# Patient Record
Sex: Male | Born: 1950 | Race: White | Hispanic: No | Marital: Married | Smoking: Never smoker
Health system: Southern US, Community
[De-identification: ages and names within clinical notes are randomized; demographics above are authoritative.]

## PROBLEM LIST (undated history)

## (undated) DIAGNOSIS — E7800 Pure hypercholesterolemia, unspecified: Secondary | ICD-10-CM

## (undated) DIAGNOSIS — C801 Malignant (primary) neoplasm, unspecified: Secondary | ICD-10-CM

## (undated) DIAGNOSIS — I2510 Atherosclerotic heart disease of native coronary artery without angina pectoris: Secondary | ICD-10-CM

## (undated) DIAGNOSIS — G4730 Sleep apnea, unspecified: Secondary | ICD-10-CM

## (undated) DIAGNOSIS — I509 Heart failure, unspecified: Secondary | ICD-10-CM

## (undated) DIAGNOSIS — I639 Cerebral infarction, unspecified: Secondary | ICD-10-CM

## (undated) DIAGNOSIS — E119 Type 2 diabetes mellitus without complications: Secondary | ICD-10-CM

## (undated) DIAGNOSIS — I499 Cardiac arrhythmia, unspecified: Secondary | ICD-10-CM

## (undated) DIAGNOSIS — I10 Essential (primary) hypertension: Secondary | ICD-10-CM

## (undated) DIAGNOSIS — K219 Gastro-esophageal reflux disease without esophagitis: Secondary | ICD-10-CM

## (undated) HISTORY — PX: CHOLECYSTECTOMY: SHX55

## (undated) HISTORY — PX: RENAL CYST EXCISION: SUR506

## (undated) HISTORY — PX: KNEE ARTHROSCOPY: SUR90

## (undated) HISTORY — DX: Cardiac arrhythmia, unspecified: I49.9

## (undated) HISTORY — DX: Sleep apnea, unspecified: G47.30

## (undated) HISTORY — DX: Atherosclerotic heart disease of native coronary artery without angina pectoris: I25.10

---

## 1999-09-20 ENCOUNTER — Encounter: Payer: Self-pay | Admitting: *Deleted

## 1999-09-20 ENCOUNTER — Ambulatory Visit (HOSPITAL_COMMUNITY): Admission: RE | Admit: 1999-09-20 | Discharge: 1999-09-20 | Payer: Self-pay | Admitting: *Deleted

## 2004-03-31 ENCOUNTER — Ambulatory Visit: Payer: Self-pay | Admitting: Urology

## 2004-04-07 ENCOUNTER — Ambulatory Visit: Payer: Self-pay | Admitting: Urology

## 2004-09-05 ENCOUNTER — Ambulatory Visit: Payer: Self-pay | Admitting: Urology

## 2005-03-20 ENCOUNTER — Ambulatory Visit: Payer: Self-pay | Admitting: Urology

## 2005-12-18 ENCOUNTER — Ambulatory Visit: Payer: Self-pay | Admitting: Urology

## 2006-04-04 ENCOUNTER — Ambulatory Visit: Payer: Self-pay | Admitting: Urology

## 2006-10-17 ENCOUNTER — Ambulatory Visit: Payer: Self-pay | Admitting: Gastroenterology

## 2007-09-08 ENCOUNTER — Emergency Department: Payer: Self-pay | Admitting: Unknown Physician Specialty

## 2007-09-08 ENCOUNTER — Other Ambulatory Visit: Payer: Self-pay

## 2007-09-19 ENCOUNTER — Ambulatory Visit: Payer: Self-pay | Admitting: General Practice

## 2008-07-23 ENCOUNTER — Ambulatory Visit: Payer: Self-pay | Admitting: Urology

## 2009-06-22 ENCOUNTER — Ambulatory Visit: Payer: Self-pay | Admitting: Urology

## 2009-06-23 ENCOUNTER — Ambulatory Visit: Payer: Self-pay | Admitting: Urology

## 2010-01-05 ENCOUNTER — Ambulatory Visit: Payer: Self-pay | Admitting: Urology

## 2010-01-18 ENCOUNTER — Ambulatory Visit: Payer: Self-pay | Admitting: Urology

## 2010-01-19 LAB — PATHOLOGY REPORT

## 2010-10-27 ENCOUNTER — Observation Stay: Payer: Self-pay | Admitting: Specialist

## 2010-11-03 ENCOUNTER — Ambulatory Visit: Payer: Self-pay | Admitting: General Practice

## 2012-02-03 ENCOUNTER — Ambulatory Visit: Payer: Self-pay | Admitting: Medical

## 2012-02-28 ENCOUNTER — Ambulatory Visit: Payer: Self-pay | Admitting: General Practice

## 2012-06-29 ENCOUNTER — Emergency Department: Payer: Self-pay | Admitting: Emergency Medicine

## 2013-01-15 ENCOUNTER — Ambulatory Visit: Payer: Self-pay | Admitting: Physician Assistant

## 2013-01-15 LAB — CBC WITH DIFFERENTIAL/PLATELET
Basophil #: 0.1 10*3/uL (ref 0.0–0.1)
Basophil %: 0.9 %
Eosinophil #: 0.2 10*3/uL (ref 0.0–0.7)
Eosinophil %: 3.2 %
HCT: 45.4 % (ref 40.0–52.0)
HGB: 16 g/dL (ref 13.0–18.0)
Lymphocyte #: 1.8 10*3/uL (ref 1.0–3.6)
Lymphocyte %: 24.8 %
MCH: 31.5 pg (ref 26.0–34.0)
MCHC: 35.2 g/dL (ref 32.0–36.0)
MCV: 89 fL (ref 80–100)
Monocyte #: 0.6 x10 3/mm (ref 0.2–1.0)
Monocyte %: 8.2 %
Neutrophil #: 4.5 10*3/uL (ref 1.4–6.5)
Neutrophil %: 62.9 %
Platelet: 166 10*3/uL (ref 150–440)
RBC: 5.08 10*6/uL (ref 4.40–5.90)
RDW: 13.3 % (ref 11.5–14.5)
WBC: 7.1 10*3/uL (ref 3.8–10.6)

## 2013-01-15 LAB — URINALYSIS, COMPLETE
Bacteria: NEGATIVE
Bilirubin,UR: NEGATIVE
Glucose,UR: NEGATIVE mg/dL (ref 0–75)
Ketone: NEGATIVE
Nitrite: NEGATIVE
Ph: 6 (ref 4.5–8.0)
Protein: NEGATIVE
RBC,UR: NONE SEEN /HPF (ref 0–5)
Specific Gravity: 1.005 (ref 1.003–1.030)
Squamous Epithelial: NONE SEEN

## 2013-01-15 LAB — COMPREHENSIVE METABOLIC PANEL
Albumin: 4.3 g/dL (ref 3.4–5.0)
Alkaline Phosphatase: 71 U/L (ref 50–136)
Anion Gap: 11 (ref 7–16)
BUN: 17 mg/dL (ref 7–18)
Bilirubin,Total: 0.5 mg/dL (ref 0.2–1.0)
Calcium, Total: 9 mg/dL (ref 8.5–10.1)
Chloride: 105 mmol/L (ref 98–107)
Co2: 25 mmol/L (ref 21–32)
Creatinine: 1.01 mg/dL (ref 0.60–1.30)
EGFR (African American): >60
EGFR (Non-African Amer.): >60
Glucose: 97 mg/dL (ref 65–99)
Osmolality: 283 (ref 275–301)
Potassium: 3.8 mmol/L (ref 3.5–5.1)
SGOT(AST): 18 U/L (ref 15–37)
SGPT (ALT): 23 U/L (ref 12–78)
Sodium: 141 mmol/L (ref 136–145)
Total Protein: 7.6 g/dL (ref 6.4–8.2)

## 2013-01-17 LAB — URINE CULTURE

## 2013-10-20 ENCOUNTER — Inpatient Hospital Stay: Payer: Self-pay | Admitting: Surgery

## 2013-10-20 LAB — BASIC METABOLIC PANEL
Anion Gap: 7 (ref 7–16)
BUN: 17 mg/dL (ref 7–18)
Calcium, Total: 8.6 mg/dL (ref 8.5–10.1)
Chloride: 104 mmol/L (ref 98–107)
Co2: 27 mmol/L (ref 21–32)
Creatinine: 0.94 mg/dL (ref 0.60–1.30)
EGFR (African American): >60
EGFR (Non-African Amer.): >60
Glucose: 131 mg/dL — ABNORMAL HIGH (ref 65–99)
Osmolality: 279 (ref 275–301)
Potassium: 4.9 mmol/L (ref 3.5–5.1)
Sodium: 138 mmol/L (ref 136–145)

## 2013-10-20 LAB — CBC
HCT: 46.7 % (ref 40.0–52.0)
HGB: 16.4 g/dL (ref 13.0–18.0)
MCH: 32.2 pg (ref 26.0–34.0)
MCHC: 35 g/dL (ref 32.0–36.0)
MCV: 92 fL (ref 80–100)
Platelet: 186 10*3/uL (ref 150–440)
RBC: 5.08 10*6/uL (ref 4.40–5.90)
RDW: 13 % (ref 11.5–14.5)
WBC: 6.7 10*3/uL (ref 3.8–10.6)

## 2013-10-20 LAB — HEPATIC FUNCTION PANEL A (ARMC)
Albumin: 3.7 g/dL (ref 3.4–5.0)
Alkaline Phosphatase: 74 U/L
Bilirubin, Direct: 0.4 mg/dL — ABNORMAL HIGH (ref 0.00–0.20)
Bilirubin,Total: 2.8 mg/dL — ABNORMAL HIGH (ref 0.2–1.0)
SGOT(AST): 221 U/L — ABNORMAL HIGH (ref 15–37)
SGPT (ALT): 141 U/L — ABNORMAL HIGH (ref 12–78)
Total Protein: 7.5 g/dL (ref 6.4–8.2)

## 2013-10-20 LAB — TROPONIN I: Troponin-I: <0.02 ng/mL

## 2013-10-20 LAB — LIPASE, BLOOD: Lipase: 200 U/L (ref 73–393)

## 2013-10-21 LAB — CBC WITH DIFFERENTIAL/PLATELET
BASOS PCT: 0.8 %
Basophil #: 0 10*3/uL (ref 0.0–0.1)
EOS ABS: 0.2 10*3/uL (ref 0.0–0.7)
Eosinophil %: 3.8 %
HCT: 44.2 % (ref 40.0–52.0)
HGB: 15 g/dL (ref 13.0–18.0)
Lymphocyte #: 1 10*3/uL (ref 1.0–3.6)
Lymphocyte %: 19 %
MCH: 31.3 pg (ref 26.0–34.0)
MCHC: 33.9 g/dL (ref 32.0–36.0)
MCV: 92 fL (ref 80–100)
MONO ABS: 0.5 x10 3/mm (ref 0.2–1.0)
Monocyte %: 10.2 %
NEUTROS ABS: 3.4 10*3/uL (ref 1.4–6.5)
Neutrophil %: 66.2 %
PLATELETS: 168 10*3/uL (ref 150–440)
RBC: 4.79 10*6/uL (ref 4.40–5.90)
RDW: 12.9 % (ref 11.5–14.5)
WBC: 5.2 10*3/uL (ref 3.8–10.6)

## 2013-10-21 LAB — COMPREHENSIVE METABOLIC PANEL
ALBUMIN: 3.2 g/dL — AB (ref 3.4–5.0)
ANION GAP: 6 — AB (ref 7–16)
Alkaline Phosphatase: 114 U/L
BILIRUBIN TOTAL: 3.8 mg/dL — AB (ref 0.2–1.0)
BUN: 12 mg/dL (ref 7–18)
CREATININE: 1.1 mg/dL (ref 0.60–1.30)
Calcium, Total: 9.2 mg/dL (ref 8.5–10.1)
Chloride: 105 mmol/L (ref 98–107)
Co2: 30 mmol/L (ref 21–32)
EGFR (Non-African Amer.): >60
Glucose: 107 mg/dL — ABNORMAL HIGH (ref 65–99)
Osmolality: 281 (ref 275–301)
Potassium: 3.8 mmol/L (ref 3.5–5.1)
SGOT(AST): 116 U/L — ABNORMAL HIGH (ref 15–37)
SGPT (ALT): 150 U/L — ABNORMAL HIGH (ref 12–78)
SODIUM: 141 mmol/L (ref 136–145)
TOTAL PROTEIN: 6.7 g/dL (ref 6.4–8.2)

## 2013-10-22 LAB — COMPREHENSIVE METABOLIC PANEL
Albumin: 3.1 g/dL — ABNORMAL LOW (ref 3.4–5.0)
Alkaline Phosphatase: 103 U/L
Anion Gap: 9 (ref 7–16)
BUN: 13 mg/dL (ref 7–18)
Bilirubin,Total: 1.4 mg/dL — ABNORMAL HIGH (ref 0.2–1.0)
CHLORIDE: 104 mmol/L (ref 98–107)
CREATININE: 0.89 mg/dL (ref 0.60–1.30)
Calcium, Total: 9 mg/dL (ref 8.5–10.1)
Co2: 28 mmol/L (ref 21–32)
EGFR (African American): >60
EGFR (Non-African Amer.): >60
Glucose: 93 mg/dL (ref 65–99)
Osmolality: 281 (ref 275–301)
Potassium: 3.7 mmol/L (ref 3.5–5.1)
SGOT(AST): 52 U/L — ABNORMAL HIGH (ref 15–37)
SGPT (ALT): 96 U/L — ABNORMAL HIGH (ref 12–78)
Sodium: 141 mmol/L (ref 136–145)
Total Protein: 6.4 g/dL (ref 6.4–8.2)

## 2013-10-22 LAB — AMYLASE: Amylase: 62 U/L (ref 25–115)

## 2013-10-23 LAB — COMPREHENSIVE METABOLIC PANEL
ALBUMIN: 3 g/dL — AB (ref 3.4–5.0)
AST: 63 U/L — AB (ref 15–37)
Alkaline Phosphatase: 87 U/L
Anion Gap: 6 — ABNORMAL LOW (ref 7–16)
BILIRUBIN TOTAL: 0.8 mg/dL (ref 0.2–1.0)
BUN: 14 mg/dL (ref 7–18)
CO2: 30 mmol/L (ref 21–32)
Calcium, Total: 8.9 mg/dL (ref 8.5–10.1)
Chloride: 103 mmol/L (ref 98–107)
Creatinine: 0.95 mg/dL (ref 0.60–1.30)
EGFR (African American): >60
GLUCOSE: 127 mg/dL — AB (ref 65–99)
Osmolality: 280 (ref 275–301)
Potassium: 4 mmol/L (ref 3.5–5.1)
SGPT (ALT): 96 U/L — ABNORMAL HIGH (ref 12–78)
SODIUM: 139 mmol/L (ref 136–145)
TOTAL PROTEIN: 6.2 g/dL — AB (ref 6.4–8.2)

## 2013-10-23 LAB — CBC WITH DIFFERENTIAL/PLATELET
Basophil #: 0 10*3/uL (ref 0.0–0.1)
Basophil %: 0 %
EOS PCT: 0 %
Eosinophil #: 0 10*3/uL (ref 0.0–0.7)
HCT: 41.5 % (ref 40.0–52.0)
HGB: 14.4 g/dL (ref 13.0–18.0)
LYMPHS ABS: 0.8 10*3/uL — AB (ref 1.0–3.6)
Lymphocyte %: 8.6 %
MCH: 31.9 pg (ref 26.0–34.0)
MCHC: 34.6 g/dL (ref 32.0–36.0)
MCV: 92 fL (ref 80–100)
Monocyte #: 0.7 x10 3/mm (ref 0.2–1.0)
Monocyte %: 7.3 %
Neutrophil #: 7.9 10*3/uL — ABNORMAL HIGH (ref 1.4–6.5)
Neutrophil %: 84.1 %
PLATELETS: 174 10*3/uL (ref 150–440)
RBC: 4.5 10*6/uL (ref 4.40–5.90)
RDW: 13.1 % (ref 11.5–14.5)
WBC: 9.4 10*3/uL (ref 3.8–10.6)

## 2013-10-23 LAB — AMYLASE: Amylase: 40 U/L (ref 25–115)

## 2013-10-23 LAB — PATHOLOGY REPORT

## 2013-12-22 ENCOUNTER — Ambulatory Visit: Payer: Self-pay | Admitting: Oncology

## 2013-12-22 DIAGNOSIS — C801 Malignant (primary) neoplasm, unspecified: Secondary | ICD-10-CM | POA: Insufficient documentation

## 2013-12-22 DIAGNOSIS — N400 Enlarged prostate without lower urinary tract symptoms: Secondary | ICD-10-CM | POA: Insufficient documentation

## 2013-12-22 DIAGNOSIS — N281 Cyst of kidney, acquired: Secondary | ICD-10-CM | POA: Insufficient documentation

## 2013-12-22 DIAGNOSIS — D408 Neoplasm of uncertain behavior of other specified male genital organs: Secondary | ICD-10-CM | POA: Insufficient documentation

## 2013-12-22 DIAGNOSIS — N401 Enlarged prostate with lower urinary tract symptoms: Secondary | ICD-10-CM | POA: Insufficient documentation

## 2013-12-22 DIAGNOSIS — Z87442 Personal history of urinary calculi: Secondary | ICD-10-CM | POA: Insufficient documentation

## 2014-01-11 DIAGNOSIS — C609 Malignant neoplasm of penis, unspecified: Secondary | ICD-10-CM | POA: Insufficient documentation

## 2014-08-22 NOTE — Consult Note (Signed)
PATIENT NAME:  Jermaine Kramer, Jermaine Kramer MR#:  [id number] DATE OF BIRTH:  05-26-1950  DATE OF CONSULTATION:  10/21/2013  REFERRING PHYSICIAN:  Dr. Leanora Cover CONSULTING PHYSICIAN:  Lucilla Lame, MD / Andria Meuse, NP  PRIMARY CARE PHYSICIAN: Dr. Burt Ek   REASON FOR CONSULTATION: Suspected choledocholithiasis.   HISTORY OF PRESENT ILLNESS: Jermaine Kramer is a 64 year old Caucasian male who was in his usual state of health until he developed severe right upper quadrant pain, which radiated across his stomach, 2 days ago. This was not associated with nausea or vomiting, however, the pain was in his upper abdomen, radiated to his lower entire abdomen. Pain was 9 out of 10 on pain scale. He did have a large loose bowel movement and the pain seemed to go away after that. He had a CT scan of abdomen and pelvis with contrast yesterday which showed a common bile duct at 4 mm, large gallstones up to 3 cm in diameter, a 2 mm nonobstructing right kidney calculus,  large prostate, small hiatal hernia, low-grade stranding of the central mesentery and atherosclerosis. His liver function tests were normal in September 2014. His total bilirubin went from 2.8 yesterday to 3.2 today, alkaline phosphatase from 74 to 114, AST from 221 down to 116 and ALT from 141 up to 150. He had a normal CBC and negative troponin. He denies any fever, chills. His weight has been stable and his appetite has been fine up until this acute illness. He denies any clay-colored stools, jaundice, pruritus or rectal bleeding or melena.   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 He had a colonoscopy by Dr. Allen Norris where he had colitis at the ileocecal valve with normal biopsies and hyperplastic polyps in June of 2008 by Dr. Allen Norris. He has had a Port-A-Cath, which has since been removed.   MEDICATIONS PRIOR TO ADMISSION: Aspirin 325 mg daily,  hydrochlorothiazide/lisinopril 12.5/20 mg daily, Klor-Con M10 once daily, metformin 1 gram b.i.d., Onglyza 2.5 mg daily, pravastatin 20 mg at bedtime, Prilosec 20 mg daily.   ALLERGIES: No known drug allergies.   FAMILY HISTORY: There is no known family history of chronic GI problems or liver disease. No family history of colorectal carcinoma.   SOCIAL HISTORY: He is retired from [employer]. He has 2 daughters and 1 son all of whom are healthy. He is married. He denies any tobacco, alcohol or illicit drug use.   REVIEW OF SYSTEMS: See HPI, otherwise negative 12 point review of systems.  PHYSICAL EXAMINATION: VITAL SIGNS: Temperature 97.8, pulse 71, respirations 17, blood pressure 120/78, O2 sat 95% on room air.  GENERAL: He is a well-developed, well-nourished Caucasian male who is alert, oriented, pleasant, and cooperative, in no acute distress. His wife is at the bedside.  HEENT: Sclerae with mild icterus. Conjunctivae pale. Oropharynx pink and moist without any lesions.  NECK: Supple without mass or thyromegaly.  CHEST: Heart regular rate and rhythm. Normal S1, S2. without any murmurs, clicks, rubs, or gallops.  LUNGS: Clear to auscultation bilaterally.  ABDOMEN: Protuberant. Positive bowel sounds x4. No bruits auscultated. Abdomen is soft, nontender, nondistended, without palpable mass or hepatosplenomegaly. No rebound tenderness or guarding. Negative Murphy's sign.  EXTREMITIES: Without clubbing or edema bilaterally.  PSYCHIATRIC: Alert, cooperative, normal mood and affect.  NEUROLOGIC: Grossly intact.  MUSCULOSKELETAL: Good equal movement and strength bilaterally.  SKIN: Pale, warm and dry.  LABORATORY STUDIES: See HPI.  IMPRESSION: Jermaine Kramer is a very pleasant 64 year old Caucasian male admitted with biliary colic and found to have multiple large gallstones. His bilirubin has increased to 3.2 today as well as his alkaline phosphatase so there is concern  for choledocholithiasis. He may have intermittent obstruction from large cholelithiasis in his gallbladder. MRCP is pending.   PLAN: 1.  If MRCP is positive revealing dilation or stone, ERCP with Dr. Allen Norris in the near future. I have discussed risks and benefits of procedure which include but are not limited to bleeding, infection, perforation, drug reaction, and pancreatitis. He agrees with this plan and consent will be obtained if MRI is abnormal.  2.  Continue supportive measures.  3.  Eventual cholecystectomy per surgery.   Thank you for allowing [place] to participate in his care. ____________________________ Andria Meuse, NP klj:sb D: 10/21/2013 14:01:32 ET T: 10/21/2013 14:19:00 ET JOB#: [id number]  cc: Andria Meuse, NP, <Dictator> Nelda Severe. Burt Ek, [place] SIGNED 10/31/2013 16:47

## 2014-08-22 NOTE — Op Note (Signed)
PATIENT NAME:  Jermaine Kramer, Jermaine Kramer MR#:  [id number] DATE OF BIRTH:  1951/01/06  DATE OF PROCEDURE:  10/22/2013  PREOPERATIVE DIAGNOSIS: Symptomatic cholelithiasis (passed common bile duct stone).   POSTOPERATIVE DIAGNOSIS: Symptomatic cholelithiasis (passed common bile duct stone).   SURGEON: Consuela Mimes, M.D.   ANESTHESIA: General.   OPERATION PERFORMED: Laparoscopic cholecystectomy with intraoperative cholangiogram.   COMPLICATION: Veress needle puncture of small bowel mesentery.   PROCEDURE IN DETAIL: The patient was placed supine on the operating room table and prepped and draped in the usual sterile fashion. A 15 mmHg CO2 pneumoperitoneum was created with the Veress needle in the supraumbilical position, and this was replaced with a 5 mm trocar and a 30-degree angled laparoscope. Remaining trocars were placed under direct visualization. There was a small amount of blood under the umbilical trocar site in the omentum, and the omentum was pulled back and it was apparent that the Veress needle had initially penetrated the omentum and a full-thickness single layer of small bowel mesentery. The amount of blood was only about 1 or 2 mL, but there was a small amount of CO2 that had been insufflated into the mesentery and it came around one of the small intestinal loops just where the mesentery met the small intestine itself. There was no evidence of succus entericus, and I investigated this area by moving the loop back and forth and looking at the lower portion of small bowel mesenteric root, and the injury appeared to be minor in that there was no actual intestinal injury and no hematoma formation and the bleeding was minimal and not active. Therefore, the omentum was placed back over top of this area of small bowel mesentery and small bowel and the procedure was continued laparoscopically. There were multiple adhesions from the parietal surface of the liver and the diaphragm and anterior abdominal  wall from the patient's previous right kidney surgery, and these were taken down with the electrocautery so that the liver could be mobilized enough to mobilize the gallbladder for visualization. The fundus of the gallbladder was then retracted superiorly and ventrally, and the infundibulum of the gallbladder was dissected out from surrounding fatty tissue near the triangle of Calot, and a posterior cystic artery was doubly clipped and divided and the anterior cystic artery was then doubly clipped and divided and a single clip was placed on the gallbladder and cystic duct junction and a cystic ductotomy was performed through which a Reddick catheter was placed and an intraoperative cholangiogram was performed under fluoroscopy. Although there was some leakage of contrast through the cystic duct past the balloon, there was contrast seen in the intrahepatic ductal system, the common hepatic duct and the common bile duct all the way down to a normal sphincter of Oddi and little bit of wisps of contrast could be seen entering the duodenum. The entire biliary tree was normal with no evidence of filling defects and was also normal in caliber. Therefore, it was felt that the patient likely had passed a common bile duct stone the day prior to admission to the hospital which was 2 days ago. The cholangiogram catheter was removed, and the cystic duct stump was closed with 2 firings of the hemoclip device, and then the gallbladder was removed from the liver bed with the electrocautery. There was an additional posterior cystic artery that was very close to the liver bed, and this was controlled with 2 additional hemoclips and the gallbladder was then placed in an Endo Catch bag and  extracted from the abdomen via the epigastric midline port. This port site fascia required significant enlargement due to the size of the largest gallstone. The peritoneum was temporarily desufflated while I closed the linea alba in the epigastrium  with a running 0 PDS suture, and then the peritoneum was reinsufflated and reinspected and the right upper quadrant was irrigated with copious amounts of warm normal saline, all of which was suctioned completely clear. From the patient's previous right kidney surgery, he either had a true hernia or what was more likely a denervation and eventration of the abdominal wall in that area. All of the effluent was suctioned clear, and the area of dissection was reinspected and the clips on the cystic duct were secure, as were the ones on the various branches of the cystic artery. There was no evidence of bleeding, and, therefore, the peritoneum was desufflated and decannulated, and all 4 skin sites were closed with subcuticular 5-0 Monocryl and suture strips. The patient tolerated the procedure well and the complication was minimal and inconsequential and is mentioned in the heading of this operative report.    ____________________________ Consuela Mimes, MD wfm:gb D: 10/22/2013 12:37:16 ET T: 10/23/2013 00:20:01 ET JOB#: [id number]  cc: Consuela Mimes, MD, <Dictator> Consuela Mimes MD ELECTRONICALLY SIGNED 10/23/2013 7:38

## 2014-08-22 NOTE — H&P (Signed)
Subjective/Chief Complaint RUQ pain   History of Present Illness 24 hrs of ruq pain after fatty meal. first episode n/v,  no f/c pain now resolved no jaundice, had loose stools   Past History PSH port, rt kidney cyst drainage PMH AML, CVA ?, HTN DM   Past Medical Health Hypertension, Diabetes Mellitus, Cancer, Stroke   Past Med/Surgical Hx:  Sleep Apnea:   Kidney Cyst:   Kidney Stones:   Leukemia:   Diabetes Mellitus, Type II (NIDD):   GERD - Esophageal Reflux:   Hypertension:   Knee surgery:   CYST REMOVED FROM RIGHT KIDNEY:   ALLERGIES:  No Known Allergies:   Family and Social History:  Family History Non-Contributory   Social History negative tobacco, negative ETOH, retired [employer]   Review of Systems:  Fever/Chills No   Cough No   Abdominal Pain Yes   Diarrhea Yes   Constipation No   Nausea/Vomiting Yes   SOB/DOE No   Chest Pain No   Dysuria No   Tolerating Diet No  Nauseated   Medications/Allergies Reviewed Medications/Allergies reviewed   Physical Exam:  GEN no acute distress, obese   HEENT pink conjunctivae   NECK supple   RESP normal resp effort  clear BS  no use of accessory muscles   CARD regular rate   ABD positive tenderness  positive hernia  UH, reducible nontender; min RUQ tenderness   LYMPH negative neck   EXTR negative edema   SKIN normal to palpation   PSYCH alert, A+O to time, place, person, good insight   Lab Results: Hepatic:  21-Jun-15 23:25   Bilirubin, Total  2.8  Bilirubin, Direct  0.40  Alkaline Phosphatase 74 (45-117 NOTE: New Reference Range 03/21/13)  SGPT (ALT)  141  SGOT (AST)  221  Total Protein, Serum 7.5  Albumin, Serum 3.7  Routine Chem:  21-Jun-15 23:25   Result Comment AST/DIRECT BILIRUBIN - Slight hemolysis, interpret results with  - caution.  Result(s) reported on 20 Oct 2013 at 01:46AM.  Result Comment POTASSIUM - Slight hemolysis, interpret  results with  - caution.  Result(s) reported on 20 Oct 2013 at 12:08AM.  Lipase 200 (Result(s) reported on 20 Oct 2013 at 01:41AM.)  Glucose, Serum  131  BUN 17  Creatinine (comp) 0.94  Sodium, Serum 138  Potassium, Serum 4.9  Chloride, Serum 104  CO2, Serum 27  Calcium (Total), Serum 8.6  Anion Gap 7  Osmolality (calc) 279  eGFR (African American) >60  eGFR (Non-African American) >60 (eGFR values <58m/min/1.73 m2 may be an indication of chronic kidney disease (CKD). Calculated eGFR is useful in patients with stable renal function. The eGFR calculation will not be reliable in acutely ill patients when serum creatinine is changing rapidly. It is not useful in  patients on dialysis. The eGFR calculation may not be applicable to patients at the low and high extremes of body sizes, pregnant women, and vegetarians.)  Cardiac:  21-Jun-15 23:25   Troponin I < 0.02 (0.00-0.05 0.05 ng/mL or less: NEGATIVE  Repeat testing in 3-6 hrs  if clinically indicated. >0.05 ng/mL: POTENTIAL  MYOCARDIAL INJURY. Repeat  testing in 3-6 hrs if  clinically indicated. NOTE: An increase or decrease  of 30% or more on serial  testing suggests a  clinically important change)  Routine Hem:  21-Jun-15 23:25   WBC (CBC) 6.7  RBC (CBC) 5.08  Hemoglobin (CBC) 16.4  Hematocrit (CBC) 46.7  Platelet Count (CBC)  186 (Result(s) reported on 20 Oct 2013 at 12:00AM.)  MCV 92  MCH 32.2  MCHC 35.0  RDW 13.0   Radiology Results: [place]:    22-Jun-15 04:01, US Abdomen Limited Survey  US Abdomen Limited Survey  REASON FOR EXAM:    abd pain, transaminitis  COMMENTS:   Body Site: Gallbladder, Liver, Common Bile Duct    PROCEDURE: [place]  - US ABDOMEN LIMITED SURVEY  - Oct 20 2013  4:01AM     CLINICAL DATA:  Abdominal pain.  Transaminitis.    EXAM:  US ABDOMEN LIMITED - RIGHT UPPER QUADRANT    COMPARISON:  CT abdomen and pelvis 10/20/2013    FINDINGS:  Gallbladder:  Multiple stones in the gallbladder,  largest measuring 2.3 cm. No  gallbladder wall thickening. Murphy's sign is negative.    Common bile duct:    Diameter: 3.7 mm, normal    Liver:    Mild diffusely increased hepatic parenchymal echotexture suggesting  mild diffuse fatty infiltration. No focal liver lesions are  identified.     IMPRESSION:  Cholelithiasis without signs of cholecystitis.  Electronically Signed    By: Lucienne Capers M.D.    On: 10/20/2013 04:03         Verified By: Neale Burly, M.D.,  CT:    22-Jun-15 02:08, CT Abdomen and Pelvis With Contrast  CT Abdomen and Pelvis With Contrast  REASON FOR EXAM:    (1) crampy abd pain and diarrhea; (2) crampy abd pain   and diarrhea  COMMENTS:   May transport without cardiac monitor    PROCEDURE: CT  - CT ABDOMEN / PELVIS  W  - Oct 20 2013  2:08AM     CLINICAL DATA:  Epigastric pain.  Diarrhea.    EXAM:  CT ABDOMEN AND PELVIS WITH CONTRAST    TECHNIQUE:  Multidetector CT imaging of the abdomen and pelvis was performed  using the standard protocol following bolus administration of  intravenous contrast.  CONTRAST:  100 cc Isovue 300    COMPARISON:  10/19/2013    FINDINGS:  Subsegmental atelectasis noted in the posterior basal segments of  both lower lobes.    Small hiatal hernia. Larger gallstones measuring up to 3 cm in  diameter.    Screen and pancreas unremarkable. Fullness of the medial limb of the  right adrenal gland measuring up to 9 mm in thickness.    Bilateral upper pole renal cysts. 2 mm nonobstructive right kidney  lower pole calculus. No ureteral or bladder calculus.    Low grade edema in the central mesentery. Appendix normal. No  pathologic upper abdominal adenopathy is observed. No pathologic  pelvic adenopathy is observed. Aortoiliac atherosclerotic vascular  disease. Enlarged prostate gland, 5.5 by 4.9 cm.    No dilated bowel.  The colon is relatively empty.     IMPRESSION:  1. Large gallstones.  2. 2 mm  nonobstructive right kidney lower pole calculus.  3. Low grade stranding in the central mesentery, as referenced on  the prior CT scan from 2011, probably postinflammatory.  4. Atherosclerosis.  5. Enlarged prostate gland.  6. Small hiatal hernia.      Electronically Signed    By: Sherryl Barters M.D.    On: 10/20/2013 02:20         Verified By: Carron Curie, M.D.,    Assessment/Admission Diagnosis choledocholith with DM rec admit, IV abx, recheck labs lap chole with grams will discuss timing with Dr Leanora Cover   Electronic Signatures:  [name] (MD)  (Signed 22-Jun-15 06:19)  Authored: CHIEF COMPLAINT and HISTORY, PAST MEDICAL/SURGIAL HISTORY, ALLERGIES, FAMILY AND SOCIAL HISTORY, REVIEW OF SYSTEMS, PHYSICAL EXAM, LABS, Radiology, ASSESSMENT AND PLAN   Last Updated: 22-Jun-15 06:19 by [name] (MD)

## 2014-08-22 NOTE — Consult Note (Signed)
Brief Consult Note: Diagnosis: suspected choledocholithiasis.   Patient was seen by consultant.   Comments: Jermaine Kramer is a very pleasant 64 y/o caucasian male admitted with biliary colic found to have multiple large gallstones.  His bilirubin has increased to 3.2 today as well as his alkaline phosphatase so there is concern for choledocholithiasis.  He may have intermittent obstruction from large cholelithiasis in his gallbladder.  MRCP is pending.  Plan: 1) If MRCP positive for biliary dilation or stone, ERCP with Dr Allen Norris.  Discussed risks/benefits of procedure which include but are not limited to pancreatitis, bleeding, infection, perforation & drug reaction.  Patient agrees with this plan & consent will be obtained. 2) Continue supportive measures 3) eventual cholecystectomy per surgery Thanks for allowing [place] to participate in his care.  Please see full dictated note. #[id number].  Electronic Signatures: Andria Meuse (NP)  (Signed 23-Jun-15 14:01)  Authored: Brief Consult Note   Last Updated: 23-Jun-15 14:01 by Andria Meuse (NP)

## 2014-08-22 NOTE — Discharge Summary (Signed)
PATIENT NAME:  Jermaine Kramer, Jermaine Kramer MR#:  [id number] DATE OF BIRTH:  1951-04-02  DATE OF ADMISSION:  10/20/2013 DATE OF DISCHARGE:  10/23/2013  PRINCIPLE DIAGNOSIS: Symptomatic cholelithiasis (likely passed common bile duct stone).   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   HOSPITAL COURSE: The patient was admitted to the hospital and had a rising bilirubin and abnormal hepatic transaminases, but he had a gallbladder with multiple stones, the largest being 23 mm with no gallbladder wall thickening or pericholecystic fluid on ultrasound and a normal size common bile duct at 4 mm. He underwent M.R.C.P. which was essentially normal, and the following morning his bilirubin dropped from a high of 3.8 down to 1.4, and his SGOT and SGPT dropped as well and, therefore, he was taken to the Operating Room where he underwent laparoscopic cholecystectomy with intraoperative cholangiogram. The cholangiogram was essentially normal. The following day, he was afebrile with stable vital signs and had slept well. He was having minimal abdominal pain that was controlled with Norco. He was eating. His abdomen was protuberant and obese, but at baseline and was completely nontender and nondistended and, therefore, he was discharged home with a prescription for Norco and instructed to call for fever or increased abdominal pain or other gastrointestinal symptoms and, otherwise, to see me in the office in 1-2 weeks.    ____________________________ Consuela Mimes, MD wfm:ts D: 10/23/2013 07:35:24 ET T: 10/23/2013 13:24:54 ET JOB#: [id number]  cc: Consuela Mimes, MD, <Dictator> Consuela Mimes MD ELECTRONICALLY SIGNED 10/24/2013 9:37

## 2014-08-22 NOTE — H&P (Signed)
PATIENT NAME:  Jermaine Kramer, Jermaine Kramer MR#:  [id number] DATE OF BIRTH:  1950-07-01  DATE OF ADMISSION:  10/19/2013  CHIEF COMPLAINT: Right upper quadrant pain.  HISTORY OF PRESENT ILLNESS: This is a patient with approximately 18 hours of abdominal pain that started after lunch yesterday, He has never had an episode like this before. He had a fatty meal at lunch pain and that is when his pain started. His pain has completely resolved at this time, but a work-up showed elevated liver function tests suggestive of choledocholithiasis and he has been being admitted because of his elevated liver function tests and the possibility of cholangitis. He denies jaundice or acholic stools. He has had some loose diarrheal-like stools. Denies fevers or chills. Had some nausea.   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 The stroke if preceded that.   PAST SURGICAL HISTORY: Right kidney cyst drainage, port placement and removal. Denies any abdominal surgery otherwise.   FAMILY HISTORY: Noncontributory.   SOCIAL HISTORY: The patient is retired Loss adjuster, chartered. Does not smoke nor drink.   REVIEW OF SYSTEMS: A 10 system review is performed and negative with the exception of that mentioned in the HPI.   ALLERGIES: None.   MEDICATIONS: Multiple, see reconciliation.   PHYSICAL EXAMINATION:  GENERAL: Healthy, comfortable, obese male patient.  VITAL SIGNS: Temperature of 98.5, pulse 77, respirations 18, blood pressure 137/80, 96% room air sat. He had a pain scale of 8 and is now pain scale of 0.  HEENT: No scleral icterus.  NECK: No palpable neck nodes.  CHEST: Clear to auscultation.  CARDIAC: Regular rate and rhythm.  ABDOMEN: Protuberant, soft. There is a visible umbilical hernia, which is reducible, soft and nontender and non-erythematous. He is minimally tender in the right upper  quadrant with a negative Murphy's sign.  EXTREMITIES: Without edema.  NEUROLOGIC: Grossly intact.  INTEGUMENT: No jaundice. Scars are noted in the left subclavian and a right IJ site from prior port.   An ultrasound shows stones with no sign of acute cholecystitis. CT scan shows the same. White blood cell count is 6.7, hemoglobin and hematocrit is 16 and 47 and platelet count of 186. Liver function tests are elevated with a bilirubin of 2.8 and AST and ALT of 221 and 141 with a lipase of 200. Electrolytes are otherwise within normal limits.   ASSESSMENT AND PLAN: This is a patient with right upper quadrant pain, which has resolved, but he has diabetes and other medical problems with elevated liver function tests and gallstones. I recommended admission to the hospital because of his risk for cholangitis. I discussed with him the options of observation or being seen as an outpatient. He prefers to be admitted and have a cholecystectomy at this point. I discussed with him the timing of an operation may be based on rechecking his liver function tests and that I would leave the timing of this up to Dr. Leanora Cover, who would be performing the operation. Consent will be obtained by Dr. Leanora Cover. The rationale for this has been discussed. He and his family understood and agreed to proceed.   ____________________________ Jerrol Banana Burt Knack, MD rec:aw D: 10/20/2013 94:49:67 ET T: 10/20/2013 06:33:46 ET JOB#: [id number]  cc: Jerrol Banana. Burt Knack, MD, <Dictator> [name] MD ELECTRONICALLY SIGNED 10/27/2013 7:34

## 2017-04-28 ENCOUNTER — Encounter: Payer: Self-pay | Admitting: Gynecology

## 2017-04-28 ENCOUNTER — Ambulatory Visit
Admission: EM | Admit: 2017-04-28 | Discharge: 2017-04-28 | Disposition: A | Discharge status: Home / Self Care | Payer: Medicare Other | Attending: Family Medicine | Admitting: Family Medicine

## 2017-04-28 ENCOUNTER — Other Ambulatory Visit: Payer: Self-pay

## 2017-04-28 ENCOUNTER — Ambulatory Visit: Payer: Medicare Other

## 2017-04-28 DIAGNOSIS — W19XXXA Unspecified fall, initial encounter: Secondary | ICD-10-CM

## 2017-04-28 DIAGNOSIS — E119 Type 2 diabetes mellitus without complications: Secondary | ICD-10-CM | POA: Diagnosis not present

## 2017-04-28 DIAGNOSIS — W010XXA Fall on same level from slipping, tripping and stumbling without subsequent striking against object, initial encounter: Secondary | ICD-10-CM | POA: Insufficient documentation

## 2017-04-28 DIAGNOSIS — Z79899 Other long term (current) drug therapy: Secondary | ICD-10-CM | POA: Insufficient documentation

## 2017-04-28 DIAGNOSIS — E7800 Pure hypercholesterolemia, unspecified: Secondary | ICD-10-CM | POA: Diagnosis not present

## 2017-04-28 DIAGNOSIS — I10 Essential (primary) hypertension: Secondary | ICD-10-CM | POA: Insufficient documentation

## 2017-04-28 DIAGNOSIS — S40021A Contusion of right upper arm, initial encounter: Secondary | ICD-10-CM | POA: Diagnosis not present

## 2017-04-28 DIAGNOSIS — Z7984 Long term (current) use of oral hypoglycemic drugs: Secondary | ICD-10-CM | POA: Diagnosis not present

## 2017-04-28 DIAGNOSIS — M79621 Pain in right upper arm: Secondary | ICD-10-CM | POA: Diagnosis not present

## 2017-04-28 DIAGNOSIS — R0781 Pleurodynia: Secondary | ICD-10-CM | POA: Insufficient documentation

## 2017-04-28 DIAGNOSIS — S2241XA Multiple fractures of ribs, right side, initial encounter for closed fracture: Secondary | ICD-10-CM | POA: Insufficient documentation

## 2017-04-28 DIAGNOSIS — Z7982 Long term (current) use of aspirin: Secondary | ICD-10-CM | POA: Insufficient documentation

## 2017-04-28 HISTORY — DX: Essential (primary) hypertension: I10

## 2017-04-28 HISTORY — DX: Type 2 diabetes mellitus without complications: E11.9

## 2017-04-28 HISTORY — DX: Pure hypercholesterolemia, unspecified: E78.00

## 2017-04-28 MED ORDER — HYDROCODONE-ACETAMINOPHEN 5-325 MG PO TABS: ORAL_TABLET | ORAL | 0 refills | Status: DC

## 2017-04-28 NOTE — ED Triage Notes (Signed)
Patient c/o slipped at home in his bath tub and injury right side abdomen and right arm. Per patient now with right side rib pain.

## 2017-04-28 NOTE — ED Provider Notes (Signed)
[facility]    CSN: [id number] Arrival date & time: 04/28/17  0801     History   Chief Complaint Chief Complaint  Patient presents with  . Fall    HPI Jermaine Kramer is a 66 y.o. male.   66 yo male with a c/o right rib pain and right upper arm pain after slipping in bathtub at home and falling 2 days ago. Denies shortness of breath, fevers, chills, numbness.      Past Medical History:  Diagnosis Date  . Diabetes mellitus without complication ([place])   . Hypercholesteremia   . Hypertension     There are no active problems to display for this patient.   History reviewed. No pertinent surgical history.     Home Medications    Prior to Admission medications   Medication Sig Start Date End Date Taking? Authorizing Provider  aspirin EC 81 MG tablet Take 81 mg by mouth daily.   Yes [provider]  atorvastatin (LIPITOR) 40 MG tablet Take 40 mg by mouth daily.   Yes [provider]  glipiZIDE (GLUCOTROL) 5 MG tablet Take by mouth daily before breakfast.   Yes [provider]  lisinopril-hydrochlorothiazide (PRINZIDE,ZESTORETIC) 20-12.5 MG tablet Take by mouth daily.   Yes [provider]  metFORMIN (GLUCOPHAGE) 1000 MG tablet Take 1,000 mg by mouth 2 (two) times daily with a meal.   Yes [provider]  potassium chloride (K-DUR) 10 MEQ tablet Take 10 mEq by mouth daily.   Yes [provider]  HYDROcodone-acetaminophen (NORCO/VICODIN) 5-325 MG tablet 1-2 tabs po q 8 hours prn 04/28/17   Norval Gable, MD    Family History Family History  Problem Relation Age of Onset  . Parkinson's disease Mother   . Lupus Mother   . Heart disease Father     Social History Social History   Tobacco Use  . Smoking status: Never Smoker  . Smokeless tobacco: Never Used  Substance Use Topics  . Alcohol use: Yes  . Drug use: No     Allergies   Patient has no known allergies.   Review of Systems Review of  Systems   Physical Exam Triage Vital Signs ED Triage Vitals  Enc Vitals Group     BP 04/28/17 0824 125/79     Pulse Rate 04/28/17 0824 71     Resp 04/28/17 0824 16     Temp 04/28/17 0824 97.8 F (36.6 C)     Temp Source 04/28/17 0824 Oral     SpO2 04/28/17 0824 98 %     Weight 04/28/17 0826 165 lb (74.8 kg)     Height 04/28/17 0826 5\' 4"  (1.626 m)     Head Circumference --      Peak Flow --      Pain Score 04/28/17 0826 9     Pain Loc --      Pain Edu? --      Excl. in [place]? --    No data found.  Updated Vital Signs BP 125/79 (BP Location: Left Arm)   Pulse 71   Temp 97.8 F (36.6 C) (Oral)   Resp 16   Ht 5\' 4"  (1.626 m)   Wt 165 lb (74.8 kg)   SpO2 98%   BMI 28.32 kg/m   Visual Acuity Right Eye Distance:   Left Eye Distance:   Bilateral Distance:    Right Eye Near:   Left Eye Near:    Bilateral Near:  Physical Exam  Constitutional: He appears well-developed and well-nourished. No distress.  Cardiovascular: Normal rate, regular rhythm and normal heart sounds.  Pulmonary/Chest: Effort normal and breath sounds normal. No stridor. No respiratory distress. He has no wheezes. He has no rales. He exhibits tenderness (right mid ribs).  Musculoskeletal:       Right shoulder: He exhibits decreased range of motion and tenderness (over right upper arm (humerus)). He exhibits no swelling, no effusion, no crepitus, no deformity, no laceration and normal pulse.  Skin: He is not diaphoretic.  Nursing note and vitals reviewed.    UC Treatments / Results  Labs (all labs ordered are listed, but only abnormal results are displayed) Labs Reviewed - No data to display  EKG  EKG Interpretation None       Radiology Dg Ribs Unilateral W/chest Right  Result Date: 04/28/2017 CLINICAL DATA:  Status post fall with right rib pain. EXAM: RIGHT RIBS AND CHEST - 3+ VIEW COMPARISON:  October 27, 2010 FINDINGS: There is mild displaced fracture of the lateral right sixth and  seventh ribs. There is no evidence of pneumothorax or pleural effusion. Both lungs are clear. Heart size and mediastinal contours are within normal limits. IMPRESSION: Mild displaced fractures of the lateral right sixth and seventh ribs. Electronically Signed   By: Abelardo Diesel M.D.   On: 04/28/2017 09:39   Dg Humerus Right  Result Date: 04/28/2017 CLINICAL DATA:  Status post fall with right arm pain. EXAM: RIGHT HUMERUS - 2+ VIEW COMPARISON:  None. FINDINGS: There is no evidence of fracture or dislocation. Bone island is identified in the proximal right humerus. Soft tissues are unremarkable. IMPRESSION: No acute fracture or dislocation. Electronically Signed   By: Abelardo Diesel M.D.   On: 04/28/2017 09:36    Procedures Procedures (including critical care time)  Medications Ordered in UC Medications - No data to display   Initial Impression / Assessment and Plan / UC Course  I have reviewed the triage vital signs and the nursing notes.  Pertinent labs & imaging results that were available during my care of the patient were reviewed by me and considered in my medical decision making (see chart for details).       Final Clinical Impressions(s) / UC Diagnoses   Final diagnoses:  Fracture of two ribs, right, closed, initial encounter  Contusion of right upper arm, initial encounter    ED Discharge Orders        Ordered    HYDROcodone-acetaminophen (NORCO/VICODIN) 5-325 MG tablet     04/28/17 0947     1. x-ray results and diagnosis reviewed with patient 2. rx as per orders above; reviewed possible side effects, interactions, risks and benefits  3. Recommend supportive treatment with rest, ice, otc analgesics prn  4. Follow-up prn if symptoms worsen or don't improve   Controlled Substance Prescriptions  Controlled Substance Registry consulted? Not Applicable   Norval Gable, MD 04/28/17 [phone]

## 2017-05-14 DIAGNOSIS — E66811 Obesity, class 1: Secondary | ICD-10-CM | POA: Insufficient documentation

## 2017-05-14 DIAGNOSIS — E669 Obesity, unspecified: Secondary | ICD-10-CM | POA: Insufficient documentation

## 2017-05-15 ENCOUNTER — Other Ambulatory Visit: Payer: Self-pay | Admitting: Orthopedic Surgery

## 2017-05-15 DIAGNOSIS — M25311 Other instability, right shoulder: Secondary | ICD-10-CM

## 2017-05-15 DIAGNOSIS — S46001A Unspecified injury of muscle(s) and tendon(s) of the rotator cuff of right shoulder, initial encounter: Secondary | ICD-10-CM

## 2017-05-23 ENCOUNTER — Ambulatory Visit
Admission: RE | Admit: 2017-05-23 | Discharge: 2017-05-23 | Disposition: A | Discharge status: Home / Self Care | Payer: Medicare Other | Source: Ambulatory Visit | Attending: Orthopedic Surgery | Admitting: Orthopedic Surgery

## 2017-05-23 DIAGNOSIS — W19XXXA Unspecified fall, initial encounter: Secondary | ICD-10-CM | POA: Insufficient documentation

## 2017-05-23 DIAGNOSIS — M25411 Effusion, right shoulder: Secondary | ICD-10-CM | POA: Insufficient documentation

## 2017-05-23 DIAGNOSIS — M25311 Other instability, right shoulder: Secondary | ICD-10-CM | POA: Insufficient documentation

## 2017-05-23 DIAGNOSIS — S46001A Unspecified injury of muscle(s) and tendon(s) of the rotator cuff of right shoulder, initial encounter: Secondary | ICD-10-CM | POA: Diagnosis present

## 2017-05-23 DIAGNOSIS — S46011A Strain of muscle(s) and tendon(s) of the rotator cuff of right shoulder, initial encounter: Secondary | ICD-10-CM | POA: Diagnosis not present

## 2017-05-23 DIAGNOSIS — S46021A Laceration of muscle(s) and tendon(s) of the rotator cuff of right shoulder, initial encounter: Secondary | ICD-10-CM | POA: Insufficient documentation

## 2019-01-13 ENCOUNTER — Other Ambulatory Visit: Payer: Self-pay | Admitting: Gastroenterology

## 2019-01-13 DIAGNOSIS — R198 Other specified symptoms and signs involving the digestive system and abdomen: Secondary | ICD-10-CM

## 2019-01-16 ENCOUNTER — Ambulatory Visit
Admission: RE | Admit: 2019-01-16 | Discharge: 2019-01-16 | Disposition: A | Discharge status: Home / Self Care | Payer: Medicare Other | Source: Ambulatory Visit | Attending: Gastroenterology | Admitting: Gastroenterology

## 2019-01-16 ENCOUNTER — Other Ambulatory Visit: Payer: Self-pay

## 2019-01-16 DIAGNOSIS — R198 Other specified symptoms and signs involving the digestive system and abdomen: Secondary | ICD-10-CM | POA: Insufficient documentation

## 2019-04-10 ENCOUNTER — Other Ambulatory Visit: Payer: Self-pay

## 2019-04-10 ENCOUNTER — Other Ambulatory Visit
Admission: RE | Admit: 2019-04-10 | Discharge: 2019-04-10 | Disposition: A | Discharge status: Home / Self Care | Payer: Medicare Other | Source: Ambulatory Visit | Attending: Internal Medicine | Admitting: Internal Medicine

## 2019-04-10 DIAGNOSIS — Z20828 Contact with and (suspected) exposure to other viral communicable diseases: Secondary | ICD-10-CM | POA: Diagnosis not present

## 2019-04-10 DIAGNOSIS — Z01812 Encounter for preprocedural laboratory examination: Secondary | ICD-10-CM | POA: Insufficient documentation

## 2019-04-10 LAB — SARS CORONAVIRUS 2 (TAT 6-24 HRS): SARS Coronavirus 2: NEGATIVE

## 2019-04-11 ENCOUNTER — Encounter: Payer: Self-pay | Admitting: Internal Medicine

## 2019-04-14 ENCOUNTER — Encounter
Admission: RE | Disposition: A | Discharge status: Home / Self Care | Payer: Self-pay | Source: Home / Self Care | Attending: Internal Medicine

## 2019-04-14 ENCOUNTER — Ambulatory Visit: Payer: Medicare Other | Admitting: Anesthesiology

## 2019-04-14 ENCOUNTER — Ambulatory Visit
Admission: RE | Admit: 2019-04-14 | Discharge: 2019-04-14 | Disposition: A | Discharge status: Home / Self Care | Payer: Medicare Other | Attending: Internal Medicine | Admitting: Internal Medicine

## 2019-04-14 ENCOUNTER — Encounter: Payer: Self-pay | Admitting: Internal Medicine

## 2019-04-14 ENCOUNTER — Other Ambulatory Visit: Payer: Self-pay

## 2019-04-14 DIAGNOSIS — C9590 Leukemia, unspecified not having achieved remission: Secondary | ICD-10-CM | POA: Diagnosis not present

## 2019-04-14 DIAGNOSIS — Z1211 Encounter for screening for malignant neoplasm of colon: Secondary | ICD-10-CM | POA: Diagnosis present

## 2019-04-14 DIAGNOSIS — E119 Type 2 diabetes mellitus without complications: Secondary | ICD-10-CM | POA: Diagnosis not present

## 2019-04-14 DIAGNOSIS — K640 First degree hemorrhoids: Secondary | ICD-10-CM | POA: Diagnosis not present

## 2019-04-14 DIAGNOSIS — Z8673 Personal history of transient ischemic attack (TIA), and cerebral infarction without residual deficits: Secondary | ICD-10-CM | POA: Insufficient documentation

## 2019-04-14 DIAGNOSIS — E7800 Pure hypercholesterolemia, unspecified: Secondary | ICD-10-CM | POA: Diagnosis not present

## 2019-04-14 DIAGNOSIS — Z79899 Other long term (current) drug therapy: Secondary | ICD-10-CM | POA: Insufficient documentation

## 2019-04-14 DIAGNOSIS — Z7982 Long term (current) use of aspirin: Secondary | ICD-10-CM | POA: Insufficient documentation

## 2019-04-14 DIAGNOSIS — D125 Benign neoplasm of sigmoid colon: Secondary | ICD-10-CM | POA: Diagnosis not present

## 2019-04-14 DIAGNOSIS — I10 Essential (primary) hypertension: Secondary | ICD-10-CM | POA: Insufficient documentation

## 2019-04-14 DIAGNOSIS — Z7984 Long term (current) use of oral hypoglycemic drugs: Secondary | ICD-10-CM | POA: Insufficient documentation

## 2019-04-14 HISTORY — DX: Cerebral infarction, unspecified: I63.9

## 2019-04-14 HISTORY — DX: Gastro-esophageal reflux disease without esophagitis: K21.9

## 2019-04-14 HISTORY — PX: COLONOSCOPY WITH PROPOFOL: SHX5780

## 2019-04-14 HISTORY — DX: Malignant (primary) neoplasm, unspecified: C80.1

## 2019-04-14 LAB — GLUCOSE, CAPILLARY: Glucose-Capillary: 123 mg/dL — ABNORMAL HIGH (ref 70–99)

## 2019-04-14 SURGERY — COLONOSCOPY WITH PROPOFOL
Anesthesia: General

## 2019-04-14 MED ORDER — SODIUM CHLORIDE 0.9 % IV SOLN
INTRAVENOUS | Status: DC
  Administered 2019-04-14: 09:00:00 via INTRAVENOUS

## 2019-04-14 MED ORDER — PROPOFOL 10 MG/ML IV BOLUS
INTRAVENOUS | Status: DC | PRN
  Administered 2019-04-14: 40 mg via INTRAVENOUS

## 2019-04-14 MED ORDER — PROPOFOL 500 MG/50ML IV EMUL
INTRAVENOUS | Status: DC | PRN
  Administered 2019-04-14: 120 ug/kg/min via INTRAVENOUS

## 2019-04-14 MED ORDER — LIDOCAINE HCL (CARDIAC) PF 100 MG/5ML IV SOSY
PREFILLED_SYRINGE | INTRAVENOUS | Status: DC | PRN
  Administered 2019-04-14: 60 mg via INTRAVENOUS

## 2019-04-14 MED ORDER — PROPOFOL 500 MG/50ML IV EMUL
INTRAVENOUS | Status: AC
  Filled 2019-04-14: qty 50

## 2019-04-14 NOTE — Op Note (Signed)
[facility]astroenterology Patient Name: Jermaine Kramer Procedure Date: 04/14/2019 7:52 AM MRN: [id number] Account #: [id number] Date of Birth: [birth year] Admit Type: Outpatient Age: 68 Room: [facility] ENDO ROOM 1 Gender: Male Note Status: Finalized Procedure:             Colonoscopy Indications:           Screening for colorectal malignant neoplasm Providers:             Benay Pike. Alice Reichert MD, MD Referring MD:          Ricardo Jericho (Referring MD) Medicines:             Propofol per Anesthesia Complications:         No immediate complications. Procedure:             Pre-Anesthesia Assessment:                        - The risks and benefits of the procedure and the                         sedation options and risks were discussed with the                         patient. All questions were answered and informed                         consent was obtained.                        - Patient identification and proposed procedure were                         verified prior to the procedure by the nurse. The                         procedure was verified in the procedure room.                        - ASA Grade Assessment: III - A patient with severe                         systemic disease.                        - After reviewing the risks and benefits, the patient                         was deemed in satisfactory condition to undergo the                         procedure.                        After obtaining informed consent, the colonoscope was                         passed under direct vision. Throughout the procedure,                         the patient's blood pressure, pulse,  and oxygen                         saturations were monitored continuously. The                         Colonoscope was introduced through the anus and                         advanced to the the cecum, identified by appendiceal                         orifice and ileocecal valve.  The colonoscopy was                         performed without difficulty. The patient tolerated                         the procedure well. The quality of the bowel                         preparation was good. The ileocecal valve, appendiceal                         orifice, and rectum were photographed. Findings:      The perianal and digital rectal examinations were normal. Pertinent       negatives include normal sphincter tone and no palpable rectal lesions.      Two sessile polyps were found in the sigmoid colon. The polyps were 3 to       5 mm in size. These polyps were removed with a jumbo cold forceps.       Resection and retrieval were complete.      Non-bleeding internal hemorrhoids were found during retroflexion. The       hemorrhoids were Grade I (internal hemorrhoids that do not prolapse).      The exam was otherwise without abnormality. Impression:            - Two 3 to 5 mm polyps in the sigmoid colon, removed                         with a jumbo cold forceps. Resected and retrieved.                        - Non-bleeding internal hemorrhoids.                        - The examination was otherwise normal. Recommendation:        - Patient has a contact number available for                         emergencies. The signs and symptoms of potential                         delayed complications were discussed with the patient.                         Return to normal activities tomorrow. Written  discharge instructions were provided to the patient.                        - Resume previous diet.                        - Continue present medications.                        - Await pathology results.                        - Repeat colonoscopy is recommended for surveillance.                         The colonoscopy date will be determined after                         pathology results from today's exam become available                         for review.                         - Return to GI office PRN.                        - The findings and recommendations were discussed with                         the patient. Procedure Code(s):     --- Professional ---                        [phone], Colonoscopy, flexible; with biopsy, single or                         multiple Diagnosis Code(s):     --- Professional ---                        K64.0, First degree hemorrhoids                        K63.5, Polyp of colon                        Z12.11, Encounter for screening for malignant neoplasm                         of colon CPT copyright 2019 American [employer] Association. All rights reserved. The codes documented in this report are preliminary and upon coder review may  be revised to meet current compliance requirements. Efrain Sella MD, MD 04/14/2019 8:59:23 AM This report has been signed electronically. Number of Addenda: 0 Note Initiated On: 04/14/2019 7:52 AM Scope Withdrawal Time: 0 hours 11 minutes 8 seconds  Total Procedure Duration: 0 hours 15 minutes 54 seconds  Estimated Blood Loss:  Estimated blood loss: none.      [facility]

## 2019-04-14 NOTE — Anesthesia Postprocedure Evaluation (Signed)
Anesthesia Post Note  Patient: Jermaine Kramer  Procedure(s) Performed: COLONOSCOPY WITH PROPOFOL (N/A )  Patient location during evaluation: PACU Anesthesia Type: General Level of consciousness: awake and alert Pain management: pain level controlled Vital Signs Assessment: post-procedure vital signs reviewed and stable Respiratory status: spontaneous breathing, nonlabored ventilation and respiratory function stable Cardiovascular status: blood pressure returned to baseline and stable Postop Assessment: no apparent nausea or vomiting Anesthetic complications: no     Last Vitals:  Vitals:   04/14/19 0912 04/14/19 0920  BP: 105/69   Pulse: 67 65  Resp: 17 15  Temp:    SpO2: 97% 96%    Last Pain:  Vitals:   04/14/19 0902  TempSrc: Tympanic  PainSc:                  Durenda Hurt

## 2019-04-14 NOTE — Transfer of Care (Signed)
Immediate Anesthesia Transfer of Care Note  Patient: Jermaine Kramer  Procedure(s) Performed: COLONOSCOPY WITH PROPOFOL (N/A )  Patient Location: PACU  Anesthesia Type:General  Level of Consciousness: sedated  Airway & Oxygen Therapy: Patient Spontanous Breathing and Patient connected to nasal cannula oxygen  Post-op Assessment: Report given to RN and Post -op Vital signs reviewed and stable  Post vital signs: Reviewed and stable  Last Vitals:  Vitals Value Taken Time  BP 113/68 04/14/19 0902  Temp    Pulse 67 04/14/19 0903  Resp 16 04/14/19 0903  SpO2 99 % 04/14/19 0903  Vitals shown include unvalidated device data.  Last Pain:  Vitals:   04/14/19 0902  TempSrc: (P) Tympanic  PainSc:          Complications: No apparent anesthesia complications

## 2019-04-14 NOTE — Anesthesia Post-op Follow-up Note (Signed)
Anesthesia QCDR form completed.        

## 2019-04-14 NOTE — H&P (Signed)
Outpatient short stay form Pre-procedure 04/14/2019 8:34 AM Jermaine Kramer K. Alice Reichert, M.D.  Primary Physician: Ricardo Jericho, NP  Reason for visit: Colon cancer screening  History of present illness: Patient presents for colonoscopy for colon cancer screening. The patient denies complaints of abdominal pain, significant change in bowel habits, or rectal bleeding.     Current Facility-Administered Medications:  .  0.9 %  sodium chloride infusion, , Intravenous, Continuous, Dua Mehler, Benay Pike, MD  Medications Prior to Admission  Medication Sig Dispense Refill Last Dose  . aspirin EC 81 MG tablet Take 81 mg by mouth daily.   Past Week at Unknown time  . atorvastatin (LIPITOR) 40 MG tablet Take 40 mg by mouth daily.   04/13/2019 at Unknown time  . glipiZIDE (GLUCOTROL) 5 MG tablet Take by mouth daily before breakfast.   04/13/2019 at Unknown time  . lisinopril-hydrochlorothiazide (PRINZIDE,ZESTORETIC) 20-12.5 MG tablet Take by mouth daily.   04/13/2019 at Unknown time  . metFORMIN (GLUCOPHAGE) 1000 MG tablet Take 1,000 mg by mouth 2 (two) times daily with a meal.   04/13/2019 at Unknown time  . potassium chloride (K-DUR) 10 MEQ tablet Take 10 mEq by mouth daily.   04/13/2019 at Unknown time  . HYDROcodone-acetaminophen (NORCO/VICODIN) 5-325 MG tablet 1-2 tabs po q 8 hours prn (Patient not taking: Reported on 04/14/2019) 8 tablet 0 Not Taking at Unknown time     No Known Allergies   Past Medical History:  Diagnosis Date  . Cancer ([place])    leukemia  . Diabetes mellitus without complication ([place])   . GERD (gastroesophageal reflux disease)   . Hypercholesteremia   . Hypertension   . Stroke [facility])     Review of systems:  Otherwise negative.    Physical Exam  Gen: Alert, oriented. Appears stated age.  HEENT: [place]/AT. PERRLA. Lungs: CTA, no wheezes. CV: RR nl S1, S2. Abd: soft, benign, no masses. BS+ Ext: No edema. Pulses 2+    Planned procedures: Proceed with colonoscopy.  The patient understands the nature of the planned procedure, indications, risks, alternatives and potential complications including but not limited to bleeding, infection, perforation, damage to internal organs and possible oversedation/side effects from anesthesia. The patient agrees and gives consent to proceed.  Please refer to procedure notes for findings, recommendations and patient disposition/instructions.     Maricella Filyaw K. Alice Reichert, M.D. Gastroenterology 04/14/2019  8:34 AM

## 2019-04-14 NOTE — Interval H&P Note (Signed)
History and Physical Interval Note:  04/14/2019 8:35 AM  Jermaine Kramer  has presented today for surgery, with the diagnosis of CCA.  The various methods of treatment have been discussed with the patient and family. After consideration of risks, benefits and other options for treatment, the patient has consented to  Procedure(s): COLONOSCOPY WITH PROPOFOL (N/A) as a surgical intervention.  The patient's history has been reviewed, patient examined, no change in status, stable for surgery.  I have reviewed the patient's chart and labs.  Questions were answered to the patient's satisfaction.     [place], [place]

## 2019-04-14 NOTE — Anesthesia Preprocedure Evaluation (Addendum)
Anesthesia Evaluation  Patient identified by MRN, date of birth, ID band Patient awake    Reviewed: Allergy & Precautions, H&P , NPO status , Patient's Chart, lab work & pertinent test results  Airway Mallampati: III  TM Distance: >3 FB     Dental  (+) Poor Dentition, Missing   Pulmonary neg pulmonary ROS,           Cardiovascular hypertension,      Neuro/Psych CVA, No Residual Symptoms negative psych ROS   GI/Hepatic Neg liver ROS, GERD  Controlled,  Endo/Other  diabetes  Renal/GU negative Renal ROS  negative genitourinary   Musculoskeletal   Abdominal   Peds  Hematology negative hematology ROS (+)   Anesthesia Other Findings Past Medical History: No date: Cancer ([place])     Comment:  leukemia No date: Diabetes mellitus without complication ([facility]) No date: GERD (gastroesophageal reflux disease) No date: Hypercholesteremia No date: Hypertension No date: Stroke [facility])  Past Surgical History: No date: CHOLECYSTECTOMY No date: KNEE ARTHROSCOPY No date: RENAL CYST EXCISION     Reproductive/Obstetrics negative OB ROS                            Anesthesia Physical Anesthesia Plan  ASA: III  Anesthesia Plan: General   Post-op Pain Management:    Induction:   PONV Risk Score and Plan: Propofol infusion and TIVA  Airway Management Planned: Natural Airway and Nasal Cannula  Additional Equipment:   Intra-op Plan:   Post-operative Plan:   Informed Consent: I have reviewed the patients History and Physical, chart, labs and discussed the procedure including the risks, benefits and alternatives for the proposed anesthesia with the patient or authorized representative who has indicated his/her understanding and acceptance.     Dental Advisory Given  Plan Discussed with: Anesthesiologist  Anesthesia Plan Comments:         Anesthesia Quick Evaluation

## 2019-04-15 ENCOUNTER — Encounter: Payer: Self-pay | Admitting: *Deleted

## 2019-04-15 LAB — SURGICAL PATHOLOGY

## 2020-02-03 ENCOUNTER — Other Ambulatory Visit
Admission: RE | Admit: 2020-02-03 | Discharge: 2020-02-03 | Disposition: A | Discharge status: Home / Self Care | Payer: Medicare Other | Attending: Urology | Admitting: Urology

## 2020-02-03 ENCOUNTER — Ambulatory Visit (INDEPENDENT_AMBULATORY_CARE_PROVIDER_SITE_OTHER): Payer: Medicare Other | Admitting: Urology

## 2020-02-03 ENCOUNTER — Other Ambulatory Visit: Payer: Self-pay

## 2020-02-03 ENCOUNTER — Encounter: Payer: Self-pay | Admitting: Urology

## 2020-02-03 VITALS — BP 153/96 | HR 82 | Ht 68.0 in | Wt 189.0 lb

## 2020-02-03 DIAGNOSIS — R300 Dysuria: Secondary | ICD-10-CM | POA: Insufficient documentation

## 2020-02-03 DIAGNOSIS — N489 Disorder of penis, unspecified: Secondary | ICD-10-CM | POA: Diagnosis not present

## 2020-02-03 LAB — URINALYSIS, COMPLETE (UACMP) WITH MICROSCOPIC
Bilirubin Urine: NEGATIVE
Glucose, UA: 100 mg/dL — AB
Nitrite: NEGATIVE
Protein, ur: NEGATIVE mg/dL
Specific Gravity, Urine: 1.025 (ref 1.005–1.030)
pH: 5 (ref 5.0–8.0)

## 2020-02-03 NOTE — Progress Notes (Signed)
   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  He was diagnosed by Dr. Jacqlyn Larsen in 2015 with a 1.5 cm lesion on the glans penis, and ultimately underwent Mohs surgery with dermatology at [facility].  I do not see that he followed up with them regarding surveillance.  He reports that over the last 6 months he has had worsening urinary symptoms of split stream, urgency, frequency, and dysuria.  There are no prior urinalyses to review.  He denies any gross hematuria.   PMH: Past Medical History:  Diagnosis Date  . Cancer ([place])    leukemia  . Diabetes mellitus without complication ([place])   . GERD (gastroesophageal reflux disease)   . Hypercholesteremia   . Hypertension   . Stroke [facility])     Surgical History: Past Surgical History:  Procedure Laterality Date  . CHOLECYSTECTOMY    . COLONOSCOPY WITH PROPOFOL N/A 04/14/2019   Procedure: COLONOSCOPY WITH PROPOFOL;  Surgeon: [place], Benay Pike, MD;  Location: [facility] ENDOSCOPY;  Service: Gastroenterology;  Laterality: N/A;  . KNEE ARTHROSCOPY    . RENAL CYST EXCISION      Family History: Family History  Problem Relation Age of Onset  . Parkinson's disease Mother   . Lupus Mother   . Heart disease Father     Social History:  reports that he has never smoked. He has never used smokeless tobacco. He reports previous alcohol use. He reports that he does not use drugs.  Physical Exam: BP (!) 153/96   Pulse 82   Ht 5\' 8"  (1.727 m)   Wt 189 lb (85.7 kg)   BMI 28.74 kg/m    Constitutional:  Alert and oriented, No acute distress. Cardiovascular: No clubbing, cyanosis, or edema. Respiratory: Normal respiratory effort, no increased work of breathing. GI: Abdomen is soft, nontender, nondistended, no abdominal masses GU: Multiple verrucous lesions of the glans penis including involvement of the  urethral meatus worrisome for recurrence of penile cancer   Assessment & Plan:   I had a very frank conversation with the patient and his wife that his clinical exam is very concerning for recurrence of his squamous cell carcinoma of the penis that now involves the meatus which is causing his urinary symptoms.  We discussed the need for further evaluation including possible biopsy, and likely partial, and possible total penectomy, as well as consideration of further imaging with possible MRI to evaluate extent of his disease.  I also recommended a urinalysis today to rule out UTI.  Urinalysis and culture today, call with results [facility] for suspected recurrent penile cancer  Nickolas Madrid, MD 02/03/2020  [place] [address] [place], [place] [address] [phone]

## 2020-02-04 LAB — URINE CULTURE

## 2020-02-05 ENCOUNTER — Telehealth: Payer: Self-pay | Admitting: Family Medicine

## 2020-02-05 NOTE — Telephone Encounter (Signed)
Patient called and is wanting to know the result of the urine culture. Please advise

## 2020-02-05 NOTE — Telephone Encounter (Signed)
It just showed some mixed species, likely contaminant and not true UTI, would not recommend abx at this time  Nickolas Madrid, MD 02/05/2020

## 2020-02-05 NOTE — Telephone Encounter (Signed)
Patient notified and voiced understanding.

## 2020-04-09 DIAGNOSIS — R339 Retention of urine, unspecified: Secondary | ICD-10-CM | POA: Insufficient documentation

## 2020-10-27 ENCOUNTER — Ambulatory Visit
Admission: EM | Admit: 2020-10-27 | Discharge: 2020-10-27 | Disposition: A | Discharge status: Home / Self Care | Payer: Medicare Other | Attending: Family Medicine | Admitting: Family Medicine

## 2020-10-27 ENCOUNTER — Other Ambulatory Visit: Payer: Self-pay

## 2020-10-27 DIAGNOSIS — H1033 Unspecified acute conjunctivitis, bilateral: Secondary | ICD-10-CM

## 2020-10-27 DIAGNOSIS — H66002 Acute suppurative otitis media without spontaneous rupture of ear drum, left ear: Secondary | ICD-10-CM | POA: Diagnosis not present

## 2020-10-27 MED ORDER — POLYMYXIN B-TRIMETHOPRIM 10000-0.1 UNIT/ML-% OP SOLN: 1.0000 [drp] | Freq: Four times a day (QID) | OPHTHALMIC | 0 refills | Status: AC

## 2020-10-27 MED ORDER — AMOXICILLIN-POT CLAVULANATE 875-125 MG PO TABS: 1.0000 | ORAL_TABLET | Freq: Two times a day (BID) | ORAL | 0 refills | Status: DC

## 2020-10-27 NOTE — Discharge Instructions (Addendum)
Medications as prescribed. ° °Take care ° °Dr. Nakeesha Bowler  °

## 2020-10-27 NOTE — ED Triage Notes (Signed)
Pt c/o nasal congestion for several days. Pt also reports feeling ear pressure on the left and his eyes were matted this morning. Pt denies f/n/v/d or other symptoms.

## 2020-10-27 NOTE — ED Provider Notes (Signed)
[facility]    CSN: [id number] Arrival date & time: 10/27/20  0905      History   Chief Complaint Chief Complaint  Patient presents with   Nasal Congestion   Otalgia    l   HPI 70 year old male presents with the above complaints.  Patient reports that for the past few days he has had congestion.  He also reports left ear fullness and discomfort.  He has some difficulty hearing out of the left ear.  He states that his eyes were matted shut this morning.  He had discharge coming from both eyes.  He states that his grandchild has recently been sick.  No fever.  No relieving factors.  No other complaints or concerns at this time.  Past Medical History:  Diagnosis Date   Cancer ([place])    leukemia   Diabetes mellitus without complication ([place])    GERD (gastroesophageal reflux disease)    Hypercholesteremia    Hypertension    Stroke [facility])    Past Surgical History:  Procedure Laterality Date   CHOLECYSTECTOMY     COLONOSCOPY WITH PROPOFOL N/A 04/14/2019   Procedure: COLONOSCOPY WITH PROPOFOL;  Surgeon: Toledo, Benay Pike, MD;  Location: [facility] ENDOSCOPY;  Service: Gastroenterology;  Laterality: N/A;   KNEE ARTHROSCOPY     RENAL CYST EXCISION      Home Medications    Prior to Admission medications   Medication Sig Start Date End Date Taking? Authorizing Provider  amoxicillin-clavulanate (AUGMENTIN) 875-125 MG tablet Take 1 tablet by mouth 2 (two) times daily. 10/27/20  Yes Coral Spikes, DO  aspirin EC 81 MG tablet Take 81 mg by mouth daily.   Yes [provider]  atorvastatin (LIPITOR) 40 MG tablet Take 40 mg by mouth daily.   Yes [provider]  glipiZIDE (GLUCOTROL) 5 MG tablet Take by mouth daily before breakfast.   Yes [provider]  JARDIANCE 10 MG TABS tablet Take 10 mg by mouth daily. 10/06/20  Yes [provider]  lisinopril-hydrochlorothiazide (PRINZIDE,ZESTORETIC) 20-12.5 MG tablet Take by mouth daily.   Yes [provider]  metFORMIN (GLUCOPHAGE) 1000 MG tablet Take 1,000 mg by mouth 2 (two) times daily with a meal.   Yes [provider]  potassium chloride (K-DUR) 10 MEQ tablet Take 10 mEq by mouth daily.   Yes [provider]  tamsulosin (FLOMAX) 0.4 MG CAPS capsule Take 1 tablet by mouth daily. 04/09/20  Yes [provider]  trimethoprim-polymyxin b (POLYTRIM) ophthalmic solution Place 1 drop into both eyes every 6 (six) hours for 5 days. 10/27/20 11/01/20 Yes Coral Spikes, DO    Family History Family History  Problem Relation Age of Onset   Parkinson's disease Mother    Lupus Mother    Heart disease Father     Social History Social History   Tobacco Use   Smoking status: Never   Smokeless tobacco: Never  Vaping Use   Vaping Use: Never used  Substance Use Topics   Alcohol use: Not Currently   Drug use: No     Allergies   Patient has no known allergies.   Review of Systems Review of Systems Per HPI  Physical Exam Triage Vital Signs ED Triage Vitals  Enc Vitals Group     BP 10/27/20 0916 116/76     Pulse Rate 10/27/20 0916 71     Resp 10/27/20 0916 18     Temp 10/27/20 0916 97.9 F (36.6 C)  Temp Source 10/27/20 0916 Oral     SpO2 10/27/20 0916 98 %     Weight 10/27/20 0913 188 lb (85.3 kg)     Height 10/27/20 0913 5\' 7"  (1.702 m)     Head Circumference --      Peak Flow --      Pain Score 10/27/20 0913 0     Pain Loc --      Pain Edu? --      Excl. in [place]? --    Updated Vital Signs BP 116/76 (BP Location: Left Arm)   Pulse 71   Temp 97.9 F (36.6 C) (Oral)   Resp 18   Ht 5\' 7"  (1.702 m)   Wt 85.3 kg   SpO2 98%   BMI 29.44 kg/m   Visual Acuity Right Eye Distance:   Left Eye Distance:   Bilateral Distance:    Right Eye Near:   Left Eye Near:    Bilateral Near:     Physical Exam Vitals and nursing note reviewed.  Constitutional:      General: He is not in acute distress.    Appearance: Normal appearance. He is not  ill-appearing.  HENT:     Head: Normocephalic and atraumatic.     Right Ear: Tympanic membrane normal.     Ears:     Comments: Left TM with bulging, erythema, and purulent effusion. Eyes:     Comments: Bilateral eye discharge.   Cardiovascular:     Rate and Rhythm: Normal rate and regular rhythm.     Heart sounds: No murmur heard. Pulmonary:     Effort: Pulmonary effort is normal. No respiratory distress.  Neurological:     Mental Status: He is alert.  Psychiatric:        Mood and Affect: Mood normal.        Behavior: Behavior normal.    UC Treatments / Results  Labs (all labs ordered are listed, but only abnormal results are displayed) Labs Reviewed - No data to display  EKG   Radiology No results found.  Procedures Procedures (including critical care time)  Medications Ordered in UC Medications - No data to display  Initial Impression / Assessment and Plan / UC Course  I have reviewed the triage vital signs and the nursing notes.  Pertinent labs & imaging results that were available during my care of the patient were reviewed by me and considered in my medical decision making (see chart for details).    70 year old male presents with otitis media.  Patient also has findings consistent with bacterial conjunctivitis.  Treating with Polytrim and Augmentin.  Final Clinical Impressions(s) / UC Diagnoses   Final diagnoses:  Acute suppurative otitis media of left ear without spontaneous rupture of tympanic membrane, recurrence not specified  Acute bacterial conjunctivitis of both eyes     Discharge Instructions      Medications as prescribed.  Take care  Dr. Lacinda Axon    ED Prescriptions     Medication Sig Dispense Auth. Provider   amoxicillin-clavulanate (AUGMENTIN) 875-125 MG tablet Take 1 tablet by mouth 2 (two) times daily. 20 tablet Delene Morais G, DO   trimethoprim-polymyxin b (POLYTRIM) ophthalmic solution Place 1 drop into both eyes every 6 (six) hours  for 5 days. 10 mL Coral Spikes, DO      PDMP not reviewed this encounter.   Thersa [place], [place] 10/27/20 [phone]

## 2020-11-30 DIAGNOSIS — N453 Epididymo-orchitis: Secondary | ICD-10-CM | POA: Insufficient documentation

## 2020-12-10 DIAGNOSIS — I493 Ventricular premature depolarization: Secondary | ICD-10-CM | POA: Insufficient documentation

## 2020-12-10 DIAGNOSIS — R001 Bradycardia, unspecified: Secondary | ICD-10-CM | POA: Insufficient documentation

## 2020-12-10 DIAGNOSIS — I420 Dilated cardiomyopathy: Secondary | ICD-10-CM | POA: Diagnosis present

## 2021-02-16 HISTORY — PX: CORONARY ARTERY BYPASS GRAFT: SHX141

## 2021-02-18 DIAGNOSIS — D62 Acute posthemorrhagic anemia: Secondary | ICD-10-CM | POA: Insufficient documentation

## 2021-02-18 DIAGNOSIS — D696 Thrombocytopenia, unspecified: Secondary | ICD-10-CM | POA: Insufficient documentation

## 2021-02-18 DIAGNOSIS — G8918 Other acute postprocedural pain: Secondary | ICD-10-CM | POA: Insufficient documentation

## 2021-02-18 DIAGNOSIS — R4182 Altered mental status, unspecified: Secondary | ICD-10-CM | POA: Insufficient documentation

## 2021-02-18 DIAGNOSIS — Z789 Other specified health status: Secondary | ICD-10-CM | POA: Insufficient documentation

## 2021-02-18 DIAGNOSIS — Z79899 Other long term (current) drug therapy: Secondary | ICD-10-CM | POA: Insufficient documentation

## 2021-02-19 DIAGNOSIS — E8770 Fluid overload, unspecified: Secondary | ICD-10-CM | POA: Insufficient documentation

## 2021-02-27 DIAGNOSIS — K1121 Acute sialoadenitis: Secondary | ICD-10-CM | POA: Insufficient documentation

## 2021-04-21 ENCOUNTER — Inpatient Hospital Stay
Admission: EM | Admit: 2021-04-21 | Discharge: 2021-04-23 | DRG: 177 | Disposition: A | Discharge status: Home / Self Care | Payer: Medicare Other | Attending: Internal Medicine | Admitting: Internal Medicine

## 2021-04-21 ENCOUNTER — Other Ambulatory Visit: Payer: Self-pay

## 2021-04-21 ENCOUNTER — Emergency Department: Payer: Medicare Other

## 2021-04-21 ENCOUNTER — Inpatient Hospital Stay: Payer: Medicare Other

## 2021-04-21 DIAGNOSIS — E1169 Type 2 diabetes mellitus with other specified complication: Secondary | ICD-10-CM

## 2021-04-21 DIAGNOSIS — E118 Type 2 diabetes mellitus with unspecified complications: Secondary | ICD-10-CM

## 2021-04-21 DIAGNOSIS — J9601 Acute respiratory failure with hypoxia: Secondary | ICD-10-CM | POA: Diagnosis present

## 2021-04-21 DIAGNOSIS — I4510 Unspecified right bundle-branch block: Secondary | ICD-10-CM | POA: Diagnosis present

## 2021-04-21 DIAGNOSIS — I255 Ischemic cardiomyopathy: Secondary | ICD-10-CM | POA: Diagnosis present

## 2021-04-21 DIAGNOSIS — Z6831 Body mass index (BMI) 31.0-31.9, adult: Secondary | ICD-10-CM

## 2021-04-21 DIAGNOSIS — I420 Dilated cardiomyopathy: Secondary | ICD-10-CM | POA: Diagnosis present

## 2021-04-21 DIAGNOSIS — Z8673 Personal history of transient ischemic attack (TIA), and cerebral infarction without residual deficits: Secondary | ICD-10-CM

## 2021-04-21 DIAGNOSIS — E669 Obesity, unspecified: Secondary | ICD-10-CM | POA: Diagnosis present

## 2021-04-21 DIAGNOSIS — I10 Essential (primary) hypertension: Secondary | ICD-10-CM | POA: Diagnosis present

## 2021-04-21 DIAGNOSIS — Z79899 Other long term (current) drug therapy: Secondary | ICD-10-CM

## 2021-04-21 DIAGNOSIS — Z951 Presence of aortocoronary bypass graft: Secondary | ICD-10-CM | POA: Diagnosis not present

## 2021-04-21 DIAGNOSIS — I2510 Atherosclerotic heart disease of native coronary artery without angina pectoris: Secondary | ICD-10-CM | POA: Diagnosis present

## 2021-04-21 DIAGNOSIS — Z8249 Family history of ischemic heart disease and other diseases of the circulatory system: Secondary | ICD-10-CM

## 2021-04-21 DIAGNOSIS — Z7901 Long term (current) use of anticoagulants: Secondary | ICD-10-CM

## 2021-04-21 DIAGNOSIS — E1165 Type 2 diabetes mellitus with hyperglycemia: Secondary | ICD-10-CM | POA: Diagnosis present

## 2021-04-21 DIAGNOSIS — U071 COVID-19: Secondary | ICD-10-CM | POA: Diagnosis present

## 2021-04-21 DIAGNOSIS — R778 Other specified abnormalities of plasma proteins: Secondary | ICD-10-CM | POA: Diagnosis not present

## 2021-04-21 DIAGNOSIS — Z7982 Long term (current) use of aspirin: Secondary | ICD-10-CM | POA: Diagnosis not present

## 2021-04-21 DIAGNOSIS — K219 Gastro-esophageal reflux disease without esophagitis: Secondary | ICD-10-CM | POA: Diagnosis present

## 2021-04-21 DIAGNOSIS — Z8549 Personal history of malignant neoplasm of other male genital organs: Secondary | ICD-10-CM

## 2021-04-21 DIAGNOSIS — E7800 Pure hypercholesterolemia, unspecified: Secondary | ICD-10-CM | POA: Diagnosis present

## 2021-04-21 DIAGNOSIS — Z7984 Long term (current) use of oral hypoglycemic drugs: Secondary | ICD-10-CM | POA: Diagnosis not present

## 2021-04-21 DIAGNOSIS — Z856 Personal history of leukemia: Secondary | ICD-10-CM | POA: Diagnosis not present

## 2021-04-21 DIAGNOSIS — I480 Paroxysmal atrial fibrillation: Secondary | ICD-10-CM | POA: Diagnosis present

## 2021-04-21 DIAGNOSIS — R7989 Other specified abnormal findings of blood chemistry: Secondary | ICD-10-CM

## 2021-04-21 DIAGNOSIS — I9789 Other postprocedural complications and disorders of the circulatory system, not elsewhere classified: Secondary | ICD-10-CM | POA: Diagnosis present

## 2021-04-21 DIAGNOSIS — I4891 Unspecified atrial fibrillation: Secondary | ICD-10-CM | POA: Diagnosis present

## 2021-04-21 LAB — CBG MONITORING, ED
Glucose-Capillary: 162 mg/dL — ABNORMAL HIGH (ref 70–99)
Glucose-Capillary: 342 mg/dL — ABNORMAL HIGH (ref 70–99)
Glucose-Capillary: 245 mg/dL — ABNORMAL HIGH (ref 70–99)

## 2021-04-21 LAB — CBC WITH DIFFERENTIAL/PLATELET
Abs Immature Granulocytes: 0.01 10*3/uL (ref 0.00–0.07)
Basophils Absolute: 0 10*3/uL (ref 0.0–0.1)
Basophils Relative: 1 %
Eosinophils Absolute: 0.1 10*3/uL (ref 0.0–0.5)
Eosinophils Relative: 2 %
HCT: 39.5 % (ref 39.0–52.0)
Hemoglobin: 13 g/dL (ref 13.0–17.0)
Immature Granulocytes: 0 %
Lymphocytes Relative: 32 %
Lymphs Abs: 1.1 10*3/uL (ref 0.7–4.0)
MCH: 30.7 pg (ref 26.0–34.0)
MCHC: 32.9 g/dL (ref 30.0–36.0)
MCV: 93.4 fL (ref 80.0–100.0)
Monocytes Absolute: 0.6 10*3/uL (ref 0.1–1.0)
Monocytes Relative: 16 %
Neutro Abs: 1.7 10*3/uL (ref 1.7–7.7)
Neutrophils Relative %: 49 %
Platelets: 156 10*3/uL (ref 150–400)
RBC: 4.23 MIL/uL (ref 4.22–5.81)
RDW: 12.7 % (ref 11.5–15.5)
WBC: 3.4 10*3/uL — ABNORMAL LOW (ref 4.0–10.5)
nRBC: 0 % (ref 0.0–0.2)

## 2021-04-21 LAB — COMPREHENSIVE METABOLIC PANEL
ALT: 17 U/L (ref 0–44)
AST: 25 U/L (ref 15–41)
Albumin: 3.3 g/dL — ABNORMAL LOW (ref 3.5–5.0)
Alkaline Phosphatase: 63 U/L (ref 38–126)
Anion gap: 6 (ref 5–15)
BUN: 17 mg/dL (ref 8–23)
CO2: 22 mmol/L (ref 22–32)
Calcium: 8.3 mg/dL — ABNORMAL LOW (ref 8.9–10.3)
Chloride: 104 mmol/L (ref 98–111)
Creatinine, Ser: 1.01 mg/dL (ref 0.61–1.24)
GFR, Estimated: >60 mL/min (ref >60)
Glucose, Bld: 125 mg/dL — ABNORMAL HIGH (ref 70–99)
Potassium: 3.7 mmol/L (ref 3.5–5.1)
Sodium: 132 mmol/L — ABNORMAL LOW (ref 135–145)
Total Bilirubin: 1 mg/dL (ref 0.3–1.2)
Total Protein: 6.7 g/dL (ref 6.5–8.1)

## 2021-04-21 LAB — BLOOD GAS, VENOUS
Acid-base deficit: 0.6 mmol/L (ref 0.0–2.0)
Bicarbonate: 23.2 mmol/L (ref 20.0–28.0)
O2 Saturation: 97.4 %
Patient temperature: 37
pCO2, Ven: 35 mmHg — ABNORMAL LOW (ref 44.0–60.0)
pH, Ven: 7.43 (ref 7.250–7.430)
pO2, Ven: 93 mmHg — ABNORMAL HIGH (ref 32.0–45.0)

## 2021-04-21 LAB — C-REACTIVE PROTEIN: CRP: 6.1 mg/dL — ABNORMAL HIGH (ref <1.0)

## 2021-04-21 LAB — BRAIN NATRIURETIC PEPTIDE: B Natriuretic Peptide: 627.3 pg/mL — ABNORMAL HIGH (ref 0.0–100.0)

## 2021-04-21 LAB — APTT: aPTT: 38 seconds — ABNORMAL HIGH (ref 24–36)

## 2021-04-21 LAB — PROTIME-INR
INR: 1.1 (ref 0.8–1.2)
Prothrombin Time: 14.4 seconds (ref 11.4–15.2)

## 2021-04-21 LAB — LACTIC ACID, PLASMA
Lactic Acid, Venous: 1 mmol/L (ref 0.5–1.9)
Lactic Acid, Venous: 1 mmol/L (ref 0.5–1.9)

## 2021-04-21 LAB — MAGNESIUM: Magnesium: 1.4 mg/dL — ABNORMAL LOW (ref 1.7–2.4)

## 2021-04-21 LAB — TROPONIN I (HIGH SENSITIVITY)
Troponin I (High Sensitivity): 23 ng/L — ABNORMAL HIGH (ref <18)
Troponin I (High Sensitivity): 27 ng/L — ABNORMAL HIGH (ref <18)

## 2021-04-21 LAB — PROCALCITONIN: Procalcitonin: <0.1 ng/mL

## 2021-04-21 LAB — D-DIMER, QUANTITATIVE: D-Dimer, Quant: 0.75 ug/mL-FEU — ABNORMAL HIGH (ref 0.00–0.50)

## 2021-04-21 LAB — GLUCOSE, CAPILLARY: Glucose-Capillary: 175 mg/dL — ABNORMAL HIGH (ref 70–99)

## 2021-04-21 MED ORDER — SODIUM CHLORIDE 0.9 % IV SOLN
200.0000 mg | Freq: Once | INTRAVENOUS | Status: AC
  Administered 2021-04-21: 08:00:00 200 mg via INTRAVENOUS
  Filled 2021-04-21: qty 200

## 2021-04-21 MED ORDER — ONDANSETRON HCL 4 MG/2ML IJ SOLN: 4.0000 mg | Freq: Four times a day (QID) | INTRAMUSCULAR | Status: DC | PRN

## 2021-04-21 MED ORDER — AMIODARONE HCL 200 MG PO TABS
200.0000 mg | ORAL_TABLET | Freq: Every day | ORAL | Status: DC
  Administered 2021-04-21 – 2021-04-23 (×3): 200 mg via ORAL
  Filled 2021-04-21 (×3): qty 1

## 2021-04-21 MED ORDER — INSULIN ASPART 100 UNIT/ML IJ SOLN
0.0000 [IU] | Freq: Three times a day (TID) | INTRAMUSCULAR | Status: DC
  Administered 2021-04-21: 17:00:00 5 [IU] via SUBCUTANEOUS
  Administered 2021-04-21: 12:00:00 11 [IU] via SUBCUTANEOUS
  Administered 2021-04-22: 17:00:00 3 [IU] via SUBCUTANEOUS
  Administered 2021-04-22: 13:00:00 8 [IU] via SUBCUTANEOUS
  Administered 2021-04-23: 09:00:00 2 [IU] via SUBCUTANEOUS
  Administered 2021-04-23: 13:00:00 11 [IU] via SUBCUTANEOUS
  Filled 2021-04-21 (×6): qty 1

## 2021-04-21 MED ORDER — ONDANSETRON HCL 4 MG PO TABS: 4.0000 mg | ORAL_TABLET | Freq: Four times a day (QID) | ORAL | Status: DC | PRN

## 2021-04-21 MED ORDER — PANTOPRAZOLE SODIUM 40 MG PO TBEC
40.0000 mg | DELAYED_RELEASE_TABLET | Freq: Every day | ORAL | Status: DC
  Administered 2021-04-21 – 2021-04-23 (×3): 40 mg via ORAL
  Filled 2021-04-21 (×3): qty 1

## 2021-04-21 MED ORDER — AMLODIPINE BESYLATE 5 MG PO TABS
5.0000 mg | ORAL_TABLET | Freq: Every day | ORAL | Status: DC
Start: 2021-04-21 — End: 2021-04-21

## 2021-04-21 MED ORDER — ALBUTEROL SULFATE HFA 108 (90 BASE) MCG/ACT IN AERS
2.0000 | INHALATION_SPRAY | Freq: Four times a day (QID) | RESPIRATORY_TRACT | Status: DC | PRN
  Filled 2021-04-21: qty 6.7

## 2021-04-21 MED ORDER — ATORVASTATIN CALCIUM 20 MG PO TABS
80.0000 mg | ORAL_TABLET | Freq: Every day | ORAL | Status: DC
  Administered 2021-04-21 – 2021-04-23 (×3): 80 mg via ORAL
  Filled 2021-04-21 (×3): qty 4

## 2021-04-21 MED ORDER — GUAIFENESIN-DM 100-10 MG/5ML PO SYRP: 10.0000 mL | ORAL_SOLUTION | ORAL | Status: DC | PRN

## 2021-04-21 MED ORDER — ACETAMINOPHEN 325 MG PO TABS: 650.0000 mg | ORAL_TABLET | Freq: Four times a day (QID) | ORAL | Status: DC | PRN

## 2021-04-21 MED ORDER — FUROSEMIDE 20 MG PO TABS
20.0000 mg | ORAL_TABLET | Freq: Every day | ORAL | Status: DC
  Administered 2021-04-21 – 2021-04-23 (×3): 20 mg via ORAL
  Filled 2021-04-21 (×3): qty 1

## 2021-04-21 MED ORDER — POTASSIUM CHLORIDE CRYS ER 20 MEQ PO TBCR
40.0000 meq | EXTENDED_RELEASE_TABLET | Freq: Once | ORAL | Status: AC
  Administered 2021-04-21: 10:00:00 40 meq via ORAL
  Filled 2021-04-21: qty 2

## 2021-04-21 MED ORDER — METOPROLOL SUCCINATE ER 25 MG PO TB24
25.0000 mg | ORAL_TABLET | Freq: Every day | ORAL | Status: DC
  Administered 2021-04-21 – 2021-04-23 (×3): 25 mg via ORAL
  Filled 2021-04-21 (×3): qty 1

## 2021-04-21 MED ORDER — TAMSULOSIN HCL 0.4 MG PO CAPS
0.4000 mg | ORAL_CAPSULE | Freq: Every day | ORAL | Status: DC
  Administered 2021-04-21 – 2021-04-23 (×3): 0.4 mg via ORAL
  Filled 2021-04-21 (×3): qty 1

## 2021-04-21 MED ORDER — LISINOPRIL 5 MG PO TABS
2.5000 mg | ORAL_TABLET | Freq: Every day | ORAL | Status: DC
  Administered 2021-04-21 – 2021-04-23 (×3): 2.5 mg via ORAL
  Filled 2021-04-21 (×4): qty 1

## 2021-04-21 MED ORDER — ASPIRIN EC 81 MG PO TBEC
81.0000 mg | DELAYED_RELEASE_TABLET | Freq: Every day | ORAL | Status: DC
  Administered 2021-04-22 – 2021-04-23 (×2): 81 mg via ORAL
  Filled 2021-04-21 (×2): qty 1

## 2021-04-21 MED ORDER — ASCORBIC ACID 500 MG PO TABS
500.0000 mg | ORAL_TABLET | Freq: Every day | ORAL | Status: DC
  Administered 2021-04-21 – 2021-04-23 (×3): 500 mg via ORAL
  Filled 2021-04-21 (×3): qty 1

## 2021-04-21 MED ORDER — ZINC SULFATE 220 (50 ZN) MG PO CAPS
220.0000 mg | ORAL_CAPSULE | Freq: Every day | ORAL | Status: DC
  Administered 2021-04-21 – 2021-04-23 (×3): 220 mg via ORAL
  Filled 2021-04-21 (×3): qty 1

## 2021-04-21 MED ORDER — ASPIRIN 81 MG PO CHEW
324.0000 mg | CHEWABLE_TABLET | Freq: Once | ORAL | Status: AC
  Administered 2021-04-21: 08:00:00 324 mg via ORAL
  Filled 2021-04-21: qty 4

## 2021-04-21 MED ORDER — IPRATROPIUM-ALBUTEROL 0.5-2.5 (3) MG/3ML IN SOLN
3.0000 mL | Freq: Once | RESPIRATORY_TRACT | Status: AC
  Administered 2021-04-21: 06:00:00 3 mL via RESPIRATORY_TRACT
  Filled 2021-04-21: qty 3

## 2021-04-21 MED ORDER — MAGNESIUM SULFATE 2 GM/50ML IV SOLN
2.0000 g | Freq: Once | INTRAVENOUS | Status: AC
  Administered 2021-04-21: 10:00:00 2 g via INTRAVENOUS
  Filled 2021-04-21: qty 50

## 2021-04-21 MED ORDER — DEXAMETHASONE 4 MG PO TABS
6.0000 mg | ORAL_TABLET | ORAL | Status: DC
  Administered 2021-04-22 – 2021-04-23 (×2): 6 mg via ORAL
  Filled 2021-04-21 (×2): qty 2
  Filled 2021-04-21: qty 1

## 2021-04-21 MED ORDER — INSULIN DETEMIR 100 UNIT/ML ~~LOC~~ SOLN
0.1000 [IU]/kg | Freq: Two times a day (BID) | SUBCUTANEOUS | Status: DC
  Administered 2021-04-21 – 2021-04-23 (×4): 9 [IU] via SUBCUTANEOUS
  Filled 2021-04-21 (×6): qty 0.09

## 2021-04-21 MED ORDER — APIXABAN 5 MG PO TABS
5.0000 mg | ORAL_TABLET | Freq: Two times a day (BID) | ORAL | Status: DC
  Administered 2021-04-21 – 2021-04-23 (×5): 5 mg via ORAL
  Filled 2021-04-21 (×5): qty 1

## 2021-04-21 MED ORDER — DEXAMETHASONE SODIUM PHOSPHATE 10 MG/ML IJ SOLN
10.0000 mg | Freq: Once | INTRAMUSCULAR | Status: AC
  Administered 2021-04-21: 06:00:00 10 mg via INTRAVENOUS
  Filled 2021-04-21: qty 1

## 2021-04-21 MED ORDER — SODIUM CHLORIDE 0.9 % IV SOLN
100.0000 mg | Freq: Every day | INTRAVENOUS | Status: DC
  Administered 2021-04-22 – 2021-04-23 (×2): 100 mg via INTRAVENOUS
  Filled 2021-04-21 (×2): qty 100
  Filled 2021-04-21: qty 20

## 2021-04-21 NOTE — ED Notes (Signed)
BGL [id number]

## 2021-04-21 NOTE — ED Notes (Signed)
Pt alert watching tv.

## 2021-04-21 NOTE — ED Notes (Signed)
Pt given lunch tray at this time

## 2021-04-21 NOTE — Plan of Care (Signed)

## 2021-04-21 NOTE — ED Notes (Signed)
Pt and family member given snack and ice as requested. Pt ate about 75% of lunch. Pt denies any other needs. Stretcher locked low. Rail up. Call bell within reach.

## 2021-04-21 NOTE — ED Notes (Signed)
Resumed care from georgie rn.  Pt alert  talking on cell phone.  Ptt waiting for admission.  Pt denies chest pain or sob at this time.  Iv in place.

## 2021-04-21 NOTE — ED Notes (Signed)
This RN called lab and verified they would come draw D-dimer and C-reactive protein on this pt.

## 2021-04-21 NOTE — ED Triage Notes (Addendum)
Home - pt has wheezing going on for a few days- pt has afib- pt on room air 94%, RR 24-29, pt had open heart surgery in October, 144/100, temp 98.5 oral., pt tested positive for covid 2 days ago.

## 2021-04-21 NOTE — ED Notes (Signed)
IV's not pulling back well enough to collect adequate blood sample. This RN called lab at this time and confirmed they would come draw timed troponin level.

## 2021-04-21 NOTE — H&P (Signed)
History and Physical    Jermaine Kramer BWG:[id number] DOB: Apr 14, 1951 DOA: 04/21/2021  PCP: Sherre Scarlet, PA-C   Patient coming from: Home  I have personally briefly reviewed patient's old medical records in [place]  Chief Complaint: SOB/Wheezing  HPI: Jermaine Kramer is a 70 y.o. male with medical history significant for coronary artery disease status post CABG, history of postoperative A. fib on anticoagulation with Eliquis, dilated cardiomyopathy last known LVEF of 35 - 40%, diabetes mellitus, hypertension who presents to the emergency room for evaluation of worsening shortness of breath and wheezing. Patient's symptoms started on 12/19 with a cough productive of clear phlegm and congestion.  He also complains of myalgias as well as generalized weakness.  His initial positive COVID-19 PCR test was on 04/20/21 and he was started on Molnupiravir , he took 1 dose of the medication. Overnight he developed worsening shortness of breath associated with wheezing and so his wife called EMS based on recommendation from the COVID response team. He denies having any nausea, no vomiting, no diarrhea, no anorexia, no fever, no chills, no chest pain, no palpitations, no diaphoresis, no dizziness, no lightheadedness, no headache, no blurred vision no focal deficit. ABG 7.43/35/93/23.2/97.4 Sodium 132, potassium 3.7, chloride 104, bicarb 22, glucose 125, BUN 17, creatinine 1.01, calcium 8.3, magnesium 1.4, alk phos 63, albumin 3.3, AST 25, ALT 17, total bilirubin 6.7, BNP 627, troponin 23, lactic acid 1.0, white count 3.4, hemoglobin 13.0, hematocrit 39.5, MCV 93.4, RDW 12.7, platelet count 156, PT 14.4, INR 1.1 Urine analysis is stable Chest x-ray reviewed by me shows no evidence of active cardiopulmonary disease X-ray of the neck shows calcified carotid atherosclerosis and large chronic anterior cervical endplate osteophytes. Twelve-lead EKG shows sinus tachycardia with a right bundle branch  block   ED Course: Patient is a 70 year old male with a history of coronary artery disease status post CABG who presents to the ER via EMS for evaluation of worsening shortness of breath associated with dizziness.  Patient tested positive for COVID-19 virus on 04/20/21 but his symptoms started on 04/18/21. Patient took 1 dose of the oral antiviral Molnupiravir 1 day prior to his admission. He was ambulated in the ER and had room air pulse oximetry of 87% and so was placed on 2 L of oxygen. He received a dose of Decadron 10 mg IV and was started on remdesivir. He will be admitted to the hospital for further evaluation.  Review of Systems: As per HPI otherwise all other systems reviewed and negative.    Past Medical History:  Diagnosis Date   Cancer ([place])    leukemia   Diabetes mellitus without complication ([place])    GERD (gastroesophageal reflux disease)    Hypercholesteremia    Hypertension    Stroke [facility])     Past Surgical History:  Procedure Laterality Date   CHOLECYSTECTOMY     COLONOSCOPY WITH PROPOFOL N/A 04/14/2019   Procedure: COLONOSCOPY WITH PROPOFOL;  Surgeon: Toledo, Benay Pike, MD;  Location: [facility] ENDOSCOPY;  Service: Gastroenterology;  Laterality: N/A;   KNEE ARTHROSCOPY     RENAL CYST EXCISION       reports that he has never smoked. He has never used smokeless tobacco. He reports that he does not currently use alcohol. He reports that he does not use drugs.  No Known Allergies  Family History  Problem Relation Age of Onset   Parkinson's disease Mother    Lupus Mother    Heart disease Father  Prior to Admission medications   Medication Sig Start Date End Date Taking? Authorizing Provider  furosemide (LASIX) 20 MG tablet Take 1 tablet by mouth daily. 01/17/21 07/30/21 Yes [provider]  metoprolol succinate (TOPROL-XL) 25 MG 24 hr tablet Take 1 tablet by mouth daily. 12/10/20 12/10/21 Yes [provider]  amiodarone (PACERONE) 200 MG  tablet Take 200 mg by mouth daily. 03/06/21   [provider]  amLODipine (NORVASC) 5 MG tablet Take 5 mg by mouth daily. 03/01/21   [provider]  amoxicillin-clavulanate (AUGMENTIN) 875-125 MG tablet Take 1 tablet by mouth 2 (two) times daily. 10/27/20   Coral Spikes, DO  aspirin EC 81 MG tablet Take 81 mg by mouth daily.    [provider]  atorvastatin (LIPITOR) 40 MG tablet Take 40 mg by mouth daily.    [provider]  atorvastatin (LIPITOR) 80 MG tablet Take 80 mg by mouth daily. 02/10/21   [provider]  ELIQUIS 5 MG TABS tablet Take 1 tablet by mouth in the morning and at bedtime. 04/02/21   [provider]  glipiZIDE (GLUCOTROL) 5 MG tablet Take by mouth daily before breakfast.    [provider]  JARDIANCE 10 MG TABS tablet Take 10 mg by mouth daily. 10/06/20   [provider]  lisinopril (ZESTRIL) 2.5 MG tablet Take 2.5 mg by mouth daily. 03/06/21   [provider]  lisinopril-hydrochlorothiazide (PRINZIDE,ZESTORETIC) 20-12.5 MG tablet Take by mouth daily.    [provider]  metFORMIN (GLUCOPHAGE) 1000 MG tablet Take 1,000 mg by mouth 2 (two) times daily with a meal.    [provider]  olmesartan (BENICAR) 40 MG tablet Take 40 mg by mouth daily. 12/31/20   [provider]  omeprazole (PRILOSEC) 40 MG capsule Take 40 mg by mouth daily. 03/11/21   [provider]  OZEMPIC, 0.25 OR 0.5 MG/DOSE, 2 MG/1.5ML SOPN Inject into the skin. 03/06/21   [provider]  potassium chloride (K-DUR) 10 MEQ tablet Take 10 mEq by mouth daily.    [provider]  spironolactone (ALDACTONE) 25 MG tablet Take 12.5 mg by mouth daily. 02/10/21   [provider]  tamsulosin (FLOMAX) 0.4 MG CAPS capsule Take 1 tablet by mouth daily. 04/09/20   [provider]    Physical Exam: Vitals:   04/21/21 0625 04/21/21 0630 04/21/21 0700 04/21/21 0712  BP:  122/66  123/73   Pulse: 92 87  97  Resp: 20 20    Temp:      TempSrc:      SpO2: 98% 99%  100%  Weight:      Height:         Vitals:   04/21/21 0625 04/21/21 0630 04/21/21 0700 04/21/21 0712  BP:  122/66 123/73   Pulse: 92 87  97  Resp: 20 20    Temp:      TempSrc:      SpO2: 98% 99%  100%  Weight:      Height:          Constitutional: Alert and oriented x 3 . Not in any apparent distress.  LifeVest in place HEENT:      Head: Normocephalic and atraumatic.         Eyes: PERLA, EOMI, Conjunctivae are normal. Sclera is non-icteric.       Mouth/Throat: Mucous membranes are moist.       Neck: Supple with no signs of meningismus. Cardiovascular: Regular rate and rhythm.  No murmurs, gallops, or rubs. 2+ symmetrical distal pulses are present . No JVD. No LE edema Respiratory: Respiratory effort normal .  Bilateral air entry in both lung fields.  Scattered wheezes  Gastrointestinal: Soft, non tender, and non distended with positive bowel sounds.  Genitourinary: No CVA tenderness. Musculoskeletal: Nontender with normal range of motion in all extremities. No cyanosis, or erythema of extremities. Neurologic:  Face is symmetric. Moving all extremities. No gross focal neurologic deficits . Skin: Skin is warm, dry.  No rash or ulcers Psychiatric: Mood and affect are normal    Labs on Admission: I have personally reviewed following labs and imaging studies  CBC: Recent Labs  Lab 04/21/21 0528  WBC 3.4*  NEUTROABS 1.7  HGB 13.0  HCT 39.5  MCV 93.4  PLT 825   Basic Metabolic Panel: Recent Labs  Lab 04/21/21 0529  NA 132*  K 3.7  CL 104  CO2 22  GLUCOSE 125*  BUN 17  CREATININE 1.01  CALCIUM 8.3*  MG 1.4*   GFR: Estimated Creatinine Clearance: 67.4 mL/min (by C-G formula based on SCr of 1.01 mg/dL). Liver Function Tests: Recent Labs  Lab 04/21/21 0529  AST 25  ALT 17  ALKPHOS 63  BILITOT 1.0  PROT 6.7  ALBUMIN 3.3*   No results for input(s): LIPASE, AMYLASE in  the last 168 hours. No results for input(s): AMMONIA in the last 168 hours. Coagulation Profile: Recent Labs  Lab 04/21/21 0543  INR 1.1   Cardiac Enzymes: No results for input(s): CKTOTAL, CKMB, CKMBINDEX, TROPONINI in the last 168 hours. BNP (last 3 results) No results for input(s): PROBNP in the last 8760 hours. HbA1C: No results for input(s): HGBA1C in the last 72 hours. CBG: No results for input(s): GLUCAP in the last 168 hours. Lipid Profile: No results for input(s): CHOL, HDL, LDLCALC, TRIG, CHOLHDL, LDLDIRECT in the last 72 hours. Thyroid Function Tests: No results for input(s): TSH, T4TOTAL, FREET4, T3FREE, THYROIDAB in the last 72 hours. Anemia Panel: No results for input(s): VITAMINB12, FOLATE, FERRITIN, TIBC, IRON, RETICCTPCT in the last 72 hours. Urine analysis:    Component Value Date/Time   COLORURINE YELLOW 02/03/2020 1010   APPEARANCEUR CLEAR 02/03/2020 1010   APPEARANCEUR CLEAR 01/15/2013 1659   LABSPEC 1.025 02/03/2020 1010   LABSPEC 1.005 01/15/2013 1659   PHURINE 5.0 02/03/2020 1010   GLUCOSEU 100 (A) 02/03/2020 1010   GLUCOSEU NEGATIVE 01/15/2013 1659   HGBUR TRACE (A) 02/03/2020 1010   BILIRUBINUR NEGATIVE 02/03/2020 1010   BILIRUBINUR NEGATIVE 01/15/2013 1659   KETONESUR TRACE (A) 02/03/2020 1010   PROTEINUR NEGATIVE 02/03/2020 1010   NITRITE NEGATIVE 02/03/2020 1010   LEUKOCYTESUR TRACE (A) 02/03/2020 1010   LEUKOCYTESUR 1+ 01/15/2013 1659    Radiological Exams on Admission: DG Neck Soft Tissue  Result Date: 04/21/2021 CLINICAL DATA:  70 year old male with wheezing. Shortness of breath. Heart surgery in October. EXAM: NECK SOFT TISSUES - 1+ VIEW COMPARISON:  CT cervical spine 10/27/2010. FINDINGS: Large chronic anterior endplate osteophyte at K5-L9. Prominent chronic C2-C3 endplate osteophytosis also. Normal prevertebral soft tissue contour. Pharyngeal and epiglottis contours within normal limits. Bilateral cervical carotid calcified  atherosclerosis. Visualized tracheal air column is within normal limits. No acute osseous abnormality identified. Negative visible upper chest. IMPRESSION: Negative; calcified carotid atherosclerosis and large chronic anterior cervical endplate osteophytes. Electronically Signed   By: Genevie Ann M.D.   On: 04/21/2021 06:00   DG Chest 2 View  Result Date: 04/21/2021 CLINICAL DATA:  70 year old male with wheezing.  Shortness of breath. Heart surgery in October. EXAM: CHEST - 2 VIEW COMPARISON:  04/28/2017. FINDINGS: PA and lateral views of the chest. Interval CABG. Mediastinal contours remain within normal limits. Stable lung volumes. No pneumothorax, pulmonary edema, pleural effusion or confluent pulmonary opacity. Visualized tracheal air column is within normal limits. No acute osseous abnormality identified. Stable cholecystectomy clips. Paucity of bowel gas in the upper abdomen. IMPRESSION: No acute cardiopulmonary abnormality. Electronically Signed   By: Genevie Ann M.D.   On: 04/21/2021 05:58     Assessment/Plan Principal Problem:   COVID-19 Active Problems:   Diabetes mellitus without complication (H[facility])   GERD (gastroesophageal reflux disease)   Hypertension   CAD (coronary artery disease)   AF (paroxysmal atrial fibrillation) [facility])     Patient is a 70 year old male admitted to the hospital for COVID-19 viral infection.    COVID-19 viral infection Patient is vaccinated and received 1 COVID-19 booster COVID-19 PCR test was positive on 04/20/21 but symptoms started on 04/18/21 Continue remdesivir per protocol Continue Decadron 6 mg daily As needed bronchodilator therapy Supportive care with antitussives and vitamins    Coronary artery disease status post CABG/ischemic cardiomyopathy Last known LVEF 35 to 40%. Patient has a LifeVest as primary prophylaxis for sudden cardiac death Continue aspirin, lisinopril, metoprolol and atorvastatin   Paroxysmal A. Fib Continue amiodarone and  metoprolol for rate control Continue Eliquis 5 mg twice daily as primary prophylaxis for an acute stroke    Diabetes mellitus Glycemic control with Levemir and sliding scale insulin    Hypokalemia/hypomagnesemia Secondary to diuretic therapy Supplement potassium and magnesium     DVT prophylaxis: Apixaban Code Status: full code  Family Communication: Greater than 50% of time was spent discussing patient's condition and plan of care with him and his wife at the bedside.  All questions and concerns have been addressed.  He lists his wife as his healthcare power of attorney.  CODE STATUS was discussed and he is a full code Disposition Plan: Back to previous home environment Consults called: none  Status:At the time of admission, it appears that the appropriate admission status for this patient is inpatient. This is judged to be reasonable and necessary to provide the required intensity of service to ensure the patient's safety given the presenting symptoms, physical exam findings, and initial radiographic and laboratory data in the context of their comorbid conditions. Patient requires inpatient status due to high intensity of service, high risk for further deterioration and high frequency of surveillance required.     Collier Bullock MD [facility]     04/21/2021, 8:47 AM

## 2021-04-21 NOTE — ED Notes (Signed)
This RN called lab again and confirmed they would come and get D-dimer and C-reactive protein on this pt. They stated they would get someone to come draw labs as soon as they could. Lines do not pull back well enough for collection from IV's.

## 2021-04-21 NOTE — ED Notes (Signed)
Fsbs 245

## 2021-04-21 NOTE — ED Provider Notes (Signed)
[facility] Emergency Department Provider Note  ____________________________________________   Event Date/Time   First MD Initiated Contact with Patient 04/21/21 [id number]     (approximate)  I have reviewed the triage vital signs and the nursing notes.   HISTORY  Chief Complaint Shortness of Breath   HPI Jermaine Kramer is a 70 y.o. male with a past medical history significant for coronary artery disease (s/p recent CABG with a LIMA to LAD, SVG to ramus, and SVG to PDA on 02/16/21 with Dr. Rosalita Chessman), post-operative atrial fibrillation (currently rhythm controlled with Amiodarone and anticoagulated with Eliquis), a dilated cardiomyopathy (LVEF 30% per cardiac MRI in 01/27/21), history of frequent PVCs, type 2 diabetes, history of a TIA, history of penile cancer, and hypertension who presents for assessment of 3 to 4 days of worsening cough congestion and wheezing.  Patient states this chest burning [place] is coughing as well.  He denies any fevers, earache, headache, vomiting, diarrhea, burning with urination, [place] pain, rash or extremity pain.  No recent falls or injuries.  He has been compliant with all his medications.  He notes he was diagnosed with COVID yesterday and took 1 dose of Paxlovid yesterday.          Past Medical History:  Diagnosis Date   Cancer ([place])    leukemia   Diabetes mellitus without complication ([place])    GERD (gastroesophageal reflux disease)    Hypercholesteremia    Hypertension    Stroke ([place])     There are no problems to display for this patient.   Past Surgical History:  Procedure Laterality Date   CHOLECYSTECTOMY     COLONOSCOPY WITH PROPOFOL N/A 04/14/2019   Procedure: COLONOSCOPY WITH PROPOFOL;  Surgeon: Toledo, Benay Pike, MD;  Location: [facility] ENDOSCOPY;  Service: Gastroenterology;  Laterality: N/A;   KNEE ARTHROSCOPY     RENAL CYST EXCISION      Prior to Admission medications   Medication Sig Start Date End Date  Taking? Authorizing Provider  amoxicillin-clavulanate (AUGMENTIN) 875-125 MG tablet Take 1 tablet by mouth 2 (two) times daily. 10/27/20   Coral Spikes, DO  aspirin EC 81 MG tablet Take 81 mg by mouth daily.    [provider]  atorvastatin (LIPITOR) 40 MG tablet Take 40 mg by mouth daily.    [provider]  glipiZIDE (GLUCOTROL) 5 MG tablet Take by mouth daily before breakfast.    [provider]  JARDIANCE 10 MG TABS tablet Take 10 mg by mouth daily. 10/06/20   [provider]  lisinopril-hydrochlorothiazide (PRINZIDE,ZESTORETIC) 20-12.5 MG tablet Take by mouth daily.    [provider]  metFORMIN (GLUCOPHAGE) 1000 MG tablet Take 1,000 mg by mouth 2 (two) times daily with a meal.    [provider]  potassium chloride (K-DUR) 10 MEQ tablet Take 10 mEq by mouth daily.    [provider]  tamsulosin (FLOMAX) 0.4 MG CAPS capsule Take 1 tablet by mouth daily. 04/09/20   [provider]    Allergies Patient has no known allergies.  Family History  Problem Relation Age of Onset   Parkinson's disease Mother    Lupus Mother    Heart disease Father     Social History Social History   Tobacco Use   Smoking status: Never   Smokeless tobacco: Never  Vaping Use   Vaping Use: Never used  Substance Use Topics   Alcohol use: Not Currently   Drug use: No    Review  of Systems  Review of Systems  Constitutional:  Positive for malaise/fatigue. Negative for chills and fever.  HENT:  Positive for congestion. Negative for sore throat.   Eyes:  Negative for pain.  Respiratory:  Positive for cough, shortness of breath and wheezing. Negative for stridor.   Cardiovascular:  Positive for chest pain (w/ coughing).  Gastrointestinal:  Negative for vomiting.  Genitourinary:  Negative for dysuria.  Musculoskeletal:  Negative for myalgias.  Skin:  Negative for rash.  Neurological:  Negative for seizures, loss of consciousness and  headaches.  Psychiatric/Behavioral:  Negative for suicidal ideas.   All other systems reviewed and are negative.    ____________________________________________   PHYSICAL EXAM:  VITAL SIGNS: ED Triage Vitals  Enc Vitals Group     BP      Pulse      Resp      Temp      Temp src      SpO2      Weight      Height      Head Circumference      Peak Flow      Pain Score      Pain Loc      Pain Edu?      Excl. in [place]?    Vitals:   04/21/21 0700 04/21/21 0712  BP: 123/73   Pulse:  97  Resp:    Temp:    SpO2:  100%   Physical Exam Vitals and nursing note reviewed.  Constitutional:      General: He is in acute distress.     Appearance: He is well-developed. He is ill-appearing.  HENT:     Head: Normocephalic and atraumatic.  Eyes:     Conjunctiva/sclera: Conjunctivae normal.  Cardiovascular:     Rate and Rhythm: Normal rate and regular rhythm.     Heart sounds: No murmur heard. Pulmonary:     Effort: Tachypnea, accessory muscle usage and respiratory distress present.     Breath sounds: Stridor present. Decreased breath sounds and wheezing present.  Abdominal:     Palpations: Abdomen is soft.     Tenderness: There is no abdominal tenderness.  Musculoskeletal:        General: No swelling.     Cervical back: Neck supple.  Skin:    General: Skin is warm and dry.     Capillary Refill: Capillary refill takes less than 2 seconds.  Neurological:     Mental Status: He is alert.  Psychiatric:        Mood and Affect: Mood normal.     ____________________________________________   LABS (all labs ordered are listed, but only abnormal results are displayed)  Labs Reviewed  CBC WITH DIFFERENTIAL/PLATELET - Abnormal; Notable for the following components:      Result Value   WBC 3.4 (*)    All other components within normal limits  BLOOD GAS, VENOUS - Abnormal; Notable for the following components:   pCO2, Ven 35 (*)    pO2, Ven 93.0 (*)    All other components  within normal limits  BRAIN NATRIURETIC PEPTIDE - Abnormal; Notable for the following components:   B Natriuretic Peptide 627.3 (*)    All other components within normal limits  APTT - Abnormal; Notable for the following components:   aPTT 38 (*)    All other components within normal limits  TROPONIN I (HIGH SENSITIVITY) - Abnormal; Notable for the following components:   Troponin I (High Sensitivity) 23 (*)  All other components within normal limits  CULTURE, BLOOD (ROUTINE X 2)  CULTURE, BLOOD (ROUTINE X 2)  LACTIC ACID, PLASMA  LACTIC ACID, PLASMA  PROTIME-INR  COMPREHENSIVE METABOLIC PANEL  MAGNESIUM  PROCALCITONIN  TROPONIN I (HIGH SENSITIVITY)   ____________________________________________  EKG  ECG shows sinus tachycardia with a ventricular rate of 103, right bundle branch block, QTc interval of 521 and some nonspecific ST changes and inferior lateral leads without other clear evidence of acute ischemia or significant arrhythmia. ____________________________________________  RADIOLOGY  ED MD interpretation:    Lateral neck x-ray shows pharyngeal and epiglottis contours within normal limits.  There is evidence of bilateral carotid calcified aortic atherosclerosis.  Chest x-ray shows no focal consolidation, effusion, edema, pneumothorax or other acute thoracic process  Official radiology report(s): DG Neck Soft Tissue  Result Date: 04/21/2021 CLINICAL DATA:  70 year old male with wheezing. Shortness of breath. Heart surgery in October. EXAM: NECK SOFT TISSUES - 1+ VIEW COMPARISON:  CT cervical spine 10/27/2010. FINDINGS: Large chronic anterior endplate osteophyte at P5-K9. Prominent chronic C2-C3 endplate osteophytosis also. Normal prevertebral soft tissue contour. Pharyngeal and epiglottis contours within normal limits. Bilateral cervical carotid calcified atherosclerosis. Visualized tracheal air column is within normal limits. No acute osseous abnormality identified.  Negative visible upper chest. IMPRESSION: Negative; calcified carotid atherosclerosis and large chronic anterior cervical endplate osteophytes. Electronically Signed   By: Genevie Ann M.D.   On: 04/21/2021 06:00   DG Chest 2 View  Result Date: 04/21/2021 CLINICAL DATA:  70 year old male with wheezing. Shortness of breath. Heart surgery in October. EXAM: CHEST - 2 VIEW COMPARISON:  04/28/2017. FINDINGS: PA and lateral views of the chest. Interval CABG. Mediastinal contours remain within normal limits. Stable lung volumes. No pneumothorax, pulmonary edema, pleural effusion or confluent pulmonary opacity. Visualized tracheal air column is within normal limits. No acute osseous abnormality identified. Stable cholecystectomy clips. Paucity of bowel gas in the upper abdomen. IMPRESSION: No acute cardiopulmonary abnormality. Electronically Signed   By: Genevie Ann M.D.   On: 04/21/2021 05:58    ____________________________________________   PROCEDURES  Procedure(s) performed (including Critical Care):  .Critical Care Performed by: Lucrezia Starch, MD Authorized by: Lucrezia Starch, MD   Critical care provider statement:    Critical care time (minutes):  30   Critical care was necessary to treat or prevent imminent or life-threatening deterioration of the following conditions:  Respiratory failure   Critical care was time spent personally by me on the following activities:  Development of treatment plan with patient or surrogate, discussions with consultants, evaluation of patient's response to treatment, examination of patient, ordering and review of laboratory studies, ordering and review of radiographic studies, ordering and performing treatments and interventions, pulse oximetry, re-evaluation of patient's condition and review of old charts   ____________________________________________   INITIAL IMPRESSION / [place] / ED COURSE      Patient presents with above-stated history exam  for assessment of worsening cough shortness of breath and wheezing over the last couple days having been diagnosed with COVID yesterday.  On arrival patient is tachycardic and tachypneic with some apparent stridorous breath sounds transmitted from upper airway into the lower lungs.  There is no significant wheezing on auscultation of the lower lungs.  Abdomen distended but otherwise soft throughout.  Patient is awake and alert.  Primary differential considerations include acute respiratory distress secondary to acute COVID-pneumonia and/or bronchitis, CHF, PE, ACS, arrhythmia, anemia, [place] derangements possibly edema of upper airway given stridor.  Patient  denies any fevers, sore throat or neck pain to suggest epiglottitis at this time although this certainly possible.  ECG shows sinus tachycardia with a ventricular rate of 103, right bundle branch block, QTc interval of 521 and some nonspecific ST changes and inferior lateral leads without other clear evidence of acute ischemia or significant arrhythmia.  Initial troponin is 23 which I suspect represents some demand ischemia in the setting of an acute COVID infection although we will plan to trend this and give ASA.  VBG shows no evidence of hypercarbic respiratory failure with a pH of 7.43 and a PCO2 of 35.  BNP is slightly elevated at 672 although overall patient does not appear grossly volume overloaded at this time.  Lactic acid 1.  CBC shows WBC count of 3.4 without evidence of acute anemia and normal platelets.  Lateral neck x-ray shows pharyngeal and epiglottis contours within normal limits.  There is evidence of bilateral carotid calcified aortic atherosclerosis.  Chest x-ray shows no focal consolidation, effusion, edema, pneumothorax or other acute thoracic process  Following Decadron and DuoNeb treatment patient states he is feeling much better and is stridor and wheezing seem to have improved.  Attempted trial of ambulation although on room  air patient's SPO2 decreased to 87%.  This improved back to 95% on 2 L nasal cannula.  I will admit to medicine service for further evaluation management of acute hypoxic respiratory failure secondary to COVID.  Low suspicion for PE at this time as patient states he is compliant with his Eliquis.      ____________________________________________   FINAL CLINICAL IMPRESSION(S) / ED DIAGNOSES  Final diagnoses:  COVID  Acute respiratory failure with hypoxia (HCC)  Troponin I above reference range    Medications  aspirin chewable tablet 324 mg (has no administration in time range)  dexamethasone (DECADRON) injection 10 mg (10 mg Intravenous Given 04/21/21 0602)  ipratropium-albuterol (DUONEB) 0.5-2.5 (3) MG/3ML nebulizer solution 3 mL (3 mLs Nebulization Given 04/21/21 5859)     ED Discharge Orders     None        Note:  This document was prepared using Dragon voice recognition software and may include unintentional dictation errors.    Lucrezia Starch, MD 04/21/21 [phone]

## 2021-04-21 NOTE — Consult Note (Signed)
Remdesivir - Pharmacy Brief Note   O:  ALT: pending CXR: No acute cardiopulmonary abnormality. SpO2: 100% on 2LNC   A/P:  Remdesivir 200 mg IVPB once followed by 100 mg IVPB daily x 4 days.   Pearla Dubonnet, PharmD Clinical Pharmacist 04/21/2021 7:29 AM

## 2021-04-22 ENCOUNTER — Encounter: Payer: Self-pay | Admitting: Internal Medicine

## 2021-04-22 DIAGNOSIS — I480 Paroxysmal atrial fibrillation: Secondary | ICD-10-CM

## 2021-04-22 LAB — COMPREHENSIVE METABOLIC PANEL
ALT: 17 U/L (ref 0–44)
AST: 21 U/L (ref 15–41)
Albumin: 3.2 g/dL — ABNORMAL LOW (ref 3.5–5.0)
Alkaline Phosphatase: 56 U/L (ref 38–126)
Anion gap: 5 (ref 5–15)
BUN: 21 mg/dL (ref 8–23)
CO2: 26 mmol/L (ref 22–32)
Calcium: 8.7 mg/dL — ABNORMAL LOW (ref 8.9–10.3)
Chloride: 106 mmol/L (ref 98–111)
Creatinine, Ser: 0.97 mg/dL (ref 0.61–1.24)
GFR, Estimated: >60 mL/min (ref >60)
Glucose, Bld: 134 mg/dL — ABNORMAL HIGH (ref 70–99)
Potassium: 4 mmol/L (ref 3.5–5.1)
Sodium: 137 mmol/L (ref 135–145)
Total Bilirubin: 0.6 mg/dL (ref 0.3–1.2)
Total Protein: 6.8 g/dL (ref 6.5–8.1)

## 2021-04-22 LAB — GLUCOSE, CAPILLARY
Glucose-Capillary: 114 mg/dL — ABNORMAL HIGH (ref 70–99)
Glucose-Capillary: 261 mg/dL — ABNORMAL HIGH (ref 70–99)
Glucose-Capillary: 276 mg/dL — ABNORMAL HIGH (ref 70–99)
Glucose-Capillary: 191 mg/dL — ABNORMAL HIGH (ref 70–99)
Glucose-Capillary: 249 mg/dL — ABNORMAL HIGH (ref 70–99)

## 2021-04-22 NOTE — Progress Notes (Signed)
Progress Note    Jermaine Kramer  KVQ:[id number] DOB: 12-Mar-1951  DOA: 04/21/2021 PCP: Sherre Scarlet, PA-C      Brief Narrative:    Medical records reviewed and are as summarized below:  Jermaine Kramer is a 70 y.o. male with medical history significant for CAD s/p CABG, postoperative atrial fibrillation on Eliquis, ischemic cardiomyopathy with EF of 25-35 % and has a LifeVest in place, diabetes mellitus, hypertension.  He presented to the hospital because of worsening shortness of breath and wheezing.  He had a productive cough, chest congestion, myalgia and generalized weakness ED on 04/18/2021.  He tested positive for COVID-19 infection on 04/20/2021 and he was started on Molnupiravir.,  His condition could worsen so he presented to the emergency room for further management.  He was admitted to the hospital for COVID-19 infection and acute hypoxemic respiratory failure.  Oxygen saturation was 88% on room air.  He was treated with IV dexamethasone and IV remdesivir.    Assessment/Plan:   Principal Problem:   COVID-19 Active Problems:   Diabetes mellitus without complication (HCC)   GERD (gastroesophageal reflux disease)   Hypertension   CAD (coronary artery disease)   AF (paroxysmal atrial fibrillation) (HCC)    Body mass index is 31.55 kg/m.  (Obesity)  COVID-19 infection: Continue IV remdesivir and IV dexamethasone.  Acute hypoxemic respiratory failure: He is on 2 L/min oxygen via nasal cannula.  Taper off oxygen as able.  CAD s/p CABG in October 2022, ischemic cardiomyopathy: Estimated EF 25 to 35% on TEE in October 2022.  Continue aspirin and Lipitor.  He is wearing a LifeVest.  Paroxysmal atrial fibrillation: Continue metoprolol, amiodarone and Eliquis  Insulin-dependent diabetes mellitus with hyperglycemia: Continue Levemir and use NovoLog as needed for hyperglycemia.    Diet Order             Diet heart healthy/carb modified Room service  appropriate? Yes; Fluid consistency: Thin  Diet effective now                      Consultants: None  Procedures: None    Medications:    amiodarone  200 mg Oral Daily   apixaban  5 mg Oral BID   vitamin C  500 mg Oral Daily   aspirin EC  81 mg Oral Daily   atorvastatin  80 mg Oral Daily   dexamethasone  6 mg Oral Q24H   furosemide  20 mg Oral Daily   insulin aspart  0-15 Units Subcutaneous TID WC   insulin detemir  0.1 Units/kg Subcutaneous BID   lisinopril  2.5 mg Oral Daily   metoprolol succinate  25 mg Oral Daily   pantoprazole  40 mg Oral Daily   tamsulosin  0.4 mg Oral Daily   zinc sulfate  220 mg Oral Daily   Continuous Infusions:  remdesivir 100 mg in NS 100 mL 100 mg (04/22/21 0954)     Anti-infectives (From admission, onward)    Start     Dose/Rate Route Frequency Ordered Stop   04/22/21 1000  remdesivir 100 mg in sodium chloride 0.9 % 100 mL IVPB       See Hyperspace for full Linked Orders Report.   100 mg 200 mL/hr over 30 Minutes Intravenous Daily 04/21/21 0728 04/26/21 0959   04/21/21 0830  remdesivir 200 mg in sodium chloride 0.9% 250 mL IVPB       See Hyperspace for full Linked Orders Report.  200 mg 580 mL/hr over 30 Minutes Intravenous Once 04/21/21 1610 04/21/21 0838              Family Communication/Anticipated D/C date and plan/Code Status   DVT prophylaxis:  apixaban (ELIQUIS) tablet 5 mg     Code Status: Full Code  Family Communication: None Disposition Plan: Possible discharge to home in 1 to 2 days   Status is: Inpatient  Remains inpatient appropriate because: On IV steroids, IV remdesivir           Subjective:   C/o cough productive of clear sputum.  Breathing is a little better today.  Objective:    Vitals:   04/21/21 2249 04/21/21 2257 04/22/21 0638 04/22/21 0747  BP: 119/85  113/65 117/73  Pulse: 71  65 67  Resp: 18  18 16   Temp: 97.7 F (36.5 C)  97.7 F (36.5 C) 98.3 F (36.8 C)   TempSrc: Oral  Oral Oral  SpO2: 98%  99% 99%  Weight:  86 kg    Height:  5\' 5"  (1.651 m)     No data found.   Intake/Output Summary (Last 24 hours) at 04/22/2021 1140 Last data filed at 04/22/2021 0750 Gross per 24 hour  Intake --  Output 600 ml  Net -600 ml   Filed Weights   04/21/21 0527 04/21/21 2257  Weight: 88 kg 86 kg    Exam:  GEN: NAD SKIN: Warm and dry.  He is wearing a LifeVest EYES: EOMI ENT: MMM CV: RRR PULM: CTA B ABD: soft, ND, NT, +BS CNS: AAO x 3, non focal EXT: No edema or tenderness        Data Reviewed:   I have personally reviewed following labs and imaging studies:  Labs: Labs show the following:   Basic Metabolic Panel: Recent Labs  Lab 04/21/21 0529 04/22/21 0614  NA 132* 137  K 3.7 4.0  CL 104 106  CO2 22 26  GLUCOSE 125* 134*  BUN 17 21  CREATININE 1.01 0.97  CALCIUM 8.3* 8.7*  MG 1.4*  --    GFR Estimated Creatinine Clearance: 69.4 mL/min (by C-G formula based on SCr of 0.97 mg/dL). Liver Function Tests: Recent Labs  Lab 04/21/21 0529 04/22/21 0614  AST 25 21  ALT 17 17  ALKPHOS 63 56  BILITOT 1.0 0.6  PROT 6.7 6.8  ALBUMIN 3.3* 3.2*   No results for input(s): LIPASE, AMYLASE in the last 168 hours. No results for input(s): AMMONIA in the last 168 hours. Coagulation profile Recent Labs  Lab 04/21/21 0543  INR 1.1    CBC: Recent Labs  Lab 04/21/21 0528  WBC 3.4*  NEUTROABS 1.7  HGB 13.0  HCT 39.5  MCV 93.4  PLT 156   Cardiac Enzymes: No results for input(s): CKTOTAL, CKMB, CKMBINDEX, TROPONINI in the last 168 hours. BNP (last 3 results) No results for input(s): PROBNP in the last 8760 hours. CBG: Recent Labs  Lab 04/21/21 0955 04/21/21 1204 04/21/21 1650 04/21/21 2256 04/22/21 0748  GLUCAP 162* 342* 245* 175* 114*   D-Dimer: Recent Labs    04/21/21 1105  DDIMER 0.75*   Hgb A1c: No results for input(s): HGBA1C in the last 72 hours. Lipid Profile: No results for input(s): CHOL,  HDL, LDLCALC, TRIG, CHOLHDL, LDLDIRECT in the last 72 hours. Thyroid function studies: No results for input(s): TSH, T4TOTAL, T3FREE, THYROIDAB in the last 72 hours.  Invalid input(s): FREET3 Anemia work up: No results for input(s): VITAMINB12, FOLATE, FERRITIN, TIBC, IRON,  RETICCTPCT in the last 72 hours. Sepsis Labs: Recent Labs  Lab 04/21/21 0528 04/21/21 0529 04/21/21 0543 04/21/21 0623  PROCALCITON  --  <0.10  --   --   WBC 3.4*  --   --   --   LATICACIDVEN  --   --  1.0 1.0    Microbiology Recent Results (from the past 240 hour(s))  Blood culture (routine x 2)     Status: None (Preliminary result)   Collection Time: 04/21/21  6:22 AM   Specimen: Bronchial Wash; Blood  Result Value Ref Range Status   Specimen Description BRONCHIAL WASHINGS  Final   Special Requests   Final    BOTTLES DRAWN AEROBIC AND ANAEROBIC Blood Culture adequate volume   Culture   Final    NO GROWTH < 24 HOURS Performed at [facility], [address] [place], [place] [address]    Report Status PENDING  Incomplete  Blood culture (routine x 2)     Status: None (Preliminary result)   Collection Time: 04/21/21  6:23 AM   Specimen: BLOOD LEFT HAND  Result Value Ref Range Status   Specimen Description BLOOD LEFT HAND  Final   Special Requests   Final    BOTTLES DRAWN AEROBIC AND ANAEROBIC Blood Culture adequate volume   Culture   Final    NO GROWTH < 24 HOURS Performed at [facility], [address] [place], [place] [address]    Report Status PENDING  Incomplete    Procedures and diagnostic studies:  DG Neck Soft Tissue  Result Date: 04/21/2021 CLINICAL DATA:  70 year old male with wheezing. Shortness of breath. Heart surgery in October. EXAM: NECK SOFT TISSUES - 1+ VIEW COMPARISON:  CT cervical spine 10/27/2010. FINDINGS: Large chronic anterior endplate osteophyte at Z5-G3. Prominent chronic C2-C3 endplate osteophytosis also. Normal prevertebral soft tissue contour.  Pharyngeal and epiglottis contours within normal limits. Bilateral cervical carotid calcified atherosclerosis. Visualized tracheal air column is within normal limits. No acute osseous abnormality identified. Negative visible upper chest. IMPRESSION: Negative; calcified carotid atherosclerosis and large chronic anterior cervical endplate osteophytes. Electronically Signed   By: Genevie Ann M.D.   On: 04/21/2021 06:00   DG Chest 2 View  Result Date: 04/21/2021 CLINICAL DATA:  70 year old male with wheezing. Shortness of breath. Heart surgery in October. EXAM: CHEST - 2 VIEW COMPARISON:  04/28/2017. FINDINGS: PA and lateral views of the chest. Interval CABG. Mediastinal contours remain within normal limits. Stable lung volumes. No pneumothorax, pulmonary edema, pleural effusion or confluent pulmonary opacity. Visualized tracheal air column is within normal limits. No acute osseous abnormality identified. Stable cholecystectomy clips. Paucity of bowel gas in the upper abdomen. IMPRESSION: No acute cardiopulmonary abnormality. Electronically Signed   By: Genevie Ann M.D.   On: 04/21/2021 05:58   CT CHEST WO CONTRAST  Result Date: 04/21/2021 CLINICAL DATA:  Pneumonia, complication suspected. Patient has been wheezing for a few days. Open heart surgery in October. Patient tested COVID positive 2 days ago. EXAM: CT CHEST WITHOUT CONTRAST TECHNIQUE: Multidetector CT imaging of the chest was performed following the standard protocol without IV contrast. COMPARISON:  None. FINDINGS: Cardiovascular: The heart is mildly enlarged. Evidence of recent cardiac surgery. No significant pericardial effusion. Mediastinum/Nodes: No enlarged mediastinal or axillary lymph nodes. Thyroid gland, trachea, and esophagus demonstrate no significant findings. Lungs/Pleura: No evidence of pneumonia. Subsegmental atelectasis along the right major fissure. Bibasilar atelectasis. Small left pleural effusion. No evidence of pulmonary edema. Upper  Abdomen: Status post cholecystectomy.  Calcification about the right adrenal. Mild pancreatic atrophy. No acute upper abdominal process. Musculoskeletal: Sternotomy wires are intact. Diffuse idiopathic skeletal hyperostosis. IMPRESSION: 1.  Cardiomegaly with postsurgical changes. 2. Bibasilar atelectasis without evidence of pneumonia or pulmonary edema. 3.  Small left pleural effusion. Electronically Signed   By: Keane Police D.O.   On: 04/21/2021 10:08               LOS: 1 day   Stephan Nelis  [facility]   Pager on [email or web address]. If 7PM-7AM, please contact night-coverage at [email or web address]     04/22/2021, 11:40 AM

## 2021-04-22 NOTE — Progress Notes (Signed)
Inpatient Diabetes Program Recommendations  AACE/ADA: New Consensus Statement on Inpatient Glycemic Control (2015)  Target Ranges:  Prepandial:   less than 140 mg/dL      Peak postprandial:   less than 180 mg/dL (1-2 hours)      Critically ill patients:  140 - 180 mg/dL   Lab Results  Component Value Date   GLUCAP 114 (H) 04/22/2021    Review of Glycemic Control  Latest Reference Range & Units 04/21/21 09:55 04/21/21 12:04 04/21/21 16:50 04/21/21 22:56 04/22/21 07:48  Glucose-Capillary 70 - 99 mg/dL 162 (H) 342 (H) 245 (H) 175 (H) 114 (H)   Diabetes history: DM 2 Outpatient Diabetes medications: Metformin 1000 mg bid, (Jardiance, Ozempic, and glipizide in the past) Current orders for Inpatient glycemic control:  Levemir 9 units bid Novolog 0-15 units tid   Decadron 6 mg Daily  Inpatient Diabetes Program Recommendations:    Glucose trends increase after steroid dose and meals -  consider adding Novolog 3 units tid meal coverage if eating >50% of meals  Thanks,  Tama Headings RN, MSN, BC-ADM Inpatient Diabetes Coordinator Team Pager [phone] (8a-5p)

## 2021-04-23 DIAGNOSIS — R778 Other specified abnormalities of plasma proteins: Secondary | ICD-10-CM

## 2021-04-23 DIAGNOSIS — J9601 Acute respiratory failure with hypoxia: Secondary | ICD-10-CM

## 2021-04-23 LAB — COMPREHENSIVE METABOLIC PANEL
ALT: 18 U/L (ref 0–44)
AST: 21 U/L (ref 15–41)
Albumin: 3.1 g/dL — ABNORMAL LOW (ref 3.5–5.0)
Alkaline Phosphatase: 55 U/L (ref 38–126)
Anion gap: 6 (ref 5–15)
BUN: 25 mg/dL — ABNORMAL HIGH (ref 8–23)
CO2: 26 mmol/L (ref 22–32)
Calcium: 8.6 mg/dL — ABNORMAL LOW (ref 8.9–10.3)
Chloride: 105 mmol/L (ref 98–111)
Creatinine, Ser: 1.01 mg/dL (ref 0.61–1.24)
GFR, Estimated: >60 mL/min (ref >60)
Glucose, Bld: 157 mg/dL — ABNORMAL HIGH (ref 70–99)
Potassium: 3.7 mmol/L (ref 3.5–5.1)
Sodium: 137 mmol/L (ref 135–145)
Total Bilirubin: 0.5 mg/dL (ref 0.3–1.2)
Total Protein: 6.5 g/dL (ref 6.5–8.1)

## 2021-04-23 LAB — GLUCOSE, CAPILLARY
Glucose-Capillary: 143 mg/dL — ABNORMAL HIGH (ref 70–99)
Glucose-Capillary: 331 mg/dL — ABNORMAL HIGH (ref 70–99)

## 2021-04-23 MED ORDER — GUAIFENESIN-DM 100-10 MG/5ML PO SYRP: 10.0000 mL | ORAL_SOLUTION | ORAL | 0 refills | Status: DC | PRN

## 2021-04-23 MED ORDER — ASCORBIC ACID 500 MG PO TABS: 500.0000 mg | ORAL_TABLET | Freq: Every day | ORAL | 0 refills | Status: DC

## 2021-04-23 MED ORDER — ZINC SULFATE 220 (50 ZN) MG PO CAPS: 220.0000 mg | ORAL_CAPSULE | Freq: Every day | ORAL | 0 refills | Status: DC

## 2021-04-23 NOTE — TOC Transition Note (Signed)
Transition of Care [facility]) - CM/SW Discharge Note   Patient Details  Name: Jermaine Kramer MRN: [id number] Date of Birth: Oct 06, 1950  Transition of Care [facility]) CM/SW Contact:  Jermaine Price, RN Phone Number: 04/23/2021, 10:56 AM   Clinical Narrative:  Admit 12/22 with Covid dx.  Patient has discharge orders today to Home/Self Care. Currently on Room Air at 95% saturation value at 1022 assessment documentation by Unit RN. No DME oxygen discharge orders. Jermaine Davies RN CM    Final next level of care: Home/Self Care Barriers to Discharge: No Barriers Identified   Patient Goals and CMS Choice        Discharge Placement                       Discharge Plan and Services                DME Arranged: N/A DME Agency: NA         HH Agency: NA        Social Determinants of Health (SDOH) Interventions     Readmission Risk Interventions No flowsheet data found.

## 2021-04-23 NOTE — Progress Notes (Signed)
AVS reviewed with patient and spouse at bedside.  All questions answered.  AVS given to spouse.

## 2021-04-24 NOTE — Discharge Summary (Signed)
Physician Discharge Summary   Patient: Jermaine Kramer MRN: [id number] DOB: @DOB   Admit date:     04/21/2021  Discharge date: 04/23/2021  Discharge Physician: Max Sane   PCP: Sherre Scarlet, PA-C   Recommendations at discharge: 1. follow-up with outpatient providers as requested  Discharge Diagnoses Principal Problem:   COVID Active Problems:   Diabetes mellitus without complication (HCC)   GERD (gastroesophageal reflux disease)   Hypertension   CAD (coronary artery disease)   AF (paroxysmal atrial fibrillation) (HCC)   Acute respiratory failure with hypoxia ([place])   Troponin I above reference range  Hospital Course    70 y.o. male with medical history significant for CAD s/p CABG, postoperative atrial fibrillation on Eliquis, ischemic cardiomyopathy with EF of 25-35 % and has a LifeVest in place, diabetes mellitus, hypertension.  He presented to the hospital because of worsening shortness of breath and wheezing.  He had a productive cough, chest congestion, myalgia and generalized weakness ED on 04/18/2021.  He tested positive for COVID-19 infection on 04/20/2021 and he was started on Molnupiravir.,  His condition could worsen so he presented to the emergency room for further management. He was admitted to the hospital for COVID-19 infection and acute hypoxemic respiratory failure.  Oxygen saturation was 88% on room air.  He was treated with IV dexamethasone and IV remdesivir.  COVID-19 infection: Treated with IV remdesivir and IV dexamethasone with good response.  He is on room air now and feeling much better.  He prefers to go home which is reasonable.  He is agreeable with discharge plans.  Acute hypoxemic respiratory failure: Required 2 L oxygen on admission and is weaned off to room air.   CAD s/p CABG in October 2022, ischemic cardiomyopathy: Estimated EF 25 to 35% on TEE in October 2022.  Continue aspirin and Lipitor.  He is wearing a LifeVest.  Paroxysmal atrial  fibrillation: Continue metoprolol, amiodarone and Eliquis  Insulin-dependent diabetes mellitus with hyperglycemia: Continue home regimen Pain control - [place] Controlled Substance Reporting System database was reviewed. and patient was instructed, not to drive, operate heavy machinery, perform activities at heights, swimming or participation in water activities or provide baby-sitting services while on Pain, Sleep and Anxiety Medications; until their outpatient Physician has advised to do so again. Also recommended to not to take more than prescribed Pain, Sleep and Anxiety Medications.    Disposition: Home Diet recommendation: Carb modified diet  DISCHARGE MEDICATION: Allergies as of 04/23/2021   No Known Allergies      Medication List     STOP taking these medications    amoxicillin-clavulanate 875-125 MG tablet Commonly known as: AUGMENTIN   glipiZIDE 5 MG tablet Commonly known as: GLUCOTROL   Jardiance 10 MG Tabs tablet Generic drug: empagliflozin   lisinopril-hydrochlorothiazide 20-12.5 MG tablet Commonly known as: ZESTORETIC   olmesartan 40 MG tablet Commonly known as: BENICAR   Ozempic (0.25 or 0.5 MG/DOSE) 2 MG/1.5ML Sopn Generic drug: Semaglutide(0.25 or 0.5MG /DOS)   potassium chloride 10 MEQ tablet Commonly known as: KLOR-CON   spironolactone 25 MG tablet Commonly known as: ALDACTONE       TAKE these medications    amiodarone 200 MG tablet Commonly known as: PACERONE Take 200 mg by mouth daily.   amLODipine 5 MG tablet Commonly known as: NORVASC Take 5 mg by mouth daily.   ascorbic acid 500 MG tablet Commonly known as: VITAMIN C Take 1 tablet (500 mg total) by mouth daily.   aspirin EC 81 MG  tablet Take 81 mg by mouth daily.   atorvastatin 80 MG tablet Commonly known as: LIPITOR Take 80 mg by mouth daily. What changed: Another medication with the same name was removed. Continue taking this medication, and follow the directions you  see here.   Eliquis 5 MG Tabs tablet Generic drug: apixaban Take 1 tablet by mouth in the morning and at bedtime.   furosemide 20 MG tablet Commonly known as: LASIX Take 1 tablet by mouth daily.   guaiFENesin-dextromethorphan 100-10 MG/5ML syrup Commonly known as: ROBITUSSIN DM Take 10 mLs by mouth every 4 (four) hours as needed for cough.   lisinopril 2.5 MG tablet Commonly known as: ZESTRIL Take 2.5 mg by mouth daily.   metFORMIN 1000 MG tablet Commonly known as: GLUCOPHAGE Take 1,000 mg by mouth 2 (two) times daily with a meal.   metoprolol succinate 25 MG 24 hr tablet Commonly known as: TOPROL-XL Take 1 tablet by mouth daily.   omeprazole 40 MG capsule Commonly known as: PRILOSEC Take 40 mg by mouth daily.   tamsulosin 0.4 MG Caps capsule Commonly known as: FLOMAX Take 1 tablet by mouth daily.   zinc sulfate 220 (50 Zn) MG capsule Take 1 capsule (220 mg total) by mouth daily.        Follow-up Information     Sena Hitch Claiborne Billings, PA-C. Schedule an appointment as soon as possible for a visit in 3 day(s).   Specialty: Physician Assistant Why: [facility] Discharge F/UP Patient to make own follow up appt office closed at this time Contact information: [place] [address] [phone]                 Discharge Exam: [place] Weights   04/21/21 0527 04/21/21 2257  Weight: 88 kg 86 kg    GEN: NAD SKIN: Warm and dry.  He is wearing a LifeVest EYES: EOMI ENT: MMM CV: RRR PULM: CTA B ABD: soft, ND, NT, +BS CNS: AAO x 3, non focal EXT: No edema or tenderness  Condition at discharge: good  The results of significant diagnostics from this hospitalization (including imaging, microbiology, ancillary and laboratory) are listed below for reference.   Imaging Studies: DG Neck Soft Tissue  Result Date: 04/21/2021 CLINICAL DATA:  70 year old male with wheezing. Shortness of breath. Heart surgery in October. EXAM: NECK SOFT TISSUES -  1+ VIEW COMPARISON:  CT cervical spine 10/27/2010. FINDINGS: Large chronic anterior endplate osteophyte at Q4-O9. Prominent chronic C2-C3 endplate osteophytosis also. Normal prevertebral soft tissue contour. Pharyngeal and epiglottis contours within normal limits. Bilateral cervical carotid calcified atherosclerosis. Visualized tracheal air column is within normal limits. No acute osseous abnormality identified. Negative visible upper chest. IMPRESSION: Negative; calcified carotid atherosclerosis and large chronic anterior cervical endplate osteophytes. Electronically Signed   By: Genevie Ann M.D.   On: 04/21/2021 06:00   DG Chest 2 View  Result Date: 04/21/2021 CLINICAL DATA:  70 year old male with wheezing. Shortness of breath. Heart surgery in October. EXAM: CHEST - 2 VIEW COMPARISON:  04/28/2017. FINDINGS: PA and lateral views of the chest. Interval CABG. Mediastinal contours remain within normal limits. Stable lung volumes. No pneumothorax, pulmonary edema, pleural effusion or confluent pulmonary opacity. Visualized tracheal air column is within normal limits. No acute osseous abnormality identified. Stable cholecystectomy clips. Paucity of bowel gas in the upper abdomen. IMPRESSION: No acute cardiopulmonary abnormality. Electronically Signed   By: Genevie Ann M.D.   On: 04/21/2021 05:58   CT CHEST WO CONTRAST  Result Date:  04/21/2021 CLINICAL DATA:  Pneumonia, complication suspected. Patient has been wheezing for a few days. Open heart surgery in October. Patient tested COVID positive 2 days ago. EXAM: CT CHEST WITHOUT CONTRAST TECHNIQUE: Multidetector CT imaging of the chest was performed following the standard protocol without IV contrast. COMPARISON:  None. FINDINGS: Cardiovascular: The heart is mildly enlarged. Evidence of recent cardiac surgery. No significant pericardial effusion. Mediastinum/Nodes: No enlarged mediastinal or axillary lymph nodes. Thyroid gland, trachea, and esophagus demonstrate no  significant findings. Lungs/Pleura: No evidence of pneumonia. Subsegmental atelectasis along the right major fissure. Bibasilar atelectasis. Small left pleural effusion. No evidence of pulmonary edema. Upper Abdomen: Status post cholecystectomy. Calcification about the right adrenal. Mild pancreatic atrophy. No acute upper abdominal process. Musculoskeletal: Sternotomy wires are intact. Diffuse idiopathic skeletal hyperostosis. IMPRESSION: 1.  Cardiomegaly with postsurgical changes. 2. Bibasilar atelectasis without evidence of pneumonia or pulmonary edema. 3.  Small left pleural effusion. Electronically Signed   By: Keane Police D.O.   On: 04/21/2021 10:08    Microbiology: Results for orders placed or performed during the hospital encounter of 04/21/21  Blood culture (routine x 2)     Status: None (Preliminary result)   Collection Time: 04/21/21  6:22 AM   Specimen: Bronchial Wash; Blood  Result Value Ref Range Status   Specimen Description BRONCHIAL WASHINGS  Final   Special Requests   Final    BOTTLES DRAWN AEROBIC AND ANAEROBIC Blood Culture adequate volume   Culture   Final    NO GROWTH 3 DAYS Performed at [facility], [address] [place], [place] [address]    Report Status PENDING  Incomplete  Blood culture (routine x 2)     Status: None (Preliminary result)   Collection Time: 04/21/21  6:23 AM   Specimen: BLOOD LEFT HAND  Result Value Ref Range Status   Specimen Description BLOOD LEFT HAND  Final   Special Requests   Final    BOTTLES DRAWN AEROBIC AND ANAEROBIC Blood Culture adequate volume   Culture   Final    NO GROWTH 3 DAYS Performed at [facility], [address] [place], [place] [address]    Report Status PENDING  Incomplete    Labs: CBC: Recent Labs  Lab 04/21/21 0528  WBC 3.4*  NEUTROABS 1.7  HGB 13.0  HCT 39.5  MCV 93.4  PLT 035   Basic Metabolic Panel: Recent Labs  Lab 04/21/21 0529 04/22/21 0614 04/23/21 0622  NA 132* 137 137   K 3.7 4.0 3.7  CL 104 106 105  CO2 22 26 26   GLUCOSE 125* 134* 157*  BUN 17 21 25*  CREATININE 1.01 0.97 1.01  CALCIUM 8.3* 8.7* 8.6*  MG 1.4*  --   --    Liver Function Tests: Recent Labs  Lab 04/21/21 0529 04/22/21 0614 04/23/21 0622  AST 25 21 21   ALT 17 17 18   ALKPHOS 63 56 55  BILITOT 1.0 0.6 0.5  PROT 6.7 6.8 6.5  ALBUMIN 3.3* 3.2* 3.1*   CBG: Recent Labs  Lab 04/22/21 1233 04/22/21 1633 04/22/21 2125 04/23/21 0819 04/23/21 1254  GLUCAP 276* 191* 249* 143* 331*    Discharge time spent: greater than 30 minutes.  Signed:  Max Sane MD.  [facility] 04/24/2021

## 2021-04-26 LAB — CULTURE, BLOOD (ROUTINE X 2)
Culture: NO GROWTH
Culture: NO GROWTH
Special Requests: ADEQUATE
Special Requests: ADEQUATE

## 2021-05-04 DIAGNOSIS — R339 Retention of urine, unspecified: Secondary | ICD-10-CM | POA: Insufficient documentation

## 2021-05-14 ENCOUNTER — Other Ambulatory Visit: Payer: Self-pay

## 2021-05-14 ENCOUNTER — Emergency Department: Payer: Medicare Other

## 2021-05-14 ENCOUNTER — Encounter: Payer: Self-pay | Admitting: Intensive Care

## 2021-05-14 ENCOUNTER — Inpatient Hospital Stay
Admission: EM | Admit: 2021-05-14 | Discharge: 2021-05-18 | DRG: 291 | Disposition: A | Discharge status: Home Health | Payer: Medicare Other | Attending: Internal Medicine | Admitting: Internal Medicine

## 2021-05-14 DIAGNOSIS — I420 Dilated cardiomyopathy: Secondary | ICD-10-CM | POA: Diagnosis present

## 2021-05-14 DIAGNOSIS — I4891 Unspecified atrial fibrillation: Secondary | ICD-10-CM | POA: Diagnosis not present

## 2021-05-14 DIAGNOSIS — E785 Hyperlipidemia, unspecified: Secondary | ICD-10-CM

## 2021-05-14 DIAGNOSIS — U071 COVID-19: Secondary | ICD-10-CM | POA: Diagnosis present

## 2021-05-14 DIAGNOSIS — Z8249 Family history of ischemic heart disease and other diseases of the circulatory system: Secondary | ICD-10-CM | POA: Diagnosis not present

## 2021-05-14 DIAGNOSIS — I2510 Atherosclerotic heart disease of native coronary artery without angina pectoris: Secondary | ICD-10-CM | POA: Diagnosis present

## 2021-05-14 DIAGNOSIS — I110 Hypertensive heart disease with heart failure: Secondary | ICD-10-CM | POA: Diagnosis present

## 2021-05-14 DIAGNOSIS — Z7901 Long term (current) use of anticoagulants: Secondary | ICD-10-CM

## 2021-05-14 DIAGNOSIS — I480 Paroxysmal atrial fibrillation: Secondary | ICD-10-CM | POA: Diagnosis present

## 2021-05-14 DIAGNOSIS — Z7984 Long term (current) use of oral hypoglycemic drugs: Secondary | ICD-10-CM

## 2021-05-14 DIAGNOSIS — Z8616 Personal history of COVID-19: Secondary | ICD-10-CM

## 2021-05-14 DIAGNOSIS — I10 Essential (primary) hypertension: Secondary | ICD-10-CM | POA: Diagnosis not present

## 2021-05-14 DIAGNOSIS — J9601 Acute respiratory failure with hypoxia: Secondary | ICD-10-CM | POA: Diagnosis present

## 2021-05-14 DIAGNOSIS — I509 Heart failure, unspecified: Secondary | ICD-10-CM

## 2021-05-14 DIAGNOSIS — E119 Type 2 diabetes mellitus without complications: Secondary | ICD-10-CM | POA: Diagnosis present

## 2021-05-14 DIAGNOSIS — N400 Enlarged prostate without lower urinary tract symptoms: Secondary | ICD-10-CM | POA: Diagnosis present

## 2021-05-14 DIAGNOSIS — N179 Acute kidney failure, unspecified: Secondary | ICD-10-CM | POA: Diagnosis present

## 2021-05-14 DIAGNOSIS — I255 Ischemic cardiomyopathy: Secondary | ICD-10-CM | POA: Diagnosis present

## 2021-05-14 DIAGNOSIS — Z79899 Other long term (current) drug therapy: Secondary | ICD-10-CM

## 2021-05-14 DIAGNOSIS — I493 Ventricular premature depolarization: Secondary | ICD-10-CM | POA: Diagnosis not present

## 2021-05-14 DIAGNOSIS — K219 Gastro-esophageal reflux disease without esophagitis: Secondary | ICD-10-CM | POA: Diagnosis present

## 2021-05-14 DIAGNOSIS — R0602 Shortness of breath: Secondary | ICD-10-CM | POA: Diagnosis present

## 2021-05-14 DIAGNOSIS — Z8673 Personal history of transient ischemic attack (TIA), and cerebral infarction without residual deficits: Secondary | ICD-10-CM

## 2021-05-14 DIAGNOSIS — Z951 Presence of aortocoronary bypass graft: Secondary | ICD-10-CM

## 2021-05-14 DIAGNOSIS — I4510 Unspecified right bundle-branch block: Secondary | ICD-10-CM | POA: Diagnosis not present

## 2021-05-14 DIAGNOSIS — Z7982 Long term (current) use of aspirin: Secondary | ICD-10-CM

## 2021-05-14 DIAGNOSIS — I5021 Acute systolic (congestive) heart failure: Secondary | ICD-10-CM | POA: Diagnosis not present

## 2021-05-14 DIAGNOSIS — I5023 Acute on chronic systolic (congestive) heart failure: Secondary | ICD-10-CM | POA: Diagnosis present

## 2021-05-14 DIAGNOSIS — Z8549 Personal history of malignant neoplasm of other male genital organs: Secondary | ICD-10-CM

## 2021-05-14 DIAGNOSIS — E7800 Pure hypercholesterolemia, unspecified: Secondary | ICD-10-CM | POA: Diagnosis present

## 2021-05-14 DIAGNOSIS — Z856 Personal history of leukemia: Secondary | ICD-10-CM

## 2021-05-14 DIAGNOSIS — I5022 Chronic systolic (congestive) heart failure: Secondary | ICD-10-CM | POA: Diagnosis not present

## 2021-05-14 HISTORY — DX: Heart failure, unspecified: I50.9

## 2021-05-14 LAB — CBC
HCT: 39.9 % (ref 39.0–52.0)
Hemoglobin: 13.1 g/dL (ref 13.0–17.0)
MCH: 30.3 pg (ref 26.0–34.0)
MCHC: 32.8 g/dL (ref 30.0–36.0)
MCV: 92.4 fL (ref 80.0–100.0)
Platelets: 171 10*3/uL (ref 150–400)
RBC: 4.32 MIL/uL (ref 4.22–5.81)
RDW: 12.9 % (ref 11.5–15.5)
WBC: 6 10*3/uL (ref 4.0–10.5)
nRBC: 0 % (ref 0.0–0.2)

## 2021-05-14 LAB — BASIC METABOLIC PANEL
Anion gap: 9 (ref 5–15)
BUN: 14 mg/dL (ref 8–23)
CO2: 23 mmol/L (ref 22–32)
Calcium: 8.5 mg/dL — ABNORMAL LOW (ref 8.9–10.3)
Chloride: 104 mmol/L (ref 98–111)
Creatinine, Ser: 0.93 mg/dL (ref 0.61–1.24)
GFR, Estimated: >60 mL/min (ref >60)
Glucose, Bld: 185 mg/dL — ABNORMAL HIGH (ref 70–99)
Potassium: 4.1 mmol/L (ref 3.5–5.1)
Sodium: 136 mmol/L (ref 135–145)

## 2021-05-14 LAB — BRAIN NATRIURETIC PEPTIDE: B Natriuretic Peptide: 1172.8 pg/mL — ABNORMAL HIGH (ref 0.0–100.0)

## 2021-05-14 LAB — TROPONIN I (HIGH SENSITIVITY)
Troponin I (High Sensitivity): 28 ng/L — ABNORMAL HIGH (ref <18)
Troponin I (High Sensitivity): 30 ng/L — ABNORMAL HIGH (ref <18)

## 2021-05-14 LAB — PROTIME-INR
INR: 1.1 (ref 0.8–1.2)
Prothrombin Time: 14.3 seconds (ref 11.4–15.2)

## 2021-05-14 MED ORDER — TRAZODONE HCL 50 MG PO TABS
25.0000 mg | ORAL_TABLET | Freq: Every evening | ORAL | Status: DC | PRN
  Administered 2021-05-17 (×2): 25 mg via ORAL
  Filled 2021-05-14 (×2): qty 1

## 2021-05-14 MED ORDER — ASCORBIC ACID 500 MG PO TABS
500.0000 mg | ORAL_TABLET | Freq: Every day | ORAL | Status: DC
  Administered 2021-05-15 – 2021-05-18 (×4): 500 mg via ORAL
  Filled 2021-05-14 (×4): qty 1

## 2021-05-14 MED ORDER — ONDANSETRON HCL 4 MG/2ML IJ SOLN: 4.0000 mg | Freq: Four times a day (QID) | INTRAMUSCULAR | Status: DC | PRN

## 2021-05-14 MED ORDER — ONDANSETRON HCL 4 MG PO TABS: 4.0000 mg | ORAL_TABLET | Freq: Four times a day (QID) | ORAL | Status: DC | PRN

## 2021-05-14 MED ORDER — ASPIRIN EC 81 MG PO TBEC
81.0000 mg | DELAYED_RELEASE_TABLET | Freq: Every day | ORAL | Status: DC
  Administered 2021-05-15 – 2021-05-18 (×4): 81 mg via ORAL
  Filled 2021-05-14 (×4): qty 1

## 2021-05-14 MED ORDER — APIXABAN 5 MG PO TABS
5.0000 mg | ORAL_TABLET | Freq: Two times a day (BID) | ORAL | Status: DC
  Administered 2021-05-15 – 2021-05-18 (×8): 5 mg via ORAL
  Filled 2021-05-14 (×8): qty 1

## 2021-05-14 MED ORDER — TAMSULOSIN HCL 0.4 MG PO CAPS
0.4000 mg | ORAL_CAPSULE | Freq: Every day | ORAL | Status: DC
  Administered 2021-05-15 – 2021-05-18 (×4): 0.4 mg via ORAL
  Filled 2021-05-14 (×4): qty 1

## 2021-05-14 MED ORDER — FUROSEMIDE 10 MG/ML IJ SOLN
60.0000 mg | Freq: Two times a day (BID) | INTRAMUSCULAR | Status: DC
  Administered 2021-05-15 – 2021-05-16 (×3): 60 mg via INTRAVENOUS
  Filled 2021-05-14 (×3): qty 8

## 2021-05-14 MED ORDER — METFORMIN HCL 500 MG PO TABS
1000.0000 mg | ORAL_TABLET | Freq: Two times a day (BID) | ORAL | Status: DC
  Administered 2021-05-15 – 2021-05-16 (×4): 1000 mg via ORAL
  Filled 2021-05-14 (×4): qty 2

## 2021-05-14 MED ORDER — ATORVASTATIN CALCIUM 80 MG PO TABS
80.0000 mg | ORAL_TABLET | Freq: Every day | ORAL | Status: DC
  Administered 2021-05-15 – 2021-05-18 (×4): 80 mg via ORAL
  Filled 2021-05-14: qty 1
  Filled 2021-05-14 (×2): qty 4
  Filled 2021-05-14: qty 1

## 2021-05-14 MED ORDER — AMIODARONE HCL 200 MG PO TABS
200.0000 mg | ORAL_TABLET | Freq: Every day | ORAL | Status: DC
  Administered 2021-05-15 – 2021-05-18 (×4): 200 mg via ORAL
  Filled 2021-05-14 (×4): qty 1

## 2021-05-14 MED ORDER — MAGNESIUM HYDROXIDE 400 MG/5ML PO SUSP: 30.0000 mL | Freq: Every day | ORAL | Status: DC | PRN

## 2021-05-14 MED ORDER — METOPROLOL SUCCINATE ER 25 MG PO TB24
25.0000 mg | ORAL_TABLET | Freq: Every day | ORAL | Status: DC
  Administered 2021-05-15 – 2021-05-16 (×2): 25 mg via ORAL
  Filled 2021-05-14 (×2): qty 1

## 2021-05-14 MED ORDER — AMLODIPINE BESYLATE 5 MG PO TABS
5.0000 mg | ORAL_TABLET | Freq: Every day | ORAL | Status: DC
  Administered 2021-05-15 – 2021-05-18 (×3): 5 mg via ORAL
  Filled 2021-05-14 (×4): qty 1

## 2021-05-14 MED ORDER — ACETAMINOPHEN 325 MG PO TABS: 650.0000 mg | ORAL_TABLET | Freq: Four times a day (QID) | ORAL | Status: DC | PRN

## 2021-05-14 MED ORDER — PANTOPRAZOLE SODIUM 40 MG PO TBEC
40.0000 mg | DELAYED_RELEASE_TABLET | Freq: Every day | ORAL | Status: DC
  Administered 2021-05-15 – 2021-05-18 (×4): 40 mg via ORAL
  Filled 2021-05-14 (×4): qty 1

## 2021-05-14 MED ORDER — LISINOPRIL 5 MG PO TABS
2.5000 mg | ORAL_TABLET | Freq: Every day | ORAL | Status: DC
  Administered 2021-05-15 – 2021-05-16 (×2): 2.5 mg via ORAL
  Filled 2021-05-14 (×2): qty 1

## 2021-05-14 MED ORDER — GUAIFENESIN-DM 100-10 MG/5ML PO SYRP: 10.0000 mL | ORAL_SOLUTION | ORAL | Status: DC | PRN

## 2021-05-14 MED ORDER — ACETAMINOPHEN 650 MG RE SUPP: 650.0000 mg | Freq: Four times a day (QID) | RECTAL | Status: DC | PRN

## 2021-05-14 MED ORDER — FUROSEMIDE 10 MG/ML IJ SOLN
60.0000 mg | Freq: Once | INTRAMUSCULAR | Status: AC
Start: 2021-05-14 — End: 2021-05-14
  Administered 2021-05-14: 60 mg via INTRAVENOUS
  Filled 2021-05-14: qty 8

## 2021-05-14 MED ORDER — ZINC SULFATE 220 (50 ZN) MG PO CAPS
220.0000 mg | ORAL_CAPSULE | Freq: Every day | ORAL | Status: DC
  Administered 2021-05-15 – 2021-05-18 (×4): 220 mg via ORAL
  Filled 2021-05-14 (×4): qty 1

## 2021-05-14 NOTE — ED Provider Notes (Signed)
[facility] Provider Note    Event Date/Time   First MD Initiated Contact with Patient 05/14/21 2052     (approximate)   History   Shortness of Breath   HPI  Jermaine Kramer is a 71 y.o. male who presents to the ED for evaluation of Shortness of Breath   History of CAD s/p CABG and associated reduced ejection fraction and CHF.  CABG was done in November and he was admitted last month due to COVID-19 and associated hypoxia.  I reviewed the DC summary from 12/24.  Patient presents to the ED, accompanied by his wife, for evaluation of increasing shortness of breath and orthopnea.  Wife reports ongoing wheezing, improved with EMS DuoNebs and this is helped but he still seems short of breath and swollen.  Physical Exam   Triage Vital Signs: ED Triage Vitals  Enc Vitals Group     BP 05/14/21 1700 125/78     Pulse Rate 05/14/21 1700 87     Resp 05/14/21 1700 (!) 22     Temp 05/14/21 1700 99.3 F (37.4 C)     Temp Source 05/14/21 1700 Oral     SpO2 05/14/21 1659 90 %     Weight 05/14/21 1701 192 lb (87.1 kg)     Height 05/14/21 1701 5\' 5"  (1.651 m)     Head Circumference --      Peak Flow --      Pain Score 05/14/21 1700 0     Pain Loc --      Pain Edu? --      Excl. in [place]? --     Most recent vital signs: Vitals:   05/14/21 1927 05/14/21 2100  BP: 131/85 (!) 114/104  Pulse: 89 87  Resp: 14 17  Temp: 98.6 F (37 C)   SpO2: 90% 94%    General: Awake, no distress.  Sitting upright in bed, pleasant and conversational in full sentences. CV:  Good peripheral perfusion.  Resp:  Normal effort.  Faint bibasilar crackles are present.  No wheezing. Abd:  No distention.  MSK:  No deformity noted.  Trace pitting edema to bilateral lower extremities. Neuro:  No focal deficits appreciated. Other:     ED Results / Procedures / Treatments   Labs (all labs ordered are listed, but only abnormal results are displayed) Labs Reviewed  BASIC METABOLIC  PANEL - Abnormal; Notable for the following components:      Result Value   Glucose, Bld 185 (*)    Calcium 8.5 (*)    All other components within normal limits  BRAIN NATRIURETIC PEPTIDE - Abnormal; Notable for the following components:   B Natriuretic Peptide 1,172.8 (*)    All other components within normal limits  TROPONIN I (HIGH SENSITIVITY) - Abnormal; Notable for the following components:   Troponin I (High Sensitivity) 28 (*)    All other components within normal limits  TROPONIN I (HIGH SENSITIVITY) - Abnormal; Notable for the following components:   Troponin I (High Sensitivity) 30 (*)    All other components within normal limits  RESP PANEL BY RT-PCR (FLU A&B, COVID) ARPGX2  CBC  PROTIME-INR  BASIC METABOLIC PANEL  CBC    EKG  Sinus rhythm, rate of 94 bpm.  Normal axis.  Right bundle.  No STEMI.  RADIOLOGY CXR reviewed by me without evidence of acute cardiopulmonary pathology.  Official radiology report(s): DG Chest 2 View  Result Date: 05/14/2021 CLINICAL DATA:  sob EXAM:  CHEST - 2 VIEW.  Patient is rotated on frontal view. COMPARISON:  Chest x-ray 04/21/2021, CT chest 04/21/2021 FINDINGS: Likely defibrillator vest overlying patient. The heart and mediastinal contours are grossly unchanged given patient rotation. Coronary bypass surgical changes noted overlying the mediastinum. Poorly visualized right heart border on frontal view likely due to patient rotation with no definite right middle lobe collapse on lateral view. No focal consolidation. Chronic coarsened markings with no overt pulmonary edema. No pleural effusion. No pneumothorax. No acute osseous abnormality. IMPRESSION: No active cardiopulmonary disease. Electronically Signed   By: Iven Finn M.D.   On: 05/14/2021 18:08    PROCEDURES and INTERVENTIONS:  .1-3 Lead EKG Interpretation Performed by: Vladimir Crofts, MD Authorized by: Vladimir Crofts, MD     Interpretation: normal     ECG rate:  90   ECG rate  assessment: normal     Rhythm: sinus rhythm     Ectopy: none     Conduction: normal    Medications  aspirin EC tablet 81 mg (has no administration in time range)  amiodarone (PACERONE) tablet 200 mg (has no administration in time range)  amLODipine (NORVASC) tablet 5 mg (has no administration in time range)  atorvastatin (LIPITOR) tablet 80 mg (has no administration in time range)  lisinopril (ZESTRIL) tablet 2.5 mg (has no administration in time range)  metoprolol succinate (TOPROL-XL) 24 hr tablet 25 mg (has no administration in time range)  metFORMIN (GLUCOPHAGE) tablet 1,000 mg (has no administration in time range)  pantoprazole (PROTONIX) EC tablet 40 mg (has no administration in time range)  tamsulosin (FLOMAX) capsule 0.4 mg (has no administration in time range)  apixaban (ELIQUIS) tablet 5 mg (5 mg Oral Given 05/15/21 0002)  ascorbic acid (VITAMIN C) tablet 500 mg (has no administration in time range)  zinc sulfate capsule 220 mg (has no administration in time range)  guaiFENesin-dextromethorphan (ROBITUSSIN DM) 100-10 MG/5ML syrup 10 mL (has no administration in time range)  furosemide (LASIX) injection 60 mg (has no administration in time range)  acetaminophen (TYLENOL) tablet 650 mg (has no administration in time range)    Or  acetaminophen (TYLENOL) suppository 650 mg (has no administration in time range)  traZODone (DESYREL) tablet 25 mg (has no administration in time range)  magnesium hydroxide (MILK OF MAGNESIA) suspension 30 mL (has no administration in time range)  ondansetron (ZOFRAN) tablet 4 mg (has no administration in time range)    Or  ondansetron (ZOFRAN) injection 4 mg (has no administration in time range)  furosemide (LASIX) injection 60 mg (60 mg Intravenous Given 05/14/21 2215)     IMPRESSION / MDM / [place] / ED COURSE  I reviewed the triage vital signs and the nursing notes.  Pleasant 71 year old male presents to the ED with acute on chronic  CHF requiring medical observation admission.  He has sats as low as about 90%, improved with 1 L nasal cannula, monitor vitals on room air.  He looks volume overloaded on examination with bibasilar crackles and trace pitting edema.  No distress or indications for BiPAP.  Blood work with flat troponins that are marginally elevated and EKG without STEMI.  His BNP is quite elevated and I suspect CHF causing his dyspnea.  Blood work significant Normal CBC and unremarkable BMP.  No infiltrates on chest x-ray.  Considering his poor ejection fraction, overall risk profile I offered medical observation admission to the patient and he agrees to stay.  I consulted with medicine who agrees to admit  FINAL CLINICAL IMPRESSION(S) / ED DIAGNOSES   Final diagnoses:  Acute on chronic systolic congestive heart failure (HCC)  SOB (shortness of breath)     Rx / DC Orders   ED Discharge Orders     None        Note:  This document was prepared using Dragon voice recognition software and may include unintentional dictation errors.   Vladimir Crofts, MD 05/15/21 [phone]

## 2021-05-14 NOTE — ED Triage Notes (Addendum)
Arrived by EMS from home for sob. SOB started yesterday and has worsened. EMS reported wheezing and rhonchi. EMS administered 125mg  solumedrol and 1 duoneb and wheezing subsided. RA sats in triage 90%. Patient currently wearing lifevest.

## 2021-05-14 NOTE — ED Triage Notes (Signed)
First Nurse Note:  Arrives from home via [facility] c/o SOB.  RA sats 91%.  Wheezing and rhonchi auscultated.  1 duo neb given.  Wheezing subsided, RA 93-95%.  Currently on 3l/ [place].  125 mg Solumedrol given.  135/87 18 - R 34 CO2 93 P NSR  CABG in November, [facility] In December

## 2021-05-14 NOTE — H&P (Signed)
[place]   PATIENT NAME: Jermaine Kramer    MR#:  [id number]  DATE OF BIRTH:  1950-10-30  DATE OF ADMISSION:  05/14/2021  PRIMARY CARE PHYSICIAN: Sherre Scarlet, PA-C   Patient is coming from: Home   REQUESTING/REFERRING PHYSICIAN: Vladimir Crofts, MD  CHIEF COMPLAINT:   Chief Complaint  Patient presents with   Shortness of Breath    HISTORY OF PRESENT ILLNESS:  Jermaine Kramer is a 71 y.o. Caucasian male with medical history significant for systolic CHF with dilated cardiomyopathy with EF of 35 to 40%, having LifeVest, coronary artery disease status post CABG, type  2diabetes mellitus, GERD, hypertension, COVID-19, dyslipidemia and CVA, who presented to the ER with acute onset of worsening dyspnea since yesterday as well as dry cough and wheezing.  He admitted to dyspnea on exertion and mild lower extremity edema.  He denied any significant paroxysmal nocturnal dyspnea.  No fever or chills.  No nausea or vomiting or abdominal pain.  No dysuria, oliguria or hematuria or flank pain.   ED Course: Upon presentation to the emergency room, pulse oximetry was 90% on 1 L of O2 by nasal cannula and later 94% with otherwise normal vital signs.  BMP showed a blood glucose of 185.  BNP was 1172.8 high-sensitivity troponin I  was 28 and later 30.  CBC was within normal. EKG as reviewed by me : EKG showed normal sinus rhythm with a rate of 94 with frequent PVCs and right bundle branch block with T wave inversion anteroseptally Imaging: 2 view chest x-ray showed no acute cardiopulmonary disease.  The patient was given 60 mg of IV Lasix.  He will be admitted to a cardiac telemetry bed for further evaluation and management. PAST MEDICAL HISTORY:   Past Medical History:  Diagnosis Date   Cancer ([place])    leukemia   CHF (congestive heart failure) (HCC)    Diabetes mellitus without complication (HCC)    GERD (gastroesophageal reflux disease)    Hypercholesteremia    Hypertension    Stroke  [name])   -Coronary artery disease status post CABG - Paroxysmal atrial fibrillation on Eliquis - Dilated cardiomyopathy. -COVID-19 PAST SURGICAL HISTORY:   Past Surgical History:  Procedure Laterality Date   CHOLECYSTECTOMY     COLONOSCOPY WITH PROPOFOL N/A 04/14/2019   Procedure: COLONOSCOPY WITH PROPOFOL;  Surgeon: Toledo, Benay Pike, MD;  Location: [facility] ENDOSCOPY;  Service: Gastroenterology;  Laterality: N/A;   KNEE ARTHROSCOPY     RENAL CYST EXCISION    -CABG  SOCIAL HISTORY:   Social History   Tobacco Use   Smoking status: Never   Smokeless tobacco: Never  Substance Use Topics   Alcohol use: Not Currently    FAMILY HISTORY:   Family History  Problem Relation Age of Onset   Parkinson's disease Mother    Lupus Mother    Heart disease Father     DRUG ALLERGIES:  No Known Allergies  REVIEW OF SYSTEMS:   ROS As per history of present illness. All pertinent systems were reviewed above. Constitutional, HEENT, cardiovascular, respiratory, GI, GU, musculoskeletal, neuro, psychiatric, endocrine, integumentary and hematologic systems were reviewed and are otherwise negative/unremarkable except for positive findings mentioned above in the HPI.   MEDICATIONS AT HOME:   Prior to Admission medications   Medication Sig Start Date End Date Taking? Authorizing Provider  amiodarone (PACERONE) 200 MG tablet Take 200 mg by mouth daily. 03/06/21   [provider]  amLODipine (NORVASC) 5 MG  tablet Take 5 mg by mouth daily. 03/01/21   [provider]  ascorbic acid (VITAMIN C) 500 MG tablet Take 1 tablet (500 mg total) by mouth daily. 04/24/21 05/24/21  Max Sane, MD  aspirin EC 81 MG tablet Take 81 mg by mouth daily.    [provider]  atorvastatin (LIPITOR) 80 MG tablet Take 80 mg by mouth daily. 02/10/21   [provider]  ELIQUIS 5 MG TABS tablet Take 1 tablet by mouth in the morning and at bedtime. 04/02/21   [provider]   furosemide (LASIX) 20 MG tablet Take 1 tablet by mouth daily. 01/17/21 07/30/21  [provider]  guaiFENesin-dextromethorphan (ROBITUSSIN DM) 100-10 MG/5ML syrup Take 10 mLs by mouth every 4 (four) hours as needed for cough. 04/23/21   Max Sane, MD  lisinopril (ZESTRIL) 2.5 MG tablet Take 2.5 mg by mouth daily. 03/06/21   [provider]  metFORMIN (GLUCOPHAGE) 1000 MG tablet Take 1,000 mg by mouth 2 (two) times daily with a meal.    [provider]  metoprolol succinate (TOPROL-XL) 25 MG 24 hr tablet Take 1 tablet by mouth daily. 12/10/20 12/10/21  [provider]  omeprazole (PRILOSEC) 40 MG capsule Take 40 mg by mouth daily. 03/11/21   [provider]  tamsulosin (FLOMAX) 0.4 MG CAPS capsule Take 1 tablet by mouth daily. 04/09/20   [provider]  zinc sulfate 220 (50 Zn) MG capsule Take 1 capsule (220 mg total) by mouth daily. 04/24/21 05/24/21  Max Sane, MD      VITAL SIGNS:  Blood pressure (!) 114/104, pulse 87, temperature 98.6 F (37 C), temperature source Oral, resp. rate 17, height 5\' 5"  (1.651 m), weight 87.1 kg, SpO2 94 %.  PHYSICAL EXAMINATION:  Physical Exam  GENERAL:  71 y.o.-year-old Caucasian male patient lying in the bed with no acute distress.  EYES: Pupils equal, round, reactive to light and accommodation. No scleral icterus. Extraocular muscles intact.  HEENT: Head atraumatic, normocephalic. Oropharynx and nasopharynx clear.  NECK:  Supple, no jugular venous distention. No thyroid enlargement, no tenderness.  LUNGS: Diminished bibasilar breath sounds with bibasal rales. No use of accessory muscles of respiration.  CARDIOVASCULAR: Regular rate and rhythm, S1, S2 normal. No murmurs, rubs, or gallops.  ABDOMEN: Soft, nondistended, nontender. Bowel sounds present. No organomegaly or mass.  EXTREMITIES: Trace bilateral lower extremity pitting edema with no, cyanosis, or clubbing.  NEUROLOGIC: Cranial nerves II through  XII are intact. Muscle strength 5/5 in all extremities. Sensation intact. Gait not checked.  PSYCHIATRIC: The patient is alert and oriented x 3.  Normal affect and good eye contact. SKIN: No obvious rash, lesion, or ulcer.   LABORATORY PANEL:   CBC Recent Labs  Lab 05/14/21 1705  WBC 6.0  HGB 13.1  HCT 39.9  PLT 171   ------------------------------------------------------------------------------------------------------------------  Chemistries  Recent Labs  Lab 05/14/21 1705  NA 136  K 4.1  CL 104  CO2 23  GLUCOSE 185*  BUN 14  CREATININE 0.93  CALCIUM 8.5*   ------------------------------------------------------------------------------------------------------------------  Cardiac Enzymes No results for input(s): TROPONINI in the last 168 hours. ------------------------------------------------------------------------------------------------------------------  RADIOLOGY:  DG Chest 2 View  Result Date: 05/14/2021 CLINICAL DATA:  sob EXAM: CHEST - 2 VIEW.  Patient is rotated on frontal view. COMPARISON:  Chest x-ray 04/21/2021, CT chest 04/21/2021 FINDINGS: Likely defibrillator vest overlying patient. The heart and mediastinal contours are grossly unchanged given patient rotation. Coronary bypass surgical changes noted overlying the mediastinum. Poorly visualized right  heart border on frontal view likely due to patient rotation with no definite right middle lobe collapse on lateral view. No focal consolidation. Chronic coarsened markings with no overt pulmonary edema. No pleural effusion. No pneumothorax. No acute osseous abnormality. IMPRESSION: No active cardiopulmonary disease. Electronically Signed   By: Iven Finn M.D.   On: 05/14/2021 18:08      IMPRESSION AND PLAN:  Principal Problem:   Acute CHF (congestive heart failure) ([place])  1.  Acute on chronic systolic CHF with ischemic cardiomyopathy and history of coronary artery disease status post CABG.  The patient  was initially hypoxic and is currently on room air. - The patient will be admitted to a cardiac telemetry bed. - He will be diuresed with IV Lasix. - We will follow serial troponins. - 2D echo and cardiology consult will be obtained. - I notified Dr. Corky Sox about the patient. - The patient has a LifeVest. - We will continue lisinopril and Toprol-XL. - O2 protocol will be followed.  2.  Essential hypertension. - We will continue amlodipine, Toprol-XL and lisinopril.  3.  Paroxysmal atrial fibrillation. - We will continue amiodarone, Eliquis and Toprol-XL.  4.  Dyslipidemia. - We will continue statin therapy.  5.  BPH. - We will continue Flomax.  6.  GERD. - PPI therapy will be resumed.  7.  Coronary artery disease status post CABG. - We will continue aspirin, statin therapy, lisinopril and Toprol-XL.  8.  Type 2 diabetes mellitus. - The patient will be placed on supplement coverage with NovoLog and will continue his basal coverage.  DVT prophylaxis: Eliquis.   Code Status: full code. Family Communication:  The plan of care was discussed in details with the patient (and family). I answered all questions. The patient agreed to proceed with the above mentioned plan. Further management will depend upon hospital course. Disposition Plan: Back to previous home environment Consults called: Cardiology. All the records are reviewed and case discussed with ED provider.  Status is: Inpatient   At the time of the admission, it appears that the appropriate admission status for this patient is inpatient.  This is judged to be reasonable and necessary in order to provide the required intensity of service to ensure the patient's safety given the presenting symptoms, physical exam findings and initial radiographic and laboratory data in the context of comorbid conditions.  The patient requires inpatient status due to high intensity of service, high risk of further deterioration and high  frequency of surveillance required.  I certify that at the time of admission, it is my clinical judgment that the patient will require inpatient hospital care extending more than 2 midnights.                            Dispo: The patient is from: Home              Anticipated d/c is to: Home              Patient currently is not medically stable to d/c.              Difficult to place patient: No   Christel Mormon M.D on 05/14/2021 at 10:33 PM  [facility]   From 7 PM-7 AM, contact night-coverage [email or web address]  CC: Primary care physician; Sherre Scarlet, PA-C

## 2021-05-15 DIAGNOSIS — I5021 Acute systolic (congestive) heart failure: Secondary | ICD-10-CM

## 2021-05-15 LAB — RESP PANEL BY RT-PCR (FLU A&B, COVID) ARPGX2
Influenza A by PCR: NEGATIVE
Influenza B by PCR: NEGATIVE
SARS Coronavirus 2 by RT PCR: POSITIVE — AB

## 2021-05-15 LAB — CBC
HCT: 40.4 % (ref 39.0–52.0)
Hemoglobin: 13.3 g/dL (ref 13.0–17.0)
MCH: 29.5 pg (ref 26.0–34.0)
MCHC: 32.9 g/dL (ref 30.0–36.0)
MCV: 89.6 fL (ref 80.0–100.0)
Platelets: 185 10*3/uL (ref 150–400)
RBC: 4.51 MIL/uL (ref 4.22–5.81)
RDW: 12.9 % (ref 11.5–15.5)
WBC: 4.9 10*3/uL (ref 4.0–10.5)
nRBC: 0 % (ref 0.0–0.2)

## 2021-05-15 LAB — BASIC METABOLIC PANEL
Anion gap: 11 (ref 5–15)
BUN: 22 mg/dL (ref 8–23)
CO2: 23 mmol/L (ref 22–32)
Calcium: 8.6 mg/dL — ABNORMAL LOW (ref 8.9–10.3)
Chloride: 101 mmol/L (ref 98–111)
Creatinine, Ser: 1.03 mg/dL (ref 0.61–1.24)
GFR, Estimated: >60 mL/min (ref >60)
Glucose, Bld: 302 mg/dL — ABNORMAL HIGH (ref 70–99)
Potassium: 4.2 mmol/L (ref 3.5–5.1)
Sodium: 135 mmol/L (ref 135–145)

## 2021-05-15 NOTE — ED Notes (Signed)
Pt placed on 2L nasal canula at this time. Pt oxygen saturations were 88% while asleep.

## 2021-05-15 NOTE — Progress Notes (Signed)
PROGRESS NOTE    Jermaine Kramer  VZD:[id number] DOB: 28-Jun-1950 DOA: 05/14/2021 PCP: Sherre Scarlet, PA-C    Brief Narrative:  71 y.o. Caucasian male with medical history significant for systolic CHF with dilated cardiomyopathy with EF of 35 to 40%, having LifeVest, coronary artery disease status post CABG, type  2diabetes mellitus, GERD, hypertension, COVID-19, dyslipidemia and CVA, who presented to the ER with acute onset of worsening dyspnea since yesterday as well as dry cough and wheezing.  He admitted to dyspnea on exertion and mild lower extremity edema.  He denied any significant paroxysmal nocturnal dyspnea.  No fever or chills.  No nausea or vomiting or abdominal pain.  No dysuria, oliguria or hematuria or flank pain.   Assessment & Plan:   Principal Problem:   Acute CHF (congestive heart failure) (HCC)  Acute on chronic systolic congestive heart failure Ischemic cardiomyopathy, EF 35 to 40% status post LifeVest Coronary artery disease status post CABG in October 2022 Acute hypoxic respiratory failure, now resolved Patient had a prolonged [facility] late last year Three-vessel CABG in October Presents for worsening dyspnea, elevated BNP Signs and symptoms consistent with acute decompensated heart failure Plan: IV Lasix 60 every 12 Strict ins and outs Daily weights Target net -1-1.5 L daily Daily aspirin Patient not on another antiplatelet agent GDMT with lisinopril and Toprol-XL Eliquis per home medication reconciliation Patient with LifeVest Telemetry monitoring  Paroxysmal atrial fibrillation Rate controlled Continue amiodarone and Toprol-XL Eliquis for anticoagulation Telemetry monitoring  Essential hypertension PTA amlodipine, metoprolol, lisinopril  Hyperlipidemia PTA statin  BPH Flomax  GERD PPI  Type 2 diabetes mellitus Basal bolus regimen Carb modified diet    DVT prophylaxis: Eliquis Code Status: Full Family  Communication: Wife at bedside 1/15 Disposition Plan: Status is: Inpatient  Remains inpatient appropriate because: Acute decompensated systolic heart failure in setting of coronary artery disease       Level of care: Telemetry Cardiac  Consultants:  Cardiology  Procedures:  None  Antimicrobials: None   Subjective: Patient seen and examined.  Reports improvement in respiratory status since admission.  No pain complaints  Objective: Vitals:   05/15/21 0730 05/15/21 0917 05/15/21 1100 05/15/21 1300  BP: 128/73 128/79 126/88 106/66  Pulse: (!) 118 86 85 93  Resp: (!) 22 17 18 18   Temp:      TempSrc:      SpO2: 95% 98% 96% 93%  Weight:      Height:        Intake/Output Summary (Last 24 hours) at 05/15/2021 1438 Last data filed at 05/15/2021 1200 Gross per 24 hour  Intake --  Output 3350 ml  Net -3350 ml   Filed Weights   05/14/21 1701  Weight: 87.1 kg    Examination:  General exam: No acute distress Respiratory system: Bibasilar crackles.  Normal work of breathing.  Room air Cardiovascular system: S1-S2, regular rate, irregular rhythm, no murmurs, 1+ pitting edema Gastrointestinal system: Soft, NT/ND, normal bowel sounds Central nervous system: Alert and oriented. No focal neurological deficits. Extremities: Symmetric 5 x 5 power. Skin: No rashes, lesions or ulcers Psychiatry: Judgement and insight appear normal. Mood & affect appropriate.     Data Reviewed: I have personally reviewed following labs and imaging studies  CBC: Recent Labs  Lab 05/14/21 1705 05/15/21 0638  WBC 6.0 4.9  HGB 13.1 13.3  HCT 39.9 40.4  MCV 92.4 89.6  PLT 171 295   Basic Metabolic Panel: Recent Labs  Lab 05/14/21 1705  05/15/21 0638  NA 136 135  K 4.1 4.2  CL 104 101  CO2 23 23  GLUCOSE 185* 302*  BUN 14 22  CREATININE 0.93 1.03  CALCIUM 8.5* 8.6*   GFR: Estimated Creatinine Clearance: 65.7 mL/min (by C-G formula based on SCr of 1.03 mg/dL). Liver Function  Tests: No results for input(s): AST, ALT, ALKPHOS, BILITOT, PROT, ALBUMIN in the last 168 hours. No results for input(s): LIPASE, AMYLASE in the last 168 hours. No results for input(s): AMMONIA in the last 168 hours. Coagulation Profile: Recent Labs  Lab 05/14/21 1705  INR 1.1   Cardiac Enzymes: No results for input(s): CKTOTAL, CKMB, CKMBINDEX, TROPONINI in the last 168 hours. BNP (last 3 results) No results for input(s): PROBNP in the last 8760 hours. HbA1C: No results for input(s): HGBA1C in the last 72 hours. CBG: No results for input(s): GLUCAP in the last 168 hours. Lipid Profile: No results for input(s): CHOL, HDL, LDLCALC, TRIG, CHOLHDL, LDLDIRECT in the last 72 hours. Thyroid Function Tests: No results for input(s): TSH, T4TOTAL, FREET4, T3FREE, THYROIDAB in the last 72 hours. Anemia Panel: No results for input(s): VITAMINB12, FOLATE, FERRITIN, TIBC, IRON, RETICCTPCT in the last 72 hours. Sepsis Labs: No results for input(s): PROCALCITON, LATICACIDVEN in the last 168 hours.  Recent Results (from the past 240 hour(s))  Resp Panel by RT-PCR (Flu A&B, Covid) Nasopharyngeal Swab     Status: Abnormal   Collection Time: 05/15/21 12:10 AM   Specimen: Nasopharyngeal Swab; Nasopharyngeal(NP) swabs in vial transport medium  Result Value Ref Range Status   SARS Coronavirus 2 by RT PCR POSITIVE (A) NEGATIVE Final    Comment: (NOTE) SARS-CoV-2 target nucleic acids are DETECTED.  The SARS-CoV-2 RNA is generally detectable in upper respiratory specimens during the acute phase of infection. Positive results are indicative of the presence of the identified virus, but do not rule out bacterial infection or co-infection with other pathogens not detected by the test. Clinical correlation with patient history and other diagnostic information is necessary to determine patient infection status. The expected result is Negative.  Fact Sheet for  Patients: EntrepreneurPulse.com.au  Fact Sheet for Healthcare Providers: IncredibleEmployment.be  This test is not yet approved or cleared by the [place] FDA and  has been authorized for detection and/or diagnosis of SARS-CoV-2 by FDA under an Emergency Use Authorization (EUA).  This EUA will remain in effect (meaning this test can be used) for the duration of  the COVID-19 declaration under Section 564(b)(1) of the A ct, 21 U.S.C. section 360bbb-3(b)(1), unless the authorization is terminated or revoked sooner.     Influenza A by PCR NEGATIVE NEGATIVE Final   Influenza B by PCR NEGATIVE NEGATIVE Final    Comment: (NOTE) The Xpert Xpress SARS-CoV-2/FLU/RSV plus assay is intended as an aid in the diagnosis of influenza from Nasopharyngeal swab specimens and should not be used as a sole basis for treatment. Nasal washings and aspirates are unacceptable for Xpert Xpress SARS-CoV-2/FLU/RSV testing.  Fact Sheet for Patients: EntrepreneurPulse.com.au  Fact Sheet for Healthcare Providers: IncredibleEmployment.be  This test is not yet approved or cleared by the [place] FDA and has been authorized for detection and/or diagnosis of SARS-CoV-2 by FDA under an Emergency Use Authorization (EUA). This EUA will remain in effect (meaning this test can be used) for the duration of the COVID-19 declaration under Section 564(b)(1) of the Act, 21 U.S.C. section 360bbb-3(b)(1), unless the authorization is terminated or revoked.  Performed at [facility], [address] [place], [place] [address]          Radiology Studies: DG Chest 2 View  Result Date: 05/14/2021 CLINICAL DATA:  sob EXAM: CHEST - 2 VIEW.  Patient is rotated on frontal view. COMPARISON:  Chest x-ray 04/21/2021, CT chest 04/21/2021 FINDINGS: Likely defibrillator vest overlying patient. The heart and mediastinal contours are  grossly unchanged given patient rotation. Coronary bypass surgical changes noted overlying the mediastinum. Poorly visualized right heart border on frontal view likely due to patient rotation with no definite right middle lobe collapse on lateral view. No focal consolidation. Chronic coarsened markings with no overt pulmonary edema. No pleural effusion. No pneumothorax. No acute osseous abnormality. IMPRESSION: No active cardiopulmonary disease. Electronically Signed   By: Iven Finn M.D.   On: 05/14/2021 18:08        Scheduled Meds:  amiodarone  200 mg Oral Daily   amLODipine  5 mg Oral Daily   apixaban  5 mg Oral BID   ascorbic acid  500 mg Oral Daily   aspirin EC  81 mg Oral Daily   atorvastatin  80 mg Oral Daily   furosemide  60 mg Intravenous Q12H   lisinopril  2.5 mg Oral Daily   metFORMIN  1,000 mg Oral BID WC   metoprolol succinate  25 mg Oral Daily   pantoprazole  40 mg Oral Daily   tamsulosin  0.4 mg Oral Daily   zinc sulfate  220 mg Oral Daily   Continuous Infusions:   LOS: 1 day    Time spent: 50 minutes    Sidney Ace, MD [facility]   If 7PM-7AM, please contact night-coverage  05/15/2021, 2:38 PM

## 2021-05-15 NOTE — Consult Note (Signed)
[facility]  CARDIOLOGY CONSULT NOTE  Patient ID: Jermaine Kramer MRN: [id number] DOB/AGE: Feb 26, 1951 71 y.o.  Admit date: 05/14/2021 Referring Physician Ralene Muskrat Primary Physician [name], [name], PA-C Primary Cardiologist [name] Reason for Consultation AoCHF  HPI:  Jermaine Kramer is a 71 yo M with h/o coronary artery disease (s/p recent CABG with a LIMA to LAD, SVG to ramus, and SVG to PDA on 02/16/21 with Dr. Rosalita Chessman), post-operative atrial fibrillation (currently rhythm controlled with Amiodarone and anticoagulated with Eliquis), a dilated cardiomyopathy (LVEF 30% per cardiac MRI in 01/27/21), history of frequent PVCs, type 2 diabetes, history of a TIA, history of penile cancer, and hypertension who is admitted with shortness of breath and cough, thought to be related to CHF; persistently COVID positive. .  Cardiology is consulted for assistance in management of heart failure.   He was admitted to [facility] in October following abnormal cath and underwent CABG x 3 on 02/16/21. 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 He was seen by Dr Tobe Sos on 04/01/21 and was doing ok, but was having some issues of LE edema requiring lasix. He remained on amiodarone and Eliquis.   He was admitted to [facility] on 12/22-12/24 with COVID. He states that he has been struggling with fluid accumulation since surgery, but points to his left leg where they took his veins for CABG. Since his discharge his breathing has gotten progressively worse. He has been having such severe coughing fits which has been very painful. No orthopnea, syncope, presyncope.   Review of systems complete and found to be negative unless listed above     Past Medical History:  Diagnosis Date   Cancer ([place])    leukemia   CHF (congestive heart failure) (HCC)    Diabetes mellitus without complication (HCC)     GERD (gastroesophageal reflux disease)    Hypercholesteremia    Hypertension    Stroke [facility])     Past Surgical History:  Procedure Laterality Date   CHOLECYSTECTOMY     COLONOSCOPY WITH PROPOFOL N/A 04/14/2019   Procedure: COLONOSCOPY WITH PROPOFOL;  Surgeon: Toledo, Benay Pike, MD;  Location: [facility] ENDOSCOPY;  Service: Gastroenterology;  Laterality: N/A;   KNEE ARTHROSCOPY     RENAL CYST EXCISION      (Not in a hospital admission)  Social History   Socioeconomic History   Marital status: Married    Spouse name: Not on file   Number of children: Not on file   Years of education: Not on file   Highest education level: Not on file  Occupational History   Not on file  Tobacco Use   Smoking status: Never   Smokeless tobacco: Never  Vaping Use   Vaping Use: Never used  Substance and Sexual Activity   Alcohol use: Not Currently   Drug use: No   Sexual activity: Not on file  Other Topics Concern   Not on file  Social History Narrative   Not on file   Social Determinants of Health   Financial Resource Strain: Not on file  Food Insecurity: Not on file  Transportation Needs: Not on file  Physical Activity: Not on file  Stress: Not on file  Social Connections: Not on file  Intimate Partner Violence: Not on file    Family History  Problem Relation Age of Onset   Parkinson's disease Mother    Lupus Mother  Heart disease Father       Review of systems complete and found to be negative unless listed above      PHYSICAL EXAM  General: Well developed, well nourished, in no acute distress HEENT:  Normocephalic and atramatic Neck:  No JVD at 90 degrees.  Lungs: Clear bilaterally to auscultation and percussion. Heart: HRRR . Normal S1 and S2 without gallops or murmurs. LifeVest in place Abdomen: Bowel sounds are positive, abdomen soft and non-tender  Msk:  Back normal, normal gait. Normal strength and tone for age. Extremities: No clubbing, cyanosis or edema.    Neuro: Alert and oriented X 3. Psych:  Good affect, responds appropriately  Labs:   Lab Results  Component Value Date   WBC 4.9 05/15/2021   HGB 13.3 05/15/2021   HCT 40.4 05/15/2021   MCV 89.6 05/15/2021   PLT 185 05/15/2021    Recent Labs  Lab 05/15/21 0638  NA 135  K 4.2  CL 101  CO2 23  BUN 22  CREATININE 1.03  CALCIUM 8.6*  GLUCOSE 302*   Lab Results  Component Value Date   TROPONINI < 0.02 10/19/2013   No results found for: CHOL No results found for: HDL No results found for: LDLCALC No results found for: TRIG No results found for: CHOLHDL No results found for: LDLDIRECT    Radiology: DG Neck Soft Tissue  Result Date: 04/21/2021 CLINICAL DATA:  71 year old male with wheezing. Shortness of breath. Heart surgery in October. EXAM: NECK SOFT TISSUES - 1+ VIEW COMPARISON:  CT cervical spine 10/27/2010. FINDINGS: Large chronic anterior endplate osteophyte at Y0-V3. Prominent chronic C2-C3 endplate osteophytosis also. Normal prevertebral soft tissue contour. Pharyngeal and epiglottis contours within normal limits. Bilateral cervical carotid calcified atherosclerosis. Visualized tracheal air column is within normal limits. No acute osseous abnormality identified. Negative visible upper chest. IMPRESSION: Negative; calcified carotid atherosclerosis and large chronic anterior cervical endplate osteophytes. Electronically Signed   By: Genevie Ann M.D.   On: 04/21/2021 06:00   DG Chest 2 View  Result Date: 05/14/2021 CLINICAL DATA:  sob EXAM: CHEST - 2 VIEW.  Patient is rotated on frontal view. COMPARISON:  Chest x-ray 04/21/2021, CT chest 04/21/2021 FINDINGS: Likely defibrillator vest overlying patient. The heart and mediastinal contours are grossly unchanged given patient rotation. Coronary bypass surgical changes noted overlying the mediastinum. Poorly visualized right heart border on frontal view likely due to patient rotation with no definite right middle lobe collapse on  lateral view. No focal consolidation. Chronic coarsened markings with no overt pulmonary edema. No pleural effusion. No pneumothorax. No acute osseous abnormality. IMPRESSION: No active cardiopulmonary disease. Electronically Signed   By: Iven Finn M.D.   On: 05/14/2021 18:08   DG Chest 2 View  Result Date: 04/21/2021 CLINICAL DATA:  71 year old male with wheezing. Shortness of breath. Heart surgery in October. EXAM: CHEST - 2 VIEW COMPARISON:  04/28/2017. FINDINGS: PA and lateral views of the chest. Interval CABG. Mediastinal contours remain within normal limits. Stable lung volumes. No pneumothorax, pulmonary edema, pleural effusion or confluent pulmonary opacity. Visualized tracheal air column is within normal limits. No acute osseous abnormality identified. Stable cholecystectomy clips. Paucity of bowel gas in the upper abdomen. IMPRESSION: No acute cardiopulmonary abnormality. Electronically Signed   By: Genevie Ann M.D.   On: 04/21/2021 05:58   CT CHEST WO CONTRAST  Result Date: 04/21/2021 CLINICAL DATA:  Pneumonia, complication suspected. Patient has been wheezing for a few days. Open heart surgery in October. Patient tested COVID  positive 2 days ago. EXAM: CT CHEST WITHOUT CONTRAST TECHNIQUE: Multidetector CT imaging of the chest was performed following the standard protocol without IV contrast. COMPARISON:  None. FINDINGS: Cardiovascular: The heart is mildly enlarged. Evidence of recent cardiac surgery. No significant pericardial effusion. Mediastinum/Nodes: No enlarged mediastinal or axillary lymph nodes. Thyroid gland, trachea, and esophagus demonstrate no significant findings. Lungs/Pleura: No evidence of pneumonia. Subsegmental atelectasis along the right major fissure. Bibasilar atelectasis. Small left pleural effusion. No evidence of pulmonary edema. Upper Abdomen: Status post cholecystectomy. Calcification about the right adrenal. Mild pancreatic atrophy. No acute upper abdominal process.  Musculoskeletal: Sternotomy wires are intact. Diffuse idiopathic skeletal hyperostosis. IMPRESSION: 1.  Cardiomegaly with postsurgical changes. 2. Bibasilar atelectasis without evidence of pneumonia or pulmonary edema. 3.  Small left pleural effusion. Electronically Signed   By: Keane Police D.O.   On: 04/21/2021 10:08    EKG: Sinus tachycardia. RBBB. PVCs.   ASSESSMENT AND PLAN:  Webster Kramer is a 71 yo M with h/o coronary artery disease (s/p recent CABG with a LIMA to LAD, SVG to ramus, and SVG to PDA on 02/16/21 with Dr. Rosalita Chessman), post-operative atrial fibrillation (currently rhythm controlled with Amiodarone and anticoagulated with Eliquis), a dilated cardiomyopathy (LVEF 30% per cardiac MRI in 01/27/21), history of frequent PVCs, type 2 diabetes, history of a TIA, history of penile cancer, and hypertension who is admitted with SOB and cough. Discovered to be COVID positive (initially positive 12/22) and volume overloaded.   # Acute hypoxic respiratory failure Has COVID on admission, and appears volume overloaded. - Management of COVID per primary team - Lasix 60 mg IV BID  # Acute on Chronic HFrEF (35%) BNP 1,172 (prior 627). Renal function is currently at baseline.  Appears volume overloaded, improving with diuresis. LifeVest on for low EF and PVCs.  - Home meds- lisinopril 2.5 mg, metoprolol XL 25 mg - Continue lasix 60 mg BID - Repeat echocardiogram  # CAD s/p CABG x 3 Completed on 02/16/21, LIMA to LAD, SVG to PDA, SVG to ramus.  - Continue aspirin 81 mg - Continue atorvastatin 80 mg daily - Continue metoprolol XL 25 mg  # Atrial fibrillation - Continue eliquis 5 mg BID - Continue amiodarone 200 mg daily  Signed: Andrez Grime MD 05/15/2021, 5:04 PM

## 2021-05-15 NOTE — ED Notes (Signed)
Pt placed in hospital bed with no issue. Male purewick placed on pt. No further needs expressed by pt.

## 2021-05-16 ENCOUNTER — Inpatient Hospital Stay
Admit: 2021-05-16 | Discharge: 2021-05-16 | Disposition: A | Discharge status: Home / Self Care | Payer: Medicare Other | Attending: Family Medicine | Admitting: Family Medicine

## 2021-05-16 LAB — ECHOCARDIOGRAM COMPLETE
AR max vel: 0.96 cm2
AV Area VTI: 1.05 cm2
AV Area mean vel: 0.93 cm2
AV Mean grad: 3.3 mmHg
AV Peak grad: 6 mmHg
Ao pk vel: 1.22 m/s
Area-P 1/2: 5.16 cm2
Height: 65 in
S' Lateral: 4.7 cm
Single Plane A4C EF: 28.2 %
Weight: 3072 oz

## 2021-05-16 LAB — CBC WITH DIFFERENTIAL/PLATELET
Abs Immature Granulocytes: 0.02 10*3/uL (ref 0.00–0.07)
Basophils Absolute: 0 10*3/uL (ref 0.0–0.1)
Basophils Relative: 0 %
Eosinophils Absolute: 0.1 10*3/uL (ref 0.0–0.5)
Eosinophils Relative: 1 %
HCT: 45.2 % (ref 39.0–52.0)
Hemoglobin: 15 g/dL (ref 13.0–17.0)
Immature Granulocytes: 0 %
Lymphocytes Relative: 21 %
Lymphs Abs: 2 10*3/uL (ref 0.7–4.0)
MCH: 30.1 pg (ref 26.0–34.0)
MCHC: 33.2 g/dL (ref 30.0–36.0)
MCV: 90.6 fL (ref 80.0–100.0)
Monocytes Absolute: 0.8 10*3/uL (ref 0.1–1.0)
Monocytes Relative: 9 %
Neutro Abs: 6.5 10*3/uL (ref 1.7–7.7)
Neutrophils Relative %: 69 %
Platelets: 269 10*3/uL (ref 150–400)
RBC: 4.99 MIL/uL (ref 4.22–5.81)
RDW: 13.1 % (ref 11.5–15.5)
WBC: 9.4 10*3/uL (ref 4.0–10.5)
nRBC: 0 % (ref 0.0–0.2)

## 2021-05-16 LAB — BASIC METABOLIC PANEL
Anion gap: 10 (ref 5–15)
BUN: 30 mg/dL — ABNORMAL HIGH (ref 8–23)
CO2: 27 mmol/L (ref 22–32)
Calcium: 8.5 mg/dL — ABNORMAL LOW (ref 8.9–10.3)
Chloride: 102 mmol/L (ref 98–111)
Creatinine, Ser: 1.1 mg/dL (ref 0.61–1.24)
GFR, Estimated: >60 mL/min (ref >60)
Glucose, Bld: 183 mg/dL — ABNORMAL HIGH (ref 70–99)
Potassium: 3.7 mmol/L (ref 3.5–5.1)
Sodium: 139 mmol/L (ref 135–145)

## 2021-05-16 LAB — HEMOGLOBIN A1C
Hgb A1c MFr Bld: 7.8 % — ABNORMAL HIGH (ref 4.8–5.6)
Mean Plasma Glucose: 177.16 mg/dL

## 2021-05-16 LAB — GLUCOSE, CAPILLARY: Glucose-Capillary: 264 mg/dL — ABNORMAL HIGH (ref 70–99)

## 2021-05-16 LAB — MAGNESIUM: Magnesium: 1.8 mg/dL (ref 1.7–2.4)

## 2021-05-16 MED ORDER — INSULIN ASPART 100 UNIT/ML IJ SOLN
0.0000 [IU] | Freq: Three times a day (TID) | INTRAMUSCULAR | Status: DC
  Administered 2021-05-17: 8 [IU] via SUBCUTANEOUS
  Administered 2021-05-17: 3 [IU] via SUBCUTANEOUS
  Administered 2021-05-18: 5 [IU] via SUBCUTANEOUS
  Filled 2021-05-16 (×2): qty 1

## 2021-05-16 MED ORDER — INSULIN ASPART 100 UNIT/ML IJ SOLN
0.0000 [IU] | Freq: Every day | INTRAMUSCULAR | Status: DC
  Administered 2021-05-16: 3 [IU] via SUBCUTANEOUS
  Administered 2021-05-17: 2 [IU] via SUBCUTANEOUS
  Filled 2021-05-16 (×2): qty 1

## 2021-05-16 MED ORDER — FUROSEMIDE 10 MG/ML IJ SOLN
40.0000 mg | Freq: Two times a day (BID) | INTRAMUSCULAR | Status: DC
  Administered 2021-05-16: 40 mg via INTRAVENOUS
  Filled 2021-05-16: qty 4

## 2021-05-16 MED ORDER — METOPROLOL SUCCINATE ER 25 MG PO TB24
37.5000 mg | ORAL_TABLET | Freq: Every day | ORAL | Status: DC
  Administered 2021-05-17 – 2021-05-18 (×2): 37.5 mg via ORAL
  Filled 2021-05-16 (×2): qty 2

## 2021-05-16 MED ORDER — FUROSEMIDE 10 MG/ML IJ SOLN: 60.0000 mg | Freq: Two times a day (BID) | INTRAMUSCULAR | Status: DC

## 2021-05-16 NOTE — Progress Notes (Signed)
*  PRELIMINARY RESULTS* Echocardiogram 2D Echocardiogram has been performed.  Jermaine Kramer 05/16/2021, 7:57 AM

## 2021-05-16 NOTE — Progress Notes (Signed)
PROGRESS NOTE    DONTRELLE Kramer  FTD:[id number] DOB: [birth year] DOA: 05/14/2021 PCP: Sherre Scarlet, PA-C    Brief Narrative:  71 y.o. Caucasian male with medical history significant for systolic CHF with dilated cardiomyopathy with EF of 35 to 40%, having LifeVest, coronary artery disease status post CABG, type  2diabetes mellitus, GERD, hypertension, COVID-19, dyslipidemia and CVA, who presented to the ER with acute onset of worsening dyspnea since yesterday as well as dry cough and wheezing.  He admitted to dyspnea on exertion and mild lower extremity edema.  He denied any significant paroxysmal nocturnal dyspnea.  No fever or chills.  No nausea or vomiting or abdominal pain.  No dysuria, oliguria or hematuria or flank pain.   Assessment & Plan:   Principal Problem:   Acute CHF (congestive heart failure) (HCC)  Acute on chronic systolic congestive heart failure Ischemic cardiomyopathy, EF 35 to 40% status post LifeVest Coronary artery disease status post CABG in October 2022 Acute hypoxic respiratory failure, now resolved Patient had a prolonged hospitalization at [facility] late last year Three-vessel CABG in October Presents for worsening dyspnea, elevated BNP Signs and symptoms consistent with acute decompensated heart failure Repeat TTE with severely reduced EF, less than 20% with grade 1 diastolic dysfunction Net -5/2 L since admission Plan: IV Lasix 40 mg every 12 hours Target net -1-1.5 L daily Strict ins and outs Daily weights Daily aspirin Patient not on another antiplatelet agent GDMT with lisinopril and Toprol-XL Eliquis per home medication reconciliation Continue LifeVest Telemetry monitoring  Paroxysmal atrial fibrillation Rate controlled Continue amiodarone and Toprol-XL Eliquis for anticoagulation Continue cardiac monitoring  Essential hypertension PTA amlodipine, metoprolol, lisinopril  Hyperlipidemia PTA statin  BPH Flomax  GERD PPI  Type 2  diabetes mellitus Basal bolus regimen Carb modified diet    DVT prophylaxis: Eliquis Code Status: Full Family Communication: Wife at bedside 1/15, via phone 1/16 Disposition Plan: Status is: Inpatient  Remains inpatient appropriate because: Acute decompensated systolic heart failure in setting of coronary artery disease.  Worsening ejection fraction.  Cardiology following.  Patient seen and examined.  Reports improved respiratory status since admission.  No pain complaints       Level of care: Telemetry Cardiac  Consultants:  Cardiology  Procedures:  None  Antimicrobials: None   Subjective: Patient seen and examined.  Reports improvement in respiratory status since admission.  No pain complaints  Objective: Vitals:   05/16/21 1100 05/16/21 1130 05/16/21 1200 05/16/21 1230  BP: 106/70 103/61 104/65 121/70  Pulse: 89 81 79 79  Resp:  17  18  Temp:      TempSrc:      SpO2: 94% 95% 98% 95%  Weight:      Height:        Intake/Output Summary (Last 24 hours) at 05/16/2021 1424 Last data filed at 05/16/2021 1100 Gross per 24 hour  Intake --  Output 1825 ml  Net -1825 ml   Filed Weights   05/14/21 1701  Weight: 87.1 kg    Examination:  General exam: No acute distress Respiratory system: Bibasilar crackles.  Normal work of breathing.  Room air Cardiovascular system: S1-S2, regular rate, irregular rhythm, no murmurs, trace pitting edema bilateral Gastrointestinal system: Soft, NT/ND, normal bowel sounds Central nervous system: Alert and oriented. No focal neurological deficits. Extremities: Symmetric 5 x 5 power. Skin: No rashes, lesions or ulcers Psychiatry: Judgement and insight appear normal. Mood & affect appropriate.     Data Reviewed: I have personally reviewed  following labs and imaging studies  CBC: Recent Labs  Lab 05/14/21 1705 05/15/21 0638 05/16/21 0835  WBC 6.0 4.9 9.4  NEUTROABS  --   --  6.5  HGB 13.1 13.3 15.0  HCT 39.9 40.4 45.2   MCV 92.4 89.6 90.6  PLT 171 185 829   Basic Metabolic Panel: Recent Labs  Lab 05/14/21 1705 05/15/21 0638 05/16/21 0835  NA 136 135 139  K 4.1 4.2 3.7  CL 104 101 102  CO2 23 23 27   GLUCOSE 185* 302* 183*  BUN 14 22 30*  CREATININE 0.93 1.03 1.10  CALCIUM 8.5* 8.6* 8.5*  MG  --   --  1.8   GFR: Estimated Creatinine Clearance: 61.6 mL/min (by C-G formula based on SCr of 1.1 mg/dL). Liver Function Tests: No results for input(s): AST, ALT, ALKPHOS, BILITOT, PROT, ALBUMIN in the last 168 hours. No results for input(s): LIPASE, AMYLASE in the last 168 hours. No results for input(s): AMMONIA in the last 168 hours. Coagulation Profile: Recent Labs  Lab 05/14/21 1705  INR 1.1   Cardiac Enzymes: No results for input(s): CKTOTAL, CKMB, CKMBINDEX, TROPONINI in the last 168 hours. BNP (last 3 results) No results for input(s): PROBNP in the last 8760 hours. HbA1C: No results for input(s): HGBA1C in the last 72 hours. CBG: No results for input(s): GLUCAP in the last 168 hours. Lipid Profile: No results for input(s): CHOL, HDL, LDLCALC, TRIG, CHOLHDL, LDLDIRECT in the last 72 hours. Thyroid Function Tests: No results for input(s): TSH, T4TOTAL, FREET4, T3FREE, THYROIDAB in the last 72 hours. Anemia Panel: No results for input(s): VITAMINB12, FOLATE, FERRITIN, TIBC, IRON, RETICCTPCT in the last 72 hours. Sepsis Labs: No results for input(s): PROCALCITON, LATICACIDVEN in the last 168 hours.  Recent Results (from the past 240 hour(s))  Resp Panel by RT-PCR (Flu A&B, Covid) Nasopharyngeal Swab     Status: Abnormal   Collection Time: 05/15/21 12:10 AM   Specimen: Nasopharyngeal Swab; Nasopharyngeal(NP) swabs in vial transport medium  Result Value Ref Range Status   SARS Coronavirus 2 by RT PCR POSITIVE (A) NEGATIVE Final    Comment: (NOTE) SARS-CoV-2 target nucleic acids are DETECTED.  The SARS-CoV-2 RNA is generally detectable in upper respiratory specimens during the acute  phase of infection. Positive results are indicative of the presence of the identified virus, but do not rule out bacterial infection or co-infection with other pathogens not detected by the test. Clinical correlation with patient history and other diagnostic information is necessary to determine patient infection status. The expected result is Negative.  Fact Sheet for Patients: EntrepreneurPulse.com.au  Fact Sheet for Healthcare Providers: IncredibleEmployment.be  This test is not yet approved or cleared by the [place] FDA and  has been authorized for detection and/or diagnosis of SARS-CoV-2 by FDA under an Emergency Use Authorization (EUA).  This EUA will remain in effect (meaning this test can be used) for the duration of  the COVID-19 declaration under Section 564(b)(1) of the A ct, 21 U.S.C. section 360bbb-3(b)(1), unless the authorization is terminated or revoked sooner.     Influenza A by PCR NEGATIVE NEGATIVE Final   Influenza B by PCR NEGATIVE NEGATIVE Final    Comment: (NOTE) The Xpert Xpress SARS-CoV-2/FLU/RSV plus assay is intended as an aid in the diagnosis of influenza from Nasopharyngeal swab specimens and should not be used as a sole basis for treatment. Nasal washings and aspirates are unacceptable for Xpert Xpress SARS-CoV-2/FLU/RSV testing.  Fact Sheet for Patients: EntrepreneurPulse.com.au  Fact Sheet  for Healthcare Providers: IncredibleEmployment.be  This test is not yet approved or cleared by the [place] and has been authorized for detection and/or diagnosis of SARS-CoV-2 by FDA under an Emergency Use Authorization (EUA). This EUA will remain in effect (meaning this test can be used) for the duration of the COVID-19 declaration under Section 564(b)(1) of the Act, 21 U.S.C. section 360bbb-3(b)(1), unless the authorization is terminated or revoked.  Performed at  [facility], [address] [place], [place] [address]          Radiology Studies: DG Chest 2 View  Result Date: 05/14/2021 CLINICAL DATA:  sob EXAM: CHEST - 2 VIEW.  Patient is rotated on frontal view. COMPARISON:  Chest x-ray 04/21/2021, CT chest 04/21/2021 FINDINGS: Likely defibrillator vest overlying patient. The heart and mediastinal contours are grossly unchanged given patient rotation. Coronary bypass surgical changes noted overlying the mediastinum. Poorly visualized right heart border on frontal view likely due to patient rotation with no definite right middle lobe collapse on lateral view. No focal consolidation. Chronic coarsened markings with no overt pulmonary edema. No pleural effusion. No pneumothorax. No acute osseous abnormality. IMPRESSION: No active cardiopulmonary disease. Electronically Signed   By: Iven Finn M.D.   On: 05/14/2021 18:08   ECHOCARDIOGRAM COMPLETE  Result Date: 05/16/2021    ECHOCARDIOGRAM REPORT   Patient Name:   Jermaine Kramer Date of Exam: 05/16/2021 Medical Rec #:  [id number]     Height:       65.0 in Accession #:    [id number]    Weight:       192.0 lb Date of Birth:  15-Nov-1950      BSA:          1.944 m Patient Age:    60 years      BP:           110/86 mmHg Patient Gender: M             HR:           70 bpm. Exam Location:  [facility] Procedure: 2D Echo, Cardiac Doppler and Color Doppler Indications:     CHF-acute systolic Q67.61  History:         Patient has no prior history of Echocardiogram examinations.                  CHF; Risk Factors:Diabetes and Hypertension.  Sonographer:     Sherrie Sport Referring Phys:  [id number] JAN A [place] Diagnosing Phys: Serafina Royals MD  Sonographer Comments: Suboptimal apical window. IMPRESSIONS  1. Left ventricular ejection fraction, by estimation, is <20%. The left ventricle has severely decreased function. The left ventricle demonstrates global hypokinesis. The left ventricular internal cavity size was moderately  to severely dilated. Left ventricular diastolic parameters are consistent with Grade I diastolic dysfunction (impaired relaxation).  2. Right ventricular systolic function is normal. The right ventricular size is normal.  3. Left atrial size was mild to moderately dilated.  4. The mitral valve is normal in structure. Mild to moderate mitral valve regurgitation.  5. The aortic valve is normal in structure. Aortic valve regurgitation is trivial. FINDINGS  Left Ventricle: Left ventricular ejection fraction, by estimation, is <20%. The left ventricle has severely decreased function. The left ventricle demonstrates global hypokinesis. The left ventricular internal cavity size was moderately to severely dilated. There is borderline left ventricular hypertrophy. Left ventricular diastolic parameters are consistent with Grade I diastolic dysfunction (impaired relaxation). Right Ventricle: The right  ventricular size is normal. No increase in right ventricular wall thickness. Right ventricular systolic function is normal. Left Atrium: Left atrial size was mild to moderately dilated. Right Atrium: Right atrial size was normal in size. Pericardium: There is no evidence of pericardial effusion. Mitral Valve: The mitral valve is normal in structure. Mild to moderate mitral valve regurgitation. Tricuspid Valve: The tricuspid valve is normal in structure. Tricuspid valve regurgitation is mild. Aortic Valve: The aortic valve is normal in structure. Aortic valve regurgitation is trivial. Aortic valve mean gradient measures 3.3 mmHg. Aortic valve peak gradient measures 6.0 mmHg. Aortic valve area, by VTI measures 1.05 cm. Pulmonic Valve: The pulmonic valve was normal in structure. Pulmonic valve regurgitation is not visualized. Aorta: The aortic root and ascending aorta are structurally normal, with no evidence of dilitation. IAS/Shunts: No atrial level shunt detected by color flow Doppler.  LEFT VENTRICLE PLAX 2D LVIDd:         5.60  cm      Diastology LVIDs:         4.70 cm      LV e' medial:    3.15 cm/s LV PW:         1.20 cm      LV E/e' medial:  26.1 LV IVS:        0.90 cm      LV e' lateral:   7.40 cm/s LVOT diam:     2.10 cm      LV E/e' lateral: 11.1 LV SV:         23 LV SV Index:   12 LVOT Area:     3.46 cm  LV Volumes (MOD) LV vol d, MOD A4C: 177.0 ml LV vol s, MOD A4C: 127.0 ml LV SV MOD A4C:     177.0 ml RIGHT VENTRICLE RV S prime:     8.81 cm/s TAPSE (M-mode): 1.5 cm LEFT ATRIUM           Index LA diam:      4.90 cm 2.52 cm/m LA Vol (A4C): 79.9 ml 41.10 ml/m  AORTIC VALVE                    PULMONIC VALVE AV Area (Vmax):    0.96 cm     PV Vmax:        0.61 m/s AV Area (Vmean):   0.93 cm     PV Vmean:       39.500 cm/s AV Area (VTI):     1.05 cm     PV VTI:         0.104 m AV Vmax:           122.00 cm/s  PV Peak grad:   1.5 mmHg AV Vmean:          87.233 cm/s  PV Mean grad:   1.0 mmHg AV VTI:            0.220 m      RVOT Peak grad: 3 mmHg AV Peak Grad:      6.0 mmHg AV Mean Grad:      3.3 mmHg LVOT Vmax:         33.90 cm/s LVOT Vmean:        23.300 cm/s LVOT VTI:          0.067 m LVOT/AV VTI ratio: 0.30  AORTA Ao Root diam: 3.20 cm MITRAL VALVE  TRICUSPID VALVE MV Area (PHT): 5.16 cm    TR Peak grad:   15.5 mmHg MV Decel Time: 147 msec    TR Vmax:        197.00 cm/s MV E velocity: 82.30 cm/s MV A velocity: 50.10 cm/s  SHUNTS MV E/A ratio:  1.64        Systemic VTI:  0.07 m                            Systemic Diam: 2.10 cm                            Pulmonic VTI:  0.133 m Serafina Royals MD Electronically signed by Serafina Royals MD Signature Date/Time: 05/16/2021/12:20:15 PM    Final         Scheduled Meds:  amiodarone  200 mg Oral Daily   amLODipine  5 mg Oral Daily   apixaban  5 mg Oral BID   ascorbic acid  500 mg Oral Daily   aspirin EC  81 mg Oral Daily   atorvastatin  80 mg Oral Daily   furosemide  60 mg Intravenous Q12H   lisinopril  2.5 mg Oral Daily   metFORMIN  1,000 mg Oral BID WC    metoprolol succinate  25 mg Oral Daily   pantoprazole  40 mg Oral Daily   tamsulosin  0.4 mg Oral Daily   zinc sulfate  220 mg Oral Daily   Continuous Infusions:   LOS: 2 days    Time spent: 35 minutes    Sidney Ace, MD [facility]   If 7PM-7AM, please contact night-coverage  05/16/2021, 2:24 PM

## 2021-05-16 NOTE — Progress Notes (Signed)
[facility] Cardiology  CARDIOLOGY CONSULT NOTE  Patient ID: Jermaine Kramer MRN: [id number] DOB/AGE: [birth year] 71 y.o.  Admit date: 05/14/2021 Referring Physician Ralene Muskrat Primary Physician [name], [name], PA-C Primary Cardiologist [name] Reason for Consultation AoCHF  HPI:  Jermaine Kramer is a 71 yo M with h/o coronary artery disease (s/p recent CABG with a LIMA to LAD, SVG to ramus, and SVG to PDA on 02/16/21 with Dr. Rosalita Chessman), post-operative atrial fibrillation (currently rhythm controlled with Amiodarone and anticoagulated with Eliquis), a dilated cardiomyopathy (LVEF 30% per cardiac MRI in 01/27/21), history of frequent PVCs, type 2 diabetes, history of a TIA, history of penile cancer, and hypertension who is admitted with shortness of breath and cough, thought to be related to CHF; persistently COVID positive. Cardiology is consulted for assistance in management of heart failure.   Interval History: -great UOP overnight with IV lasix 40mg  -echo today revealed LVEF <20% with global hypokinesis. LV internal cavity with moderate to severe dilation.  -denies chest pain, feels like his breathing is much better and LE swelling improving   Review of systems complete and found to be negative unless listed above   Past Medical History:  Diagnosis Date   Cancer ([place])    leukemia   CHF (congestive heart failure) (HCC)    Diabetes mellitus without complication ([place])    GERD (gastroesophageal reflux disease)    Hypercholesteremia    Hypertension    Stroke [facility])     Past Surgical History:  Procedure Laterality Date   CHOLECYSTECTOMY     COLONOSCOPY WITH PROPOFOL N/A 04/14/2019   Procedure: COLONOSCOPY WITH PROPOFOL;  Surgeon: Toledo, Benay Pike, MD;  Location: [facility] ENDOSCOPY;  Service: Gastroenterology;  Laterality: N/A;   KNEE ARTHROSCOPY     RENAL CYST EXCISION      (Not in a hospital admission)  Social History   Socioeconomic History   Marital status: Married     Spouse name: Not on file   Number of children: Not on file   Years of education: Not on file   Highest education level: Not on file  Occupational History   Not on file  Tobacco Use   Smoking status: Never   Smokeless tobacco: Never  Vaping Use   Vaping Use: Never used  Substance and Sexual Activity   Alcohol use: Not Currently   Drug use: No   Sexual activity: Not on file  Other Topics Concern   Not on file  Social History Narrative   Not on file   Social Determinants of Health   Financial Resource Strain: Not on file  Food Insecurity: Not on file  Transportation Needs: Not on file  Physical Activity: Not on file  Stress: Not on file  Social Connections: Not on file  Intimate Partner Violence: Not on file    Family History  Problem Relation Age of Onset   Parkinson's disease Mother    Lupus Mother    Heart disease Father       Review of systems complete and found to be negative unless listed above      PHYSICAL EXAM  General: Pleasant elderly caucasian male. Well developed, well nourished, in no acute distress.  Examined sitting upright without supplemental O2 on, wife at bedside HEENT:  Normocephalic and atramatic Neck:  No JVD at 90 degrees.  Lungs: Clear bilaterally to auscultation and percussion. Heart: HRRR . Normal S1 and S2 without gallops or murmurs. LifeVest in place Abdomen: Nondistended appearing Msk: Normal strength and tone  for age. Extremities: No clubbing, cyanosis or edema.   Neuro: Alert and oriented X 3. Psych:  Good affect, responds appropriately  Labs:   Lab Results  Component Value Date   WBC 9.4 05/16/2021   HGB 15.0 05/16/2021   HCT 45.2 05/16/2021   MCV 90.6 05/16/2021   PLT 269 05/16/2021    Recent Labs  Lab 05/16/21 0835  NA 139  K 3.7  CL 102  CO2 27  BUN 30*  CREATININE 1.10  CALCIUM 8.5*  GLUCOSE 183*    Lab Results  Component Value Date   TROPONINI < 0.02 10/19/2013    No results found for: CHOL No  results found for: HDL No results found for: LDLCALC No results found for: TRIG No results found for: CHOLHDL No results found for: LDLDIRECT    Radiology: DG Neck Soft Tissue  Result Date: 04/21/2021 CLINICAL DATA:  71 year old male with wheezing. Shortness of breath. Heart surgery in October. EXAM: NECK SOFT TISSUES - 1+ VIEW COMPARISON:  CT cervical spine 10/27/2010. FINDINGS: Large chronic anterior endplate osteophyte at T0-Z6. Prominent chronic C2-C3 endplate osteophytosis also. Normal prevertebral soft tissue contour. Pharyngeal and epiglottis contours within normal limits. Bilateral cervical carotid calcified atherosclerosis. Visualized tracheal air column is within normal limits. No acute osseous abnormality identified. Negative visible upper chest. IMPRESSION: Negative; calcified carotid atherosclerosis and large chronic anterior cervical endplate osteophytes. Electronically Signed   By: Genevie Ann M.D.   On: 04/21/2021 06:00   DG Chest 2 View  Result Date: 05/14/2021 CLINICAL DATA:  sob EXAM: CHEST - 2 VIEW.  Patient is rotated on frontal view. COMPARISON:  Chest x-ray 04/21/2021, CT chest 04/21/2021 FINDINGS: Likely defibrillator vest overlying patient. The heart and mediastinal contours are grossly unchanged given patient rotation. Coronary bypass surgical changes noted overlying the mediastinum. Poorly visualized right heart border on frontal view likely due to patient rotation with no definite right middle lobe collapse on lateral view. No focal consolidation. Chronic coarsened markings with no overt pulmonary edema. No pleural effusion. No pneumothorax. No acute osseous abnormality. IMPRESSION: No active cardiopulmonary disease. Electronically Signed   By: Iven Finn M.D.   On: 05/14/2021 18:08   DG Chest 2 View  Result Date: 04/21/2021 CLINICAL DATA:  71 year old male with wheezing. Shortness of breath. Heart surgery in October. EXAM: CHEST - 2 VIEW COMPARISON:  04/28/2017.  FINDINGS: PA and lateral views of the chest. Interval CABG. Mediastinal contours remain within normal limits. Stable lung volumes. No pneumothorax, pulmonary edema, pleural effusion or confluent pulmonary opacity. Visualized tracheal air column is within normal limits. No acute osseous abnormality identified. Stable cholecystectomy clips. Paucity of bowel gas in the upper abdomen. IMPRESSION: No acute cardiopulmonary abnormality. Electronically Signed   By: Genevie Ann M.D.   On: 04/21/2021 05:58   CT CHEST WO CONTRAST  Result Date: 04/21/2021 CLINICAL DATA:  Pneumonia, complication suspected. Patient has been wheezing for a few days. Open heart surgery in October. Patient tested COVID positive 2 days ago. EXAM: CT CHEST WITHOUT CONTRAST TECHNIQUE: Multidetector CT imaging of the chest was performed following the standard protocol without IV contrast. COMPARISON:  None. FINDINGS: Cardiovascular: The heart is mildly enlarged. Evidence of recent cardiac surgery. No significant pericardial effusion. Mediastinum/Nodes: No enlarged mediastinal or axillary lymph nodes. Thyroid gland, trachea, and esophagus demonstrate no significant findings. Lungs/Pleura: No evidence of pneumonia. Subsegmental atelectasis along the right major fissure. Bibasilar atelectasis. Small left pleural effusion. No evidence of pulmonary edema. Upper Abdomen: Status post cholecystectomy. Calcification  about the right adrenal. Mild pancreatic atrophy. No acute upper abdominal process. Musculoskeletal: Sternotomy wires are intact. Diffuse idiopathic skeletal hyperostosis. IMPRESSION: 1.  Cardiomegaly with postsurgical changes. 2. Bibasilar atelectasis without evidence of pneumonia or pulmonary edema. 3.  Small left pleural effusion. Electronically Signed   By: Keane Police D.O.   On: 04/21/2021 10:08    EKG: Sinus tachycardia. RBBB. PVCs.   ASSESSMENT AND PLAN:  Jermaine Kramer is a 71 yo M with h/o coronary artery disease (s/p recent CABG with a  LIMA to LAD, SVG to ramus, and SVG to PDA on 02/16/21 with Dr. Rosalita Chessman), post-operative atrial fibrillation (currently rhythm controlled with Amiodarone and anticoagulated with Eliquis), a dilated cardiomyopathy (LVEF 30% per cardiac MRI in 01/27/21), history of frequent PVCs, type 2 diabetes, history of a TIA, history of penile cancer, and hypertension who is admitted with SOB and cough. Discovered to be COVID positive (initially positive 12/22) and volume overloaded.   # Acute hypoxic respiratory failure Has COVID and was volume overload on admission.  - Management of COVID per primary team - Lasix 60 mg IV BID  # Acute on Chronic HFrEF (LVEF <20% 05/16/21, previously 35%) BNP 1,172 (prior 627). Renal function is currently at baseline.  Appeared volume overloaded on admission, clinically improving with diuresis.  Continue LifeVest for low EF and PVCs until meeting with EP for secondary prevention. -The patient previously had an appointment with EP Dr. Nancy Fetter prior to admission, but his complicated prior hospital course did not allow him to complete that visit. The patient and his wife have no preference on electrophysiologist to meet with, preferably one that has availability the soonest.  I will work on this referral. -Continue Home meds- lisinopril 2.5 mg, metoprolol XL 25 mg - Continue lasix 60 mg BID today, output 2.5L overnight.  We will reassess further diuresis tomorrow. -Echocardiogram with LVEF less than 20, results as above  # CAD s/p CABG x 3 Completed on 02/16/21, LIMA to LAD, SVG to PDA, SVG to ramus.  - remains with LifeVest. Please continue and will reassess once we get repeat echo result - Continue aspirin 81 mg - Continue atorvastatin 80 mg daily - Continue metoprolol XL 25 mg  # Atrial fibrillation - Continue eliquis 5 mg BID - Continue amiodarone 200 mg daily  This patient's case was discussed with Dr. Serafina Royals and he is in agreement with the plan above.  Signed: Tristan Schroeder, PA-C 05/16/2021, 11:47 AM

## 2021-05-17 LAB — GLUCOSE, CAPILLARY
Glucose-Capillary: 192 mg/dL — ABNORMAL HIGH (ref 70–99)
Glucose-Capillary: 300 mg/dL — ABNORMAL HIGH (ref 70–99)
Glucose-Capillary: 327 mg/dL — ABNORMAL HIGH (ref 70–99)
Glucose-Capillary: 118 mg/dL — ABNORMAL HIGH (ref 70–99)
Glucose-Capillary: 254 mg/dL — ABNORMAL HIGH (ref 70–99)

## 2021-05-17 LAB — BASIC METABOLIC PANEL
Anion gap: 9 (ref 5–15)
BUN: 30 mg/dL — ABNORMAL HIGH (ref 8–23)
CO2: 27 mmol/L (ref 22–32)
Calcium: 8.6 mg/dL — ABNORMAL LOW (ref 8.9–10.3)
Chloride: 98 mmol/L (ref 98–111)
Creatinine, Ser: 1.32 mg/dL — ABNORMAL HIGH (ref 0.61–1.24)
GFR, Estimated: 57 mL/min — ABNORMAL LOW (ref >60)
Glucose, Bld: 319 mg/dL — ABNORMAL HIGH (ref 70–99)
Potassium: 3.9 mmol/L (ref 3.5–5.1)
Sodium: 134 mmol/L — ABNORMAL LOW (ref 135–145)

## 2021-05-17 LAB — MAGNESIUM: Magnesium: 1.8 mg/dL (ref 1.7–2.4)

## 2021-05-17 MED ORDER — MAGNESIUM SULFATE 2 GM/50ML IV SOLN
2.0000 g | Freq: Once | INTRAVENOUS | Status: AC
  Administered 2021-05-17: 2 g via INTRAVENOUS
  Filled 2021-05-17: qty 50

## 2021-05-17 MED ORDER — POTASSIUM CHLORIDE CRYS ER 20 MEQ PO TBCR
40.0000 meq | EXTENDED_RELEASE_TABLET | Freq: Once | ORAL | Status: AC
  Administered 2021-05-17: 40 meq via ORAL
  Filled 2021-05-17: qty 2

## 2021-05-17 MED ORDER — FUROSEMIDE 10 MG/ML IJ SOLN: 40.0000 mg | Freq: Two times a day (BID) | INTRAMUSCULAR | Status: DC

## 2021-05-17 MED ORDER — FUROSEMIDE 40 MG PO TABS
40.0000 mg | ORAL_TABLET | Freq: Every day | ORAL | Status: DC
  Administered 2021-05-18: 40 mg via ORAL
  Filled 2021-05-17: qty 1

## 2021-05-17 MED ORDER — FUROSEMIDE 10 MG/ML IJ SOLN
40.0000 mg | Freq: Two times a day (BID) | INTRAMUSCULAR | Status: DC
  Administered 2021-05-17: 40 mg via INTRAVENOUS
  Filled 2021-05-17: qty 4

## 2021-05-17 NOTE — Progress Notes (Addendum)
[facility] Cardiology  CARDIOLOGY CONSULT NOTE  Patient ID: Jermaine Kramer MRN: [id number] DOB/AGE: 73-Apr-1952 73 y.o.  Admit date: 05/14/2021 Referring Physician Ralene Muskrat Primary Physician [name], [name], PA-C Primary Cardiologist [name] Reason for Consultation AoCHF  HPI:  Jermaine Kramer is a 71 yo M with h/o coronary artery disease (s/p recent CABG with a LIMA to LAD, SVG to ramus, and SVG to PDA on 02/16/21 with Dr. Rosalita Chessman), post-operative atrial fibrillation (currently rhythm controlled with Amiodarone and anticoagulated with Eliquis), a dilated cardiomyopathy (LVEF 30% per cardiac MRI in 01/27/21), history of frequent PVCs, type 2 diabetes, history of a TIA, history of penile cancer, and hypertension who is admitted with shortness of breath and cough, thought to be related to CHF; persistently COVID positive. Cardiology is consulted for assistance in management of heart failure.   Interval History: - 1.9L recorded output overnight with with IV lasix 40mg  -increased metoprolol today slightly to help with pt's PVCs, although not given morning meds d/t soft BP (systolic 867).  -denies chest pain, denies worsening SOB, palpitations.    Review of systems complete and found to be negative unless listed above   Past Medical History:  Diagnosis Date   Cancer ([place])    leukemia   CHF (congestive heart failure) (HCC)    Diabetes mellitus without complication (HCC)    GERD (gastroesophageal reflux disease)    Hypercholesteremia    Hypertension    Stroke [facility])     Past Surgical History:  Procedure Laterality Date   CHOLECYSTECTOMY     COLONOSCOPY WITH PROPOFOL N/A 04/14/2019   Procedure: COLONOSCOPY WITH PROPOFOL;  Surgeon: Toledo, Benay Pike, MD;  Location: [facility] ENDOSCOPY;  Service: Gastroenterology;  Laterality: N/A;   KNEE ARTHROSCOPY     RENAL CYST EXCISION      Medications Prior to Admission  Medication Sig Dispense Refill Last Dose   amiodarone (PACERONE) 200 MG  tablet Take 200 mg by mouth daily.   05/14/2021 at Unknown   apixaban (ELIQUIS) 5 MG TABS tablet Take 5 mg by mouth 2 (two) times daily.   05/14/2021 at 2000   ascorbic acid (VITAMIN C) 500 MG tablet Take 1 tablet (500 mg total) by mouth daily. 30 tablet 0    aspirin EC 81 MG tablet Take 81 mg by mouth daily.      atorvastatin (LIPITOR) 80 MG tablet Take 80 mg by mouth daily.   05/14/2021 at Unknown   furosemide (LASIX) 20 MG tablet Take 20 mg by mouth daily.   05/14/2021 at 0800   guaiFENesin-dextromethorphan (ROBITUSSIN DM) 100-10 MG/5ML syrup Take 10 mLs by mouth every 4 (four) hours as needed for cough. 118 mL 0 Unknown at PRN   lisinopril (ZESTRIL) 2.5 MG tablet Take 2.5 mg by mouth daily.   05/14/2021 at Unknown   metFORMIN (GLUCOPHAGE) 1000 MG tablet Take 1,000 mg by mouth 2 (two) times daily with a meal.   05/14/2021 at 1700   metoprolol succinate (TOPROL-XL) 25 MG 24 hr tablet Take 25 mg by mouth daily.   05/14/2021 at Unknown   omeprazole (PRILOSEC) 40 MG capsule Take 40 mg by mouth daily.   05/14/2021 at 0700   tamsulosin (FLOMAX) 0.4 MG CAPS capsule Take 0.4 mg by mouth daily.   05/14/2021 at Unknown   zinc sulfate 220 (50 Zn) MG capsule Take 1 capsule (220 mg total) by mouth daily. 30 capsule 0     Social History   Socioeconomic History   Marital status: Married  Spouse name: Not on file   Number of children: Not on file   Years of education: Not on file   Highest education level: Not on file  Occupational History   Not on file  Tobacco Use   Smoking status: Never   Smokeless tobacco: Never  Vaping Use   Vaping Use: Never used  Substance and Sexual Activity   Alcohol use: Not Currently   Drug use: No   Sexual activity: Not on file  Other Topics Concern   Not on file  Social History Narrative   Not on file   Social Determinants of Health   Financial Resource Strain: Not on file  Food Insecurity: Not on file  Transportation Needs: Not on file  Physical Activity: Not on  file  Stress: Not on file  Social Connections: Not on file  Intimate Partner Violence: Not on file    Family History  Problem Relation Age of Onset   Parkinson's disease Mother    Lupus Mother    Heart disease Father       Review of systems complete and found to be negative unless listed above      PHYSICAL EXAM  General: Pleasant elderly caucasian male. Well developed, well nourished, in no acute distress.  Examined sitting upright, wife at bedside HEENT:  Normocephalic and atramatic Neck:  No JVD at 90 degrees.  Lungs: Clear bilaterally to auscultation. Heart: HRRR . Normal S1 and S2 without gallops or murmurs. LifeVest in place Abdomen: Nondistended appearing Msk: Normal strength and tone for age. Extremities: No clubbing, cyanosis or edema.   Neuro: Alert and oriented X 3. Psych:  Good affect, responds appropriately  Labs:   Lab Results  Component Value Date   WBC 9.4 05/16/2021   HGB 15.0 05/16/2021   HCT 45.2 05/16/2021   MCV 90.6 05/16/2021   PLT 269 05/16/2021    Recent Labs  Lab 05/17/21 0900  NA 134*  K 3.9  CL 98  CO2 27  BUN 30*  CREATININE 1.32*  CALCIUM 8.6*  GLUCOSE 319*    Lab Results  Component Value Date   TROPONINI < 0.02 10/19/2013    No results found for: CHOL No results found for: HDL No results found for: LDLCALC No results found for: TRIG No results found for: CHOLHDL No results found for: LDLDIRECT    Radiology: DG Neck Soft Tissue  Result Date: 04/21/2021 CLINICAL DATA:  71 year old male with wheezing. Shortness of breath. Heart surgery in October. EXAM: NECK SOFT TISSUES - 1+ VIEW COMPARISON:  CT cervical spine 10/27/2010. FINDINGS: Large chronic anterior endplate osteophyte at Z3-A0. Prominent chronic C2-C3 endplate osteophytosis also. Normal prevertebral soft tissue contour. Pharyngeal and epiglottis contours within normal limits. Bilateral cervical carotid calcified atherosclerosis. Visualized tracheal air column is  within normal limits. No acute osseous abnormality identified. Negative visible upper chest. IMPRESSION: Negative; calcified carotid atherosclerosis and large chronic anterior cervical endplate osteophytes. Electronically Signed   By: Genevie Ann M.D.   On: 04/21/2021 06:00   DG Chest 2 View  Result Date: 05/14/2021 CLINICAL DATA:  sob EXAM: CHEST - 2 VIEW.  Patient is rotated on frontal view. COMPARISON:  Chest x-ray 04/21/2021, CT chest 04/21/2021 FINDINGS: Likely defibrillator vest overlying patient. The heart and mediastinal contours are grossly unchanged given patient rotation. Coronary bypass surgical changes noted overlying the mediastinum. Poorly visualized right heart border on frontal view likely due to patient rotation with no definite right middle lobe collapse on lateral view. No focal  consolidation. Chronic coarsened markings with no overt pulmonary edema. No pleural effusion. No pneumothorax. No acute osseous abnormality. IMPRESSION: No active cardiopulmonary disease. Electronically Signed   By: Iven Finn M.D.   On: 05/14/2021 18:08   DG Chest 2 View  Result Date: 04/21/2021 CLINICAL DATA:  71 year old male with wheezing. Shortness of breath. Heart surgery in October. EXAM: CHEST - 2 VIEW COMPARISON:  04/28/2017. FINDINGS: PA and lateral views of the chest. Interval CABG. Mediastinal contours remain within normal limits. Stable lung volumes. No pneumothorax, pulmonary edema, pleural effusion or confluent pulmonary opacity. Visualized tracheal air column is within normal limits. No acute osseous abnormality identified. Stable cholecystectomy clips. Paucity of bowel gas in the upper abdomen. IMPRESSION: No acute cardiopulmonary abnormality. Electronically Signed   By: Genevie Ann M.D.   On: 04/21/2021 05:58   CT CHEST WO CONTRAST  Result Date: 04/21/2021 CLINICAL DATA:  Pneumonia, complication suspected. Patient has been wheezing for a few days. Open heart surgery in October. Patient tested  COVID positive 2 days ago. EXAM: CT CHEST WITHOUT CONTRAST TECHNIQUE: Multidetector CT imaging of the chest was performed following the standard protocol without IV contrast. COMPARISON:  None. FINDINGS: Cardiovascular: The heart is mildly enlarged. Evidence of recent cardiac surgery. No significant pericardial effusion. Mediastinum/Nodes: No enlarged mediastinal or axillary lymph nodes. Thyroid gland, trachea, and esophagus demonstrate no significant findings. Lungs/Pleura: No evidence of pneumonia. Subsegmental atelectasis along the right major fissure. Bibasilar atelectasis. Small left pleural effusion. No evidence of pulmonary edema. Upper Abdomen: Status post cholecystectomy. Calcification about the right adrenal. Mild pancreatic atrophy. No acute upper abdominal process. Musculoskeletal: Sternotomy wires are intact. Diffuse idiopathic skeletal hyperostosis. IMPRESSION: 1.  Cardiomegaly with postsurgical changes. 2. Bibasilar atelectasis without evidence of pneumonia or pulmonary edema. 3.  Small left pleural effusion. Electronically Signed   By: Keane Police D.O.   On: 04/21/2021 10:08   ECHOCARDIOGRAM COMPLETE  Result Date: 05/16/2021    ECHOCARDIOGRAM REPORT   Patient Name:   BEAR OSTEN Date of Exam: 05/16/2021 Medical Rec #:  [id number]     Height:       65.0 in Accession #:    [id number]    Weight:       192.0 lb Date of Birth:  April 09, 1951      BSA:          1.944 m Patient Age:    6 years      BP:           110/86 mmHg Patient Gender: M             HR:           70 bpm. Exam Location:  [facility] Procedure: 2D Echo, Cardiac Doppler and Color Doppler Indications:     CHF-acute systolic M19.62  History:         Patient has no prior history of Echocardiogram examinations.                  CHF; Risk Factors:Diabetes and Hypertension.  Sonographer:     Sherrie Sport Referring Phys:  [id number] JAN A [place] Diagnosing Phys: Serafina Royals MD  Sonographer Comments: Suboptimal apical window. IMPRESSIONS  1. Left  ventricular ejection fraction, by estimation, is <20%. The left ventricle has severely decreased function. The left ventricle demonstrates global hypokinesis. The left ventricular internal cavity size was moderately to severely dilated. Left ventricular diastolic parameters are consistent with Grade I diastolic dysfunction (impaired relaxation).  2. Right ventricular systolic function  is normal. The right ventricular size is normal.  3. Left atrial size was mild to moderately dilated.  4. The mitral valve is normal in structure. Mild to moderate mitral valve regurgitation.  5. The aortic valve is normal in structure. Aortic valve regurgitation is trivial. FINDINGS  Left Ventricle: Left ventricular ejection fraction, by estimation, is <20%. The left ventricle has severely decreased function. The left ventricle demonstrates global hypokinesis. The left ventricular internal cavity size was moderately to severely dilated. There is borderline left ventricular hypertrophy. Left ventricular diastolic parameters are consistent with Grade I diastolic dysfunction (impaired relaxation). Right Ventricle: The right ventricular size is normal. No increase in right ventricular wall thickness. Right ventricular systolic function is normal. Left Atrium: Left atrial size was mild to moderately dilated. Right Atrium: Right atrial size was normal in size. Pericardium: There is no evidence of pericardial effusion. Mitral Valve: The mitral valve is normal in structure. Mild to moderate mitral valve regurgitation. Tricuspid Valve: The tricuspid valve is normal in structure. Tricuspid valve regurgitation is mild. Aortic Valve: The aortic valve is normal in structure. Aortic valve regurgitation is trivial. Aortic valve mean gradient measures 3.3 mmHg. Aortic valve peak gradient measures 6.0 mmHg. Aortic valve area, by VTI measures 1.05 cm. Pulmonic Valve: The pulmonic valve was normal in structure. Pulmonic valve regurgitation is not  visualized. Aorta: The aortic root and ascending aorta are structurally normal, with no evidence of dilitation. IAS/Shunts: No atrial level shunt detected by color flow Doppler.  LEFT VENTRICLE PLAX 2D LVIDd:         5.60 cm      Diastology LVIDs:         4.70 cm      LV e' medial:    3.15 cm/s LV PW:         1.20 cm      LV E/e' medial:  26.1 LV IVS:        0.90 cm      LV e' lateral:   7.40 cm/s LVOT diam:     2.10 cm      LV E/e' lateral: 11.1 LV SV:         23 LV SV Index:   12 LVOT Area:     3.46 cm  LV Volumes (MOD) LV vol d, MOD A4C: 177.0 ml LV vol s, MOD A4C: 127.0 ml LV SV MOD A4C:     177.0 ml RIGHT VENTRICLE RV S prime:     8.81 cm/s TAPSE (M-mode): 1.5 cm LEFT ATRIUM           Index LA diam:      4.90 cm 2.52 cm/m LA Vol (A4C): 79.9 ml 41.10 ml/m  AORTIC VALVE                    PULMONIC VALVE AV Area (Vmax):    0.96 cm     PV Vmax:        0.61 m/s AV Area (Vmean):   0.93 cm     PV Vmean:       39.500 cm/s AV Area (VTI):     1.05 cm     PV VTI:         0.104 m AV Vmax:           122.00 cm/s  PV Peak grad:   1.5 mmHg AV Vmean:          87.233 cm/s  PV Mean grad:   1.0 mmHg AV  VTI:            0.220 m      RVOT Peak grad: 3 mmHg AV Peak Grad:      6.0 mmHg AV Mean Grad:      3.3 mmHg LVOT Vmax:         33.90 cm/s LVOT Vmean:        23.300 cm/s LVOT VTI:          0.067 m LVOT/AV VTI ratio: 0.30  AORTA Ao Root diam: 3.20 cm MITRAL VALVE               TRICUSPID VALVE MV Area (PHT): 5.16 cm    TR Peak grad:   15.5 mmHg MV Decel Time: 147 msec    TR Vmax:        197.00 cm/s MV E velocity: 82.30 cm/s MV A velocity: 50.10 cm/s  SHUNTS MV E/A ratio:  1.64        Systemic VTI:  0.07 m                            Systemic Diam: 2.10 cm                            Pulmonic VTI:  0.133 m Serafina Royals MD Electronically signed by Serafina Royals MD Signature Date/Time: 05/16/2021/12:20:15 PM    Final     EKG: Sinus tachycardia. RBBB. PVCs.   ASSESSMENT AND PLAN:  Windell Musson is a 71 yo M with h/o coronary  artery disease (s/p recent CABG with a LIMA to LAD, SVG to ramus, and SVG to PDA on 02/16/21 with Dr. Rosalita Chessman), post-operative atrial fibrillation (currently rhythm controlled with Amiodarone and anticoagulated with Eliquis), a dilated cardiomyopathy (LVEF 30% per cardiac MRI in 01/27/21), history of frequent PVCs, type 2 diabetes, history of a TIA, history of penile cancer, and hypertension who is admitted with SOB and cough. Discovered to be COVID positive (initially positive 12/22) and volume overloaded.   # Acute hypoxic respiratory failure Has COVID and was volume overload on admission.  - Management of COVID per primary team - Lasix 60 mg IV BID  # Acute on Chronic HFrEF (LVEF <20% 05/16/21, previously 35%) BNP 1,172 (prior 627). Renal function is currently at baseline.  Appeared volume overloaded on admission, clinically improving with diuresis.  Continue LifeVest for low EF and PVCs until meeting with EP for secondary prevention. -discussed pt's case with his permission with Dr. Quentin Ore of [facility] EP who plans to see patient in the hospital tomorrow.  -Continue Home meds- lisinopril 2.5 mg, metoprolol XL 25 mg - Continue lasix 40 mg BID today and transition to PO dosing tomorrow.  -Echocardiogram with LVEF less than 20, results as above -the patient will need outpatient cardiology follow up 1-2 weeks after discharge, based on patient preference (either with established providers at [facility] or with Dr. Corky Sox).  # CAD s/p CABG x 3 Completed on 02/16/21, LIMA to LAD, SVG to PDA, SVG to ramus.  - remains with LifeVest that he should continue wearing until seen by electrophysiologist.  - Continue aspirin 81 mg - Continue atorvastatin 80 mg daily - Continue metoprolol XL 25 mg  # Atrial fibrillation - Continue eliquis 5 mg BID - Continue amiodarone 200 mg daily  This patient's case was discussed with Dr. Serafina Royals and he is in agreement with the plan above.  Signed: Tristan Schroeder,  PA-C 05/17/2021, 10:52 AM

## 2021-05-17 NOTE — Care Management Important Message (Signed)
Important Message  Patient Details  Name: Jermaine Kramer MRN: [id number] Date of Birth: 15-Aug-1950   Medicare Important Message Given:  Yes     Dannette Barbara 05/17/2021, 11:35 AM

## 2021-05-17 NOTE — Progress Notes (Signed)
PROGRESS NOTE    Jermaine Kramer  SNK:[id number] DOB: [birth year] DOA: 05/14/2021 PCP: Sherre Scarlet, PA-C    Brief Narrative:  71 y.o. Caucasian male with medical history significant for systolic CHF with dilated cardiomyopathy with EF of 35 to 40%, having LifeVest, coronary artery disease status post CABG, type  2diabetes mellitus, GERD, hypertension, COVID-19, dyslipidemia and CVA, who presented to the ER with acute onset of worsening dyspnea since yesterday as well as dry cough and wheezing.  He admitted to dyspnea on exertion and mild lower extremity edema.  He denied any significant paroxysmal nocturnal dyspnea.  No fever or chills.  No nausea or vomiting or abdominal pain.  No dysuria, oliguria or hematuria or flank pain.  Repeat echocardiogram with markedly reduced ejection fraction, LVEF less than 20%.  Previously 35 to 40%.  Also grade 1 diastolic dysfunction.   Assessment & Plan:   Principal Problem:   Acute CHF (congestive heart failure) (HCC)  Acute on chronic systolic congestive heart failure Ischemic cardiomyopathy, EF 35 to 40% status post LifeVest Coronary artery disease status post CABG in October 2022 Acute hypoxic respiratory failure, now resolved Patient had a prolonged hospitalization at [facility] late last year Three-vessel CABG in October Presents for worsening dyspnea, elevated BNP Signs and symptoms consistent with acute decompensated heart failure Repeat TTE with severely reduced EF, less than 20% with grade 1 diastolic dysfunction Net -7 L since admission Plan: IV Lasix 40 mg every 12 hours Target net -1-1.5 L daily Strict ins and outs Daily weights Daily aspirin Patient not on another antiplatelet agent GDMT with lisinopril and Toprol-XL Eliquis per home medication reconciliation Continue LifeVest Telemetry monitoring Cardiology, [place] [facility] is following.  Possible transition to p.o. diuretics on 1/18  Paroxysmal atrial fibrillation Rate  controlled Continue amiodarone and Toprol-XL Eliquis for anticoagulation Continue cardiac monitoring  Essential hypertension PTA amlodipine, metoprolol Lisinopril currently on hold  Hyperlipidemia PTA statin  BPH Flomax  GERD PPI  Type 2 diabetes mellitus Basal bolus regimen Carb modified diet  AKI Likely in the setting of diuresis Furosemide dose decreased to 40 IV twice daily Lisinopril held Recheck kidney function in a.m.   DVT prophylaxis: Eliquis Code Status: Full Family Communication: Wife at bedside 1/15, via phone 1/16, at bedside 1/17 Disposition Plan: Status is: Inpatient  Remains inpatient appropriate because: Acute decompensated systolic heart failure in setting of coronary artery disease.  Markedly reduced ejection fraction.  Cardiology following.       Level of care: Telemetry Cardiac  Consultants:  Cardiology  Procedures:  None  Antimicrobials: None   Subjective: Patient seen and examined.  Feels well overall.  Continues to diurese well.  Wife at bedside.  Objective: Vitals:   05/17/21 0400 05/17/21 0720 05/17/21 1109 05/17/21 1150  BP: 118/85 105/84  120/82  Pulse: 83 84  83  Resp: 18 18  18   Temp: (!) 97.4 F (36.3 C) 98.1 F (36.7 C)  98.1 F (36.7 C)  TempSrc: Oral     SpO2: 97% 95%  97%  Weight:   83.4 kg   Height:        Intake/Output Summary (Last 24 hours) at 05/17/2021 1426 Last data filed at 05/17/2021 1424 Gross per 24 hour  Intake 240 ml  Output 2100 ml  Net -1860 ml   Filed Weights   05/14/21 1701 05/17/21 1109  Weight: 87.1 kg 83.4 kg    Examination:  General exam: No acute distress Respiratory system: Bibasilar crackles.  Normal work of  breathing.  Room air Cardiovascular system: S1-S2, regular rate, irregular rhythm, no murmurs, 1+ pitting edema bilateral Gastrointestinal system: Soft, NT/ND, normal bowel sounds Central nervous system: Alert and oriented. No focal neurological  deficits. Extremities: Symmetric 5 x 5 power. Skin: No rashes, lesions or ulcers Psychiatry: Judgement and insight appear normal. Mood & affect appropriate.     Data Reviewed: I have personally reviewed following labs and imaging studies  CBC: Recent Labs  Lab 05/14/21 1705 05/15/21 0638 05/16/21 0835  WBC 6.0 4.9 9.4  NEUTROABS  --   --  6.5  HGB 13.1 13.3 15.0  HCT 39.9 40.4 45.2  MCV 92.4 89.6 90.6  PLT 171 185 791   Basic Metabolic Panel: Recent Labs  Lab 05/14/21 1705 05/15/21 0638 05/16/21 0835 05/17/21 0900  NA 136 135 139 134*  K 4.1 4.2 3.7 3.9  CL 104 101 102 98  CO2 23 23 27 27   GLUCOSE 185* 302* 183* 319*  BUN 14 22 30* 30*  CREATININE 0.93 1.03 1.10 1.32*  CALCIUM 8.5* 8.6* 8.5* 8.6*  MG  --   --  1.8 1.8   GFR: Estimated Creatinine Clearance: 50.3 mL/min (A) (by C-G formula based on SCr of 1.32 mg/dL (H)). Liver Function Tests: No results for input(s): AST, ALT, ALKPHOS, BILITOT, PROT, ALBUMIN in the last 168 hours. No results for input(s): LIPASE, AMYLASE in the last 168 hours. No results for input(s): AMMONIA in the last 168 hours. Coagulation Profile: Recent Labs  Lab 05/14/21 1705  INR 1.1   Cardiac Enzymes: No results for input(s): CKTOTAL, CKMB, CKMBINDEX, TROPONINI in the last 168 hours. BNP (last 3 results) No results for input(s): PROBNP in the last 8760 hours. HbA1C: Recent Labs    05/16/21 0754  HGBA1C 7.8*   CBG: Recent Labs  Lab 05/16/21 2053 05/17/21 0720 05/17/21 1147 05/17/21 1148  GLUCAP 264* 192* 327* 300*   Lipid Profile: No results for input(s): CHOL, HDL, LDLCALC, TRIG, CHOLHDL, LDLDIRECT in the last 72 hours. Thyroid Function Tests: No results for input(s): TSH, T4TOTAL, FREET4, T3FREE, THYROIDAB in the last 72 hours. Anemia Panel: No results for input(s): VITAMINB12, FOLATE, FERRITIN, TIBC, IRON, RETICCTPCT in the last 72 hours. Sepsis Labs: No results for input(s): PROCALCITON, LATICACIDVEN in the last  168 hours.  Recent Results (from the past 240 hour(s))  Resp Panel by RT-PCR (Flu A&B, Covid) Nasopharyngeal Swab     Status: Abnormal   Collection Time: 05/15/21 12:10 AM   Specimen: Nasopharyngeal Swab; Nasopharyngeal(NP) swabs in vial transport medium  Result Value Ref Range Status   SARS Coronavirus 2 by RT PCR POSITIVE (A) NEGATIVE Final    Comment: (NOTE) SARS-CoV-2 target nucleic acids are DETECTED.  The SARS-CoV-2 RNA is generally detectable in upper respiratory specimens during the acute phase of infection. Positive results are indicative of the presence of the identified virus, but do not rule out bacterial infection or co-infection with other pathogens not detected by the test. Clinical correlation with patient history and other diagnostic information is necessary to determine patient infection status. The expected result is Negative.  Fact Sheet for Patients: EntrepreneurPulse.com.au  Fact Sheet for Healthcare Providers: IncredibleEmployment.be  This test is not yet approved or cleared by the [place] FDA and  has been authorized for detection and/or diagnosis of SARS-CoV-2 by FDA under an Emergency Use Authorization (EUA).  This EUA will remain in effect (meaning this test can be used) for the duration of  the COVID-19 declaration under Section 564(b)(1) of  the A ct, 21 U.S.C. section 360bbb-3(b)(1), unless the authorization is terminated or revoked sooner.     Influenza A by PCR NEGATIVE NEGATIVE Final   Influenza B by PCR NEGATIVE NEGATIVE Final    Comment: (NOTE) The Xpert Xpress SARS-CoV-2/FLU/RSV plus assay is intended as an aid in the diagnosis of influenza from Nasopharyngeal swab specimens and should not be used as a sole basis for treatment. Nasal washings and aspirates are unacceptable for Xpert Xpress SARS-CoV-2/FLU/RSV testing.  Fact Sheet for Patients: EntrepreneurPulse.com.au  Fact Sheet  for Healthcare Providers: IncredibleEmployment.be  This test is not yet approved or cleared by the [place] FDA and has been authorized for detection and/or diagnosis of SARS-CoV-2 by FDA under an Emergency Use Authorization (EUA). This EUA will remain in effect (meaning this test can be used) for the duration of the COVID-19 declaration under Section 564(b)(1) of the Act, 21 U.S.C. section 360bbb-3(b)(1), unless the authorization is terminated or revoked.  Performed at [facility], [address], [place], [place] [address]          Radiology Studies: ECHOCARDIOGRAM COMPLETE  Result Date: 05/16/2021    ECHOCARDIOGRAM REPORT   Patient Name:   TIN ENGRAM Date of Exam: 05/16/2021 Medical Rec #:  [id number]     Height:       65.0 in Accession #:    [id number]    Weight:       192.0 lb Date of Birth:  Jun 09, 1950      BSA:          1.944 m Patient Age:    71 years      BP:           110/86 mmHg Patient Gender: M             HR:           70 bpm. Exam Location:  [facility] Procedure: 2D Echo, Cardiac Doppler and Color Doppler Indications:     CHF-acute systolic U23.53  History:         Patient has no prior history of Echocardiogram examinations.                  CHF; Risk Factors:Diabetes and Hypertension.  Sonographer:     Sherrie Sport Referring Phys:  [id number] JAN A [place] Diagnosing Phys: Serafina Royals MD  Sonographer Comments: Suboptimal apical window. IMPRESSIONS  1. Left ventricular ejection fraction, by estimation, is <20%. The left ventricle has severely decreased function. The left ventricle demonstrates global hypokinesis. The left ventricular internal cavity size was moderately to severely dilated. Left ventricular diastolic parameters are consistent with Grade I diastolic dysfunction (impaired relaxation).  2. Right ventricular systolic function is normal. The right ventricular size is normal.  3. Left atrial size was mild to moderately dilated.  4. The mitral  valve is normal in structure. Mild to moderate mitral valve regurgitation.  5. The aortic valve is normal in structure. Aortic valve regurgitation is trivial. FINDINGS  Left Ventricle: Left ventricular ejection fraction, by estimation, is <20%. The left ventricle has severely decreased function. The left ventricle demonstrates global hypokinesis. The left ventricular internal cavity size was moderately to severely dilated. There is borderline left ventricular hypertrophy. Left ventricular diastolic parameters are consistent with Grade I diastolic dysfunction (impaired relaxation). Right Ventricle: The right ventricular size is normal. No increase in right ventricular wall thickness. Right ventricular systolic function is normal. Left Atrium: Left atrial size was mild to moderately dilated. Right Atrium: Right  atrial size was normal in size. Pericardium: There is no evidence of pericardial effusion. Mitral Valve: The mitral valve is normal in structure. Mild to moderate mitral valve regurgitation. Tricuspid Valve: The tricuspid valve is normal in structure. Tricuspid valve regurgitation is mild. Aortic Valve: The aortic valve is normal in structure. Aortic valve regurgitation is trivial. Aortic valve mean gradient measures 3.3 mmHg. Aortic valve peak gradient measures 6.0 mmHg. Aortic valve area, by VTI measures 1.05 cm. Pulmonic Valve: The pulmonic valve was normal in structure. Pulmonic valve regurgitation is not visualized. Aorta: The aortic root and ascending aorta are structurally normal, with no evidence of dilitation. IAS/Shunts: No atrial level shunt detected by color flow Doppler.  LEFT VENTRICLE PLAX 2D LVIDd:         5.60 cm      Diastology LVIDs:         4.70 cm      LV e' medial:    3.15 cm/s LV PW:         1.20 cm      LV E/e' medial:  26.1 LV IVS:        0.90 cm      LV e' lateral:   7.40 cm/s LVOT diam:     2.10 cm      LV E/e' lateral: 11.1 LV SV:         23 LV SV Index:   12 LVOT Area:     3.46 cm   LV Volumes (MOD) LV vol d, MOD A4C: 177.0 ml LV vol s, MOD A4C: 127.0 ml LV SV MOD A4C:     177.0 ml RIGHT VENTRICLE RV S prime:     8.81 cm/s TAPSE (M-mode): 1.5 cm LEFT ATRIUM           Index LA diam:      4.90 cm 2.52 cm/m LA Vol (A4C): 79.9 ml 41.10 ml/m  AORTIC VALVE                    PULMONIC VALVE AV Area (Vmax):    0.96 cm     PV Vmax:        0.61 m/s AV Area (Vmean):   0.93 cm     PV Vmean:       39.500 cm/s AV Area (VTI):     1.05 cm     PV VTI:         0.104 m AV Vmax:           122.00 cm/s  PV Peak grad:   1.5 mmHg AV Vmean:          87.233 cm/s  PV Mean grad:   1.0 mmHg AV VTI:            0.220 m      RVOT Peak grad: 3 mmHg AV Peak Grad:      6.0 mmHg AV Mean Grad:      3.3 mmHg LVOT Vmax:         33.90 cm/s LVOT Vmean:        23.300 cm/s LVOT VTI:          0.067 m LVOT/AV VTI ratio: 0.30  AORTA Ao Root diam: 3.20 cm MITRAL VALVE               TRICUSPID VALVE MV Area (PHT): 5.16 cm    TR Peak grad:   15.5 mmHg MV Decel Time: 147 msec    TR Vmax:  197.00 cm/s MV E velocity: 82.30 cm/s MV A velocity: 50.10 cm/s  SHUNTS MV E/A ratio:  1.64        Systemic VTI:  0.07 m                            Systemic Diam: 2.10 cm                            Pulmonic VTI:  0.133 m Serafina Royals MD Electronically signed by Serafina Royals MD Signature Date/Time: 05/16/2021/12:20:15 PM    Final         Scheduled Meds:  amiodarone  200 mg Oral Daily   amLODipine  5 mg Oral Daily   apixaban  5 mg Oral BID   ascorbic acid  500 mg Oral Daily   aspirin EC  81 mg Oral Daily   atorvastatin  80 mg Oral Daily   furosemide  40 mg Intravenous BID   [START ON 05/18/2021] furosemide  40 mg Oral Daily   insulin aspart  0-15 Units Subcutaneous TID WC   insulin aspart  0-5 Units Subcutaneous QHS   metoprolol succinate  37.5 mg Oral Daily   pantoprazole  40 mg Oral Daily   tamsulosin  0.4 mg Oral Daily   zinc sulfate  220 mg Oral Daily   Continuous Infusions:   LOS: 3 days    Time spent: 35  minutes    Sidney Ace, MD [facility]   If 7PM-7AM, please contact night-coverage  05/17/2021, 2:26 PM

## 2021-05-17 NOTE — Consult Note (Signed)
° °  Heart Failure Nurse Navigator Note  HFrEF less than 20%.  Grade 1 diastolic dysfunction.  Normal right ventricular systolic function.  Mild to moderate left atrial enlargement.  Mild to moderate mitral regurgitation.  He presented to the emergency room with complaints of worsening shortness of breath, dry cough and wheezing.  Along with lower extremity edema.  Comorbidities:  Coronary artery disease status post CABG  Diabetes GERD Hypertension Dyslipidemia CVA Penile cancer Paroxysmal atrial fibrillation  He is wearing the LifeVest.  Medications:  Amiodarone 200 mg daily Amlodipine 5 mg daily Apixaban 5 mg 2 times a day Aspirin 81 mg daily Atorvastatin 80 mg daily Furosemide 40 mg IV 2 times a day Metoprolol succinate 37.5 mg daily Tamsulosin 0.4 mg daily   Labs:  Sodium 134, potassium 3.9, chloride 98, CO2 27, BUN 30, creatinine 1.32, magnesium 1.8.  Weight is 83.4 kg Blood pressure 120/82 Intake not documented Output 1975 mL  Initial meeting with patient and his wife Butch Penny.  They state that they have heard the term congestive heart failure before and states that it means his heart is not working as it should.  He is currently wearing a [place] prior to today that they had not received any formal heart failure education.  Went over the importance of 2000 mg sodium restriction, discussed foods to avoid.  Also discussed fluid restriction of more than no more than 64 ounces in a days time.  Stressed the importance of daily weight and recording and reporting a 2 to 3 pound weight gain overnight or 5 pounds within the week.  Also to report changes in symptoms as changes in shortness of breath, PND, orthopnea, swelling, lack of appetite etc.  Also made aware of the outpatient heart failure clinic [facility] at [facility].  He is given an appointment for January 25 at 8:30 in the morning.  He has a 0% no-show ratio.  Wife states that her husband is a picky eater,  offered to give her the low-sodium cookbook which she gladly would like.  We will continue to follow along.  He was given the living with heart failure teaching booklet, zone magnet, low-sodium information.  Pricilla Riffle RN [facility]FN

## 2021-05-18 ENCOUNTER — Encounter: Payer: Self-pay | Admitting: Family Medicine

## 2021-05-18 DIAGNOSIS — I4891 Unspecified atrial fibrillation: Secondary | ICD-10-CM

## 2021-05-18 DIAGNOSIS — I5022 Chronic systolic (congestive) heart failure: Secondary | ICD-10-CM

## 2021-05-18 DIAGNOSIS — I493 Ventricular premature depolarization: Secondary | ICD-10-CM

## 2021-05-18 DIAGNOSIS — I255 Ischemic cardiomyopathy: Secondary | ICD-10-CM

## 2021-05-18 DIAGNOSIS — I4510 Unspecified right bundle-branch block: Secondary | ICD-10-CM

## 2021-05-18 LAB — BASIC METABOLIC PANEL
Anion gap: 8 (ref 5–15)
BUN: 21 mg/dL (ref 8–23)
CO2: 28 mmol/L (ref 22–32)
Calcium: 8.7 mg/dL — ABNORMAL LOW (ref 8.9–10.3)
Chloride: 99 mmol/L (ref 98–111)
Creatinine, Ser: 1.01 mg/dL (ref 0.61–1.24)
GFR, Estimated: >60 mL/min (ref >60)
Glucose, Bld: 187 mg/dL — ABNORMAL HIGH (ref 70–99)
Potassium: 3.7 mmol/L (ref 3.5–5.1)
Sodium: 135 mmol/L (ref 135–145)

## 2021-05-18 LAB — GLUCOSE, CAPILLARY: Glucose-Capillary: 206 mg/dL — ABNORMAL HIGH (ref 70–99)

## 2021-05-18 LAB — MAGNESIUM: Magnesium: 2.2 mg/dL (ref 1.7–2.4)

## 2021-05-18 MED ORDER — METOPROLOL SUCCINATE ER 25 MG PO TB24: 37.5000 mg | ORAL_TABLET | Freq: Every day | ORAL | 0 refills | Status: DC

## 2021-05-18 MED ORDER — FUROSEMIDE 40 MG PO TABS: 40.0000 mg | ORAL_TABLET | Freq: Every day | ORAL | 0 refills | Status: DC

## 2021-05-18 MED ORDER — LISINOPRIL 5 MG PO TABS: 2.5000 mg | ORAL_TABLET | Freq: Every day | ORAL | Status: DC

## 2021-05-18 NOTE — Discharge Summary (Signed)
Physician Discharge Summary  Jermaine Kramer LPF:[id number] DOB: 24-May-1950 DOA: 05/14/2021  PCP: Sherre Scarlet, PA-C  Admit date: 05/14/2021 Discharge date: 05/18/2021  Discharge disposition: Home   Recommendations for Outpatient Follow-Up:   Follow-up with cardiologist in 1 week  Discharge Diagnosis:   Principal Problem:   Acute CHF (congestive heart failure) ([place])    Discharge Condition: Stable.  Diet recommendation:  Diet Order             Diet - low sodium heart healthy           Diet Carb Modified           Diet heart healthy/carb modified Room service appropriate? Yes; Fluid consistency: Thin  Diet effective now                     Code Status: Full Code     Hospital Course:   [name] is a 71 year old male with medical history significant for chronic systolic CHF (dilated cardiomyopathy) with EF of 35 to 40% with a LifeVest in place, CAD s/p CABG, type II DM, hypertension, GERD, dyslipidemia, stroke, history of COVID-19 infection.  He presented to the hospital because of lower extremity edema, increasing shortness of breath, cough and wheezing.  He was admitted to the hospital for acute on chronic systolic CHF complicated by acute hypoxemic respiratory failure.  He was treated with IV Lasix.  Repeat 2D echo showed worsening EF down to less than 20% and grade 1 diastolic dysfunction.  He was evaluated by the cardiologist and electrophysiologist.  Outpatient follow-up for dual-chamber single coil ICD discussion was recommended.  His condition has improved.  He was able to ambulate in the hallways on room air without any symptoms or oxygen desaturation.  He is deemed stable for discharge to home today.  Discharge plan was discussed with the patient and his wife, who was on speaker phone.   Medical Consultants:   Cardiologist, electrophysiologist   Discharge Exam:    Vitals:   05/17/21 2001 05/17/21 2302 05/18/21 0400 05/18/21 0810  BP: (!)  105/59 107/71 108/83 122/80  Pulse: 79 74    Resp: 20 20 18    Temp: (!) 97.5 F (36.4 C) 97.9 F (36.6 C) 98.1 F (36.7 C) 98.1 F (36.7 C)  TempSrc: Oral Oral Oral Oral  SpO2: 95% 97% 95% 95%  Weight:    82.7 kg  Height:         GEN: NAD SKIN: Warm and dry EYES: No pallor or icterus ENT: MMM CV: RRR, life vest in place PULM: CTA B ABD: soft, ND, NT, +BS CNS: AAO x 3, non focal EXT: No edema or tenderness   The results of significant diagnostics from this hospitalization (including imaging, microbiology, ancillary and laboratory) are listed below for reference.     Procedures and Diagnostic Studies:   ECHOCARDIOGRAM COMPLETE  Result Date: 05/16/2021    ECHOCARDIOGRAM REPORT   Patient Name:   Jermaine Kramer Date of Exam: 05/16/2021 Medical Rec #:  [id number]     Height:       65.0 in Accession #:    [id number]    Weight:       192.0 lb Date of Birth:  Aug 05, 1950      BSA:          1.944 m Patient Age:    66 years      BP:           110/86  mmHg Patient Gender: M             HR:           70 bpm. Exam Location:  [facility] Procedure: 2D Echo, Cardiac Doppler and Color Doppler Indications:     CHF-acute systolic K27.06  History:         Patient has no prior history of Echocardiogram examinations.                  CHF; Risk Factors:Diabetes and Hypertension.  Sonographer:     Sherrie Sport Referring Phys:  [id number] JAN A [place] Diagnosing Phys: Serafina Royals MD  Sonographer Comments: Suboptimal apical window. IMPRESSIONS  1. Left ventricular ejection fraction, by estimation, is <20%. The left ventricle has severely decreased function. The left ventricle demonstrates global hypokinesis. The left ventricular internal cavity size was moderately to severely dilated. Left ventricular diastolic parameters are consistent with Grade I diastolic dysfunction (impaired relaxation).  2. Right ventricular systolic function is normal. The right ventricular size is normal.  3. Left atrial size was mild to moderately  dilated.  4. The mitral valve is normal in structure. Mild to moderate mitral valve regurgitation.  5. The aortic valve is normal in structure. Aortic valve regurgitation is trivial. FINDINGS  Left Ventricle: Left ventricular ejection fraction, by estimation, is <20%. The left ventricle has severely decreased function. The left ventricle demonstrates global hypokinesis. The left ventricular internal cavity size was moderately to severely dilated. There is borderline left ventricular hypertrophy. Left ventricular diastolic parameters are consistent with Grade I diastolic dysfunction (impaired relaxation). Right Ventricle: The right ventricular size is normal. No increase in right ventricular wall thickness. Right ventricular systolic function is normal. Left Atrium: Left atrial size was mild to moderately dilated. Right Atrium: Right atrial size was normal in size. Pericardium: There is no evidence of pericardial effusion. Mitral Valve: The mitral valve is normal in structure. Mild to moderate mitral valve regurgitation. Tricuspid Valve: The tricuspid valve is normal in structure. Tricuspid valve regurgitation is mild. Aortic Valve: The aortic valve is normal in structure. Aortic valve regurgitation is trivial. Aortic valve mean gradient measures 3.3 mmHg. Aortic valve peak gradient measures 6.0 mmHg. Aortic valve area, by VTI measures 1.05 cm. Pulmonic Valve: The pulmonic valve was normal in structure. Pulmonic valve regurgitation is not visualized. Aorta: The aortic root and ascending aorta are structurally normal, with no evidence of dilitation. IAS/Shunts: No atrial level shunt detected by color flow Doppler.  LEFT VENTRICLE PLAX 2D LVIDd:         5.60 cm      Diastology LVIDs:         4.70 cm      LV e' medial:    3.15 cm/s LV PW:         1.20 cm      LV E/e' medial:  26.1 LV IVS:        0.90 cm      LV e' lateral:   7.40 cm/s LVOT diam:     2.10 cm      LV E/e' lateral: 11.1 LV SV:         23 LV SV Index:   12  LVOT Area:     3.46 cm  LV Volumes (MOD) LV vol d, MOD A4C: 177.0 ml LV vol s, MOD A4C: 127.0 ml LV SV MOD A4C:     177.0 ml RIGHT VENTRICLE RV S prime:     8.81 cm/s TAPSE (M-mode):  1.5 cm LEFT ATRIUM           Index LA diam:      4.90 cm 2.52 cm/m LA Vol (A4C): 79.9 ml 41.10 ml/m  AORTIC VALVE                    PULMONIC VALVE AV Area (Vmax):    0.96 cm     PV Vmax:        0.61 m/s AV Area (Vmean):   0.93 cm     PV Vmean:       39.500 cm/s AV Area (VTI):     1.05 cm     PV VTI:         0.104 m AV Vmax:           122.00 cm/s  PV Peak grad:   1.5 mmHg AV Vmean:          87.233 cm/s  PV Mean grad:   1.0 mmHg AV VTI:            0.220 m      RVOT Peak grad: 3 mmHg AV Peak Grad:      6.0 mmHg AV Mean Grad:      3.3 mmHg LVOT Vmax:         33.90 cm/s LVOT Vmean:        23.300 cm/s LVOT VTI:          0.067 m LVOT/AV VTI ratio: 0.30  AORTA Ao Root diam: 3.20 cm MITRAL VALVE               TRICUSPID VALVE MV Area (PHT): 5.16 cm    TR Peak grad:   15.5 mmHg MV Decel Time: 147 msec    TR Vmax:        197.00 cm/s MV E velocity: 82.30 cm/s MV A velocity: 50.10 cm/s  SHUNTS MV E/A ratio:  1.64        Systemic VTI:  0.07 m                            Systemic Diam: 2.10 cm                            Pulmonic VTI:  0.133 m Serafina Royals MD Electronically signed by Serafina Royals MD Signature Date/Time: 05/16/2021/12:20:15 PM    Final      Labs:   Basic Metabolic Panel: Recent Labs  Lab 05/14/21 1705 05/15/21 0638 05/16/21 0835 05/17/21 0900 05/18/21 0626  NA 136 135 139 134* 135  K 4.1 4.2 3.7 3.9 3.7  CL 104 101 102 98 99  CO2 23 23 27 27 28   GLUCOSE 185* 302* 183* 319* 187*  BUN 14 22 30* 30* 21  CREATININE 0.93 1.03 1.10 1.32* 1.01  CALCIUM 8.5* 8.6* 8.5* 8.6* 8.7*  MG  --   --  1.8 1.8 2.2   GFR Estimated Creatinine Clearance: 65.5 mL/min (by C-G formula based on SCr of 1.01 mg/dL). Liver Function Tests: No results for input(s): AST, ALT, ALKPHOS, BILITOT, PROT, ALBUMIN in the last 168  hours. No results for input(s): LIPASE, AMYLASE in the last 168 hours. No results for input(s): AMMONIA in the last 168 hours. Coagulation profile Recent Labs  Lab 05/14/21 1705  INR 1.1    CBC: Recent Labs  Lab 05/14/21 1705 05/15/21 0638 05/16/21 0835  WBC 6.0 4.9  9.4  NEUTROABS  --   --  6.5  HGB 13.1 13.3 15.0  HCT 39.9 40.4 45.2  MCV 92.4 89.6 90.6  PLT 171 185 269   Cardiac Enzymes: No results for input(s): CKTOTAL, CKMB, CKMBINDEX, TROPONINI in the last 168 hours. BNP: Invalid input(s): POCBNP CBG: Recent Labs  Lab 05/17/21 1147 05/17/21 1148 05/17/21 1655 05/17/21 2114 05/18/21 0817  GLUCAP 327* 300* 118* 254* 206*   D-Dimer No results for input(s): DDIMER in the last 72 hours. Hgb A1c Recent Labs    05/16/21 0754  HGBA1C 7.8*   Lipid Profile No results for input(s): CHOL, HDL, LDLCALC, TRIG, CHOLHDL, LDLDIRECT in the last 72 hours. Thyroid function studies No results for input(s): TSH, T4TOTAL, T3FREE, THYROIDAB in the last 72 hours.  Invalid input(s): FREET3 Anemia work up No results for input(s): VITAMINB12, FOLATE, FERRITIN, TIBC, IRON, RETICCTPCT in the last 72 hours. Microbiology Recent Results (from the past 240 hour(s))  Resp Panel by RT-PCR (Flu A&B, Covid) Nasopharyngeal Swab     Status: Abnormal   Collection Time: 05/15/21 12:10 AM   Specimen: Nasopharyngeal Swab; Nasopharyngeal(NP) swabs in vial transport medium  Result Value Ref Range Status   SARS Coronavirus 2 by RT PCR POSITIVE (A) NEGATIVE Final    Comment: (NOTE) SARS-CoV-2 target nucleic acids are DETECTED.  The SARS-CoV-2 RNA is generally detectable in upper respiratory specimens during the acute phase of infection. Positive results are indicative of the presence of the identified virus, but do not rule out bacterial infection or co-infection with other pathogens not detected by the test. Clinical correlation with patient history and other diagnostic information is  necessary to determine patient infection status. The expected result is Negative.  Fact Sheet for Patients: EntrepreneurPulse.com.au  Fact Sheet for Healthcare Providers: IncredibleEmployment.be  This test is not yet approved or cleared by the [place] FDA and  has been authorized for detection and/or diagnosis of SARS-CoV-2 by FDA under an Emergency Use Authorization (EUA).  This EUA will remain in effect (meaning this test can be used) for the duration of  the COVID-19 declaration under Section 564(b)(1) of the A ct, 21 U.S.C. section 360bbb-3(b)(1), unless the authorization is terminated or revoked sooner.     Influenza A by PCR NEGATIVE NEGATIVE Final   Influenza B by PCR NEGATIVE NEGATIVE Final    Comment: (NOTE) The Xpert Xpress SARS-CoV-2/FLU/RSV plus assay is intended as an aid in the diagnosis of influenza from Nasopharyngeal swab specimens and should not be used as a sole basis for treatment. Nasal washings and aspirates are unacceptable for Xpert Xpress SARS-CoV-2/FLU/RSV testing.  Fact Sheet for Patients: EntrepreneurPulse.com.au  Fact Sheet for Healthcare Providers: IncredibleEmployment.be  This test is not yet approved or cleared by the [place] FDA and has been authorized for detection and/or diagnosis of SARS-CoV-2 by FDA under an Emergency Use Authorization (EUA). This EUA will remain in effect (meaning this test can be used) for the duration of the COVID-19 declaration under Section 564(b)(1) of the Act, 21 U.S.C. section 360bbb-3(b)(1), unless the authorization is terminated or revoked.  Performed at [facility], [address] [place], [place] [address]      Discharge Instructions:   Discharge Instructions     AMB referral to CHF clinic   Complete by: As directed    Diet - low sodium heart healthy   Complete by: As directed    Diet Carb Modified    Complete by: As directed    Increase activity slowly  Complete by: As directed       Allergies as of 05/18/2021   No Known Allergies      Medication List     STOP taking these medications    ascorbic acid 500 MG tablet Commonly known as: VITAMIN C   guaiFENesin-dextromethorphan 100-10 MG/5ML syrup Commonly known as: ROBITUSSIN DM   zinc sulfate 220 (50 Zn) MG capsule       TAKE these medications    amiodarone 200 MG tablet Commonly known as: PACERONE Take 200 mg by mouth daily.   apixaban 5 MG Tabs tablet Commonly known as: ELIQUIS Take 5 mg by mouth 2 (two) times daily.   aspirin EC 81 MG tablet Take 81 mg by mouth daily.   atorvastatin 80 MG tablet Commonly known as: LIPITOR Take 80 mg by mouth daily.   furosemide 40 MG tablet Commonly known as: LASIX Take 1 tablet (40 mg total) by mouth daily. What changed:  medication strength how much to take   lisinopril 2.5 MG tablet Commonly known as: ZESTRIL Take 2.5 mg by mouth daily.   metFORMIN 1000 MG tablet Commonly known as: GLUCOPHAGE Take 1,000 mg by mouth 2 (two) times daily with a meal.   metoprolol succinate 25 MG 24 hr tablet Commonly known as: TOPROL-XL Take 1.5 tablets (37.5 mg total) by mouth daily. What changed: how much to take   omeprazole 40 MG capsule Commonly known as: PRILOSEC Take 40 mg by mouth daily.   tamsulosin 0.4 MG Caps capsule Commonly known as: FLOMAX Take 0.4 mg by mouth daily.        Follow-up Information     Andrez Grime, MD. Go in 1 week(s).   Specialty: Cardiology Why: 1-2 weeks after discharge Contact information: [place] [address] [phone]         Vickie Epley, MD. Go in 1 week(s).   Specialties: Cardiology, Radiology Why: 1-2 weeks after discharge Contact information: [place] [address] [phone]                   If you experience worsening of your  admission symptoms, develop shortness of breath, life threatening emergency, suicidal or homicidal thoughts you must seek medical attention immediately by calling 911 or calling your MD immediately  if symptoms less severe.   You must read complete instructions/literature along with all the possible adverse reactions/side effects for all the medicines you take and that have been prescribed to you. Take any new medicines after you have completely understood and accept all the possible adverse reactions/side effects.    Please note   You were cared for by a hospitalist during your hospital stay. If you have any questions about your discharge medications or the care you received while you were in the hospital after you are discharged, you can call the unit and asked to speak with the hospitalist on call if the hospitalist that took care of you is not available. Once you are discharged, your primary care physician will handle any further medical issues. Please note that NO REFILLS for any discharge medications will be authorized once you are discharged, as it is imperative that you return to your primary care physician (or establish a relationship with a primary care physician if you do not have one) for your aftercare needs so that they can reassess your need for medications and monitor your lab values.       Time coordinating discharge:  33 minutes  Signed:  Iyannah Blake  [facility] 05/18/2021, 9:53 AM   Pager on [email or web address]. If 7PM-7AM, please contact night-coverage at [email or web address]

## 2021-05-18 NOTE — Progress Notes (Signed)
[facility]  CARDIOLOGY CONSULT NOTE  Patient ID: Jermaine Kramer MRN: [id number] DOB/AGE: 73-Nov-1952 73 y.o.  Admit date: 05/14/2021 Referring Physician Ralene Muskrat Primary Physician [name], [name], PA-C Primary Cardiologist [name] Reason for Consultation AoCHF  HPI:  Jermaine Kramer is a 71 yo M with h/o coronary artery disease (s/p recent CABG with a LIMA to LAD, SVG to ramus, and SVG to PDA on 02/16/21 with Dr. Rosalita Chessman), post-operative atrial fibrillation (currently rhythm controlled with Amiodarone and anticoagulated with Eliquis), a dilated cardiomyopathy (LVEF 30% per cardiac MRI in 01/27/21), history of frequent PVCs, type 2 diabetes, history of a TIA, history of penile cancer, and hypertension who is admitted with shortness of breath and cough, thought to be related to CHF; persistently COVID positive. Cardiology is consulted for assistance in management of heart failure.   Interval History: -states he is feeling well, denies chest pain, SOB, palpitations -slight bump in Cr yesterday, held afternoon dose of IV lasix and renal function is back to baseline today and appears euvolemic -still on O2 by [place] and has not been ambulating much   Review of systems complete and found to be negative unless listed above   Past Medical History:  Diagnosis Date   Cancer ([place])    leukemia   CHF (congestive heart failure) (HCC)    Diabetes mellitus without complication ([place])    GERD (gastroesophageal reflux disease)    Hypercholesteremia    Hypertension    Stroke [facility])     Past Surgical History:  Procedure Laterality Date   CHOLECYSTECTOMY     COLONOSCOPY WITH PROPOFOL N/A 04/14/2019   Procedure: COLONOSCOPY WITH PROPOFOL;  Surgeon: Tol[place], Benay Pike, MD;  Location: [facility] ENDOSCOPY;  Service: Gastroenterology;  Laterality: N/A;   KNEE ARTHROSCOPY     RENAL CYST EXCISION      Medications Prior to Admission  Medication Sig Dispense Refill Last Dose   amiodarone (PACERONE)  200 MG tablet Take 200 mg by mouth daily.   05/14/2021 at Unknown   apixaban (ELIQUIS) 5 MG TABS tablet Take 5 mg by mouth 2 (two) times daily.   05/14/2021 at 2000   ascorbic acid (VITAMIN C) 500 MG tablet Take 1 tablet (500 mg total) by mouth daily. 30 tablet 0    aspirin EC 81 MG tablet Take 81 mg by mouth daily.      atorvastatin (LIPITOR) 80 MG tablet Take 80 mg by mouth daily.   05/14/2021 at Unknown   furosemide (LASIX) 20 MG tablet Take 20 mg by mouth daily.   05/14/2021 at 0800   guaiFENesin-dextromethorphan (ROBITUSSIN DM) 100-10 MG/5ML syrup Take 10 mLs by mouth every 4 (four) hours as needed for cough. 118 mL 0 Unknown at PRN   lisinopril (ZESTRIL) 2.5 MG tablet Take 2.5 mg by mouth daily.   05/14/2021 at Unknown   metFORMIN (GLUCOPHAGE) 1000 MG tablet Take 1,000 mg by mouth 2 (two) times daily with a meal.   05/14/2021 at 1700   metoprolol succinate (TOPROL-XL) 25 MG 24 hr tablet Take 25 mg by mouth daily.   05/14/2021 at Unknown   omeprazole (PRILOSEC) 40 MG capsule Take 40 mg by mouth daily.   05/14/2021 at 0700   tamsulosin (FLOMAX) 0.4 MG CAPS capsule Take 0.4 mg by mouth daily.   05/14/2021 at Unknown   zinc sulfate 220 (50 Zn) MG capsule Take 1 capsule (220 mg total) by mouth daily. 30 capsule 0     Social History   Socioeconomic History  Marital status: Married    Spouse name: Not on file   Number of children: Not on file   Years of education: Not on file   Highest education level: Not on file  Occupational History   Not on file  Tobacco Use   Smoking status: Never   Smokeless tobacco: Never  Vaping Use   Vaping Use: Never used  Substance and Sexual Activity   Alcohol use: Not Currently   Drug use: No   Sexual activity: Not on file  Other Topics Concern   Not on file  Social History Narrative   Not on file   Social Determinants of Health   Financial Resource Strain: Not on file  Food Insecurity: Not on file  Transportation Needs: Not on file  Physical Activity:  Not on file  Stress: Not on file  Social Connections: Not on file  Intimate Partner Violence: Not on file    Family History  Problem Relation Age of Onset   Parkinson's disease Mother    Lupus Mother    Heart disease Father       Review of systems complete and found to be negative unless listed above      PHYSICAL EXAM  General: Pleasant elderly caucasian male. Well developed, well nourished, in no acute distress.  Examined sitting upright in recliner with wife and son at bedside HEENT:  Normocephalic and atramatic Neck:  No JVD at 90 degrees.  Lungs: normal work of breathing on o2 by [place]. Clear bilaterally to auscultation. Heart: regular rate with PVCs. Normal S1 and S2 without gallops or murmurs. LifeVest in place Abdomen: Nondistended appearing Msk: Normal strength and tone for age. Extremities: No clubbing, cyanosis or edema.   Neuro: Alert and oriented X 3. Psych:  Good affect, responds appropriately  Labs:   Lab Results  Component Value Date   WBC 9.4 05/16/2021   HGB 15.0 05/16/2021   HCT 45.2 05/16/2021   MCV 90.6 05/16/2021   PLT 269 05/16/2021    Recent Labs  Lab 05/18/21 0626  NA 135  K 3.7  CL 99  CO2 28  BUN 21  CREATININE 1.01  CALCIUM 8.7*  GLUCOSE 187*    Lab Results  Component Value Date   TROPONINI < 0.02 10/19/2013    No results found for: CHOL No results found for: HDL No results found for: LDLCALC No results found for: TRIG No results found for: CHOLHDL No results found for: LDLDIRECT    Radiology: DG Neck Soft Tissue  Result Date: 04/21/2021 CLINICAL DATA:  71 year old male with wheezing. Shortness of breath. Heart surgery in October. EXAM: NECK SOFT TISSUES - 1+ VIEW COMPARISON:  CT cervical spine 10/27/2010. FINDINGS: Large chronic anterior endplate osteophyte at V7-Q4. Prominent chronic C2-C3 endplate osteophytosis also. Normal prevertebral soft tissue contour. Pharyngeal and epiglottis contours within normal limits.  Bilateral cervical carotid calcified atherosclerosis. Visualized tracheal air column is within normal limits. No acute osseous abnormality identified. Negative visible upper chest. IMPRESSION: Negative; calcified carotid atherosclerosis and large chronic anterior cervical endplate osteophytes. Electronically Signed   By: Genevie Ann M.D.   On: 04/21/2021 06:00   DG Chest 2 View  Result Date: 05/14/2021 CLINICAL DATA:  sob EXAM: CHEST - 2 VIEW.  Patient is rotated on frontal view. COMPARISON:  Chest x-ray 04/21/2021, CT chest 04/21/2021 FINDINGS: Likely defibrillator vest overlying patient. The heart and mediastinal contours are grossly unchanged given patient rotation. Coronary bypass surgical changes noted overlying the mediastinum. Poorly visualized right heart  border on frontal view likely due to patient rotation with no definite right middle lobe collapse on lateral view. No focal consolidation. Chronic coarsened markings with no overt pulmonary edema. No pleural effusion. No pneumothorax. No acute osseous abnormality. IMPRESSION: No active cardiopulmonary disease. Electronically Signed   By: Iven Finn M.D.   On: 05/14/2021 18:08   DG Chest 2 View  Result Date: 04/21/2021 CLINICAL DATA:  71 year old male with wheezing. Shortness of breath. Heart surgery in October. EXAM: CHEST - 2 VIEW COMPARISON:  04/28/2017. FINDINGS: PA and lateral views of the chest. Interval CABG. Mediastinal contours remain within normal limits. Stable lung volumes. No pneumothorax, pulmonary edema, pleural effusion or confluent pulmonary opacity. Visualized tracheal air column is within normal limits. No acute osseous abnormality identified. Stable cholecystectomy clips. Paucity of bowel gas in the upper abdomen. IMPRESSION: No acute cardiopulmonary abnormality. Electronically Signed   By: Genevie Ann M.D.   On: 04/21/2021 05:58   CT CHEST WO CONTRAST  Result Date: 04/21/2021 CLINICAL DATA:  Pneumonia, complication suspected.  Patient has been wheezing for a few days. Open heart surgery in October. Patient tested COVID positive 2 days ago. EXAM: CT CHEST WITHOUT CONTRAST TECHNIQUE: Multidetector CT imaging of the chest was performed following the standard protocol without IV contrast. COMPARISON:  None. FINDINGS: Cardiovascular: The heart is mildly enlarged. Evidence of recent cardiac surgery. No significant pericardial effusion. Mediastinum/Nodes: No enlarged mediastinal or axillary lymph nodes. Thyroid gland, trachea, and esophagus demonstrate no significant findings. Lungs/Pleura: No evidence of pneumonia. Subsegmental atelectasis along the right major fissure. Bibasilar atelectasis. Small left pleural effusion. No evidence of pulmonary edema. Upper Abdomen: Status post cholecystectomy. Calcification about the right adrenal. Mild pancreatic atrophy. No acute upper abdominal process. Musculoskeletal: Sternotomy wires are intact. Diffuse idiopathic skeletal hyperostosis. IMPRESSION: 1.  Cardiomegaly with postsurgical changes. 2. Bibasilar atelectasis without evidence of pneumonia or pulmonary edema. 3.  Small left pleural effusion. Electronically Signed   By: Keane Police D.O.   On: 04/21/2021 10:08   ECHOCARDIOGRAM COMPLETE  Result Date: 05/16/2021    ECHOCARDIOGRAM REPORT   Patient Name:   Jermaine Kramer Date of Exam: 05/16/2021 Medical Rec #:  [id number]     Height:       65.0 in Accession #:    [id number]    Weight:       192.0 lb Date of Birth:  Jan 18, 1951      BSA:          1.944 m Patient Age:    11 years      BP:           110/86 mmHg Patient Gender: M             HR:           70 bpm. Exam Location:  [facility] Procedure: 2D Echo, Cardiac Doppler and Color Doppler Indications:     CHF-acute systolic H84.69  History:         Patient has no prior history of Echocardiogram examinations.                  CHF; Risk Factors:Diabetes and Hypertension.  Sonographer:     Sherrie Sport Referring Phys:  [id number] JAN A [place] Diagnosing Phys: Serafina Royals MD  Sonographer Comments: Suboptimal apical window. IMPRESSIONS  1. Left ventricular ejection fraction, by estimation, is <20%. The left ventricle has severely decreased function. The left ventricle demonstrates global hypokinesis. The left ventricular internal cavity size was moderately to  severely dilated. Left ventricular diastolic parameters are consistent with Grade I diastolic dysfunction (impaired relaxation).  2. Right ventricular systolic function is normal. The right ventricular size is normal.  3. Left atrial size was mild to moderately dilated.  4. The mitral valve is normal in structure. Mild to moderate mitral valve regurgitation.  5. The aortic valve is normal in structure. Aortic valve regurgitation is trivial. FINDINGS  Left Ventricle: Left ventricular ejection fraction, by estimation, is <20%. The left ventricle has severely decreased function. The left ventricle demonstrates global hypokinesis. The left ventricular internal cavity size was moderately to severely dilated. There is borderline left ventricular hypertrophy. Left ventricular diastolic parameters are consistent with Grade I diastolic dysfunction (impaired relaxation). Right Ventricle: The right ventricular size is normal. No increase in right ventricular wall thickness. Right ventricular systolic function is normal. Left Atrium: Left atrial size was mild to moderately dilated. Right Atrium: Right atrial size was normal in size. Pericardium: There is no evidence of pericardial effusion. Mitral Valve: The mitral valve is normal in structure. Mild to moderate mitral valve regurgitation. Tricuspid Valve: The tricuspid valve is normal in structure. Tricuspid valve regurgitation is mild. Aortic Valve: The aortic valve is normal in structure. Aortic valve regurgitation is trivial. Aortic valve mean gradient measures 3.3 mmHg. Aortic valve peak gradient measures 6.0 mmHg. Aortic valve area, by VTI measures 1.05 cm. Pulmonic Valve: The  pulmonic valve was normal in structure. Pulmonic valve regurgitation is not visualized. Aorta: The aortic root and ascending aorta are structurally normal, with no evidence of dilitation. IAS/Shunts: No atrial level shunt detected by color flow Doppler.  LEFT VENTRICLE PLAX 2D LVIDd:         5.60 cm      Diastology LVIDs:         4.70 cm      LV e' medial:    3.15 cm/s LV PW:         1.20 cm      LV E/e' medial:  26.1 LV IVS:        0.90 cm      LV e' lateral:   7.40 cm/s LVOT diam:     2.10 cm      LV E/e' lateral: 11.1 LV SV:         23 LV SV Index:   12 LVOT Area:     3.46 cm  LV Volumes (MOD) LV vol d, MOD A4C: 177.0 ml LV vol s, MOD A4C: 127.0 ml LV SV MOD A4C:     177.0 ml RIGHT VENTRICLE RV S prime:     8.81 cm/s TAPSE (M-mode): 1.5 cm LEFT ATRIUM           Index LA diam:      4.90 cm 2.52 cm/m LA Vol (A4C): 79.9 ml 41.10 ml/m  AORTIC VALVE                    PULMONIC VALVE AV Area (Vmax):    0.96 cm     PV Vmax:        0.61 m/s AV Area (Vmean):   0.93 cm     PV Vmean:       39.500 cm/s AV Area (VTI):     1.05 cm     PV VTI:         0.104 m AV Vmax:           122.00 cm/s  PV Peak grad:   1.5 mmHg AV  Vmean:          87.233 cm/s  PV Mean grad:   1.0 mmHg AV VTI:            0.220 m      RVOT Peak grad: 3 mmHg AV Peak Grad:      6.0 mmHg AV Mean Grad:      3.3 mmHg LVOT Vmax:         33.90 cm/s LVOT Vmean:        23.300 cm/s LVOT VTI:          0.067 m LVOT/AV VTI ratio: 0.30  AORTA Ao Root diam: 3.20 cm MITRAL VALVE               TRICUSPID VALVE MV Area (PHT): 5.16 cm    TR Peak grad:   15.5 mmHg MV Decel Time: 147 msec    TR Vmax:        197.00 cm/s MV E velocity: 82.30 cm/s MV A velocity: 50.10 cm/s  SHUNTS MV E/A ratio:  1.64        Systemic VTI:  0.07 m                            Systemic Diam: 2.10 cm                            Pulmonic VTI:  0.133 m Serafina Royals MD Electronically signed by Serafina Royals MD Signature Date/Time: 05/16/2021/12:20:15 PM    Final     EKG: Sinus tachycardia. RBBB.  PVCs.   ASSESSMENT AND PLAN:  Jermaine Kramer is a 71 yo M with h/o coronary artery disease (s/p recent CABG with a LIMA to LAD, SVG to ramus, and SVG to PDA on 02/16/21 with Dr. Rosalita Chessman), post-operative atrial fibrillation (currently rhythm controlled with Amiodarone and anticoagulated with Eliquis), a dilated cardiomyopathy (LVEF 30% per cardiac MRI in 01/27/21), history of frequent PVCs, type 2 diabetes, history of a TIA, history of penile cancer, and hypertension who is admitted with SOB and cough. Discovered to be COVID positive (initially positive 12/22) and volume overloaded.   # Acute hypoxic respiratory failure Has COVID and was volume overload on admission. Clinically improving.  - Management of COVID per primary team  # Acute on Chronic HFrEF (LVEF <20% 05/16/21, previously 35%) BNP 1,172 (prior 627). Renal function is currently at baseline.  Appeared volume overloaded on admission, clinically improved with diuresis.   -Continue LifeVest at discharge for secondary prevention. -appreciate Dr. Mardene Speak recommendations. Will keep pt on amiodarone and metoprolol as below.  -Continue Home meds- lisinopril 2.5 mg, metoprolol XL 25 mg -s/p lasix 40mg  IV BID, held afternoon dose yesterday d/t bump in Cr. Now renal function is back to baseline.  -He is safe for discharge from a cardiac standpoint today, provided he is ambulating without difficulty. He will need outpatient cardiology and electrophysiology follow up 1-2 weeks after discharge, based on patient preference with Dr. Corky Sox and with Dr. Quentin Ore.  # CAD s/p CABG x 3 Completed on 02/16/21, LIMA to LAD, SVG to PDA, SVG to ramus.  - remains with LifeVest that he should continue wearing until seen by electrophysiologist.  - Continue aspirin 81 mg - Continue atorvastatin 80 mg daily - increased metoprolol XL to 37.5mg  (was on 25mg  at home) based on pt's frequent PVCs and ventricular trigeminy. Dose increase limited by pt's blood pressure (low  315-945O systolic)   # Atrial fibrillation - Continue eliquis 5 mg BID - Continue amiodarone 200 mg daily  This patient's case was discussed with Dr. Serafina Royals and he is in agreement with the plan above.  Signed: Tristan Schroeder, PA-C 05/18/2021, 8:39 AM

## 2021-05-18 NOTE — TOC Transition Note (Signed)
Transition of Care [facility]) - CM/SW Discharge Note   Patient Details  Name: Jermaine Kramer MRN: [id number] Date of Birth: 1951-04-12  [facility] CM/SW Contact:  Alberteen Sam, LCSW Phone Number: 05/18/2021, 9:55 AM   Clinical Narrative:     Patient to discharge home today, is active with [facility] home health for PT and RN. Malachy Mood with [facility] has been informed of discharge today and PT and RN home health orders are in.   No further discharge needs identified at this time.    Final next level of care: [place] Barriers to Discharge: No Barriers Identified   Patient Goals and CMS Choice Patient states their goals for this hospitalization and ongoing recovery are:: to go home CMS Medicare.gov Compare Post Acute Care list provided to:: Patient Choice offered to / list presented to : Patient  Discharge Placement                    Patient and family notified of of transfer: 05/18/21  Discharge Plan and Services                          HH Arranged: PT, OT [facility] Agency: [place] Date [place]: 05/18/21 Time [place]: [phone] Representative spoke with at [place]: [place] ([place]) Interventions     Readmission Risk Interventions No flowsheet data found.

## 2021-05-18 NOTE — Consult Note (Signed)
Electrophysiology consultation:   Patient ID: Jermaine Kramer MRN: [id number]; DOB: [birth year]  Admit date: 05/14/2021 Date of Consult: 05/18/2021  PCP:  Sherre Scarlet, PA-C   [facility] Providers Cardiologist:  None    Patient Profile:   Jermaine Kramer is a 71 y.o. male with a hx of chronic systolic heart failure secondary to ischemic cardiomyopathy, significant coronary artery disease post two-vessel bypass surgery in October 1517 with a complicated postoperative course, heparin-induced thrombocytopenia, postoperative atrial fibrillation, penile cancer, hypertension, history of COVID-19 infection in December 2022 and TIA who is being seen 05/18/2021 for the evaluation of ischemic cardiomyopathy at the request of Jacquiline Doe, PA-C and Dr. Nehemiah Massed.Marland Kitchen  History of Present Illness:   Jermaine Kramer presented to [facility] on May 14, 2021 with shortness of breath, cough, lower extremity edema and paroxysmal nocturnal dyspnea.  When he presented to the emergency department he was found to be significantly volume overloaded.  His clinical status is improved with IV diuresis.  Echocardiogram during this hospitalization shows a persistently reduced left ventricular function.  I have reviewed his [facility] records.  He has seen Dr. Tobe Sos in the past with [facility] cardiology.  He was discharged with a LifeVest after his bypass surgery because of a low ejection fraction and frequent PVCs.  He is maintained on amiodarone 200 mg by mouth once daily and Eliquis twice daily for history of atrial fibrillation.  From chart review looks like he was also referred to Dr. Nancy Fetter [facility] electrophysiology) for frequent PVCs.  Apparently PVC ablation was discussed in the past but this was deferred because of his ischemic disease.  Today he is with his wife in the hospital room.  He tells me he is feeling better since being admitted.       Past Medical History:  Diagnosis Date   Cancer ([place])    leukemia   CHF  (congestive heart failure) (HCC)    Diabetes mellitus without complication (HCC)    GERD (gastroesophageal reflux disease)    Hypercholesteremia    Hypertension    Stroke [facility])     Past Surgical History:  Procedure Laterality Date   CHOLECYSTECTOMY     COLONOSCOPY WITH PROPOFOL N/A 04/14/2019   Procedure: COLONOSCOPY WITH PROPOFOL;  Surgeon: Toledo, Benay Pike, MD;  Location: [facility] ENDOSCOPY;  Service: Gastroenterology;  Laterality: N/A;   KNEE ARTHROSCOPY     RENAL CYST EXCISION         Inpatient Medications: Scheduled Meds:  amiodarone  200 mg Oral Daily   amLODipine  5 mg Oral Daily   apixaban  5 mg Oral BID   ascorbic acid  500 mg Oral Daily   aspirin EC  81 mg Oral Daily   atorvastatin  80 mg Oral Daily   furosemide  40 mg Oral Daily   insulin aspart  0-15 Units Subcutaneous TID WC   insulin aspart  0-5 Units Subcutaneous QHS   metoprolol succinate  37.5 mg Oral Daily   pantoprazole  40 mg Oral Daily   tamsulosin  0.4 mg Oral Daily   zinc sulfate  220 mg Oral Daily   Continuous Infusions:  PRN Meds: acetaminophen **OR** acetaminophen, guaiFENesin-dextromethorphan, magnesium hydroxide, ondansetron **OR** ondansetron (ZOFRAN) IV, traZODone  Allergies:   No Known Allergies  Social History:   Social History   Socioeconomic History   Marital status: Married    Spouse name: Not on file   Number of children: Not on file   Years of education: Not on  file   Highest education level: Not on file  Occupational History   Not on file  Tobacco Use   Smoking status: Never   Smokeless tobacco: Never  Vaping Use   Vaping Use: Never used  Substance and Sexual Activity   Alcohol use: Not Currently   Drug use: No   Sexual activity: Not on file  Other Topics Concern   Not on file  Social History Narrative   Not on file   Social Determinants of Health   Financial Resource Strain: Not on file  Food Insecurity: Not on file  Transportation Needs: Not on file  Physical  Activity: Not on file  Stress: Not on file  Social Connections: Not on file  Intimate Partner Violence: Not on file    Family History:    Family History  Problem Relation Age of Onset   Parkinson's disease Mother    Lupus Mother    Heart disease Father      ROS:  Please see the history of present illness.   All other ROS reviewed and negative.     Physical Exam/Data:   Vitals:   05/17/21 1656 05/17/21 2001 05/17/21 2302 05/18/21 0400  BP: 110/68 (!) 105/59 107/71 108/83  Pulse: (!) 101 79 74   Resp: 19 20 20 18   Temp: 98 F (36.7 C) (!) 97.5 F (36.4 C) 97.9 F (36.6 C) 98.1 F (36.7 C)  TempSrc: Oral Oral Oral Oral  SpO2: 96% 95% 97% 95%  Weight:      Height:        Intake/Output Summary (Last 24 hours) at 05/18/2021 0756 Last data filed at 05/18/2021 0400 Gross per 24 hour  Intake 290 ml  Output 3050 ml  Net -2760 ml   Last 3 Weights 05/17/2021 05/14/2021 04/21/2021  Weight (lbs) 183 lb 13.8 oz 192 lb 189 lb 9.5 oz  Weight (kg) 83.4 kg 87.091 kg 86 kg     Body mass index is 30.6 kg/m.  General:  Well nourished, well developed, in no acute distress in bed at 30 degrees HEENT: normal Neck: no JVD Vascular: No carotid bruits; Distal pulses 2+ bilaterally Cardiac:  normal S1, S2; RRR; no murmur  Lungs:  clear to auscultation bilaterally, no wheezing, rhonchi or rales  Abd: soft, nontender, no hepatomegaly  Ext: no edema Musculoskeletal:  No deformities, BUE and BLE strength normal and equal Skin: warm and dry  Neuro:  CNs 2-12 intact, no focal abnormalities noted Psych:  Normal affect   EKG:  The EKG was personally reviewed and demonstrates:   Sinus rhythm.  Right bundle branch block that is old.    Telemetry:  Telemetry was personally reviewed and demonstrates: Sinus rhythm with PVCs.  Relevant CV Studies:  May 16, 2021 echo personally reviewed Left ventricular function around 20 to 25%.  Thinned and echo bright myocardium in the septum and  parts of the inferior wall.  Laboratory Data:  High Sensitivity Troponin:   Recent Labs  Lab 04/21/21 0528 04/21/21 0629 05/14/21 1705 05/14/21 1940  TROPONINIHS 23* 27* 28* 30*     Chemistry Recent Labs  Lab 05/15/21 0638 05/16/21 0835 05/17/21 0900  NA 135 139 134*  K 4.2 3.7 3.9  CL 101 102 98  CO2 23 27 27   GLUCOSE 302* 183* 319*  BUN 22 30* 30*  CREATININE 1.03 1.10 1.32*  CALCIUM 8.6* 8.5* 8.6*  MG  --  1.8 1.8  GFRNONAA >60 >60 57*  ANIONGAP 11 10 9  No results for input(s): PROT, ALBUMIN, AST, ALT, ALKPHOS, BILITOT in the last 168 hours. Lipids No results for input(s): CHOL, TRIG, HDL, LABVLDL, LDLCALC, CHOLHDL in the last 168 hours.  Hematology Recent Labs  Lab 05/14/21 1705 05/15/21 0638 05/16/21 0835  WBC 6.0 4.9 9.4  RBC 4.32 4.51 4.99  HGB 13.1 13.3 15.0  HCT 39.9 40.4 45.2  MCV 92.4 89.6 90.6  MCH 30.3 29.5 30.1  MCHC 32.8 32.9 33.2  RDW 12.9 12.9 13.1  PLT 171 185 269   Thyroid No results for input(s): TSH, FREET4 in the last 168 hours.  BNP Recent Labs  Lab 05/14/21 1705  BNP 1,172.8*    DDimer No results for input(s): DDIMER in the last 168 hours.   Radiology/Studies:  DG Chest 2 View  Result Date: 05/14/2021 CLINICAL DATA:  sob EXAM: CHEST - 2 VIEW.  Patient is rotated on frontal view. COMPARISON:  Chest x-ray 04/21/2021, CT chest 04/21/2021 FINDINGS: Likely defibrillator vest overlying patient. The heart and mediastinal contours are grossly unchanged given patient rotation. Coronary bypass surgical changes noted overlying the mediastinum. Poorly visualized right heart border on frontal view likely due to patient rotation with no definite right middle lobe collapse on lateral view. No focal consolidation. Chronic coarsened markings with no overt pulmonary edema. No pleural effusion. No pneumothorax. No acute osseous abnormality. IMPRESSION: No active cardiopulmonary disease. Electronically Signed   By: Iven Finn M.D.   On:  05/14/2021 18:08   ECHOCARDIOGRAM COMPLETE  Result Date: 05/16/2021    ECHOCARDIOGRAM REPORT   Patient Name:   MACKLEN WILHOITE Date of Exam: 05/16/2021 Medical Rec #:  [id number]     Height:       65.0 in Accession #:    [id number]    Weight:       192.0 lb Date of Birth:  Sep 10, 1950      BSA:          1.944 m Patient Age:    32 years      BP:           110/86 mmHg Patient Gender: M             HR:           70 bpm. Exam Location:  [facility] Procedure: 2D Echo, Cardiac Doppler and Color Doppler Indications:     CHF-acute systolic J00.93  History:         Patient has no prior history of Echocardiogram examinations.                  CHF; Risk Factors:Diabetes and Hypertension.  Sonographer:     Sherrie Sport Referring Phys:  [id number] JAN A [place] Diagnosing Phys: Serafina Royals MD  Sonographer Comments: Suboptimal apical window. IMPRESSIONS  1. Left ventricular ejection fraction, by estimation, is <20%. The left ventricle has severely decreased function. The left ventricle demonstrates global hypokinesis. The left ventricular internal cavity size was moderately to severely dilated. Left ventricular diastolic parameters are consistent with Grade I diastolic dysfunction (impaired relaxation).  2. Right ventricular systolic function is normal. The right ventricular size is normal.  3. Left atrial size was mild to moderately dilated.  4. The mitral valve is normal in structure. Mild to moderate mitral valve regurgitation.  5. The aortic valve is normal in structure. Aortic valve regurgitation is trivial. FINDINGS  Left Ventricle: Left ventricular ejection fraction, by estimation, is <20%. The left ventricle has severely decreased function. The left ventricle demonstrates global hypokinesis.  The left ventricular internal cavity size was moderately to severely dilated. There is borderline left ventricular hypertrophy. Left ventricular diastolic parameters are consistent with Grade I diastolic dysfunction (impaired relaxation). Right  Ventricle: The right ventricular size is normal. No increase in right ventricular wall thickness. Right ventricular systolic function is normal. Left Atrium: Left atrial size was mild to moderately dilated. Right Atrium: Right atrial size was normal in size. Pericardium: There is no evidence of pericardial effusion. Mitral Valve: The mitral valve is normal in structure. Mild to moderate mitral valve regurgitation. Tricuspid Valve: The tricuspid valve is normal in structure. Tricuspid valve regurgitation is mild. Aortic Valve: The aortic valve is normal in structure. Aortic valve regurgitation is trivial. Aortic valve mean gradient measures 3.3 mmHg. Aortic valve peak gradient measures 6.0 mmHg. Aortic valve area, by VTI measures 1.05 cm. Pulmonic Valve: The pulmonic valve was normal in structure. Pulmonic valve regurgitation is not visualized. Aorta: The aortic root and ascending aorta are structurally normal, with no evidence of dilitation. IAS/Shunts: No atrial level shunt detected by color flow Doppler.  LEFT VENTRICLE PLAX 2D LVIDd:         5.60 cm      Diastology LVIDs:         4.70 cm      LV e' medial:    3.15 cm/s LV PW:         1.20 cm      LV E/e' medial:  26.1 LV IVS:        0.90 cm      LV e' lateral:   7.40 cm/s LVOT diam:     2.10 cm      LV E/e' lateral: 11.1 LV SV:         23 LV SV Index:   12 LVOT Area:     3.46 cm  LV Volumes (MOD) LV vol d, MOD A4C: 177.0 ml LV vol s, MOD A4C: 127.0 ml LV SV MOD A4C:     177.0 ml RIGHT VENTRICLE RV S prime:     8.81 cm/s TAPSE (M-mode): 1.5 cm LEFT ATRIUM           Index LA diam:      4.90 cm 2.52 cm/m LA Vol (A4C): 79.9 ml 41.10 ml/m  AORTIC VALVE                    PULMONIC VALVE AV Area (Vmax):    0.96 cm     PV Vmax:        0.61 m/s AV Area (Vmean):   0.93 cm     PV Vmean:       39.500 cm/s AV Area (VTI):     1.05 cm     PV VTI:         0.104 m AV Vmax:           122.00 cm/s  PV Peak grad:   1.5 mmHg AV Vmean:          87.233 cm/s  PV Mean grad:   1.0  mmHg AV VTI:            0.220 m      RVOT Peak grad: 3 mmHg AV Peak Grad:      6.0 mmHg AV Mean Grad:      3.3 mmHg LVOT Vmax:         33.90 cm/s LVOT Vmean:        23.300 cm/s LVOT VTI:  0.067 m LVOT/AV VTI ratio: 0.30  AORTA Ao Root diam: 3.20 cm MITRAL VALVE               TRICUSPID VALVE MV Area (PHT): 5.16 cm    TR Peak grad:   15.5 mmHg MV Decel Time: 147 msec    TR Vmax:        197.00 cm/s MV E velocity: 82.30 cm/s MV A velocity: 50.10 cm/s  SHUNTS MV E/A ratio:  1.64        Systemic VTI:  0.07 m                            Systemic Diam: 2.10 cm                            Pulmonic VTI:  0.133 m Serafina Royals MD Electronically signed by Serafina Royals MD Signature Date/Time: 05/16/2021/12:20:15 PM    Final      Assessment and Plan:   Jermaine Kramer is a 71 year old man who I am seeing today for an evaluation of his ischemic cardiomyopathy and risk of sudden cardiac death.  I am also weighing in about his frequent PVCs for which she is also seen cardiologist at [facility] and here with [facility] clinic.  He has a complicated past medical history including ischemic disease post bypass surgery with a complicated postoperative course.  Relevant medical history also includes history of heparin-induced thrombocytopenia.  Chronic systolic heart failure secondary to ischemic cardiomyopathy Revascularization in October 2022.  Echo this admission shows persistently reduced left ventricular function.  Spent a great deal of time during my visit with the patient his wife today discussing his risk of sudden cardiac death given his history of ischemic cardiomyopathy.  His echo shows evidence of scar within the left ventricle and an ejection fraction of 20 to 25%.  I think he is a candidate for defibrillator therapy given his persistently reduced left ventricular function.  He has NYHA class III symptoms.  He does have a right bundle branch block on EKG that is old with a QRS duration of 146 ms.  His echo does  not show significant dyssynchrony.  I would plan on a dual-chamber single coil ICD.  Given his current hospitalization with decompensated heart failure, I do not think this hospitalization is the appropriate time to implant this.  I would plan on seeing him back in my clinic in about 2 weeks.  If he is doing well and stable at that visit, would plan to implant the defibrillator at the next available slot.  We could implant here at [facility] regional on Wednesday morning.  Would plan on a Medtr[employer] device. PVCs Frequent PVCs.  These are also noted on his post bypass progress reports at [facility].  I do not think an invasive approach to PVC management is the best strategy at this point.  I would favor continued medical therapy with amiodarone and up titration of his beta-blocker as his hemodynamics allow.  History of heparin-induced thrombocytopenia further complicates ablation consideration. Atrial fibrillation Continue amiodarone and Eliquis.  I discussed the possibility of receiving care here at [facility] versus getting his care at [facility].  I think both would be reasonable options.  The patient his wife expressed an interest to continue receiving care here at [facility].  I will have my clinic schedule appointment in 2 to 3 weeks here in [place].  We  will put a tentative ICD implant 1 to 2 weeks after that appointment here in [place].    For questions or updates, please contact [place] Please consult [email or web address] for contact info under    Signed, Vickie Epley, MD  05/18/2021 7:56 AM

## 2021-05-23 ENCOUNTER — Telehealth: Payer: Self-pay

## 2021-05-23 NOTE — Telephone Encounter (Signed)
Post hospital discharge phone call   Called and spoke with both the patient and the wife.  He states that he had been bothered by nausea over the weekend but is feeling much better this morning.  He states he is to be followed by home health but they have not started seeing him yet.  He has not missed any of his medications, did not have any questions and his wife is the one that is setting up his medications.  They state that he has not had to take an extra water pill since discharge.  Trying to follow the low-sodium diet and fluid restriction as well as they can.  His discharge weight was 184 pounds and today he weighed 181.1.  Denied experiencing any weight gain, increased shortness of breath or swelling or being unable to lie flat.  This appointment was changed to February 2 at 9 AM when he was discharged from the hospital both his cardiologist and the heart failure appointment were made within the same week.  Had no further questions.  Pricilla Riffle RN CHFN

## 2021-05-25 ENCOUNTER — Ambulatory Visit: Payer: Medicare Other | Admitting: Family

## 2021-05-27 ENCOUNTER — Other Ambulatory Visit
Admission: RE | Admit: 2021-05-27 | Discharge: 2021-05-27 | Disposition: A | Discharge status: Home / Self Care | Payer: Medicare Other | Source: Ambulatory Visit | Attending: Cardiology | Admitting: Cardiology

## 2021-05-27 DIAGNOSIS — I420 Dilated cardiomyopathy: Secondary | ICD-10-CM | POA: Diagnosis present

## 2021-05-27 DIAGNOSIS — I25118 Atherosclerotic heart disease of native coronary artery with other forms of angina pectoris: Secondary | ICD-10-CM | POA: Insufficient documentation

## 2021-05-27 LAB — BRAIN NATRIURETIC PEPTIDE: B Natriuretic Peptide: 512 pg/mL — ABNORMAL HIGH (ref 0.0–100.0)

## 2021-06-01 NOTE — Progress Notes (Signed)
Patient ID: Jermaine Kramer, male    DOB: 05-Jan-1951, 71 y.o.   MRN: [id number]  HPI  Jermaine Kramer is a 71 y/o male with a history of leukemia, DM, CAD (CABG 02/16/21), AF, sleep apnea, hyperlipidemia, HTN, stroke, GERD & chronic heart failure.   Echo report from 05/16/21 reviewed and showed and EF of <20% along with mild/moderate LAE and mild/moderate Jermaine.   Admitted 05/14/21 due to lower extremity edema, increasing shortness of breath, cough and wheezing. Initially given IV lasix with transition to oral diuretics. Cardiology consult obtained. Discharged after 4 days.   He presents today for his initial visit with a chief complaint of minimal shortness of breath upon moderate exertion. He describes this as chronic in nature having been present for several months. He has associated pedal edema along with this. He denies any difficulty sleeping, dizziness, abdominal distention, palpitations, chest pain, cough, fatigue or weight gain.   Continues to wear lifevest without any shocking. Has EP appointment to discuss AICD.   Currently holding spironolactone RX most likely due to BP. Has had furosemide and metoprolol decreased due to BP.   Past Medical History:  Diagnosis Date   Arrhythmia    atrial fibrillation   Cancer [facility])    leukemia   CHF (congestive heart failure) (HCC)    Coronary artery disease    Diabetes mellitus without complication (HCC)    GERD (gastroesophageal reflux disease)    Hypercholesteremia    Hypertension    Sleep apnea    Stroke [facility])    Past Surgical History:  Procedure Laterality Date   CHOLECYSTECTOMY     COLONOSCOPY WITH PROPOFOL N/A 04/14/2019   Procedure: COLONOSCOPY WITH PROPOFOL;  Surgeon: Toledo, Benay Pike, MD;  Location: [facility] ENDOSCOPY;  Service: Gastroenterology;  Laterality: N/A;   CORONARY ARTERY BYPASS GRAFT  02/16/2021   KNEE ARTHROSCOPY     RENAL CYST EXCISION     Family History  Problem Relation Age of Onset   Parkinson's disease Mother    Lupus  Mother    Heart disease Father    Social History   Tobacco Use   Smoking status: Never   Smokeless tobacco: Never  Substance Use Topics   Alcohol use: Not Currently   Allergies  Allergen Reactions   Doxycycline Anaphylaxis    Patient does not recall having this reaction.     Empagliflozin Itching    Groin infection Caused a groin infection.     Heparin Other (See Comments)   Prior to Admission medications   Medication Sig Start Date End Date Taking? Authorizing Provider  amiodarone (PACERONE) 200 MG tablet Take 200 mg by mouth daily. 03/06/21  Yes [provider]  apixaban (ELIQUIS) 5 MG TABS tablet Take 5 mg by mouth 2 (two) times daily.   Yes [provider]  aspirin EC 81 MG tablet Take 81 mg by mouth daily.   Yes [provider]  atorvastatin (LIPITOR) 80 MG tablet Take 80 mg by mouth daily. 02/10/21  Yes [provider]  furosemide (LASIX) 40 MG tablet Take 1 tablet (40 mg total) by mouth daily. Patient taking differently: Take 20 mg by mouth daily. 05/18/21  Yes Jennye Boroughs, MD  metFORMIN (GLUCOPHAGE) 1000 MG tablet Take 1,000 mg by mouth 2 (two) times daily with a meal.   Yes [provider]  metoprolol succinate (TOPROL-XL) 25 MG 24 hr tablet Take 1.5 tablets (37.5 mg total) by mouth daily. Patient taking differently: Take 25 mg by mouth  daily. 05/18/21  Yes Jennye Boroughs, MD  omeprazole (PRILOSEC) 40 MG capsule Take 40 mg by mouth daily. 03/11/21  Yes [provider]  tamsulosin (FLOMAX) 0.4 MG CAPS capsule Take 0.8 mg by mouth daily.   Yes [provider]  lisinopril (ZESTRIL) 2.5 MG tablet Take 1 tablet (2.5 mg total) by mouth daily. 06/02/21   Alisa Graff, FNP   Review of Systems  Constitutional:  Negative for appetite change and fatigue.  HENT:  Negative for congestion, postnasal drip and sore throat.   Eyes: Negative.   Respiratory:  Positive for shortness of breath. Negative for cough.    Cardiovascular:  Positive for leg swelling. Negative for chest pain and palpitations.  Gastrointestinal:  Negative for abdominal distention and abdominal pain.  Endocrine: Negative.   Genitourinary: Negative.   Musculoskeletal:  Negative for back pain and neck pain.  Skin: Negative.   Allergic/Immunologic: Negative.   Neurological:  Negative for dizziness and light-headedness.  Hematological:  Negative for adenopathy. Does not bruise/bleed easily.  Psychiatric/Behavioral:  Negative for dysphoric mood and sleep disturbance. The patient is not nervous/anxious.    . Vitals:   06/02/21 0842  BP: (!) 129/59  Pulse: 77  Resp: 18  SpO2: 98%  Weight: 186 lb (84.4 kg)  Height: 5\' 4"  (1.626 m)   Wt Readings from Last 3 Encounters:  06/02/21 186 lb (84.4 kg)  05/18/21 182 lb 6.4 oz (82.7 kg)  04/21/21 189 lb 9.5 oz (86 kg)   Lab Results  Component Value Date   CREATININE 1.01 05/18/2021   CREATININE 1.32 (H) 05/17/2021   CREATININE 1.10 05/16/2021    Physical Exam Vitals and nursing note reviewed. Exam conducted with a chaperone present (wife).  Constitutional:      Appearance: Normal appearance.  HENT:     Head: Normocephalic and atraumatic.  Cardiovascular:     Rate and Rhythm: Normal rate and regular rhythm.  Pulmonary:     Effort: Pulmonary effort is normal. No respiratory distress.     Breath sounds: No wheezing or rales.  Abdominal:     General: There is no distension.     Palpations: Abdomen is soft.     Tenderness: There is no abdominal tenderness.  Musculoskeletal:     Cervical back: Normal range of motion and neck supple.     Right lower leg: Edema (trace pitting) present.     Left lower leg: No edema.  Skin:    General: Skin is warm and dry.  Neurological:     General: No focal deficit present.     Mental Status: He is alert and oriented to person, place, and time.  Psychiatric:        Mood and Affect: Mood normal.        Behavior: Behavior normal.         Thought Content: Thought content normal.    Assessment & Plan:  1: Chronic heart failure with reduced ejection fraction- - NYHA class II - euvolemic today - weighing daily and home weight chart reviewed; reminded to call for an overnight weight gain of > 2 pounds or a weekly weight gain of >5 pounds - not adding salt and has been ready food labels for sodium content; understands to keep daily sodium intake to 2000mg  / day - on GDMT of metoprolol and lisinopril - allergic to empagliflozin (groin infection) - BP has been low so spironolactone is currently on hold; consider transition from lisinopril to entresto if BP becomes more  consistent - currently wearing lifevest; sees EP Quentin Ore) 06/15/21 to discuss AICD - BNP 512.0 - PharmD reconciled medications with the patient  2: HTN- - BP looks good (129/59); BP at cardiology visit last week was 92/48 - saw PCP [name]) 05/31/21 - BMP 05/27/21 reviewed and showed sodium 140, potassium 3.7, creatinine 1.5 & GFR 46  3: DM- - A1c 05/16/21 was 7.8% - currently on metformin  4: Atrial fibrillation- - saw cardiology Corky Sox) 05/27/21 - currently on amiodarone and apixaban  5: CAD- - CABG with a LIMA to LAD, SVG to ramus, and SVG to PDA on 02/16/21   Medication bottles reviewed.   Return in 6 weeks, sooner if needed.

## 2021-06-02 ENCOUNTER — Encounter: Payer: Self-pay | Admitting: Family

## 2021-06-02 ENCOUNTER — Ambulatory Visit: Payer: Medicare Other | Attending: Family | Admitting: Family

## 2021-06-02 ENCOUNTER — Other Ambulatory Visit: Payer: Self-pay

## 2021-06-02 VITALS — BP 129/59 | HR 77 | Resp 18 | Ht 64.0 in | Wt 186.0 lb

## 2021-06-02 DIAGNOSIS — R600 Localized edema: Secondary | ICD-10-CM | POA: Diagnosis not present

## 2021-06-02 DIAGNOSIS — K219 Gastro-esophageal reflux disease without esophagitis: Secondary | ICD-10-CM | POA: Insufficient documentation

## 2021-06-02 DIAGNOSIS — Z951 Presence of aortocoronary bypass graft: Secondary | ICD-10-CM | POA: Insufficient documentation

## 2021-06-02 DIAGNOSIS — I4891 Unspecified atrial fibrillation: Secondary | ICD-10-CM | POA: Insufficient documentation

## 2021-06-02 DIAGNOSIS — I2510 Atherosclerotic heart disease of native coronary artery without angina pectoris: Secondary | ICD-10-CM | POA: Diagnosis not present

## 2021-06-02 DIAGNOSIS — Z856 Personal history of leukemia: Secondary | ICD-10-CM | POA: Insufficient documentation

## 2021-06-02 DIAGNOSIS — Z888 Allergy status to other drugs, medicaments and biological substances status: Secondary | ICD-10-CM | POA: Diagnosis not present

## 2021-06-02 DIAGNOSIS — I110 Hypertensive heart disease with heart failure: Secondary | ICD-10-CM | POA: Diagnosis present

## 2021-06-02 DIAGNOSIS — G4730 Sleep apnea, unspecified: Secondary | ICD-10-CM | POA: Insufficient documentation

## 2021-06-02 DIAGNOSIS — I480 Paroxysmal atrial fibrillation: Secondary | ICD-10-CM | POA: Diagnosis not present

## 2021-06-02 DIAGNOSIS — E119 Type 2 diabetes mellitus without complications: Secondary | ICD-10-CM | POA: Diagnosis not present

## 2021-06-02 DIAGNOSIS — I5022 Chronic systolic (congestive) heart failure: Secondary | ICD-10-CM | POA: Insufficient documentation

## 2021-06-02 DIAGNOSIS — Z8673 Personal history of transient ischemic attack (TIA), and cerebral infarction without residual deficits: Secondary | ICD-10-CM | POA: Diagnosis not present

## 2021-06-02 DIAGNOSIS — E785 Hyperlipidemia, unspecified: Secondary | ICD-10-CM | POA: Insufficient documentation

## 2021-06-02 DIAGNOSIS — Z7901 Long term (current) use of anticoagulants: Secondary | ICD-10-CM | POA: Diagnosis not present

## 2021-06-02 DIAGNOSIS — I10 Essential (primary) hypertension: Secondary | ICD-10-CM

## 2021-06-02 DIAGNOSIS — Z7984 Long term (current) use of oral hypoglycemic drugs: Secondary | ICD-10-CM | POA: Insufficient documentation

## 2021-06-02 MED ORDER — FUROSEMIDE 40 MG PO TABS: 40.0000 mg | ORAL_TABLET | Freq: Once | ORAL | Status: DC

## 2021-06-02 MED ORDER — LISINOPRIL 2.5 MG PO TABS: 2.5000 mg | ORAL_TABLET | Freq: Every day | ORAL | 5 refills | Status: DC

## 2021-06-02 NOTE — Patient Instructions (Signed)
Continue weighing daily and call for an overnight weight gain of 3 pounds or more or a weekly weight gain of more than 5 pounds.  ° °The Heart Failure Clinic will be moving around the corner to [id number] mid-February. Our phone number will remain the same ° °

## 2021-06-02 NOTE — Progress Notes (Signed)
Jermaine Kramer - PHARMACIST COUNSELING NOTE  Guideline-Directed Medical Therapy/Evidence Based Medicine  ACE/ARB/ARNI: Lisinopril 2.5 mg daily Beta Blocker: Metoprolol succinate 25 mg daily decreased from 37.5 mg due to low HR (35-55) Aldosterone Antagonist:  None, pt has a prescription for it but cardiology told patient not to take it.  Diuretic: Furosemide 20 mg daily decreased from 40 mg daily.  SGLT2i:  none pt has swollen testicle when taking it in the past.   Adherence Assessment  Do you ever forget to take your medication? [] Yes [x] No  Do you ever skip doses due to side effects? [] Yes [x] No  Do you have trouble affording your medicines? [] Yes [x] No  Are you ever unable to pick up your medication due to transportation difficulties? [] Yes [x] No  Do you ever stop taking your medications because you don't believe they are helping? [] Yes [x] No  Do you check your weight daily? [x] Yes [] No   Adherence strategy: Medications are managed and assigned by wife   Barriers to obtaining medications: none stated by the patient.   Vital signs: HR 77, BP 129/59, weight (pounds) 186 (pt weighted himself at home) ECHO: Date 05/2021, EF 20,   BMP Latest Ref Rng & Units 05/18/2021 05/17/2021 05/16/2021  Glucose 70 - 99 mg/dL 187(H) 319(H) 183(H)  BUN 8 - 23 mg/dL 21 30(H) 30(H)  Creatinine 0.61 - 1.24 mg/dL 1.01 1.32(H) 1.10  Sodium 135 - 145 mmol/L 135 134(L) 139  Potassium 3.5 - 5.1 mmol/L 3.7 3.9 3.7  Chloride 98 - 111 mmol/L 99 98 102  CO2 22 - 32 mmol/L 28 27 27   Calcium 8.9 - 10.3 mg/dL 8.7(L) 8.6(L) 8.5(L)    Past Medical History:  Diagnosis Date   Arrhythmia    atrial fibrillation   Cancer (HCC)    leukemia   CHF (congestive heart failure) (HCC)    Coronary artery disease    Diabetes mellitus without complication (HCC)    GERD (gastroesophageal reflux disease)    Hypercholesteremia    Hypertension    Sleep apnea    Stroke [facility])      ASSESSMENT 71 year old male who presents to the HF clinic for a new patient appointment. Overall, patient is doing well and has no issues obtaining or taking medications. Pt logs blood pressures and weights regularly. Medications were adjusted based on HR and kidney function. Scr was back to baseline on 05/18/2021. Pt needs refill on lisinopril.   PLAN HF/HTN:  Continue current medications listed above. Potentially transition on entresto once blood pressure tolerates. At next visit and new labs are drawn, patient may benefit from starting spironolactone.   Afib:  Continue amiodarone, metoprolol, and apixaban.  Monitor K+ and Mg.   DM:  Continue metformin. Recommended patient to be open to retry GLP-1. Explain the GI side effects are normal with GLP-1s and the titration of the medication should be slow. GLP-1 have good CV protective properties that may be benefital since pt cannot tolerate SGLT2.     Time spent: 20 minutes  Jermaine Kramer, Pharm.D. Clinical Pharmacist 06/02/2021 9:32 AM    Current Outpatient Medications:    amiodarone (PACERONE) 200 MG tablet, Take 200 mg by mouth daily., Disp: , Rfl:    apixaban (ELIQUIS) 5 MG TABS tablet, Take 5 mg by mouth 2 (two) times daily., Disp: , Rfl:    aspirin EC 81 MG tablet, Take 81 mg by mouth daily., Disp: , Rfl:    atorvastatin (LIPITOR) 80 MG  tablet, Take 80 mg by mouth daily., Disp: , Rfl:    furosemide (LASIX) 40 MG tablet, Take 1 tablet (40 mg total) by mouth daily. (Patient taking differently: Take 20 mg by mouth daily.), Disp: 30 tablet, Rfl: 0   lisinopril (ZESTRIL) 2.5 MG tablet, Take 2.5 mg by mouth daily., Disp: , Rfl:    metFORMIN (GLUCOPHAGE) 1000 MG tablet, Take 1,000 mg by mouth 2 (two) times daily with a meal., Disp: , Rfl:    metoprolol succinate (TOPROL-XL) 25 MG 24 hr tablet, Take 1.5 tablets (37.5 mg total) by mouth daily. (Patient taking differently: Take 25 mg by mouth daily.), Disp: 60 tablet, Rfl: 0    omeprazole (PRILOSEC) 40 MG capsule, Take 40 mg by mouth daily., Disp: , Rfl:    tamsulosin (FLOMAX) 0.4 MG CAPS capsule, Take 0.8 mg by mouth daily., Disp: , Rfl:     MEDICATION ADHERENCES TIPS AND STRATEGIES Taking medication as prescribed improves patient outcomes in heart failure (reduces hospitalizations, improves symptoms, increases survival) Side effects of medications can be managed by decreasing doses, switching agents, stopping drugs, or adding additional therapy. Please let someone in the [facility] know if you have having bothersome side effects so we can modify your regimen. Do not alter your medication regimen without talking to [place].  Medication reminders can help patients remember to take drugs on time. If you are missing or forgetting doses you can try linking behaviors, using pill boxes, or an electronic reminder like an alarm on your phone or an app. Some people can also get automated phone calls as medication reminders.

## 2021-06-15 ENCOUNTER — Other Ambulatory Visit: Payer: Self-pay

## 2021-06-15 ENCOUNTER — Encounter: Payer: Self-pay | Admitting: Cardiology

## 2021-06-15 ENCOUNTER — Ambulatory Visit (INDEPENDENT_AMBULATORY_CARE_PROVIDER_SITE_OTHER): Payer: Medicare Other | Admitting: Cardiology

## 2021-06-15 ENCOUNTER — Encounter: Payer: Self-pay | Admitting: *Deleted

## 2021-06-15 ENCOUNTER — Other Ambulatory Visit
Admission: RE | Admit: 2021-06-15 | Discharge: 2021-06-15 | Disposition: A | Discharge status: Home / Self Care | Payer: Medicare Other | Attending: Cardiology | Admitting: Cardiology

## 2021-06-15 VITALS — BP 104/74 | HR 77 | Ht 64.0 in | Wt 197.0 lb

## 2021-06-15 DIAGNOSIS — I5022 Chronic systolic (congestive) heart failure: Secondary | ICD-10-CM

## 2021-06-15 DIAGNOSIS — Z01818 Encounter for other preprocedural examination: Secondary | ICD-10-CM

## 2021-06-15 DIAGNOSIS — I10 Essential (primary) hypertension: Secondary | ICD-10-CM

## 2021-06-15 DIAGNOSIS — I480 Paroxysmal atrial fibrillation: Secondary | ICD-10-CM | POA: Diagnosis not present

## 2021-06-15 LAB — BASIC METABOLIC PANEL
Anion gap: 8 (ref 5–15)
BUN: 21 mg/dL (ref 8–23)
CO2: 23 mmol/L (ref 22–32)
Calcium: 8.5 mg/dL — ABNORMAL LOW (ref 8.9–10.3)
Chloride: 107 mmol/L (ref 98–111)
Creatinine, Ser: 1.17 mg/dL (ref 0.61–1.24)
GFR, Estimated: >60 mL/min (ref >60)
Glucose, Bld: 154 mg/dL — ABNORMAL HIGH (ref 70–99)
Potassium: 3.9 mmol/L (ref 3.5–5.1)
Sodium: 138 mmol/L (ref 135–145)

## 2021-06-15 LAB — CBC WITH DIFFERENTIAL/PLATELET
Abs Immature Granulocytes: 0.01 10*3/uL (ref 0.00–0.07)
Basophils Absolute: 0 10*3/uL (ref 0.0–0.1)
Basophils Relative: 1 %
Eosinophils Absolute: 0.2 10*3/uL (ref 0.0–0.5)
Eosinophils Relative: 3 %
HCT: 41.5 % (ref 39.0–52.0)
Hemoglobin: 13.7 g/dL (ref 13.0–17.0)
Immature Granulocytes: 0 %
Lymphocytes Relative: 31 %
Lymphs Abs: 1.7 10*3/uL (ref 0.7–4.0)
MCH: 29.3 pg (ref 26.0–34.0)
MCHC: 33 g/dL (ref 30.0–36.0)
MCV: 88.9 fL (ref 80.0–100.0)
Monocytes Absolute: 0.4 10*3/uL (ref 0.1–1.0)
Monocytes Relative: 7 %
Neutro Abs: 3.2 10*3/uL (ref 1.7–7.7)
Neutrophils Relative %: 58 %
Platelets: 203 10*3/uL (ref 150–400)
RBC: 4.67 MIL/uL (ref 4.22–5.81)
RDW: 13.2 % (ref 11.5–15.5)
WBC: 5.5 10*3/uL (ref 4.0–10.5)
nRBC: 0 % (ref 0.0–0.2)

## 2021-06-15 NOTE — Progress Notes (Signed)
Electrophysiology Office Follow up Visit Note:    Date:  06/15/2021   ID:  Jermaine Kramer, DOB [birth year], MRN [id number]  PCP:  Sherre Scarlet, PA-C  [facility] Cardiologist:  None  [facility] Electrophysiologist:  Vickie Epley, MD    Interval History:    Jermaine Kramer is a 71 y.o. male who presents for a follow up visit. They were last seen May 18, 2021 when he was hospitalized at [facility].  When he presented to the hospital initially on January 14 he was significantly volume overloaded with decompensated heart failure.  He has a history of coronary artery disease.  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  He is on amiodarone 200 mg by mouth once daily and Eliquis twice daily for history of atrial fibrillation.  I evaluated the patient at [facility] given the patient's desire to have his ICD implanted in [place].  Given the patient was admitted with decompensated heart failure I advised waiting several weeks before implanting a ICD to allow him to become more euvolemic.  Since discharge the patient is feeling much better.  His weight has been stable.  His wife brings in a log of weights from home and they are all within 1 to 2 pounds of each other.  He is doing well with his medications.  No syncope or presyncope.     Past Medical History:  Diagnosis Date   Arrhythmia    atrial fibrillation   Cancer [facility])    leukemia   CHF (congestive heart failure) (HCC)    Coronary artery disease    Diabetes mellitus without complication (HCC)    GERD (gastroesophageal reflux disease)    Hypercholesteremia    Hypertension    Sleep apnea    Stroke [facility])     Past Surgical History:  Procedure Laterality Date   CHOLECYSTECTOMY     COLONOSCOPY WITH PROPOFOL N/A 04/14/2019   Procedure: COLONOSCOPY WITH PROPOFOL;  Surgeon: Toledo, Benay Pike, MD;  Location: [facility] ENDOSCOPY;  Service: Gastroenterology;   Laterality: N/A;   CORONARY ARTERY BYPASS GRAFT  02/16/2021   KNEE ARTHROSCOPY     RENAL CYST EXCISION      Current Medications: Current Meds  Medication Sig   amiodarone (PACERONE) 200 MG tablet Take 200 mg by mouth daily.   amLODipine (NORVASC) 5 MG tablet Take 5 mg by mouth daily.   apixaban (ELIQUIS) 5 MG TABS tablet Take 5 mg by mouth 2 (two) times daily.   aspirin EC 81 MG tablet Take 81 mg by mouth daily.   atorvastatin (LIPITOR) 80 MG tablet Take 80 mg by mouth daily.   furosemide (LASIX) 40 MG tablet Take 1 tablet (40 mg total) by mouth daily. (Patient taking differently: Take 20 mg by mouth daily.)   lisinopril (ZESTRIL) 2.5 MG tablet Take 1 tablet (2.5 mg total) by mouth daily.   metFORMIN (GLUCOPHAGE) 1000 MG tablet Take 1,000 mg by mouth 2 (two) times daily with a meal.   metoprolol succinate (TOPROL-XL) 25 MG 24 hr tablet Take 1.5 tablets (37.5 mg total) by mouth daily. (Patient taking differently: Take 25 mg by mouth daily.)   omeprazole (PRILOSEC) 40 MG capsule Take 40 mg by mouth daily.   tamsulosin (FLOMAX) 0.4 MG CAPS capsule Take 0.8 mg by mouth daily.     Allergies:   Doxycycline, Empagliflozin, and Heparin   Social History   Socioeconomic History   Marital status:  Married    Spouse name: Not on file   Number of children: Not on file   Years of education: Not on file   Highest education level: Not on file  Occupational History   Not on file  Tobacco Use   Smoking status: Never   Smokeless tobacco: Never  Vaping Use   Vaping Use: Never used  Substance and Sexual Activity   Alcohol use: Not Currently   Drug use: No   Sexual activity: Not on file  Other Topics Concern   Not on file  Social History Narrative   Not on file   Social Determinants of Health   Financial Resource Strain: Not on file  Food Insecurity: Not on file  Transportation Needs: Not on file  Physical Activity: Not on file  Stress: Not on file  Social Connections: Not on file      Family History: The patient's family history includes Heart disease in his father; Lupus in his mother; Parkinson's disease in his mother.  ROS:   Please see the history of present illness.    All other systems reviewed and are negative.  EKGs/Labs/Other Studies Reviewed:    The following studies were reviewed today:  Prior hospital records  EKG:  The ekg ordered today demonstrates sinus rhythm.  Right bundle branch block.  Left axis deviation.  Recent Labs: 04/23/2021: ALT 18 05/18/2021: Magnesium 2.2 05/27/2021: B Natriuretic Peptide 512.0 06/15/2021: BUN 21; Creatinine, Ser 1.17; Hemoglobin 13.7; Platelets 203; Potassium 3.9; Sodium 138  Recent Lipid Panel No results found for: CHOL, TRIG, HDL, CHOLHDL, VLDL, LDLCALC, LDLDIRECT  Physical Exam:    VS:  BP 104/74 (BP Location: Left Arm, Patient Position: Sitting, Cuff Size: Normal)    Pulse 77    Ht 5\' 4"  (1.626 m)    Wt 197 lb (89.4 kg)    SpO2 96%    BMI 33.81 kg/m     Wt Readings from Last 3 Encounters:  06/15/21 197 lb (89.4 kg)  06/02/21 186 lb (84.4 kg)  05/18/21 182 lb 6.4 oz (82.7 kg)     GEN:  Well nourished, well developed in no acute distress.  Obese HEENT: Normal NECK: No JVD; No carotid bruits LYMPHATICS: No lymphadenopathy CARDIAC: RRR, no murmurs, rubs, gallops RESPIRATORY:  Clear to auscultation without rales, wheezing or rhonchi  ABDOMEN: Soft, non-tender, non-distended MUSCULOSKELETAL:  No edema; No deformity  SKIN: Warm and dry NEUROLOGIC:  Alert and oriented x 3 PSYCHIATRIC:  Normal affect        ASSESSMENT:    1. Chronic systolic heart failure ([place])   2. Pre-op evaluation   3. AF (paroxysmal atrial fibrillation) ([place])   4. Primary hypertension    PLAN:    In order of problems listed above:  #Chronic systolic heart failure NYHA class II-III.  Warm and dry on exam.  He appears much better than when I saw him in the hospital.  His weights have been stable at home.  I have reinforced  the need to monitor p.o. fluid intake while on diuretic therapy.  I reinforced monitoring his weights daily.  He should continue on his Toprol, lisinopril, Lasix, amiodarone.  I do think he is a candidate now for ICD therapy.  He has no pacing indication.  He is a right bundle branch block that is about 150 ms.  I will plan for dual-chamber single coil ICD by [employer].  I discussed the procedure in detail during today's visit including the risks and recovery and he  wishes to proceed.  I will hold his Eliquis for 3 days prior to the implant.  He will likely restart it 5 days after the implant.  The patient has an ischemic CM (EF <20%), NYHA Class III CHF, and CAD.  He is referred by Dr Nehemiah Massed for risk stratification of sudden death and consideration of ICD implantation.  At this time, he meets criteria for ICD implantation for primary prevention of sudden death.  I have had a thorough discussion with the patient reviewing options.  The patient and their family (if available) have had opportunities to ask questions and have them answered. The patient and I have decided together through a shared decision making process to proceed with ICD implant at this time.    Risks, benefits, alternatives to ICD implantation were discussed in detail with the patient today. The patient understands that the risks include but are not limited to bleeding, infection, pneumothorax, perforation, tamponade, vascular damage, renal failure, MI, stroke, death, inappropriate shocks, and lead dislodgement and wishes to proceed.  We will therefore schedule device implantation at the next available time.  #Atrial fibrillation In sinus rhythm today.  Plan to hold anticoagulation for 3 days prior to implant restart 5 days after implant.  #Hypertension Controlled.  Borderline low blood pressures limit initiation of Entresto.  This was discussed with the patient and his family during today's visit.  Total time spent with patient  today 45 minutes. This includes reviewing records, evaluating the patient and coordinating care.   Medication Adjustments/Labs and Tests Ordered: Current medicines are reviewed at length with the patient today.  Concerns regarding medicines are outlined above.  Orders Placed This Encounter  Procedures   CBC w/Diff   Basic Metabolic Panel (BMET)   EKG 12-Lead   No orders of the defined types were placed in this encounter.    Signed, Lars Mage, MD, [facility], [facility] 06/15/2021 2:51 PM    Electrophysiology [facility]

## 2021-06-15 NOTE — H&P (View-Only) (Signed)
Electrophysiology Office Follow up Visit Note:    Date:  06/15/2021   ID:  Jermaine Kramer, DOB 1950/08/14, MRN [id number]  PCP:  Sherre Scarlet, PA-C  [facility] Cardiologist:  None  [facility] Electrophysiologist:  Vickie Epley, MD    Interval History:    Jermaine Kramer is a 71 y.o. male who presents for a follow up visit. They were last seen May 18, 2021 when he was hospitalized at [facility].  When he presented to the hospital initially on January 14 he was significantly volume overloaded with decompensated heart failure.  He has a history of coronary artery disease.  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  He is on amiodarone 200 mg by mouth once daily and Eliquis twice daily for history of atrial fibrillation.  I evaluated the patient at [facility] given the patient's desire to have his ICD implanted in [place].  Given the patient was admitted with decompensated heart failure I advised waiting several weeks before implanting a ICD to allow him to become more euvolemic.  Since discharge the patient is feeling much better.  His weight has been stable.  His wife brings in a log of weights from home and they are all within 1 to 2 pounds of each other.  He is doing well with his medications.  No syncope or presyncope.     Past Medical History:  Diagnosis Date   Arrhythmia    atrial fibrillation   Cancer [facility])    leukemia   CHF (congestive heart failure) (HCC)    Coronary artery disease    Diabetes mellitus without complication (HCC)    GERD (gastroesophageal reflux disease)    Hypercholesteremia    Hypertension    Sleep apnea    Stroke [facility])     Past Surgical History:  Procedure Laterality Date   CHOLECYSTECTOMY     COLONOSCOPY WITH PROPOFOL N/A 04/14/2019   Procedure: COLONOSCOPY WITH PROPOFOL;  Surgeon: Toledo, Benay Pike, MD;  Location: [facility] ENDOSCOPY;  Service: Gastroenterology;   Laterality: N/A;   CORONARY ARTERY BYPASS GRAFT  02/16/2021   KNEE ARTHROSCOPY     RENAL CYST EXCISION      Current Medications: Current Meds  Medication Sig   amiodarone (PACERONE) 200 MG tablet Take 200 mg by mouth daily.   amLODipine (NORVASC) 5 MG tablet Take 5 mg by mouth daily.   apixaban (ELIQUIS) 5 MG TABS tablet Take 5 mg by mouth 2 (two) times daily.   aspirin EC 81 MG tablet Take 81 mg by mouth daily.   atorvastatin (LIPITOR) 80 MG tablet Take 80 mg by mouth daily.   furosemide (LASIX) 40 MG tablet Take 1 tablet (40 mg total) by mouth daily. (Patient taking differently: Take 20 mg by mouth daily.)   lisinopril (ZESTRIL) 2.5 MG tablet Take 1 tablet (2.5 mg total) by mouth daily.   metFORMIN (GLUCOPHAGE) 1000 MG tablet Take 1,000 mg by mouth 2 (two) times daily with a meal.   metoprolol succinate (TOPROL-XL) 25 MG 24 hr tablet Take 1.5 tablets (37.5 mg total) by mouth daily. (Patient taking differently: Take 25 mg by mouth daily.)   omeprazole (PRILOSEC) 40 MG capsule Take 40 mg by mouth daily.   tamsulosin (FLOMAX) 0.4 MG CAPS capsule Take 0.8 mg by mouth daily.     Allergies:   Doxycycline, Empagliflozin, and Heparin   Social History   Socioeconomic History   Marital status:  Married    Spouse name: Not on file   Number of children: Not on file   Years of education: Not on file   Highest education level: Not on file  Occupational History   Not on file  Tobacco Use   Smoking status: Never   Smokeless tobacco: Never  Vaping Use   Vaping Use: Never used  Substance and Sexual Activity   Alcohol use: Not Currently   Drug use: No   Sexual activity: Not on file  Other Topics Concern   Not on file  Social History Narrative   Not on file   Social Determinants of Health   Financial Resource Strain: Not on file  Food Insecurity: Not on file  Transportation Needs: Not on file  Physical Activity: Not on file  Stress: Not on file  Social Connections: Not on file      Family History: The patient's family history includes Heart disease in his father; Lupus in his mother; Parkinson's disease in his mother.  ROS:   Please see the history of present illness.    All other systems reviewed and are negative.  EKGs/Labs/Other Studies Reviewed:    The following studies were reviewed today:  Prior hospital records  EKG:  The ekg ordered today demonstrates sinus rhythm.  Right bundle branch block.  Left axis deviation.  Recent Labs: 04/23/2021: ALT 18 05/18/2021: Magnesium 2.2 05/27/2021: B Natriuretic Peptide 512.0 06/15/2021: BUN 21; Creatinine, Ser 1.17; Hemoglobin 13.7; Platelets 203; Potassium 3.9; Sodium 138  Recent Lipid Panel No results found for: CHOL, TRIG, HDL, CHOLHDL, VLDL, LDLCALC, LDLDIRECT  Physical Exam:    VS:  BP 104/74 (BP Location: Left Arm, Patient Position: Sitting, Cuff Size: Normal)    Pulse 77    Ht 5\' 4"  (1.626 m)    Wt 197 lb (89.4 kg)    SpO2 96%    BMI 33.81 kg/m     Wt Readings from Last 3 Encounters:  06/15/21 197 lb (89.4 kg)  06/02/21 186 lb (84.4 kg)  05/18/21 182 lb 6.4 oz (82.7 kg)     GEN:  Well nourished, well developed in no acute distress.  Obese HEENT: Normal NECK: No JVD; No carotid bruits LYMPHATICS: No lymphadenopathy CARDIAC: RRR, no murmurs, rubs, gallops RESPIRATORY:  Clear to auscultation without rales, wheezing or rhonchi  ABDOMEN: Soft, non-tender, non-distended MUSCULOSKELETAL:  No edema; No deformity  SKIN: Warm and dry NEUROLOGIC:  Alert and oriented x 3 PSYCHIATRIC:  Normal affect        ASSESSMENT:    1. Chronic systolic heart failure ([place])   2. Pre-op evaluation   3. AF (paroxysmal atrial fibrillation) ([place])   4. Primary hypertension    PLAN:    In order of problems listed above:  #Chronic systolic heart failure NYHA class II-III.  Warm and dry on exam.  He appears much better than when I saw him in the hospital.  His weights have been stable at home.  I have reinforced  the need to monitor p.o. fluid intake while on diuretic therapy.  I reinforced monitoring his weights daily.  He should continue on his Toprol, lisinopril, Lasix, amiodarone.  I do think he is a candidate now for ICD therapy.  He has no pacing indication.  He is a right bundle branch block that is about 150 ms.  I will plan for dual-chamber single coil ICD by [employer].  I discussed the procedure in detail during today's visit including the risks and recovery and he  wishes to proceed.  I will hold his Eliquis for 3 days prior to the implant.  He will likely restart it 5 days after the implant.  The patient has an ischemic CM (EF <20%), NYHA Class III CHF, and CAD.  He is referred by Dr Nehemiah Massed for risk stratification of sudden death and consideration of ICD implantation.  At this time, he meets criteria for ICD implantation for primary prevention of sudden death.  I have had a thorough discussion with the patient reviewing options.  The patient and their family (if available) have had opportunities to ask questions and have them answered. The patient and I have decided together through a shared decision making process to proceed with ICD implant at this time.    Risks, benefits, alternatives to ICD implantation were discussed in detail with the patient today. The patient understands that the risks include but are not limited to bleeding, infection, pneumothorax, perforation, tamponade, vascular damage, renal failure, MI, stroke, death, inappropriate shocks, and lead dislodgement and wishes to proceed.  We will therefore schedule device implantation at the next available time.  #Atrial fibrillation In sinus rhythm today.  Plan to hold anticoagulation for 3 days prior to implant restart 5 days after implant.  #Hypertension Controlled.  Borderline low blood pressures limit initiation of Entresto.  This was discussed with the patient and his family during today's visit.  Total time spent with patient  today 45 minutes. This includes reviewing records, evaluating the patient and coordinating care.   Medication Adjustments/Labs and Tests Ordered: Current medicines are reviewed at length with the patient today.  Concerns regarding medicines are outlined above.  Orders Placed This Encounter  Procedures   CBC w/Diff   Basic Metabolic Panel (BMET)   EKG 12-Lead   No orders of the defined types were placed in this encounter.    Signed, Lars Mage, MD, [facility], [facility] 06/15/2021 2:51 PM    Electrophysiology [facility]

## 2021-06-15 NOTE — Patient Instructions (Addendum)
Medications: Your physician recommends that you continue on your current medications as directed. Please refer to the Current Medication list given to you today. *If you need a refill on your cardiac medications before your next appointment, please call your pharmacy*  Lab Work: CBC, BMP  If you have labs (blood work) drawn today and your tests are completely normal, you will receive your results only by: [place] (if you have MyChart) OR A paper copy in the mail If you have any lab test that is abnormal or we need to change your treatment, we will call you to review the results.  Testing/Procedures: Your physician has recommended that you have a defibrillator inserted. An implantable cardioverter defibrillator (ICD) is a small device that is placed in your chest or, in rare cases, your abdomen. This device uses electrical pulses or shocks to help control life-threatening, irregular heartbeats that could lead the heart to suddenly stop beating (sudden cardiac arrest). Leads are attached to the ICD that goes into your heart. This is done in the hospital and usually requires an overnight stay. Please see the instruction sheet given to you today for more information.   Follow-Up: At [facility], you and your health needs are our priority.  As part of our continuing mission to provide you with exceptional heart care, we have created designated Provider Care Teams.  These Care Teams include your primary Cardiologist (physician) and Advanced Practice Providers (APPs -  Physician Assistants and Nurse Practitioners) who all work together to provide you with the care you need, when you need it.  Your physician wants you to follow-up in: (procedure Feb 22)  You will follow up with the [place] clinic 10-14 days after your procedure - You will follow up with Dr. Quentin Ore 91 days after your procedure.  We recommend signing up for the patient portal called "MyChart".  Sign up information is  provided on this After Visit Summary.  MyChart is used to connect with patients for Virtual Visits (Telemedicine).  Patients are able to view lab/test results, encounter notes, upcoming appointments, etc.  Non-urgent messages can be sent to your provider as well.   To learn more about what you can do with MyChart, go to [email or web address].    Any Other Special Instructions Will Be Listed Below (If Applicable).   Cardioverter Defibrillator Implantation An implantable cardioverter defibrillator (ICD) is a device that identifies and corrects abnormal heart rhythms. Cardioverter defibrillator implantation is a surgery to place an ICD under the skin in the chest or abdomen. An ICD has a battery, a small computer (pulse generator), and wires (leads) that go into the heart. The ICD detects and corrects two types of dangerous irregular heart rhythms (arrhythmias): A rapid heart rhythm in the lower chambers of the heart (ventricles). This is called ventricular tachycardia. The ventricles contracting in an uncoordinated way. This is called ventricular fibrillation. There are different types of ICDs, and the electrical signals from the ICD can be programmed differently based on the condition being treated. The electrical signals from the ICD can be low-energy pulses, high-energy shocks, or a combination of the two. The low-energy pulses are generally used to restore the heartbeat to normal when it is either too slow (bradycardia) or too fast. These pulses are painless. The high-energy shocks are used to treat abnormal rhythms such as ventricular tachycardia or ventricular fibrillation. This shock may feel like a strong jolt in the chest. Your health care provider may recommend an ICD if you have:  Had a ventricular arrhythmia in the past. A damaged heart because of a disease or heart condition. A weakened heart muscle from a heart attack or cardiac arrest. A congenital heart defect. Long QT syndrome, which  is a disorder of the heart's electrical system. Brugada syndrome, which is a condition that causes a disruption of the heart's normal rhythm. Tell a health care provider about: Any allergies you have. All medicines you are taking, including vitamins, herbs, eye drops, creams, and over-the-counter medicines. Any problems you or family members have had with anesthetic medicines. Any blood disorders you have. Any surgeries you have had. Any medical conditions you have. Whether you are pregnant or may be pregnant. What are the risks? Generally, this is a safe procedure. However, problems may occur, including: Infection. Bleeding. Allergic reactions to medicines used during the procedure. Blood clots. Swelling or bruising. Damage to nearby structures or organs, such as nerves, lungs, blood vessels, or the heart where the ICD leads or pulse generator is implanted. What happens before the procedure? Staying hydrated Follow instructions from your health care provider about hydration, which may include: Up to 2 hours before the procedure - you may continue to drink clear liquids, such as water, clear fruit juice, black coffee, and plain tea.  Eating and drinking restrictions Follow instructions from your health care provider about eating and drinking, which may include: 8 hours before the procedure - stop eating heavy meals or foods, such as meat, fried foods, or fatty foods. 6 hours before the procedure - stop eating light meals or foods, such as toast or cereal. 6 hours before the procedure - stop drinking milk or drinks that contain milk. 2 hours before the procedure - stop drinking clear liquids. Medicines Ask your health care provider about: Changing or stopping your regular medicines. This is especially important if you are taking diabetes medicines or blood thinners. Taking medicines such as aspirin and ibuprofen. These medicines can thin your blood. Do not take these medicines unless  your health care provider tells you to take them. Taking over-the-counter medicines, vitamins, herbs, and supplements. Tests You may have an exam or testing. These may include: Blood tests. A test to check the electrical signals in your heart (electrocardiogram, ECG). Imaging tests, such as a chest X-ray. Echocardiogram. This is an ultrasound of your heart to evaluate your heart structures and function. An event monitor or Holter monitor to wear at home. General instructions Do not use any products that contain nicotine or tobacco for at least 4 weeks before the procedure. These products include cigarettes, chewing tobacco, and vaping devices, such as e-cigarettes. If you need help quitting, ask your health care provider. Ask your health care provider: How your procedure site will be marked. What steps will be taken to help prevent infection. These may include: Removing hair at the surgery site. Washing skin with a germ-killing soap. Taking antibiotic medicine. You may be asked to shower with a germ-killing soap. Plan to have a responsible adult take you home from the hospital or clinic. What happens during the procedure?  Small monitors will be put on your body. They will be used to check your heart rate, blood pressure, and oxygen level. A pair of sticky pads (defibrillator pads) may be placed on your back and chest. These pads are able to pace your heart as needed during the procedure. An IV will be inserted into one of your veins. You will be given one or more of the following: A medicine to  help you relax (sedative). A medicine to numb the area (local anesthetic). A medicine to make you fall asleep(general anesthetic). A small incision will be made to create a deep pocket under the skin of your chest or abdomen. Leads will be guided through a blood vessel into your heart and attached to your heart muscles. Depending on the ICD, the leads may go into one ventricle, or they may go  into both ventricles and into an upper chamber of the heart. An X-ray machine (fluoroscope) will be used to help guide the leads. The other end of the leads will be attached to the pulse generator. The pulse generator will be placed into the pocket under the skin. The ICD will be tested, and your health care provider will program the ICD for the condition being treated. The incision will be closed with stitches (sutures), skin glue, adhesive strips, or staples. A bandage (dressing) will be placed over the incision. The procedure may vary among health care providers and hospitals. What happens after the procedure? Your blood pressure, heart rate, breathing rate, and blood oxygen level will be monitored until you leave the hospital or clinic. Your health care provider will also monitor your ICD to make sure it is working properly. A chest X-ray will be taken to check that the ICD is in the right place. Do not raise the arm on the side of your procedure higher than your shoulder for as long as told by your health care provider. This is usually at least 6 weeks. You may be given an identification card explaining that you have an ICD. You will be given a remote home monitoring device to use with your ICD to allow your device to communicate with your clinic. Summary An implantable cardioverter defibrillator (ICD) is a device that identifies and corrects abnormal heart rhythms. Cardioverter defibrillator implantation is a surgery to place an ICD under the skin in the chest or abdomen. An ICD consists of a battery, a small computer (pulse generator), and wires (leads) that go into the heart. During the procedure, the ICD will be tested, and your health care provider will program the ICD for the condition being treated. After the procedure, a chest X-ray will be taken to check that the ICD is in the right place. This information is not intended to replace advice given to you by your health care provider.  Make sure you discuss any questions you have with your health care provider. Document Revised: 10/15/2019 Document Reviewed: 10/15/2019 Elsevier Patient Education  [place].

## 2021-06-22 ENCOUNTER — Encounter
Admission: RE | Disposition: A | Discharge status: Home / Self Care | Payer: Self-pay | Source: Home / Self Care | Attending: Cardiology

## 2021-06-22 ENCOUNTER — Other Ambulatory Visit: Payer: Self-pay

## 2021-06-22 ENCOUNTER — Encounter: Payer: Self-pay | Admitting: Cardiology

## 2021-06-22 ENCOUNTER — Ambulatory Visit: Payer: Medicare Other

## 2021-06-22 ENCOUNTER — Ambulatory Visit
Admission: RE | Admit: 2021-06-22 | Discharge: 2021-06-22 | Disposition: A | Discharge status: Home / Self Care | Payer: Medicare Other | Attending: Cardiology | Admitting: Cardiology

## 2021-06-22 DIAGNOSIS — E119 Type 2 diabetes mellitus without complications: Secondary | ICD-10-CM | POA: Insufficient documentation

## 2021-06-22 DIAGNOSIS — I5022 Chronic systolic (congestive) heart failure: Secondary | ICD-10-CM | POA: Diagnosis not present

## 2021-06-22 DIAGNOSIS — I480 Paroxysmal atrial fibrillation: Secondary | ICD-10-CM | POA: Diagnosis not present

## 2021-06-22 DIAGNOSIS — I255 Ischemic cardiomyopathy: Secondary | ICD-10-CM | POA: Diagnosis present

## 2021-06-22 DIAGNOSIS — Z79899 Other long term (current) drug therapy: Secondary | ICD-10-CM | POA: Diagnosis not present

## 2021-06-22 DIAGNOSIS — Z7984 Long term (current) use of oral hypoglycemic drugs: Secondary | ICD-10-CM | POA: Diagnosis not present

## 2021-06-22 DIAGNOSIS — Z95810 Presence of automatic (implantable) cardiac defibrillator: Secondary | ICD-10-CM

## 2021-06-22 DIAGNOSIS — Z8673 Personal history of transient ischemic attack (TIA), and cerebral infarction without residual deficits: Secondary | ICD-10-CM | POA: Insufficient documentation

## 2021-06-22 DIAGNOSIS — E785 Hyperlipidemia, unspecified: Secondary | ICD-10-CM

## 2021-06-22 DIAGNOSIS — E1169 Type 2 diabetes mellitus with other specified complication: Secondary | ICD-10-CM | POA: Diagnosis present

## 2021-06-22 DIAGNOSIS — G4733 Obstructive sleep apnea (adult) (pediatric): Secondary | ICD-10-CM | POA: Diagnosis not present

## 2021-06-22 DIAGNOSIS — Z7901 Long term (current) use of anticoagulants: Secondary | ICD-10-CM | POA: Insufficient documentation

## 2021-06-22 DIAGNOSIS — E118 Type 2 diabetes mellitus with unspecified complications: Secondary | ICD-10-CM | POA: Diagnosis present

## 2021-06-22 DIAGNOSIS — I2510 Atherosclerotic heart disease of native coronary artery without angina pectoris: Secondary | ICD-10-CM | POA: Diagnosis not present

## 2021-06-22 DIAGNOSIS — I110 Hypertensive heart disease with heart failure: Secondary | ICD-10-CM | POA: Insufficient documentation

## 2021-06-22 DIAGNOSIS — I429 Cardiomyopathy, unspecified: Secondary | ICD-10-CM

## 2021-06-22 DIAGNOSIS — Z951 Presence of aortocoronary bypass graft: Secondary | ICD-10-CM | POA: Diagnosis not present

## 2021-06-22 DIAGNOSIS — I420 Dilated cardiomyopathy: Secondary | ICD-10-CM | POA: Diagnosis present

## 2021-06-22 DIAGNOSIS — I10 Essential (primary) hypertension: Secondary | ICD-10-CM | POA: Diagnosis present

## 2021-06-22 DIAGNOSIS — I4891 Unspecified atrial fibrillation: Secondary | ICD-10-CM

## 2021-06-22 DIAGNOSIS — I9789 Other postprocedural complications and disorders of the circulatory system, not elsewhere classified: Secondary | ICD-10-CM

## 2021-06-22 DIAGNOSIS — I5020 Unspecified systolic (congestive) heart failure: Secondary | ICD-10-CM | POA: Diagnosis present

## 2021-06-22 HISTORY — PX: ICD IMPLANT: EP1208

## 2021-06-22 LAB — GLUCOSE, CAPILLARY: Glucose-Capillary: 123 mg/dL — ABNORMAL HIGH (ref 70–99)

## 2021-06-22 SURGERY — ICD IMPLANT
Anesthesia: Moderate Sedation

## 2021-06-22 MED ORDER — ACETAMINOPHEN 325 MG PO TABS: 325.0000 mg | ORAL_TABLET | ORAL | Status: DC | PRN

## 2021-06-22 MED ORDER — FENTANYL CITRATE (PF) 100 MCG/2ML IJ SOLN
INTRAMUSCULAR | Status: AC
  Filled 2021-06-22: qty 2

## 2021-06-22 MED ORDER — LIDOCAINE HCL 1 % IJ SOLN
INTRAMUSCULAR | Status: AC
  Filled 2021-06-22: qty 20

## 2021-06-22 MED ORDER — CEFAZOLIN SODIUM-DEXTROSE 2-4 GM/100ML-% IV SOLN
INTRAVENOUS | Status: AC
  Filled 2021-06-22: qty 100

## 2021-06-22 MED ORDER — POVIDONE-IODINE 10 % EX SWAB
2.0000 | Freq: Once | CUTANEOUS | Status: DC
Start: 2021-06-22 — End: 2021-06-22

## 2021-06-22 MED ORDER — MIDAZOLAM HCL 2 MG/2ML IJ SOLN
INTRAMUSCULAR | Status: AC
  Filled 2021-06-22: qty 2

## 2021-06-22 MED ORDER — ONDANSETRON HCL 4 MG/2ML IJ SOLN: 4.0000 mg | Freq: Four times a day (QID) | INTRAMUSCULAR | Status: DC | PRN

## 2021-06-22 MED ORDER — FENTANYL CITRATE (PF) 100 MCG/2ML IJ SOLN
INTRAMUSCULAR | Status: DC | PRN
  Administered 2021-06-22 (×2): 25 ug via INTRAVENOUS

## 2021-06-22 MED ORDER — CEFAZOLIN SODIUM-DEXTROSE 2-4 GM/100ML-% IV SOLN
2.0000 g | INTRAVENOUS | Status: AC
  Administered 2021-06-22: 2 g via INTRAVENOUS

## 2021-06-22 MED ORDER — CHLORHEXIDINE GLUCONATE 4 % EX LIQD: 4.0000 "application " | Freq: Once | CUTANEOUS | Status: DC

## 2021-06-22 MED ORDER — SODIUM CHLORIDE 0.9 % IV SOLN: INTRAVENOUS | Status: DC

## 2021-06-22 MED ORDER — SODIUM CHLORIDE 0.9 % IV SOLN
80.0000 mg | INTRAVENOUS | Status: DC
  Filled 2021-06-22: qty 2

## 2021-06-22 MED ORDER — MIDAZOLAM HCL 2 MG/2ML IJ SOLN
INTRAMUSCULAR | Status: DC | PRN
  Administered 2021-06-22 (×2): 1 mg via INTRAVENOUS

## 2021-06-22 MED ORDER — CHLORHEXIDINE GLUCONATE CLOTH 2 % EX PADS
6.0000 | MEDICATED_PAD | Freq: Every day | CUTANEOUS | Status: DC
  Administered 2021-06-22: 6 via TOPICAL

## 2021-06-22 MED ORDER — LIDOCAINE HCL (PF) 1 % IJ SOLN
INTRAMUSCULAR | Status: DC | PRN
  Administered 2021-06-22: 40 mL

## 2021-06-22 MED ORDER — APIXABAN 5 MG PO TABS: 5.0000 mg | ORAL_TABLET | Freq: Two times a day (BID) | ORAL | Status: DC

## 2021-06-22 SURGICAL SUPPLY — 23 items
CABLE SURG 12 DISP A/V CHANNEL (MISCELLANEOUS) ×1 IMPLANT
DEVICE DSSCT PLSMBLD 3.0S LGHT (MISCELLANEOUS) IMPLANT
ELECT REM PT RETURN 9FT ADLT (ELECTROSURGICAL) ×2
ELECTRODE REM PT RTRN 9FT ADLT (ELECTROSURGICAL) IMPLANT
ICD EVERA DR XT MRI DDMB1D4 (ICD Generator) ×1 IMPLANT
INTRO PACEMAKR LEAD 9FR 13CM (INTRODUCER) ×2
INTRO PACEMKR SHEATH II 7FR (MISCELLANEOUS) ×2
INTRODUCER PACEMKR LD 9FR 13CM (INTRODUCER) IMPLANT
INTRODUCER PACEMKR SHTH II 7FR (MISCELLANEOUS) IMPLANT
LEAD CAPSURE NOVUS 45CM (Lead) ×1 IMPLANT
LEAD SPRINT QUAT SEC 6935M-55 (Lead) ×1 IMPLANT
PAD ELECT DEFIB RADIOL ZOLL (MISCELLANEOUS) ×1 IMPLANT
PLASMABLADE 3.0S W/LIGHT (MISCELLANEOUS) ×2
POUCH AIGIS-R ANTIBACT ICD (Mesh General) ×2 IMPLANT
POUCH AIGIS-R ANTIBACT ICD LRG (Mesh General) IMPLANT
SLING ARM IMMOBILIZER XL (CAST SUPPLIES) ×1 IMPLANT
SUT ETHIBOND CT1 BRD #0 30IN (SUTURE) ×1 IMPLANT
SUT PROLENE 0 CT 1 30 (SUTURE) ×1 IMPLANT
SUT VIC AB 2-0 CT1 27 (SUTURE) ×2
SUT VIC AB 2-0 CT1 TAPERPNT 27 (SUTURE) IMPLANT
SUT VIC AB 3-0 CT1 27 (SUTURE) ×2
SUT VIC AB 3-0 CT1 TAPERPNT 27 (SUTURE) IMPLANT
TRAY PACEMAKER INSERTION (PACKS) ×3 IMPLANT

## 2021-06-22 NOTE — Discharge Summary (Signed)
Discharge Summary    Patient ID: Jermaine Kramer  MRN: [id number], DOB/AGE: 05-Jan-1951 71 y.o.  Admit Date: 06/22/2021 Discharge Date: 06/22/2021  Primary Care Provider: Sherre Scarlet, PA-C Primary Cardiologist: Dr. Corky Sox, MD Primary Electrophysiologist: Dr. Quentin Ore, MD  Discharge Diagnoses    Principal Problem:   HFrEF (heart failure with reduced ejection fraction) [facility]) Active Problems:   Type 2 diabetes mellitus with complication, without long-term current use of insulin (HCC)   Hypertension   CAD (coronary artery disease)   Postoperative atrial fibrillation (HCC)   Ischemic cardiomyopathy   S/P CABG x 3   Hyperlipidemia LDL goal <70   Allergies Allergies  Allergen Reactions   Doxycycline Anaphylaxis    Patient does not recall having this reaction.     Empagliflozin Itching    Groin infection Caused a groin infection.     Heparin Other (See Comments)    Unknown      History of Present Illness     71 year old male with history of CAD status post three-vessel CABG on 02/16/2021 with LIMA to LAD, SVG to ramus, and SVG to PDA, postoperative A-fib on amiodarone and apixaban, HFrEF secondary to ICM, frequent PVCs, DM2, TIA, AML, Covid in 03/2021 requiring hospital admission, penile cancer, HTN, OSA not on CPAP, and GERD who presented to [facility] on 06/22/2021 for planned ICD implantation.   He established care with [place] in 11/2020 following an admission for pneumonia with frequent PVCs noted.  Subsequent echo in 11/2020 demonstrated an EF of 20%, diffuse hypokinesis, normal RV systolic function and ventricular cavity size, and no significant valvular abnormalities.  Frequent PVCs were noted throughout the exam.  Subsequent outpatient cardiac monitoring demonstrated an average rate of 81 bpm with frequent PVCs totaling 33,466 and 24 hours including a 4 beat run of NSVT.  Cardiac MRI on 01/27/2021 demonstrated an LVEF of 30%.  LHC on 02/10/2021 demonstrated two-vessel  CAD including 100% mid RCA stenosis, 70% mid LAD stenosis, and focal 80% distal LAD stenosis along with 70% stenosis in D1.  LVEDP was elevated at 20 mmHg.  He was subsequently admitted to [facility] from 10/17 through 03/06/2021 and underwent three-vessel CABG by Dr. Junius Argyle on 02/16/2021.  Postoperative course was notable for AMS, hypoxia, HIT, parotitis with dysphagia, and postoperative A-fib requiring amiodarone and apixaban.  Postoperative TEE demonstrated an LVEF of 35 to 40%.  He was discharged wearing a LifeVest.   He was admitted in 03/2021 with acute respiratory failure with hypoxia in the setting of Covid infection.  He was readmitted from 1/14 through 05/18/2021 with a CHF exacerbation.  He had symptomatic improvement with IV diuresis.  Echo during that admission demonstrated an LVEF of less than 20%.  During that admission, he was evaluated by EP for evaluation of ICD.  It was recommended the patient be optimized from a CHF perspective and follow-up with EP thereafter.  With regards to his frequent PVCs, medical therapy was recommended over an invasive approach.  For general cardiology, he has since established with Dr. Corky Sox, with [facility].  He was evaluated by Dr. Quentin Ore in our office on 06/15/2021 with NYHA class II-III symptoms; with recommendation to proceed with a dual chamber ICD for primary prevention.   Hospital Course     Consultants: None  He presented to [facility] on 06/22/2021 and underwent successful implantation of Medtronic DDD (model Evera MRI XT DR, serial [id number]) for primary prevention.  Postprocedure course was uncomplicated including stable postoperative chest x-ray.  Post device implantation instructions were discussed in detail.  He has been evaluated by Dr. Quentin Ore and felt to be safe for discharge.  He will follow-up with EP as directed and with his primary cardiologist at [facility] as previously instructed.     Multivessel CAD status post  CABG without angina:  -No symptoms suggestive of angina or decompensation   -PTA ASA, atorvastatin, metoprolol succinate, and lisinopril  -Aggressive risk factor modification and secondary prevention  -Follow-up with primary cardiologist   HFrEF secondary to ICM s/p Medtronic DDD ICD:  -Continue PTA lisinopril and Toprol-XL  -Encourage escalation and optimization of GDMT as vitals and labs allow and follow-up with primary cardiology team  -Relative hypotension precludes escalation of GDMT at this time -Status post ICD 06/22/2021 -Compensated  -Post device implantation instructions  -Follow-up with primary cardiologist and EP as directed  -CHF education   Postoperative A-fib:  -Maintaining sinus rhythm  -CHADS2VASc 7 (CHF, HTN, age x1, DM, TIA x 2, vascular disease)  -Eliquis to be resumed 06/27/2021 -PTA metoprolol succinate, and amiodarone  -Follow-up with primary cardiologist as directed  Frequent PVCs:  -Occasional isolated PVCs noted on tele, asymptomatic   -PTA amiodarone Toprol succinate  -Potassium 3.9, magnesium 2.2  -Follow up with primary cardiologist   HTN:  -Blood pressure stable  -Continue medical therapy as outlined above  -Follow up with PCP  HLD:  -LDL 57 in 08/2020  -PTA atorvastatin 80 mg  -Follow-up with primary cardiologist   DM2:  -A1c 7.8  -PTA metformin  -Follow up with PCP    The patient has been seen by Dr. Quentin Ore and felt to be stable for discharge today. All follow up appointments have been made. Discharge medications are listed below.  No new prescriptions were added.  _____________  Discharge Vitals Blood pressure 118/81, pulse 74, temperature 98 F (36.7 C), temperature source Oral, resp. rate 15, height 5\' 6"  (1.676 m), weight 84.8 kg, SpO2 94 %.  Filed Weights   06/22/21 0654  Weight: 84.8 kg   Physical Exam   GEN: No acute distress.   Neck: No JVD. Cardiac: RRR, no murmurs, rubs, or gallops. Clean, dry, intact dressing in place  without surrounding erythema or STS.  Respiratory: Clear to auscultation bilaterally.  GI: Soft, nontender, non-distended.   MS: No edema; No deformity. Left arm in sling.  Neuro:  Alert and oriented x 3; Nonfocal.  Psych: Normal affect.  Labs & Radiologic Studies    CBC No results for input(s): WBC, NEUTROABS, HGB, HCT, MCV, PLT in the last 72 hours. Basic Metabolic Panel No results for input(s): NA, K, CL, CO2, GLUCOSE, BUN, CREATININE, CALCIUM, MG, PHOS in the last 72 hours. Liver Function Tests No results for input(s): AST, ALT, ALKPHOS, BILITOT, PROT, ALBUMIN in the last 72 hours. No results for input(s): LIPASE, AMYLASE in the last 72 hours. Cardiac Enzymes No results for input(s): CKTOTAL, CKMB, CKMBINDEX, TROPONINI in the last 72 hours. BNP Invalid input(s): POCBNP D-Dimer No results for input(s): DDIMER in the last 72 hours. Hemoglobin A1C No results for input(s): HGBA1C in the last 72 hours. Fasting Lipid Panel No results for input(s): CHOL, HDL, LDLCALC, TRIG, CHOLHDL, LDLDIRECT in the last 72 hours. Thyroid Function Tests No results for input(s): TSH, T4TOTAL, T3FREE, THYROIDAB in the last 72 hours.  Invalid input(s): FREET3 _____________  DG Chest 2 View  Result Date: 06/22/2021 CLINICAL DATA:  Patient with history of defibrillator. EXAM: CHEST - 2 VIEW COMPARISON:  Chest radiograph 05/14/2021.  FINDINGS: Stable cardiomegaly. Multi lead AICD device overlies the left hemithorax. Minimal basilar atelectasis. No large area pulmonary consolidation. No pleural effusion or pneumothorax. Thoracic spine degenerative changes. IMPRESSION: No acute cardiopulmonary process. Electronically Signed   By: Lovey Newcomer M.D.   On: June 23, 2021 13:12   EP PPM/ICD IMPLANT  Result Date: June 23, 2021  CONCLUSIONS:  1. Ischemic cardiomyopathy with chronic New York Heart Association class III heart failure.  2. Successful ICD implantation with a Medtronic DDD ICD implanted for prevention of  sudden death.  3.  No early apparent complications.  4. Hold apixaban for 5 days (Restart 06/27/2021)    Diagnostic Studies/Procedures   ICD implant 2021/06/23: CONCLUSIONS:   1. Ischemic cardiomyopathy with chronic New York Heart Association class III heart failure.   2. Successful ICD implantation with a Medtronic DDD ICD implanted for prevention of sudden death.   3.  No early apparent complications.   4. Hold apixaban for 5 days (Restart 06/27/2021) _____________  Disposition   Pt is being discharged home today in good condition.  Follow-up Plans & Appointments     Follow-up Information     [facility] Follow up on 07/06/2021.   Why: Wound check Appointment time 8:40 AM Contact information: [place] [address] [phone]        Vickie Epley, MD Follow up on 09/21/2021.   Specialties: Cardiology, Radiology Why: Appointment time 9:20 AM Contact information: [address] [place] [address] [phone]                Discharge Instructions     Diet - low sodium heart healthy   Complete by: As directed    Increase activity slowly   Complete by: As directed        Discharge Medications   Allergies as of 2021-06-23       Reactions   Doxycycline Anaphylaxis   Patient does not recall having this reaction.    Empagliflozin Itching   Groin infection Caused a groin infection.    Heparin Other (See Comments)   Unknown        Medication List     TAKE these medications    amiodarone 200 MG tablet Commonly known as: PACERONE Take 200 mg by mouth daily.   apixaban 5 MG Tabs tablet Commonly known as: ELIQUIS Take 1 tablet (5 mg total) by mouth 2 (two) times daily. DO NOT RESTART UNTIL 06/27/2021. What changed: additional instructions   aspirin EC 81 MG tablet Take 81 mg by mouth daily.   atorvastatin 80 MG tablet Commonly known as:  LIPITOR Take 80 mg by mouth daily.   furosemide 40 MG tablet Commonly known as: LASIX Take 1 tablet (40 mg total) by mouth daily. What changed: how much to take   lisinopril 2.5 MG tablet Commonly known as: ZESTRIL Take 1 tablet (2.5 mg total) by mouth daily.   metFORMIN 1000 MG tablet Commonly known as: GLUCOPHAGE Take 1,000 mg by mouth 2 (two) times daily with a meal.   metoprolol succinate 25 MG 24 hr tablet Commonly known as: TOPROL-XL Take 1.5 tablets (37.5 mg total) by mouth daily. What changed: how much to take   omeprazole 40 MG capsule Commonly known as: PRILOSEC Take 40 mg by mouth daily.   tamsulosin 0.4 MG Caps capsule Commonly known as: FLOMAX Take 0.8 mg by mouth daily.         Aspirin prescribed at discharge?  Yes High Intensity Statin Prescribed? (Lipitor 40-80mg  or Crestor 20-40mg ): Yes Beta Blocker Prescribed? Yes For EF <40%, was ACEI/ARB Prescribed? Yes ADP Receptor Inhibitor Prescribed? (i.e. Plavix etc.-Includes Medically Managed Patients): No: not indicated  For EF <40%, Aldosterone Inhibitor Prescribed? No: relative hypotension, defer to primary cardiology group Was EF assessed during THIS hospitalization? No: not indicated Was Cardiac Rehab II ordered? (Included Medically managed Patients): No: not indicated   Outstanding Labs/Studies   None.  Duration of Discharge Encounter   Greater than 30 minutes including physician time.  Signed, Rise Mu, PA-C [facility] Pager: [phone] 06/22/2021, 1:40 PM

## 2021-06-22 NOTE — Discharge Instructions (Addendum)
After Your ICD (Implantable Cardiac Defibrillator)   You have dual chamber Medtronic ICD  ACTIVITY Do not lift your arm above shoulder height for 1 week after your procedure. After 7 days, you may progress as below.  You should remove your sling 24 hours after your procedure, unless otherwise instructed by your provider.     Wednesday June 29, 2021  Thursday June 30, 2021 Friday July 01, 2021 Saturday July 02, 2021   Do not lift, push, pull, or carry anything over 10 pounds with the affected arm until 6 weeks (Wednesday August 03, 2021 ) after your procedure.   You may drive AFTER your wound check, unless you have been told otherwise by your provider.   Ask your healthcare provider when you can go back to work   INCISION/Dressing If you are on a blood thinner such as Coumadin, Xarelto, Eliquis, Plavix, or Pradaxa please confirm with your provider when this should be resumed. Resume Eliquis on 5 days after your procedure (06/27/2021).  If large square, outer bandage is left in place, this can be removed after 24 hours from your procedure. Do not remove steri-strips or glue as below.   Monitor your defibrillator site for redness, swelling, and drainage. Call the device clinic at [phone] if you experience these symptoms or fever/chills.  If your incision is sealed with Steri-strips or staples, you may shower 10 days after your procedure or when told by your provider. Do not remove the steri-strips or let the shower hit directly on your site. You may wash around your site with soap and water.    If you were discharged in a sling, please do not wear this during the day more than 48 hours after your surgery unless otherwise instructed. This may increase the risk of stiffness and soreness in your shoulder.   Avoid lotions, ointments, or perfumes over your incision until it is well-healed.  You may use a hot tub or a pool AFTER your wound check appointment if the incision is  completely closed.  Your ICD is designed to protect you from life threatening heart rhythms. Because of this, you may receive a shock.   1 shock with no symptoms:  Call the office during business hours. 1 shock with symptoms (chest pain, chest pressure, dizziness, lightheadedness, shortness of breath, overall feeling unwell):  Call 911. If you experience 2 or more shocks in 24 hours:  Call 911. If you receive a shock, you should not drive for 6 months per the [place] DMV IF you receive appropriate therapy from your ICD.   ICD Alerts:  Some alerts are vibratory and others beep. These are NOT emergencies. Please call our office to let us know. If this occurs at night or on weekends, it can wait until the next business day. Send a remote transmission.  If your device is capable of reading fluid status (for heart failure), you will be offered monthly monitoring to review this with you.   DEVICE MANAGEMENT Remote monitoring is used to monitor your ICD from home. This monitoring is scheduled every 91 days by our office. It allows [place] to keep an eye on the functioning of your device to ensure it is working properly. You will routinely see your Electrophysiologist annually (more often if necessary).   You should receive your ID card for your new device in 4-8 weeks. Keep this card with you at all times once received. Consider wearing a medical alert bracelet or necklace.  Your ICD  may be MRI compatible. This will be discussed at your next office visit/wound check.  You should avoid contact with strong electric or magnetic fields.   Do not use amateur (ham) radio equipment or electric (arc) welding torches. MP3 player headphones with magnets should not be used. Some devices are safe to use if held at least 12 inches (30 cm) from your defibrillator. These include power tools, lawn mowers, and speakers. If you are unsure if something is safe to use, ask your health care provider.  When using your cell phone,  hold it to the ear that is on the opposite side from the defibrillator. Do not leave your cell phone in a pocket over the defibrillator.  You may safely use electric blankets, heating pads, computers, and microwave ovens.  Call the office right away if: You have chest pain. You feel more than one shock. You feel more short of breath than you have felt before. You feel more light-headed than you have felt before. Your incision starts to open up.  This information is not intended to replace advice given to you by your health care provider. Make sure you discuss any questions you have with your health care provider.

## 2021-06-22 NOTE — Interval H&P Note (Signed)
History and Physical Interval Note:  06/22/2021 7:02 AM  Jermaine Kramer  has presented today for surgery, with the diagnosis of ICD Implant   Medtronic   Cardiomyopoathy.  The various methods of treatment have been discussed with the patient and family. After consideration of risks, benefits and other options for treatment, the patient has consented to  Procedure(s): ICD IMPLANT (N/A) as a surgical intervention.  The patient's history has been reviewed, patient examined, no change in status, stable for surgery.  I have reviewed the patient's chart and labs.  Questions were answered to the patient's satisfaction.     Cordae Mccarey T Tobey Lippard

## 2021-06-28 ENCOUNTER — Telehealth: Payer: Self-pay

## 2021-06-28 NOTE — Progress Notes (Signed)
? Patient ID: Jermaine Kramer, male    DOB: 09/03/1950, 71 y.o.   MRN: [id number] ? ?HPI ? ?Jermaine Kramer is a 71 y/o male with a history of leukemia, DM, CAD (CABG 02/16/21), AF, sleep apnea, hyperlipidemia, HTN, stroke, GERD & chronic heart failure.  ? ?Echo report from 05/16/21 reviewed and showed and EF of <20% along with mild/moderate LAE and mild/moderate Jermaine.  ? ?AICD implanted 06/22/21. Admitted 05/14/21 due to lower extremity edema, increasing shortness of breath, cough and wheezing. Initially given IV lasix with transition to oral diuretics. Cardiology consult obtained. Discharged after 4 days.  ? ?He presents today for a follow-up visit with a chief complaint of moderate fatigue with minimal exertion. He describes this as chronic in nature. He has associated chest tightness (over AICD insertion site), shortness of breath and fluctuating weight. He denies any difficulty sleeping, dizziness, abdominal distention, palpitations, pedal edema, chest pain or cough.  ? ?He called 2 days ago and reports increased weight, swelling and SOB. Lasix was doubled for 2 days and his symptoms improved.  ? ?Waiting for PT to release him so that he can begin cardiac rehab which he thinks will help greatly with his energy level.  ? ?Home BP readings before he takes his medications are running 150's/80's-90's. Unsure of accuracy of BP machine.   ? ?Past Medical History:  ?Diagnosis Date  ? Arrhythmia   ? atrial fibrillation  ? CHF (congestive heart failure) ([place])   ? Coronary artery disease   ? Diabetes mellitus without complication ([place])   ? GERD (gastroesophageal reflux disease)   ? Hypercholesteremia   ? Hypertension   ? Sleep apnea   ? Stroke [facility])   ? ?Past Surgical History:  ?Procedure Laterality Date  ? CHOLECYSTECTOMY    ? COLONOSCOPY WITH PROPOFOL N/A 04/14/2019  ? Procedure: COLONOSCOPY WITH PROPOFOL;  Surgeon: Jermaine Kramer, Jermaine Pike, Jermaine Kramer;  Location: [facility] ENDOSCOPY;  Service: Gastroenterology;  Laterality: N/A;  ? CORONARY ARTERY  BYPASS GRAFT  02/16/2021  ? ICD IMPLANT N/A 06/22/2021  ? Procedure: ICD IMPLANT;  Surgeon: Jermaine Epley, Jermaine Kramer;  Location: [facility];  Service: Cardiovascular;  Laterality: N/A;  ? KNEE ARTHROSCOPY    ? RENAL CYST EXCISION    ? ?Family History  ?Problem Relation Age of Onset  ? Parkinson's disease Mother   ? Lupus Mother   ? Heart disease Father   ? ?Social History  ? ?Tobacco Use  ? Smoking status: Never  ? Smokeless tobacco: Never  ?Substance Use Topics  ? Alcohol use: Not Currently  ? ?Allergies  ?Allergen Reactions  ? Doxycycline Anaphylaxis  ?  Patient does not recall having this reaction.  ?  ? Empagliflozin Itching  ?  Groin infection ?Caused a groin infection.  ?  ? Heparin Other (See Comments)  ?  Unknown ?  ? ?Prior to Admission medications   ?Medication Sig Start Date End Date Taking? Authorizing Provider  ?amiodarone (PACERONE) 200 MG tablet Take 200 mg by mouth daily. 03/06/21  Yes Provider, Historical, Jermaine Kramer  ?apixaban (ELIQUIS) 5 MG TABS tablet Take 1 tablet (5 mg total) by mouth 2 (two) times daily. DO NOT RESTART UNTIL 06/27/2021. 06/22/21  Yes Jermaine Kramer, Jermaine Haber, Jermaine Kramer  ?aspirin EC 81 MG tablet Take 81 mg by mouth daily.   Yes Provider, Historical, Jermaine Kramer  ?atorvastatin (LIPITOR) 80 MG tablet Take 80 mg by mouth daily. 02/10/21  Yes Provider, Historical, Jermaine Kramer  ?furosemide (LASIX) 40 MG tablet Take 1 tablet (40  mg total) by mouth daily. ?Patient taking differently: Take 20 mg by mouth daily. 05/18/21  Yes Jermaine Boroughs, Jermaine Kramer  ?lisinopril (ZESTRIL) 2.5 MG tablet Take 1 tablet (2.5 mg total) by mouth daily. 06/02/21  Yes Jermaine Graff, Jermaine Kramer  ?metFORMIN (GLUCOPHAGE) 1000 MG tablet Take 1,000 mg by mouth 2 (two) times daily with a meal.   Yes Provider, Historical, Jermaine Kramer  ?metoprolol succinate (TOPROL-XL) 25 MG 24 hr tablet Take 1.5 tablets (37.5 mg total) by mouth daily. ?Patient taking differently: Take 25 mg by mouth daily. 05/18/21  Yes Jermaine Boroughs, Jermaine Kramer  ?omeprazole (PRILOSEC) 40 MG capsule Take 40 mg by mouth  daily. 03/11/21  Yes Provider, Historical, Jermaine Kramer  ?tamsulosin (FLOMAX) 0.4 MG CAPS capsule Take 0.8 mg by mouth daily.   Yes Provider, Historical, Jermaine Kramer  ? ? ?Review of Systems  ?Constitutional:  Positive for fatigue (easily). Negative for appetite change.  ?HENT:  Negative for congestion, postnasal drip and sore throat.   ?Eyes: Negative.   ?Respiratory:  Positive for chest tightness and shortness of breath. Negative for cough.   ?Cardiovascular:  Negative for chest pain, palpitations and leg swelling.  ?Gastrointestinal:  Negative for abdominal distention and abdominal pain.  ?Endocrine: Negative.   ?Genitourinary: Negative.   ?Musculoskeletal:  Negative for back pain and neck pain.  ?Skin: Negative.   ?Allergic/Immunologic: Negative.   ?Neurological:  Negative for dizziness and light-headedness.  ?Hematological:  Negative for adenopathy. Does not bruise/bleed easily.  ?Psychiatric/Behavioral:  Negative for dysphoric mood and sleep disturbance. The patient is not nervous/anxious.   ? ?Vitals:  ? 06/30/21 0819  ?BP: 102/64  ?Pulse: 99  ?Resp: 18  ?SpO2: 96%  ?Weight: 188 lb 5 oz (85.4 kg)  ?Height: 5\' 6"  (1.676 m)  ? ?Wt Readings from Last 3 Encounters:  ?06/30/21 188 lb 5 oz (85.4 kg)  ?06/22/21 187 lb (84.8 kg)  ?06/15/21 197 lb (89.4 kg)  ? ?Lab Results  ?Component Value Date  ? CREATININE 1.17 06/15/2021  ? CREATININE 1.01 05/18/2021  ? CREATININE 1.32 (H) 05/17/2021  ? ?Physical Exam ?Vitals and nursing note reviewed. Exam conducted with a chaperone present (wife).  ?Constitutional:   ?   Appearance: Normal appearance.  ?HENT:  ?   Head: Normocephalic and atraumatic.  ?Cardiovascular:  ?   Rate and Rhythm: Normal rate and regular rhythm.  ?Pulmonary:  ?   Effort: Pulmonary effort is normal. No respiratory distress.  ?   Breath sounds: No wheezing or rales.  ?Abdominal:  ?   General: There is no distension.  ?   Palpations: Abdomen is soft.  ?   Tenderness: There is no abdominal tenderness.  ?Musculoskeletal:  ?    Cervical back: Normal range of motion and neck supple.  ?   Right lower leg: Edema (trace pitting) present.  ?   Left lower leg: No edema.  ?Skin: ?   General: Skin is warm and dry.  ?Neurological:  ?   General: No focal deficit present.  ?   Mental Status: He is alert and oriented to person, place, and time.  ?Psychiatric:     ?   Mood and Affect: Mood normal.     ?   Behavior: Behavior normal.     ?   Thought Content: Thought content normal.  ? ? ?Assessment & Plan: ? ?1: Chronic heart failure with reduced ejection fraction- ?- NYHA class III ?- euvolemic today ?- weighing daily and home weight chart reviewed; reminded to call for an overnight  weight gain of > 2 pounds or a weekly weight gain of >5 pounds ?- weight up 2 pounds from last visit here 1 month ago ?- will check BMP today due to taking extra diuretic recently ?- not adding salt and has been ready food labels for sodium content; understands to keep daily sodium intake to 2000mg  / day ?- on GDMT of metoprolol and lisinopril ?- allergic to empagliflozin (groin infection) ?- BP has been low so spironolactone is currently on hold; consider transition from lisinopril to entresto if BP becomes more consistent ?- saw EP Quentin Ore) 06/15/21  ?- had AICD implanted 06/22/21 ?- BNP 512.0 ?- PharmD reconciled medications with the patient ? ?2: HTN- ?- BP looks good although on the low side (102/64) ?- will decrease metoprolol to 12.5mg  daily; they will ask [facility] nurse to check their BP monitor with hers  ?- advised to some days check BP before taking medications and other days, take it ~ 1 hour after taking meds ?- saw PCP [name]) 06/17/21 ?- BMP 06/15/21 reviewed and showed sodium 138, potassium 3.9, creatinine 1.17 & GFR >60 ? ?3: DM- ?- A1c 05/16/21 was 7.8% ?- currently on metformin ? ?4: Atrial fibrillation- ?- saw cardiology Corky Sox) 05/27/21 ?- currently on amiodarone and apixaban ? ?5: CAD- ?- CABG with a LIMA to LAD, SVG to ramus, and SVG to PDA on  02/16/21 ? ? ?Medication list reviewed.  ? ?Return in 1 month, sooner if needed.  ? ? ?

## 2021-06-28 NOTE — Telephone Encounter (Signed)
Patient's wife called to report pt weight gain with symptoms. She states patient weighed 188.6 yesterday morning and this morning he weighed 191.1 lbs. She reports that he has started having some swelling in his feet and increased shortness of breath with activity.  Patient advised to double his dose of lasix today and tomorrow per Darylene Price, FNP. and made an appt to see him in clinic on Thursday morning at 8:30. Patient's wife verbalized understanding to double lasix today and tomorrow.  Georg Ruddle, RN

## 2021-06-30 ENCOUNTER — Other Ambulatory Visit: Payer: Self-pay

## 2021-06-30 ENCOUNTER — Ambulatory Visit (HOSPITAL_BASED_OUTPATIENT_CLINIC_OR_DEPARTMENT_OTHER): Payer: Medicare Other | Admitting: Family

## 2021-06-30 ENCOUNTER — Encounter: Payer: Self-pay | Admitting: Pharmacist

## 2021-06-30 ENCOUNTER — Other Ambulatory Visit
Admission: RE | Admit: 2021-06-30 | Discharge: 2021-06-30 | Disposition: A | Discharge status: Home / Self Care | Payer: Medicare Other | Source: Ambulatory Visit | Attending: Family | Admitting: Family

## 2021-06-30 ENCOUNTER — Encounter: Payer: Self-pay | Admitting: Family

## 2021-06-30 VITALS — BP 102/64 | HR 99 | Resp 18 | Ht 66.0 in | Wt 188.3 lb

## 2021-06-30 DIAGNOSIS — E119 Type 2 diabetes mellitus without complications: Secondary | ICD-10-CM | POA: Insufficient documentation

## 2021-06-30 DIAGNOSIS — I110 Hypertensive heart disease with heart failure: Secondary | ICD-10-CM | POA: Insufficient documentation

## 2021-06-30 DIAGNOSIS — Z951 Presence of aortocoronary bypass graft: Secondary | ICD-10-CM | POA: Insufficient documentation

## 2021-06-30 DIAGNOSIS — I2510 Atherosclerotic heart disease of native coronary artery without angina pectoris: Secondary | ICD-10-CM | POA: Insufficient documentation

## 2021-06-30 DIAGNOSIS — Z856 Personal history of leukemia: Secondary | ICD-10-CM | POA: Insufficient documentation

## 2021-06-30 DIAGNOSIS — E785 Hyperlipidemia, unspecified: Secondary | ICD-10-CM | POA: Insufficient documentation

## 2021-06-30 DIAGNOSIS — Z7901 Long term (current) use of anticoagulants: Secondary | ICD-10-CM | POA: Insufficient documentation

## 2021-06-30 DIAGNOSIS — I480 Paroxysmal atrial fibrillation: Secondary | ICD-10-CM

## 2021-06-30 DIAGNOSIS — Z79899 Other long term (current) drug therapy: Secondary | ICD-10-CM | POA: Insufficient documentation

## 2021-06-30 DIAGNOSIS — G4730 Sleep apnea, unspecified: Secondary | ICD-10-CM | POA: Insufficient documentation

## 2021-06-30 DIAGNOSIS — Z95810 Presence of automatic (implantable) cardiac defibrillator: Secondary | ICD-10-CM | POA: Insufficient documentation

## 2021-06-30 DIAGNOSIS — K219 Gastro-esophageal reflux disease without esophagitis: Secondary | ICD-10-CM | POA: Insufficient documentation

## 2021-06-30 DIAGNOSIS — I5022 Chronic systolic (congestive) heart failure: Secondary | ICD-10-CM | POA: Insufficient documentation

## 2021-06-30 DIAGNOSIS — Z8673 Personal history of transient ischemic attack (TIA), and cerebral infarction without residual deficits: Secondary | ICD-10-CM | POA: Insufficient documentation

## 2021-06-30 DIAGNOSIS — I10 Essential (primary) hypertension: Secondary | ICD-10-CM | POA: Diagnosis not present

## 2021-06-30 DIAGNOSIS — I4891 Unspecified atrial fibrillation: Secondary | ICD-10-CM | POA: Insufficient documentation

## 2021-06-30 LAB — BASIC METABOLIC PANEL
Anion gap: 13 (ref 5–15)
BUN: 19 mg/dL (ref 8–23)
CO2: 23 mmol/L (ref 22–32)
Calcium: 8.7 mg/dL — ABNORMAL LOW (ref 8.9–10.3)
Chloride: 103 mmol/L (ref 98–111)
Creatinine, Ser: 1.19 mg/dL (ref 0.61–1.24)
GFR, Estimated: >60 mL/min (ref >60)
Glucose, Bld: 162 mg/dL — ABNORMAL HIGH (ref 70–99)
Potassium: 3.8 mmol/L (ref 3.5–5.1)
Sodium: 139 mmol/L (ref 135–145)

## 2021-06-30 NOTE — Progress Notes (Addendum)
[place] COUNSELING NOTE ? ?Guideline-Directed Medical Therapy/Evidence Based Medicine ? ?ACE/ARB/ARNI: Lisinopril 2.5 mg daily ?Beta Blocker: Metoprolol succinate 25 mg daily  ?Aldosterone Antagonist:  spironolactone held d/t hypotension ?Diuretic: Furosemide 20 mg daily decreased from 40 mg daily.  ?SGLT2i:  none - patient developed groin infection while on Jardiance ? ?Adherence Assessment ? ?Do you ever forget to take your medication? [] Yes ?[x] No  ?Do you ever skip doses due to side effects? [] Yes ?[x] No  ?Do you have trouble affording your medicines? [] Yes ?[x] No  ?Are you ever unable to pick up your medication due to transportation difficulties? [] Yes ?[x] No  ?Do you ever stop taking your medications because you don't believe they are helping? [] Yes ?[x] No  ?Do you check your weight daily? [x] Yes ?[] No  ? ?Adherence strategy: Medications are managed and assigned by wife  ? ?Barriers to obtaining medications: none stated by the patient.  ? ?Vital signs: HR 99, BP 102/64, weight (pounds) 188 (pt weighted himself at home) ?ECHO: Date 05/2021, EF < 20; ICD placed by Dr Quentin Ore ? ?BMP Latest Ref Rng & Units 06/15/2021 05/18/2021 05/17/2021  ?Glucose 70 - 99 mg/dL 154(H) 187(H) 319(H)  ?BUN 8 - 23 mg/dL 21 21 30(H)  ?Creatinine 0.61 - 1.24 mg/dL 1.17 1.01 1.32(H)  ?Sodium 135 - 145 mmol/L 138 135 134(L)  ?Potassium 3.5 - 5.1 mmol/L 3.9 3.7 3.9  ?Chloride 98 - 111 mmol/L 107 99 98  ?CO2 22 - 32 mmol/L 23 28 27   ?Calcium 8.9 - 10.3 mg/dL 8.5(L) 8.7(L) 8.6(L)  ? ? ?Past Medical History:  ?Diagnosis Date  ? Arrhythmia   ? atrial fibrillation  ? CHF (congestive heart failure) ([place])   ? Coronary artery disease   ? Diabetes mellitus without complication ([place])   ? GERD (gastroesophageal reflux disease)   ? Hypercholesteremia   ? Hypertension   ? Sleep apnea   ? Stroke [facility])   ? ? ?ASSESSMENT ?71 year old male who presents to the HF clinic for follow up appointment.  Overall, patient is doing well and has no issues obtaining or taking medications. Pt logs blood pressures and weights regularly. Noted BP at home is significantly higher (~40 mmHg), than clinic readying. PMH includes HTN, DM, ACSVD s/p CABG x 3, HFrEF. ? ?Last hospital admission was 05/14/2021 for HF exacerbation. Noted EF < 20% on admission. ICD implant done by Dr Quentin Ore on 06/22/2021. ? ?Patient reported increased SOB, and weight gain 2 days ago.  He was instructed to double his furosemide for 2 days. Presents today for further assessment and reports improvement in breathing and 4bl weigh drop. His blood pressure remains low and a limiting factor for ARNI or aldosterone antagonist. ? ? ?PLAN ? ?Patient will have [facility] nurse check his home BP machine for accuracy ?Recommend decreasing metoprolol to 12.5mg  daily ?Fluid status improved but to euvolemic yet. Will benefit for additional 1-2 day of furosemide 40mg . ?Continue all other medication as previously prescribed ? ? ?Time spent: 20 minutes ? ?Nico Rogness Rodriguez-Guzman PharmD, BCPS ?06/30/2021 9:21 AM ? ? ?Current Outpatient Medications:  ?  amiodarone (PACERONE) 200 MG tablet, Take 200 mg by mouth daily., Disp: , Rfl:  ?  apixaban (ELIQUIS) 5 MG TABS tablet, Take 1 tablet (5 mg total) by mouth 2 (two) times daily. DO NOT RESTART UNTIL 06/27/2021., Disp: 60 tablet, Rfl:  ?  aspirin EC 81 MG tablet, Take 81 mg by mouth daily., Disp: , Rfl:  ?  atorvastatin (  LIPITOR) 80 MG tablet, Take 80 mg by mouth daily., Disp: , Rfl:  ?  furosemide (LASIX) 40 MG tablet, Take 1 tablet (40 mg total) by mouth daily. (Patient taking differently: Take 20 mg by mouth daily.), Disp: 30 tablet, Rfl: 0 ?  lisinopril (ZESTRIL) 2.5 MG tablet, Take 1 tablet (2.5 mg total) by mouth daily., Disp: 30 tablet, Rfl: 5 ?  metFORMIN (GLUCOPHAGE) 1000 MG tablet, Take 1,000 mg by mouth 2 (two) times daily with a meal., Disp: , Rfl:  ?  metoprolol succinate (TOPROL-XL) 25 MG 24 hr tablet, Take 1.5 tablets  (37.5 mg total) by mouth daily. (Patient taking differently: Take 12.5 mg by mouth daily.), Disp: 60 tablet, Rfl: 0 ?  omeprazole (PRILOSEC) 40 MG capsule, Take 40 mg by mouth daily., Disp: , Rfl:  ?  tamsulosin (FLOMAX) 0.4 MG CAPS capsule, Take 0.8 mg by mouth daily., Disp: , Rfl:  ? ? ? ?MEDICATION ADHERENCES TIPS AND STRATEGIES ?Taking medication as prescribed improves patient outcomes in heart failure (reduces hospitalizations, improves symptoms, increases survival) ?Side effects of medications can be managed by decreasing doses, switching agents, stopping drugs, or adding additional therapy. Please let someone in the [facility] know if you have having bothersome side effects so we can modify your regimen. Do not alter your medication regimen without talking to [place].  ?Medication reminders can help patients remember to take drugs on time. If you are missing or forgetting doses you can try linking behaviors, using pill boxes, or an electronic reminder like an alarm on your phone or an app. Some people can also get automated phone calls as medication reminders.   ?

## 2021-06-30 NOTE — Patient Instructions (Addendum)
Continue weighing daily and call for an overnight weight gain of 3 pounds or more or a weekly weight gain of more than 5 pounds. ? ? ? ?Decrease your metoprolol to 1/2 tablet daily.  ? ? ?Check your blood pressure sometimes before taking your medications and sometimes about an hour after taking your medications.  ?

## 2021-07-06 ENCOUNTER — Other Ambulatory Visit: Payer: Self-pay

## 2021-07-06 ENCOUNTER — Ambulatory Visit (INDEPENDENT_AMBULATORY_CARE_PROVIDER_SITE_OTHER): Payer: Medicare Other

## 2021-07-06 DIAGNOSIS — I255 Ischemic cardiomyopathy: Secondary | ICD-10-CM

## 2021-07-06 LAB — CUP PACEART INCLINIC DEVICE CHECK
Battery Remaining Longevity: 124 mo
Battery Voltage: 3.04 V
Brady Statistic AP VP Percent: 0.02 %
Brady Statistic AP VS Percent: 6.2 %
Brady Statistic AS VP Percent: 0.04 %
Brady Statistic AS VS Percent: 93.75 %
Brady Statistic RA Percent Paced: 6.13 %
Brady Statistic RV Percent Paced: 0.06 %
Date Time Interrogation Session: 20230308150218
HighPow Impedance: 51 Ohm
Implantable Lead Implant Date: 20230222
Implantable Lead Implant Date: 20230222
Implantable Lead Location: 753859
Implantable Lead Location: 753860
Implantable Lead Model: 5076
Implantable Pulse Generator Implant Date: 20230222
Lead Channel Impedance Value: 418 Ohm
Lead Channel Impedance Value: 456 Ohm
Lead Channel Impedance Value: 475 Ohm
Lead Channel Pacing Threshold Amplitude: 0.5 V
Lead Channel Pacing Threshold Amplitude: 1 V
Lead Channel Pacing Threshold Pulse Width: 0.4 ms
Lead Channel Pacing Threshold Pulse Width: 0.4 ms
Lead Channel Sensing Intrinsic Amplitude: 1.75 mV
Lead Channel Sensing Intrinsic Amplitude: 10 mV
Lead Channel Setting Pacing Amplitude: 3.5 V
Lead Channel Setting Pacing Amplitude: 3.5 V
Lead Channel Setting Pacing Pulse Width: 0.4 ms
Lead Channel Setting Sensing Sensitivity: 0.3 mV

## 2021-07-06 NOTE — Progress Notes (Signed)
Wound check appointment s/p ICD implant 06/22/21. Steri-strips removed. Wound without redness or edema. Incision edges approximated, wound well healed. Normal device function. Thresholds, sensing, and impedances consistent with implant measurements. Device programmed at 3.5V for extra safety margin until 3 month visit. Histogram distribution appropriate for patient and level of activity. No mode switches or ventricular arrhythmias noted. Patient educated about wound care, arm mobility, lifting restrictions, shock plan. Patient enrolled in remote monitoring with next transmission scheduled 09/21/21. 91 day post implant follow up with Dr. Quentin Ore 09/21/21 ?

## 2021-07-06 NOTE — Patient Instructions (Signed)
? ?  After Your ICD ?(Implantable Cardiac Defibrillator) ? ? ? ?Monitor your defibrillator site for redness, swelling, and drainage. Call the device clinic at [phone] if you experience these symptoms or fever/chills. ? ?Your incision was closed with Steri-strips or staples:  You may shower and wash your incision with soap and water. Avoid lotions, ointments, or perfumes over your incision until it is well-healed. ? ?You may use a hot tub or a pool after your wound check appointment if the incision is completely closed. ? ?Do not lift, push or pull greater than 10 pounds with the affected arm until 6 weeks after your procedure. There are no other restrictions in arm movement after your wound check appointment. April 6 ? ?Your ICD is MRI compatible. ? ?Your ICD is designed to protect you from life threatening heart rhythms. Because of this, you may receive a shock.  ? ?1 shock with no symptoms:  Call the office during business hours. ?1 shock with symptoms (chest pain, chest pressure, dizziness, lightheadedness, shortness of breath, overall feeling unwell):  Call 911. ?If you experience 2 or more shocks in 24 hours:  Call 911. ?If you receive a shock, you should not drive.  ?[place] DMV - no driving for 6 months if you receive appropriate therapy from your ICD.  ? ?ICD Alerts:  Some alerts are vibratory and others beep. These are NOT emergencies. Please call our office to let us know. If this occurs at night or on weekends, it can wait until the next business day. Send a remote transmission. ? ?If your device is capable of reading fluid status (for heart failure), you will be offered monthly monitoring to review this with you.  ? ?Remote monitoring is used to monitor your ICD from home. This monitoring is scheduled every 91 days by our office. It allows [place] to keep an eye on the functioning of your device to ensure it is working properly. You will routinely see your Electrophysiologist annually (more often if  necessary).  ?

## 2021-07-11 ENCOUNTER — Other Ambulatory Visit: Payer: Self-pay

## 2021-07-11 ENCOUNTER — Encounter: Payer: Medicare Other | Attending: Internal Medicine

## 2021-07-11 DIAGNOSIS — Z951 Presence of aortocoronary bypass graft: Secondary | ICD-10-CM | POA: Insufficient documentation

## 2021-07-11 NOTE — Progress Notes (Signed)
Virtual Visit completed. Patient informed on EP and RD appointment and 6 Minute walk test. Patient also informed of patient health questionnaires on My Chart. Patient Verbalizes understanding. Visit diagnosis can be found in [facility] 02/14/2021. ?

## 2021-07-19 ENCOUNTER — Other Ambulatory Visit: Payer: Self-pay

## 2021-07-19 VITALS — Ht 68.0 in | Wt 191.3 lb

## 2021-07-19 DIAGNOSIS — Z951 Presence of aortocoronary bypass graft: Secondary | ICD-10-CM

## 2021-07-19 NOTE — Progress Notes (Signed)
Cardiac Individual Treatment Plan ? ?Patient Details  ?Name: Jermaine Kramer ?MRN: [id number] ?Date of Birth: 04/20/1951 ?Referring Provider:   ?Flowsheet Row Cardiac Rehab from 07/19/2021 in [facility] Cardiac and Pulmonary Rehab  ?Referring Provider Ernesta Amble MD  ? ?  ? ? ?Initial Encounter Date:  ?Flowsheet Row Cardiac Rehab from 07/19/2021 in [facility] Cardiac and Pulmonary Rehab  ?Date 07/19/21  ? ?  ? ? ?Visit Diagnosis: S/P CABG x 3 ? ?Patient's Home Medications on Admission: ? ?Current Outpatient Medications:  ?  amiodarone (PACERONE) 200 MG tablet, Take 200 mg by mouth daily., Disp: , Rfl:  ?  apixaban (ELIQUIS) 5 MG TABS tablet, Take 1 tablet (5 mg total) by mouth 2 (two) times daily. DO NOT RESTART UNTIL 06/27/2021., Disp: 60 tablet, Rfl:  ?  aspirin EC 81 MG tablet, Take 81 mg by mouth daily., Disp: , Rfl:  ?  atorvastatin (LIPITOR) 40 MG tablet, Take 40 mg by mouth daily. (Patient not taking: Reported on 07/11/2021), Disp: , Rfl:  ?  atorvastatin (LIPITOR) 80 MG tablet, Take 80 mg by mouth daily., Disp: , Rfl:  ?  furosemide (LASIX) 40 MG tablet, Take 1 tablet (40 mg total) by mouth daily. (Patient taking differently: Take 20 mg by mouth daily.), Disp: 30 tablet, Rfl: 0 ?  glucose blood (PRECISION QID TEST) test strip, Use 3 (three) times daily Use as instructed. One touch ultra, Disp: , Rfl:  ?  lisinopril (ZESTRIL) 2.5 MG tablet, Take 1 tablet (2.5 mg total) by mouth daily., Disp: 30 tablet, Rfl: 5 ?  metFORMIN (GLUCOPHAGE) 1000 MG tablet, Take 1,000 mg by mouth 2 (two) times daily with a meal., Disp: , Rfl:  ?  metoprolol succinate (TOPROL-XL) 25 MG 24 hr tablet, Take 1.5 tablets (37.5 mg total) by mouth daily. (Patient taking differently: Take 12.5 mg by mouth daily.), Disp: 60 tablet, Rfl: 0 ?  omeprazole (PRILOSEC) 40 MG capsule, Take 40 mg by mouth daily., Disp: , Rfl:  ?  tamsulosin (FLOMAX) 0.4 MG CAPS capsule, Take 0.8 mg by mouth daily., Disp: , Rfl:  ? ?Past Medical History: ?Past Medical History:   ?Diagnosis Date  ? Arrhythmia   ? atrial fibrillation  ? CHF (congestive heart failure) ([place])   ? Coronary artery disease   ? Diabetes mellitus without complication ([place])   ? GERD (gastroesophageal reflux disease)   ? Hypercholesteremia   ? Hypertension   ? Sleep apnea   ? Stroke [facility])   ? ? ?Tobacco Use: ?Social History  ? ?Tobacco Use  ?Smoking Status Never  ?Smokeless Tobacco Never  ? ? ?Labs: ?Recent Review Flowsheet Data   ? ? Labs for ITP Cardiac and Pulmonary Rehab Latest Ref Rng & Units 04/21/2021 05/16/2021  ? Hemoglobin A1c 4.8 - 5.6 % - 7.8(H)  ? HCO3 20.0 - 28.0 mmol/L 23.2 -  ? ACIDBASEDEF 0.0 - 2.0 mmol/L 0.6 -  ? O2SAT % 97.4 -  ? ?  ? ? ? ?Exercise Target Goals: ?Exercise Program Goal: ?Individual exercise prescription set using results from initial 6 min walk test and THRR while considering  patient?s activity barriers and safety.  ? ?Exercise Prescription Goal: ?Initial exercise prescription builds to 30-45 minutes a day of aerobic activity, 2-3 days per week.  Home exercise guidelines will be given to patient during program as part of exercise prescription that the participant will acknowledge. ? ? ?Education: Aerobic Exercise: ?- Group verbal and visual presentation on the components of exercise prescription. Introduces F.I.T.T principle  from ACSM for exercise prescriptions.  Reviews F.I.T.T. principles of aerobic exercise including progression. Written material given at graduation. ?Flowsheet Row Cardiac Rehab from 07/19/2021 in [facility] Cardiac and Pulmonary Rehab  ?Education need identified 07/19/21  ? ?  ? ? ?Education: Resistance Exercise: ?- Group verbal and visual presentation on the components of exercise prescription. Introduces F.I.T.T principle from ACSM for exercise prescriptions  Reviews F.I.T.T. principles of resistance exercise including progression. Written material given at graduation. ? ?  ?Education: Exercise & Equipment Safety: ?- Individual verbal instruction and demonstration of  equipment use and safety with use of the equipment. ?Flowsheet Row Cardiac Rehab from 07/11/2021 in [facility] Cardiac and Pulmonary Rehab  ?Date 07/11/21  ?Educator [facility]  ?Instruction Review Code 1- Verbalizes Understanding  ? ?  ? ? ?Education: Exercise Physiology & General Exercise Guidelines: ?- Group verbal and written instruction with models to review the exercise physiology of the cardiovascular system and associated critical values. Provides general exercise guidelines with specific guidelines to those with heart or lung disease.  ?Flowsheet Row Cardiac Rehab from 07/19/2021 in [facility] Cardiac and Pulmonary Rehab  ?Education need identified 07/19/21  ? ?  ? ? ?Education: Flexibility, Balance, Mind/Body Relaxation: ?- Group verbal and visual presentation with interactive activity on the components of exercise prescription. Introduces F.I.T.T principle from ACSM for exercise prescriptions. Reviews F.I.T.T. principles of flexibility and balance exercise training including progression. Also discusses the mind body connection.  Reviews various relaxation techniques to help reduce and manage stress (i.e. Deep breathing, progressive muscle relaxation, and visualization). Balance handout provided to take home. Written material given at graduation. ? ? ?Activity Barriers & Risk Stratification: ? Activity Barriers & Cardiac Risk Stratification - 07/19/21 1442   ? ?  ? Activity Barriers & Cardiac Risk Stratification  ? Activity Barriers Decreased Ventricular Function;Other (comment);Deconditioning   ? Comments Per patient, weight restrictions until 4/6- recent ICD placement   ? Cardiac Risk Stratification High   ? ?  ?  ? ?  ? ? ?6 Minute Walk: ? 6 Minute Walk   ? ? [place] Name 07/19/21 1458  ?  ?  ?  ? 6 Minute Walk  ? Phase Initial    ? Distance 1225 feet    ? Walk Time 6 minutes    ? # of Rest Breaks 0    ? MPH 2.32    ? METS 2.76    ? RPE 11    ? Perceived Dyspnea  1    ? VO2 Peak 9.66    ? Symptoms Yes (comment)    ? Comments  SOB, legs fatigued    ? Resting HR 75 bpm    ? Resting BP 104/64    ? Resting Oxygen Saturation  97 %    ? Exercise Oxygen Saturation  during 6 min walk 95 %    ? Max Ex. HR 111 bpm    ? Max Ex. BP 140/68    ? 2 Minute Post BP 108/68    ? ?  ?  ? ?  ? ? ?Oxygen Initial Assessment: ? ? ?Oxygen Re-Evaluation: ? ? ?Oxygen Discharge (Final Oxygen Re-Evaluation): ? ? ?Initial Exercise Prescription: ? Initial Exercise Prescription - 07/19/21 1500   ? ?  ? Date of Initial Exercise RX and Referring Provider  ? Date 07/19/21   ? Referring Provider Ernesta Amble MD   ?  ? Oxygen  ? Maintain Oxygen Saturation 88% or higher   ?  ? Treadmill  ?  MPH 2.1   ? Grade 0.5   ? Minutes 15   ? METs 2.75   ?  ? Recumbant Bike  ? Level 2   ? RPM 60   ? Watts 20   ? Minutes 15   ? METs 2.7   ?  ? REL-XR  ? Level 1   ? Speed 50   ? Minutes 15   ? METs 2.7   ?  ? Prescription Details  ? Frequency (times per week) 2   ? Duration Progress to 30 minutes of continuous aerobic without signs/symptoms of physical distress   ?  ? Intensity  ? THRR 40-80% of Max Heartrate 104-133   ? Ratings of Perceived Exertion 11-13   ? Perceived Dyspnea 0-4   ?  ? Progression  ? Progression Continue to progress workloads to maintain intensity without signs/symptoms of physical distress.   ?  ? Resistance Training  ? Training Prescription Yes   ? Weight 3 lb   ROM until 4//6: Recent ICD placement  ? Reps 10-15   ? ?  ?  ? ?  ? ? ?Perform Capillary Blood Glucose checks as needed. ? ?Exercise Prescription Changes: ? ? Exercise Prescription Changes   ? ? [place] Name 07/19/21 1500  ?  ?  ?  ?  ?  ? Response to Exercise  ? Blood Pressure (Admit) 104/64      ? Blood Pressure (Exercise) 140/68      ? Blood Pressure (Exit) 108/68      ? Heart Rate (Admit) 75 bpm      ? Heart Rate (Exercise) 111 bpm      ? Heart Rate (Exit) 79 bpm      ? Oxygen Saturation (Admit) 97 %      ? Oxygen Saturation (Exercise) 95 %      ? Rating of Perceived Exertion (Exercise) 11      ? Perceived  Dyspnea (Exercise) 1      ? Symptoms SOB, legs fatigued      ? Comments walk test results      ? ?  ?  ? ?  ? ? ?Exercise Comments: ? ? ?Exercise Goals and Review: ? ? Exercise Goals   ? ? [place] Name 07/19/21 1530

## 2021-07-19 NOTE — Patient Instructions (Signed)
Patient Instructions ? ?Patient Details  ?Name: Jermaine Kramer ?MRN: [id number] ?Date of Birth: 07-01-1950 ?Referring Provider:  Leonia Reeves,* ? ?Below are your personal goals for exercise, nutrition, and risk factors. Our goal is to help you stay on track towards obtaining and maintaining these goals. We will be discussing your progress on these goals with you throughout the program. ? ?Initial Exercise Prescription: ? Initial Exercise Prescription - 07/19/21 1500   ? ?  ? Date of Initial Exercise RX and Referring Provider  ? Date 07/19/21   ? Referring Provider Ernesta Amble MD   ?  ? Oxygen  ? Maintain Oxygen Saturation 88% or higher   ?  ? Treadmill  ? MPH 2.1   ? Grade 0.5   ? Minutes 15   ? METs 2.75   ?  ? Recumbant Bike  ? Level 2   ? RPM 60   ? Watts 20   ? Minutes 15   ? METs 2.7   ?  ? REL-XR  ? Level 1   ? Speed 50   ? Minutes 15   ? METs 2.7   ?  ? Prescription Details  ? Frequency (times per week) 2   ? Duration Progress to 30 minutes of continuous aerobic without signs/symptoms of physical distress   ?  ? Intensity  ? THRR 40-80% of Max Heartrate 104-133   ? Ratings of Perceived Exertion 11-13   ? Perceived Dyspnea 0-4   ?  ? Progression  ? Progression Continue to progress workloads to maintain intensity without signs/symptoms of physical distress.   ?  ? Resistance Training  ? Training Prescription Yes   ? Weight 3 lb   ROM until 4//6: Recent ICD placement  ? Reps 10-15   ? ?  ?  ? ?  ? ? ?Exercise Goals: ?Frequency: Be able to perform aerobic exercise two to three times per week in program working toward 2-5 days per week of home exercise. ? ?Intensity: Work with a perceived exertion of 11 (fairly light) - 15 (hard) while following your exercise prescription.  We will make changes to your prescription with you as you progress through the program. ?  ?Duration: Be able to do 30 to 45 minutes of continuous aerobic exercise in addition to a 5 minute warm-up and a 5 minute cool-down routine. ?   ?Nutrition Goals: ?Your personal nutrition goals will be established when you do your nutrition analysis with the dietician. ? ?The following are general nutrition guidelines to follow: ?Cholesterol < '200mg'$ /day ?Sodium < '1500mg'$ /day ?Fiber: Men over 50 yrs - 30 grams per day ? ?Personal Goals: ? Personal Goals and Risk Factors at Admission - 07/19/21 1531   ? ?  ? Core Components/Risk Factors/Patient Goals on Admission  ?  Weight Management Yes;Weight Loss   ? Intervention Weight Management: Develop a combined nutrition and exercise program designed to reach desired caloric intake, while maintaining appropriate intake of nutrient and fiber, sodium and fats, and appropriate energy expenditure required for the weight goal.;Weight Management: Provide education and appropriate resources to help participant work on and attain dietary goals.;Weight Management/Obesity: Establish reasonable short term and long term weight goals.   ? Admit Weight 191 lb (86.6 kg)   ? Goal Weight: Short Term 187 lb (84.8 kg)   ? Goal Weight: Long Term 180 lb (81.6 kg)   ? Expected Outcomes Short Term: Continue to assess and modify interventions until short term weight is achieved;Long Term: Adherence  to nutrition and physical activity/exercise program aimed toward attainment of established weight goal;Weight Loss: Understanding of general recommendations for a balanced deficit meal plan, which promotes 1-2 lb weight loss per week and includes a negative energy balance of [phone] kcal/d;Understanding recommendations for meals to include 15-35% energy as protein, 25-35% energy from fat, 35-60% energy from carbohydrates, less than '200mg'$  of dietary cholesterol, 20-35 gm of total fiber daily;Understanding of distribution of calorie intake throughout the day with the consumption of 4-5 meals/snacks   ? Diabetes Yes   ? Intervention Provide education about signs/symptoms and action to take for hypo/hyperglycemia.;Provide education about proper  nutrition, including hydration, and aerobic/resistive exercise prescription along with prescribed medications to achieve blood glucose in normal ranges: Fasting glucose 65-99 mg/dL   ? Expected Outcomes Short Term: Participant verbalizes understanding of the signs/symptoms and immediate care of hyper/hypoglycemia, proper foot care and importance of medication, aerobic/resistive exercise and nutrition plan for blood glucose control.;Long Term: Attainment of HbA1C < 7%.   ? Heart Failure Yes   ? Intervention Provide a combined exercise and nutrition program that is supplemented with education, support and counseling about heart failure. Directed toward relieving symptoms such as shortness of breath, decreased exercise tolerance, and extremity edema.   ? Expected Outcomes Improve functional capacity of life;Short term: Attendance in program 2-3 days a week with increased exercise capacity. Reported lower sodium intake. Reported increased fruit and vegetable intake. Reports medication compliance.;Short term: Daily weights obtained and reported for increase. Utilizing diuretic protocols set by physician.;Long term: Adoption of self-care skills and reduction of barriers for early signs and symptoms recognition and intervention leading to self-care maintenance.   ? Hypertension Yes   ? Intervention Provide education on lifestyle modifcations including regular physical activity/exercise, weight management, moderate sodium restriction and increased consumption of fresh fruit, vegetables, and low fat dairy, alcohol moderation, and smoking cessation.;Monitor prescription use compliance.   ? Expected Outcomes Short Term: Continued assessment and intervention until BP is < 140/46m HG in hypertensive participants. < 130/873mHG in hypertensive participants with diabetes, heart failure or chronic kidney disease.;Long Term: Maintenance of blood pressure at goal levels.   ? Lipids Yes   ? Intervention Provide education and support  for participant on nutrition & aerobic/resistive exercise along with prescribed medications to achieve LDL '70mg'$ , HDL >'40mg'$ .   ? Expected Outcomes Short Term: Participant states understanding of desired cholesterol values and is compliant with medications prescribed. Participant is following exercise prescription and nutrition guidelines.;Long Term: Cholesterol controlled with medications as prescribed, with individualized exercise RX and with personalized nutrition plan. Value goals: LDL < '70mg'$ , HDL > 40 mg.   ? ?  ?  ? ?  ? ? ?Tobacco Use Initial Evaluation: ?Social History  ? ?Tobacco Use  ?Smoking Status Never  ?Smokeless Tobacco Never  ? ? ?Exercise Goals and Review: ? Exercise Goals   ? ? [place] 07/19/21 1530  ?  ?  ?  ?  ?  ? Exercise Goals  ? Increase Physical Activity Yes      ? Intervention Provide advice, education, support and counseling about physical activity/exercise needs.;Develop an individualized exercise prescription for aerobic and resistive training based on initial evaluation findings, risk stratification, comorbidities and participant's personal goals.      ? Expected Outcomes Short Term: Attend rehab on a regular basis to increase amount of physical activity.;Long Term: Add in home exercise to make exercise part of routine and to increase amount of physical activity.;Long Term: Exercising regularly at  least 3-5 days a week.      ? Increase Strength and Stamina Yes      ? Intervention Provide advice, education, support and counseling about physical activity/exercise needs.;Develop an individualized exercise prescription for aerobic and resistive training based on initial evaluation findings, risk stratification, comorbidities and participant's personal goals.      ? Expected Outcomes Short Term: Perform resistance training exercises routinely during rehab and add in resistance training at home;Long Term: Improve cardiorespiratory fitness, muscular endurance and strength as measured by  increased METs and functional capacity (6MWT);Short Term: Increase workloads from initial exercise prescription for resistance, speed, and METs.      ? Able to understand and use rate of perceived exertion (RPE)

## 2021-07-21 ENCOUNTER — Emergency Department: Payer: Medicare Other

## 2021-07-21 ENCOUNTER — Encounter: Payer: Self-pay | Admitting: Emergency Medicine

## 2021-07-21 ENCOUNTER — Other Ambulatory Visit: Payer: Self-pay

## 2021-07-21 ENCOUNTER — Emergency Department
Admission: EM | Admit: 2021-07-21 | Discharge: 2021-07-21 | Disposition: A | Discharge status: Home / Self Care | Payer: Medicare Other | Source: Home / Self Care | Attending: Emergency Medicine | Admitting: Emergency Medicine

## 2021-07-21 DIAGNOSIS — E119 Type 2 diabetes mellitus without complications: Secondary | ICD-10-CM | POA: Insufficient documentation

## 2021-07-21 DIAGNOSIS — R109 Unspecified abdominal pain: Secondary | ICD-10-CM | POA: Diagnosis not present

## 2021-07-21 DIAGNOSIS — S39011A Strain of muscle, fascia and tendon of abdomen, initial encounter: Secondary | ICD-10-CM | POA: Insufficient documentation

## 2021-07-21 DIAGNOSIS — X58XXXA Exposure to other specified factors, initial encounter: Secondary | ICD-10-CM | POA: Insufficient documentation

## 2021-07-21 DIAGNOSIS — I509 Heart failure, unspecified: Secondary | ICD-10-CM | POA: Insufficient documentation

## 2021-07-21 DIAGNOSIS — T148XXA Other injury of unspecified body region, initial encounter: Secondary | ICD-10-CM

## 2021-07-21 LAB — URINALYSIS, ROUTINE W REFLEX MICROSCOPIC
Bacteria, UA: NONE SEEN
Bilirubin Urine: NEGATIVE
Glucose, UA: NEGATIVE mg/dL
Ketones, ur: NEGATIVE mg/dL
Leukocytes,Ua: NEGATIVE
Nitrite: NEGATIVE
Protein, ur: NEGATIVE mg/dL
Specific Gravity, Urine: 1.008 (ref 1.005–1.030)
pH: 5 (ref 5.0–8.0)

## 2021-07-21 LAB — CBC
HCT: 39.8 % (ref 39.0–52.0)
Hemoglobin: 13 g/dL (ref 13.0–17.0)
MCH: 29.4 pg (ref 26.0–34.0)
MCHC: 32.7 g/dL (ref 30.0–36.0)
MCV: 90 fL (ref 80.0–100.0)
Platelets: 164 10*3/uL (ref 150–400)
RBC: 4.42 MIL/uL (ref 4.22–5.81)
RDW: 14.3 % (ref 11.5–15.5)
WBC: 5.6 10*3/uL (ref 4.0–10.5)
nRBC: 0 % (ref 0.0–0.2)

## 2021-07-21 LAB — BASIC METABOLIC PANEL
Anion gap: 10 (ref 5–15)
BUN: 23 mg/dL (ref 8–23)
CO2: 26 mmol/L (ref 22–32)
Calcium: 8.4 mg/dL — ABNORMAL LOW (ref 8.9–10.3)
Chloride: 102 mmol/L (ref 98–111)
Creatinine, Ser: 1.28 mg/dL — ABNORMAL HIGH (ref 0.61–1.24)
GFR, Estimated: 59 mL/min — ABNORMAL LOW (ref >60)
Glucose, Bld: 166 mg/dL — ABNORMAL HIGH (ref 70–99)
Potassium: 3.9 mmol/L (ref 3.5–5.1)
Sodium: 138 mmol/L (ref 135–145)

## 2021-07-21 MED ORDER — MORPHINE SULFATE (PF) 4 MG/ML IV SOLN
4.0000 mg | Freq: Once | INTRAVENOUS | Status: AC
  Administered 2021-07-21: 4 mg via INTRAVENOUS
  Filled 2021-07-21: qty 1

## 2021-07-21 MED ORDER — OXYCODONE-ACETAMINOPHEN 5-325 MG PO TABS: 1.0000 | ORAL_TABLET | ORAL | 0 refills | Status: DC | PRN

## 2021-07-21 MED ORDER — ONDANSETRON HCL 4 MG/2ML IJ SOLN
4.0000 mg | Freq: Once | INTRAMUSCULAR | Status: AC
  Administered 2021-07-21: 4 mg via INTRAVENOUS
  Filled 2021-07-21: qty 2

## 2021-07-21 NOTE — ED Triage Notes (Signed)
Pt comes into the ED via POV c/o left flank pain that started this morning.,  significant history of kidney stones.  Pt denies any urinary symptoms or nausea.  PT in NAD at this time with even and unlabored respirations.  ?

## 2021-07-21 NOTE — ED Provider Notes (Signed)
? ?[facility] ?Provider Note ? ? ? Event Date/Time  ? First MD Initiated Contact with Patient 07/21/21 1742   ?  (approximate) ? ? ?History  ? ?Flank Pain ? ? ?HPI ? ?ATSUSHI YOM is a 71 y.o. male with history of diabetes, kidney stones, CHF, recent defibrillator placement this week presents emergency department with left-sided flank pain.  Patient states he is unsure if it is typical of his kidney stones as he has not had one in years.  No fever or chills.  No vomiting diarrhea. ? ?  ? ? ?Physical Exam  ? ?Triage Vital Signs: ?ED Triage Vitals  ?Enc Vitals Group  ?   BP 07/21/21 1658 104/69  ?   Pulse Rate 07/21/21 1658 83  ?   Resp 07/21/21 1658 17  ?   Temp 07/21/21 1658 98.6 ?F (37 ?C)  ?   Temp Source 07/21/21 1658 Oral  ?   SpO2 07/21/21 1658 92 %  ?   Weight 07/21/21 1659 191 lb 5.8 oz (86.8 kg)  ?   Height 07/21/21 1659 '5\' 8"'$  (1.727 m)  ?   Head Circumference --   ?   Peak Flow --   ?   Pain Score 07/21/21 1659 6  ?   Pain Loc --   ?   Pain Edu? --   ?   Excl. in [place]? --   ? ? ?Most recent vital signs: ?Vitals:  ? 07/21/21 1847 07/21/21 1848  ?BP: 116/74   ?Pulse: 83 83  ?Resp: 16   ?Temp:    ?SpO2: 97% 97%  ? ? ? ?General: Awake, no distress.   ?CV:  Good peripheral perfusion. regular rate and  rhythm ?Resp:  Normal effort. Lungs CTA ?Abd:  No distention.  Mildly tender left lower quadrant, no CVA tenderness ?Other:    ? ? ?ED Results / Procedures / Treatments  ? ?Labs ?(all labs ordered are listed, but only abnormal results are displayed) ?Labs Reviewed  ?URINALYSIS, ROUTINE W REFLEX MICROSCOPIC - Abnormal; Notable for the following components:  ?    Result Value  ? Color, Urine STRAW (*)   ? APPearance CLEAR (*)   ? Hgb urine dipstick SMALL (*)   ? All other components within normal limits  ?BASIC METABOLIC PANEL - Abnormal; Notable for the following components:  ? Glucose, Bld 166 (*)   ? Creatinine, Ser 1.28 (*)   ? Calcium 8.4 (*)   ? GFR, Estimated 59 (*)   ? All other  components within normal limits  ?CBC  ? ? ? ?EKG ? ? ? ? ?RADIOLOGY ?CT renal stone ? ? ? ?PROCEDURES: ? ? ?Procedures ? ? ?MEDICATIONS ORDERED IN ED: ?Medications  ?ondansetron (ZOFRAN) injection 4 mg (4 mg Intravenous Given 07/21/21 1844)  ?morphine (PF) 4 MG/ML injection 4 mg (4 mg Intravenous Given 07/21/21 1844)  ? ? ? ?IMPRESSION / MDM / ASSESSMENT AND PLAN / ED COURSE  ?I reviewed the triage vital signs and the nursing notes. ?             ?               ? ?Differential diagnosis includes, but is not limited to, kidney stone, diverticulitis, muscle strain ? ?The patient's labs are reassuring, CBC is normal, urinalysis is normal, basic metabolic panel does have a increase in his creatinine of 1.28 and glucose elevated at 166, patient's GFR is decreased at 59 ? ?Due to  the history of kidney stones combined with flank pain will order CT renal stone study. ? ?CT renal stone study was independently reviewed by me.  Awaiting radiology read. ?Radiology has read the CT as positive for a kidney stone that is in the left kidney, nonobstructing, no other abdominal abnormality. ? ?I did explain these findings to the patient and his wife.  Feel he may have more of a muscle strain due to the location of pain being closer to the hip and the lower back.  Patient be given pain medication at discharge.  He did have pain relief with the morphine he was given here in the ED.  Did explain to him that he will need to take a stool softener if he ends up taking several doses of the pain medication as it can cause constipation.  Patient is in agreement treatment plan.  He appears to be very stable.  Even though he does have a history of heart disease and diabetes.  I do not feel this is a complication due to the flank pain.  He was discharged in stable condition. ? ? ?  ? ? ?FINAL CLINICAL IMPRESSION(S) / ED DIAGNOSES  ? ?Final diagnoses:  ?Acute left flank pain  ?Muscle strain  ? ? ? ?Rx / DC Orders  ? ?ED Discharge Orders   ? ?       Ordered  ?  oxyCODONE-acetaminophen (PERCOCET) 5-325 MG tablet  Every 4 hours PRN       ? 07/21/21 1902  ? ?  ?  ? ?  ? ? ? ?Note:  This document was prepared using Dragon voice recognition software and may include unintentional dictation errors. ? ?  ?Versie Starks, PA-C ?07/21/21 1905 ? ?  ?Blake Divine, MD ?07/23/21 1108 ? ?

## 2021-07-21 NOTE — Discharge Instructions (Signed)
Follow-up with your regular doctor if not improving in 2 to 3 days.  Return emergency department worsening.  Take pain medication as prescribed.  If you end up taking a lot of the pain medication you may want to take a stool softener as narcotics do cause constipation. ?

## 2021-07-24 ENCOUNTER — Encounter: Payer: Self-pay | Admitting: Emergency Medicine

## 2021-07-24 ENCOUNTER — Inpatient Hospital Stay
Admission: EM | Admit: 2021-07-24 | Discharge: 2021-07-27 | DRG: 392 | Disposition: A | Discharge status: Home / Self Care | Payer: Medicare Other | Attending: Internal Medicine | Admitting: Internal Medicine

## 2021-07-24 ENCOUNTER — Other Ambulatory Visit: Payer: Self-pay

## 2021-07-24 ENCOUNTER — Emergency Department: Payer: Medicare Other

## 2021-07-24 DIAGNOSIS — I482 Chronic atrial fibrillation, unspecified: Secondary | ICD-10-CM | POA: Diagnosis present

## 2021-07-24 DIAGNOSIS — I110 Hypertensive heart disease with heart failure: Secondary | ICD-10-CM | POA: Diagnosis present

## 2021-07-24 DIAGNOSIS — E7800 Pure hypercholesterolemia, unspecified: Secondary | ICD-10-CM | POA: Diagnosis present

## 2021-07-24 DIAGNOSIS — I639 Cerebral infarction, unspecified: Secondary | ICD-10-CM | POA: Diagnosis present

## 2021-07-24 DIAGNOSIS — N200 Calculus of kidney: Secondary | ICD-10-CM

## 2021-07-24 DIAGNOSIS — Z7982 Long term (current) use of aspirin: Secondary | ICD-10-CM

## 2021-07-24 DIAGNOSIS — D696 Thrombocytopenia, unspecified: Secondary | ICD-10-CM | POA: Diagnosis present

## 2021-07-24 DIAGNOSIS — Z79899 Other long term (current) drug therapy: Secondary | ICD-10-CM | POA: Diagnosis not present

## 2021-07-24 DIAGNOSIS — Z8673 Personal history of transient ischemic attack (TIA), and cerebral infarction without residual deficits: Secondary | ICD-10-CM

## 2021-07-24 DIAGNOSIS — Z951 Presence of aortocoronary bypass graft: Secondary | ICD-10-CM | POA: Diagnosis not present

## 2021-07-24 DIAGNOSIS — R109 Unspecified abdominal pain: Principal | ICD-10-CM | POA: Diagnosis present

## 2021-07-24 DIAGNOSIS — Z7984 Long term (current) use of oral hypoglycemic drugs: Secondary | ICD-10-CM

## 2021-07-24 DIAGNOSIS — E785 Hyperlipidemia, unspecified: Secondary | ICD-10-CM | POA: Diagnosis not present

## 2021-07-24 DIAGNOSIS — I10 Essential (primary) hypertension: Secondary | ICD-10-CM | POA: Diagnosis present

## 2021-07-24 DIAGNOSIS — K219 Gastro-esophageal reflux disease without esophagitis: Secondary | ICD-10-CM | POA: Diagnosis present

## 2021-07-24 DIAGNOSIS — I420 Dilated cardiomyopathy: Secondary | ICD-10-CM | POA: Diagnosis present

## 2021-07-24 DIAGNOSIS — Z8249 Family history of ischemic heart disease and other diseases of the circulatory system: Secondary | ICD-10-CM

## 2021-07-24 DIAGNOSIS — I5042 Chronic combined systolic (congestive) and diastolic (congestive) heart failure: Secondary | ICD-10-CM | POA: Diagnosis present

## 2021-07-24 DIAGNOSIS — I2510 Atherosclerotic heart disease of native coronary artery without angina pectoris: Secondary | ICD-10-CM | POA: Diagnosis present

## 2021-07-24 DIAGNOSIS — Z95810 Presence of automatic (implantable) cardiac defibrillator: Secondary | ICD-10-CM

## 2021-07-24 DIAGNOSIS — Z7901 Long term (current) use of anticoagulants: Secondary | ICD-10-CM

## 2021-07-24 DIAGNOSIS — E1165 Type 2 diabetes mellitus with hyperglycemia: Secondary | ICD-10-CM | POA: Diagnosis present

## 2021-07-24 DIAGNOSIS — Z87892 Personal history of anaphylaxis: Secondary | ICD-10-CM | POA: Diagnosis not present

## 2021-07-24 DIAGNOSIS — K654 Sclerosing mesenteritis: Secondary | ICD-10-CM | POA: Diagnosis present

## 2021-07-24 DIAGNOSIS — Z881 Allergy status to other antibiotic agents status: Secondary | ICD-10-CM | POA: Diagnosis not present

## 2021-07-24 DIAGNOSIS — N400 Enlarged prostate without lower urinary tract symptoms: Secondary | ICD-10-CM | POA: Diagnosis present

## 2021-07-24 DIAGNOSIS — E118 Type 2 diabetes mellitus with unspecified complications: Secondary | ICD-10-CM | POA: Diagnosis not present

## 2021-07-24 DIAGNOSIS — R10A2 Flank pain, left side: Secondary | ICD-10-CM | POA: Diagnosis present

## 2021-07-24 DIAGNOSIS — E1169 Type 2 diabetes mellitus with other specified complication: Secondary | ICD-10-CM | POA: Diagnosis present

## 2021-07-24 DIAGNOSIS — K5900 Constipation, unspecified: Secondary | ICD-10-CM | POA: Diagnosis not present

## 2021-07-24 LAB — URINALYSIS, ROUTINE W REFLEX MICROSCOPIC
Bacteria, UA: NONE SEEN
Bilirubin Urine: NEGATIVE
Glucose, UA: 50 mg/dL — AB
Ketones, ur: NEGATIVE mg/dL
Nitrite: NEGATIVE
Protein, ur: NEGATIVE mg/dL
Specific Gravity, Urine: 1.018 (ref 1.005–1.030)
pH: 5 (ref 5.0–8.0)

## 2021-07-24 LAB — BASIC METABOLIC PANEL
Anion gap: 12 (ref 5–15)
BUN: 21 mg/dL (ref 8–23)
CO2: 23 mmol/L (ref 22–32)
Calcium: 8.6 mg/dL — ABNORMAL LOW (ref 8.9–10.3)
Chloride: 102 mmol/L (ref 98–111)
Creatinine, Ser: 1.23 mg/dL (ref 0.61–1.24)
GFR, Estimated: >60 mL/min (ref >60)
Glucose, Bld: 168 mg/dL — ABNORMAL HIGH (ref 70–99)
Potassium: 4.2 mmol/L (ref 3.5–5.1)
Sodium: 137 mmol/L (ref 135–145)

## 2021-07-24 LAB — CBC
HCT: 41.7 % (ref 39.0–52.0)
Hemoglobin: 13.5 g/dL (ref 13.0–17.0)
MCH: 29.2 pg (ref 26.0–34.0)
MCHC: 32.4 g/dL (ref 30.0–36.0)
MCV: 90.3 fL (ref 80.0–100.0)
Platelets: 154 10*3/uL (ref 150–400)
RBC: 4.62 MIL/uL (ref 4.22–5.81)
RDW: 14.4 % (ref 11.5–15.5)
WBC: 5.9 10*3/uL (ref 4.0–10.5)
nRBC: 0 % (ref 0.0–0.2)

## 2021-07-24 LAB — HEPATIC FUNCTION PANEL
ALT: 17 U/L (ref 0–44)
AST: 25 U/L (ref 15–41)
Albumin: 4.1 g/dL (ref 3.5–5.0)
Alkaline Phosphatase: 63 U/L (ref 38–126)
Bilirubin, Direct: 0.2 mg/dL (ref 0.0–0.2)
Indirect Bilirubin: 0.9 mg/dL (ref 0.3–0.9)
Total Bilirubin: 1.1 mg/dL (ref 0.3–1.2)
Total Protein: 7.2 g/dL (ref 6.5–8.1)

## 2021-07-24 LAB — GLUCOSE, CAPILLARY
Glucose-Capillary: 104 mg/dL — ABNORMAL HIGH (ref 70–99)
Glucose-Capillary: 113 mg/dL — ABNORMAL HIGH (ref 70–99)

## 2021-07-24 LAB — PROTIME-INR
INR: 1.3 — ABNORMAL HIGH (ref 0.8–1.2)
Prothrombin Time: 16.6 seconds — ABNORMAL HIGH (ref 11.4–15.2)

## 2021-07-24 LAB — BRAIN NATRIURETIC PEPTIDE: B Natriuretic Peptide: 716.4 pg/mL — ABNORMAL HIGH (ref 0.0–100.0)

## 2021-07-24 LAB — LIPASE, BLOOD: Lipase: 30 U/L (ref 11–51)

## 2021-07-24 MED ORDER — INSULIN ASPART 100 UNIT/ML IJ SOLN: 0.0000 [IU] | Freq: Every day | INTRAMUSCULAR | Status: DC

## 2021-07-24 MED ORDER — METOPROLOL SUCCINATE ER 25 MG PO TB24
12.5000 mg | ORAL_TABLET | Freq: Every day | ORAL | Status: DC
  Administered 2021-07-24 – 2021-07-26 (×3): 12.5 mg via ORAL
  Filled 2021-07-24 (×3): qty 1

## 2021-07-24 MED ORDER — FUROSEMIDE 20 MG PO TABS
20.0000 mg | ORAL_TABLET | Freq: Every day | ORAL | Status: DC
  Administered 2021-07-25 – 2021-07-27 (×3): 20 mg via ORAL
  Filled 2021-07-24 (×3): qty 1

## 2021-07-24 MED ORDER — PANTOPRAZOLE SODIUM 40 MG PO TBEC
40.0000 mg | DELAYED_RELEASE_TABLET | Freq: Every day | ORAL | Status: DC
Start: 2021-07-25 — End: 2021-07-27
  Administered 2021-07-25 – 2021-07-27 (×3): 40 mg via ORAL
  Filled 2021-07-24 (×3): qty 1

## 2021-07-24 MED ORDER — HYDRALAZINE HCL 20 MG/ML IJ SOLN: 5.0000 mg | INTRAMUSCULAR | Status: DC | PRN

## 2021-07-24 MED ORDER — TAMSULOSIN HCL 0.4 MG PO CAPS
0.8000 mg | ORAL_CAPSULE | Freq: Every day | ORAL | Status: DC
  Administered 2021-07-25 – 2021-07-27 (×3): 0.8 mg via ORAL
  Filled 2021-07-24 (×3): qty 2

## 2021-07-24 MED ORDER — ONDANSETRON HCL 4 MG/2ML IJ SOLN
4.0000 mg | Freq: Once | INTRAMUSCULAR | Status: AC
  Administered 2021-07-24: 4 mg via INTRAVENOUS
  Filled 2021-07-24: qty 2

## 2021-07-24 MED ORDER — APIXABAN 5 MG PO TABS
5.0000 mg | ORAL_TABLET | Freq: Two times a day (BID) | ORAL | Status: DC
  Administered 2021-07-24 – 2021-07-27 (×6): 5 mg via ORAL
  Filled 2021-07-24 (×6): qty 1

## 2021-07-24 MED ORDER — ACETAMINOPHEN 325 MG PO TABS: 650.0000 mg | ORAL_TABLET | Freq: Four times a day (QID) | ORAL | Status: DC | PRN

## 2021-07-24 MED ORDER — AMIODARONE HCL 200 MG PO TABS
200.0000 mg | ORAL_TABLET | Freq: Every day | ORAL | Status: DC
  Administered 2021-07-25 – 2021-07-27 (×3): 200 mg via ORAL
  Filled 2021-07-24 (×3): qty 1

## 2021-07-24 MED ORDER — INSULIN ASPART 100 UNIT/ML IJ SOLN
0.0000 [IU] | Freq: Three times a day (TID) | INTRAMUSCULAR | Status: DC
  Administered 2021-07-25: 2 [IU] via SUBCUTANEOUS
  Administered 2021-07-25 – 2021-07-26 (×2): 3 [IU] via SUBCUTANEOUS
  Administered 2021-07-26 – 2021-07-27 (×4): 1 [IU] via SUBCUTANEOUS
  Filled 2021-07-24 (×7): qty 1

## 2021-07-24 MED ORDER — MORPHINE SULFATE (PF) 4 MG/ML IV SOLN
4.0000 mg | Freq: Once | INTRAVENOUS | Status: AC
  Administered 2021-07-24: 4 mg via INTRAVENOUS
  Filled 2021-07-24: qty 1

## 2021-07-24 MED ORDER — LISINOPRIL 2.5 MG PO TABS
2.5000 mg | ORAL_TABLET | Freq: Every day | ORAL | Status: DC
  Filled 2021-07-24 (×3): qty 1

## 2021-07-24 MED ORDER — MORPHINE SULFATE (PF) 2 MG/ML IV SOLN
1.0000 mg | INTRAVENOUS | Status: DC | PRN
  Administered 2021-07-24 – 2021-07-26 (×12): 1 mg via INTRAVENOUS
  Filled 2021-07-24 (×12): qty 1

## 2021-07-24 MED ORDER — ATORVASTATIN CALCIUM 20 MG PO TABS
80.0000 mg | ORAL_TABLET | Freq: Every day | ORAL | Status: DC
  Administered 2021-07-25 – 2021-07-27 (×3): 80 mg via ORAL
  Filled 2021-07-24 (×3): qty 4

## 2021-07-24 MED ORDER — ASPIRIN EC 81 MG PO TBEC
81.0000 mg | DELAYED_RELEASE_TABLET | Freq: Every day | ORAL | Status: DC
  Administered 2021-07-24 – 2021-07-27 (×4): 81 mg via ORAL
  Filled 2021-07-24 (×4): qty 1

## 2021-07-24 MED ORDER — IOHEXOL 350 MG/ML SOLN
100.0000 mL | Freq: Once | INTRAVENOUS | Status: AC | PRN
  Administered 2021-07-24: 100 mL via INTRAVENOUS
  Filled 2021-07-24: qty 100

## 2021-07-24 MED ORDER — HYDROMORPHONE HCL 1 MG/ML IJ SOLN
0.5000 mg | Freq: Once | INTRAMUSCULAR | Status: AC
  Administered 2021-07-24: 0.5 mg via INTRAVENOUS
  Filled 2021-07-24: qty 1

## 2021-07-24 MED ORDER — ALBUTEROL SULFATE (2.5 MG/3ML) 0.083% IN NEBU
2.5000 mg | INHALATION_SOLUTION | RESPIRATORY_TRACT | Status: DC | PRN
Start: 2021-07-24 — End: 2021-07-27

## 2021-07-24 MED ORDER — OXYCODONE-ACETAMINOPHEN 5-325 MG PO TABS
1.0000 | ORAL_TABLET | ORAL | Status: DC | PRN
  Administered 2021-07-24 – 2021-07-26 (×5): 1 via ORAL
  Filled 2021-07-24 (×5): qty 1

## 2021-07-24 MED ORDER — ONDANSETRON HCL 4 MG/2ML IJ SOLN
4.0000 mg | Freq: Three times a day (TID) | INTRAMUSCULAR | Status: DC | PRN
Start: 2021-07-24 — End: 2021-07-27

## 2021-07-24 NOTE — ED Provider Notes (Signed)
? ?[facility] ?Provider Note ? ?Patient Contact: 11:31 AM (approximate) ? ? ?History  ? ?Flank Pain ? ? ?HPI ? ?Jermaine Kramer is a 71 y.o. male who presents the emergency department complaining of sharp back/flank pain.  Patient was seen in this department 3 days ago, and flank pain and was determined that patient had a 3 mm stone in the left kidney.  This had not started it back the ureter at that time.  Patient was prescribed pain medication, discharged.  Patient states that he is having sudden worsening pain in his back.  It is worsened with movement but he describes it as 8 out of 10 in a position of comfort and 10 out of 10 with any movement or standing.  Patient recently had a CABG with a pacemaker defibrillator placed.  He has been undergoing cardiac rehab, and of note the day prior to the onset of his symptoms he walked for the first time for any distance.  Patient was walking for 6 minutes which according to the wife has been the most activity in the last 4 to 5 months.  Patient denies any radicular symptoms down either extremity.  No loss of bowel or bladder function, no saddle anesthesia, no paresthesias.  Patient denies any changes in urination to include polyuria, hematuria, decreased urination.  Patient denies any dysuria.  He denies any constipation, diarrhea. ?  ? ? ?Physical Exam  ? ?Triage Vital Signs: ?ED Triage Vitals  ?Enc Vitals Group  ?   BP 07/24/21 1036 122/75  ?   Pulse Rate 07/24/21 1036 79  ?   Resp 07/24/21 1036 (!) 21  ?   Temp 07/24/21 1036 98.4 ?F (36.9 ?C)  ?   Temp Source 07/24/21 1036 Oral  ?   SpO2 07/24/21 1036 98 %  ?   Weight 07/24/21 1025 189 lb 9.5 oz (86 kg)  ?   Height 07/24/21 1025 '5\' 8"'$  (1.727 m)  ?   Head Circumference --   ?   Peak Flow --   ?   Pain Score 07/24/21 1025 10  ?   Pain Loc --   ?   Pain Edu? --   ?   Excl. in [place]? --   ? ? ?Most recent vital signs: ?Vitals:  ? 07/24/21 1315 07/24/21 1338  ?BP: 128/87 123/72  ?Pulse: 87 88  ?Resp:  20   ?Temp:    ?SpO2: 92% 94%  ? ? ? ?General: Alert and in no acute distress.   ?Cardiovascular:  Good peripheral perfusion ?Respiratory: Normal respiratory effort without tachypnea or retractions. Lungs CTAB. Good air entry to the bases with no decreased or absent breath sounds. ?Gastrointestinal: Bowel sounds ?4 quadrants. Soft and nontender to palpation. No guarding or rigidity. No palpable masses. No distention.  Tender to left-sided flank palpation as well as left-sided CVA tenderness. ?Musculoskeletal: Full range of motion to all extremities.  No visible signs of trauma to the lumbar spine.  Palpation midline reveals no tenderness.  Palpation along the left paraspinal muscle extending into the left flank and left lateral abdominal wall is present.  No tenderness over bilateral hips.  Dorsalis pedis pulses sensation intact and equal bilateral lower extremities. ?Neurologic:  No gross focal neurologic deficits are appreciated.  ?Skin:   No rash noted ?Other: ? ? ?ED Results / Procedures / Treatments  ? ?Labs ?(all labs ordered are listed, but only abnormal results are displayed) ?Labs Reviewed  ?URINALYSIS, ROUTINE W REFLEX  MICROSCOPIC - Abnormal; Notable for the following components:  ?    Result Value  ? Color, Urine YELLOW (*)   ? APPearance CLEAR (*)   ? Glucose, UA 50 (*)   ? Hgb urine dipstick SMALL (*)   ? Leukocytes,Ua SMALL (*)   ? All other components within normal limits  ?BASIC METABOLIC PANEL - Abnormal; Notable for the following components:  ? Glucose, Bld 168 (*)   ? Calcium 8.6 (*)   ? All other components within normal limits  ?URINE CULTURE  ?CBC  ?HEPATIC FUNCTION PANEL  ?LIPASE, BLOOD  ? ? ? ?EKG ? ? ? ? ?RADIOLOGY ? ? ?CT L-SPINE NO CHARGE ? ?Result Date: 07/24/2021 ?CLINICAL DATA:  Left flank pain. EXAM: CT LUMBAR SPINE WITHOUT CONTRAST TECHNIQUE: Multidetector CT imaging of the lumbar spine was performed without intravenous contrast administration. Multiplanar CT image reconstructions were  also generated. RADIATION DOSE REDUCTION: This exam was performed according to the departmental dose-optimization program which includes automated exposure control, adjustment of the mA and/or kV according to patient size and/or use of iterative reconstruction technique. COMPARISON:  CT abdomen pelvis dated 07/21/2021. FINDINGS: Segmentation: 5 lumbar type vertebrae. Alignment: Normal. Vertebrae: No acute fracture or focal pathologic process. Paraspinal and other soft tissues: Atherosclerotic disease of the abdominal aorta is noted. Disc levels: Mild-to-moderate multilevel degenerative disc and joint disease is most significant at L5-S1. IMPRESSION: Mild-to-moderate multilevel degenerative disc and joint disease. Electronically Signed   By: Zerita Boers M.D.   On: 07/24/2021 14:24  ? ?CT Angio Chest/Abd/Pel for Dissection W and/or Wo Contrast ? ?Result Date: 07/24/2021 ?CLINICAL DATA:  Chest pain or back pain, concern for aortic dissection. EXAM: CT ANGIOGRAPHY CHEST, ABDOMEN AND PELVIS TECHNIQUE: Non-contrast CT of the chest was initially obtained. Multidetector CT imaging through the chest, abdomen and pelvis was performed using the standard protocol during bolus administration of intravenous contrast. Multiplanar reconstructed images and MIPs were obtained and reviewed to evaluate the vascular anatomy. RADIATION DOSE REDUCTION: This exam was performed according to the departmental dose-optimization program which includes automated exposure control, adjustment of the mA and/or kV according to patient size and/or use of iterative reconstruction technique. CONTRAST:  130m OMNIPAQUE IOHEXOL 350 MG/ML SOLN COMPARISON:  CT abdomen pelvis dated 07/21/2021, chest radiograph dated 06/22/2021. FINDINGS: CTA CHEST FINDINGS Cardiovascular: Preferential opacification of the thoracic aorta. No evidence of thoracic aortic intramural hematoma, aneurysm, or dissection. Vascular calcifications are seen in the coronary arteries.  The heart is mildly enlarged. A left subclavian approach cardiac device is redemonstrated. No pericardial effusion. Mediastinum/Nodes: No enlarged mediastinal, hilar, or axillary lymph nodes. Thyroid gland, trachea, and esophagus demonstrate no significant findings. Lungs/Pleura: Small bilateral pleural effusions with associated atelectasis. Mild atelectasis of both lower lobes. No pneumothorax. Musculoskeletal: Degenerative changes are seen in the spine. Median sternotomy wires are noted. Review of the MIP images confirms the above findings. CTA ABDOMEN AND PELVIS FINDINGS VASCULAR Aorta: Atherosclerosis without aneurysm, dissection, vasculitis or significant stenosis. Celiac: Atherosclerosis at its origin without evidence of aneurysm, dissection, vasculitis or significant stenosis. SMA: Patent without evidence of aneurysm, dissection, vasculitis or significant stenosis. Renals: Mild atherosclerotic disease of the right renal artery. Both renal arteries are patent without evidence of aneurysm, dissection, vasculitis, fibromuscular dysplasia or significant stenosis. IMA: Patent without evidence of aneurysm, dissection, vasculitis or significant stenosis. Inflow: Atherosclerosis without evidence of aneurysm, dissection, vasculitis or significant stenosis. Veins: No obvious venous abnormality within the limitations of this arterial phase study. Review of the MIP images confirms  the above findings. NON-VASCULAR Hepatobiliary: No focal liver abnormality is seen. Status post cholecystectomy. No biliary dilatation. Pancreas: Unremarkable. No pancreatic ductal dilatation or surrounding inflammatory changes. Spleen: Normal in size without focal abnormality. Adrenals/Urinary Tract: Adrenal glands are unremarkable. Bilateral renal cysts measure up to 4.2 cm on the right and 2.4 cm on the left. A 2 mm nonobstructing renal calculus is seen on the left. No focal lesions on either side. No renal calculi on the right. Bladder is  unremarkable. Stomach/Bowel: There is a small hiatal hernia. Appendix appears normal. No evidence of bowel wall thickening, distention, or inflammatory changes. Lymphatic: No enlarged lymph nodes in the ab

## 2021-07-24 NOTE — H&P (Signed)
?History and Physical  ? ? Jermaine Kramer NLG:[id number] DOB: 06/23/1950 DOA: 07/24/2021 ? ?Referring MD/NP/PA:  ? ?PCP: Sherre Scarlet, PA-C  ? ?Patient coming from:  The patient is coming from home.  At baseline, pt is independent for most of ADL.       ? ?Chief Complaint: left flank pain ? ?HPI: Jermaine Kramer is a 71 y.o. male with medical history significant of hypertension, hyperlipidemia, diabetes mellitus, stroke, GERD, OSA, CAD, CABG, ICD placement, CHF with EF <20%, atrial fibrillation on Eliquis, who presents with left flank pain. ? ?Patient states that he has left flank pain for more than 4 days.  Patient was seen in ED on 3/23, and found to have 3 mm of the left nonobstructive kidney stone.  Patient was discharged home.  He states that he has worsening left flank pain today, which is constant, sharp, 8 out of 10 in severity, nonradiating.  Patient denies nausea, vomiting, diarrhea.  No fever or chills.  Patient has mild shortness of breath which is chronic issue, no significant change.  No cough, chest pain.  Patient has increased urinary frequency which he had attributed to Lasix use.  No dysuria or burning on urination. ? ?CT-renal stone image on 07/21/21: ?1. Nonobstructing 3 mm left renal calculus. No obstructive uropathy within either kidney. ?2. Otherwise no acute intra-abdominal or intrapelvic process. ?3. Bilateral renal cortical atrophy with simple appearing upper pole bilateral renal cysts. ?4. Small hiatal hernia. ?5.  Aortic Atherosclerosis (ICD10-I70.0). ? ?Data Reviewed and ED Course: pt was found to have WBC 5.9, lipase 30, urinalysis (clear appearance, small amount of leukocyte, negative bacteria, WBC 11-50), GFR > 60, temperature normal, blood pressure 123/72, heart rate 88, RR 21, oxygen saturation 98% on room air.  CT of lumbar spine is negative for acute injury, but showed degenerative disc disease.  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  Patient is admitted to [place] bed as inpatient.  Dr. Vicente Males of GI is consulted. ? ?CTA: ?1. No evidence of acute aortic syndromes. ?2. Small bilateral pleural effusions with associated atelectasis. ?3. Findings suggestive of sclerosing mesenteritis.\ ?4. Adrenals/Urinary Tract: Adrenal glands are unremarkable. Bilateral ?renal cysts measure up to 4.2 cm on the right and 2.4 cm on the ?left. A 2 mm nonobstructing renal calculus is seen on the left. No ?focal lesions on either side. No renal calculi on the right. Bladder ?is unremarkable. ? ?EKG: I have personally reviewed.  Sinus rhythm, QTc 524, PVC, bifascicular block ? ? ?Review of Systems:  ? ?General: no fevers, chills, no body weight gain, has poor appetite, has fatigue ?HEENT: no blurry vision, hearing changes or sore throat ?Respiratory: has dyspnea, no coughing, wheezing ?CV: no chest pain, no palpitations ?GI: no nausea, vomiting, abdominal pain, diarrhea, constipation ?GU: no dysuria, burning on urination, increased urinary frequency, hematuria  ?Ext: has trace leg edema ?Neuro: no unilateral weakness, numbness, or tingling, no vision change or hearing loss ?Skin: no rash, no skin tear. ?MSK: has left flank pain ?Heme: No easy bruising.  ?Travel history: No recent long distant travel. ? ? ?Allergy:  ?Allergies  ?Allergen Reactions  ? Doxycycline Anaphylaxis  ?  Patient does not recall having this reaction.  ?  ? Heparin Other (See Comments)  ?  heparin-induced thrombocytopenia ?  ? Empagliflozin Itching  ?  Groin infection ?Caused a groin infection.  ?  ? ? ?Past Medical History:  ?Diagnosis Date  ? Arrhythmia   ?  atrial fibrillation  ? CHF (congestive heart failure) ([place])   ? Coronary artery disease   ? Diabetes mellitus without complication ([place])   ? GERD (gastroesophageal reflux disease)   ? Hypercholesteremia   ? Hypertension   ? Sleep apnea   ? Stroke [facility])   ? ? ?Past Surgical History:  ?Procedure Laterality Date  ? CHOLECYSTECTOMY    ?  COLONOSCOPY WITH PROPOFOL N/A 04/14/2019  ? Procedure: COLONOSCOPY WITH PROPOFOL;  Surgeon: Toledo, Benay Pike, MD;  Location: [facility] ENDOSCOPY;  Service: Gastroenterology;  Laterality: N/A;  ? CORONARY ARTERY BYPASS GRAFT  02/16/2021  ? ICD IMPLANT N/A 06/22/2021  ? Procedure: ICD IMPLANT;  Surgeon: Vickie Epley, MD;  Location: [facility];  Service: Cardiovascular;  Laterality: N/A;  ? KNEE ARTHROSCOPY    ? RENAL CYST EXCISION    ? ? ?Social History:  reports that he has never smoked. He has never used smokeless tobacco. He reports that he does not currently use alcohol. He reports that he does not use drugs. ? ?Family History:  ?Family History  ?Problem Relation Age of Onset  ? Parkinson's disease Mother   ? Lupus Mother   ? Heart disease Father   ?  ? ?Prior to Admission medications   ?Medication Sig Start Date End Date Taking? Authorizing Provider  ?amiodarone (PACERONE) 200 MG tablet Take 200 mg by mouth daily. 03/06/21  Yes [provider]  ?apixaban (ELIQUIS) 5 MG TABS tablet Take 1 tablet (5 mg total) by mouth 2 (two) times daily. DO NOT RESTART UNTIL 06/27/2021. 06/22/21  Yes Dunn, Areta Haber, PA-C  ?aspirin EC 81 MG tablet Take 81 mg by mouth daily.   Yes [provider]  ?furosemide (LASIX) 40 MG tablet Take 1 tablet (40 mg total) by mouth daily. ?Patient taking differently: Take 20 mg by mouth daily. 05/18/21  Yes Jennye Boroughs, MD  ?lisinopril (ZESTRIL) 2.5 MG tablet Take 1 tablet (2.5 mg total) by mouth daily. 06/02/21  Yes Alisa Graff, FNP  ?metFORMIN (GLUCOPHAGE) 1000 MG tablet Take 1,000 mg by mouth 2 (two) times daily with a meal.   Yes [provider]  ?metoprolol succinate (TOPROL-XL) 25 MG 24 hr tablet Take 1.5 tablets (37.5 mg total) by mouth daily. ?Patient taking differently: Take 12.5 mg by mouth daily. 05/18/21  Yes Jennye Boroughs, MD  ?omeprazole (PRILOSEC) 40 MG capsule Take 40 mg by mouth daily. 03/11/21  Yes [provider]   ?oxyCODONE-acetaminophen (PERCOCET) 5-325 MG tablet Take 1 tablet by mouth every 4 (four) hours as needed for severe pain. 07/21/21 07/21/22 Yes Fisher, Linden Dolin, PA-C  ?tamsulosin (FLOMAX) 0.4 MG CAPS capsule Take 0.8 mg by mouth daily.   Yes [provider]  ?atorvastatin (LIPITOR) 40 MG tablet Take 40 mg by mouth daily. ?Patient not taking: Reported on 07/11/2021 07/11/21   [provider]  ?atorvastatin (LIPITOR) 80 MG tablet Take 80 mg by mouth daily. 02/10/21   [provider]  ?glucose blood (PRECISION QID TEST) test strip Use 3 (three) times daily Use as instructed. One touch ultra 07/05/21 07/05/22  [provider]  ? ? ?Physical Exam: ?Vitals:  ? 07/24/21 1315 07/24/21 1338 07/24/21 1500 07/24/21 1530  ?BP: 128/87 123/72 120/78 131/85  ?Pulse: 87 88 80 87  ?Resp:  20  16  ?Temp:      ?TempSrc:      ?SpO2: 92% 94% 95% 95%  ?Weight:      ?Height:      ? ?  General: Not in acute distress ?HEENT: ?      Eyes: PERRL, EOMI, no scleral icterus. ?      ENT: No discharge from the ears and nose, no pharynx injection, no tonsillar enlargement.  ?      Neck: No JVD, no bruit, no mass felt. ?Heme: No neck lymph node enlargement. ?Cardiac: S1/S2, RRR, No murmurs, No gallops or rubs. ?Respiratory: No rales, wheezing, rhonchi or rubs. ?GI: Soft, nondistended, nontender, no rebound pain, no organomegaly, BS present. ?GU: No hematuria ?Ext: has trace leg edema bilaterally. 1+DP/PT pulse bilaterally. ?Musculoskeletal: has left flank tenderness ?Skin: No rashes.  ?Neuro: Alert, oriented X3, cranial nerves II-XII grossly intact, moves all extremities normally. ?Psych: Patient is not psychotic, no suicidal or hemocidal ideation. ? ?Labs on Admission: I have personally reviewed following labs and imaging studies ? ?CBC: ?Recent Labs  ?Lab 07/21/21 ?1701 07/24/21 ?1037  ?WBC 5.6 5.9  ?HGB 13.0 13.5  ?HCT 39.8 41.7  ?MCV 90.0 90.3  ?PLT 164 154  ? ?Basic Metabolic Panel: ?Recent Labs  ?Lab 07/21/21 ?1701  07/24/21 ?1037  ?NA 138 137  ?K 3.9 4.2  ?CL 102 102  ?CO2 26 23  ?GLUCOSE 166* 168*  ?BUN 23 21  ?CREATININE 1.28* 1.23  ?CALCIUM 8.4* 8.6*  ? ?GFR: ?Estimated Creatinine Clearance: 57.9 mL/min (by C-G formula based on SCr

## 2021-07-24 NOTE — ED Provider Notes (Signed)
Skin----------------------------------------- ?1:32 PM on 07/24/2021 ?----------------------------------------- ? ?Blood pressure 122/75, pulse 79, temperature 98.4 ?F (36.9 ?C), temperature source Oral, resp. rate (!) 21, height '5\' 8"'$  (1.727 m), weight 86 kg, SpO2 98 %. ? ?Assuming care from [place].  In short, Jermaine Kramer is a 71 y.o. male with a chief complaint of Flank Pain ?Marland Kitchen  Refer to the original H&P for additional details. ? ?The current plan of care is to follow-up labs and imaging for left flank pain. ? 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  On reassessment, patient continues to have significant pain in his left lower quadrant as well as his left flank and we will give IV Dilaudid for additional pain control.  Case discussed with hospitalist for admission, who will consult GI for assistance. ? ?  ?Blake Divine, MD ?07/24/21 1551 ? ?

## 2021-07-24 NOTE — ED Notes (Signed)
Pt requesting something more for pain. Secure chat sent to PA regarding same.  ?

## 2021-07-24 NOTE — ED Triage Notes (Addendum)
Pt reports was here on the 9th for left flank pain, had a CT scan done and was told he had a kidney stone. Pt reports pain never went away but got suddenly worse today. Pt reports feels nauseated as well. Pt reports pain meds given are not helping. ? ?Pt also with hx of bypass surgery in October and defib/pacemaker placed 4 weeks ago.  ?

## 2021-07-25 DIAGNOSIS — K654 Sclerosing mesenteritis: Secondary | ICD-10-CM

## 2021-07-25 DIAGNOSIS — I482 Chronic atrial fibrillation, unspecified: Secondary | ICD-10-CM | POA: Diagnosis not present

## 2021-07-25 DIAGNOSIS — R109 Unspecified abdominal pain: Secondary | ICD-10-CM | POA: Diagnosis not present

## 2021-07-25 DIAGNOSIS — N200 Calculus of kidney: Secondary | ICD-10-CM | POA: Diagnosis not present

## 2021-07-25 LAB — BASIC METABOLIC PANEL
Anion gap: 10 (ref 5–15)
BUN: 22 mg/dL (ref 8–23)
CO2: 26 mmol/L (ref 22–32)
Calcium: 8.5 mg/dL — ABNORMAL LOW (ref 8.9–10.3)
Chloride: 100 mmol/L (ref 98–111)
Creatinine, Ser: 1.3 mg/dL — ABNORMAL HIGH (ref 0.61–1.24)
GFR, Estimated: 58 mL/min — ABNORMAL LOW (ref >60)
Glucose, Bld: 139 mg/dL — ABNORMAL HIGH (ref 70–99)
Potassium: 4 mmol/L (ref 3.5–5.1)
Sodium: 136 mmol/L (ref 135–145)

## 2021-07-25 LAB — GLUCOSE, CAPILLARY
Glucose-Capillary: 104 mg/dL — ABNORMAL HIGH (ref 70–99)
Glucose-Capillary: 196 mg/dL — ABNORMAL HIGH (ref 70–99)
Glucose-Capillary: 230 mg/dL — ABNORMAL HIGH (ref 70–99)
Glucose-Capillary: 181 mg/dL — ABNORMAL HIGH (ref 70–99)

## 2021-07-25 LAB — URINE CULTURE: Culture: <10000 — AB

## 2021-07-25 MED ORDER — DOCUSATE SODIUM 100 MG PO CAPS
200.0000 mg | ORAL_CAPSULE | Freq: Two times a day (BID) | ORAL | Status: DC
  Administered 2021-07-25 – 2021-07-27 (×5): 200 mg via ORAL
  Filled 2021-07-25 (×5): qty 2

## 2021-07-25 MED ORDER — POLYETHYLENE GLYCOL 3350 17 G PO PACK
17.0000 g | PACK | Freq: Every day | ORAL | Status: DC
  Administered 2021-07-25 – 2021-07-27 (×3): 17 g via ORAL
  Filled 2021-07-25 (×3): qty 1

## 2021-07-25 NOTE — Progress Notes (Signed)
?PROGRESS NOTE ? ? ? Jermaine Kramer  XBL:[id number] DOB: 1951-01-17 DOA: 07/24/2021 ?PCP: Sherre Scarlet, PA-C  ? ?Assessment & Plan: ?  ?Principal Problem: ?  Left flank pain ?Active Problems: ?  Type 2 diabetes mellitus with complication, without long-term current use of insulin ([place]) ?  Hypertension ?  CAD (coronary artery disease) ?  Hyperlipidemia LDL goal <70 ?  Stroke [facility]) ?  Chronic combined systolic and diastolic congestive heart failure ([place]) ?  Atrial fibrillation, chronic ([place]) ?  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  Kidney stone on left side ? ? ?Left flank pain: CTA showed possible sclerosing mesenteritis& has small left kidney stone which may have contributed partially. Morphine, percocet prn for pain. GI consulted & general surg consulted. See GI's note for more info ? ?Nephrolithiasis: 3 mm nonobstructive stone.  Continue on flomax. Urine cx is pending  ?  ?DM2: poorly controlled, HbA1c 7.8. Continue on SSI w/ accuchecks  ?  ?HTN: continue on metoprolol, lisinopril, lasix. IV hydralazine  ? ?Hx ofCAD: s/p of CABG. Continue on aspirin, statin  ? ?HLD: continue on statin  ?  ?Hx of CVA: continue on aspirin, statin, eliquis  ?  ?Chronic combined CHF: compensated. Continue on lasix. Monitor I/Os  ?  ?Chronic a. fib: continue on amiodarone, metoprolol, eliquis  ? ? ?DVT prophylaxis: eliquis ?Code Status: full  ?Family Communication: discussed pt's care w/ pt's family at bedside and answered their questions  ?Disposition Plan: likely d/c back home  ? ?Level of care: Telemetry Medical ? ?Status is: Inpatient ?Remains inpatient appropriate because: GI & general surg consulted ? ? ? ?Consultants:  ?GI ?General surg  ? ?Procedures:  ? ?Antimicrobials:  ? ? ?Subjective: ?Pt c/o left flank pain  ? ?Objective: ?Vitals:  ? 07/24/21 1632 07/24/21 1941 07/25/21 0440 07/25/21 0746  ?BP: 124/79 117/74 105/73 100/62  ?Pulse: 81 85 75 73  ?Resp: '18  16 16  '$ ?Temp: (!) 97.5 ?F (36.4 ?C) 98.2 ?F (36.8 ?C) (!) 97.4 ?F  (36.3 ?C) 97.9 ?F (36.6 ?C)  ?TempSrc: Oral Oral Oral Oral  ?SpO2: 96% 95% 92% 91%  ?Weight:      ?Height:      ? ? ?Intake/Output Summary (Last 24 hours) at 07/25/2021 0749 ?Last data filed at 07/25/2021 0500 ?Gross per 24 hour  ?Intake 120 ml  ?Output 400 ml  ?Net -280 ml  ? ?Filed Weights  ? 07/24/21 1025  ?Weight: 86 kg  ? ? ?Examination: ? ?General exam: Appears calm and comfortable  ?Respiratory system: Clear to auscultation. Respiratory effort normal. ?Cardiovascular system: S1 & S2 +. No rubs, gallops or clicks.  ?Gastrointestinal system: Abdomen is nondistended, soft and nontender. Hypoactive bowel sounds heard. ?Central nervous system: Alert and oriented. Moves all extremities ?Psychiatry: Judgement and insight appear normal. Mood & affect appropriate.  ? ? ? ?Data Reviewed: I have personally reviewed following labs and imaging studies ? ?CBC: ?Recent Labs  ?Lab 07/21/21 ?1701 07/24/21 ?1037  ?WBC 5.6 5.9  ?HGB 13.0 13.5  ?HCT 39.8 41.7  ?MCV 90.0 90.3  ?PLT 164 154  ? ?Basic Metabolic Panel: ?Recent Labs  ?Lab 07/21/21 ?1701 07/24/21 ?1037  ?NA 138 137  ?K 3.9 4.2  ?CL 102 102  ?CO2 26 23  ?GLUCOSE 166* 168*  ?BUN 23 21  ?CREATININE 1.28* 1.23  ?CALCIUM 8.4* 8.6*  ? ?GFR: ?Estimated Creatinine Clearance: 57.9 mL/min (by C-G formula based on SCr of 1.23 mg/dL). ?Liver Function Tests: ?Recent Labs  ?Lab 07/24/21 ?1037  ?  AST 25  ?ALT 17  ?ALKPHOS 63  ?BILITOT 1.1  ?PROT 7.2  ?ALBUMIN 4.1  ? ?Recent Labs  ?Lab 07/24/21 ?1037  ?LIPASE 30  ? ?No results for input(s): AMMONIA in the last 168 hours. ?Coagulation Profile: ?Recent Labs  ?Lab 07/24/21 ?1650  ?INR 1.3*  ? ?Cardiac Enzymes: ?No results for input(s): CKTOTAL, CKMB, CKMBINDEX, TROPONINI in the last 168 hours. ?BNP (last 3 results) ?No results for input(s): PROBNP in the last 8760 hours. ?HbA1C: ?No results for input(s): HGBA1C in the last 72 hours. ?CBG: ?Recent Labs  ?Lab 07/24/21 ?1650 07/24/21 ?2115  ?GLUCAP 104* 113*  ? ?Lipid Profile: ?No results for  input(s): CHOL, HDL, LDLCALC, TRIG, CHOLHDL, LDLDIRECT in the last 72 hours. ?Thyroid Function Tests: ?No results for input(s): TSH, T4TOTAL, FREET4, T3FREE, THYROIDAB in the last 72 hours. ?Anemia Panel: ?No results for input(s): VITAMINB12, FOLATE, FERRITIN, TIBC, IRON, RETICCTPCT in the last 72 hours. ?Sepsis Labs: ?No results for input(s): PROCALCITON, LATICACIDVEN in the last 168 hours. ? ?No results found for this or any previous visit (from the past 240 hour(s)).  ? ? ? ? ? ?Radiology Studies: ?CT L-SPINE NO CHARGE ? ?Result Date: 07/24/2021 ?CLINICAL DATA:  Left flank pain. EXAM: CT LUMBAR SPINE WITHOUT CONTRAST TECHNIQUE: Multidetector CT imaging of the lumbar spine was performed without intravenous contrast administration. Multiplanar CT image reconstructions were also generated. RADIATION DOSE REDUCTION: This exam was performed according to the departmental dose-optimization program which includes automated exposure control, adjustment of the mA and/or kV according to patient size and/or use of iterative reconstruction technique. COMPARISON:  CT abdomen pelvis dated 07/21/2021. FINDINGS: Segmentation: 5 lumbar type vertebrae. Alignment: Normal. Vertebrae: No acute fracture or focal pathologic process. Paraspinal and other soft tissues: Atherosclerotic disease of the abdominal aorta is noted. Disc levels: Mild-to-moderate multilevel degenerative disc and joint disease is most significant at L5-S1. IMPRESSION: Mild-to-moderate multilevel degenerative disc and joint disease. Electronically Signed   By: Zerita Boers M.D.   On: 07/24/2021 14:24  ? ?CT Angio Chest/Abd/Pel for Dissection W and/or Wo Contrast ? ?Result Date: 07/24/2021 ?CLINICAL DATA:  Chest pain or back pain, concern for aortic dissection. EXAM: CT ANGIOGRAPHY CHEST, ABDOMEN AND PELVIS TECHNIQUE: Non-contrast CT of the chest was initially obtained. Multidetector CT imaging through the chest, abdomen and pelvis was performed using the standard  protocol during bolus administration of intravenous contrast. Multiplanar reconstructed images and MIPs were obtained and reviewed to evaluate the vascular anatomy. RADIATION DOSE REDUCTION: This exam was performed according to the departmental dose-optimization program which includes automated exposure control, adjustment of the mA and/or kV according to patient size and/or use of iterative reconstruction technique. CONTRAST:  110m OMNIPAQUE IOHEXOL 350 MG/ML SOLN COMPARISON:  CT abdomen pelvis dated 07/21/2021, chest radiograph dated 06/22/2021. FINDINGS: CTA CHEST FINDINGS Cardiovascular: Preferential opacification of the thoracic aorta. No evidence of thoracic aortic intramural hematoma, aneurysm, or dissection. Vascular calcifications are seen in the coronary arteries. The heart is mildly enlarged. A left subclavian approach cardiac device is redemonstrated. No pericardial effusion. Mediastinum/Nodes: No enlarged mediastinal, hilar, or axillary lymph nodes. Thyroid gland, trachea, and esophagus demonstrate no significant findings. Lungs/Pleura: Small bilateral pleural effusions with associated atelectasis. Mild atelectasis of both lower lobes. No pneumothorax. Musculoskeletal: Degenerative changes are seen in the spine. Median sternotomy wires are noted. Review of the MIP images confirms the above findings. CTA ABDOMEN AND PELVIS FINDINGS VASCULAR Aorta: Atherosclerosis without aneurysm, dissection, vasculitis or significant stenosis. Celiac: Atherosclerosis at its origin without evidence of aneurysm,  dissection, vasculitis or significant stenosis. SMA: Patent without evidence of aneurysm, dissection, vasculitis or significant stenosis. Renals: Mild atherosclerotic disease of the right renal artery. Both renal arteries are patent without evidence of aneurysm, dissection, vasculitis, fibromuscular dysplasia or significant stenosis. IMA: Patent without evidence of aneurysm, dissection, vasculitis or significant  stenosis. Inflow: Atherosclerosis without evidence of aneurysm, dissection, vasculitis or significant stenosis. Veins: No obvious venous abnormality within the limitations of this arterial phase study. Revi

## 2021-07-25 NOTE — Consult Note (Signed)
?  ?Jermaine Kramer , MD ?[address] [place], [place], [place], [address] ?[address] [place], [place], [place], [address] ?Phone: [phone]  ?Fax: [phone] ? Consultation ? ?Referring Provider:    Dr Blaine Hamper  ?Primary Care Physician:  Sherre Scarlet, PA-C ?Primary Gastroenterologist:  Dr. Alice Reichert         ?Reason for Consultation:     Abnormal ct findings  ? ?Date of Admission:  07/24/2021 ?Date of Consultation:  07/25/2021 ?       ? HPI:   ?Jermaine Kramer is a 71 y.o. male Is a patient who is usually followed by Dr. Alice Reichert at [facility].  Last seen at their office in 01/19/2019.  And underwent a colonoscopy in December 2020 that showed 2 sessile polyps in the sigmoid colon that were resected polyps were tubular adenomas. ? ? ?The patient presented to the hospital with left flank pain yesterday.  Past medical history includes GERD OSA, CAD, CABG, ICD, CHF, atrial fibrillation on Eliquis. ? ?He says that he has not previously had any abdominal pain but this started a few days back , worse when he lays on the left side, also noted a achange in bowel habits over last few weeks where he feels more constipated and doesn't go as often, complains of abdominal distension. No weight loss or night sweats or any other symptoms.  ? ? ?In the emergency room he underwent a CT angiogram chest abdomen pelvis for dissectionThat showed no evidence of acute aortic syndromes small bilateral pleural effusions with associated atelectasis features of slightly increased attenuation of the mesenteric fat with mass effect on the surrounding loops of bowel with stool.  Tissue nodules within the mesenteric fat but may represent sclerosing mesenteritis. ? ? ?He underwent a CT scan renal stone protocol on 07/21/2021 that showed small hiatal hernia and a 3 mm left renal calculus that was nonobstructive. ? ?01/16/2019 ultrasound abdomen showedAbdominal fat-containing hernia. ? ?On admission hemoglobin 13.5 g, LFTs  normal, lipase normal, BNP elevated at 716, INR 1.3. ? ? ?Past Medical History:  ?Diagnosis Date  ? Arrhythmia   ? atrial fibrillation  ? CHF (congestive heart failure) ([place])   ? Coronary artery disease   ? Diabetes mellitus without complication ([place])   ? GERD (gastroesophageal reflux disease)   ? Hypercholesteremia   ? Hypertension   ? Sleep apnea   ? Stroke [name])   ? ? ?Past Surgical History:  ?Procedure Laterality Date  ? CHOLECYSTECTOMY    ? COLONOSCOPY WITH PROPOFOL N/A 04/14/2019  ? Procedure: COLONOSCOPY WITH PROPOFOL;  Surgeon: Toledo, Benay Pike, MD;  Location: [facility] ENDOSCOPY;  Service: Gastroenterology;  Laterality: N/A;  ? CORONARY ARTERY BYPASS GRAFT  02/16/2021  ? ICD IMPLANT N/A 06/22/2021  ? Procedure: ICD IMPLANT;  Surgeon: Vickie Epley, MD;  Location: [facility];  Service: Cardiovascular;  Laterality: N/A;  ? KNEE ARTHROSCOPY    ? RENAL CYST EXCISION    ? ? ?Prior to Admission medications   ?Medication Sig Start Date End Date Taking? Authorizing Provider  ?amiodarone (PACERONE) 200 MG tablet Take 200 mg by mouth daily. 03/06/21  Yes [provider]  ?apixaban (ELIQUIS) 5 MG TABS tablet Take 1 tablet (5 mg total) by mouth 2 (two) times daily. DO NOT RESTART UNTIL 06/27/2021. 06/22/21  Yes Dunn, Areta Haber, PA-C  ?aspirin EC 81 MG tablet Take 81 mg by mouth daily.   Yes [provider]  ?atorvastatin (LIPITOR) 80 MG tablet  Take 80 mg by mouth daily. 02/10/21  Yes [provider]  ?furosemide (LASIX) 40 MG tablet Take 1 tablet (40 mg total) by mouth daily. ?Patient taking differently: Take 20 mg by mouth daily. 05/18/21  Yes Jennye Boroughs, MD  ?lisinopril (ZESTRIL) 2.5 MG tablet Take 1 tablet (2.5 mg total) by mouth daily. 06/02/21  Yes Alisa Graff, FNP  ?metFORMIN (GLUCOPHAGE) 1000 MG tablet Take 1,000 mg by mouth 2 (two) times daily with a meal.   Yes [provider]  ?metoprolol succinate (TOPROL-XL) 25 MG 24 hr tablet Take 1.5 tablets (37.5 mg total) by  mouth daily. ?Patient taking differently: Take 12.5 mg by mouth daily. 05/18/21  Yes Jennye Boroughs, MD  ?omeprazole (PRILOSEC) 40 MG capsule Take 40 mg by mouth daily. 03/11/21  Yes [provider]  ?oxyCODONE-acetaminophen (PERCOCET) 5-325 MG tablet Take 1 tablet by mouth every 4 (four) hours as needed for severe pain. 07/21/21 07/21/22 Yes Fisher, Linden Dolin, PA-C  ?tamsulosin (FLOMAX) 0.4 MG CAPS capsule Take 0.8 mg by mouth daily.   Yes [provider]  ?atorvastatin (LIPITOR) 40 MG tablet Take 40 mg by mouth daily. ?Patient not taking: Reported on 07/11/2021 07/11/21   [provider]  ?glucose blood (PRECISION QID TEST) test strip Use 3 (three) times daily Use as instructed. One touch ultra 07/05/21 07/05/22  [provider]  ? ? ?Family History  ?Problem Relation Age of Onset  ? Parkinson's disease Mother   ? Lupus Mother   ? Heart disease Father   ?  ? ?Social History  ? ?Tobacco Use  ? Smoking status: Never  ? Smokeless tobacco: Never  ?Vaping Use  ? Vaping Use: Never used  ?Substance Use Topics  ? Alcohol use: Not Currently  ? Drug use: No  ? ? ?Allergies as of 07/24/2021 - Review Complete 07/24/2021  ?Allergen Reaction Noted  ? Doxycycline Anaphylaxis 12/08/2020  ? Heparin Other (See Comments) 05/27/2021  ? Empagliflozin Itching 02/07/2021  ? ? ?Review of Systems:    ?All systems reviewed and negative except where noted in HPI. ? ? Physical Exam:  ?Vital signs in last 24 hours: ?Temp:  [97.4 ?F (36.3 ?C)-98.4 ?F (36.9 ?C)] 97.9 ?F (36.6 ?C) (03/27 0746) ?Pulse Rate:  [73-88] 73 (03/27 0746) ?Resp:  [16-21] 16 (03/27 0746) ?BP: (100-131)/(62-87) 100/62 (03/27 0746) ?SpO2:  [91 %-98 %] 91 % (03/27 0746) ?Weight:  [86 kg] 86 kg (03/26 1025) ?Last BM Date : 07/23/21 ?General:   Pleasant, cooperative in NAD ?Head:  Normocephalic and atraumatic. ?Eyes:   No icterus.   Conjunctiva pink. PERRLA. ?Ears:  Normal auditory acuity. ?Neck:  Supple; no masses or thyroidomegaly ?Lungs:  Respirations even and unlabored. Lungs clear to auscultation bilaterally.   No wheezes, crackles, or rhonchi.  ?Heart:  Regular rate and rhythm;  Without murmur, clicks, rubs or gallops ?Abdomen:  Soft, generalized distension , nontender. Normal bowel sounds. No appreciable masses or hepatomegaly.  No rebound or guarding.  ?Neurologic:  Alert and oriented x3;  grossly normal neurologically. ?Psych:  Alert and cooperative. Normal affect. ?Tenderness over left lower ribs in posterior axilllary line  ?LAB RESULTS: ?Recent Labs  ?  07/24/21 ?1037  ?WBC 5.9  ?HGB 13.5  ?HCT 41.7  ?PLT 154  ? ?BMET ?Recent Labs  ?  07/24/21 ?1037 07/25/21 ?0901  ?NA 137 136  ?K 4.2 4.0  ?CL 102 100  ?CO2 23 26  ?GLUCOSE 168* 139*  ?BUN 21 22  ?CREATININE 1.23 1.30*  ?  CALCIUM 8.6* 8.5*  ? ?LFT ?Recent Labs  ?  07/24/21 ?1037  ?PROT 7.2  ?ALBUMIN 4.1  ?AST 25  ?ALT 17  ?ALKPHOS 63  ?BILITOT 1.1  ?BILIDIR 0.2  ?IBILI 0.9  ? ?PT/INR ?Recent Labs  ?  07/24/21 ?1650  ?LABPROT 16.6*  ?INR 1.3*  ? ? ?STUDIES: ?CT L-SPINE NO CHARGE ? ?Result Date: 07/24/2021 ?CLINICAL DATA:  Left flank pain. EXAM: CT LUMBAR SPINE WITHOUT CONTRAST TECHNIQUE: Multidetector CT imaging of the lumbar spine was performed without intravenous contrast administration. Multiplanar CT image reconstructions were also generated. RADIATION DOSE REDUCTION: This exam was performed according to the departmental dose-optimization program which includes automated exposure control, adjustment of the mA and/or kV according to patient size and/or use of iterative reconstruction technique. COMPARISON:  CT abdomen pelvis dated 07/21/2021. FINDINGS: Segmentation: 5 lumbar type vertebrae. Alignment: Normal. Vertebrae: No acute fracture or focal pathologic process. Paraspinal and other soft tissues: Atherosclerotic disease of the abdominal aorta is noted. Disc levels: Mild-to-moderate multilevel degenerative disc and joint disease is most significant at L5-S1. IMPRESSION: Mild-to-moderate  multilevel degenerative disc and joint disease. Electronically Signed   By: Zerita Boers M.D.   On: 07/24/2021 14:24  ? ?CT Angio Chest/Abd/Pel for Dissection W and/or Wo Contrast ? ?Result Date: 07/24/2021 ?CLINICAL

## 2021-07-25 NOTE — Progress Notes (Signed)
Due to patient low BP DR Jimmye Norman said to hold am lisinopril and lasix and check BP later today and give lasix if BP has improved ?

## 2021-07-25 NOTE — Progress Notes (Signed)
Order received from Dr Williams to discontinue telemetry 

## 2021-07-26 ENCOUNTER — Inpatient Hospital Stay: Payer: Medicare Other

## 2021-07-26 ENCOUNTER — Ambulatory Visit: Payer: Medicare Other | Admitting: Family

## 2021-07-26 DIAGNOSIS — I482 Chronic atrial fibrillation, unspecified: Secondary | ICD-10-CM | POA: Diagnosis not present

## 2021-07-26 DIAGNOSIS — N200 Calculus of kidney: Secondary | ICD-10-CM | POA: Diagnosis not present

## 2021-07-26 DIAGNOSIS — R109 Unspecified abdominal pain: Secondary | ICD-10-CM | POA: Diagnosis not present

## 2021-07-26 DIAGNOSIS — K5900 Constipation, unspecified: Secondary | ICD-10-CM | POA: Diagnosis not present

## 2021-07-26 LAB — BASIC METABOLIC PANEL
Anion gap: 8 (ref 5–15)
BUN: 19 mg/dL (ref 8–23)
CO2: 27 mmol/L (ref 22–32)
Calcium: 8.4 mg/dL — ABNORMAL LOW (ref 8.9–10.3)
Chloride: 101 mmol/L (ref 98–111)
Creatinine, Ser: 1.15 mg/dL (ref 0.61–1.24)
GFR, Estimated: >60 mL/min (ref >60)
Glucose, Bld: 136 mg/dL — ABNORMAL HIGH (ref 70–99)
Potassium: 4 mmol/L (ref 3.5–5.1)
Sodium: 136 mmol/L (ref 135–145)

## 2021-07-26 LAB — CBC
HCT: 39 % (ref 39.0–52.0)
Hemoglobin: 12.7 g/dL — ABNORMAL LOW (ref 13.0–17.0)
MCH: 29.3 pg (ref 26.0–34.0)
MCHC: 32.6 g/dL (ref 30.0–36.0)
MCV: 89.9 fL (ref 80.0–100.0)
Platelets: 135 10*3/uL — ABNORMAL LOW (ref 150–400)
RBC: 4.34 MIL/uL (ref 4.22–5.81)
RDW: 14.6 % (ref 11.5–15.5)
WBC: 5.2 10*3/uL (ref 4.0–10.5)
nRBC: 0 % (ref 0.0–0.2)

## 2021-07-26 LAB — GLUCOSE, CAPILLARY
Glucose-Capillary: 127 mg/dL — ABNORMAL HIGH (ref 70–99)
Glucose-Capillary: 249 mg/dL — ABNORMAL HIGH (ref 70–99)
Glucose-Capillary: 149 mg/dL — ABNORMAL HIGH (ref 70–99)
Glucose-Capillary: 161 mg/dL — ABNORMAL HIGH (ref 70–99)

## 2021-07-26 MED ORDER — OXYCODONE HCL 5 MG PO TABS
10.0000 mg | ORAL_TABLET | ORAL | Status: DC | PRN
  Administered 2021-07-26 – 2021-07-27 (×3): 10 mg via ORAL
  Filled 2021-07-26 (×3): qty 2

## 2021-07-26 MED ORDER — BISACODYL 5 MG PO TBEC
10.0000 mg | DELAYED_RELEASE_TABLET | Freq: Every day | ORAL | Status: DC
  Administered 2021-07-26 – 2021-07-27 (×2): 10 mg via ORAL
  Filled 2021-07-26 (×2): qty 2

## 2021-07-26 MED ORDER — FLEET ENEMA 7-19 GM/118ML RE ENEM
1.0000 | ENEMA | Freq: Once | RECTAL | Status: AC
  Administered 2021-07-26: 1 via RECTAL

## 2021-07-26 MED ORDER — SODIUM CHLORIDE 0.9 % IV SOLN: INTRAVENOUS | Status: DC

## 2021-07-26 NOTE — Progress Notes (Addendum)
Mobility Specialist - Progress Note ? ? 07/26/21 1200  ?Mobility  ?Activity Ambulated with assistance in hallway  ?Level of Assistance Standby assist, set-up cues, supervision of patient - no hands on  ?Assistive Device Front wheel walker  ?Distance Ambulated (ft) 200 ft  ?Activity Response Tolerated well  ?$Mobility charge 1 Mobility  ? ? ?During mobility: 104 HR, 81-83% SpO2 ?Post-mobility: 99 HR, 93% SpO2 ? ? ?Pt lying in bed upon arrival, utilizing RA. Pt reports 6/10 L flank pain that does not increase with ambulation. Pt slightly impulsive as he is very independent-driven. SOB with exertion, O2 desat to low 80s with activity. Pt reports being a "mouth-breather" and finding it difficult to maintain PLB technique. Per spouse, pt was not using O2 PTA. 1 short rest break taken. Pt returned EOB with lunch tray set up; spouse at bedside. RN notified. ? ? ?Kathee Delton ?Mobility Specialist ?07/26/21, 12:16 PM ? ?

## 2021-07-26 NOTE — Progress Notes (Signed)
? ?Jermaine Kramer , MD ?[address], [place], [place], [place], [address] ?[address] [place], [place], [place], [address] ?Phone: [phone]  ?Fax: [phone] ? ? 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 ?Subjective: ?Not yet had a bowel movement otherwise feels well  ? ? ?Objective: ?Vital signs in last 24 hours: ?Vitals:  ? 07/25/21 1538 07/25/21 2021 07/26/21 0508 07/26/21 0734  ?BP: 102/63 118/70 119/73 103/71  ?Pulse: 78 81 75 76  ?Resp: '20 18 20 14  '$ ?Temp: 99.3 ?F (37.4 ?C) 98.6 ?F (37 ?C) 98.2 ?F (36.8 ?C) 97.8 ?F (36.6 ?C)  ?TempSrc: Oral Oral Oral Oral  ?SpO2: 94% 93% 91% 95%  ?Weight:      ?Height:      ? ?Weight change:  ? ?Intake/Output Summary (Last 24 hours) at 07/26/2021 0925 ?Last data filed at 07/25/2021 1658 ?Gross per 24 hour  ?Intake 240 ml  ?Output 400 ml  ?Net -160 ml  ? ? ? ?Exam: ? ?Abdomen: soft, nontender, normal bowel sounds ? ? ?Lab Results: ?'@LABTEST2'$ @ ?Micro Results: ?Recent Results (from the past 240 hour(s))  ?Urine Culture     Status: Abnormal  ? Collection Time: 07/24/21 10:41 AM  ? Specimen: Urine, Clean Catch  ?Result Value Ref Range Status  ? Specimen Description   Final  ?  URINE, CLEAN CATCH ?Performed at [facility], [address], [place], [place] [address] ?  ? Special Requests   Final  ?  NONE ?Performed at [facility], [address], [place], [place] [address] ?  ? Culture (A)  Final  ?  <10,000 COLONIES/mL INSIGNIFICANT GROWTH ?Performed at [facility], [place] [address] [place], [place] [address] ?  ? Report Status 07/25/2021 FINAL  Final  ? ?Studies/Results: ?CT L-SPINE NO CHARGE ? ?Result Date: 07/24/2021 ?CLINICAL DATA:  Left flank pain. EXAM: CT LUMBAR SPINE WITHOUT CONTRAST TECHNIQUE: Multidetector CT imaging of the lumbar spine was performed without intravenous contrast administration. Multiplanar CT image reconstructions were also generated. RADIATION DOSE REDUCTION: This exam was  performed according to the departmental dose-optimization program which includes automated exposure control, adjustment of the mA and/or kV according to patient size and/or use of iterative reconstruction technique. COMPARISON:  CT abdomen pelvis dated 07/21/2021. FINDINGS: Segmentation: 5 lumbar type vertebrae. Alignment: Normal. Vertebrae: No acute fracture or focal pathologic process. Paraspinal and other soft tissues: Atherosclerotic disease of the abdominal aorta is noted. Disc levels: Mild-to-moderate multilevel degenerative disc and joint disease is most significant at L5-S1. IMPRESSION: Mild-to-moderate multilevel degenerative disc and joint disease. Electronically Signed   By: Zerita Boers M.D.   On: 07/24/2021 14:24  ? ?CT Angio Chest/Abd/Pel for Dissection W and/or Wo Contrast ? ?Addendum Date: 07/25/2021   ?ADDENDUM REPORT: 07/25/2021 16:11 ADDENDUM: In the FINDINGS section, the "Other" subsection should read as follows: There is slightly increased attenuation of the mesenteric fat with mass effect on the surrounding loops of bowel, small soft tissue nodules within the mesenteric fat, and a hyperattenuating surrounding pseudo capsule. These findings are nonspecific and similar to exams dating back to 06/23/2009, but may represent sclerosing mesenteritis. A small fat containing paraumbilical hernia is noted. No significant free intraperitoneal fluid. The IMPRESSION section should read as follows: 1. No evidence of acute aortic syndromes. 2. Small bilateral pleural effusions with associated atelectasis. 3. Changes in the mesenteric fat are nonspecific but suggestive of sclerosing mesenteritis and may be an incidental finding. Aortic Atherosclerosis (  ICD10-I70.0). Electronically Signed   By: Zerita Boers M.D.   On: 07/25/2021 16:11  ? ?Result Date: 07/25/2021 ?CLINICAL DATA:  Chest pain or back pain, concern for aortic dissection. EXAM: CT ANGIOGRAPHY CHEST, ABDOMEN AND PELVIS TECHNIQUE: Non-contrast CT of  the chest was initially obtained. Multidetector CT imaging through the chest, abdomen and pelvis was performed using the standard protocol during bolus administration of intravenous contrast. Multiplanar reconstructed images and MIPs were obtained and reviewed to evaluate the vascular anatomy. RADIATION DOSE REDUCTION: This exam was performed according to the departmental dose-optimization program which includes automated exposure control, adjustment of the mA and/or kV according to patient size and/or use of iterative reconstruction technique. CONTRAST:  115m OMNIPAQUE IOHEXOL 350 MG/ML SOLN COMPARISON:  CT abdomen pelvis dated 07/21/2021, chest radiograph dated 06/22/2021. FINDINGS: CTA CHEST FINDINGS Cardiovascular: Preferential opacification of the thoracic aorta. No evidence of thoracic aortic intramural hematoma, aneurysm, or dissection. Vascular calcifications are seen in the coronary arteries. The heart is mildly enlarged. A left subclavian approach cardiac device is redemonstrated. No pericardial effusion. Mediastinum/Nodes: No enlarged mediastinal, hilar, or axillary lymph nodes. Thyroid gland, trachea, and esophagus demonstrate no significant findings. Lungs/Pleura: Small bilateral pleural effusions with associated atelectasis. Mild atelectasis of both lower lobes. No pneumothorax. Musculoskeletal: Degenerative changes are seen in the spine. Median sternotomy wires are noted. Review of the MIP images confirms the above findings. CTA ABDOMEN AND PELVIS FINDINGS VASCULAR Aorta: Atherosclerosis without aneurysm, dissection, vasculitis or significant stenosis. Celiac: Atherosclerosis at its origin without evidence of aneurysm, dissection, vasculitis or significant stenosis. SMA: Patent without evidence of aneurysm, dissection, vasculitis or significant stenosis. Renals: Mild atherosclerotic disease of the right renal artery. Both renal arteries are patent without evidence of aneurysm, dissection, vasculitis,  fibromuscular dysplasia or significant stenosis. IMA: Patent without evidence of aneurysm, dissection, vasculitis or significant stenosis. Inflow: Atherosclerosis without evidence of aneurysm, dissection, vasculitis or significant stenosis. Veins: No obvious venous abnormality within the limitations of this arterial phase study. Review of the MIP images confirms the above findings. NON-VASCULAR Hepatobiliary: No focal liver abnormality is seen. Status post cholecystectomy. No biliary dilatation. Pancreas: Unremarkable. No pancreatic ductal dilatation or surrounding inflammatory changes. Spleen: Normal in size without focal abnormality. Adrenals/Urinary Tract: Adrenal glands are unremarkable. Bilateral renal cysts measure up to 4.2 cm on the right and 2.4 cm on the left. A 2 mm nonobstructing renal calculus is seen on the left. No focal lesions on either side. No renal calculi on the right. Bladder is unremarkable. Stomach/Bowel: There is a small hiatal hernia. Appendix appears normal. No evidence of bowel wall thickening, distention, or inflammatory changes. Lymphatic: No enlarged lymph nodes in the abdomen or pelvis. Reproductive: The prostate is enlarged, measuring up to 5.4 cm in transverse dimension. Other: There is slightly increased attenuation of the mesenteric fat with mass effect on the surrounding loops of bowel, small soft tissue nodules within the mesenteric fat, and a hyperattenuating surrounding pseudo capsule. These findings are nonspecific but may represent sclerosing mesenteritis. A small fat containing paraumbilical hernia is noted. No significant free intraperitoneal fluid. Musculoskeletal: Degenerative changes are seen in the spine. Review of the MIP images confirms the above findings. IMPRESSION: 1. No evidence of acute aortic syndromes. 2. Small bilateral pleural effusions with associated atelectasis. 3. Findings suggestive of sclerosing mesenteritis. Aortic Atherosclerosis (ICD10-I70.0).  Electronically Signed: By: TZerita BoersM.D. On: 07/24/2021 14:26   ?Medications: I have reviewed the patient's current medications. ?Scheduled Meds: ? amiodarone  200 mg Oral Daily  ?  apixaban  5 mg Oral BID

## 2021-07-26 NOTE — Progress Notes (Signed)
?PROGRESS NOTE ? ? ?HPI was taken from Dr. Blaine Hamper: ?Jermaine Kramer is a 71 y.o. male with medical history significant of hypertension, hyperlipidemia, diabetes mellitus, stroke, GERD, OSA, CAD, CABG, ICD placement, CHF with EF <20%, atrial fibrillation on Eliquis, who presents with left flank pain. ?  ?Patient states that he has left flank pain for more than 4 days.  Patient was seen in ED on 3/23, and found to have 3 mm of the left nonobstructive kidney stone.  Patient was discharged home.  He states that he has worsening left flank pain today, which is constant, sharp, 8 out of 10 in severity, nonradiating.  Patient denies nausea, vomiting, diarrhea.  No fever or chills.  Patient has mild shortness of breath which is chronic issue, no significant change.  No cough, chest pain.  Patient has increased urinary frequency which he had attributed to Lasix use.  No dysuria or burning on urination. ?  ?CT-renal stone image on 07/21/21: ?1. Nonobstructing 3 mm left renal calculus. No obstructive uropathy within either kidney. ?2. Otherwise no acute intra-abdominal or intrapelvic process. ?3. Bilateral renal cortical atrophy with simple appearing upper pole bilateral renal cysts. ?4. Small hiatal hernia. ?5.  Aortic Atherosclerosis (ICD10-I70.0). ?  ?Data Reviewed and ED Course: pt was found to have WBC 5.9, lipase 30, urinalysis (clear appearance, small amount of leukocyte, negative bacteria, WBC 11-50), GFR > 60, temperature normal, blood pressure 123/72, heart rate 88, RR 21, oxygen saturation 98% on room air.  CT of lumbar spine is negative for acute injury, but showed degenerative disc disease.  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  Patient is admitted to [facility] bed as inpatient.  Dr. Vicente Males of GI is consulted. ?  ?CTA: ?1. No evidence of acute aortic syndromes. ?2. Small bilateral pleural effusions with associated atelectasis. ?3. Findings suggestive of sclerosing  mesenteritis.\ ?4. Adrenals/Urinary Tract: Adrenal glands are unremarkable. Bilateral ?renal cysts measure up to 4.2 cm on the right and 2.4 cm on the ?left. A 2 mm nonobstructing renal calculus is seen on the left. No ?focal lesions on either side. No renal calculi on the right. Bladder ?is unremarkable. ?  ? ? DOMINIC MAHANEY  GHW:[id number] DOB: [birth year] DOA: 07/24/2021 ?PCP: Sherre Scarlet, PA-C  ? ?Assessment & Plan: ?  ?Principal Problem: ?  Left flank pain ?Active Problems: ?  Type 2 diabetes mellitus with complication, without long-term current use of insulin ([name]) ?  Hypertension ?  CAD (coronary artery disease) ?  Hyperlipidemia LDL goal <70 ?  Stroke [facility]) ?  Chronic combined systolic and diastolic congestive heart failure ([name]) ?  Atrial fibrillation, chronic ([name]) ?  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  Kidney stone on left side ? ? ?Left flank pain: CT showed possible sclerosing mesenteritis& has small left kidney stone which may have contributed partially. Morphine, percocet prn for pain. 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 Windell Moment after he discussed w/ radiologist. (This CT finding was discussed w/ Dr. Windell Moment but he did not need to see the pt.) Pt will f/u outpatient w/ GI, Dr. Vicente Males. Pt verbalized his understanding  ? ?Nephrolithiasis: 3 mm nonobstructive stone. W/ significant left flank pain. Continue on flomax. Morphine, oxycodone prn for pain. Urine cx shows insignificant growth. Urology consulted (Dr. Bernardo Heater) ? ?Thrombocytopenia: etiology unclear. Will continue to monitor ?  ?DM2: HbA1c 7.8, poorly controlled. Continue on SSI w/ accuchecks  ?  ?  HTN: continue on lisinopril, metoprolol, lasix. IV hydralazine  ? ?Hx of CAD: s/p CABG. Continue on statin, aspirin  ? ?HLD: continue on statin  ?  ?Hx of CVA: continue on aspirin, statin, eliquis  ?  ?Chronic combined CHF: compensated. Continue on lasix. Monitor I/Os  ?   ?Chronic a. fib: continue on amiodarone, metoprolol, eliquis  ? ? ?DVT prophylaxis: eliquis ?Code Status: full  ?Family Communication: discussed pt's care w/ pt's family at bedside and answered their questions  ?Disposition Plan: likely d/c back home  ? ?Level of care: Med-Surg ? ?Status is: Inpatient ?Remains inpatient appropriate because: urology consulted for significant left flank pain possibly from kidney stone  ? ? ? ?Consultants:  ?GI ?Urology  ? ?Procedures:  ? ?Antimicrobials:  ? ? ?Subjective: ?Pt c/o significant left flank pain  ? ?Objective: ?Vitals:  ? 07/25/21 1538 07/25/21 2021 07/26/21 4132 07/26/21 0734  ?BP: 102/63 118/70 119/73 103/71  ?Pulse: 78 81 75 76  ?Resp: '20 18 20 14  '$ ?Temp: 99.3 ?F (37.4 ?C) 98.6 ?F (37 ?C) 98.2 ?F (36.8 ?C) 97.8 ?F (36.6 ?C)  ?TempSrc: Oral Oral Oral Oral  ?SpO2: 94% 93% 91% 95%  ?Weight:      ?Height:      ? ? ?Intake/Output Summary (Last 24 hours) at 07/26/2021 1312 ?Last data filed at 07/26/2021 1050 ?Gross per 24 hour  ?Intake 240 ml  ?Output 1100 ml  ?Net -860 ml  ? ?Filed Weights  ? 07/24/21 1025  ?Weight: 86 kg  ? ? ?Examination: ? ?General exam: appears calm but uncomfortable  ?Respiratory system: clear breath sounds b/l ?Cardiovascular system:S1/S2+. No rubs or gallops  ?Gastrointestinal system: Abd is soft, NT, obese & hypoactive bowel sounds  ?Central nervous system: Alert and oriented. Moves all extremities  ?Psychiatry: Judgement and insight appears normal. Flat mood and affect  ? ? ? ?Data Reviewed: I have personally reviewed following labs and imaging studies ? ?CBC: ?Recent Labs  ?Lab 07/21/21 ?1701 07/24/21 ?1037 07/26/21 ?0533  ?WBC 5.6 5.9 5.2  ?HGB 13.0 13.5 12.7*  ?HCT 39.8 41.7 39.0  ?MCV 90.0 90.3 89.9  ?PLT 164 154 135*  ? ?Basic Metabolic Panel: ?Recent Labs  ?Lab 07/21/21 ?1701 07/24/21 ?1037 07/25/21 ?0901 07/26/21 ?0533  ?NA 138 137 136 136  ?K 3.9 4.2 4.0 4.0  ?CL 102 102 100 101  ?CO2 '26 23 26 27  '$ ?GLUCOSE 166* 168* 139* 136*  ?BUN '23 21 22  19  '$ ?CREATININE 1.28* 1.23 1.30* 1.15  ?CALCIUM 8.4* 8.6* 8.5* 8.4*  ? ?GFR: ?Estimated Creatinine Clearance: 61.9 mL/min (by C-G formula based on SCr of 1.15 mg/dL). ?Liver Function Tests: ?Recent Labs  ?Lab 07/24/21 ?1037  ?AST 25  ?ALT 17  ?ALKPHOS 63  ?BILITOT 1.1  ?PROT 7.2  ?ALBUMIN 4.1  ? ?Recent Labs  ?Lab 07/24/21 ?1037  ?LIPASE 30  ? ?No results for input(s): AMMONIA in the last 168 hours. ?Coagulation Profile: ?Recent Labs  ?Lab 07/24/21 ?1650  ?INR 1.3*  ? ?Cardiac Enzymes: ?No results for input(s): CKTOTAL, CKMB, CKMBINDEX, TROPONINI in the last 168 hours. ?BNP (last 3 results) ?No results for input(s): PROBNP in the last 8760 hours. ?HbA1C: ?No results for input(s): HGBA1C in the last 72 hours. ?CBG: ?Recent Labs  ?Lab 07/25/21 ?1213 07/25/21 ?1704 07/25/21 ?2021 07/26/21 ?4401 07/26/21 ?1221  ?GLUCAP 196* 230* 181* 127* 249*  ? ?Lipid Profile: ?No results for input(s): CHOL, HDL, LDLCALC, TRIG, CHOLHDL, LDLDIRECT in the last 72 hours. ?Thyroid Function Tests: ?No results  for input(s): TSH, T4TOTAL, FREET4, T3FREE, THYROIDAB in the last 72 hours. ?Anemia Panel: ?No results for input(s): VITAMINB12, FOLATE, FERRITIN, TIBC, IRON, RETICCTPCT in the last 72 hours. ?Sepsis Labs: ?No results for input(s): PROCALCITON, LATICACIDVEN in the last 168 hours. ? ?Recent Results (from the past 240 hour(s))  ?Urine Culture     Status: Abnormal  ? Collection Time: 07/24/21 10:41 AM  ? Specimen: Urine, Clean Catch  ?Result Value Ref Range Status  ? Specimen Description   Final  ?  URINE, CLEAN CATCH ?Performed at [facility], [address], [place], [place] [address] ?  ? Special Requests   Final  ?  NONE ?Performed at [facility], [address], [place], [place] [address] ?  ? Culture (A)  Final  ?  <10,000 COLONIES/mL INSIGNIFICANT GROWTH ?Performed at [facility], [place] [address], [place], [place] [address] ?  ? Report Status 07/25/2021 FINAL  Final  ?  ? ? ? ? ? ?Radiology Studies: ?CT  L-SPINE NO CHARGE ? ?Result Date: 07/24/2021 ?CLINICAL DATA:  Left flank pain. EXAM: CT LUMBAR SPINE WITHOUT CONTRAST TECHNIQUE: Multidetector CT imaging of the lumbar spine was performed without intravenous con

## 2021-07-26 NOTE — Consult Note (Signed)
? ?Urology Consult ? ?Requesting physician: Eppie Gibson, MD ? ?Reason for consultation:?  Left flank pain from 3 mm kidney stone ? ? ?History of Present Illness: Jermaine Kramer is a 71 y.o. male who initially presented to [facility] ED 07/21/2021 with complaints of left flank and left lower quadrant abdominal pain.  CT showed a nonobstructing left renal calculus however his pain was felt to be more musculoskeletal in etiology. ? ?He returned to the ED 07/24/2021 with persistent pain.  CTA was performed which was felt to show findings suggestive of sclerosing mesenteritis. ? ?He was admitted for pain control and further evaluation. ? ?Prior history of stone disease though his wife states his pain has not been characteristic of typical stone pain ? ?Patient states his pain is just above his left iliac crest anteriorly and is worse with standing.  No nausea, vomiting, fever or chills.  Denies gross hematuria.  Urinalysis showed mild pyuria and no microhematuria.  Urine culture grew mixed flora.  History of BPH on tamsulosin ? ? ?Past Medical History:  ?Diagnosis Date  ? Arrhythmia   ? atrial fibrillation  ? CHF (congestive heart failure) ([place])   ? Coronary artery disease   ? Diabetes mellitus without complication ([place])   ? GERD (gastroesophageal reflux disease)   ? Hypercholesteremia   ? Hypertension   ? Sleep apnea   ? Stroke [facility])   ? ? ?Past Surgical History:  ?Procedure Laterality Date  ? CHOLECYSTECTOMY    ? COLONOSCOPY WITH PROPOFOL N/A 04/14/2019  ? Procedure: COLONOSCOPY WITH PROPOFOL;  Surgeon: Toledo, Benay Pike, MD;  Location: [facility] ENDOSCOPY;  Service: Gastroenterology;  Laterality: N/A;  ? CORONARY ARTERY BYPASS GRAFT  02/16/2021  ? ICD IMPLANT N/A 06/22/2021  ? Procedure: ICD IMPLANT;  Surgeon: Vickie Epley, MD;  Location: [place] CV LAB;  Service: Cardiovascular;  Laterality: N/A;  ? KNEE ARTHROSCOPY    ? RENAL CYST EXCISION    ? ? ?Home Medications:  ?Current Meds  ?Medication Sig  ? amiodarone  (PACERONE) 200 MG tablet Take 200 mg by mouth daily.  ? apixaban (ELIQUIS) 5 MG TABS tablet Take 1 tablet (5 mg total) by mouth 2 (two) times daily. DO NOT RESTART UNTIL 06/27/2021.  ? aspirin EC 81 MG tablet Take 81 mg by mouth daily.  ? atorvastatin (LIPITOR) 80 MG tablet Take 80 mg by mouth daily.  ? furosemide (LASIX) 40 MG tablet Take 1 tablet (40 mg total) by mouth daily. (Patient taking differently: Take 20 mg by mouth daily.)  ? lisinopril (ZESTRIL) 2.5 MG tablet Take 1 tablet (2.5 mg total) by mouth daily.  ? metFORMIN (GLUCOPHAGE) 1000 MG tablet Take 1,000 mg by mouth 2 (two) times daily with a meal.  ? metoprolol succinate (TOPROL-XL) 25 MG 24 hr tablet Take 1.5 tablets (37.5 mg total) by mouth daily. (Patient taking differently: Take 12.5 mg by mouth daily.)  ? omeprazole (PRILOSEC) 40 MG capsule Take 40 mg by mouth daily.  ? oxyCODONE-acetaminophen (PERCOCET) 5-325 MG tablet Take 1 tablet by mouth every 4 (four) hours as needed for severe pain.  ? tamsulosin (FLOMAX) 0.4 MG CAPS capsule Take 0.8 mg by mouth daily.  ? ? ?Allergies:  ?Allergies  ?Allergen Reactions  ? Doxycycline Anaphylaxis  ?  Patient does not recall having this reaction.  ?  ? Heparin Other (See Comments)  ?  heparin-induced thrombocytopenia ?  ? Empagliflozin Itching  ?  Groin infection ?Caused a groin infection.  ?  ? ? ?  Family History  ?Problem Relation Age of Onset  ? Parkinson's disease Mother   ? Lupus Mother   ? Heart disease Father   ? ? ?Social History:  reports that he has never smoked. He has never used smokeless tobacco. He reports that he does not currently use alcohol. He reports that he does not use drugs. ? ?ROS: ?Noncontributory septa as per the HPI ? ?Physical Exam:  ?Vital signs in last 24 hours: ?Temp:  [97.7 ?F (36.5 ?C)-98.6 ?F (37 ?C)] 97.7 ?F (36.5 ?C) (03/28 1551) ?Pulse Rate:  [75-87] 87 (03/28 1551) ?Resp:  [14-20] 16 (03/28 1551) ?BP: (103-127)/(70-80) 127/80 (03/28 1551) ?SpO2:  [91 %-95 %] 95 % (03/28  1551) ?Constitutional:  Alert and oriented, No acute distress ?HEENT: [place] AT, moist mucus membranes.  Trachea midline, no masses ?GI: Abdomen is soft, nontender, nondistended, no abdominal masses ?GU: No CVA tenderness ?Psychiatric: Normal mood and affect ? ? ?Laboratory Data:  ?Recent Labs  ?  07/24/21 ?1037 07/26/21 ?0533  ?WBC 5.9 5.2  ?HGB 13.5 12.7*  ?HCT 41.7 39.0  ? ?Recent Labs  ?  07/24/21 ?1037 07/25/21 ?0901 07/26/21 ?0533  ?NA 137 136 136  ?K 4.2 4.0 4.0  ?CL 102 100 101  ?CO2 '23 26 27  '$ ?GLUCOSE 168* 139* 136*  ?BUN '21 22 19  '$ ?CREATININE 1.23 1.30* 1.15  ?CALCIUM 8.6* 8.5* 8.4*  ? ?Recent Labs  ?  07/24/21 ?1650  ?INR 1.3*  ? ?No results for input(s): LABURIN in the last 72 hours. ?Results for orders placed or performed during the hospital encounter of 07/24/21  ?Urine Culture     Status: Abnormal  ? Collection Time: 07/24/21 10:41 AM  ? Specimen: Urine, Clean Catch  ?Result Value Ref Range Status  ? Specimen Description   Final  ?  URINE, CLEAN CATCH ?Performed at [facility], [address] [place], [place] [address] ?  ? Special Requests   Final  ?  NONE ?Performed at [facility], [address], [place], [place] [address] ?  ? Culture (A)  Final  ?  <10,000 COLONIES/mL INSIGNIFICANT GROWTH ?Performed at [facility], [place] [address] [place] [address], [place], [place] [address] ?  ? Report Status 07/25/2021 FINAL  Final  ? ? ? ?Radiologic Imaging: ?CT images were personally reviewed and interpreted.  He has a nonobstructing left renal calculus which may be intraparenchymal.  No hydronephrosis, ureteral dilation or ureteral calculi ? ?Impression/Recommendation:  ? ?1.  Left nephrolithiasis ?Small, nonobstructing left renal calculus which would not be a cause of his moderate-severe pain.  There are no findings to suggest a ureteral calculus ?No treatment none of his left renal calculus is needed at this time and would recommend a 1 year follow-up with [facility] ? ? ? ?07/26/2021, 5:08 PM  ?John Giovanni,  MD ? ? ? ?

## 2021-07-27 ENCOUNTER — Inpatient Hospital Stay: Payer: Medicare Other

## 2021-07-27 DIAGNOSIS — I10 Essential (primary) hypertension: Secondary | ICD-10-CM | POA: Diagnosis not present

## 2021-07-27 DIAGNOSIS — R109 Unspecified abdominal pain: Secondary | ICD-10-CM | POA: Diagnosis not present

## 2021-07-27 DIAGNOSIS — E118 Type 2 diabetes mellitus with unspecified complications: Secondary | ICD-10-CM | POA: Diagnosis not present

## 2021-07-27 LAB — CBC
HCT: 35.9 % — ABNORMAL LOW (ref 39.0–52.0)
Hemoglobin: 12 g/dL — ABNORMAL LOW (ref 13.0–17.0)
MCH: 29.7 pg (ref 26.0–34.0)
MCHC: 33.4 g/dL (ref 30.0–36.0)
MCV: 88.9 fL (ref 80.0–100.0)
Platelets: 138 10*3/uL — ABNORMAL LOW (ref 150–400)
RBC: 4.04 MIL/uL — ABNORMAL LOW (ref 4.22–5.81)
RDW: 14.6 % (ref 11.5–15.5)
WBC: 5 10*3/uL (ref 4.0–10.5)
nRBC: 0 % (ref 0.0–0.2)

## 2021-07-27 LAB — BASIC METABOLIC PANEL
Anion gap: 6 (ref 5–15)
BUN: 16 mg/dL (ref 8–23)
CO2: 28 mmol/L (ref 22–32)
Calcium: 8.3 mg/dL — ABNORMAL LOW (ref 8.9–10.3)
Chloride: 103 mmol/L (ref 98–111)
Creatinine, Ser: 1.23 mg/dL (ref 0.61–1.24)
GFR, Estimated: >60 mL/min (ref >60)
Glucose, Bld: 138 mg/dL — ABNORMAL HIGH (ref 70–99)
Potassium: 3.7 mmol/L (ref 3.5–5.1)
Sodium: 137 mmol/L (ref 135–145)

## 2021-07-27 LAB — GLUCOSE, CAPILLARY
Glucose-Capillary: 140 mg/dL — ABNORMAL HIGH (ref 70–99)
Glucose-Capillary: 134 mg/dL — ABNORMAL HIGH (ref 70–99)

## 2021-07-27 MED ORDER — OXYCODONE HCL 10 MG PO TABS: 10.0000 mg | ORAL_TABLET | Freq: Four times a day (QID) | ORAL | 0 refills | Status: AC | PRN

## 2021-07-27 NOTE — Plan of Care (Signed)
?  Problem: Education: ?Goal: Knowledge of General Education information will improve ?Description: Including pain rating scale, medication(s)/side effects and non-pharmacologic comfort measures ?07/27/2021 1237 by Evelena Peat, RN ?Outcome: Completed/Met ?07/27/2021 1039 by Evelena Peat, RN ?Outcome: Progressing ?  ?Problem: Health Behavior/Discharge Planning: ?Goal: Ability to manage health-related needs will improve ?07/27/2021 1237 by Evelena Peat, RN ?Outcome: Completed/Met ?07/27/2021 1039 by Evelena Peat, RN ?Outcome: Progressing ?  ?Problem: Clinical Measurements: ?Goal: Ability to maintain clinical measurements within normal limits will improve ?07/27/2021 1237 by Evelena Peat, RN ?Outcome: Completed/Met ?07/27/2021 1039 by Evelena Peat, RN ?Outcome: Progressing ?Goal: Will remain free from infection ?07/27/2021 1237 by Evelena Peat, RN ?Outcome: Completed/Met ?07/27/2021 1039 by Evelena Peat, RN ?Outcome: Progressing ?Goal: Diagnostic test results will improve ?07/27/2021 1237 by Evelena Peat, RN ?Outcome: Completed/Met ?07/27/2021 1039 by Evelena Peat, RN ?Outcome: Progressing ?Goal: Respiratory complications will improve ?07/27/2021 1237 by Evelena Peat, RN ?Outcome: Completed/Met ?07/27/2021 1039 by Evelena Peat, RN ?Outcome: Progressing ?Goal: Cardiovascular complication will be avoided ?07/27/2021 1237 by Evelena Peat, RN ?Outcome: Completed/Met ?07/27/2021 1039 by Evelena Peat, RN ?Outcome: Progressing ?  ?Problem: Activity: ?Goal: Risk for activity intolerance will decrease ?07/27/2021 1237 by Evelena Peat, RN ?Outcome: Completed/Met ?07/27/2021 1039 by Evelena Peat, RN ?Outcome: Progressing ?  ?Problem: Nutrition: ?Goal: Adequate nutrition will be maintained ?07/27/2021 1237 by Evelena Peat, RN ?Outcome: Completed/Met ?07/27/2021 1039 by Evelena Peat, RN ?Outcome: Progressing ?  ?Problem: Coping: ?Goal: Level  of anxiety will decrease ?07/27/2021 1237 by Evelena Peat, RN ?Outcome: Completed/Met ?07/27/2021 1039 by Evelena Peat, RN ?Outcome: Progressing ?  ?Problem: Elimination: ?Goal: Will not experience complications related to bowel motility ?07/27/2021 1237 by Evelena Peat, RN ?Outcome: Completed/Met ?07/27/2021 1039 by Evelena Peat, RN ?Outcome: Progressing ?Goal: Will not experience complications related to urinary retention ?07/27/2021 1237 by Evelena Peat, RN ?Outcome: Completed/Met ?07/27/2021 1039 by Evelena Peat, RN ?Outcome: Progressing ?  ?Problem: Pain Managment: ?Goal: General experience of comfort will improve ?07/27/2021 1237 by Evelena Peat, RN ?Outcome: Completed/Met ?07/27/2021 1039 by Evelena Peat, RN ?Outcome: Progressing ?  ?Problem: Safety: ?Goal: Ability to remain free from injury will improve ?07/27/2021 1237 by Evelena Peat, RN ?Outcome: Completed/Met ?07/27/2021 1039 by Evelena Peat, RN ?Outcome: Progressing ?  ?Problem: Skin Integrity: ?Goal: Risk for impaired skin integrity will decrease ?07/27/2021 1237 by Evelena Peat, RN ?Outcome: Completed/Met ?07/27/2021 1039 by Evelena Peat, RN ?Outcome: Progressing ?  ?

## 2021-07-27 NOTE — TOC Transition Note (Signed)
Transition of Care (TOC) - CM/SW Discharge Note ? ? ?Patient Details  ?Name: Jermaine Kramer ?MRN: [id number] ?Date of Birth: 1950-05-31 ? ?Transition of Care (TOC) CM/SW Contact:  ?Candie Chroman, LCSW ?Phone Number: ?07/27/2021, 12:32 PM ? ? ?Clinical Narrative:  Patient has orders to discharge home today. Readmission prevention screen complete. CSW met with patient. Wife at bedside. CSW introduced role and explained that discharge planning would be discussed. PCP is Mychal [name], PA-C at [facility] in [place]. Wife drives him to appointments. [facility]acy [facility] [place]. No issues obtaining medications. Patient was recently discharged from [facility] and started cardiac rehab last Tuesday. No DME use at home. No further concerns. Wife will take him home today. CSW signing off.  ? ?Final next level of care: Home/Self Care ?Barriers to Discharge: No Barriers Identified ? ? ?Patient Goals and CMS Choice ?  ?  ?  ? ?Discharge Placement ?  ?           ?  ?Patient to be transferred to facility by: Wife ?Name of family member notified: Jermaine Kramer ?Patient and family notified of of transfer: 07/27/21 ? ?Discharge Plan and Services ?  ?  ?           ?  ?  ?  ?  ?  ?  ?  ?  ?  ?  ? ?Social Determinants of Health (SDOH) Interventions ?  ? ? ?Readmission Risk Interventions ? ?  07/27/2021  ? 12:31 PM  ?Readmission Risk Prevention Plan  ?Transportation Screening Complete  ?PCP or Specialist Appt within 3-5 Days Complete  ?Social Work Consult for [place] Planning/Counseling Complete  ?Palliative Care Screening Not Applicable  ?Medication Review Press photographer) Complete  ? ? ? ? ? ?

## 2021-07-27 NOTE — Care Management Important Message (Signed)
Important Message ? ?Patient Details  ?Name: Jermaine Kramer ?MRN: [id number] ?Date of Birth: 1950/09/26 ? ? ?Medicare Important Message Given:  Yes ? ? ? ? ?Dannette Barbara ?07/27/2021, 11:19 AM ?

## 2021-07-27 NOTE — Discharge Summary (Signed)
Physician Discharge Summary  ?Jermaine Kramer:[id number] DOB: 12/18/1950 DOA: 07/24/2021 ? ?PCP: Sherre Scarlet, PA-C ? ?Admit date: 07/24/2021 ?Discharge date: 07/27/2021 ? ?Admitted From: home  ?Disposition:  home  ? ?Recommendations for Outpatient Follow-up:  ?Follow up with PCP in 1 week  ?F/u w/ GI, Dr. Vicente Males, in 1-2 weeks ? ? ?Home Health:  ?Equipment/Devices: ? ?Discharge Condition: stable  ?CODE STATUS: full  ?Diet recommendation: Heart Healthy / Carb Modified  ? ?Brief/Interim Summary: ?HPI was taken from Dr. Blaine Hamper: ?Jermaine Kramer is a 71 y.o. male with medical history significant of hypertension, hyperlipidemia, diabetes mellitus, stroke, GERD, OSA, CAD, CABG, ICD placement, CHF with EF <20%, atrial fibrillation on Eliquis, who presents with left flank pain. ?  ?Patient states that he has left flank pain for more than 4 days.  Patient was seen in ED on 3/23, and found to have 3 mm of the left nonobstructive kidney stone.  Patient was discharged home.  He states that he has worsening left flank pain today, which is constant, sharp, 8 out of 10 in severity, nonradiating.  Patient denies nausea, vomiting, diarrhea.  No fever or chills.  Patient has mild shortness of breath which is chronic issue, no significant change.  No cough, chest pain.  Patient has increased urinary frequency which he had attributed to Lasix use.  No dysuria or burning on urination. ?  ?CT-renal stone image on 07/21/21: ?1. Nonobstructing 3 mm left renal calculus. No obstructive uropathy within either kidney. ?2. Otherwise no acute intra-abdominal or intrapelvic process. ?3. Bilateral renal cortical atrophy with simple appearing upper pole bilateral renal cysts. ?4. Small hiatal hernia. ?5.  Aortic Atherosclerosis (ICD10-I70.0). ?  ?Data Reviewed and ED Course: pt was found to have WBC 5.9, lipase 30, urinalysis (clear appearance, small amount of leukocyte, negative bacteria, WBC 11-50), GFR > 60, temperature normal, blood pressure  123/72, heart rate 88, RR 21, oxygen saturation 98% on room air.  CT of lumbar spine is negative for acute injury, but showed degenerative disc disease.  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  Patient is admitted to [facility] bed as inpatient.  Dr. Vicente Males of GI is consulted. ?  ?CTA: ?1. No evidence of acute aortic syndromes. ?2. Small bilateral pleural effusions with associated atelectasis. ?3. Findings suggestive of sclerosing mesenteritis.\ ?4. Adrenals/Urinary Tract: Adrenal glands are unremarkable. Bilateral ?renal cysts measure up to 4.2 cm on the right and 2.4 cm on the ?left. A 2 mm nonobstructing renal calculus is seen on the left. No ?focal lesions on either side. No renal calculi on the right. Bladder ?is unremarkable. ? ?Hospital course as per Dr. Jimmye Norman 3/27-3/29/23: Pt presented w/ left flank pain of unknown etiology. Initially pt was thought to be secondary to sclerosing mesenteritis as noted on CT scan but this has been present and stable at least since 2011 as per gen surg, Dr. Windell Moment after he discussed w/ radiologist. Of note, pt was also found to have 57m nonobstructive stone for which urology said nothing inpatient to do about this stone currently. The left flank pain was likely MSK in etiology. Pt was d/c home w/ a couple of days of narcotics. For information, please see previous progress/consult notes.  ? ?Discharge Diagnoses:  ?Principal Problem: ?  Left flank pain ?Active Problems: ?  Type 2 diabetes mellitus with complication, without long-term current use of insulin ([place] ?  Hypertension ?  CAD (coronary artery disease) ?  Hyperlipidemia LDL goal <  80 ?  Stroke [facility]) ?  Chronic combined systolic and diastolic congestive heart failure ([place]) ?  Atrial fibrillation, chronic ([place]) ?  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  Kidney stone on left side ? ? ?Left flank pain: CT showed possible sclerosing mesenteritis& has small left kidney stone  which may have contributed partially. Morphine, percocet prn for pain. 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 Windell Moment after he discussed w/ radiologist. (This CT finding was discussed w/ Dr. Windell Moment but he did not need to see the pt.) Pt will f/u outpatient w/ GI, Dr. Vicente Males. Pt verbalized his understanding  ? ?Nephrolithiasis: 3 mm nonobstructive stone. W/ significant left flank pain. Continue on flomax. Morphine, oxycodone prn for pain. Urine cx shows insignificant growth. Nothing to do from the urologic standpoint inpatient  ? ?Thrombocytopenia: etiology unclear. Will continue to monitor ?  ?DM2: HbA1c 7.8, poorly controlled. Continue on SSI w/ accuchecks  ?  ?HTN: continue on lisinopril, metoprolol, lasix. IV hydralazine  ? ?Hx of CAD: s/p CABG. Continue on statin, aspirin  ? ?HLD: continue on statin  ?  ?Hx of CVA: continue on aspirin, statin, eliquis  ?  ?Chronic combined CHF: compensated. Continue on lasix. Monitor I/Os  ?  ?Chronic a. fib: continue on amiodarone, metoprolol, eliquis  ? ?Discharge Instructions ? ?Discharge Instructions   ? ? Diet - low sodium heart healthy   Complete by: As directed ?  ? Diet Carb Modified   Complete by: As directed ?  ? Discharge instructions   Complete by: As directed ?  ? F/u w/ PCP in 1 week. F/u w/ GI, Dr. Vicente Males, in 1-2 weeks  ? Increase activity slowly   Complete by: As directed ?  ? ?  ? ?Allergies as of 07/27/2021   ? ?   Reactions  ? Doxycycline Anaphylaxis  ? Patient does not recall having this reaction.   ? Heparin Other (See Comments)  ? heparin-induced thrombocytopenia  ? Empagliflozin Itching  ? Groin infection ?Caused a groin infection.   ? ?  ? ?  ?Medication List  ?  ? ?STOP taking these medications   ? ?oxyCODONE-acetaminophen 5-325 MG tablet ?Commonly known as: Percocet ?  ? ?  ? ?TAKE these medications   ? ?amiodarone 200 MG tablet ?Commonly known as: PACERONE ?Take 200 mg by mouth  daily. ?  ?apixaban 5 MG Tabs tablet ?Commonly known as: ELIQUIS ?Take 1 tablet (5 mg total) by mouth 2 (two) times daily. DO NOT RESTART UNTIL 06/27/2021. ?  ?aspirin EC 81 MG tablet ?Take 81 mg by mouth daily. ?  ?atorvastatin 80 MG tablet ?Commonly known as: LIPITOR ?Take 80 mg by mouth daily. ?  ?furosemide 40 MG tablet ?Commonly known as: LASIX ?Take 1 tablet (40 mg total) by mouth daily. ?What changed: how much to take ?  ?lisinopril 2.5 MG tablet ?Commonly known as: ZESTRIL ?Take 1 tablet (2.5 mg total) by mouth daily. ?  ?metFORMIN 1000 MG tablet ?Commonly known as: GLUCOPHAGE ?Take 1,000 mg by mouth 2 (two) times daily with a meal. ?  ?metoprolol succinate 25 MG 24 hr tablet ?Commonly known as: TOPROL-XL ?Take 1.5 tablets (37.5 mg total) by mouth daily. ?What changed: how much to take ?  ?omeprazole 40 MG capsule ?Commonly known as: PRILOSEC ?Take 40 mg by mouth daily. ?  ?Oxycodone HCl 10 MG Tabs ?Take 1 tablet (10 mg total) by mouth every 6 (six) hours as needed for  up to 3 days for moderate pain or severe pain. ?  ?Precision QID Test test strip ?Generic drug: glucose blood ?Use 3 (three) times daily Use as instructed. One touch ultra ?  ?tamsulosin 0.4 MG Caps capsule ?Commonly known as: FLOMAX ?Take 0.8 mg by mouth daily. ?  ? ?  ? ? ?Allergies  ?Allergen Reactions  ? Doxycycline Anaphylaxis  ?  Patient does not recall having this reaction.  ?  ? Heparin Other (See Comments)  ?  heparin-induced thrombocytopenia ?  ? Empagliflozin Itching  ?  Groin infection ?Caused a groin infection.  ?  ? ? ?Consultations: ?Urology: Dr. Bernardo Heater ?GI: Dr. Vicente Males  ? ? ?Procedures/Studies: ?CT L-SPINE NO CHARGE ? ?Result Date: 07/24/2021 ?CLINICAL DATA:  Left flank pain. EXAM: CT LUMBAR SPINE WITHOUT CONTRAST TECHNIQUE: Multidetector CT imaging of the lumbar spine was performed without intravenous contrast administration. Multiplanar CT image reconstructions were also generated. RADIATION DOSE REDUCTION: This exam was  performed according to the departmental dose-optimization program which includes automated exposure control, adjustment of the mA and/or kV according to patient size and/or use of iterative reconstruction technique. COMPA

## 2021-07-27 NOTE — Plan of Care (Signed)

## 2021-08-01 ENCOUNTER — Encounter: Payer: Medicare Other | Attending: Internal Medicine | Admitting: *Deleted

## 2021-08-01 DIAGNOSIS — I4891 Unspecified atrial fibrillation: Secondary | ICD-10-CM | POA: Insufficient documentation

## 2021-08-01 DIAGNOSIS — Z8673 Personal history of transient ischemic attack (TIA), and cerebral infarction without residual deficits: Secondary | ICD-10-CM | POA: Insufficient documentation

## 2021-08-01 DIAGNOSIS — Z951 Presence of aortocoronary bypass graft: Secondary | ICD-10-CM | POA: Insufficient documentation

## 2021-08-01 DIAGNOSIS — Z7901 Long term (current) use of anticoagulants: Secondary | ICD-10-CM | POA: Diagnosis not present

## 2021-08-01 DIAGNOSIS — Z8549 Personal history of malignant neoplasm of other male genital organs: Secondary | ICD-10-CM | POA: Diagnosis not present

## 2021-08-01 DIAGNOSIS — I493 Ventricular premature depolarization: Secondary | ICD-10-CM | POA: Insufficient documentation

## 2021-08-01 DIAGNOSIS — Z7984 Long term (current) use of oral hypoglycemic drugs: Secondary | ICD-10-CM | POA: Insufficient documentation

## 2021-08-01 DIAGNOSIS — I10 Essential (primary) hypertension: Secondary | ICD-10-CM | POA: Diagnosis not present

## 2021-08-01 DIAGNOSIS — I2510 Atherosclerotic heart disease of native coronary artery without angina pectoris: Secondary | ICD-10-CM | POA: Insufficient documentation

## 2021-08-01 DIAGNOSIS — Z7985 Long-term (current) use of injectable non-insulin antidiabetic drugs: Secondary | ICD-10-CM | POA: Diagnosis not present

## 2021-08-01 DIAGNOSIS — Z79899 Other long term (current) drug therapy: Secondary | ICD-10-CM | POA: Insufficient documentation

## 2021-08-01 DIAGNOSIS — I420 Dilated cardiomyopathy: Secondary | ICD-10-CM | POA: Insufficient documentation

## 2021-08-01 DIAGNOSIS — E119 Type 2 diabetes mellitus without complications: Secondary | ICD-10-CM | POA: Diagnosis not present

## 2021-08-01 LAB — GLUCOSE, CAPILLARY
Glucose-Capillary: 218 mg/dL — ABNORMAL HIGH (ref 70–99)
Glucose-Capillary: 177 mg/dL — ABNORMAL HIGH (ref 70–99)

## 2021-08-01 NOTE — Progress Notes (Signed)
Daily Session Note ? ?Patient Details  ?Name: Jermaine Kramer ?MRN: [id number] ?Date of Birth: 01-19-1951 ?Referring Provider:   ?Flowsheet Row Cardiac Rehab from 07/19/2021 in [facility] Cardiac [facility]  ?Referring Provider Ernesta Amble MD  ? ?  ? ? ?Encounter Date: 08/01/2021 ? ?Check In: ? Session Check In - 08/01/21 1045   ? ?  ? Check-In  ? Supervising physician immediately available to respond to emergencies See telemetry face sheet for immediately available ER MD   ? Location [name]   ? Staff Present Renita Papa, RN Moises Blood, BS, ACSM CEP, Exercise Physiologist;Amanda Oletta Darter, BA, ACSM CEP, Exercise Physiologist;Kelly Rosalia Hammers, MPA, RN   ? Virtual Visit No   ? Medication changes reported     No   ? Fall or balance concerns reported    No   ? Warm-up and Cool-down Performed on first and last piece of equipment   ? Resistance Training Performed Yes   ? VAD Patient? No   ? PAD/SET Patient? No   ?  ? Pain Assessment  ? Currently in Pain? No/denies   ? ?  ?  ? ?  ? ? ? ? ? ?Social History  ? ?Tobacco Use  ?Smoking Status Never  ?Smokeless Tobacco Never  ? ? ?Goals Met:  ?Independence with exercise equipment ?Exercise tolerated well ?No report of concerns or symptoms today ?Strength training completed today ? ?Goals Unmet:  ?Not Applicable ? ?Comments: First full day of exercise!  Patient was oriented to gym and equipment including functions, settings, policies, and procedures.  Patient's individual exercise prescription and treatment plan were reviewed.  All starting workloads were established based on the results of the 6 minute walk test done at initial orientation visit.  The plan for exercise progression was also introduced and progression will be customized based on patient's performance and goals. ? ? ? ?Dr. Emily Filbert is Medical Director for [place].  ?Dr. Ottie Glazier is Medical Director for [facility] Pulmonary Rehabilitation. ?

## 2021-08-03 NOTE — Progress Notes (Signed)
? Patient ID: Jermaine Kramer, male    DOB: Oct 10, 1950, 71 y.o.   MRN: [id number] ? ?HPI ? ?Jermaine Kramer is a 71 y/o male with a history of leukemia, DM, CAD (CABG 02/16/21), AF, sleep apnea, hyperlipidemia, HTN, stroke, GERD & chronic heart failure.  ? ?Echo report from 05/16/21 reviewed and showed and EF of <20% along with mild/moderate LAE and mild/moderate Jermaine.  ? ?Admitted 07/24/21 due to  worsening left flank pain today, which is constant, sharp, 8 out of 10 in severity, nonradiating. CT of lumbar spine is negative for acute injury, but showed degenerative disc disease.  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 Urology and GI consults obtained. Discharged after 3 days. Was in the ED 07/21/21 due to left sided flank pain. CT as positive for a kidney stone that is in the left kidney, nonobstructing. Jermaine Kramer was released with pain medication. AICD implanted 06/22/21. Admitted 05/14/21 due to lower extremity edema, increasing shortness of breath, cough and wheezing. Initially given IV lasix with transition to oral diuretics. Cardiology consult obtained. Discharged after 4 days.  ? ?Jermaine Kramer presents today for a follow-up visit with a chief complaint of moderate fatigue with minimal exertion. Jermaine Kramer describes this as chronic in nature although feels like it's slowly improving. Jermaine Kramer has associated shortness of breath along with this. Jermaine Kramer denies any difficulty sleeping, dizziness, abdominal distention, palpitations, pedal edema, chest pain, cough or weight gain.  ? ?Checking his BP, glucose and weight daily and brought logs in for review. Started cardiac rehab earlier this week and will be going twice a week. No longer having any abdominal pain.  ? ?No shocking from his AICD. ? ?Past Medical History:  ?Diagnosis Date  ? Arrhythmia   ? atrial fibrillation  ? CHF (congestive heart failure) ([place])   ? Coronary artery disease   ? Diabetes mellitus without complication ([place])   ? GERD (gastroesophageal  reflux disease)   ? Hypercholesteremia   ? Hypertension   ? Sleep apnea   ? Stroke [facility])   ? ?Past Surgical History:  ?Procedure Laterality Date  ? CHOLECYSTECTOMY    ? COLONOSCOPY WITH PROPOFOL N/A 04/14/2019  ? Procedure: COLONOSCOPY WITH PROPOFOL;  Surgeon: Toledo, Benay Pike, MD;  Location: [facility] ENDOSCOPY;  Service: Gastroenterology;  Laterality: N/A;  ? CORONARY ARTERY BYPASS GRAFT  02/16/2021  ? ICD IMPLANT N/A 06/22/2021  ? Procedure: ICD IMPLANT;  Surgeon: Vickie Epley, MD;  Location: [place] CV LAB;  Service: Cardiovascular;  Laterality: N/A;  ? KNEE ARTHROSCOPY    ? RENAL CYST EXCISION    ? ?Family History  ?Problem Relation Age of Onset  ? Parkinson's disease Mother   ? Lupus Mother   ? Heart disease Father   ? ?Social History  ? ?Tobacco Use  ? Smoking status: Never  ? Smokeless tobacco: Never  ?Substance Use Topics  ? Alcohol use: Not Currently  ? ?Allergies  ?Allergen Reactions  ? Doxycycline Anaphylaxis  ?  Patient does not recall having this reaction.  ?  ? Heparin Other (See Comments)  ?  heparin-induced thrombocytopenia ?  ? Empagliflozin Itching  ?  Groin infection ?Caused a groin infection.  ?  ? ?Prior to Admission medications   ?Medication Sig Start Date End Date Taking? Authorizing Provider  ?amiodarone (PACERONE) 200 MG tablet Take 200 mg by mouth daily. 03/06/21  Yes [provider]  ?apixaban (ELIQUIS) 5 MG TABS tablet Take 1 tablet (5 mg total) by mouth  2 (two) times daily. DO NOT RESTART UNTIL 06/27/2021. 06/22/21  Yes Dunn, Areta Haber, PA-C  ?aspirin EC 81 MG tablet Take 81 mg by mouth daily.   Yes [provider]  ?atorvastatin (LIPITOR) 80 MG tablet Take 80 mg by mouth daily. 02/10/21  Yes [provider]  ?furosemide (LASIX) 40 MG tablet Take 1 tablet (40 mg total) by mouth daily. ?Patient taking differently: Take 20 mg by mouth daily. 05/18/21  Yes Jennye Boroughs, MD  ?glucose blood (PRECISION QID TEST) test strip Use 3 (three) times daily Use as  instructed. One touch ultra 07/05/21 07/05/22 Yes [provider]  ?lisinopril (ZESTRIL) 2.5 MG tablet Take 1 tablet (2.5 mg total) by mouth daily. 06/02/21  Yes Alisa Graff, FNP  ?metFORMIN (GLUCOPHAGE) 1000 MG tablet Take 1,000 mg by mouth 2 (two) times daily with a meal.   Yes [provider]  ?metoprolol succinate (TOPROL-XL) 25 MG 24 hr tablet Take 12.5 mg by mouth daily.   Yes [provider]  ?omeprazole (PRILOSEC) 40 MG capsule Take 40 mg by mouth daily. 03/11/21  Yes [provider]  ?tamsulosin (FLOMAX) 0.4 MG CAPS capsule Take 0.8 mg by mouth daily.   Yes [provider]  ? ?Review of Systems  ?Constitutional:  Positive for fatigue (easily). Negative for appetite change.  ?HENT:  Negative for congestion, postnasal drip and sore throat.   ?Eyes: Negative.   ?Respiratory:  Positive for shortness of breath. Negative for cough and chest tightness.   ?Cardiovascular:  Negative for chest pain, palpitations and leg swelling.  ?Gastrointestinal:  Negative for abdominal distention and abdominal pain.  ?Endocrine: Negative.   ?Genitourinary: Negative.   ?Musculoskeletal:  Negative for back pain and neck pain.  ?Skin: Negative.   ?Allergic/Immunologic: Negative.   ?Neurological:  Negative for dizziness and light-headedness.  ?Hematological:  Negative for adenopathy. Does not bruise/bleed easily.  ?Psychiatric/Behavioral:  Negative for dysphoric mood and sleep disturbance. The patient is not nervous/anxious.   ? ?Vitals:  ? 08/04/21 0812  ?BP: 119/78  ?Pulse: 79  ?Resp: 20  ?SpO2: 98%  ?Weight: 192 lb 6 oz (87.3 kg)  ?Height: '5\' 8"'$  (1.727 m)  ? ?Wt Readings from Last 3 Encounters:  ?08/04/21 192 lb 6 oz (87.3 kg)  ?07/27/21 200 lb 9.9 oz (91 kg)  ?07/21/21 191 lb 5.8 oz (86.8 kg)  ? ?Lab Results  ?Component Value Date  ? CREATININE 1.23 07/27/2021  ? CREATININE 1.15 07/26/2021  ? CREATININE 1.30 (H) 07/25/2021  ? ?Physical Exam ?Vitals and nursing note reviewed. Exam  conducted with a chaperone present (wife).  ?Constitutional:   ?   Appearance: Normal appearance.  ?HENT:  ?   Head: Normocephalic and atraumatic.  ?Cardiovascular:  ?   Rate and Rhythm: Normal rate and regular rhythm.  ?Pulmonary:  ?   Effort: Pulmonary effort is normal. No respiratory distress.  ?   Breath sounds: No wheezing or rales.  ?Abdominal:  ?   General: There is no distension.  ?   Palpations: Abdomen is soft.  ?   Tenderness: There is no abdominal tenderness.  ?Musculoskeletal:  ?   Cervical back: Normal range of motion and neck supple.  ?   Right lower leg: No edema.  ?   Left lower leg: No edema.  ?Skin: ?   General: Skin is warm and dry.  ?Neurological:  ?   General: No focal deficit present.  ?   Mental Status: Jermaine Kramer is alert and oriented to  person, place, and time.  ?Psychiatric:     ?   Mood and Affect: Mood normal.     ?   Behavior: Behavior normal.     ?   Thought Content: Thought content normal.  ? ? ?Assessment & Plan: ? ?1: Chronic heart failure with reduced ejection fraction- ?- NYHA class III ?- euvolemic today ?- weighing daily and home weight chart reviewed & weight ranges from 137-140 pounds; reminded to call for an overnight weight gain of > 2 pounds or a weekly weight gain of >5 pounds ?- weight up 4 pounds from last visit here 1 month ago ?- not adding "much" salt and has been reading food labels for sodium content; understands to keep daily sodium intake to '2000mg'$  / day ?- on GDMT of metoprolol and lisinopril ?- allergic to empagliflozin (groin infection) ?- saw EP Quentin Ore) 06/15/21  ?- had AICD implanted 06/22/21; no shocks have been delivered ?- BNP 07/24/21 was 716.4 ? ?2: HTN- ?- BP looks good (119/78); has been low in the past ?- saw PCP [name]) 08/03/21 ?- BMP 07/27/21 reviewed and showed sodium 137, potassium 3.7, creatinine 1.23 & GFR >60 ? ?3: DM- ?- A1c 05/16/21 was 7.8% ?- glucose at home ranging from 130-140's ? ?4: Atrial fibrillation- ?- saw cardiology Corky Sox) 07/08/21 ?-  currently on amiodarone and apixaban ? ?5: CAD- ?- CABG with a LIMA to LAD, SVG to ramus, and SVG to PDA on 02/16/21 ?- participating in cardiac rehab ? ? ?Medication bottles reviewed.  ? ? ?Due to HF stability, will not m

## 2021-08-04 ENCOUNTER — Encounter: Payer: Self-pay | Admitting: Family

## 2021-08-04 ENCOUNTER — Ambulatory Visit: Payer: Medicare Other | Attending: Family | Admitting: Family

## 2021-08-04 VITALS — BP 119/78 | HR 79 | Resp 20 | Ht 68.0 in | Wt 192.4 lb

## 2021-08-04 DIAGNOSIS — I5022 Chronic systolic (congestive) heart failure: Secondary | ICD-10-CM | POA: Insufficient documentation

## 2021-08-04 DIAGNOSIS — Z79899 Other long term (current) drug therapy: Secondary | ICD-10-CM | POA: Insufficient documentation

## 2021-08-04 DIAGNOSIS — G4730 Sleep apnea, unspecified: Secondary | ICD-10-CM | POA: Insufficient documentation

## 2021-08-04 DIAGNOSIS — Z951 Presence of aortocoronary bypass graft: Secondary | ICD-10-CM | POA: Insufficient documentation

## 2021-08-04 DIAGNOSIS — Z8673 Personal history of transient ischemic attack (TIA), and cerebral infarction without residual deficits: Secondary | ICD-10-CM | POA: Diagnosis not present

## 2021-08-04 DIAGNOSIS — E119 Type 2 diabetes mellitus without complications: Secondary | ICD-10-CM | POA: Diagnosis not present

## 2021-08-04 DIAGNOSIS — I480 Paroxysmal atrial fibrillation: Secondary | ICD-10-CM

## 2021-08-04 DIAGNOSIS — I110 Hypertensive heart disease with heart failure: Secondary | ICD-10-CM | POA: Diagnosis present

## 2021-08-04 DIAGNOSIS — I255 Ischemic cardiomyopathy: Secondary | ICD-10-CM | POA: Diagnosis not present

## 2021-08-04 DIAGNOSIS — K219 Gastro-esophageal reflux disease without esophagitis: Secondary | ICD-10-CM | POA: Insufficient documentation

## 2021-08-04 DIAGNOSIS — I4891 Unspecified atrial fibrillation: Secondary | ICD-10-CM | POA: Insufficient documentation

## 2021-08-04 DIAGNOSIS — Z7901 Long term (current) use of anticoagulants: Secondary | ICD-10-CM | POA: Diagnosis not present

## 2021-08-04 DIAGNOSIS — Z856 Personal history of leukemia: Secondary | ICD-10-CM | POA: Insufficient documentation

## 2021-08-04 DIAGNOSIS — I2510 Atherosclerotic heart disease of native coronary artery without angina pectoris: Secondary | ICD-10-CM | POA: Diagnosis not present

## 2021-08-04 DIAGNOSIS — E785 Hyperlipidemia, unspecified: Secondary | ICD-10-CM | POA: Insufficient documentation

## 2021-08-04 DIAGNOSIS — Z95810 Presence of automatic (implantable) cardiac defibrillator: Secondary | ICD-10-CM | POA: Diagnosis not present

## 2021-08-04 DIAGNOSIS — I10 Essential (primary) hypertension: Secondary | ICD-10-CM

## 2021-08-04 NOTE — Patient Instructions (Addendum)
Continue weighing daily and call for an overnight weight gain of 3 pounds or more or a weekly weight gain of more than 5 pounds. ? ? ?If you have voicemail, please make sure your mailbox is cleaned out so that we may leave a message and please make sure to listen to any voicemails.  ? ? ?Call us in the future if you need us for anything ?

## 2021-08-08 ENCOUNTER — Encounter: Payer: Medicare Other | Admitting: *Deleted

## 2021-08-08 DIAGNOSIS — Z951 Presence of aortocoronary bypass graft: Secondary | ICD-10-CM

## 2021-08-08 LAB — GLUCOSE, CAPILLARY
Glucose-Capillary: 160 mg/dL — ABNORMAL HIGH (ref 70–99)
Glucose-Capillary: 161 mg/dL — ABNORMAL HIGH (ref 70–99)

## 2021-08-08 NOTE — Progress Notes (Signed)
Daily Session Note ? ?Patient Details  ?Name: Jermaine Kramer ?MRN: [id number] ?Date of Birth: 1951/04/21 ?Referring Provider:   ?Flowsheet Row Cardiac Rehab from 07/19/2021 in [facility] Cardiac and Pulmonary Rehab  ?Referring Provider Ernesta Amble MD  ? ?  ? ? ?Encounter Date: 08/08/2021 ? ?Check In: ? Session Check In - 08/08/21 1037   ? ?  ? Check-In  ? Supervising physician immediately available to respond to emergencies See telemetry face sheet for immediately available ER MD   ? Location [name]   ? Staff Present Renita Papa, RN Moises Blood, BS, ACSM CEP, Exercise Physiologist;Laureen Owens Shark, BS, RRT, CPFT   ? Virtual Visit No   ? Medication changes reported     No   ? Fall or balance concerns reported    No   ? Warm-up and Cool-down Performed on first and last piece of equipment   ? Resistance Training Performed Yes   ? VAD Patient? No   ? PAD/SET Patient? No   ?  ? Pain Assessment  ? Currently in Pain? No/denies   ? ?  ?  ? ?  ? ? ? ? ? ?Social History  ? ?Tobacco Use  ?Smoking Status Never  ?Smokeless Tobacco Never  ? ? ?Goals Met:  ?Independence with exercise equipment ?Exercise tolerated well ?No report of concerns or symptoms today ?Strength training completed today ? ?Goals Unmet:  ?Not Applicable ? ?Comments: Pt able to follow exercise prescription today without complaint.  Will continue to monitor for progression. ? ? ? ?Dr. Emily Filbert is Medical Director for [place].  ?Dr. Ottie Glazier is Medical Director for [facility] Pulmonary Rehabilitation. ?

## 2021-08-09 ENCOUNTER — Ambulatory Visit (INDEPENDENT_AMBULATORY_CARE_PROVIDER_SITE_OTHER): Payer: Medicare Other | Admitting: Gastroenterology

## 2021-08-09 ENCOUNTER — Other Ambulatory Visit: Payer: Self-pay

## 2021-08-09 ENCOUNTER — Encounter: Payer: Self-pay | Admitting: Gastroenterology

## 2021-08-09 VITALS — BP 100/66 | Temp 98.6°F | Ht 68.0 in | Wt 193.6 lb

## 2021-08-09 DIAGNOSIS — K654 Sclerosing mesenteritis: Secondary | ICD-10-CM

## 2021-08-09 DIAGNOSIS — I255 Ischemic cardiomyopathy: Secondary | ICD-10-CM

## 2021-08-09 DIAGNOSIS — R9389 Abnormal findings on diagnostic imaging of other specified body structures: Secondary | ICD-10-CM

## 2021-08-09 NOTE — Patient Instructions (Addendum)
Begin High Fiber Diet ?Repeat CAT Scan in 6 months  ?Follow up with Dr. Vicente Males in 7 months. ?

## 2021-08-09 NOTE — Progress Notes (Signed)
?  ?Jermaine Bellows MD, MRCP(U.K) ?[address]  ?[address]  ?[place], [place] [address]  ?Main: [phone]  ?Fax: [phone] ? ? ?Primary Care Physician: Sherre Scarlet, PA-C ? ?Primary Gastroenterologist:  Dr. Jonathon Kramer  ? ?Chief Complaint  ?Patient presents with  ? Hospitalization Follow-up  ? ? ?HPI: Jermaine Kramer is a 71 y.o. male was seen by myself on 07/25/2021 when he presented to the hospital with left flank pain.  He had a history of colon polyps during his colonoscopy in 2020 that were tubular adenomas.  He was on Eliquis for atrial fibrillation in addition to which she suffers from CHF, CABG, CAD, OSA and has an ICD in place.  The abdominal pain that brought him to the hospital began a few days prior to his initial visit he had been feeling constipated and did not go as often and has abdominal distention.  CT scan of the abdomen showed there was increased attenuation of mesenteric fat with mass effect on the surrounding loops of bowel with stool a few nodules within the mesenteric fat planes are present resulting with enteritis.  Hemoglobin 13.5 g LFTs normal lipase normal and BNP is elevated at 716 INR is 1.3.  The patient was discussed with Dr. Peyton Najjar and surgery who reviewed imaging with radiology and said that the changes were seen back in 2015 and has not changed.  There are chronic findings and hence would not require any immediate evaluation. ? ? ?We treated him empirically for constipation.  Since discharge has been doing well.  Having regular bowel movements with the MiraLAX.  No abdominal pains.  No other GI complaints. ? ?Current Outpatient Medications  ?Medication Sig Dispense Refill  ? amiodarone (PACERONE) 200 MG tablet Take 200 mg by mouth daily.    ? apixaban (ELIQUIS) 5 MG TABS tablet Take 1 tablet (5 mg total) by mouth 2 (two) times daily. DO NOT RESTART UNTIL 06/27/2021. 60 tablet   ? aspirin EC 81 MG tablet Take 81 mg by mouth daily.    ? atorvastatin (LIPITOR) 80 MG tablet  Take 80 mg by mouth daily.    ? furosemide (LASIX) 40 MG tablet Take 1 tablet (40 mg total) by mouth daily. (Patient taking differently: Take 20 mg by mouth daily.) 30 tablet 0  ? glucose blood (PRECISION QID TEST) test strip Use 3 (three) times daily Use as instructed. One touch ultra    ? lisinopril (ZESTRIL) 2.5 MG tablet Take 1 tablet (2.5 mg total) by mouth daily. 30 tablet 5  ? metFORMIN (GLUCOPHAGE) 1000 MG tablet Take 1,000 mg by mouth 2 (two) times daily with a meal.    ? metoprolol succinate (TOPROL-XL) 25 MG 24 hr tablet Take 12.5 mg by mouth daily.    ? omeprazole (PRILOSEC) 40 MG capsule Take 40 mg by mouth daily.    ? spironolactone (ALDACTONE) 25 MG tablet Take 12.5 mg by mouth daily.    ? tamsulosin (FLOMAX) 0.4 MG CAPS capsule Take 0.8 mg by mouth daily.    ? ?No current facility-administered medications for this visit.  ? ? ?Allergies as of 08/09/2021 - Review Complete 08/09/2021  ?Allergen Reaction Noted  ? Doxycycline Anaphylaxis 12/08/2020  ? Heparin Other (See Comments) 05/27/2021  ? Empagliflozin Itching 02/07/2021  ? ? ?ROS: ? ?General: Negative for anorexia, weight loss, fever, chills, fatigue, weakness. ?ENT: Negative for hoarseness, difficulty swallowing , nasal congestion. ?CV: Negative for chest pain, angina, palpitations, dyspnea on exertion, peripheral edema.  ?Respiratory:  Negative for dyspnea at rest, dyspnea on exertion, cough, sputum, wheezing.  ?GI: See history of present illness. ?GU:  Negative for dysuria, hematuria, urinary incontinence, urinary frequency, nocturnal urination.  ?Endo: Negative for unusual weight change.  ?  ?Physical Examination: ? ? BP 100/66   Temp 98.6 ?F (37 ?C) (Oral)   Ht '5\' 8"'$  (1.727 m)   Wt 193 lb 9.6 oz (87.8 kg)   BMI 29.44 kg/m?  ? ?General: Well-nourished, well-developed in no acute distress.  ?Eyes: No icterus. Conjunctivae pink. ?Mouth: Oropharyngeal mucosa moist and pink , no lesions erythema or exudate. ?Neuro: Alert and oriented x 3.   Grossly intact. ?Skin: Warm and dry, no jaundice.   ?Psych: Alert and cooperative, normal mood and affect. ? ? ?Imaging Studies: ?CT L-SPINE NO CHARGE ? ?Result Date: 07/24/2021 ?CLINICAL DATA:  Left flank pain. EXAM: CT LUMBAR SPINE WITHOUT CONTRAST TECHNIQUE: Multidetector CT imaging of the lumbar spine was performed without intravenous contrast administration. Multiplanar CT image reconstructions were also generated. RADIATION DOSE REDUCTION: This exam was performed according to the departmental dose-optimization program which includes automated exposure control, adjustment of the mA and/or kV according to patient size and/or use of iterative reconstruction technique. COMPARISON:  CT abdomen pelvis dated 07/21/2021. FINDINGS: Segmentation: 5 lumbar type vertebrae. Alignment: Normal. Vertebrae: No acute fracture or focal pathologic process. Paraspinal and other soft tissues: Atherosclerotic disease of the abdominal aorta is noted. Disc levels: Mild-to-moderate multilevel degenerative disc and joint disease is most significant at L5-S1. IMPRESSION: Mild-to-moderate multilevel degenerative disc and joint disease. Electronically Signed   By: Zerita Boers M.D.   On: 07/24/2021 14:24  ? ?DG Abd Portable 1V ? ?Result Date: 07/27/2021 ?CLINICAL DATA:  Left flank pain EXAM: PORTABLE ABDOMEN - 1 VIEW COMPARISON:  Chest x-ray dated July 26, 2021 FINDINGS: Nondilated air-filled loops of colon are seen. No gas-filled dilated loops of small bowel. Visualized lung bases demonstrate bibasilar atelectasis. No acute osseous abnormality. IMPRESSION: Nonobstructive bowel gas pattern. No gas-filled dilated loops of small bowel are seen on today's exam. Electronically Signed   By: Yetta Glassman M.D.   On: 07/27/2021 11:28  ? ?DG Abd Portable 1V ? ?Result Date: 07/26/2021 ?CLINICAL DATA:  Left flank pain EXAM: PORTABLE ABDOMEN - 1 VIEW COMPARISON:  10/19/2013 FINDINGS: Surgical clips are seen in the right upper quadrant and  epigastrium. Tip of pacemaker/defibrillator lead is seen in the right ventricle. There is mild dilation of small-bowel loops. Stomach is not distended. Gas and stool are present in colon. Small amount of stool is seen in the proximal colon. There is no fecal impaction in the rectosigmoid. Kidneys are partly obscured by bowel contents limiting evaluation for small renal stones. Degenerative changes are noted in the lumbar spine and both hips. IMPRESSION: There is mild dilation of small-bowel loops filled with gas suggesting ileus. Other findings as described in the body of the report. Electronically Signed   By: Elmer Picker M.D.   On: 07/26/2021 18:37  ? ?CT Renal Stone Study ? ?Result Date: 07/21/2021 ?CLINICAL DATA:  Left flank pain since this morning, history of nephrolithiasis EXAM: CT ABDOMEN AND PELVIS WITHOUT CONTRAST TECHNIQUE: Multidetector CT imaging of the abdomen and pelvis was performed following the standard protocol without IV contrast. RADIATION DOSE REDUCTION: This exam was performed according to the departmental dose-optimization program which includes automated exposure control, adjustment of the mA and/or kV according to patient size and/or use of iterative reconstruction technique. COMPARISON:  10/20/2013 FINDINGS: Lower chest: No acute pleural  or parenchymal lung disease. Mild cardiomegaly with dual lead pacer identified. Hepatobiliary: Cholecystectomy. Unremarkable unenhanced appearance of the liver. Pancreas: Unremarkable unenhanced appearance. Spleen: Unremarkable unenhanced appearance. Adrenals/Urinary Tract: Bilateral renal cortical atrophy again noted. There is a 3 mm nonobstructing calculus upper pole left kidney. No evidence of obstructive uropathy within either kidney. Simple appearing cortical cysts are noted within the upper poles of the kidneys bilaterally. The adrenals and bladder are stable. Stomach/Bowel: No bowel obstruction or ileus. Normal appendix right lower quadrant.  No bowel wall thickening or inflammatory change. Small hiatal hernia. Vascular/Lymphatic: Aortic atherosclerosis. No enlarged abdominal or pelvic lymph nodes. Reproductive: Enlarged prostate measuring 6.0 x 5.8 cm. Oth

## 2021-08-12 ENCOUNTER — Other Ambulatory Visit: Payer: Self-pay

## 2021-08-12 DIAGNOSIS — Z01812 Encounter for preprocedural laboratory examination: Secondary | ICD-10-CM

## 2021-08-12 NOTE — Progress Notes (Signed)
Pre procedure lab order entered for CT Scan. ? ?Thanks, ?Sharyn Lull, [place] ?

## 2021-08-15 ENCOUNTER — Encounter: Payer: Medicare Other | Admitting: *Deleted

## 2021-08-15 DIAGNOSIS — Z951 Presence of aortocoronary bypass graft: Secondary | ICD-10-CM

## 2021-08-15 NOTE — Progress Notes (Signed)
Daily Session Note ? ?Patient Details  ?Name: Jermaine Kramer ?MRN: [id number] ?Date of Birth: [birth year] ?Referring Provider:   ?Flowsheet Row Cardiac Rehab from 07/19/2021 in [facility] Cardiac and Pulmonary Rehab  ?Referring Provider Ernesta Amble MD  ? ?  ? ? ?Encounter Date: 08/15/2021 ? ?Check In: ? Session Check In - 08/15/21 1014   ? ?  ? Check-In  ? Supervising physician immediately available to respond to emergencies See telemetry face sheet for immediately available ER MD   ? Location [name]   ? Staff Present Renita Papa, RN Moises Blood, BS, ACSM CEP, Exercise Physiologist;Amanda Oletta Darter, [place], ACSM CEP, Exercise Physiologist   ? Virtual Visit No   ? Medication changes reported     No   ? Fall or balance concerns reported    No   ? Warm-up and Cool-down Performed on first and last piece of equipment   ? Resistance Training Performed Yes   ? VAD Patient? No   ? PAD/SET Patient? No   ?  ? Pain Assessment  ? Currently in Pain? No/denies   ? ?  ?  ? ?  ? ? ? ? ? ?Social History  ? ?Tobacco Use  ?Smoking Status Never  ?Smokeless Tobacco Never  ? ? ?Goals Met:  ?Independence with exercise equipment ?Exercise tolerated well ?Personal goals reviewed ?No report of concerns or symptoms today ?Strength training completed today ? ?Goals Unmet:  ?Not Applicable ? ?Comments: Pt able to follow exercise prescription today without complaint.  Will continue to monitor for progression. ? ? ? ?Dr. Emily Filbert is Medical Director for [place].  ?Dr. Ottie Glazier is Medical Director for [facility] Pulmonary Rehabilitation. ?

## 2021-08-16 ENCOUNTER — Other Ambulatory Visit: Payer: Self-pay | Admitting: Family

## 2021-08-16 ENCOUNTER — Telehealth: Payer: Self-pay | Admitting: Family

## 2021-08-16 ENCOUNTER — Ambulatory Visit
Admission: RE | Admit: 2021-08-16 | Discharge: 2021-08-16 | Disposition: A | Discharge status: Home / Self Care | Payer: Medicare Other | Source: Ambulatory Visit | Attending: Family | Admitting: Family

## 2021-08-16 DIAGNOSIS — I5023 Acute on chronic systolic (congestive) heart failure: Secondary | ICD-10-CM

## 2021-08-16 LAB — BASIC METABOLIC PANEL
Anion gap: 11 (ref 5–15)
BUN: 20 mg/dL (ref 8–23)
CO2: 21 mmol/L — ABNORMAL LOW (ref 22–32)
Calcium: 8.6 mg/dL — ABNORMAL LOW (ref 8.9–10.3)
Chloride: 105 mmol/L (ref 98–111)
Creatinine, Ser: 1.34 mg/dL — ABNORMAL HIGH (ref 0.61–1.24)
GFR, Estimated: 56 mL/min — ABNORMAL LOW (ref >60)
Glucose, Bld: 113 mg/dL — ABNORMAL HIGH (ref 70–99)
Potassium: 4 mmol/L (ref 3.5–5.1)
Sodium: 137 mmol/L (ref 135–145)

## 2021-08-16 LAB — BRAIN NATRIURETIC PEPTIDE: B Natriuretic Peptide: 1068.8 pg/mL — ABNORMAL HIGH (ref 0.0–100.0)

## 2021-08-16 MED ORDER — POTASSIUM CHLORIDE CRYS ER 20 MEQ PO TBCR
EXTENDED_RELEASE_TABLET | ORAL | Status: AC
  Filled 2021-08-16: qty 2

## 2021-08-16 MED ORDER — FUROSEMIDE 10 MG/ML IJ SOLN: 80.0000 mg | Freq: Once | INTRAMUSCULAR | Status: AC

## 2021-08-16 MED ORDER — FUROSEMIDE 10 MG/ML IJ SOLN
INTRAMUSCULAR | Status: AC
  Administered 2021-08-16: 80 mg via INTRAVENOUS
  Filled 2021-08-16: qty 8

## 2021-08-16 MED ORDER — POTASSIUM CHLORIDE CRYS ER 20 MEQ PO TBCR
40.0000 meq | EXTENDED_RELEASE_TABLET | Freq: Once | ORAL | Status: AC
  Administered 2021-08-16: 40 meq via ORAL

## 2021-08-16 NOTE — Telephone Encounter (Signed)
Patient's wife called and said patient is quite SOB today. Pulse ox is 93% but no weight gain or edema noted. She did give him an extra furosemide this morning per cardiology Corky Sox) recommendation. She was concerned though because she says that this is how he starts and then ends up in the hospital.  ? ?Discussed IV lasix and she would like for him to receive this. Orders placed for '80mg'$  IV lasix/ 16mq PO potassium for today. Will get BMP/BNP drawn today as well.  ? ?Explained that either I or Dr. OCorky Soxshould see patient this week or next to see how he is doing. Patient's wife said she would call Dr. OCorky Soxand if he couldn't get him in, she would call [place] to get him on our schedule.  ?

## 2021-08-17 ENCOUNTER — Encounter: Payer: Self-pay | Admitting: *Deleted

## 2021-08-17 DIAGNOSIS — Z951 Presence of aortocoronary bypass graft: Secondary | ICD-10-CM

## 2021-08-17 NOTE — Progress Notes (Signed)
Cardiac Individual Treatment Plan ? ?Patient Details  ?Name: Jermaine Kramer ?MRN: [id number] ?Date of Birth: 1950-06-21 ?Referring Provider:   ?Flowsheet Row Cardiac Rehab from 07/19/2021 in [facility] Cardiac and Pulmonary Rehab  ?Referring Provider Ernesta Amble MD  ? ?  ? ? ?Initial Encounter Date:  ?Flowsheet Row Cardiac Rehab from 07/19/2021 in [facility] Cardiac and Pulmonary Rehab  ?Date 07/19/21  ? ?  ? ? ?Visit Diagnosis: S/P CABG x 3 ? ?Patient's Home Medications on Admission: ? ?Current Outpatient Medications:  ?  amiodarone (PACERONE) 200 MG tablet, Take 200 mg by mouth daily., Disp: , Rfl:  ?  apixaban (ELIQUIS) 5 MG TABS tablet, Take 1 tablet (5 mg total) by mouth 2 (two) times daily. DO NOT RESTART UNTIL 06/27/2021., Disp: 60 tablet, Rfl:  ?  aspirin EC 81 MG tablet, Take 81 mg by mouth daily., Disp: , Rfl:  ?  atorvastatin (LIPITOR) 80 MG tablet, Take 80 mg by mouth daily., Disp: , Rfl:  ?  furosemide (LASIX) 40 MG tablet, Take 1 tablet (40 mg total) by mouth daily. (Patient taking differently: Take 20 mg by mouth daily.), Disp: 30 tablet, Rfl: 0 ?  glucose blood (PRECISION QID TEST) test strip, Use 3 (three) times daily Use as instructed. One touch ultra, Disp: , Rfl:  ?  lisinopril (ZESTRIL) 2.5 MG tablet, Take 1 tablet (2.5 mg total) by mouth daily., Disp: 30 tablet, Rfl: 5 ?  metFORMIN (GLUCOPHAGE) 1000 MG tablet, Take 1,000 mg by mouth 2 (two) times daily with a meal., Disp: , Rfl:  ?  metoprolol succinate (TOPROL-XL) 25 MG 24 hr tablet, Take 12.5 mg by mouth daily., Disp: , Rfl:  ?  omeprazole (PRILOSEC) 40 MG capsule, Take 40 mg by mouth daily., Disp: , Rfl:  ?  spironolactone (ALDACTONE) 25 MG tablet, Take 12.5 mg by mouth daily. (Patient not taking: Reported on 08/16/2021), Disp: , Rfl:  ?  tamsulosin (FLOMAX) 0.4 MG CAPS capsule, Take 0.8 mg by mouth daily., Disp: , Rfl:  ? ?Past Medical History: ?Past Medical History:  ?Diagnosis Date  ? Arrhythmia   ? atrial fibrillation  ? CHF (congestive heart failure)  ([place])   ? Coronary artery disease   ? Diabetes mellitus without complication ([place])   ? GERD (gastroesophageal reflux disease)   ? Hypercholesteremia   ? Hypertension   ? Sleep apnea   ? Stroke [facility])   ? ? ?Tobacco Use: ?Social History  ? ?Tobacco Use  ?Smoking Status Never  ?Smokeless Tobacco Never  ? ? ?Labs: ?Review Flowsheet   ? ?  ?  Latest Ref Rng & Units 04/21/2021 05/16/2021  ?Labs for [facility] Cardiac and Pulmonary Rehab  ?Hemoglobin A1c 4.8 - 5.6 %  7.8    ?Bicarbonate 20.0 - 28.0 mmol/L 23.2     ?Acid-base deficit 0.0 - 2.0 mmol/L 0.6     ?O2 Saturation % 97.4     ?  ? ? Multiple values from one day are sorted in reverse-chronological order  ?  ?  ? ? ? ?Exercise Target Goals: ?Exercise Program Goal: ?Individual exercise prescription set using results from initial 6 min walk test and THRR while considering  patient?s activity barriers and safety.  ? ?Exercise Prescription Goal: ?Initial exercise prescription builds to 30-45 minutes a day of aerobic activity, 2-3 days per week.  Home exercise guidelines will be given to patient during program as part of exercise prescription that the participant will acknowledge. ? ? ?Education: Aerobic Exercise: ?- Group verbal and  visual presentation on the components of exercise prescription. Introduces F.I.T.T principle from ACSM for exercise prescriptions.  Reviews F.I.T.T. principles of aerobic exercise including progression. Written material given at graduation. ?Flowsheet Row Cardiac Rehab from 07/19/2021 in [facility] Cardiac and Pulmonary Rehab  ?Education need identified 07/19/21  ? ?  ? ? ?Education: Resistance Exercise: ?- Group verbal and visual presentation on the components of exercise prescription. Introduces F.I.T.T principle from ACSM for exercise prescriptions  Reviews F.I.T.T. principles of resistance exercise including progression. Written material given at graduation. ? ?  ?Education: Exercise & Equipment Safety: ?- Individual verbal instruction and demonstration  of equipment use and safety with use of the equipment. ?Flowsheet Row Cardiac Rehab from 07/11/2021 in [facility] Cardiac and Pulmonary Rehab  ?Date 07/11/21  ?Educator [facility]  ?Instruction Review Code 1- Verbalizes Understanding  ? ?  ? ? ?Education: Exercise Physiology & General Exercise Guidelines: ?- Group verbal and written instruction with models to review the exercise physiology of the cardiovascular system and associated critical values. Provides general exercise guidelines with specific guidelines to those with heart or lung disease.  ?Flowsheet Row Cardiac Rehab from 07/19/2021 in [facility] Cardiac and Pulmonary Rehab  ?Education need identified 07/19/21  ? ?  ? ? ?Education: Flexibility, Balance, Mind/Body Relaxation: ?- Group verbal and visual presentation with interactive activity on the components of exercise prescription. Introduces F.I.T.T principle from ACSM for exercise prescriptions. Reviews F.I.T.T. principles of flexibility and balance exercise training including progression. Also discusses the mind body connection.  Reviews various relaxation techniques to help reduce and manage stress (i.e. Deep breathing, progressive muscle relaxation, and visualization). Balance handout provided to take home. Written material given at graduation. ? ? ?Activity Barriers & Risk Stratification: ? Activity Barriers & Cardiac Risk Stratification - 07/19/21 1442   ? ?  ? Activity Barriers & Cardiac Risk Stratification  ? Activity Barriers Decreased Ventricular Function;Other (comment);Deconditioning   ? Comments Per patient, weight restrictions until 4/6- recent ICD placement   ? Cardiac Risk Stratification High   ? ?  ?  ? ?  ? ? ?6 Minute Walk: ? 6 Minute Walk   ? ? [place] Name 07/19/21 1458  ?  ?  ?  ? 6 Minute Walk  ? Phase Initial    ? Distance 1225 feet    ? Walk Time 6 minutes    ? # of Rest Breaks 0    ? MPH 2.32    ? METS 2.76    ? RPE 11    ? Perceived Dyspnea  1    ? VO2 Peak 9.66    ? Symptoms Yes (comment)    ? Comments  SOB, legs fatigued    ? Resting HR 75 bpm    ? Resting BP 104/64    ? Resting Oxygen Saturation  97 %    ? Exercise Oxygen Saturation  during 6 min walk 95 %    ? Max Ex. HR 111 bpm    ? Max Ex. BP 140/68    ? 2 Minute Post BP 108/68    ? ?  ?  ? ?  ? ? ?Oxygen Initial Assessment: ? ? ?Oxygen Re-Evaluation: ? ? ?Oxygen Discharge (Final Oxygen Re-Evaluation): ? ? ?Initial Exercise Prescription: ? Initial Exercise Prescription - 07/19/21 1500   ? ?  ? Date of Initial Exercise RX and Referring Provider  ? Date 07/19/21   ? Referring Provider Ernesta Amble MD   ?  ? Oxygen  ? Maintain Oxygen Saturation  88% or higher   ?  ? Treadmill  ? MPH 2.1   ? Grade 0.5   ? Minutes 15   ? METs 2.75   ?  ? Recumbant Bike  ? Level 2   ? RPM 60   ? Watts 20   ? Minutes 15   ? METs 2.7   ?  ? REL-XR  ? Level 1   ? Speed 50   ? Minutes 15   ? METs 2.7   ?  ? Prescription Details  ? Frequency (times per week) 2   ? Duration Progress to 30 minutes of continuous aerobic without signs/symptoms of physical distress   ?  ? Intensity  ? THRR 40-80% of Max Heartrate 104-133   ? Ratings of Perceived Exertion 11-13   ? Perceived Dyspnea 0-4   ?  ? Progression  ? Progression Continue to progress workloads to maintain intensity without signs/symptoms of physical distress.   ?  ? Resistance Training  ? Training Prescription Yes   ? Weight 3 lb   ROM until 4//6: Recent ICD placement  ? Reps 10-15   ? ?  ?  ? ?  ? ? ?Perform Capillary Blood Glucose checks as needed. ? ?Exercise Prescription Changes: ? ? Exercise Prescription Changes   ? ? [place] Name 07/19/21 1500 08/09/21 1600  ?  ?  ?  ?  ? Response to Exercise  ? Blood Pressure (Admit) 104/64 118/60     ? Blood Pressure (Exercise) 140/68 150/72     ? Blood Pressure (Exit) 108/68 102/54     ? Heart Rate (Admit) 75 bpm 93 bpm     ? Heart Rate (Exercise) 111 bpm 118 bpm     ? Heart Rate (Exit) 79 bpm 101 bpm     ? Oxygen Saturation (Admit) 97 % --     ? Oxygen Saturation (Exercise) 95 % --     ? Rating  of Perceived Exertion (Exercise) 11 15     ? Perceived Dyspnea (Exercise) 1 --     ? Symptoms SOB, legs fatigued none     ? Comments walk test results 2nd full day of exercise     ? Duration -- Progress

## 2021-08-17 NOTE — Progress Notes (Signed)
Daily Session Note ? ?Patient Details  ?Name: Jermaine Kramer ?MRN: [id number] ?Date of Birth: 1950-08-05 ?Referring Provider:   ?Flowsheet Row Cardiac Rehab from 07/19/2021 in [facility] Cardiac and Pulmonary Rehab  ?Referring Provider Ernesta Amble MD  ? ?  ? ? ?Encounter Date: 08/17/2021 ? ?Check In: ? Session Check In - 08/17/21 0955   ? ?  ? Check-In  ? Supervising physician immediately available to respond to emergencies See telemetry face sheet for immediately available ER MD   ? Location [name]   ? Staff Present Birdie Sons, MPA, RN;Joseph Gresham, RCP,RRT,BSRT;Amanda Sommer, BA, ACSM CEP, Exercise Physiologist   ? Virtual Visit No   ? Medication changes reported     No   ? Fall or balance concerns reported    No   ? Warm-up and Cool-down Performed on first and last piece of equipment   ? Resistance Training Performed Yes   ? VAD Patient? No   ? PAD/SET Patient? No   ?  ? Pain Assessment  ? Currently in Pain? No/denies   ? ?  ?  ? ?  ? ? ? ? ? ?Social History  ? ?Tobacco Use  ?Smoking Status Never  ?Smokeless Tobacco Never  ? ? ?Goals Met:  ?Independence with exercise equipment ?Exercise tolerated well ?No report of concerns or symptoms today ?Strength training completed today ? ?Goals Unmet:  ?Not Applicable ? ?Comments: Pt able to follow exercise prescription today without complaint.  Will continue to monitor for progression. ? ? ? ?Dr. Emily Filbert is Medical Director for [place].  ?Dr. Ottie Glazier is Medical Director for [facility] Pulmonary Rehabilitation. ?

## 2021-08-22 ENCOUNTER — Encounter: Payer: Medicare Other | Admitting: *Deleted

## 2021-08-22 DIAGNOSIS — Z951 Presence of aortocoronary bypass graft: Secondary | ICD-10-CM

## 2021-08-22 NOTE — Progress Notes (Signed)
Daily Session Note ? ?Patient Details  ?Name: Jermaine Kramer ?MRN: [id number] ?Date of Birth: January 21, 1951 ?Referring Provider:   ?Flowsheet Row Cardiac Rehab from 07/19/2021 in [facility] Cardiac and Pulmonary Rehab  ?Referring Provider Ernesta Amble MD  ? ?  ? ? ?Encounter Date: 08/22/2021 ? ?Check In: ? Session Check In - 08/22/21 1021   ? ?  ? Check-In  ? Supervising physician immediately available to respond to emergencies See telemetry face sheet for immediately available ER MD   ? Location [name]   ? Staff Present Renita Papa, RN Moises Blood, BS, ACSM CEP, Exercise Physiologist;Kara Eliezer Bottom, MS, ASCM CEP, Exercise Physiologist   ? Virtual Visit No   ? Medication changes reported     No   ? Fall or balance concerns reported    No   ? Warm-up and Cool-down Performed on first and last piece of equipment   ? Resistance Training Performed Yes   ? VAD Patient? No   ? PAD/SET Patient? No   ?  ? Pain Assessment  ? Currently in Pain? No/denies   ? ?  ?  ? ?  ? ? ? ? ? ?Social History  ? ?Tobacco Use  ?Smoking Status Never  ?Smokeless Tobacco Never  ? ? ?Goals Met:  ?Independence with exercise equipment ?Exercise tolerated well ?No report of concerns or symptoms today ?Strength training completed today ? ?Goals Unmet:  ?Not Applicable ? ?Comments: Pt able to follow exercise prescription today without complaint.  Will continue to monitor for progression. ? ? ? ?Dr. Emily Filbert is Medical Director for [place].  ?Dr. Ottie Glazier is Medical Director for [facility] Pulmonary Rehabilitation. ?

## 2021-08-24 DIAGNOSIS — Z951 Presence of aortocoronary bypass graft: Secondary | ICD-10-CM | POA: Diagnosis not present

## 2021-08-24 NOTE — Progress Notes (Signed)
Daily Session Note ? ?Patient Details  ?Name: Jermaine Kramer ?MRN: [id number] ?Date of Birth: [birth year] ?Referring Provider:   ?Flowsheet Row Cardiac Rehab from 07/19/2021 in [facility] Cardiac and Pulmonary Rehab  ?Referring Provider Ernesta Amble MD  ? ?  ? ? ?Encounter Date: 08/24/2021 ? ?Check In: ? Session Check In - 08/24/21 0953   ? ?  ? Check-In  ? Supervising physician immediately available to respond to emergencies See telemetry face sheet for immediately available ER MD   ? Location [name]   ? Staff Present Birdie Sons, MPA, RN;Melissa Bentley, RDN, Tawanna Solo, MS, ASCM CEP, Exercise Physiologist;Joseph [place], [place]   ? Virtual Visit No   ? Medication changes reported     No   ? Fall or balance concerns reported    No   ? Warm-up and Cool-down Performed on first and last piece of equipment   ? Resistance Training Performed Yes   ? VAD Patient? No   ? PAD/SET Patient? No   ?  ? Pain Assessment  ? Currently in Pain? No/denies   ? ?  ?  ? ?  ? ? ? ? ? ?Social History  ? ?Tobacco Use  ?Smoking Status Never  ?Smokeless Tobacco Never  ? ? ?Goals Met:  ?Independence with exercise equipment ?Exercise tolerated well ?No report of concerns or symptoms today ?Strength training completed today ? ?Goals Unmet:  ?Not Applicable ? ?Comments: Pt able to follow exercise prescription today without complaint.  Will continue to monitor for progression. ? ? ? ?Dr. Emily Filbert is Medical Director for [place].  ?Dr. Ottie Glazier is Medical Director for [facility] Pulmonary Rehabilitation. ?

## 2021-08-25 ENCOUNTER — Other Ambulatory Visit
Admission: RE | Admit: 2021-08-25 | Discharge: 2021-08-25 | Disposition: A | Discharge status: Home / Self Care | Payer: Medicare Other | Source: Ambulatory Visit | Attending: Cardiology | Admitting: Cardiology

## 2021-08-25 DIAGNOSIS — I25118 Atherosclerotic heart disease of native coronary artery with other forms of angina pectoris: Secondary | ICD-10-CM | POA: Insufficient documentation

## 2021-08-25 DIAGNOSIS — I420 Dilated cardiomyopathy: Secondary | ICD-10-CM | POA: Diagnosis present

## 2021-08-25 LAB — BRAIN NATRIURETIC PEPTIDE: B Natriuretic Peptide: 686.1 pg/mL — ABNORMAL HIGH (ref 0.0–100.0)

## 2021-08-29 ENCOUNTER — Encounter: Payer: Medicare Other | Attending: Internal Medicine | Admitting: *Deleted

## 2021-08-29 DIAGNOSIS — Z951 Presence of aortocoronary bypass graft: Secondary | ICD-10-CM | POA: Diagnosis present

## 2021-08-29 NOTE — Progress Notes (Signed)
Daily Session Note ? ?Patient Details  ?Name: Jermaine Kramer ?MRN: [id number] ?Date of Birth: 1950-07-13 ?Referring Provider:   ?Flowsheet Row Cardiac Rehab from 07/19/2021 in [facility] Cardiac and Pulmonary Rehab  ?Referring Provider Ernesta Amble MD  ? ?  ? ? ?Encounter Date: 08/29/2021 ? ?Check In: ? Session Check In - 08/29/21 1032   ? ?  ? Check-In  ? Supervising physician immediately available to respond to emergencies See telemetry face sheet for immediately available ER MD   ? Location [name]   ? Staff Present Heath Lark, RN, BSN, [employer], MPA, RN;Kelly Amedeo Plenty, BS, ACSM CEP, Exercise Physiologist   ? Virtual Visit No   ? Medication changes reported     No   ? Fall or balance concerns reported    No   ? Warm-up and Cool-down Performed on first and last piece of equipment   ? Resistance Training Performed Yes   ? VAD Patient? No   ? PAD/SET Patient? No   ?  ? Pain Assessment  ? Currently in Pain? No/denies   ? ?  ?  ? ?  ? ? ? ? Exercise Prescription Changes - 08/29/21 1000   ? ?  ? Home Exercise Plan  ? Plans to continue exercise at Home (comment)   walking, staff vidoes  ? Frequency Add 2 additional days to program exercise sessions.   ? Initial Home Exercises Provided 08/29/21   ? ?  ?  ? ?  ? ? ?Social History  ? ?Tobacco Use  ?Smoking Status Never  ?Smokeless Tobacco Never  ? ? ?Goals Met:  ?Independence with exercise equipment ?Exercise tolerated well ?No report of concerns or symptoms today ? ?Goals Unmet:  ?Not Applicable ? ?Comments: Pt able to follow exercise prescription today without complaint.  Will continue to monitor for progression. ? ? ? ?Dr. Emily Filbert is Medical Director for [place].  ?Dr. Ottie Glazier is Medical Director for [facility] Pulmonary Rehabilitation. ?

## 2021-08-31 DIAGNOSIS — Z951 Presence of aortocoronary bypass graft: Secondary | ICD-10-CM | POA: Diagnosis not present

## 2021-08-31 NOTE — Progress Notes (Signed)
Daily Session Note ? ?Patient Details  ?Name: Jermaine Kramer ?MRN: [id number] ?Date of Birth: 1951/01/23 ?Referring Provider:   ?Flowsheet Row Cardiac Rehab from 07/19/2021 in [facility] Cardiac and Pulmonary Rehab  ?Referring Provider Ernesta Amble MD  ? ?  ? ? ?Encounter Date: 08/31/2021 ? ?Check In: ? Session Check In - 08/31/21 0952   ? ?  ? Check-In  ? Supervising physician immediately available to respond to emergencies See telemetry face sheet for immediately available ER MD   ? Location [name]   ? Staff Present Birdie Sons, MPA, RN;Joseph [place], RCP,RRT,BSRT;Melissa [place], RDN, LDN   ? Virtual Visit No   ? Medication changes reported     No   ? Fall or balance concerns reported    No   ? Warm-up and Cool-down Performed on first and last piece of equipment   ? Resistance Training Performed Yes   ? VAD Patient? No   ? PAD/SET Patient? No   ?  ? Pain Assessment  ? Currently in Pain? No/denies   ? ?  ?  ? ?  ? ? ? ? ? ?Social History  ? ?Tobacco Use  ?Smoking Status Never  ?Smokeless Tobacco Never  ? ? ?Goals Met:  ?Independence with exercise equipment ?Exercise tolerated well ?No report of concerns or symptoms today ?Strength training completed today ? ?Goals Unmet:  ?Not Applicable ? ?Comments: Pt able to follow exercise prescription today without complaint.  Will continue to monitor for progression. ? ? ? ?Dr. Emily Filbert is Medical Director for [place].  ?Dr. Ottie Glazier is Medical Director for [facility] Pulmonary Rehabilitation. ?

## 2021-09-07 DIAGNOSIS — Z951 Presence of aortocoronary bypass graft: Secondary | ICD-10-CM | POA: Diagnosis not present

## 2021-09-07 NOTE — Progress Notes (Signed)
Daily Session Note ? ?Patient Details  ?Name: Jermaine Kramer ?MRN: [id number] ?Date of Birth: [birth year] ?Referring Provider:   ?Flowsheet Row Cardiac Rehab from 07/19/2021 in [facility] Cardiac and Pulmonary Rehab  ?Referring Provider Ernesta Amble MD  ? ?  ? ? ?Encounter Date: 09/07/2021 ? ?Check In: ? Session Check In - 09/07/21 0956   ? ?  ? Check-In  ? Supervising physician immediately available to respond to emergencies See telemetry face sheet for immediately available ER MD   ? Location [name]   ? Staff Present Birdie Sons, MPA, RN;Melissa [place], RDN, LDN;Joseph [place], RCP,RRT,BSRT   ? Virtual Visit No   ? Medication changes reported     No   ? Fall or balance concerns reported    No   ? Warm-up and Cool-down Performed on first and last piece of equipment   ? Resistance Training Performed Yes   ? VAD Patient? No   ? PAD/SET Patient? No   ?  ? Pain Assessment  ? Currently in Pain? No/denies   ? ?  ?  ? ?  ? ? ? ? ? ?Social History  ? ?Tobacco Use  ?Smoking Status Never  ?Smokeless Tobacco Never  ? ? ?Goals Met:  ?Independence with exercise equipment ?Exercise tolerated well ?No report of concerns or symptoms today ?Strength training completed today ? ?Goals Unmet:  ?Not Applicable ? ?Comments: Pt able to follow exercise prescription today without complaint.  Will continue to monitor for progression. ? ? ? ?Dr. Emily Filbert is Medical Director for [place].  ?Dr. Ottie Glazier is Medical Director for [facility] Pulmonary Rehabilitation. ?

## 2021-09-12 ENCOUNTER — Encounter: Payer: Medicare Other | Admitting: *Deleted

## 2021-09-12 DIAGNOSIS — I130 Hypertensive heart and chronic kidney disease with heart failure and stage 1 through stage 4 chronic kidney disease, or unspecified chronic kidney disease: Secondary | ICD-10-CM | POA: Diagnosis not present

## 2021-09-12 DIAGNOSIS — Z951 Presence of aortocoronary bypass graft: Secondary | ICD-10-CM

## 2021-09-12 DIAGNOSIS — J810 Acute pulmonary edema: Secondary | ICD-10-CM | POA: Diagnosis not present

## 2021-09-12 NOTE — Progress Notes (Signed)
Daily Session Note ? ?Patient Details  ?Name: Jermaine Kramer ?MRN: [id number] ?Date of Birth: 21-May-1950 ?Referring Provider:   ?Flowsheet Row Cardiac Rehab from 07/19/2021 in [facility] Cardiac and Pulmonary Rehab  ?Referring Provider Ernesta Amble MD  ? ?  ? ? ?Encounter Date: 09/12/2021 ? ?Check In: ? Session Check In - 09/12/21 1022   ? ?  ? Check-In  ? Supervising physician immediately available to respond to emergencies See telemetry face sheet for immediately available ER MD   ? Location [name]   ? Staff Present Heath Lark, RN, BSN, [employer], MPA, RN;Kelly Amedeo Plenty, BS, ACSM CEP, Exercise Physiologist   ? Virtual Visit No   ? Medication changes reported     No   ? Fall or balance concerns reported    No   ? Warm-up and Cool-down Performed on first and last piece of equipment   ? Resistance Training Performed Yes   ? VAD Patient? No   ? PAD/SET Patient? No   ?  ? Pain Assessment  ? Currently in Pain? No/denies   ? ?  ?  ? ?  ? ? ? ? ? ?Social History  ? ?Tobacco Use  ?Smoking Status Never  ?Smokeless Tobacco Never  ? ? ?Goals Met:  ?Independence with exercise equipment ?Exercise tolerated well ?No report of concerns or symptoms today ? ?Goals Unmet:  ?Not Applicable ? ?Comments: Pt able to follow exercise prescription today without complaint.  Will continue to monitor for progression. ? ? ? ?Dr. Emily Filbert is Medical Director for [place].  ?Dr. Ottie Glazier is Medical Director for [facility] Pulmonary Rehabilitation. ?

## 2021-09-14 ENCOUNTER — Encounter: Payer: Self-pay | Admitting: *Deleted

## 2021-09-14 DIAGNOSIS — Z951 Presence of aortocoronary bypass graft: Secondary | ICD-10-CM

## 2021-09-14 NOTE — Progress Notes (Signed)
Cardiac Individual Treatment Plan ? ?Patient Details  ?Name: Jermaine Kramer ?MRN: [id number] ?Date of Birth: 07/12/1950 ?Referring Provider:   ?Flowsheet Row Cardiac Rehab from 07/19/2021 in [facility] Cardiac and Pulmonary Rehab  ?Referring Provider Ernesta Amble MD  ? ?  ? ? ?Initial Encounter Date:  ?Flowsheet Row Cardiac Rehab from 07/19/2021 in [facility] Cardiac and Pulmonary Rehab  ?Date 07/19/21  ? ?  ? ? ?Visit Diagnosis: S/P CABG x 3 ? ?Patient's Home Medications on Admission: ? ?Current Outpatient Medications:  ?  amiodarone (PACERONE) 200 MG tablet, Take 200 mg by mouth daily., Disp: , Rfl:  ?  apixaban (ELIQUIS) 5 MG TABS tablet, Take 1 tablet (5 mg total) by mouth 2 (two) times daily. DO NOT RESTART UNTIL 06/27/2021., Disp: 60 tablet, Rfl:  ?  aspirin EC 81 MG tablet, Take 81 mg by mouth daily., Disp: , Rfl:  ?  atorvastatin (LIPITOR) 80 MG tablet, Take 80 mg by mouth daily., Disp: , Rfl:  ?  furosemide (LASIX) 40 MG tablet, Take 1 tablet (40 mg total) by mouth daily. (Patient taking differently: Take 20 mg by mouth daily.), Disp: 30 tablet, Rfl: 0 ?  glucose blood (PRECISION QID TEST) test strip, Use 3 (three) times daily Use as instructed. One touch ultra, Disp: , Rfl:  ?  lisinopril (ZESTRIL) 2.5 MG tablet, Take 1 tablet (2.5 mg total) by mouth daily., Disp: 30 tablet, Rfl: 5 ?  metFORMIN (GLUCOPHAGE) 1000 MG tablet, Take 1,000 mg by mouth 2 (two) times daily with a meal., Disp: , Rfl:  ?  metoprolol succinate (TOPROL-XL) 25 MG 24 hr tablet, Take 12.5 mg by mouth daily., Disp: , Rfl:  ?  omeprazole (PRILOSEC) 40 MG capsule, Take 40 mg by mouth daily., Disp: , Rfl:  ?  spironolactone (ALDACTONE) 25 MG tablet, Take 12.5 mg by mouth daily. (Patient not taking: Reported on 08/16/2021), Disp: , Rfl:  ?  tamsulosin (FLOMAX) 0.4 MG CAPS capsule, Take 0.8 mg by mouth daily., Disp: , Rfl:  ? ?Past Medical History: ?Past Medical History:  ?Diagnosis Date  ? Arrhythmia   ? atrial fibrillation  ? CHF (congestive heart failure)  ([place])   ? Coronary artery disease   ? Diabetes mellitus without complication ([place])   ? GERD (gastroesophageal reflux disease)   ? Hypercholesteremia   ? Hypertension   ? Sleep apnea   ? Stroke [facility])   ? ? ?Tobacco Use: ?Social History  ? ?Tobacco Use  ?Smoking Status Never  ?Smokeless Tobacco Never  ? ? ?Labs: ?Review Flowsheet   ? ?  ?  Latest Ref Rng & Units 04/21/2021 05/16/2021  ?Labs for [facility] Cardiac and Pulmonary Rehab  ?Hemoglobin A1c 4.8 - 5.6 %  7.8    ?Bicarbonate 20.0 - 28.0 mmol/L 23.2     ?Acid-base deficit 0.0 - 2.0 mmol/L 0.6     ?O2 Saturation % 97.4     ?  ?  ?  ? ? ? ?Exercise Target Goals: ?Exercise Program Goal: ?Individual exercise prescription set using results from initial 6 min walk test and THRR while considering  patient?s activity barriers and safety.  ? ?Exercise Prescription Goal: ?Initial exercise prescription builds to 30-45 minutes a day of aerobic activity, 2-3 days per week.  Home exercise guidelines will be given to patient during program as part of exercise prescription that the participant will acknowledge. ? ? ?Education: Aerobic Exercise: ?- Group verbal and visual presentation on the components of exercise prescription. Introduces F.I.T.T principle from ACSM  for exercise prescriptions.  Reviews F.I.T.T. principles of aerobic exercise including progression. Written material given at graduation. ?Flowsheet Row Cardiac Rehab from 07/19/2021 in [facility] Cardiac and Pulmonary Rehab  ?Education need identified 07/19/21  ? ?  ? ? ?Education: Resistance Exercise: ?- Group verbal and visual presentation on the components of exercise prescription. Introduces F.I.T.T principle from ACSM for exercise prescriptions  Reviews F.I.T.T. principles of resistance exercise including progression. Written material given at graduation. ? ?  ?Education: Exercise & Equipment Safety: ?- Individual verbal instruction and demonstration of equipment use and safety with use of the equipment. ?Flowsheet Row  Cardiac Rehab from 07/11/2021 in [facility] Cardiac and Pulmonary Rehab  ?Date 07/11/21  ?Educator [facility]  ?Instruction Review Code 1- Verbalizes Understanding  ? ?  ? ? ?Education: Exercise Physiology & General Exercise Guidelines: ?- Group verbal and written instruction with models to review the exercise physiology of the cardiovascular system and associated critical values. Provides general exercise guidelines with specific guidelines to those with heart or lung disease.  ?Flowsheet Row Cardiac Rehab from 07/19/2021 in [facility] Cardiac and Pulmonary Rehab  ?Education need identified 07/19/21  ? ?  ? ? ?Education: Flexibility, Balance, Mind/Body Relaxation: ?- Group verbal and visual presentation with interactive activity on the components of exercise prescription. Introduces F.I.T.T principle from ACSM for exercise prescriptions. Reviews F.I.T.T. principles of flexibility and balance exercise training including progression. Also discusses the mind body connection.  Reviews various relaxation techniques to help reduce and manage stress (i.e. Deep breathing, progressive muscle relaxation, and visualization). Balance handout provided to take home. Written material given at graduation. ? ? ?Activity Barriers & Risk Stratification: ? Activity Barriers & Cardiac Risk Stratification - 07/19/21 1442   ? ?  ? Activity Barriers & Cardiac Risk Stratification  ? Activity Barriers Decreased Ventricular Function;Other (comment);Deconditioning   ? Comments Per patient, weight restrictions until 4/6- recent ICD placement   ? Cardiac Risk Stratification High   ? ?  ?  ? ?  ? ? ?6 Minute Walk: ? 6 Minute Walk   ? ? [place] Name 07/19/21 1458  ?  ?  ?  ? 6 Minute Walk  ? Phase Initial    ? Distance 1225 feet    ? Walk Time 6 minutes    ? # of Rest Breaks 0    ? MPH 2.32    ? METS 2.76    ? RPE 11    ? Perceived Dyspnea  1    ? VO2 Peak 9.66    ? Symptoms Yes (comment)    ? Comments SOB, legs fatigued    ? Resting HR 75 bpm    ? Resting BP 104/64    ?  Resting Oxygen Saturation  97 %    ? Exercise Oxygen Saturation  during 6 min walk 95 %    ? Max Ex. HR 111 bpm    ? Max Ex. BP 140/68    ? 2 Minute Post BP 108/68    ? ?  ?  ? ?  ? ? ?Oxygen Initial Assessment: ? ? ?Oxygen Re-Evaluation: ? ? ?Oxygen Discharge (Final Oxygen Re-Evaluation): ? ? ?Initial Exercise Prescription: ? Initial Exercise Prescription - 07/19/21 1500   ? ?  ? Date of Initial Exercise RX and Referring Provider  ? Date 07/19/21   ? Referring Provider Ernesta Amble MD   ?  ? Oxygen  ? Maintain Oxygen Saturation 88% or higher   ?  ? Treadmill  ? MPH 2.1   ?  Grade 0.5   ? Minutes 15   ? METs 2.75   ?  ? Recumbant Bike  ? Level 2   ? RPM 60   ? Watts 20   ? Minutes 15   ? METs 2.7   ?  ? REL-XR  ? Level 1   ? Speed 50   ? Minutes 15   ? METs 2.7   ?  ? Prescription Details  ? Frequency (times per week) 2   ? Duration Progress to 30 minutes of continuous aerobic without signs/symptoms of physical distress   ?  ? Intensity  ? THRR 40-80% of Max Heartrate 104-133   ? Ratings of Perceived Exertion 11-13   ? Perceived Dyspnea 0-4   ?  ? Progression  ? Progression Continue to progress workloads to maintain intensity without signs/symptoms of physical distress.   ?  ? Resistance Training  ? Training Prescription Yes   ? Weight 3 lb   ROM until 4//6: Recent ICD placement  ? Reps 10-15   ? ?  ?  ? ?  ? ? ?Perform Capillary Blood Glucose checks as needed. ? ?Exercise Prescription Changes: ? ? Exercise Prescription Changes   ? ? [place] Name 07/19/21 1500 08/09/21 1600 08/22/21 0900 08/29/21 1000 09/05/21 1500  ?  ? Response to Exercise  ? Blood Pressure (Admit) 104/64 118/60 136/64 -- 110/62  ? Blood Pressure (Exercise) 140/68 150/72 134/74 -- 122/64  ? Blood Pressure (Exit) 108/68 102/54 104/64 -- 102/58  ? Heart Rate (Admit) 75 bpm 93 bpm 94 bpm -- 82 bpm  ? Heart Rate (Exercise) 111 bpm 118 bpm 114 bpm -- 119 bpm  ? Heart Rate (Exit) 79 bpm 101 bpm 94 bpm -- 89 bpm  ? Oxygen Saturation (Admit) 97 % -- -- -- --   ? Oxygen Saturation (Exercise) 95 % -- -- -- --  ? Rating of Perceived Exertion (Exercise) '11 15 13 '$ -- 13  ? Perceived Dyspnea (Exercise) 1 -- -- -- --  ? Symptoms SOB, legs fatigued none none -- none  ? Co

## 2021-09-15 ENCOUNTER — Emergency Department: Payer: Medicare Other

## 2021-09-15 ENCOUNTER — Other Ambulatory Visit: Payer: Self-pay

## 2021-09-15 ENCOUNTER — Encounter: Payer: Self-pay | Admitting: Emergency Medicine

## 2021-09-15 ENCOUNTER — Inpatient Hospital Stay
Admission: EM | Admit: 2021-09-15 | Discharge: 2021-09-17 | DRG: 291 | Disposition: A | Discharge status: Home / Self Care | Payer: Medicare Other | Attending: Internal Medicine | Admitting: Internal Medicine

## 2021-09-15 DIAGNOSIS — Z951 Presence of aortocoronary bypass graft: Secondary | ICD-10-CM | POA: Diagnosis not present

## 2021-09-15 DIAGNOSIS — I5023 Acute on chronic systolic (congestive) heart failure: Secondary | ICD-10-CM | POA: Diagnosis present

## 2021-09-15 DIAGNOSIS — N189 Chronic kidney disease, unspecified: Secondary | ICD-10-CM | POA: Diagnosis present

## 2021-09-15 DIAGNOSIS — E7800 Pure hypercholesterolemia, unspecified: Secondary | ICD-10-CM | POA: Diagnosis present

## 2021-09-15 DIAGNOSIS — Z95810 Presence of automatic (implantable) cardiac defibrillator: Secondary | ICD-10-CM

## 2021-09-15 DIAGNOSIS — G4730 Sleep apnea, unspecified: Secondary | ICD-10-CM | POA: Diagnosis present

## 2021-09-15 DIAGNOSIS — Z7901 Long term (current) use of anticoagulants: Secondary | ICD-10-CM

## 2021-09-15 DIAGNOSIS — I959 Hypotension, unspecified: Secondary | ICD-10-CM | POA: Diagnosis present

## 2021-09-15 DIAGNOSIS — E860 Dehydration: Secondary | ICD-10-CM | POA: Diagnosis present

## 2021-09-15 DIAGNOSIS — Z8589 Personal history of malignant neoplasm of other organs and systems: Secondary | ICD-10-CM

## 2021-09-15 DIAGNOSIS — J810 Acute pulmonary edema: Secondary | ICD-10-CM | POA: Insufficient documentation

## 2021-09-15 DIAGNOSIS — I482 Chronic atrial fibrillation, unspecified: Secondary | ICD-10-CM | POA: Diagnosis present

## 2021-09-15 DIAGNOSIS — I255 Ischemic cardiomyopathy: Secondary | ICD-10-CM | POA: Diagnosis present

## 2021-09-15 DIAGNOSIS — Z881 Allergy status to other antibiotic agents status: Secondary | ICD-10-CM

## 2021-09-15 DIAGNOSIS — R0902 Hypoxemia: Secondary | ICD-10-CM | POA: Diagnosis present

## 2021-09-15 DIAGNOSIS — E1169 Type 2 diabetes mellitus with other specified complication: Secondary | ICD-10-CM | POA: Diagnosis present

## 2021-09-15 DIAGNOSIS — Z820 Family history of epilepsy and other diseases of the nervous system: Secondary | ICD-10-CM

## 2021-09-15 DIAGNOSIS — E1122 Type 2 diabetes mellitus with diabetic chronic kidney disease: Secondary | ICD-10-CM | POA: Diagnosis present

## 2021-09-15 DIAGNOSIS — I5021 Acute systolic (congestive) heart failure: Secondary | ICD-10-CM | POA: Diagnosis not present

## 2021-09-15 DIAGNOSIS — Z8673 Personal history of transient ischemic attack (TIA), and cerebral infarction without residual deficits: Secondary | ICD-10-CM

## 2021-09-15 DIAGNOSIS — K219 Gastro-esophageal reflux disease without esophagitis: Secondary | ICD-10-CM | POA: Diagnosis present

## 2021-09-15 DIAGNOSIS — I2510 Atherosclerotic heart disease of native coronary artery without angina pectoris: Secondary | ICD-10-CM | POA: Diagnosis present

## 2021-09-15 DIAGNOSIS — Z7982 Long term (current) use of aspirin: Secondary | ICD-10-CM

## 2021-09-15 DIAGNOSIS — Z832 Family history of diseases of the blood and blood-forming organs and certain disorders involving the immune mechanism: Secondary | ICD-10-CM | POA: Diagnosis not present

## 2021-09-15 DIAGNOSIS — K029 Dental caries, unspecified: Secondary | ICD-10-CM

## 2021-09-15 DIAGNOSIS — N179 Acute kidney failure, unspecified: Secondary | ICD-10-CM | POA: Diagnosis present

## 2021-09-15 DIAGNOSIS — Z7984 Long term (current) use of oral hypoglycemic drugs: Secondary | ICD-10-CM

## 2021-09-15 DIAGNOSIS — Z888 Allergy status to other drugs, medicaments and biological substances status: Secondary | ICD-10-CM

## 2021-09-15 DIAGNOSIS — Z8549 Personal history of malignant neoplasm of other male genital organs: Secondary | ICD-10-CM

## 2021-09-15 DIAGNOSIS — Z9049 Acquired absence of other specified parts of digestive tract: Secondary | ICD-10-CM | POA: Diagnosis not present

## 2021-09-15 DIAGNOSIS — Z8249 Family history of ischemic heart disease and other diseases of the circulatory system: Secondary | ICD-10-CM | POA: Diagnosis not present

## 2021-09-15 DIAGNOSIS — I130 Hypertensive heart and chronic kidney disease with heart failure and stage 1 through stage 4 chronic kidney disease, or unspecified chronic kidney disease: Secondary | ICD-10-CM | POA: Diagnosis present

## 2021-09-15 DIAGNOSIS — I10 Essential (primary) hypertension: Secondary | ICD-10-CM

## 2021-09-15 DIAGNOSIS — Z79899 Other long term (current) drug therapy: Secondary | ICD-10-CM

## 2021-09-15 DIAGNOSIS — I509 Heart failure, unspecified: Secondary | ICD-10-CM

## 2021-09-15 LAB — BRAIN NATRIURETIC PEPTIDE: B Natriuretic Peptide: 898.7 pg/mL — ABNORMAL HIGH (ref 0.0–100.0)

## 2021-09-15 LAB — CBC
HCT: 39.6 % (ref 39.0–52.0)
Hemoglobin: 12.8 g/dL — ABNORMAL LOW (ref 13.0–17.0)
MCH: 31.1 pg (ref 26.0–34.0)
MCHC: 32.3 g/dL (ref 30.0–36.0)
MCV: 96.1 fL (ref 80.0–100.0)
Platelets: 84 10*3/uL — ABNORMAL LOW (ref 150–400)
RBC: 4.12 MIL/uL — ABNORMAL LOW (ref 4.22–5.81)
RDW: 13.2 % (ref 11.5–15.5)
WBC: 6.2 10*3/uL (ref 4.0–10.5)
nRBC: 0 % (ref 0.0–0.2)

## 2021-09-15 LAB — COMPREHENSIVE METABOLIC PANEL
ALT: 15 U/L (ref 0–44)
AST: 18 U/L (ref 15–41)
Albumin: 3.8 g/dL (ref 3.5–5.0)
Alkaline Phosphatase: 59 U/L (ref 38–126)
Anion gap: 9 (ref 5–15)
BUN: 30 mg/dL — ABNORMAL HIGH (ref 8–23)
CO2: 23 mmol/L (ref 22–32)
Calcium: 8.3 mg/dL — ABNORMAL LOW (ref 8.9–10.3)
Chloride: 105 mmol/L (ref 98–111)
Creatinine, Ser: 1.72 mg/dL — ABNORMAL HIGH (ref 0.61–1.24)
GFR, Estimated: 41 mL/min — ABNORMAL LOW (ref >60)
Glucose, Bld: 94 mg/dL (ref 70–99)
Potassium: 4.2 mmol/L (ref 3.5–5.1)
Sodium: 137 mmol/L (ref 135–145)
Total Bilirubin: 0.7 mg/dL (ref 0.3–1.2)
Total Protein: 7.1 g/dL (ref 6.5–8.1)

## 2021-09-15 LAB — TROPONIN I (HIGH SENSITIVITY): Troponin I (High Sensitivity): 22 ng/L — ABNORMAL HIGH (ref <18)

## 2021-09-15 MED ORDER — POLYETHYLENE GLYCOL 3350 17 G PO PACK: 17.0000 g | PACK | Freq: Every day | ORAL | Status: DC | PRN

## 2021-09-15 MED ORDER — LISINOPRIL 5 MG PO TABS
2.5000 mg | ORAL_TABLET | Freq: Every day | ORAL | Status: DC
  Administered 2021-09-15: 2.5 mg via ORAL
  Filled 2021-09-15: qty 1

## 2021-09-15 MED ORDER — AMOXICILLIN 500 MG PO CAPS
500.0000 mg | ORAL_CAPSULE | Freq: Three times a day (TID) | ORAL | Status: DC
  Administered 2021-09-15 – 2021-09-17 (×6): 500 mg via ORAL
  Filled 2021-09-15 (×7): qty 1

## 2021-09-15 MED ORDER — TAMSULOSIN HCL 0.4 MG PO CAPS
0.8000 mg | ORAL_CAPSULE | Freq: Every day | ORAL | Status: DC
  Administered 2021-09-15 – 2021-09-17 (×3): 0.8 mg via ORAL
  Filled 2021-09-15 (×3): qty 2

## 2021-09-15 MED ORDER — ATORVASTATIN CALCIUM 80 MG PO TABS
80.0000 mg | ORAL_TABLET | Freq: Every day | ORAL | Status: DC
  Administered 2021-09-15 – 2021-09-17 (×3): 80 mg via ORAL
  Filled 2021-09-15: qty 4
  Filled 2021-09-15 (×2): qty 1

## 2021-09-15 MED ORDER — FUROSEMIDE 10 MG/ML IJ SOLN
80.0000 mg | Freq: Once | INTRAMUSCULAR | Status: AC
  Administered 2021-09-15: 80 mg via INTRAVENOUS
  Filled 2021-09-15: qty 8

## 2021-09-15 MED ORDER — FUROSEMIDE 10 MG/ML IJ SOLN
40.0000 mg | Freq: Two times a day (BID) | INTRAMUSCULAR | Status: DC
Start: 2021-09-15 — End: 2021-09-16
  Administered 2021-09-16: 40 mg via INTRAVENOUS
  Filled 2021-09-15: qty 4

## 2021-09-15 MED ORDER — ONDANSETRON HCL 4 MG/2ML IJ SOLN
4.0000 mg | Freq: Four times a day (QID) | INTRAMUSCULAR | Status: DC | PRN
Start: 2021-09-15 — End: 2021-09-17

## 2021-09-15 MED ORDER — METOPROLOL SUCCINATE ER 25 MG PO TB24
12.5000 mg | ORAL_TABLET | Freq: Every day | ORAL | Status: DC
  Administered 2021-09-17: 12.5 mg via ORAL
  Filled 2021-09-15: qty 0.5
  Filled 2021-09-15 (×2): qty 1

## 2021-09-15 MED ORDER — OXYCODONE HCL 5 MG PO TABS
5.0000 mg | ORAL_TABLET | Freq: Once | ORAL | Status: AC
Start: 2021-09-15 — End: 2021-09-15
  Administered 2021-09-15: 5 mg via ORAL
  Filled 2021-09-15: qty 1

## 2021-09-15 MED ORDER — ACETAMINOPHEN 325 MG PO TABS
650.0000 mg | ORAL_TABLET | Freq: Four times a day (QID) | ORAL | Status: DC | PRN
  Administered 2021-09-15: 650 mg via ORAL
  Filled 2021-09-15: qty 2

## 2021-09-15 MED ORDER — PANTOPRAZOLE SODIUM 40 MG PO TBEC
40.0000 mg | DELAYED_RELEASE_TABLET | Freq: Every day | ORAL | Status: DC
  Administered 2021-09-15 – 2021-09-17 (×3): 40 mg via ORAL
  Filled 2021-09-15 (×3): qty 1

## 2021-09-15 MED ORDER — ASPIRIN 81 MG PO TBEC
81.0000 mg | DELAYED_RELEASE_TABLET | Freq: Every day | ORAL | Status: DC
  Administered 2021-09-15 – 2021-09-17 (×3): 81 mg via ORAL
  Filled 2021-09-15 (×3): qty 1

## 2021-09-15 MED ORDER — APIXABAN 5 MG PO TABS
5.0000 mg | ORAL_TABLET | Freq: Two times a day (BID) | ORAL | Status: DC
  Administered 2021-09-15 – 2021-09-17 (×5): 5 mg via ORAL
  Filled 2021-09-15 (×5): qty 1

## 2021-09-15 MED ORDER — ACETAMINOPHEN 650 MG RE SUPP: 650.0000 mg | Freq: Four times a day (QID) | RECTAL | Status: DC | PRN

## 2021-09-15 MED ORDER — TRAZODONE HCL 50 MG PO TABS
25.0000 mg | ORAL_TABLET | Freq: Every evening | ORAL | Status: DC | PRN
  Administered 2021-09-15: 25 mg via ORAL
  Filled 2021-09-15: qty 1

## 2021-09-15 MED ORDER — ONDANSETRON HCL 4 MG PO TABS: 4.0000 mg | ORAL_TABLET | Freq: Four times a day (QID) | ORAL | Status: DC | PRN

## 2021-09-15 MED ORDER — AMIODARONE HCL 200 MG PO TABS
200.0000 mg | ORAL_TABLET | Freq: Every day | ORAL | Status: DC
  Administered 2021-09-15 – 2021-09-17 (×3): 200 mg via ORAL
  Filled 2021-09-15 (×3): qty 1

## 2021-09-15 NOTE — ED Triage Notes (Signed)
Pt via POV from home. Pt c/o SOB since October since his CABG, pt has hx of CHF. Pt has been seen multiple times since then. States that now he has some distention in his abd but states that he has been taking his diuretics. Wife reports 8 lb weight gain for the past 3 days. Pt is on Eliquis. Pt is A&Ox4 and NAD, audible wheezing on arrival.

## 2021-09-15 NOTE — ED Notes (Signed)
Report given to [place], RN 

## 2021-09-15 NOTE — Progress Notes (Signed)
Pt stated he had HF booklet at home and requested to not have another book.  Will continue to provide education.

## 2021-09-15 NOTE — H&P (Signed)
History and Physical    Patient: Jermaine Kramer:[id number] DOB: 11-08-1950 DOA: 09/15/2021 DOS: the patient was seen and examined on 09/15/2021 PCP: Sherre Scarlet, PA-C  Patient coming from: Home  Chief Complaint:  Chief Complaint  Patient presents with   Shortness of Breath   HPI: Jermaine Kramer is a 71 y.o. male with medical history significant of systolic congestive heart failure with EF of 30%, moderate MR, history of CAD with recent CABG in October 2022, postop atrial fibrillation, status post dual chamber ICD placement in March 2023, history of frequent PVCs, type II diabetes, hypertension comes to the emergency room today with 8 pound weight gain over three days refractory to PO diuretics and worsening shortness of breath  patient was found to have elevated BNP of 893. Patient try taking extra 20 mg of Lasix on count normal 20 mg without any change. He had significant abdominal bloating and distention. Recently had diagnosis of ileus. Has some constipation and irregular bowel habits  patient is being admitted for further evaluation of acute on chronic congestive heart failure systolic. He received IV Lasix 80 mg in the ER. Currently sats are 96% on room air. Review of Systems: As mentioned in the history of present illness. All other systems reviewed and are negative. Past Medical History:  Diagnosis Date   Arrhythmia    atrial fibrillation   CHF (congestive heart failure) (HCC)    Coronary artery disease    Diabetes mellitus without complication (HCC)    GERD (gastroesophageal reflux disease)    Hypercholesteremia    Hypertension    Sleep apnea    Stroke [facility])    Past Surgical History:  Procedure Laterality Date   CHOLECYSTECTOMY     COLONOSCOPY WITH PROPOFOL N/A 04/14/2019   Procedure: COLONOSCOPY WITH PROPOFOL;  Surgeon: Toledo, Benay Pike, MD;  Location: [facility] ENDOSCOPY;  Service: Gastroenterology;  Laterality: N/A;   CORONARY ARTERY BYPASS GRAFT  02/16/2021    ICD IMPLANT N/A 06/22/2021   Procedure: ICD IMPLANT;  Surgeon: Vickie Epley, MD;  Location: [place] [facility];  Service: Cardiovascular;  Laterality: N/A;   KNEE ARTHROSCOPY     RENAL CYST EXCISION     Social History:  reports that he has never smoked. He has never used smokeless tobacco. He reports that he does not currently use alcohol. He reports that he does not use drugs.  Allergies  Allergen Reactions   Doxycycline Anaphylaxis    Patient does not recall having this reaction.     Heparin Other (See Comments)    heparin-induced thrombocytopenia    Empagliflozin Itching    Groin infection Caused a groin infection.      Family History  Problem Relation Age of Onset   Parkinson's disease Mother    Lupus Mother    Heart disease Father     Prior to Admission medications   Medication Sig Start Date End Date Taking? Authorizing Provider  amiodarone (PACERONE) 200 MG tablet Take 200 mg by mouth daily. 03/06/21  Yes [provider]  amoxicillin (AMOXIL) 500 MG tablet Take 500 mg by mouth every 8 (eight) hours. 09/14/21  Yes [provider]  apixaban (ELIQUIS) 5 MG TABS tablet Take 1 tablet (5 mg total) by mouth 2 (two) times daily. DO NOT RESTART UNTIL 06/27/2021. 06/22/21  Yes Dunn, Areta Haber, PA-C  aspirin EC 81 MG tablet Take 81 mg by mouth daily.   Yes [provider]  atorvastatin (LIPITOR) 80 MG tablet Take  80 mg by mouth daily. 02/10/21  Yes [provider]  furosemide (LASIX) 40 MG tablet Take 1 tablet (40 mg total) by mouth daily. Patient taking differently: Take 20 mg by mouth daily. 05/18/21  Yes Jennye Boroughs, MD  lisinopril (ZESTRIL) 2.5 MG tablet Take 1 tablet (2.5 mg total) by mouth daily. 06/02/21  Yes Darylene Price A, FNP  metFORMIN (GLUCOPHAGE) 1000 MG tablet Take 1,000 mg by mouth 2 (two) times daily with a meal.   Yes [provider]  metoprolol succinate (TOPROL-XL) 25 MG 24 hr tablet Take 12.5 mg by mouth daily.   Yes  [provider]  omeprazole (PRILOSEC) 40 MG capsule Take 40 mg by mouth daily. 03/11/21  Yes [provider]  tamsulosin (FLOMAX) 0.4 MG CAPS capsule Take 0.8 mg by mouth daily.   Yes [provider]  glucose blood (PRECISION QID TEST) test strip Use 3 (three) times daily Use as instructed. One touch ultra 07/05/21 07/05/22  [provider]    Physical Exam: Vitals:   09/15/21 1030 09/15/21 1130 09/15/21 1200 09/15/21 1300  BP: 115/77 110/78 100/79   Pulse: 73 71 75 76  Resp: '17 20 12 11  '$ Temp:      TempSrc:      SpO2: 95% 92% 95% 95%  Weight:      Height:       alert and oriented times three not an acute distress. Cardiovascular heart sounds are normal rate rhythm regular no murmur. Respiratory decreased breath sounds bilaterally basically. No Rolitta rhonchi. No respiratory distress abdomen soft benign nontender lower extremity bilateral 2+ pitting edema neuro- patient alert oriented times three  Assessment and Plan:  Jermaine Kramer is a 71 y.o. male with medical history significant of systolic congestive heart failure with EF of 30%, moderate MR, history of CAD with recent CABG in October 2022, postop atrial fibrillation, status post dual chamber ICD placement in March 2023, history of frequent PVCs, type II diabetes, hypertension comes to the emergency room today with 8 pound weight gain over three days refractory to PO diuretics and worsening shortness of breath  acute on chronic systolic congestive heart failure with history of dilated cardiomyopathy EF 30 to 35% status post dual chamber ICD placement -- came in with shortness of breath, exertional hypoxia sats in the 80s, elevated BNP and 8 pound weight gain over 3 to 4 days -- IV Lasix 80 mg in the ER--- will continue 40 mg BID -- continue lisinopril, metoprolol -- monitor input output -- cardiology consultation with Dr. Clayborn Bigness  type II diabetes with hyperlipidemia -- continue sliding  scale insulin -- recent A1c 7.8-- continue statins  chronic a fib -- on metoprolol and eliquis -- continue amiodarone  history of CAD status post CABG October 2022 -- continue statins and aspirin    Advance Care Planning:   Code Status: Full Code   Consults: [facility]  Family Communication: wife Butch Penny on the phone  Severity of Illness: The appropriate patient status for this patient is INPATIENT. Inpatient status is judged to be reasonable and necessary in order to provide the required intensity of service to ensure the patient's safety. The patient's presenting symptoms, physical exam findings, and initial radiographic and laboratory data in the context of their chronic comorbidities is felt to place them at high risk for further clinical deterioration. Furthermore, it is not anticipated that the patient will be medically stable for discharge from the hospital within 2 midnights of admission.   *  I certify that at the point of admission it is my clinical judgment that the patient will require inpatient hospital care spanning beyond 2 midnights from the point of admission due to high intensity of service, high risk for further deterioration and high frequency of surveillance required.*  Author: Fritzi Mandes, MD 09/15/2021 1:51 PM  For on call review [email or web address].

## 2021-09-15 NOTE — Consult Note (Signed)
[place] NOTE       Patient ID: Jermaine Kramer MRN: [id number] DOB/AGE: 73-07-1950 73 y.o.  Admit date: 09/15/2021 Referring Physician Dr. Fritzi Mandes Primary Physician Dr. Sena Hitch [facility] PCP Primary Cardiologist Dr. Donnelly Angelica Reason for Consultation acute on chronic HFrEF  HPI: Jermaine Kramer is a 71 year old male with a past medical history of CAD (s/p recent CABG with LIMA to LAD, SVG to ramus and SVG to PDA on 02/16/2021 with Dr. Rosalita Chessman), postoperative atrial fibrillation (on amiodarone and Eliquis, dilated cardiomyopathy with LVEF 30-35%, mod MR, 07/22/2021 s/p dual chamber ICD 06/22/21, history of frequent PVCs, type 2 diabetes, history of TIA, history of penile cancer, hypertension who presented to [facility] ED 09/15/2021 with an 8 pound weight gain over 3 days refractory to p.o. diuretics and worsening shortness of breath.  Cardiology is consulted for assistance with his heart failure.  The patient presents with his wife who contributes to the history. She states the patient gained 8 pounds in the last three days and was more short a breath. He tried to take an extra 3m of lasix on top of his normal 234mdaily without improvement in his weight or symptoms. He has significant abdominal distention and bloating over the same time period and has be nauseated for a few months. He states his bowel movements aren't as frequent as they used to be, having one every couple of days compared to the first thing in the morning in the past. Last BM was 2 days ago. Was seen by his PCP two days ago who started him on miralax. At my time of evaluation he is sitting in a chair with is wife at bedside. He got 8053mf lasix and urinated 720m2mnd feels a bit better but still very bloated and with abdominal swelling (where he normally holds all of his fluid with CHF). He has chronic 2 pillow orthopnea but denies chest pain, palpitations, presyncope, LE swelling. Has been participating in cardiac  rehab regularly.   Labs are notable for a potassium of 4.2, BUN/Cr 30/1.72, eGFR 41, LFTS wnl. BNP elevated at 898 compared to three weeks ago at 686. Initial troponin 22. H/H stable at 12.8/39. thrombocytopenia to with plts 84, (was 138 two months ago).   Vitals are notable for a BP of 100/79, SPO2 93% on room air. HR 76 on telemetry.   Review of systems complete and found to be negative unless listed above     Past Medical History:  Diagnosis Date   Arrhythmia    atrial fibrillation   CHF (congestive heart failure) (HCC)    Coronary artery disease    Diabetes mellitus without complication (HCC)    GERD (gastroesophageal reflux disease)    Hypercholesteremia    Hypertension    Sleep apnea    [facility]  Past Surgical History:  Procedure Laterality Date   CHOLECYSTECTOMY     COLONOSCOPY WITH PROPOFOL N/A 04/14/2019   Procedure: COLONOSCOPY WITH PROPOFOL;  Surgeon: Toledo, TeodBenay Pike;  Location: [facility] ENDOSCOPY;  Service: Gastroenterology;  Laterality: N/A;   CORONARY ARTERY BYPASS GRAFT  02/16/2021   ICD IMPLANT N/A 06/22/2021   Procedure: ICD IMPLANT;  Surgeon: LambVickie Epley;  [facility];  Service: Cardiovascular;  Laterality: N/A;   KNEE ARTHROSCOPY     RENAL CYST EXCISION      (Not in a hospital admission)  Social History   Socioeconomic History   Marital status: Married  Spouse name: Not on file   Number of children: Not on file   Years of education: Not on file   Highest education level: Not on file  Occupational History   Not on file  Tobacco Use   Smoking status: Never   Smokeless tobacco: Never  Vaping Use   Vaping Use: Never used  Substance and Sexual Activity   Alcohol use: Not Currently   Drug use: No   Sexual activity: Not on file  Other Topics Concern   Not on file  Social History Narrative   Not on file   Social Determinants of Health   Financial Resource Strain: Not on file  Food Insecurity: Not on file   Transportation Needs: Not on file  Physical Activity: Not on file  Stress: Not on file  Social Connections: Not on file  Intimate Partner Violence: Not on file    Family History  Problem Relation Age of Onset   Parkinson's disease Mother    Lupus Mother    Heart disease Father       PHYSICAL EXAM General: Pleasant elderly caucasian male, well nourished, in no acute distress.Sitting in chair with wife at bedside.  HEENT:  Normocephalic and atraumatic. Neck:  No JVD.  Lungs: Normal respiratory effort on room air. Clear bilaterally to auscultation. Decreased breath sounds in left base.  Heart: HRRR . Normal S1 and S2 without gallops or murmurs. Radial & DP pulses 2+ bilaterally. Abdomen: Distended and tight, nontender to palpation.  Msk: Normal strength and tone for age. Extremities: Warm and well perfused. No clubbing, cyanosis. No peripheral edema.  Neuro: Alert and oriented X 3. Psych:  Answers questions appropriately.   Labs:   Lab Results  Component Value Date   WBC 6.2 09/15/2021   HGB 12.8 (L) 09/15/2021   HCT 39.6 09/15/2021   MCV 96.1 09/15/2021   PLT 84 (L) 09/15/2021    Recent Labs  Lab 09/15/21 0831  NA 137  K 4.2  CL 105  CO2 23  BUN 30*  CREATININE 1.72*  CALCIUM 8.3*  PROT 7.1  BILITOT 0.7  ALKPHOS 59  ALT 15  AST 18  GLUCOSE 94   Lab Results  Component Value Date   TROPONINI < 0.02 10/19/2013   No results found for: CHOL No results found for: HDL No results found for: LDLCALC No results found for: TRIG No results found for: CHOLHDL No results found for: LDLDIRECT    Radiology: DG Chest 2 View  Result Date: 09/15/2021 CLINICAL DATA:  A 71 year old male presents for about UA shin of shortness of breath. EXAM: CHEST - 2 VIEW COMPARISON:  July 24, 2021. FINDINGS: EKG leads project over the patient's chest. Post median sternotomy for CABG. Heart size is stable. Dual lead pacer defibrillator remains in place with power pack over the LEFT  chest. No sign of lobar consolidation. Subtle basilar airspace disease bilaterally. No gross pleural effusion. Subtle nodular density at the RIGHT costodiaphragmatic sulcus measuring 8 mm without correlate on recent chest CT. Mild increased interstitial markings.  No visible pneumothorax. On limited assessment no acute skeletal process. IMPRESSION: 1. Query mild edema. 2. Subtle basilar airspace disease bilaterally. Findings could represent atelectasis versus mild pneumonitis or infection 3. Subtle nodular density at the RIGHT costodiaphragmatic sulcus on the frontal projection is of uncertain significance potentially inflammatory or related to atelectatic changes. Suggest PA and lateral chest radiograph on follow-up to ensure resolution. No correlate on recent chest CT from July 24, 2021. Electronically  Signed   By: Zetta Bills M.D.   On: 09/15/2021 09:06   DG Abdomen 1 View  Result Date: 09/15/2021 CLINICAL DATA:  Distension, dyspnea EXAM: ABDOMEN - 1 VIEW COMPARISON:  07/27/2021 abdominal radiograph FINDINGS: A few mildly dilated small bowel loops in the central abdomen up to 3.8 cm diameter. No evidence of pneumatosis or pneumoperitoneum. Mild colorectal stool volume. Cholecystectomy clips are seen in the right upper quadrant of the abdomen. No radiopaque nephrolithiasis. IMPRESSION: A few mildly dilated small bowel loops in the central abdomen, cannot exclude mild/early mid to distal small bowel obstruction. Further evaluation with CT abdomen/pelvis with oral and IV contrast may be obtained as clinically warranted. Electronically Signed   By: Ilona Sorrel M.D.   On: 09/15/2021 09:05    ECHO 07/25/2021 DOPPLER ECHO and OTHER SPECIAL PROCEDURES                 Aortic: No AR                      No AS                         136.0 cm/sec peak vel      7.4 mmHg peak grad                         4.0 mmHg mean grad         2.0 cm^2 by DOPPLER                 Mitral: MODERATE MR                No MS                          392.0 cm/sec peak vel      61.5 mmHg peak grad                         41.0 mmHg mean grad                         MV Inflow E Vel = 106.0 cm/sec      MV Annulus E'Vel = 4.0 cm/sec                         E/E'Ratio = 26.5              Tricuspid: MILD TR                    No TS                         196.0 cm/sec peak TR vel              Pulmonary: No PR                      No PS                         83.4 cm/sec peak vel       2.8 mmHg peak grad  _________________________________________________________________________________________  INTERPRETATION  MODERATE LV SYSTOLIC DYSFUNCTION (See above)   WITH MILD LVH  MODERATE RV SYSTOLIC DYSFUNCTION (See above)  MODERATE  VALVULAR REGURGITATION (See above)  NO VALVULAR STENOSIS  ESTIMATED LVEF 30-35% (CALCULATED: 32.9%)  GLS: -4.3%  Mitral: MODERATE MR  MILD LAE  MILDLY DILATED ASCENDING AORTA MEASURING 3.5cm   TELEMETRY reviewed by me: sinus rhythm rate 70s  EKG reviewed by me: sinus rhythm RBBB rate 77 similar to prior  ASSESSMENT AND PLAN:  Jermaine Kramer is a 71 year old male with a past medical history of CAD (s/p recent CABG with LIMA to LAD, SVG to ramus and SVG to PDA on 02/16/2021 with Dr. Rosalita Chessman), postoperative atrial fibrillation (on amiodarone and Eliquis, dilated cardiomyopathy with LVEF 30-35%, mod MR, 07/22/2021 s/p dual chamber ICD 06/22/21, history of frequent PVCs, type 2 diabetes, history of TIA, history of penile cancer, hypertension who presented to [facility] ED 09/15/2021 with an 8 pound weight gain over 3 days refractory to p.o. diuretics and worsening shortness of breath.  Cardiology is consulted for assistance with his heart failure.  #acute on chronic HFrEF (LVEF 30-35%, mod MR 06/2021) The patient presents with a three day history of shortness of breath and an 8 pound weight gain refractory to taking an extra lasix 28m. He has been naueated with significant abdominal swelling/bloating over the past  three days (where he holds all of his fluids) and his abdomen is very distended and tight on exam. BNP is elevated to 900 compared to 686 three weeks ago. Trying to avoid salt and limit his fluid intake. His abdominal symptoms and nausea are likely related to volume overload.  -s/p IV lasix [id number], continue 4062mV lasix BID for now  -daily BMP -continue lisinopril 2.5mg6mily and metoprolol XL 12.5, only tolerates low doses of GDMT due to hypotention -strict I/O, daily weights. Dry weight is 185lbs (193.6 on admission)  -echocardiogram complete -will continue to follow closely   #AKI on CKD Renal function on admission with Cr. 1.72 and GFR 41 (baseline from 08/16/2021 Cr 1.34 and GFR 56)  #CAD s/p CABG 01/2021 -continue atorvastatin and aspirin with close monitoring of his platelets  #postoperative atrial fibrillation (s/p CABG) Remains in sinus rhythm, continue amiodarone 200mg67me daily and eliquis 5mg B70mfor stroke risk reduction   This patient's plan of care was discussed and created with Dr. CallwoClayborn Bignesse is in agreement.  Signed: Genova Kramer MTristan SchroederC 09/15/2021, 12:07 PM [facility]

## 2021-09-15 NOTE — Plan of Care (Signed)
  Problem: Education: Goal: Ability to verbalize understanding of medication therapies will improve Outcome: Progressing Goal: Individualized Educational Video(s) Outcome: Progressing

## 2021-09-15 NOTE — ED Notes (Signed)
Pt taken for xray

## 2021-09-15 NOTE — ED Notes (Signed)
Dr Kinner at bedside. 

## 2021-09-15 NOTE — ED Provider Notes (Signed)
[facility] Provider Note    Event Date/Time   First MD Initiated Contact with Patient 09/15/21 [phone]     (approximate)   History   Shortness of Breath   HPI  Jermaine Kramer is a 71 y.o. male with a history of CHF, CAD, diabetes, hypertension, sleep apnea status post CABG who presents with complaints of shortness of breath and weight gain.  Patient reports compliance with all of his medications including his diuretics but despite that has gained 8 pounds over the last several days.  He feels increasing shortness of breath, denies chest pain.  Also reports bloated abdomen over the last several months, during hospitalization was diagnosed with an ileus, did have a bowel movement 2 days ago, no abdominal pain.     Physical Exam   Triage Vital Signs: ED Triage Vitals  Enc Vitals Group     BP 09/15/21 0809 129/78     Pulse Rate 09/15/21 0809 78     Resp 09/15/21 0809 20     Temp 09/15/21 0809 98.4 F (36.9 C)     Temp Source 09/15/21 0809 Oral     SpO2 09/15/21 0809 95 %     Weight 09/15/21 0804 88 kg (194 lb)     Height 09/15/21 0804 1.702 m ('5\' 7"'$ )     Head Circumference --      Peak Flow --      Pain Score 09/15/21 0804 0     Pain Loc --      Pain Edu? --      Excl. in [place]? --     Most recent vital signs: Vitals:   09/15/21 0809 09/15/21 0949  BP: 129/78 101/64  Pulse: 78 69  Resp: 20 13  Temp: 98.4 F (36.9 C)   SpO2: 95% 98%     General: Awake, no distress.  CV:  Good peripheral perfusion.  Resp:  Normal effort.  Abd:  Distended abdomen, nontender Other:  1+ edema bilaterally   ED Results / Procedures / Treatments   Labs (all labs ordered are listed, but only abnormal results are displayed) Labs Reviewed  CBC - Abnormal; Notable for the following components:      Result Value   RBC 4.12 (*)    Hemoglobin 12.8 (*)    Platelets 84 (*)    All other components within normal limits  BRAIN NATRIURETIC PEPTIDE - Abnormal; Notable  for the following components:   B Natriuretic Peptide 898.7 (*)    All other components within normal limits  COMPREHENSIVE METABOLIC PANEL - Abnormal; Notable for the following components:   BUN 30 (*)    Creatinine, Ser 1.72 (*)    Calcium 8.3 (*)    GFR, Estimated 41 (*)    All other components within normal limits  TROPONIN I (HIGH SENSITIVITY) - Abnormal; Notable for the following components:   Troponin I (High Sensitivity) 22 (*)    All other components within normal limits     EKG  ED ECG REPORT I, Lavonia Drafts, the attending physician, personally viewed and interpreted this ECG.  Date: 09/15/2021  Rhythm: normal sinus rhythm QRS Axis: normal Intervals: Right bundle branch block ST/T Wave abnormalities: normal Narrative Interpretation: no evidence of acute ischemia    RADIOLOGY Chest x-ray interpreted by me, suspicious for pulmonary edema pending radiology read   PROCEDURES:  Critical Care performed:   Procedures   MEDICATIONS ORDERED IN ED: Medications  oxyCODONE (Oxy IR/ROXICODONE) immediate release tablet  5 mg (has no administration in time range)  furosemide (LASIX) injection 80 mg (80 mg Intravenous Given 09/15/21 0950)     IMPRESSION / MDM / ASSESSMENT AND PLAN / ED COURSE  I reviewed the triage vital signs and the nursing notes. Patient's presentation is most consistent with acute presentation with potential threat to life or bodily function.   Patient presents with 8 pound weight gain, increasing shortness of breath.  Suspicious for exacerbation of CHF, pulmonary edema, less likely ACS  Abdominal distention likely related to continued ileus although is having bowel movements and no abdominal pain.  Could also be related to abdominal edema  Reviewed CT scan performed on 07/24/2021 which not show any acute abnormalities, some possibility of sclerosing mesenteritis  We will obtain labs chest x-ray KUB  Chest x-ray suspicious for mild pulmonary  edema, oxygen saturations stay above 90% while sitting but if he lies down he feels more short of breath and saturations are in the low 90s, if he ambulates they go less than 90%  We will give IV Lasix 80 mg, patient will require admission for diuresis, have consulted the hospitalist            FINAL CLINICAL IMPRESSION(S) / ED DIAGNOSES   Final diagnoses:  Acute on chronic congestive heart failure, unspecified heart failure type ([place])  Acute pulmonary edema ([place])     Rx / DC Orders   ED Discharge Orders     None        Note:  This document was prepared using Dragon voice recognition software and may include unintentional dictation errors.   Lavonia Drafts, MD 09/15/21 1017

## 2021-09-16 ENCOUNTER — Inpatient Hospital Stay
Admit: 2021-09-16 | Discharge: 2021-09-16 | Disposition: A | Discharge status: Home / Self Care | Payer: Medicare Other | Attending: Cardiology | Admitting: Cardiology

## 2021-09-16 DIAGNOSIS — J810 Acute pulmonary edema: Secondary | ICD-10-CM | POA: Diagnosis not present

## 2021-09-16 DIAGNOSIS — K029 Dental caries, unspecified: Secondary | ICD-10-CM

## 2021-09-16 DIAGNOSIS — I5021 Acute systolic (congestive) heart failure: Secondary | ICD-10-CM | POA: Diagnosis not present

## 2021-09-16 DIAGNOSIS — E1169 Type 2 diabetes mellitus with other specified complication: Secondary | ICD-10-CM | POA: Diagnosis not present

## 2021-09-16 LAB — ECHOCARDIOGRAM COMPLETE
AR max vel: 1.12 cm2
AV Area VTI: 1.07 cm2
AV Area mean vel: 1.03 cm2
AV Mean grad: 7 mmHg
AV Peak grad: 12.3 mmHg
Ao pk vel: 1.76 m/s
Area-P 1/2: 4.99 cm2
Calc EF: 33.7 %
Height: 67 in
MV VTI: 1.18 cm2
S' Lateral: 5.3 cm
Single Plane A2C EF: 31.9 %
Single Plane A4C EF: 33.9 %
Weight: 3093.49 oz

## 2021-09-16 LAB — BASIC METABOLIC PANEL
Anion gap: 10 (ref 5–15)
BUN: 26 mg/dL — ABNORMAL HIGH (ref 8–23)
CO2: 25 mmol/L (ref 22–32)
Calcium: 8.4 mg/dL — ABNORMAL LOW (ref 8.9–10.3)
Chloride: 103 mmol/L (ref 98–111)
Creatinine, Ser: 1.53 mg/dL — ABNORMAL HIGH (ref 0.61–1.24)
GFR, Estimated: 48 mL/min — ABNORMAL LOW (ref >60)
Glucose, Bld: 115 mg/dL — ABNORMAL HIGH (ref 70–99)
Potassium: 3.9 mmol/L (ref 3.5–5.1)
Sodium: 138 mmol/L (ref 135–145)

## 2021-09-16 MED ORDER — POLYETHYLENE GLYCOL 3350 17 G PO PACK
17.0000 g | PACK | Freq: Every day | ORAL | Status: DC
  Administered 2021-09-16 – 2021-09-17 (×2): 17 g via ORAL
  Filled 2021-09-16 (×2): qty 1

## 2021-09-16 MED ORDER — OXYCODONE HCL 5 MG PO TABS
5.0000 mg | ORAL_TABLET | Freq: Three times a day (TID) | ORAL | Status: DC | PRN
  Administered 2021-09-16: 5 mg via ORAL
  Filled 2021-09-16: qty 1

## 2021-09-16 MED ORDER — BISACODYL 10 MG RE SUPP: 10.0000 mg | Freq: Every day | RECTAL | Status: DC

## 2021-09-16 MED ORDER — FUROSEMIDE 10 MG/ML IJ SOLN
60.0000 mg | Freq: Two times a day (BID) | INTRAMUSCULAR | Status: DC
  Administered 2021-09-16: 60 mg via INTRAVENOUS
  Filled 2021-09-16: qty 6

## 2021-09-16 MED ORDER — LACTULOSE 10 GM/15ML PO SOLN
20.0000 g | Freq: Two times a day (BID) | ORAL | Status: DC
  Administered 2021-09-16: 20 g via ORAL
  Filled 2021-09-16 (×2): qty 30

## 2021-09-16 MED ORDER — BISACODYL 5 MG PO TBEC
5.0000 mg | DELAYED_RELEASE_TABLET | Freq: Every day | ORAL | Status: DC
  Administered 2021-09-16: 5 mg via ORAL

## 2021-09-16 NOTE — Plan of Care (Signed)
Nutrition Education Note  RD consulted for nutrition education regarding CHF.  Spoke with pt and wife at bedside. Pt shares that he had a CABG at [facility] back in October and has made significant changes to his lifestyle. Pt has detailed records of his weight, CBGS, and BP that he records daily. Pt shares he follows a low sodium diet at home, but admits that this is a struggle for him as a lot of his favorite foods (tomato soup and fast food salads) do not meet confines of his meal plan. Pt and wife very grateful for support of the heart failure clinic.  Reviewed CHF management with pt and wife. They have no further questions at this time. Pt already has Heart Failure booklet.   Nutrition-Focused physical exam completed. Findings are no fat depletion, no muscle depletion, and moderate edema.    RD provided "Low Sodium Nutrition Therapy" handout from the Academy of Nutrition and Dietetics. Reviewed patient's dietary recall. Provided examples on ways to decrease sodium intake in diet. Discouraged intake of processed foods and use of salt shaker. Encouraged fresh fruits and vegetables as well as whole grain sources of carbohydrates to maximize fiber intake.   RD discussed why it is important for patient to adhere to diet recommendations, and emphasized the role of fluids, foods to avoid, and importance of weighing self daily. Teach back method used.  Expect fair to good compliance.  Body mass index is 30.28 kg/m. Pt meets criteria for obesity, class I based on current BMI. Obesity is a complex, chronic medical condition that is optimally managed by a multidisciplinary care team. Weight loss is not an ideal goal for an acute inpatient hospitalization. However, if further work-up for obesity is warranted, consider outpatient referral to outpatient bariatric service and/or [facility].    Current diet order is Heart Healthy (liberalized to 2 gram sodium), patient  is consuming approximately 100% of meals at this time. Labs and medications reviewed. No further nutrition interventions warranted at this time. RD contact information provided. If additional nutrition issues arise, please re-consult RD.   Loistine Chance, RD, LDN, [place] Registered Dietitian II Certified Diabetes Care and Education Specialist Please refer to [facility] for RD and/or RD on-call/weekend/after hours pager

## 2021-09-16 NOTE — TOC Initial Note (Signed)
Transition of Care [facility]) - Initial/Assessment Note    Patient Details  Name: Jermaine Kramer MRN: [id number] Date of Birth: [birth year]  Transition of Care [facility]) CM/SW Contact:    Laurena Slimmer, RN Phone Number: 09/16/2021, 4:14 PM  Clinical Narrative:                  Transition of Care [facility]) Screening Note   Patient Details  Name: Jermaine Kramer Date of Birth: Sep 11, 1950   Transition of Care [facility]) CM/SW Contact:    Laurena Slimmer, RN Phone Number: 09/16/2021, 4:14 PM    Transition of Care Department [facility]) has reviewed patient and no TOC needs have been identified at this time. We will continue to monitor patient advancement through interdisciplinary progression rounds. If new patient transition needs arise, please place a TOC consult.          Patient Goals and CMS Choice        Expected Discharge Plan and Services                                                Prior Living Arrangements/Services                       Activities of Daily Living Home Assistive Devices/Equipment: Eyeglasses ADL Screening (condition at time of admission) Patient's cognitive ability adequate to safely complete daily activities?: Yes Is the patient deaf or have difficulty hearing?: No Does the patient have difficulty seeing, even when wearing glasses/contacts?: No Does the patient have difficulty concentrating, remembering, or making decisions?: No Patient able to express need for assistance with ADLs?: Yes Does the patient have difficulty dressing or bathing?: No Independently performs ADLs?: Yes (appropriate for developmental age) Does the patient have difficulty walking or climbing stairs?: No Weakness of Legs: None Weakness of Arms/Hands: None  Permission Sought/Granted                  Emotional Assessment              Admission diagnosis:  Acute pulmonary edema (HCC) [J81.0] Acute CHF (congestive heart failure) ([place]) [I50.9] Acute on  chronic congestive heart failure, unspecified heart failure type ([place]) [I50.9] Patient Active Problem List   Diagnosis Date Noted   Dental caries    Acute pulmonary edema ([place])    Left flank pain 07/24/2021   Stroke ([place]) 07/24/2021   Chronic combined systolic and diastolic congestive heart failure ([place]) 07/24/2021   Atrial fibrillation, chronic ([place]) 07/24/2021   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   Kidney stone on left side 07/24/2021   HFrEF (heart failure with reduced ejection fraction) ([place]) 06/22/2021   Ischemic cardiomyopathy 06/22/2021   S/P CABG x 3 06/22/2021   Hyperlipidemia LDL goal <70 06/22/2021   Acute CHF (congestive heart failure) ([place]) 05/14/2021   Incomplete emptying of bladder 05/04/2021   Acute respiratory failure with hypoxia ([place])    Troponin I above reference range    COVID 04/21/2021   Postoperative atrial fibrillation ([place]) 04/21/2021   DM (diabetes mellitus), type 2 with coma ([place])    GERD (gastroesophageal reflux disease)    Hypertension    Acute parotitis 02/27/2021   Volume overload 02/19/2021   Thrombocytopenia ([place]) 02/18/2021   Receiving inotropic medication 02/18/2021   Postoperative pain 02/18/2021   On enteral nutrition 02/18/2021  Postoperative anemia due to acute blood loss 02/18/2021   Altered mental status 02/18/2021   Dilated cardiomyopathy ([place]) 12/10/2020   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   Bradycardia 12/10/2020   Epididymo-orchitis 11/30/2020   Urinary retention 04/09/2020   Obesity (BMI 30.0-34.9) 05/14/2017   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   Renal cyst, acquired, left 12/22/2013   Neoplasm of uncertain behavior of penis 12/22/2013   Malignant neoplasm ([place]) 12/22/2013   History of urinary stone 12/22/2013   BPH (benign prostatic hyperplasia) 12/22/2013   Benign localized hyperplasia of prostate with urinary obstruction and other lower urinary tract symptoms (LUTS)(600.21) 12/22/2013   PCP:  Sherre Scarlet, PA-C Pharmacy:   [id number] [place] [address] [address]Phone: [phone]: [phone] [id number] [place] [address]: [phone]: [phone]   Social Determinants of Health (SDOH) Interventions    Readmission Risk Interventions    07/27/2021   12:31 PM  Readmission Risk Prevention Plan  Transportation Screening Complete  PCP or Specialist Appt within 3-5 Days Complete  Social Work Consult for ReGreerlanning/Counseling Complete  Palliative Care Screening Not Applicable  Medication Review (RPress photographerComplete

## 2021-09-16 NOTE — Progress Notes (Signed)
[facility] NOTE       Patient ID: Jermaine Kramer MRN: [id number] DOB/AGE: 1950-05-14 71 y.o.  Admit date: 09/15/2021 Referring Physician Dr. Fritzi Mandes Primary Physician Dr. Sena Hitch [place] PCP Primary Cardiologist Dr. Donnelly Angelica Reason for Consultation acute on chronic HFrEF  HPI: Jermaine Kramer is a 71 year old male with a past medical history of CAD (s/p recent CABG with LIMA to LAD, SVG to ramus and SVG to PDA on 02/16/2021 with Dr. Rosalita Chessman), postoperative atrial fibrillation (on amiodarone and Eliquis, dilated cardiomyopathy with LVEF 30-35%, mod MR, 07/22/2021 s/p dual chamber ICD 06/22/21, history of frequent PVCs, type 2 diabetes, history of TIA, history of penile cancer, hypertension who [facility] ED 09/15/2021 with an 8 pound weight gain over 3 days refractory to p.o. diuretics and worsening shortness of breath.  Cardiology is consulted for assistance with his heart failure.  Interval History: -UOP net neg 2L overnight.  -renal function improving with diuresis -main complaint is constipation, last BM was 3 days ago and was not well formed. I changed order to scheduled miralax. -abdomen a little softer but remains distended. No chest pain, SOB, palpitations.   Review of systems complete and found to be negative unless listed above     Past Medical History:  Diagnosis Date   Arrhythmia    atrial fibrillation   CHF (congestive heart failure) (HCC)    Coronary artery disease    Diabetes mellitus without complication (HCC)    GERD (gastroesophageal reflux disease)    Hypercholesteremia    Hypertension    Sleep apnea    Stroke [facility])     Past Surgical History:  Procedure Laterality Date   CHOLECYSTECTOMY     COLONOSCOPY WITH PROPOFOL N/A 04/14/2019   Procedure: COLONOSCOPY WITH PROPOFOL;  Surgeon: Toledo, Benay Pike, MD;  Location: [facility] ENDOSCOPY;  Service: Gastroenterology;  Laterality: N/A;   CORONARY ARTERY BYPASS GRAFT  02/16/2021   ICD  IMPLANT N/A 06/22/2021   Procedure: ICD IMPLANT;  Surgeon: Vickie Epley, MD;  Location: [facility] CV LAB;  Service: Cardiovascular;  Laterality: N/A;   KNEE ARTHROSCOPY     RENAL CYST EXCISION      Medications Prior to Admission  Medication Sig Dispense Refill Last Dose   amiodarone (PACERONE) 200 MG tablet Take 200 mg by mouth daily.   09/14/2021   amLODipine (NORVASC) 5 MG tablet Take 5 mg by mouth daily.   09/14/2021   amoxicillin (AMOXIL) 500 MG tablet Take 500 mg by mouth every 8 (eight) hours.   09/15/2021   apixaban (ELIQUIS) 5 MG TABS tablet Take 1 tablet (5 mg total) by mouth 2 (two) times daily. DO NOT RESTART UNTIL 06/27/2021. 60 tablet  09/14/2021 at 0800   aspirin EC 81 MG tablet Take 81 mg by mouth daily.   09/14/2021   atorvastatin (LIPITOR) 80 MG tablet Take 80 mg by mouth daily.   09/14/2021   furosemide (LASIX) 40 MG tablet Take 1 tablet (40 mg total) by mouth daily. (Patient taking differently: Take 20 mg by mouth daily.) 30 tablet 0 09/14/2021 at 0800   lisinopril (ZESTRIL) 2.5 MG tablet Take 1 tablet (2.5 mg total) by mouth daily. 30 tablet 5 09/14/2021   metFORMIN (GLUCOPHAGE) 1000 MG tablet Take 1,000 mg by mouth 2 (two) times daily with a meal.   09/14/2021   metoprolol succinate (TOPROL-XL) 25 MG 24 hr tablet Take 12.5 mg by mouth every evening.   09/14/2021   omeprazole (PRILOSEC) 40  MG capsule Take 40 mg by mouth daily.   09/14/2021   ondansetron (ZOFRAN) 4 MG tablet Take 4 mg by mouth every 8 (eight) hours as needed.   09/14/2021   tamsulosin (FLOMAX) 0.4 MG CAPS capsule Take 0.8 mg by mouth daily.   09/14/2021   glucose blood (PRECISION QID TEST) test strip Use 3 (three) times daily Use as instructed. One touch ultra       Social History   Socioeconomic History   Marital status: Married    Spouse name: Not on file   Number of children: Not on file   Years of education: Not on file   Highest education level: Not on file  Occupational History   Not on file   Tobacco Use   Smoking status: Never   Smokeless tobacco: Never  Vaping Use   Vaping Use: Never used  Substance and Sexual Activity   Alcohol use: Not Currently   Drug use: No   Sexual activity: Not on file  Other Topics Concern   Not on file  Social History Narrative   Not on file   Social Determinants of Health   Financial Resource Strain: Not on file  Food Insecurity: Not on file  Transportation Needs: Not on file  Physical Activity: Not on file  Stress: Not on file  Social Connections: Not on file  Intimate Partner Violence: Not on file    Family History  Problem Relation Age of Onset   Parkinson's disease Mother    Lupus Mother    Heart disease Father      PHYSICAL EXAM General: Pleasant elderly caucasian male, well nourished, in no acute distress.laying at incline in bed, wife in recliner at bedside.  HEENT:  Normocephalic and atraumatic. Neck:  No JVD.  Lungs: Normal respiratory effort on room air. Decreased breath sounds in bilateral bases. Heart: HRRR . Normal S1 and S2 without gallops or murmurs. Radial & DP pulses 2+ bilaterally. Abdomen: remains distended but slightly softer than yesterday, nontender to palpation.  Msk: Normal strength and tone for age. Extremities: Warm and well perfused. No clubbing, cyanosis. No peripheral edema.  Neuro: Alert and oriented X 3. Psych:  Answers questions appropriately.   Labs:   Lab Results  Component Value Date   WBC 6.2 09/15/2021   HGB 12.8 (L) 09/15/2021   HCT 39.6 09/15/2021   MCV 96.1 09/15/2021   PLT 84 (L) 09/15/2021    Recent Labs  Lab 09/15/21 0831 09/16/21 0557  NA 137 138  K 4.2 3.9  CL 105 103  CO2 23 25  BUN 30* 26*  CREATININE 1.72* 1.53*  CALCIUM 8.3* 8.4*  PROT 7.1  --   BILITOT 0.7  --   ALKPHOS 59  --   ALT 15  --   AST 18  --   GLUCOSE 94 115*    Lab Results  Component Value Date   TROPONINI < 0.02 10/19/2013    No results found for: CHOL No results found for: HDL No  results found for: LDLCALC No results found for: TRIG No results found for: CHOLHDL No results found for: LDLDIRECT    Radiology: DG Chest 2 View  Result Date: 09/15/2021 CLINICAL DATA:  A 71 year old male presents for about UA shin of shortness of breath. EXAM: CHEST - 2 VIEW COMPARISON:  July 24, 2021. FINDINGS: EKG leads project over the patient's chest. Post median sternotomy for CABG. Heart size is stable. Dual lead pacer defibrillator remains in place with power  pack over the LEFT chest. No sign of lobar consolidation. Subtle basilar airspace disease bilaterally. No gross pleural effusion. Subtle nodular density at the RIGHT costodiaphragmatic sulcus measuring 8 mm without correlate on recent chest CT. Mild increased interstitial markings.  No visible pneumothorax. On limited assessment no acute skeletal process. IMPRESSION: 1. Query mild edema. 2. Subtle basilar airspace disease bilaterally. Findings could represent atelectasis versus mild pneumonitis or infection 3. Subtle nodular density at the RIGHT costodiaphragmatic sulcus on the frontal projection is of uncertain significance potentially inflammatory or related to atelectatic changes. Suggest PA and lateral chest radiograph on follow-up to ensure resolution. No correlate on recent chest CT from July 24, 2021. Electronically Signed   By: Zetta Bills M.D.   On: 09/15/2021 09:06   DG Abdomen 1 View  Result Date: 09/15/2021 CLINICAL DATA:  Distension, dyspnea EXAM: ABDOMEN - 1 VIEW COMPARISON:  07/27/2021 abdominal radiograph FINDINGS: A few mildly dilated small bowel loops in the central abdomen up to 3.8 cm diameter. No evidence of pneumatosis or pneumoperitoneum. Mild colorectal stool volume. Cholecystectomy clips are seen in the right upper quadrant of the abdomen. No radiopaque nephrolithiasis. IMPRESSION: A few mildly dilated small bowel loops in the central abdomen, cannot exclude mild/early mid to distal small bowel obstruction.  Further evaluation with CT abdomen/pelvis with oral and IV contrast may be obtained as clinically warranted. Electronically Signed   By: Ilona Sorrel M.D.   On: 09/15/2021 09:05    ECHO 07/25/2021 DOPPLER ECHO and OTHER SPECIAL PROCEDURES                 Aortic: No AR                      No AS                         136.0 cm/sec peak vel      7.4 mmHg peak grad                         4.0 mmHg mean grad         2.0 cm^2 by DOPPLER                 Mitral: MODERATE MR                No MS                         392.0 cm/sec peak vel      61.5 mmHg peak grad                         41.0 mmHg mean grad                         MV Inflow E Vel = 106.0 cm/sec      MV Annulus E'Vel = 4.0 cm/sec                         E/E'Ratio = 26.5              Tricuspid: MILD TR                    No TS  196.0 cm/sec peak TR vel              Pulmonary: No PR                      No PS                         83.4 cm/sec peak vel       2.8 mmHg peak grad  _________________________________________________________________________________________  INTERPRETATION  MODERATE LV SYSTOLIC DYSFUNCTION (See above)   WITH MILD LVH  MODERATE RV SYSTOLIC DYSFUNCTION (See above)  MODERATE VALVULAR REGURGITATION (See above)  NO VALVULAR STENOSIS  ESTIMATED LVEF 30-35% (CALCULATED: 32.9%)  GLS: -4.3%  Mitral: MODERATE MR  MILD LAE  MILDLY DILATED ASCENDING AORTA MEASURING 3.5cm   TELEMETRY reviewed by me: sinus rhythm rate 70s  EKG reviewed by me: sinus rhythm RBBB rate 77 similar to prior  ASSESSMENT AND PLAN:  Dejaun Vidrio is a 71 year old male with a past medical history of CAD (s/p recent CABG with LIMA to LAD, SVG to ramus and SVG to PDA on 02/16/2021 with Dr. Rosalita Chessman), postoperative atrial fibrillation (on amiodarone and Eliquis, dilated cardiomyopathy with LVEF 30-35%, mod MR, 07/22/2021 s/p dual chamber ICD 06/22/21, history of frequent PVCs, type 2 diabetes, history of TIA, history of  penile cancer, hypertension who presented to [facility] ED 09/15/2021 with an 8 pound weight gain over 3 days refractory to p.o. diuretics and worsening shortness of breath.  Cardiology is consulted for assistance with his heart failure.  #acute on chronic HFrEF (LVEF 30-35%, mod MR 06/2021) The patient presents with a three day history of shortness of breath and an 8 pound weight gain refractory to taking an extra lasix '20mg'$ . He has been naueated with significant abdominal swelling/bloating over the past three days (where he holds all of his fluids) and his abdomen is very distended and tight on exam. BNP is elevated to 900 compared to 686 three weeks ago. Trying to avoid salt and limit his fluid intake. His abdominal symptoms and nausea are likely related to volume overload.  -s/p IV lasix '80mg'$  x1, '40mg'$  IV x1 , increase to '60mg'$  IV BID starting this evening -daily BMP -hold lisinopril 2.'5mg'$  with diuresis, restart as renal function improves -continue metoprolol XL 12.5, only tolerates low doses of GDMT due to hypotention -strict I/O, daily weights. Dry weight is 185lbs (193.6 on admission)  -echocardiogram complete -will continue to follow closely   #constipation -ordered scheduled miralax daily and dulcolax for 3 doses.   #AKI on CKD Renal function on admission with Cr. 1.72 and GFR 41 (baseline from 08/16/2021 Cr 1.34 and GFR 56) -improving with diuresis   #CAD s/p CABG 01/2021 -continue atorvastatin and aspirin with close monitoring of his platelets  #postoperative atrial fibrillation (s/p CABG) Remains in sinus rhythm, continue amiodarone '200mg'$  once daily and eliquis '5mg'$  BID for stroke risk reduction  This patient's plan of care was discussed and created with Dr. Clayborn Bigness and he is in agreement.  Signed: Tristan Schroeder , PA-C 09/16/2021, 9:23 AM [facility]

## 2021-09-16 NOTE — Progress Notes (Signed)
Jermaine Kramer at [facility] NAME: Jermaine Kramer    MR#:  [id number]  DATE OF BIRTH:  [birth year]  SUBJECTIVE:  patient feels bit better today. Breathing stable. Wife at bedside. Complains of tooth ache. Getting antibiotic through dentist. Requesting pain medication. Good urine output   VITALS:  Blood pressure 91/62, pulse 76, temperature 98.4 F (36.9 C), temperature source Oral, resp. rate 18, height '5\' 7"'$  (1.702 m), weight 87.7 kg, SpO2 95 %.  PHYSICAL EXAMINATION:   GENERAL:  71 y.o.-year-old patient lying in the bed with no acute distress. Poor dentition LUNGS: Normal breath sounds bilaterally, no wheezing, rales, rhonchi.  CARDIOVASCULAR: S1, S2 normal. No murmurs, rubs, or gallops.  ABDOMEN: Soft, nontender, nondistended. Bowel sounds present.  EXTREMITIES: bilateral lower extremity edema improving NEUROLOGIC: nonfocal  patient is alert and awake SKIN: No obvious rash, lesion, or ulcer.   LABORATORY PANEL:  CBC Recent Labs  Lab 09/15/21 0831  WBC 6.2  HGB 12.8*  HCT 39.6  PLT 84*    Chemistries  Recent Labs  Lab 09/15/21 0831 09/16/21 0557  NA 137 138  K 4.2 3.9  CL 105 103  CO2 23 25  GLUCOSE 94 115*  BUN 30* 26*  CREATININE 1.72* 1.53*  CALCIUM 8.3* 8.4*  AST 18  --   ALT 15  --   ALKPHOS 59  --   BILITOT 0.7  --    Cardiac Enzymes No results for input(s): TROPONINI in the last 168 hours. RADIOLOGY:  DG Chest 2 View  Result Date: 09/15/2021 CLINICAL DATA:  A 71 year old male presents for about UA shin of shortness of breath. EXAM: CHEST - 2 VIEW COMPARISON:  July 24, 2021. FINDINGS: EKG leads project over the patient's chest. Post median sternotomy for CABG. Heart size is stable. Dual lead pacer defibrillator remains in place with power pack over the LEFT chest. No sign of lobar consolidation. Subtle basilar airspace disease bilaterally. No gross pleural effusion. Subtle nodular density at the RIGHT  costodiaphragmatic sulcus measuring 8 mm without correlate on recent chest CT. Mild increased interstitial markings.  No visible pneumothorax. On limited assessment no acute skeletal process. IMPRESSION: 1. Query mild edema. 2. Subtle basilar airspace disease bilaterally. Findings could represent atelectasis versus mild pneumonitis or infection 3. Subtle nodular density at the RIGHT costodiaphragmatic sulcus on the frontal projection is of uncertain significance potentially inflammatory or related to atelectatic changes. Suggest PA and lateral chest radiograph on follow-up to ensure resolution. No correlate on recent chest CT from July 24, 2021. Electronically Signed   By: Zetta Bills M.D.   On: 09/15/2021 09:06   DG Abdomen 1 View  Result Date: 09/15/2021 CLINICAL DATA:  Distension, dyspnea EXAM: ABDOMEN - 1 VIEW COMPARISON:  07/27/2021 abdominal radiograph FINDINGS: A few mildly dilated small bowel loops in the central abdomen up to 3.8 cm diameter. No evidence of pneumatosis or pneumoperitoneum. Mild colorectal stool volume. Cholecystectomy clips are seen in the right upper quadrant of the abdomen. No radiopaque nephrolithiasis. IMPRESSION: A few mildly dilated small bowel loops in the central abdomen, cannot exclude mild/early mid to distal small bowel obstruction. Further evaluation with CT abdomen/pelvis with oral and IV contrast may be obtained as clinically warranted. Electronically Signed   By: Ilona Sorrel M.D.   On: 09/15/2021 09:05    Assessment and Plan Jermaine Kramer is a 71 y.o. male with medical history significant of systolic congestive heart failure with EF of 30%, moderate MR,  history of CAD with recent CABG in October 2022, postop atrial fibrillation, status post dual chamber ICD placement in March 2023, history of frequent PVCs, type II diabetes, hypertension comes to the emergency room today with 8 pound weight gain over three days refractory to PO diuretics and worsening shortness of  breath   acute on chronic systolic congestive heart failure with history of dilated cardiomyopathy EF 30 to 35% status post dual chamber ICD placement -- came in with shortness of breath, exertional hypoxia sats in the 80s, elevated BNP and 8 pound weight gain over 3 to 4 days -- IV Lasix 80 mg in the ER--- will continue 40 mg BID--increased to IV 60 mg bid by cardiology -- continue lisinopril, metoprolol -- monitor input output -- cardiology consultation with Dr. Clayborn Bigness noted --UOP >3500 cc   type II diabetes with hyperlipidemia -- continue sliding scale insulin -- recent A1c 7.8-- continue statins   chronic a fib -- on metoprolol and eliquis -- continue amiodarone   history of CAD status post CABG October 2022 -- continue statins and aspirin  Dental caries/infection -- resume amoxicillin and prn pain meds     Procedures: Family communication : wife at bedside Consults : [facility] cardiology CODE STATUS: full DVT Prophylaxis : eliquis Level of care: Telemetry Cardiac Status is: Inpatient Remains inpatient appropriate because: CHF with ongoing diuresis with IV Lasix    TOTAL TIME TAKING CARE OF THIS PATIENT: 35 minutes.  >50% time spent on counselling and coordination of care  Note: This dictation was prepared with Dragon dictation along with smaller phrase technology. Any transcriptional errors that result from this process are unintentional.  Fritzi Mandes M.D    [facility]   CC: Primary care physician; Jermaine Scarlet, PA-C

## 2021-09-16 NOTE — Progress Notes (Signed)
*  PRELIMINARY RESULTS* Echocardiogram 2D Echocardiogram has been performed.  Jermaine Kramer 09/16/2021, 10:10 AM

## 2021-09-16 NOTE — Plan of Care (Signed)

## 2021-09-16 NOTE — TOC Initial Note (Signed)
Transition of Care [facility]) - Initial/Assessment Note    Patient Details  Name: Jermaine Kramer MRN: [id number] Date of Birth: Sep 14, 1950  Transition of Care [facility]) CM/SW Contact:    Laurena Slimmer, RN Phone Number: 09/16/2021, 10:19 AM  Clinical Narrative:                 [facility] consulted for heart failure screen. Heart failure RN Delmar Landau has been informed.          Patient Goals and CMS Choice        Expected Discharge Plan and Services                                                Prior Living Arrangements/Services                       Activities of Daily Living Home Assistive Devices/Equipment: Eyeglasses ADL Screening (condition at time of admission) Patient's cognitive ability adequate to safely complete daily activities?: Yes Is the patient deaf or have difficulty hearing?: No Does the patient have difficulty seeing, even when wearing glasses/contacts?: No Does the patient have difficulty concentrating, remembering, or making decisions?: No Patient able to express need for assistance with ADLs?: Yes Does the patient have difficulty dressing or bathing?: No Independently performs ADLs?: Yes (appropriate for developmental age) Does the patient have difficulty walking or climbing stairs?: No Weakness of Legs: None Weakness of Arms/Hands: None  Permission Sought/Granted                  Emotional Assessment              Admission diagnosis:  Acute pulmonary edema ([place]) [J81.0] Acute CHF (congestive heart failure) ([place]) [I50.9] Acute on chronic congestive heart failure, unspecified heart failure type [facility]) [I50.9] Patient Active Problem List   Diagnosis Date Noted   Acute pulmonary edema ([place])    Left flank pain 07/24/2021   Stroke ([place]) 07/24/2021   Chronic combined systolic and diastolic congestive heart failure ([place]) 07/24/2021   Atrial fibrillation, chronic ([place]) 07/24/2021   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   Kidney  stone on left side 07/24/2021   HFrEF (heart failure with reduced ejection fraction) ([place]) 06/22/2021   Ischemic cardiomyopathy 06/22/2021   S/P CABG x 3 06/22/2021   Hyperlipidemia LDL goal <70 06/22/2021   Acute CHF (congestive heart failure) ([place]) 05/14/2021   Incomplete emptying of bladder 05/04/2021   Acute respiratory failure with hypoxia (HCC)    Troponin I above reference range    COVID 04/21/2021   Postoperative atrial fibrillation ([place]) 04/21/2021   DM (diabetes mellitus), type 2 with coma ([place])    GERD (gastroesophageal reflux disease)    Hypertension    Acute parotitis 02/27/2021   Volume overload 02/19/2021   Thrombocytopenia ([place]) 02/18/2021   Receiving inotropic medication 02/18/2021   Postoperative pain 02/18/2021   On enteral nutrition 02/18/2021   Postoperative anemia due to acute blood loss 02/18/2021   Altered mental status 02/18/2021   Dilated cardiomyopathy ([place]) 12/10/2020   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   Bradycardia 12/10/2020   Epididymo-orchitis 11/30/2020   Urinary retention 04/09/2020   Obesity (BMI 30.0-34.9) 05/14/2017   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   Renal cyst, acquired, left 12/22/2013   Neoplasm of uncertain behavior of penis 12/22/2013   Malignant neoplasm ([place])  12/22/2013   History of urinary stone 12/22/2013   BPH (benign prostatic hyperplasia) 12/22/2013   Benign localized hyperplasia of prostate with urinary obstruction and other lower urinary tract symptoms (LUTS)(600.21) 12/22/2013   PCP:  Sherre Scarlet, PA-C Pharmacy:   [id number] [place] [address] [address]Phone: [phone]: [phone] [id number] [address] [place] [address]: [phone]: [phone]   Social Determinants of Health (SDOH) Interventions    Readmission Risk Interventions    07/27/2021   12:31 PM  Readmission  Risk Prevention Plan  Transportation Screening Complete  PCP or Specialist Appt within 3-5 Days Complete  Social Work Consult for [place]/Counseling Complete  Palliative Care Screening Not Applicable  Medication Review (RPress photographerComplete

## 2021-09-17 DIAGNOSIS — I5021 Acute systolic (congestive) heart failure: Secondary | ICD-10-CM | POA: Diagnosis not present

## 2021-09-17 LAB — BASIC METABOLIC PANEL
Anion gap: 8 (ref 5–15)
BUN: 21 mg/dL (ref 8–23)
CO2: 29 mmol/L (ref 22–32)
Calcium: 8.2 mg/dL — ABNORMAL LOW (ref 8.9–10.3)
Chloride: 101 mmol/L (ref 98–111)
Creatinine, Ser: 1.53 mg/dL — ABNORMAL HIGH (ref 0.61–1.24)
GFR, Estimated: 48 mL/min — ABNORMAL LOW (ref >60)
Glucose, Bld: 119 mg/dL — ABNORMAL HIGH (ref 70–99)
Potassium: 3.5 mmol/L (ref 3.5–5.1)
Sodium: 138 mmol/L (ref 135–145)

## 2021-09-17 MED ORDER — LACTULOSE 10 GM/15ML PO SOLN: 20.0000 g | Freq: Two times a day (BID) | ORAL | 0 refills | Status: DC

## 2021-09-17 MED ORDER — FUROSEMIDE 40 MG PO TABS
40.0000 mg | ORAL_TABLET | Freq: Every day | ORAL | Status: DC
  Administered 2021-09-17: 40 mg via ORAL
  Filled 2021-09-17: qty 1

## 2021-09-17 MED ORDER — FUROSEMIDE 40 MG PO TABS: 40.0000 mg | ORAL_TABLET | Freq: Every day | ORAL | 1 refills | Status: DC

## 2021-09-17 MED ORDER — POLYETHYLENE GLYCOL 3350 17 G PO PACK
17.0000 g | PACK | Freq: Every day | ORAL | 0 refills | Status: DC
Start: 2021-09-17 — End: 2021-10-07

## 2021-09-17 NOTE — Progress Notes (Signed)
[facility] Cardiology    SUBJECTIVE: Patient states to feel much better improved denies shortness of breath feels almost back to normal has ambulated in the hall complains of mild constipation otherwise noted to be able   Vitals:   09/16/21 1949 09/17/21 0036 09/17/21 0401 09/17/21 0444  BP: 102/67 101/67 99/62   Pulse: 77 69 70   Resp: '20 16 15   '$ Temp: 97.7 F (36.5 C) 98.1 F (36.7 C) 98.1 F (36.7 C)   TempSrc:  Oral    SpO2: 95% 96% 92%   Weight:    86.1 kg  Height:         Intake/Output Summary (Last 24 hours) at 09/17/2021 9323 Last data filed at 09/17/2021 5573 Gross per 24 hour  Intake 960 ml  Output 3650 ml  Net -2690 ml      PHYSICAL EXAM  General: Well developed, well nourished, in no acute distress HEENT:  Normocephalic and atramatic Neck:  No JVD.  Lungs: Clear bilaterally to auscultation and percussion. Heart: HRRR . Normal S1 and S2 without gallops or murmurs.  Abdomen: Bowel sounds are positive, abdomen soft and non-tender  Msk:  Back normal, normal gait. Normal strength and tone for age. Extremities: No clubbing, cyanosis or edema.   Neuro: Alert and oriented X 3. Psych:  Good affect, responds appropriately   LABS: Basic Metabolic Panel: Recent Labs    09/16/21 0557 09/17/21 0508  NA 138 138  K 3.9 3.5  CL 103 101  CO2 25 29  GLUCOSE 115* 119*  BUN 26* 21  CREATININE 1.53* 1.53*  CALCIUM 8.4* 8.2*   Liver Function Tests: Recent Labs    09/15/21 0831  AST 18  ALT 15  ALKPHOS 59  BILITOT 0.7  PROT 7.1  ALBUMIN 3.8   No results for input(s): LIPASE, AMYLASE in the last 72 hours. CBC: Recent Labs    09/15/21 0831  WBC 6.2  HGB 12.8*  HCT 39.6  MCV 96.1  PLT 84*   Cardiac Enzymes: No results for input(s): CKTOTAL, CKMB, CKMBINDEX, TROPONINI in the last 72 hours. BNP: Invalid input(s): POCBNP D-Dimer: No results for input(s): DDIMER in the last 72 hours. Hemoglobin A1C: No results for input(s): HGBA1C in the last 72  hours. Fasting Lipid Panel: No results for input(s): CHOL, HDL, LDLCALC, TRIG, CHOLHDL, LDLDIRECT in the last 72 hours. Thyroid Function Tests: No results for input(s): TSH, T4TOTAL, T3FREE, THYROIDAB in the last 72 hours.  Invalid input(s): FREET3 Anemia Panel: No results for input(s): VITAMINB12, FOLATE, FERRITIN, TIBC, IRON, RETICCTPCT in the last 72 hours.  DG Chest 2 View  Result Date: 09/15/2021 CLINICAL DATA:  A 71 year old male presents for about UA shin of shortness of breath. EXAM: CHEST - 2 VIEW COMPARISON:  July 24, 2021. FINDINGS: EKG leads project over the patient's chest. Post median sternotomy for CABG. Heart size is stable. Dual lead pacer defibrillator remains in place with power pack over the LEFT chest. No sign of lobar consolidation. Subtle basilar airspace disease bilaterally. No gross pleural effusion. Subtle nodular density at the RIGHT costodiaphragmatic sulcus measuring 8 mm without correlate on recent chest CT. Mild increased interstitial markings.  No visible pneumothorax. On limited assessment no acute skeletal process. IMPRESSION: 1. Query mild edema. 2. Subtle basilar airspace disease bilaterally. Findings could represent atelectasis versus mild pneumonitis or infection 3. Subtle nodular density at the RIGHT costodiaphragmatic sulcus on the frontal projection is of uncertain significance potentially inflammatory or related to atelectatic changes. Suggest PA and  lateral chest radiograph on follow-up to ensure resolution. No correlate on recent chest CT from July 24, 2021. Electronically Signed   By: Zetta Bills M.D.   On: 09/15/2021 09:06   DG Abdomen 1 View  Result Date: 09/15/2021 CLINICAL DATA:  Distension, dyspnea EXAM: ABDOMEN - 1 VIEW COMPARISON:  07/27/2021 abdominal radiograph FINDINGS: A few mildly dilated small bowel loops in the central abdomen up to 3.8 cm diameter. No evidence of pneumatosis or pneumoperitoneum. Mild colorectal stool volume.  Cholecystectomy clips are seen in the right upper quadrant of the abdomen. No radiopaque nephrolithiasis. IMPRESSION: A few mildly dilated small bowel loops in the central abdomen, cannot exclude mild/early mid to distal small bowel obstruction. Further evaluation with CT abdomen/pelvis with oral and IV contrast may be obtained as clinically warranted. Electronically Signed   By: Ilona Sorrel M.D.   On: 09/15/2021 09:05   ECHOCARDIOGRAM COMPLETE  Result Date: 09/16/2021    ECHOCARDIOGRAM REPORT   Patient Name:   Jermaine Kramer Date of Exam: 09/16/2021 Medical Rec #:  [id number]     Height:       67.0 in Accession #:    [id number]    Weight:       193.3 lb Date of Birth:  1951/04/25      BSA:          1.993 m Patient Age:    71 years      BP:           99/71 mmHg Patient Gender: M             HR:           70 bpm. Exam Location:  [facility] Procedure: 2D Echo, Cardiac Doppler and Color Doppler Indications:     CHF-acute systolic W23.76  History:         Patient has prior history of Echocardiogram examinations, most                  recent 05/16/2021. CHF, CAD; Risk Factors:Diabetes and                  Hypertension.  Sonographer:     Sherrie Sport Referring Phys:  [id number] [place] TANG Diagnosing Phys: Yolonda Kida MD  Sonographer Comments: Suboptimal apical window. IMPRESSIONS  1. Left ventricular ejection fraction, by estimation, is 30 to 35%. The left ventricle has moderately decreased function. The left ventricle demonstrates global hypokinesis. The left ventricular internal cavity size was severely dilated. Left ventricular diastolic parameters are consistent with Grade III diastolic dysfunction (restrictive).  2. Right ventricular systolic function is low normal. The right ventricular size is normal. Mildly increased right ventricular wall thickness.  3. Left atrial size was moderately dilated.  4. Right atrial size was moderately dilated.  5. The mitral valve is normal in structure. Mild mitral valve  regurgitation.  6. The tricuspid valve is abnormal.  7. The aortic valve is normal in structure. There is mild calcification of the aortic valve. There is mild thickening of the aortic valve. Aortic valve regurgitation is not visualized. Aortic valve sclerosis is present, with no evidence of aortic valve stenosis. FINDINGS  Left Ventricle: Left ventricular ejection fraction, by estimation, is 30 to 35%. The left ventricle has moderately decreased function. The left ventricle demonstrates global hypokinesis. The left ventricular internal cavity size was severely dilated. There is borderline left ventricular hypertrophy. Left ventricular diastolic parameters are consistent with Grade III diastolic dysfunction (restrictive). Right Ventricle: The  right ventricular size is normal. Mildly increased right ventricular wall thickness. Right ventricular systolic function is low normal. Left Atrium: Left atrial size was moderately dilated. Right Atrium: Right atrial size was moderately dilated. Pericardium: Trivial pericardial effusion is present. Mitral Valve: The mitral valve is normal in structure. Mild mitral valve regurgitation. MV peak gradient, 4.2 mmHg. The mean mitral valve gradient is 2.0 mmHg. Tricuspid Valve: The tricuspid valve is abnormal. Tricuspid valve regurgitation is mild. Aortic Valve: The aortic valve is normal in structure. There is mild calcification of the aortic valve. There is mild thickening of the aortic valve. Aortic valve regurgitation is not visualized. Aortic valve sclerosis is present, with no evidence of aortic valve stenosis. Aortic valve mean gradient measures 7.0 mmHg. Aortic valve peak gradient measures 12.3 mmHg. Aortic valve area, by VTI measures 1.07 cm. Pulmonic Valve: The pulmonic valve was normal in structure. Pulmonic valve regurgitation is not visualized. Aorta: The ascending aorta was not well visualized. IAS/Shunts: No atrial level shunt detected by color flow Doppler.  LEFT  VENTRICLE PLAX 2D LVIDd:         6.10 cm      Diastology LVIDs:         5.30 cm      LV e' medial:    3.92 cm/s LV PW:         1.40 cm      LV E/e' medial:  29.8 LV IVS:        0.95 cm      LV e' lateral:   11.60 cm/s LVOT diam:     2.00 cm      LV E/e' lateral: 10.1 LV SV:         32 LV SV Index:   16 LVOT Area:     3.14 cm  LV Volumes (MOD) LV vol d, MOD A2C: 232.0 ml LV vol d, MOD A4C: 174.0 ml LV vol s, MOD A2C: 158.0 ml LV vol s, MOD A4C: 115.0 ml LV SV MOD A2C:     74.0 ml LV SV MOD A4C:     174.0 ml LV SV MOD BP:      68.6 ml RIGHT VENTRICLE RV Basal diam:  5.10 cm RV S prime:     8.70 cm/s TAPSE (M-mode): 2.1 cm LEFT ATRIUM             Index        RIGHT ATRIUM           Index LA diam:        5.00 cm 2.51 cm/m   RA Area:     20.70 cm LA Vol (A2C):   93.0 ml 46.65 ml/m  RA Volume:   68.20 ml  34.21 ml/m LA Vol (A4C):   66.9 ml 33.56 ml/m LA Biplane Vol: 82.8 ml 41.54 ml/m  AORTIC VALVE                     PULMONIC VALVE AV Area (Vmax):    1.12 cm      PV Vmax:        0.65 m/s AV Area (Vmean):   1.03 cm      PV Vmean:       40.500 cm/s AV Area (VTI):     1.07 cm      PV VTI:         0.111 m AV Vmax:           175.67 cm/s  PV Peak grad:   1.7 mmHg AV Vmean:          119.800 cm/s  PV Mean grad:   1.0 mmHg AV VTI:            0.303 m       RVOT Peak grad: 3 mmHg AV Peak Grad:      12.3 mmHg AV Mean Grad:      7.0 mmHg LVOT Vmax:         62.70 cm/s LVOT Vmean:        39.300 cm/s LVOT VTI:          0.103 m LVOT/AV VTI ratio: 0.34  AORTA Ao Root diam: 3.70 cm MITRAL VALVE                TRICUSPID VALVE MV Area (PHT): 4.99 cm     TR Peak grad:   19.2 mmHg MV Area VTI:   1.18 cm     TR Vmax:        219.00 cm/s MV Peak grad:  4.2 mmHg MV Mean grad:  2.0 mmHg     SHUNTS MV Vmax:       1.03 m/s     Systemic VTI:  0.10 m MV Vmean:      70.0 cm/s    Systemic Diam: 2.00 cm MV Decel Time: 152 msec     Pulmonic VTI:  0.105 m MV E velocity: 117.00 cm/s MV A velocity: 51.80 cm/s MV E/A ratio:  2.26 Graceson Nichelson D Saisha Hogue  MD Electronically signed by Yolonda Kida MD Signature Date/Time: 09/16/2021/11:17:59 PM    Final      Echo moderately reduced left ventricular function ejection fraction of 30 and 35%  TELEMETRY: Normal sinus rhythm rate of 75 bundle branch block:  ASSESSMENT AND PLAN:  Principal Problem:   Acute CHF (congestive heart failure) (HCC) Active Problems:   DM (diabetes mellitus), type 2 with coma (HCC)   Dental caries Hypotension Acute on chronic renal insufficiency Dehydration Ischemic cardiomyopathy Multivessel coronary disease History of atrial fibrillation on anticoagulation  Plan Acute on chronic congestive heart failure continue diuresis switch from IV to p.o. increase activity consider early discharge follow-up with cardiology as an outpatient Cardiomyopathy moderate ejection fraction between 30 to 35% elevated BNP recommend diuretics as necessary resume heart failure therapy consider imdur hydralazine because of renal insufficiency Acute on chronic renal insufficiency probably related to dehydration heart failure and nephrotoxic drugs like ACE inhibitor we will hold lisinopril for now switch to Imdur hydralazine for heart failure therapy Atrial fibrillation paroxysmal on amiodarone Eliquis reasonably stable continue medical therapy Episode of hypotension improved after mild hydration continue current therapy   Yolonda Kida, MD 09/17/2021 7:04 AM

## 2021-09-17 NOTE — Discharge Summary (Signed)
Physician Discharge Summary   Patient: Jermaine Kramer MRN: [id number] DOB: December 21, 1950  Admit date:     09/15/2021  Discharge date: 09/17/21  Discharge Physician: Fritzi Mandes   PCP: Sherre Scarlet, PA-C   Recommendations at discharge:    F/u Dr Corky Sox in 1 week  Discharge Diagnoses: Acute on chronic CHF,systolic  Hospital Course:  Jermaine Kramer is a 71 y.o. male with medical history significant of systolic congestive heart failure with EF of 30%, moderate MR, history of CAD with recent CABG in October 2022, postop atrial fibrillation, status post dual chamber ICD placement in March 2023, history of frequent PVCs, type II diabetes, hypertension comes to the emergency room today with 8 pound weight gain over three days refractory to PO diuretics and worsening shortness of breath   acute on chronic systolic congestive heart failure with history of dilated cardiomyopathy EF 30 to 35% status post dual chamber ICD placement -- came in with shortness of breath, exertional hypoxia sats in the 80s, elevated BNP and 8 pound weight gain over 3 to 4 days -- IV Lasix 80 mg in the ER--- will continue 40 mg BID--increased to IV 60 mg bid by cardiology--will change to lasix 40 mg daily (pt was taking 20 mg at home) -- continue metoprolol -- cardiology consultation with Dr. Clayborn Bigness noted--recommends to hold off ACE due to renal function.Trial of Imdur+hydralazine as  out pt--defer to dr Corky Sox --UOP >3500 cc   type II diabetes with hyperlipidemia -- continue sliding scale insulin -- recent A1c 7.8-- continue statins   chronic a fib -- on metoprolol and eliquis -- continue amiodarone   history of CAD status post CABG October 2022 -- continue statins and aspirin   Dental caries/infection -- resume amoxicillin and prn pain meds   Overall improving and stable       Consultants: [facility] cardiology Procedures performed: none  Disposition: Home Diet recommendation:  Discharge Diet Orders (From  admission, onward)     Start     Ordered   09/17/21 0000  Diet - low sodium heart healthy        09/17/21 0814           Cardiac and Carb modified diet DISCHARGE MEDICATION: Allergies as of 09/17/2021       Reactions   Doxycycline Anaphylaxis   Patient does not recall having this reaction.    Heparin Other (See Comments)   heparin-induced thrombocytopenia   Empagliflozin Itching   Groin infection Caused a groin infection.         Medication List     STOP taking these medications    amLODipine 5 MG tablet Commonly known as: NORVASC   lisinopril 2.5 MG tablet Commonly known as: ZESTRIL       TAKE these medications    amiodarone 200 MG tablet Commonly known as: PACERONE Take 200 mg by mouth daily.   amoxicillin 500 MG tablet Commonly known as: AMOXIL Take 500 mg by mouth every 8 (eight) hours.   apixaban 5 MG Tabs tablet Commonly known as: ELIQUIS Take 1 tablet (5 mg total) by mouth 2 (two) times daily. DO NOT RESTART UNTIL 06/27/2021.   aspirin EC 81 MG tablet Take 81 mg by mouth daily.   atorvastatin 80 MG tablet Commonly known as: LIPITOR Take 80 mg by mouth daily.   furosemide 40 MG tablet Commonly known as: LASIX Take 1 tablet (40 mg total) by mouth daily. What changed: how much to take   lactulose  10 GM/15ML solution Commonly known as: CHRONULAC Take 30 mLs (20 g total) by mouth 2 (two) times daily.   metFORMIN 1000 MG tablet Commonly known as: GLUCOPHAGE Take 1,000 mg by mouth 2 (two) times daily with a meal.   metoprolol succinate 25 MG 24 hr tablet Commonly known as: TOPROL-XL Take 12.5 mg by mouth every evening.   omeprazole 40 MG capsule Commonly known as: PRILOSEC Take 40 mg by mouth daily.   ondansetron 4 MG tablet Commonly known as: ZOFRAN Take 4 mg by mouth every 8 (eight) hours as needed.   polyethylene glycol 17 g packet Commonly known as: MIRALAX / GLYCOLAX Take 17 g by mouth daily.   Precision QID Test test  strip Generic drug: glucose blood Use 3 (three) times daily Use as instructed. One touch ultra   tamsulosin 0.4 MG Caps capsule Commonly known as: FLOMAX Take 0.8 mg by mouth daily.        Follow-up Information     Andrez Grime, MD. Go in 1 week(s).   Specialty: Cardiology Contact information: [place] [address] [phone]         Sherre Scarlet, [place]. Call in 1 week(s).   Specialty: Physician Assistant Why: hospital f/u Contact information: [place] [address] [phone]         Vickie Epley, MD .   Specialties: Cardiology, Radiology Contact information: [place] [address] [phone]                Discharge Exam: Jermaine Kramer Weights   09/15/21 0804 09/16/21 0551 09/17/21 0444  Weight: 88 kg 87.7 kg 86.1 kg     Condition at discharge: fair  The results of significant diagnostics from this hospitalization (including imaging, microbiology, ancillary and laboratory) are listed below for reference.   Imaging Studies: DG Chest 2 View  Result Date: 09/15/2021 CLINICAL DATA:  A 71 year old male presents for about UA shin of shortness of breath. EXAM: CHEST - 2 VIEW COMPARISON:  July 24, 2021. FINDINGS: EKG leads project over the patient's chest. Post median sternotomy for CABG. Heart size is stable. Dual lead pacer defibrillator remains in place with power pack over the LEFT chest. No sign of lobar consolidation. Subtle basilar airspace disease bilaterally. No gross pleural effusion. Subtle nodular density at the RIGHT costodiaphragmatic sulcus measuring 8 mm without correlate on recent chest CT. Mild increased interstitial markings.  No visible pneumothorax. On limited assessment no acute skeletal process. IMPRESSION: 1. Query mild edema. 2. Subtle basilar airspace disease bilaterally. Findings could represent atelectasis versus mild pneumonitis or infection 3. Subtle  nodular density at the RIGHT costodiaphragmatic sulcus on the frontal projection is of uncertain significance potentially inflammatory or related to atelectatic changes. Suggest PA and lateral chest radiograph on follow-up to ensure resolution. No correlate on recent chest CT from July 24, 2021. Electronically Signed   By: Zetta Bills M.D.   On: 09/15/2021 09:06   DG Abdomen 1 View  Result Date: 09/15/2021 CLINICAL DATA:  Distension, dyspnea EXAM: ABDOMEN - 1 VIEW COMPARISON:  07/27/2021 abdominal radiograph FINDINGS: A few mildly dilated small bowel loops in the central abdomen up to 3.8 cm diameter. No evidence of pneumatosis or pneumoperitoneum. Mild colorectal stool volume. Cholecystectomy clips are seen in the right upper quadrant of the abdomen. No radiopaque nephrolithiasis. IMPRESSION: A few mildly dilated small bowel loops in the central abdomen, cannot exclude mild/early mid to distal small bowel  obstruction. Further evaluation with CT abdomen/pelvis with oral and IV contrast may be obtained as clinically warranted. Electronically Signed   By: Ilona Sorrel M.D.   On: 09/15/2021 09:05   ECHOCARDIOGRAM COMPLETE  Result Date: 09/16/2021    ECHOCARDIOGRAM REPORT   Patient Name:   Jermaine Kramer Date of Exam: 09/16/2021 Medical Rec #:  [id number]     Height:       67.0 in Accession #:    [id number]    Weight:       193.3 lb Date of Birth:  Mar 31, 1951      BSA:          1.993 m Patient Age:    73 years      BP:           99/71 mmHg Patient Gender: M             HR:           70 bpm. Exam Location:  [facility] Procedure: 2D Echo, Cardiac Doppler and Color Doppler Indications:     CHF-acute systolic D40.81  History:         Patient has prior history of Echocardiogram examinations, most                  recent 05/16/2021. CHF, CAD; Risk Factors:Diabetes and                  Hypertension.  Sonographer:     Sherrie Sport Referring Phys:  [id number] [place] TANG Diagnosing Phys: Yolonda Kida MD  Sonographer  Comments: Suboptimal apical window. IMPRESSIONS  1. Left ventricular ejection fraction, by estimation, is 30 to 35%. The left ventricle has moderately decreased function. The left ventricle demonstrates global hypokinesis. The left ventricular internal cavity size was severely dilated. Left ventricular diastolic parameters are consistent with Grade III diastolic dysfunction (restrictive).  2. Right ventricular systolic function is low normal. The right ventricular size is normal. Mildly increased right ventricular wall thickness.  3. Left atrial size was moderately dilated.  4. Right atrial size was moderately dilated.  5. The mitral valve is normal in structure. Mild mitral valve regurgitation.  6. The tricuspid valve is abnormal.  7. The aortic valve is normal in structure. There is mild calcification of the aortic valve. There is mild thickening of the aortic valve. Aortic valve regurgitation is not visualized. Aortic valve sclerosis is present, with no evidence of aortic valve stenosis. FINDINGS  Left Ventricle: Left ventricular ejection fraction, by estimation, is 30 to 35%. The left ventricle has moderately decreased function. The left ventricle demonstrates global hypokinesis. The left ventricular internal cavity size was severely dilated. There is borderline left ventricular hypertrophy. Left ventricular diastolic parameters are consistent with Grade III diastolic dysfunction (restrictive). Right Ventricle: The right ventricular size is normal. Mildly increased right ventricular wall thickness. Right ventricular systolic function is low normal. Left Atrium: Left atrial size was moderately dilated. Right Atrium: Right atrial size was moderately dilated. Pericardium: Trivial pericardial effusion is present. Mitral Valve: The mitral valve is normal in structure. Mild mitral valve regurgitation. MV peak gradient, 4.2 mmHg. The mean mitral valve gradient is 2.0 mmHg. Tricuspid Valve: The tricuspid valve is  abnormal. Tricuspid valve regurgitation is mild. Aortic Valve: The aortic valve is normal in structure. There is mild calcification of the aortic valve. There is mild thickening of the aortic valve. Aortic valve regurgitation is not visualized. Aortic valve sclerosis is present, with no evidence of aortic  valve stenosis. Aortic valve mean gradient measures 7.0 mmHg. Aortic valve peak gradient measures 12.3 mmHg. Aortic valve area, by VTI measures 1.07 cm. Pulmonic Valve: The pulmonic valve was normal in structure. Pulmonic valve regurgitation is not visualized. Aorta: The ascending aorta was not well visualized. IAS/Shunts: No atrial level shunt detected by color flow Doppler.  LEFT VENTRICLE PLAX 2D LVIDd:         6.10 cm      Diastology LVIDs:         5.30 cm      LV e' medial:    3.92 cm/s LV PW:         1.40 cm      LV E/e' medial:  29.8 LV IVS:        0.95 cm      LV e' lateral:   11.60 cm/s LVOT diam:     2.00 cm      LV E/e' lateral: 10.1 LV SV:         32 LV SV Index:   16 LVOT Area:     3.14 cm  LV Volumes (MOD) LV vol d, MOD A2C: 232.0 ml LV vol d, MOD A4C: 174.0 ml LV vol s, MOD A2C: 158.0 ml LV vol s, MOD A4C: 115.0 ml LV SV MOD A2C:     74.0 ml LV SV MOD A4C:     174.0 ml LV SV MOD BP:      68.6 ml RIGHT VENTRICLE RV Basal diam:  5.10 cm RV S prime:     8.70 cm/s TAPSE (M-mode): 2.1 cm LEFT ATRIUM             Index        RIGHT ATRIUM           Index LA diam:        5.00 cm 2.51 cm/m   RA Area:     20.70 cm LA Vol (A2C):   93.0 ml 46.65 ml/m  RA Volume:   68.20 ml  34.21 ml/m LA Vol (A4C):   66.9 ml 33.56 ml/m LA Biplane Vol: 82.8 ml 41.54 ml/m  AORTIC VALVE                     PULMONIC VALVE AV Area (Vmax):    1.12 cm      PV Vmax:        0.65 m/s AV Area (Vmean):   1.03 cm      PV Vmean:       40.500 cm/s AV Area (VTI):     1.07 cm      PV VTI:         0.111 m AV Vmax:           175.67 cm/s   PV Peak grad:   1.7 mmHg AV Vmean:          119.800 cm/s  PV Mean grad:   1.0 mmHg AV VTI:             0.303 m       RVOT Peak grad: 3 mmHg AV Peak Grad:      12.3 mmHg AV Mean Grad:      7.0 mmHg LVOT Vmax:         62.70 cm/s LVOT Vmean:        39.300 cm/s LVOT VTI:          0.103 m LVOT/AV VTI ratio: 0.34  AORTA Ao Root diam:  3.70 cm MITRAL VALVE                TRICUSPID VALVE MV Area (PHT): 4.99 cm     TR Peak grad:   19.2 mmHg MV Area VTI:   1.18 cm     TR Vmax:        219.00 cm/s MV Peak grad:  4.2 mmHg MV Mean grad:  2.0 mmHg     SHUNTS MV Vmax:       1.03 m/s     Systemic VTI:  0.10 m MV Vmean:      70.0 cm/s    Systemic Diam: 2.00 cm MV Decel Time: 152 msec     Pulmonic VTI:  0.105 m MV E velocity: 117.00 cm/s MV A velocity: 51.80 cm/s MV E/A ratio:  2.26 Yolonda Kida MD Electronically signed by Yolonda Kida MD Signature Date/Time: 09/16/2021/11:17:59 PM    Final     Microbiology: Results for orders placed or performed during the hospital encounter of 07/24/21  Urine Culture     Status: Abnormal   Collection Time: 07/24/21 10:41 AM   Specimen: Urine, Clean Catch  Result Value Ref Range Status   Specimen Description   Final    URINE, CLEAN CATCH Performed at [facility], [address], [place], [place] [address]    Special Requests   Final    NONE Performed at [facility], [address], [place], [place] [address]    Culture (A)  Final    <10,000 COLONIES/mL INSIGNIFICANT GROWTH Performed at [facility], [place] [address], [place], [place] [address]    Report Status 07/25/2021 FINAL  Final    Labs: CBC: Recent Labs  Lab 09/15/21 0831  WBC 6.2  HGB 12.8*  HCT 39.6  MCV 96.1  PLT 84*   Basic Metabolic Panel: Recent Labs  Lab 09/15/21 0831 09/16/21 0557 09/17/21 0508  NA 137 138 138  K 4.2 3.9 3.5  CL 105 103 101  CO2 '23 25 29  '$ GLUCOSE 94 115* 119*  BUN 30* 26* 21  CREATININE 1.72* 1.53* 1.53*  CALCIUM 8.3* 8.4* 8.2*   Liver Function Tests: Recent Labs  Lab 09/15/21 0831  AST 18  ALT 15  ALKPHOS 59  BILITOT 0.7  PROT  7.1  ALBUMIN 3.8   CBG: No results for input(s): GLUCAP in the last 168 hours.  Discharge time spent: greater than 30 minutes.  Signed: Fritzi Mandes, MD [facility] 09/17/2021

## 2021-09-17 NOTE — TOC Transition Note (Signed)
Transition of Care [facility]) - CM/SW Discharge Note   Patient Details  Name: GIULIO BERTINO MRN: [id number] Date of Birth: [birth year]  Transition of Care [facility]) CM/SW Contact:  Izola Price, RN Phone Number: 09/17/2021, 2:07 PM   Clinical Narrative:  5/20: Patient discharged to home/self care today. HF RN consulted completed prior to discharge and no other TOC needs identified per prior CM notes.    [name] RN CM       Final next level of care: Home/Self Care Barriers to Discharge: Barriers Resolved   Patient Goals and CMS Choice     Choice offered to / list presented to : NA  Discharge Placement                       Discharge Plan and Services                DME Arranged: N/A DME Agency: NA       HH Arranged: NA          Social Determinants of Health (SDOH) Interventions     Readmission Risk Interventions    07/27/2021   12:31 PM  Readmission Risk Prevention Plan  Transportation Screening Complete  PCP or Specialist Appt within 3-5 Days Complete  Social Work Consult for [place] Planning/Counseling Complete  Palliative Care Screening Not Applicable  Medication Review [employer]) Complete

## 2021-09-19 ENCOUNTER — Encounter: Payer: Medicare Other | Admitting: *Deleted

## 2021-09-19 DIAGNOSIS — Z951 Presence of aortocoronary bypass graft: Secondary | ICD-10-CM

## 2021-09-19 NOTE — Progress Notes (Signed)
Daily Session Note  Patient Details  Name: Jermaine Kramer MRN: [id number] Date of Birth: 1950/10/12 Referring Provider:   Flowsheet Row Cardiac Rehab from 07/19/2021 in [facility] Cardiac and Pulmonary Rehab  Referring Provider Ernesta Amble MD       Encounter Date: 09/19/2021  Check In:  Session Check In - 09/19/21 1027       Check-In   Supervising physician immediately available to respond to emergencies See telemetry face sheet for immediately available ER MD    Location [name]    Staff Present Heath Lark, RN, BSN, CCRP;Kelly Bollinger, MPA, [place], BS, ACSM CEP, Exercise Physiologist    Virtual Visit No    Medication changes reported     No    Fall or balance concerns reported    No    Warm-up and Cool-down Performed on first and last piece of equipment    Resistance Training Performed Yes    VAD Patient? No    PAD/SET Patient? No      Pain Assessment   Currently in Pain? No/denies                Social History   Tobacco Use  Smoking Status Never  Smokeless Tobacco Never    Goals Met:  Independence with exercise equipment Exercise tolerated well No report of concerns or symptoms today  Goals Unmet:  Not Applicable  Comments: Pt able to follow exercise prescription today without complaint.  Will continue to monitor for progression.    Dr. Emily Filbert is Medical Director for [place].  Dr. Ottie Glazier is Medical Director for [facility] Pulmonary Rehabilitation.

## 2021-09-20 NOTE — Progress Notes (Unsigned)
Patient ID: Jermaine Kramer, male    DOB: [birth year], 71 y.o.   MRN: [id number]  HPI  Jermaine Kramer is a 71 y/o male with a history of leukemia, DM, CAD (CABG 02/16/21), AF, sleep apnea, hyperlipidemia, HTN, stroke, GERD & chronic heart failure.   Echo report from 09/16/21 reviewed and showed an EF of 30-35% along with mild Jermaine. Echo report from 05/16/21 reviewed and showed and EF of <20% along with mild/moderate LAE and mild/moderate Jermaine.   Admitted 09/15/21 due to worsening SOB and weight gain due to acute on chronic HF. Given IV lasix with transition to oral diuretics. Cardiology consult obtained. ACEi deferred to renal function. Discharged after 2 days. Admitted 07/24/21 due to  worsening left flank pain today, which is constant, sharp, 8 out of 10 in severity, nonradiating. CT of lumbar spine is negative for acute injury, but showed degenerative disc disease.  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 Urology and GI consults obtained. Discharged after 3 days. Was in the ED 07/21/21 due to left sided flank pain. CT as positive for a kidney stone that is in the left kidney, nonobstructing. He was released with pain medication. AICD implanted 06/22/21. Admitted 05/14/21 due to lower extremity edema, increasing shortness of breath, cough and wheezing. Initially given IV lasix with transition to oral diuretics. Cardiology consult obtained. Discharged after 4 days.   He presents today for a follow-up visit with a chief complaint of moderate fatigue upon minimal exertion. Describes this as chronic in nature. He has associated shortness of breath and abdominal distention (worsening) along with this. He denies any difficulty sleeping, dizziness, palpitations, pedal edema, chest pain, cough or weight gain.   Saw cardiology yesterday and 12.'5mg'$  spironolactone was started. Patient took his first dose this morning. Saw EP earlier today  No shocking from his AICD.  Past  Medical History:  Diagnosis Date   Arrhythmia    atrial fibrillation   CHF (congestive heart failure) (HCC)    Coronary artery disease    Diabetes mellitus without complication (HCC)    GERD (gastroesophageal reflux disease)    Hypercholesteremia    Hypertension    Sleep apnea    Stroke [facility])    Past Surgical History:  Procedure Laterality Date   CHOLECYSTECTOMY     COLONOSCOPY WITH PROPOFOL N/A 04/14/2019   Procedure: COLONOSCOPY WITH PROPOFOL;  Surgeon: Toledo, Benay Pike, MD;  Location: [facility] ENDOSCOPY;  Service: Gastroenterology;  Laterality: N/A;   CORONARY ARTERY BYPASS GRAFT  02/16/2021   ICD IMPLANT N/A 06/22/2021   Procedure: ICD IMPLANT;  Surgeon: Vickie Epley, MD;  Location: [place] [facility];  Service: Cardiovascular;  Laterality: N/A;   KNEE ARTHROSCOPY     RENAL CYST EXCISION     Family History  Problem Relation Age of Onset   Parkinson's disease Mother    Lupus Mother    Heart disease Father    Social History   Tobacco Use   Smoking status: Never   Smokeless tobacco: Never  Substance Use Topics   Alcohol use: Not Currently   Allergies  Allergen Reactions   Doxycycline Anaphylaxis    Patient does not recall having this reaction.     Heparin Other (See Comments)    heparin-induced thrombocytopenia    Empagliflozin Itching    Groin infection Caused a groin infection.     Prior to Admission medications   Medication Sig Start Date End Date Taking? Authorizing Provider  amiodarone (  PACERONE) 200 MG tablet Take 200 mg by mouth daily. 03/06/21  Yes [provider]  amoxicillin (AMOXIL) 500 MG tablet Take 500 mg by mouth every 8 (eight) hours. 09/14/21  Yes [provider]  apixaban (ELIQUIS) 5 MG TABS tablet Take 1 tablet (5 mg total) by mouth 2 (two) times daily. DO NOT RESTART UNTIL 06/27/2021. 06/22/21  Yes Dunn, Areta Haber, PA-C  aspirin EC 81 MG tablet Take 81 mg by mouth daily.   Yes [provider]  atorvastatin (LIPITOR)  80 MG tablet Take 80 mg by mouth daily. 02/10/21  Yes [provider]  furosemide (LASIX) 40 MG tablet Take 1 tablet (40 mg total) by mouth daily. Patient taking differently: Take 20 mg by mouth daily. 09/17/21  Yes Fritzi Mandes, MD  glucose blood (PRECISION QID TEST) test strip Use 3 (three) times daily Use as instructed. One touch ultra 07/05/21 07/05/22 Yes [provider]  metFORMIN (GLUCOPHAGE) 1000 MG tablet Take 1,000 mg by mouth 2 (two) times daily with a meal.   Yes [provider]  metoprolol succinate (TOPROL-XL) 25 MG 24 hr tablet Take 12.5 mg by mouth every evening.   Yes [provider]  omeprazole (PRILOSEC) 40 MG capsule Take 40 mg by mouth daily. 03/11/21  Yes [provider]  ondansetron (ZOFRAN) 4 MG tablet Take 4 mg by mouth every 8 (eight) hours as needed. 09/13/21  Yes [provider]  polyethylene glycol (MIRALAX / GLYCOLAX) 17 g packet Take 17 g by mouth daily. 09/17/21  Yes Fritzi Mandes, MD  spironolactone (ALDACTONE) 25 MG tablet Take 12.5 mg by mouth daily. 09/20/21  Yes [provider]  tamsulosin (FLOMAX) 0.4 MG CAPS capsule Take 0.8 mg by mouth in the morning and at bedtime.   Yes [provider]  lactulose (CHRONULAC) 10 GM/15ML solution Take 30 mLs (20 g total) by mouth 2 (two) times daily. Patient not taking: Reported on 09/21/2021 09/17/21   Fritzi Mandes, MD    Review of Systems  Constitutional:  Positive for fatigue (easily). Negative for appetite change.  HENT:  Negative for congestion, postnasal drip and sore throat.   Eyes: Negative.   Respiratory:  Positive for shortness of breath (easily). Negative for cough and chest tightness.   Cardiovascular:  Negative for chest pain, palpitations and leg swelling.  Gastrointestinal:  Positive for abdominal distention and constipation. Negative for abdominal pain.  Endocrine: Negative.   Genitourinary: Negative.   Musculoskeletal:  Negative for back pain and  neck pain.  Skin: Negative.   Allergic/Immunologic: Negative.   Neurological:  Negative for dizziness and light-headedness.  Hematological:  Negative for adenopathy. Does not bruise/bleed easily.  Psychiatric/Behavioral:  Negative for dysphoric mood and sleep disturbance (sleeping on 2 pillows). The patient is not nervous/anxious.    Vitals:   09/21/21 0947  BP: 109/68  Pulse: 73  Resp: 18  SpO2: 96%  Weight: 191 lb 8 oz (86.9 kg)  Height: '5\' 7"'$  (1.702 m)   Wt Readings from Last 3 Encounters:  09/21/21 191 lb 8 oz (86.9 kg)  09/21/21 191 lb (86.6 kg)  09/17/21 189 lb 13.1 oz (86.1 kg)   Lab Results  Component Value Date   CREATININE 1.53 (H) 09/17/2021   CREATININE 1.53 (H) 09/16/2021   CREATININE 1.72 (H) 09/15/2021   Physical Exam Vitals and nursing note reviewed. Exam conducted with a chaperone present (wife).  Constitutional:      Appearance: Normal appearance.  HENT:     Head:  Normocephalic and atraumatic.  Cardiovascular:     Rate and Rhythm: Normal rate and regular rhythm.  Pulmonary:     Effort: Pulmonary effort is normal. No respiratory distress.     Breath sounds: No wheezing or rales.  Abdominal:     General: There is distension.     Tenderness: There is no abdominal tenderness.  Musculoskeletal:        General: No tenderness.     Cervical back: Normal range of motion and neck supple.     Right lower leg: Edema (trace pitting) present.     Left lower leg: No edema.  Skin:    General: Skin is warm and dry.  Neurological:     General: No focal deficit present.     Mental Status: He is alert and oriented to person, place, and time.  Psychiatric:        Mood and Affect: Mood normal.        Behavior: Behavior normal.        Thought Content: Thought content normal.   Assessment & Plan:  1: Chronic heart failure with reduced ejection fraction- - NYHA class III - euvolemic today - weighing daily and home weight chart reviewed & weight ranges from  187-190 pounds over last several days; reminded to call for an overnight weight gain of > 2 pounds or a weekly weight gain of >5 pounds - weight stable from last visit here 6 weeks ago - not adding "much" salt and has been reading food labels for sodium content; understands to keep daily sodium intake to '2000mg'$  / day - on GDMT of metoprolol and spironolactone; lisinopril has been held due to AKI - he just started 12.'5mg'$  spironolactone this morning - consider changing his diuretic from furosemide to torsemide to help with the abdominal distention - check labs next visit - allergic to empagliflozin with a yeast infection - saw EP Quentin Ore) earlier today 09/21/21  - had AICD implanted 06/22/21; no shocks have been delivered - BNP 09/15/21 was 898.7 - PharmD reconciled medications  2: HTN- - BP looks good (109/68); started spironolactone per above  - saw PCP [name]) 08/03/21 - BMP 09/20/21 reviewed and showed sodium 142, potassium 4.0, creatinine 1.6 & GFR 43  3: DM- - A1c 08/03/21 was 7.3% - glucose at home 112 this morning  4: Atrial fibrillation- - saw cardiology Corky Sox) 09/20/21 - currently on amiodarone, metoprolol and apixaban  5: CAD- - CABG with a LIMA to LAD, SVG to ramus, and SVG to PDA on 02/16/21    Medication bottles reviewed.   Return in 2 weeks, sooner if needed

## 2021-09-21 ENCOUNTER — Ambulatory Visit (INDEPENDENT_AMBULATORY_CARE_PROVIDER_SITE_OTHER): Payer: Medicare Other | Admitting: Cardiology

## 2021-09-21 ENCOUNTER — Ambulatory Visit: Payer: Medicare Other | Attending: Family | Admitting: Family

## 2021-09-21 ENCOUNTER — Ambulatory Visit (INDEPENDENT_AMBULATORY_CARE_PROVIDER_SITE_OTHER): Payer: Medicare Other

## 2021-09-21 ENCOUNTER — Encounter: Payer: Self-pay | Admitting: Family

## 2021-09-21 ENCOUNTER — Encounter: Payer: Self-pay | Admitting: Cardiology

## 2021-09-21 VITALS — BP 108/68 | HR 78 | Ht 67.0 in | Wt 191.0 lb

## 2021-09-21 VITALS — BP 109/68 | HR 73 | Resp 18 | Ht 67.0 in | Wt 191.5 lb

## 2021-09-21 DIAGNOSIS — I5022 Chronic systolic (congestive) heart failure: Secondary | ICD-10-CM

## 2021-09-21 DIAGNOSIS — Z951 Presence of aortocoronary bypass graft: Secondary | ICD-10-CM | POA: Diagnosis not present

## 2021-09-21 DIAGNOSIS — I4891 Unspecified atrial fibrillation: Secondary | ICD-10-CM | POA: Diagnosis not present

## 2021-09-21 DIAGNOSIS — N179 Acute kidney failure, unspecified: Secondary | ICD-10-CM | POA: Diagnosis not present

## 2021-09-21 DIAGNOSIS — I10 Essential (primary) hypertension: Secondary | ICD-10-CM

## 2021-09-21 DIAGNOSIS — K219 Gastro-esophageal reflux disease without esophagitis: Secondary | ICD-10-CM | POA: Diagnosis not present

## 2021-09-21 DIAGNOSIS — I255 Ischemic cardiomyopathy: Secondary | ICD-10-CM | POA: Diagnosis not present

## 2021-09-21 DIAGNOSIS — Z8673 Personal history of transient ischemic attack (TIA), and cerebral infarction without residual deficits: Secondary | ICD-10-CM | POA: Insufficient documentation

## 2021-09-21 DIAGNOSIS — Z7901 Long term (current) use of anticoagulants: Secondary | ICD-10-CM | POA: Insufficient documentation

## 2021-09-21 DIAGNOSIS — E119 Type 2 diabetes mellitus without complications: Secondary | ICD-10-CM | POA: Insufficient documentation

## 2021-09-21 DIAGNOSIS — R140 Abdominal distension (gaseous): Secondary | ICD-10-CM | POA: Diagnosis not present

## 2021-09-21 DIAGNOSIS — G4730 Sleep apnea, unspecified: Secondary | ICD-10-CM | POA: Diagnosis not present

## 2021-09-21 DIAGNOSIS — Z95810 Presence of automatic (implantable) cardiac defibrillator: Secondary | ICD-10-CM

## 2021-09-21 DIAGNOSIS — I480 Paroxysmal atrial fibrillation: Secondary | ICD-10-CM

## 2021-09-21 DIAGNOSIS — E1122 Type 2 diabetes mellitus with diabetic chronic kidney disease: Secondary | ICD-10-CM

## 2021-09-21 DIAGNOSIS — I110 Hypertensive heart disease with heart failure: Secondary | ICD-10-CM | POA: Diagnosis present

## 2021-09-21 DIAGNOSIS — I2510 Atherosclerotic heart disease of native coronary artery without angina pectoris: Secondary | ICD-10-CM | POA: Diagnosis not present

## 2021-09-21 DIAGNOSIS — N1832 Chronic kidney disease, stage 3b: Secondary | ICD-10-CM

## 2021-09-21 LAB — PACEMAKER DEVICE OBSERVATION

## 2021-09-21 NOTE — Patient Instructions (Signed)
Medications: Your physician recommends that you continue on your current medications as directed. Please refer to the Current Medication list given to you today. *If you need a refill on your cardiac medications before your next appointment, please call your pharmacy*  Lab Work: None. If you have labs (blood work) drawn today and your tests are completely normal, you will receive your results only by: MyChart Message (if you have MyChart) OR A paper copy in the mail If you have any lab test that is abnormal or we need to change your treatment, we will call you to review the results.  Testing/Procedures: None.  Follow-Up: At [facility], you and your health needs are our priority.  As part of our continuing mission to provide you with exceptional heart care, we have created designated Provider Care Teams.  These Care Teams include your primary Cardiologist (physician) and Advanced Practice Providers (APPs -  Physician Assistants and Nurse Practitioners) who all work together to provide you with the care you need, when you need it.  Your physician wants you to follow-up in: 12 months with Jermaine Lambert, MD     You will receive a reminder letter in the mail two months in advance. If you don't receive a letter, please call our office to schedule the follow-up appointment.  We recommend signing up for the patient portal called "MyChart".  Sign up information is provided on this After Visit Summary.  MyChart is used to connect with patients for Virtual Visits (Telemedicine).  Patients are able to view lab/test results, encounter notes, upcoming appointments, etc.  Non-urgent messages can be sent to your provider as well.   To learn more about what you can do with MyChart, go to [email or web address].    Any Other Special Instructions Will Be Listed Below (If Applicable).  

## 2021-09-21 NOTE — Progress Notes (Signed)
Electrophysiology Office Follow up Visit Note:    Date:  09/21/2021   ID:  Jermaine, Kramer 01/05/1951, MRN [id number]  PCP:  Sherre Scarlet, PA-C   [facility] Electrophysiologist:  Vickie Epley, MD    Interval History:    Jermaine Kramer is a 71 y.o. male who presents for a follow up visit after his ICD was implanted 06/22/2021. Since I last saw him he was admitted with decompensated systolic heart failure. He was diuresed and ultimately discharge 09/17/2021 (admit 09/15/2021).  He is with his wife today in clinic.  He tells me he is doing well since leaving the hospital.  He weighs himself daily.  His weight has been stable recently.  They are trying to avoid sodium.  Avoiding eating out much.      Past Medical History:  Diagnosis Date   Arrhythmia    atrial fibrillation   CHF (congestive heart failure) (HCC)    Coronary artery disease    Diabetes mellitus without complication (HCC)    GERD (gastroesophageal reflux disease)    Hypercholesteremia    Hypertension    Sleep apnea    Stroke [facility])     Past Surgical History:  Procedure Laterality Date   CHOLECYSTECTOMY     COLONOSCOPY WITH PROPOFOL N/A 04/14/2019   Procedure: COLONOSCOPY WITH PROPOFOL;  Surgeon: Toledo, Benay Pike, MD;  Location: [facility] ENDOSCOPY;  Service: Gastroenterology;  Laterality: N/A;   CORONARY ARTERY BYPASS GRAFT  02/16/2021   ICD IMPLANT N/A 06/22/2021   Procedure: ICD IMPLANT;  Surgeon: Vickie Epley, MD;  Location: [place] [facility];  Service: Cardiovascular;  Laterality: N/A;   KNEE ARTHROSCOPY     RENAL CYST EXCISION      Current Medications: Current Meds  Medication Sig   amiodarone (PACERONE) 200 MG tablet Take 200 mg by mouth daily.   amoxicillin (AMOXIL) 500 MG tablet Take 500 mg by mouth every 8 (eight) hours.   apixaban (ELIQUIS) 5 MG TABS tablet Take 1 tablet (5 mg total) by mouth 2 (two) times daily. DO NOT RESTART UNTIL 06/27/2021.   aspirin EC 81 MG tablet Take 81  mg by mouth daily.   atorvastatin (LIPITOR) 80 MG tablet Take 80 mg by mouth daily.   furosemide (LASIX) 40 MG tablet Take 1 tablet (40 mg total) by mouth daily.   glucose blood (PRECISION QID TEST) test strip Use 3 (three) times daily Use as instructed. One touch ultra   lactulose (CHRONULAC) 10 GM/15ML solution Take 30 mLs (20 g total) by mouth 2 (two) times daily.   metFORMIN (GLUCOPHAGE) 1000 MG tablet Take 1,000 mg by mouth 2 (two) times daily with a meal.   metoprolol succinate (TOPROL-XL) 25 MG 24 hr tablet Take 12.5 mg by mouth every evening.   omeprazole (PRILOSEC) 40 MG capsule Take 40 mg by mouth daily.   ondansetron (ZOFRAN) 4 MG tablet Take 4 mg by mouth every 8 (eight) hours as needed.   polyethylene glycol (MIRALAX / GLYCOLAX) 17 g packet Take 17 g by mouth daily.   tamsulosin (FLOMAX) 0.4 MG CAPS capsule Take 0.8 mg by mouth daily.     Allergies:   Doxycycline, Heparin, and Empagliflozin   Social History   Socioeconomic History   Marital status: Married    Spouse name: Not on file   Number of children: Not on file   Years of education: Not on file   Highest education level: Not on file  Occupational History  Not on file  Tobacco Use   Smoking status: Never   Smokeless tobacco: Never  Vaping Use   Vaping Use: Never used  Substance and Sexual Activity   Alcohol use: Not Currently   Drug use: No   Sexual activity: Not on file  Other Topics Concern   Not on file  Social History Narrative   Not on file   Social Determinants of Health   Financial Resource Strain: Not on file  Food Insecurity: Not on file  Transportation Needs: Not on file  Physical Activity: Not on file  Stress: Not on file  Social Connections: Not on file     Family History: The patient's family history includes Heart disease in his father; Lupus in his mother; Parkinson's disease in his mother.  ROS:   Please see the history of present illness.    All other systems reviewed and are  negative.  EKGs/Labs/Other Studies Reviewed:    The following studies were reviewed today:  09/21/2021 in clinic device interrogation personally reviewed Stable lead parameters Battery longevity okay.  10.1 years No high-voltage therapies Circular pacing less than 0.1% Atrial pacing 10.5% Presenting rhythm a sensed, V sensed at 73 bpm   Recent Labs: 05/18/2021: Magnesium 2.2 09/15/2021: ALT 15; B Natriuretic Peptide 898.7; Hemoglobin 12.8; Platelets 84 09/17/2021: BUN 21; Creatinine, Ser 1.53; Potassium 3.5; Sodium 138  Recent Lipid Panel No results found for: CHOL, TRIG, HDL, CHOLHDL, VLDL, LDLCALC, LDLDIRECT  Physical Exam:    VS:  BP 108/68   Pulse 78   Ht '5\' 7"'$  (1.702 m)   Wt 191 lb (86.6 kg)   SpO2 94%   BMI 29.91 kg/m     Wt Readings from Last 3 Encounters:  09/21/21 191 lb (86.6 kg)  09/17/21 189 lb 13.1 oz (86.1 kg)  08/09/21 193 lb 9.6 oz (87.8 kg)     GEN:  Well nourished, well developed in no acute distress.  Obese HEENT: Normal NECK: No JVD; No carotid bruits LYMPHATICS: No lymphadenopathy CARDIAC: RRR, no murmurs, rubs, gallops.  ICD pocket well-healed RESPIRATORY:  Clear to auscultation without rales, wheezing or rhonchi  ABDOMEN: Soft, non-tender, non-distended MUSCULOSKELETAL:  No edema; No deformity  SKIN: Warm and dry NEUROLOGIC:  Alert and oriented x 3 PSYCHIATRIC:  Normal affect        ASSESSMENT:    1. Chronic systolic heart failure ([place])   2. Primary hypertension   3. Cardiac defibrillator in situ    PLAN:    In order of problems listed above:  #Chronic systolic heart failure NYHA class II-III.  Warm and relatively euvolemic on exam today.  Is at his dry weight according to the patient.  Continue metoprolol, Lasix.  Blood pressure marginal to consider starting Entresto.  Has follow-up later this week with Darylene Price in the heart failure clinic.  #ICD in situ No high-voltage therapies.  Continue remote monitoring.  Message sent  to [facility] in [place] Corky Sox.      Medication Adjustments/Labs and Tests Ordered: Current medicines are reviewed at length with the patient today.  Concerns regarding medicines are outlined above.  No orders of the defined types were placed in this encounter.  No orders of the defined types were placed in this encounter.    Signed, Lars Mage, MD, [facility], [facility] 09/21/2021 9:32 AM    Electrophysiology [facility]

## 2021-09-21 NOTE — Patient Instructions (Signed)
Continue weighing daily and call for an overnight weight gain of 3 pounds or more or a weekly weight gain of more than 5 pounds.   If you have voicemail, please make sure your mailbox is cleaned out so that we may leave a message and please make sure to listen to any voicemails.     

## 2021-09-21 NOTE — Progress Notes (Signed)
Patient ID: Jermaine Kramer, male   DOB: 02-20-1951, 71 y.o.   MRN: [id number] [place] COUNSELING NOTE  Guideline-Directed Medical Therapy/Evidence Based Medicine  ACE/ARB/ARNI:  holding due to recent admission with AKI Beta Blocker: Metoprolol succinate 12.5 mg daily Aldosterone Antagonist: Spironolactone 12.5 mg daily Diuretic: Furosemide 40 mg daily SGLT2i:  N/a  Adherence Assessment  Do you ever forget to take your medication? '[]'$ Yes '[x]'$ No  Do you ever skip doses due to side effects? '[]'$ Yes '[x]'$ No  Do you have trouble affording your medicines? '[]'$ Yes '[x]'$ No  Are you ever unable to pick up your medication due to transportation difficulties? '[]'$ Yes '[x]'$ No  Do you ever stop taking your medications because you don't believe they are helping? '[]'$ Yes '[x]'$ No  Do you check your weight daily? '[x]'$ Yes '[]'$ No   Adherence strategy: wife manages medications  Barriers to obtaining medications: none  Vital signs: HR 73, BP 109/68, weight (pounds) 191 lb ECHO: Date 09/16/21, EF 30-35%, notes left ventricle global hypokinesis, left ventricle severely dilated     Latest Ref Rng & Units 09/17/2021    5:08 AM 09/16/2021    5:57 AM 09/15/2021    8:31 AM  BMP  Glucose 70 - 99 mg/dL 119   115   94    BUN 8 - 23 mg/dL '21   26   30    '$ Creatinine 0.61 - 1.24 mg/dL 1.53   1.53   1.72    Sodium 135 - 145 mmol/L 138   138   137    Potassium 3.5 - 5.1 mmol/L 3.5   3.9   4.2    Chloride 98 - 111 mmol/L 101   103   105    CO2 22 - 32 mmol/L '29   25   23    '$ Calcium 8.9 - 10.3 mg/dL 8.2   8.4   8.3      Past Medical History:  Diagnosis Date   Arrhythmia    atrial fibrillation   CHF (congestive heart failure) (HCC)    Coronary artery disease    Diabetes mellitus without complication (HCC)    GERD (gastroesophageal reflux disease)    Hypercholesteremia    Hypertension    Sleep apnea    Stroke [facility])     ASSESSMENT 71 year old male who presents to  the HF clinic for follow up. Recently discharged from hospital on 09/17/2021 after presenting with shortness of breath refractory to PO diuretic. Taking blood pressure and weight daily and keeps record in diary. Since discharge, weight has improved.  Blood pressure in clinic is consistent with previous and home readings. Not taking SGLT2i, as patient previously experienced groin infection while on Jardiance. No cost concerns and able to afford all medications.  Typically experiences edema in abdominal region. Wife expresses that patient is experiencing stomach upset after taking medications.  PLAN Take omeprazole and atorvastatin in the evening and take metformin with meals to improve GI upset.  Reduce sodium intake and continue diuretics to improve fluid overload, which may assist in GI symptoms.  Continue current GDMT  Time spent: 20 minutes  Wynelle Cleveland, PharmD Pharmacy Resident  09/21/2021 11:38 AM    Current Outpatient Medications:    amiodarone (PACERONE) 200 MG tablet, Take 200 mg by mouth daily., Disp: , Rfl:    amoxicillin (AMOXIL) 500 MG tablet, Take 500 mg by mouth every 8 (eight) hours., Disp: , Rfl:    apixaban (ELIQUIS) 5 MG  TABS tablet, Take 1 tablet (5 mg total) by mouth 2 (two) times daily. DO NOT RESTART UNTIL 06/27/2021., Disp: 60 tablet, Rfl:    aspirin EC 81 MG tablet, Take 81 mg by mouth daily., Disp: , Rfl:    atorvastatin (LIPITOR) 80 MG tablet, Take 80 mg by mouth daily., Disp: , Rfl:    furosemide (LASIX) 40 MG tablet, Take 1 tablet (40 mg total) by mouth daily. (Patient taking differently: Take 20 mg by mouth daily.), Disp: 30 tablet, Rfl: 1   glucose blood (PRECISION QID TEST) test strip, Use 3 (three) times daily Use as instructed. One touch ultra, Disp: , Rfl:    lactulose (CHRONULAC) 10 GM/15ML solution, Take 30 mLs (20 g total) by mouth 2 (two) times daily. (Patient not taking: Reported on 09/21/2021), Disp: 236 mL, Rfl: 0   metFORMIN (GLUCOPHAGE) 1000 MG  tablet, Take 1,000 mg by mouth 2 (two) times daily with a meal., Disp: , Rfl:    metoprolol succinate (TOPROL-XL) 25 MG 24 hr tablet, Take 12.5 mg by mouth every evening., Disp: , Rfl:    omeprazole (PRILOSEC) 40 MG capsule, Take 40 mg by mouth daily., Disp: , Rfl:    ondansetron (ZOFRAN) 4 MG tablet, Take 4 mg by mouth every 8 (eight) hours as needed., Disp: , Rfl:    polyethylene glycol (MIRALAX / GLYCOLAX) 17 g packet, Take 17 g by mouth daily., Disp: 14 each, Rfl: 0   spironolactone (ALDACTONE) 25 MG tablet, Take 12.5 mg by mouth daily., Disp: , Rfl:    tamsulosin (FLOMAX) 0.4 MG CAPS capsule, Take 0.8 mg by mouth in the morning and at bedtime., Disp: , Rfl:    COUNSELING POINTS/CLINICAL PEARLS    DRUGS TO CAUTION IN HEART FAILURE  Drug or Class Mechanism  Analgesics NSAIDs COX-2 inhibitors Glucocorticoids  Sodium and water retention, increased systemic vascular resistance, decreased response to diuretics   Diabetes Medications Metformin Thiazolidinediones Rosiglitazone (Avandia) Pioglitazone (Actos) DPP4 Inhibitors Saxagliptin (Onglyza) Sitagliptin (Januvia)   Lactic acidosis Possible calcium channel blockade   Unknown  Antiarrhythmics Class I  Flecainide Disopyramide Class III Sotalol Other Dronedarone  Negative inotrope, proarrhythmic   Proarrhythmic, beta blockade  Negative inotrope  Antihypertensives Alpha Blockers Doxazosin Calcium Channel Blockers Diltiazem Verapamil Nifedipine Central Alpha Adrenergics Moxonidine Peripheral Vasodilators Minoxidil  Increases renin and aldosterone  Negative inotrope    Possible sympathetic withdrawal  Unknown  Anti-infective Itraconazole Amphotericin B  Negative inotrope Unknown  Hematologic Anagrelide Cilostazol   Possible inhibition of PD IV Inhibition of PD III causing arrhythmias  Neurologic/Psychiatric Stimulants Anti-Seizure  Drugs Carbamazepine Pregabalin Antidepressants Tricyclics Citalopram Parkinsons Bromocriptine Pergolide Pramipexole Antipsychotics Clozapine Antimigraine Ergotamine Methysergide Appetite suppressants Bipolar Lithium  Peripheral alpha and beta agonist activity  Negative inotrope and chronotrope Calcium channel blockade  Negative inotrope, proarrhythmic Dose-dependent QT prolongation  Excessive serotonin activity/valvular damage Excessive serotonin activity/valvular damage Unknown  IgE mediated hypersensitivy, calcium channel blockade  Excessive serotonin activity/valvular damage Excessive serotonin activity/valvular damage Valvular damage  Direct myofibrillar degeneration, adrenergic stimulation  Antimalarials Chloroquine Hydroxychloroquine Intracellular inhibition of lysosomal enzymes  Urologic Agents Alpha Blockers Doxazosin Prazosin Tamsulosin Terazosin  Increased renin and aldosterone  Adapted from Page Carleene Overlie, et al. "Drugs That May Cause or Exacerbate Heart Failure: A Scientific Statement from the American Heart  Association." Circulation 2016; 134:e32-e69. DOI: 10.1161/CIR.0000000000000426   MEDICATION ADHERENCES TIPS AND STRATEGIES Taking medication as prescribed improves patient outcomes in heart failure (reduces hospitalizations, improves symptoms, increases survival) Side effects of medications can be managed by decreasing  doses, switching agents, stopping drugs, or adding additional therapy. Please let someone in the [facility] know if you have having bothersome side effects so we can modify your regimen. Do not alter your medication regimen without talking to [place].  Medication reminders can help patients remember to take drugs on time. If you are missing or forgetting doses you can try linking behaviors, using pill boxes, or an electronic reminder like an alarm on your phone or an app. Some people can also get automated phone calls as medication  reminders.

## 2021-09-22 LAB — CUP PACEART REMOTE DEVICE CHECK
Battery Remaining Longevity: 121 mo
Battery Voltage: 3.02 V
Brady Statistic AP VP Percent: 0.02 %
Brady Statistic AP VS Percent: 10.43 %
Brady Statistic AS VP Percent: 0.03 %
Brady Statistic AS VS Percent: 89.52 %
Brady Statistic RA Percent Paced: 10.39 %
Brady Statistic RV Percent Paced: 0.05 %
Date Time Interrogation Session: 20230524105957
HighPow Impedance: 51 Ohm
Implantable Lead Implant Date: 20230222
Implantable Lead Implant Date: 20230222
Implantable Lead Location: 753859
Implantable Lead Location: 753860
Implantable Lead Model: 5076
Implantable Pulse Generator Implant Date: 20230222
Lead Channel Impedance Value: 399 Ohm
Lead Channel Impedance Value: 475 Ohm
Lead Channel Impedance Value: 513 Ohm
Lead Channel Pacing Threshold Amplitude: 0.625 V
Lead Channel Pacing Threshold Amplitude: 0.875 V
Lead Channel Pacing Threshold Pulse Width: 0.4 ms
Lead Channel Pacing Threshold Pulse Width: 0.4 ms
Lead Channel Sensing Intrinsic Amplitude: 1.5 mV
Lead Channel Sensing Intrinsic Amplitude: 1.625 mV
Lead Channel Sensing Intrinsic Amplitude: 10.375 mV
Lead Channel Sensing Intrinsic Amplitude: 9.375 mV
Lead Channel Setting Pacing Amplitude: 2 V
Lead Channel Setting Pacing Amplitude: 2.5 V
Lead Channel Setting Pacing Pulse Width: 0.4 ms
Lead Channel Setting Sensing Sensitivity: 0.3 mV

## 2021-09-28 ENCOUNTER — Ambulatory Visit
Admission: RE | Admit: 2021-09-28 | Discharge: 2021-09-28 | Disposition: A | Discharge status: Home / Self Care | Payer: Medicare Other | Source: Ambulatory Visit | Attending: Family | Admitting: Family

## 2021-09-28 ENCOUNTER — Telehealth: Payer: Self-pay | Admitting: Family

## 2021-09-28 ENCOUNTER — Other Ambulatory Visit: Payer: Self-pay | Admitting: Family

## 2021-09-28 DIAGNOSIS — I5023 Acute on chronic systolic (congestive) heart failure: Secondary | ICD-10-CM | POA: Insufficient documentation

## 2021-09-28 LAB — BASIC METABOLIC PANEL
Anion gap: 11 (ref 5–15)
BUN: 27 mg/dL — ABNORMAL HIGH (ref 8–23)
CO2: 21 mmol/L — ABNORMAL LOW (ref 22–32)
Calcium: 8.8 mg/dL — ABNORMAL LOW (ref 8.9–10.3)
Chloride: 103 mmol/L (ref 98–111)
Creatinine, Ser: 1.89 mg/dL — ABNORMAL HIGH (ref 0.61–1.24)
GFR, Estimated: 37 mL/min — ABNORMAL LOW (ref >60)
Glucose, Bld: 187 mg/dL — ABNORMAL HIGH (ref 70–99)
Potassium: 4.8 mmol/L (ref 3.5–5.1)
Sodium: 135 mmol/L (ref 135–145)

## 2021-09-28 LAB — BRAIN NATRIURETIC PEPTIDE: B Natriuretic Peptide: 1480.2 pg/mL — ABNORMAL HIGH (ref 0.0–100.0)

## 2021-09-28 MED ORDER — POTASSIUM CHLORIDE CRYS ER 20 MEQ PO TBCR: 40.0000 meq | EXTENDED_RELEASE_TABLET | Freq: Once | ORAL | Status: AC

## 2021-09-28 MED ORDER — FUROSEMIDE 10 MG/ML IJ SOLN
INTRAMUSCULAR | Status: AC
  Administered 2021-09-28: 80 mg via INTRAVENOUS
  Filled 2021-09-28: qty 8

## 2021-09-28 MED ORDER — FUROSEMIDE 10 MG/ML IJ SOLN: 80.0000 mg | Freq: Once | INTRAMUSCULAR | Status: AC

## 2021-09-28 MED ORDER — SODIUM CHLORIDE FLUSH 0.9 % IV SOLN
INTRAVENOUS | Status: DC
Start: 2021-09-28 — End: 2021-09-29
  Filled 2021-09-28: qty 10

## 2021-09-28 MED ORDER — POTASSIUM CHLORIDE CRYS ER 20 MEQ PO TBCR
EXTENDED_RELEASE_TABLET | ORAL | Status: AC
  Administered 2021-09-28: 40 meq via ORAL
  Filled 2021-09-28: qty 2

## 2021-09-28 NOTE — Telephone Encounter (Signed)
Patient's wife called to say that patient's abdominal distention had worsened and he's more SOB and wheezing than normal. Says that he feels quite full. No pedal edema and not much change in his weight.   Will bring him in for '80mg'$  IV lasxi/ 74mq PO potassium today. Will check BMP/BNP today. Will see patient in clinic in 2 days to reassess.

## 2021-09-29 NOTE — Progress Notes (Unsigned)
Patient ID: Jermaine Kramer, male    DOB: 1950-07-15, 71 y.o.   MRN: [id number]  HPI  Jermaine Kramer is a 71 y/o male with a history of leukemia, DM, CAD (CABG 02/16/21), AF, sleep apnea, hyperlipidemia, HTN, stroke, GERD & chronic heart failure.   Echo report from 09/16/21 reviewed and showed an EF of 30-35% along with mild Jermaine. Echo report from 05/16/21 reviewed and showed and EF of <20% along with mild/moderate LAE and mild/moderate Jermaine.   Admitted 09/15/21 due to worsening SOB and weight gain due to acute on chronic HF. Given IV lasix with transition to oral diuretics. Cardiology consult obtained. ACEi deferred to renal function. Discharged after 2 days. Admitted 07/24/21 due to  worsening left flank pain today, which is constant, sharp, 8 out of 10 in severity, nonradiating. CT of lumbar spine is negative for acute injury, but showed degenerative disc disease.  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 Urology and GI consults obtained. Discharged after 3 days. Was in the ED 07/21/21 due to left sided flank pain. CT as positive for a kidney stone that is in the left kidney, nonobstructing. He was released with pain medication. AICD implanted 06/22/21. Admitted 05/14/21 due to lower extremity edema, increasing shortness of breath, cough and wheezing. Initially given IV lasix with transition to oral diuretics. Cardiology consult obtained. Discharged after 4 days.   He presents today for a follow-up visit with a chief complaint of moderate fatigue with minimal exertion. Describes this as chronic in nature. Has associated shortness of breath, abdominal distention (improving) and constipation along with this. He denies any difficulty sleeping, dizziness, palpitations, pedal edema, chest pain, wheezing, cough or weight gain.   Received '80mg'$  IV lasix/ 88mq PO potassium 2 days ago with 5 pound weight loss at home, resolution of wheezing and slight improvement of abdominal  distention.   Past Medical History:  Diagnosis Date   Arrhythmia    atrial fibrillation   CHF (congestive heart failure) (HCC)    Coronary artery disease    Diabetes mellitus without complication (HCC)    GERD (gastroesophageal reflux disease)    Hypercholesteremia    Hypertension    Sleep apnea    Stroke ([facility]    Past Surgical History:  Procedure Laterality Date   CHOLECYSTECTOMY     COLONOSCOPY WITH PROPOFOL N/A 04/14/2019   Procedure: COLONOSCOPY WITH PROPOFOL;  Surgeon: Toledo, TBenay Pike MD;  Location: [facility] ENDOSCOPY;  Service: Gastroenterology;  Laterality: N/A;   CORONARY ARTERY BYPASS GRAFT  02/16/2021   ICD IMPLANT N/A 06/22/2021   Procedure: ICD IMPLANT;  Surgeon: LVickie Epley MD;  [facility];  Service: Cardiovascular;  Laterality: N/A;   KNEE ARTHROSCOPY     RENAL CYST EXCISION     Family History  Problem Relation Age of Onset   Parkinson's disease Mother    Lupus Mother    Heart disease Father    Social History   Tobacco Use   Smoking status: Never   Smokeless tobacco: Never  Substance Use Topics   Alcohol use: Not Currently   Allergies  Allergen Reactions   Doxycycline Anaphylaxis    Patient does not recall having this reaction.     Heparin Other (See Comments)    heparin-induced thrombocytopenia    Empagliflozin Itching    Groin infection Caused a groin infection.     Prior to Admission medications   Medication Sig Start Date End Date Taking? Authorizing  Provider  amiodarone (PACERONE) 200 MG tablet Take 200 mg by mouth daily. 03/06/21  Yes [provider]  apixaban (ELIQUIS) 5 MG TABS tablet Take 1 tablet (5 mg total) by mouth 2 (two) times daily. DO NOT RESTART UNTIL 06/27/2021. 06/22/21  Yes Dunn, Areta Haber, PA-C  aspirin EC 81 MG tablet Take 81 mg by mouth daily.   Yes [provider]  atorvastatin (LIPITOR) 80 MG tablet Take 80 mg by mouth daily. 02/10/21  Yes [provider]  furosemide  (LASIX) 40 MG tablet Take 1 tablet (40 mg total) by mouth daily. Patient taking differently: Take 20 mg by mouth daily. 09/17/21  Yes Fritzi Mandes, MD  glucose blood (PRECISION QID TEST) test strip Use 3 (three) times daily Use as instructed. One touch ultra 07/05/21 07/05/22 Yes [provider]  lactulose (CHRONULAC) 10 GM/15ML solution Take 30 mLs (20 g total) by mouth 2 (two) times daily. Patient taking differently: Take 20 g by mouth 2 (two) times daily. Takes as needed 09/17/21  Yes Fritzi Mandes, MD  metFORMIN (GLUCOPHAGE) 1000 MG tablet Take 1,000 mg by mouth 2 (two) times daily with a meal.   Yes [provider]  metoprolol succinate (TOPROL-XL) 25 MG 24 hr tablet Take 12.5 mg by mouth every evening.   Yes [provider]  omeprazole (PRILOSEC) 40 MG capsule Take 40 mg by mouth daily. 03/11/21  Yes [provider]  ondansetron (ZOFRAN) 4 MG tablet Take 4 mg by mouth every 8 (eight) hours as needed. 09/13/21  Yes [provider]  polyethylene glycol (MIRALAX / GLYCOLAX) 17 g packet Take 17 g by mouth daily. Patient taking differently: Take 17 g by mouth daily. Takes as needed 09/17/21  Yes Fritzi Mandes, MD  spironolactone (ALDACTONE) 25 MG tablet Take 12.5 mg by mouth daily. 09/20/21  Yes [provider]  tamsulosin (FLOMAX) 0.4 MG CAPS capsule Take 0.8 mg by mouth in the morning and at bedtime.   Yes [provider]   Review of Systems  Constitutional:  Positive for fatigue (easily). Negative for appetite change.  HENT:  Negative for congestion, postnasal drip and sore throat.   Eyes: Negative.   Respiratory:  Positive for shortness of breath (easily). Negative for cough, chest tightness and wheezing.   Cardiovascular:  Negative for chest pain, palpitations and leg swelling.  Gastrointestinal:  Positive for abdominal distention and constipation. Negative for abdominal pain.  Endocrine: Negative.   Genitourinary: Negative.    Musculoskeletal:  Negative for back pain and neck pain.  Skin: Negative.   Allergic/Immunologic: Negative.   Neurological:  Negative for dizziness and light-headedness.  Hematological:  Negative for adenopathy. Does not bruise/bleed easily.  Psychiatric/Behavioral:  Negative for dysphoric mood and sleep disturbance (sleeping on 2 pillows). The patient is not nervous/anxious.    Vitals:   09/30/21 0910  BP: 118/67  Pulse: 72  Resp: 18  SpO2: 98%  Weight: 187 lb 6 oz (85 kg)  Height: '5\' 7"'$  (1.702 m)   Wt Readings from Last 3 Encounters:  09/30/21 187 lb 6 oz (85 kg)  09/21/21 191 lb 8 oz (86.9 kg)  09/21/21 191 lb (86.6 kg)   Lab Results  Component Value Date   CREATININE 1.89 (H) 09/28/2021   CREATININE 1.53 (H) 09/17/2021   CREATININE 1.53 (H) 09/16/2021   Physical Exam Vitals and nursing note reviewed. Exam conducted with a chaperone present (wife).  Constitutional:      Appearance: Normal appearance.  HENT:  Head: Normocephalic and atraumatic.  Cardiovascular:     Rate and Rhythm: Normal rate and regular rhythm.  Pulmonary:     Effort: Pulmonary effort is normal. No respiratory distress.     Breath sounds: No wheezing or rales.  Abdominal:     General: There is distension.     Tenderness: There is no abdominal tenderness.  Musculoskeletal:        General: No tenderness.     Cervical back: Normal range of motion and neck supple.     Right lower leg: No edema.     Left lower leg: No edema.  Skin:    General: Skin is warm and dry.  Neurological:     General: No focal deficit present.     Mental Status: He is alert and oriented to person, place, and time.  Psychiatric:        Mood and Affect: Mood normal.        Behavior: Behavior normal.        Thought Content: Thought content normal.   Assessment & Plan:  1: Chronic heart failure with reduced ejection fraction- - NYHA class III - euvolemic today - weighing daily and home weight chart reviewed & weight  down 5 pounds in 2 days after (V lasix; reminded to call for an overnight weight gain of > 2 pounds or a weekly weight gain of >5 pounds - weight down 4 pounds from last visit here 1 week ago - not adding "much" salt and has been reading food labels for sodium content; understands to keep daily sodium intake to '2000mg'$  / day - on GDMT of metoprolol and spironolactone; lisinopril has been held due to AKI - stop furosemide and begin torsemide '20mg'$  daily with additional '20mg'$  as needed for above weight gain/ abdominal swelling; will check BMP at next visit with changing of diuretic - check BMP today since received '80mg'$  IV lasix / 73mq PO potassium a couple of days ago - allergic to empagliflozin with a yeast infection - saw EP (Quentin Ore 09/21/21  - had AICD implanted 06/22/21; no shocks have been delivered - discussed having furoscix on hand if needed over a weekend & they are interested in this; patient and wife watched the video in the office and had no questions - demo was shown so they could handle the device and 1 sample box provided - they will call if they have to use the furoscix - BNP 09/28/21 was 1480.2 - PharmD reconciled medications  2: HTN- - BP looks good today (118/67) - saw PCP ([name] 08/03/21 - BMP 09/28/21 reviewed and showed sodium 135, potassium 4.8, creatinine 1.89 & GFR 37  3: DM- - A1c 08/03/21 was 7.3%  4: Atrial fibrillation- - saw cardiology (Corky Sox 09/20/21 - currently on amiodarone, metoprolol and apixaban  5: CAD- - CABG with a LIMA to LAD, SVG to ramus, and SVG to PDA on 02/16/21    Medication bottles reviewed.   Return in 2 weeks, sooner if needed

## 2021-09-30 ENCOUNTER — Other Ambulatory Visit
Admission: RE | Admit: 2021-09-30 | Discharge: 2021-09-30 | Disposition: A | Discharge status: Home / Self Care | Payer: Medicare Other | Source: Ambulatory Visit | Attending: Family | Admitting: Family

## 2021-09-30 ENCOUNTER — Encounter: Payer: Self-pay | Admitting: Family

## 2021-09-30 ENCOUNTER — Ambulatory Visit (HOSPITAL_BASED_OUTPATIENT_CLINIC_OR_DEPARTMENT_OTHER): Payer: Medicare Other | Admitting: Family

## 2021-09-30 VITALS — BP 118/67 | HR 72 | Resp 18 | Ht 67.0 in | Wt 187.4 lb

## 2021-09-30 DIAGNOSIS — I480 Paroxysmal atrial fibrillation: Secondary | ICD-10-CM

## 2021-09-30 DIAGNOSIS — K219 Gastro-esophageal reflux disease without esophagitis: Secondary | ICD-10-CM | POA: Insufficient documentation

## 2021-09-30 DIAGNOSIS — E1122 Type 2 diabetes mellitus with diabetic chronic kidney disease: Secondary | ICD-10-CM

## 2021-09-30 DIAGNOSIS — I5023 Acute on chronic systolic (congestive) heart failure: Secondary | ICD-10-CM | POA: Insufficient documentation

## 2021-09-30 DIAGNOSIS — E7800 Pure hypercholesterolemia, unspecified: Secondary | ICD-10-CM | POA: Insufficient documentation

## 2021-09-30 DIAGNOSIS — Z79899 Other long term (current) drug therapy: Secondary | ICD-10-CM | POA: Insufficient documentation

## 2021-09-30 DIAGNOSIS — N1832 Chronic kidney disease, stage 3b: Secondary | ICD-10-CM

## 2021-09-30 DIAGNOSIS — I2510 Atherosclerotic heart disease of native coronary artery without angina pectoris: Secondary | ICD-10-CM | POA: Diagnosis not present

## 2021-09-30 DIAGNOSIS — I255 Ischemic cardiomyopathy: Secondary | ICD-10-CM | POA: Diagnosis not present

## 2021-09-30 DIAGNOSIS — G4730 Sleep apnea, unspecified: Secondary | ICD-10-CM | POA: Insufficient documentation

## 2021-09-30 DIAGNOSIS — I110 Hypertensive heart disease with heart failure: Secondary | ICD-10-CM | POA: Insufficient documentation

## 2021-09-30 DIAGNOSIS — N2889 Other specified disorders of kidney and ureter: Secondary | ICD-10-CM | POA: Insufficient documentation

## 2021-09-30 DIAGNOSIS — I10 Essential (primary) hypertension: Secondary | ICD-10-CM

## 2021-09-30 DIAGNOSIS — Z95810 Presence of automatic (implantable) cardiac defibrillator: Secondary | ICD-10-CM | POA: Insufficient documentation

## 2021-09-30 DIAGNOSIS — Z856 Personal history of leukemia: Secondary | ICD-10-CM | POA: Insufficient documentation

## 2021-09-30 DIAGNOSIS — I5022 Chronic systolic (congestive) heart failure: Secondary | ICD-10-CM | POA: Insufficient documentation

## 2021-09-30 DIAGNOSIS — E119 Type 2 diabetes mellitus without complications: Secondary | ICD-10-CM | POA: Insufficient documentation

## 2021-09-30 DIAGNOSIS — Z7901 Long term (current) use of anticoagulants: Secondary | ICD-10-CM | POA: Insufficient documentation

## 2021-09-30 DIAGNOSIS — Z8673 Personal history of transient ischemic attack (TIA), and cerebral infarction without residual deficits: Secondary | ICD-10-CM | POA: Insufficient documentation

## 2021-09-30 DIAGNOSIS — I4891 Unspecified atrial fibrillation: Secondary | ICD-10-CM | POA: Insufficient documentation

## 2021-09-30 DIAGNOSIS — Z951 Presence of aortocoronary bypass graft: Secondary | ICD-10-CM | POA: Insufficient documentation

## 2021-09-30 DIAGNOSIS — Z7984 Long term (current) use of oral hypoglycemic drugs: Secondary | ICD-10-CM | POA: Insufficient documentation

## 2021-09-30 DIAGNOSIS — Z7982 Long term (current) use of aspirin: Secondary | ICD-10-CM | POA: Insufficient documentation

## 2021-09-30 LAB — BASIC METABOLIC PANEL
Anion gap: 9 (ref 5–15)
BUN: 28 mg/dL — ABNORMAL HIGH (ref 8–23)
CO2: 24 mmol/L (ref 22–32)
Calcium: 9.1 mg/dL (ref 8.9–10.3)
Chloride: 107 mmol/L (ref 98–111)
Creatinine, Ser: 1.81 mg/dL — ABNORMAL HIGH (ref 0.61–1.24)
GFR, Estimated: 39 mL/min — ABNORMAL LOW (ref >60)
Glucose, Bld: 116 mg/dL — ABNORMAL HIGH (ref 70–99)
Potassium: 4.6 mmol/L (ref 3.5–5.1)
Sodium: 140 mmol/L (ref 135–145)

## 2021-09-30 MED ORDER — FUROSCIX 80 MG/10ML ~~LOC~~ CTKT: 1.0000 | CARTRIDGE | SUBCUTANEOUS | 0 refills | Status: DC | PRN

## 2021-09-30 MED ORDER — TORSEMIDE 20 MG PO TABS
20.0000 mg | ORAL_TABLET | Freq: Every day | ORAL | 3 refills | Status: DC
Start: 2021-09-30 — End: 2022-02-21

## 2021-09-30 NOTE — Patient Instructions (Addendum)
Continue weighing daily and call for an overnight weight gain of 3 pounds or more or a weekly weight gain of more than 5 pounds.  If you have voicemail, please make sure your mailbox is cleaned out so that we may leave a message and please make sure to listen to any voicemails.   Stop taking furosemide and begin torsemide '20mg'$  daily and take an additional '20mg'$  if needed for above weight gain or worsening abdominal swelling

## 2021-09-30 NOTE — Progress Notes (Signed)
[place] - PHARMACIST COUNSELING NOTE  Guideline-Directed Medical Therapy/Evidence Based Medicine  ACE/ARB/ARNI: None Beta Blocker: Metoprolol succinate 12.5 mg daily Aldosterone Antagonist: Spironolactone 12.5 mg daily Diuretic: Torsemide 20 mg daily SGLT2i: None  Adherence Assessment  Do you ever forget to take your medication? '[]'$ Yes '[x]'$ No  Do you ever skip doses due to side effects? '[]'$ Yes '[x]'$ No  Do you have trouble affording your medicines? '[]'$ Yes '[x]'$ No  Are you ever unable to pick up your medication due to transportation difficulties? '[]'$ Yes '[x]'$ No  Do you ever stop taking your medications because you don't believe they are helping? '[]'$ Yes '[x]'$ No  Do you check your weight daily? '[x]'$ Yes '[]'$ No   Adherence strategy: wife manages medications for patient  Barriers to obtaining medications: none  Vital signs: HR 72, BP 118/67, weight (pounds) 187 lb ECHO: Date 09/16/21, EF 30-35%, notes grade III diastolic dysfunction     Latest Ref Rng & Units 09/28/2021    1:04 PM 09/17/2021    5:08 AM 09/16/2021    5:57 AM  BMP  Glucose 70 - 99 mg/dL 187   119   115    BUN 8 - 23 mg/dL '27   21   26    '$ Creatinine 0.61 - 1.24 mg/dL 1.89   1.53   1.53    Sodium 135 - 145 mmol/L 135   138   138    Potassium 3.5 - 5.1 mmol/L 4.8   3.5   3.9    Chloride 98 - 111 mmol/L 103   101   103    CO2 22 - 32 mmol/L '21   29   25    '$ Calcium 8.9 - 10.3 mg/dL 8.8   8.2   8.4      Past Medical History:  Diagnosis Date   Arrhythmia    atrial fibrillation   CHF (congestive heart failure) (HCC)    Coronary artery disease    Diabetes mellitus without complication (HCC)    GERD (gastroesophageal reflux disease)    Hypercholesteremia    Hypertension    Sleep apnea    Stroke [facility])     ASSESSMENT 71 year old male who presents to the HF clinic for follow up of HFrEF. Presented with nausea last week. Per wife, patient typically holds fluid in abdomen. Received  dose of IV Lasix without improvement. Back in clinic today with persistent nausea 2/2 fluid overload.  Recent ED Visit (past 6 months): Date - 09/15/21, [facility] - acute on chronic CHF  PLAN Receiving Furoscix 80 mg today. Consider switching furosemide to a more potent diuretic such as torsemide  Monitor renal function via BMP  Time spent: 10 minutes  Wynelle Cleveland, PharmD Pharmacy Resident  09/30/2021 9:21 AM  Current Outpatient Medications:    amiodarone (PACERONE) 200 MG tablet, Take 200 mg by mouth daily., Disp: , Rfl:    apixaban (ELIQUIS) 5 MG TABS tablet, Take 1 tablet (5 mg total) by mouth 2 (two) times daily. DO NOT RESTART UNTIL 06/27/2021., Disp: 60 tablet, Rfl:    aspirin EC 81 MG tablet, Take 81 mg by mouth daily., Disp: , Rfl:    atorvastatin (LIPITOR) 80 MG tablet, Take 80 mg by mouth daily., Disp: , Rfl:    furosemide (LASIX) 40 MG tablet, Take 1 tablet (40 mg total) by mouth daily. (Patient taking differently: Take 20 mg by mouth daily.), Disp: 30 tablet, Rfl: 1   glucose blood (PRECISION QID TEST) test strip, Use  3 (three) times daily Use as instructed. One touch ultra, Disp: , Rfl:    lactulose (CHRONULAC) 10 GM/15ML solution, Take 30 mLs (20 g total) by mouth 2 (two) times daily. (Patient taking differently: Take 20 g by mouth 2 (two) times daily. Takes as needed), Disp: 236 mL, Rfl: 0   metFORMIN (GLUCOPHAGE) 1000 MG tablet, Take 1,000 mg by mouth 2 (two) times daily with a meal., Disp: , Rfl:    metoprolol succinate (TOPROL-XL) 25 MG 24 hr tablet, Take 12.5 mg by mouth every evening., Disp: , Rfl:    omeprazole (PRILOSEC) 40 MG capsule, Take 40 mg by mouth daily., Disp: , Rfl:    ondansetron (ZOFRAN) 4 MG tablet, Take 4 mg by mouth every 8 (eight) hours as needed., Disp: , Rfl:    polyethylene glycol (MIRALAX / GLYCOLAX) 17 g packet, Take 17 g by mouth daily. (Patient taking differently: Take 17 g by mouth daily. Takes as needed), Disp: 14 each, Rfl: 0   spironolactone  (ALDACTONE) 25 MG tablet, Take 12.5 mg by mouth daily., Disp: , Rfl:    tamsulosin (FLOMAX) 0.4 MG CAPS capsule, Take 0.8 mg by mouth in the morning and at bedtime., Disp: , Rfl:    COUNSELING POINTS/CLINICAL PEARLS   DRUGS TO CAUTION IN HEART FAILURE  Drug or Class Mechanism  Analgesics NSAIDs COX-2 inhibitors Glucocorticoids  Sodium and water retention, increased systemic vascular resistance, decreased response to diuretics   Diabetes Medications Metformin Thiazolidinediones Rosiglitazone (Avandia) Pioglitazone (Actos) DPP4 Inhibitors Saxagliptin (Onglyza) Sitagliptin (Januvia)   Lactic acidosis Possible calcium channel blockade   Unknown  Antiarrhythmics Class I  Flecainide Disopyramide Class III Sotalol Other Dronedarone  Negative inotrope, proarrhythmic   Proarrhythmic, beta blockade  Negative inotrope  Antihypertensives Alpha Blockers Doxazosin Calcium Channel Blockers Diltiazem Verapamil Nifedipine Central Alpha Adrenergics Moxonidine Peripheral Vasodilators Minoxidil  Increases renin and aldosterone  Negative inotrope    Possible sympathetic withdrawal  Unknown  Anti-infective Itraconazole Amphotericin B  Negative inotrope Unknown  Hematologic Anagrelide Cilostazol   Possible inhibition of PD IV Inhibition of PD III causing arrhythmias  Neurologic/Psychiatric Stimulants Anti-Seizure Drugs Carbamazepine Pregabalin Antidepressants Tricyclics Citalopram Parkinsons Bromocriptine Pergolide Pramipexole Antipsychotics Clozapine Antimigraine Ergotamine Methysergide Appetite suppressants Bipolar Lithium  Peripheral alpha and beta agonist activity  Negative inotrope and chronotrope Calcium channel blockade  Negative inotrope, proarrhythmic Dose-dependent QT prolongation  Excessive serotonin activity/valvular damage Excessive serotonin activity/valvular damage Unknown  IgE mediated hypersensitivy, calcium channel  blockade  Excessive serotonin activity/valvular damage Excessive serotonin activity/valvular damage Valvular damage  Direct myofibrillar degeneration, adrenergic stimulation  Antimalarials Chloroquine Hydroxychloroquine Intracellular inhibition of lysosomal enzymes  Urologic Agents Alpha Blockers Doxazosin Prazosin Tamsulosin Terazosin  Increased renin and aldosterone  Adapted from Page Carleene Overlie, et al. "Drugs That May Cause or Exacerbate Heart Failure: A Scientific Statement from the American Heart  Association." Circulation 2016; 134:e32-e69. DOI: 10.1161/CIR.0000000000000426   MEDICATION ADHERENCES TIPS AND STRATEGIES Taking medication as prescribed improves patient outcomes in heart failure (reduces hospitalizations, improves symptoms, increases survival) Side effects of medications can be managed by decreasing doses, switching agents, stopping drugs, or adding additional therapy. Please let someone in the [facility] know if you have having bothersome side effects so we can modify your regimen. Do not alter your medication regimen without talking to [place].  Medication reminders can help patients remember to take drugs on time. If you are missing or forgetting doses you can try linking behaviors, using pill boxes, or an electronic reminder like an alarm on your phone  or an app. Some people can also get automated phone calls as medication reminders.

## 2021-10-03 ENCOUNTER — Other Ambulatory Visit: Payer: Self-pay

## 2021-10-03 ENCOUNTER — Emergency Department
Admission: EM | Admit: 2021-10-03 | Discharge: 2021-10-03 | Disposition: A | Discharge status: Home / Self Care | Payer: Medicare Other | Source: Home / Self Care | Attending: Student in an Organized Health Care Education/Training Program | Admitting: Student in an Organized Health Care Education/Training Program

## 2021-10-03 ENCOUNTER — Encounter: Payer: Self-pay | Admitting: *Deleted

## 2021-10-03 ENCOUNTER — Emergency Department: Payer: Medicare Other

## 2021-10-03 DIAGNOSIS — R531 Weakness: Secondary | ICD-10-CM | POA: Diagnosis not present

## 2021-10-03 DIAGNOSIS — A080 Rotaviral enteritis: Secondary | ICD-10-CM | POA: Insufficient documentation

## 2021-10-03 DIAGNOSIS — A09 Infectious gastroenteritis and colitis, unspecified: Secondary | ICD-10-CM

## 2021-10-03 DIAGNOSIS — Z951 Presence of aortocoronary bypass graft: Secondary | ICD-10-CM

## 2021-10-03 LAB — URINALYSIS, ROUTINE W REFLEX MICROSCOPIC
Bilirubin Urine: NEGATIVE
Glucose, UA: NEGATIVE mg/dL
Ketones, ur: 5 mg/dL — AB
Leukocytes,Ua: NEGATIVE
Nitrite: NEGATIVE
Protein, ur: 100 mg/dL — AB
Specific Gravity, Urine: 1.024 (ref 1.005–1.030)
pH: 5 (ref 5.0–8.0)

## 2021-10-03 LAB — COMPREHENSIVE METABOLIC PANEL
ALT: 45 U/L — ABNORMAL HIGH (ref 0–44)
AST: 58 U/L — ABNORMAL HIGH (ref 15–41)
Albumin: 4.2 g/dL (ref 3.5–5.0)
Alkaline Phosphatase: 70 U/L (ref 38–126)
Anion gap: 10 (ref 5–15)
BUN: 27 mg/dL — ABNORMAL HIGH (ref 8–23)
CO2: 20 mmol/L — ABNORMAL LOW (ref 22–32)
Calcium: 8.9 mg/dL (ref 8.9–10.3)
Chloride: 107 mmol/L (ref 98–111)
Creatinine, Ser: 1.61 mg/dL — ABNORMAL HIGH (ref 0.61–1.24)
GFR, Estimated: 45 mL/min — ABNORMAL LOW (ref >60)
Glucose, Bld: 124 mg/dL — ABNORMAL HIGH (ref 70–99)
Potassium: 3.7 mmol/L (ref 3.5–5.1)
Sodium: 137 mmol/L (ref 135–145)
Total Bilirubin: 1.1 mg/dL (ref 0.3–1.2)
Total Protein: 7.7 g/dL (ref 6.5–8.1)

## 2021-10-03 LAB — GASTROINTESTINAL PANEL BY PCR, STOOL (REPLACES STOOL CULTURE)

## 2021-10-03 LAB — CBC
HCT: 47.4 % (ref 39.0–52.0)
Hemoglobin: 15.7 g/dL (ref 13.0–17.0)
MCH: 30.5 pg (ref 26.0–34.0)
MCHC: 33.1 g/dL (ref 30.0–36.0)
MCV: 92 fL (ref 80.0–100.0)
Platelets: 197 10*3/uL (ref 150–400)
RBC: 5.15 MIL/uL (ref 4.22–5.81)
RDW: 12.8 % (ref 11.5–15.5)
WBC: 8.7 10*3/uL (ref 4.0–10.5)
nRBC: 0 % (ref 0.0–0.2)

## 2021-10-03 LAB — C DIFFICILE QUICK SCREEN W PCR REFLEX
C Diff antigen: NEGATIVE
C Diff interpretation: NOT DETECTED
C Diff toxin: NEGATIVE

## 2021-10-03 LAB — LIPASE, BLOOD: Lipase: 35 U/L (ref 11–51)

## 2021-10-03 MED ORDER — IOHEXOL 300 MG/ML  SOLN
100.0000 mL | Freq: Once | INTRAMUSCULAR | Status: AC | PRN
Start: 2021-10-03 — End: 2021-10-03
  Administered 2021-10-03: 80 mL via INTRAVENOUS

## 2021-10-03 MED ORDER — SODIUM CHLORIDE 0.9 % IV BOLUS
500.0000 mL | Freq: Once | INTRAVENOUS | Status: AC
  Administered 2021-10-03: 500 mL via INTRAVENOUS

## 2021-10-03 MED ORDER — SODIUM CHLORIDE 0.9 % IV BOLUS
1000.0000 mL | Freq: Once | INTRAVENOUS | Status: AC
  Administered 2021-10-03: 1000 mL via INTRAVENOUS

## 2021-10-03 MED ORDER — ONDANSETRON HCL 4 MG/2ML IJ SOLN
4.0000 mg | Freq: Once | INTRAMUSCULAR | Status: AC | PRN
  Administered 2021-10-03: 4 mg via INTRAVENOUS
  Filled 2021-10-03: qty 2

## 2021-10-03 NOTE — ED Notes (Signed)
ICE CHIPS GIVEN.

## 2021-10-03 NOTE — ED Triage Notes (Addendum)
Pt c/o generalized abd pain with N/V/D since Thursday, went to GI and was sent here, states he has not been able to take any of his medications because he is not able to keep anything down. States he has lost 21lbs since thursday

## 2021-10-03 NOTE — ED Provider Notes (Signed)
[facility] Provider Note    Event Date/Time   First MD Initiated Contact with Patient 10/03/21 1459     (approximate)   History   Abdominal Pain   HPI  Jermaine Kramer is a 71 y.o. male status post bypass surgery on anticoagulation presents to the ER for evaluation of 24 to 48 hours of nausea vomiting diarrhea.  Stool is "soupy.  ".  Granddaughter sick with gastroenteritis over the weekend.  He is complaining of achy right lower quadrant pain.  Having trouble keeping any of his medications down.  Having frequent belching.  Describes the pain as achy and crampy in nature.  Does not recall being diagnosed with diverticulitis in the past.     Physical Exam   Triage Vital Signs: ED Triage Vitals  Enc Vitals Group     BP 10/03/21 1348 121/82     Pulse Rate 10/03/21 1348 90     Resp 10/03/21 1348 17     Temp 10/03/21 1351 (!) 97.3 F (36.3 C)     Temp Source 10/03/21 1348 Oral     SpO2 10/03/21 1348 94 %     Weight 10/03/21 1348 171 lb (77.6 kg)     Height 10/03/21 1348 '5\' 7"'$  (1.702 m)     Head Circumference --      Peak Flow --      Pain Score --      Pain Loc --      Pain Edu? --      Excl. in [place]? --     Most recent vital signs: Vitals:   10/03/21 1351 10/03/21 1655  BP:  120/80  Pulse:  88  Resp:  18  Temp: (!) 97.3 F (36.3 C)   SpO2:  97%     Constitutional: Alert  Eyes: Conjunctivae are normal.  Head: Atraumatic. Nose: No congestion/rhinnorhea. Mouth/Throat: Mucous membranes are moist.   Neck: Painless ROM.  Cardiovascular:   Good peripheral circulation. No m/g/r Respiratory: Normal respiratory effort.  No retractions. No crackles Gastrointestinal: Soft with mild ttp in rlq Musculoskeletal:  no deformity Neurologic:  MAE spontaneously. No gross focal neurologic deficits are appreciated.  Skin:  Skin is warm, dry and intact. No rash noted. Psychiatric: Mood and affect are normal. Speech and behavior are normal.    ED Results  / Procedures / Treatments   Labs (all labs ordered are listed, but only abnormal results are displayed) Labs Reviewed  GASTROINTESTINAL PANEL BY PCR, STOOL (REPLACES STOOL CULTURE) - Abnormal; Notable for the following components:      Result Value   Rotavirus A DETECTED (*)    All other components within normal limits  COMPREHENSIVE METABOLIC PANEL - Abnormal; Notable for the following components:   CO2 20 (*)    Glucose, Bld 124 (*)    BUN 27 (*)    Creatinine, Ser 1.61 (*)    AST 58 (*)    ALT 45 (*)    GFR, Estimated 45 (*)    All other components within normal limits  URINALYSIS, ROUTINE W REFLEX MICROSCOPIC - Abnormal; Notable for the following components:   Color, Urine YELLOW (*)    APPearance HAZY (*)    Hgb urine dipstick SMALL (*)    Ketones, ur 5 (*)    Protein, ur 100 (*)    Bacteria, UA RARE (*)    All other components within normal limits  C DIFFICILE QUICK SCREEN W PCR REFLEX    LIPASE,  BLOOD  CBC     EKG  ED ECG REPORT I, Merlyn Lot, the attending physician, personally viewed and interpreted this ECG.   Date: 10/03/2021  EKG Time: 14:04  Rate: 90  Rhythm: sinus  Axis: right  Intervals:rbbb  ST&T Change: no stemi, no depressions    RADIOLOGY Please see ED Course for my review and interpretation.  I personally reviewed all radiographic images ordered to evaluate for the above acute complaints and reviewed radiology reports and findings.  These findings were personally discussed with the patient.  Please see medical record for radiology report.    PROCEDURES:  Critical Care performed: No  Procedures   MEDICATIONS ORDERED IN ED: Medications  ondansetron (ZOFRAN) injection 4 mg (4 mg Intravenous Given 10/03/21 1404)  sodium chloride 0.9 % bolus 500 mL (0 mLs Intravenous Stopped 10/03/21 1605)  sodium chloride 0.9 % bolus 1,000 mL (1,000 mLs Intravenous New Bag/Given 10/03/21 1609)  iohexol (OMNIPAQUE) 300 MG/ML solution 100 mL (80 mLs  Intravenous Contrast Given 10/03/21 1526)     IMPRESSION / MDM / ASSESSMENT AND PLAN / ED COURSE  I reviewed the triage vital signs and the nursing notes.                              Differential diagnosis includes, but is not limited to, enteritis, gastritis, diverticulitis, appendicitis, dehydration, electrolyte abnormality, SBO  Patient presenting to the ER for evaluation of abdominal pain nausea vomiting diarrhea as described above.  Patient's presentation is more complicated given his recent bypass and cardiac history.  Reported significant weight loss secondary to poor p.o. intake with volume loss from GI illness.  We will give IV fluids.  He does not appear to be in acute heart failure.  We will give antiemetics.  Given his abdominal pain will order CT imaging to evaluate for the above differential.   Clinical Course as of 10/03/21 1921  Mon Oct 03, 2021  1604 CT imaging on my interpretation without signs of bowel obstruction. [PR]  1646 Patient reassessed.  Repeat abdominal exam soft and benign.  Discussed CT imaging results with patient.  He is feeling improved after IV fluids.  No sign of heart failure.  Will trial p.o. [PR]  1823 Patient tolerating p.o.  Feels improved. [PR]  1918 Patient feels significantly improved.  Did test positive for rotavirus.  Has only had 1 bowel movement this afternoon feels like his symptoms are improving.  Given his age and risk factors we just discussed option for hospitalization for symptomatic management and IV fluids.  Patient states that he feels well at this point has nausea medications at home and would like to be discharged.  Given his otherwise reassuring work-up I think that is reasonable.  He has been able to tolerate his home medications.  We discussed strict return precautions and that he should return if his symptoms fail to improve or worsen.  He agrees to plan. [PR]    Clinical Course User Index [PR] Merlyn Lot, MD      FINAL  CLINICAL IMPRESSION(S) / ED DIAGNOSES   Final diagnoses:  Diarrhea of infectious origin  Rotavirus enteritis     Rx / DC Orders   ED Discharge Orders     None        Note:  This document was prepared using Dragon voice recognition software and may include unintentional dictation errors.    Merlyn Lot, MD 10/03/21 Dorthula Perfect

## 2021-10-04 ENCOUNTER — Telehealth: Payer: Self-pay | Admitting: Family

## 2021-10-04 ENCOUNTER — Other Ambulatory Visit: Payer: Self-pay

## 2021-10-04 ENCOUNTER — Inpatient Hospital Stay
Admission: EM | Admit: 2021-10-04 | Discharge: 2021-10-07 | DRG: 391 | Disposition: A | Discharge status: Home / Self Care | Payer: Medicare Other | Attending: Internal Medicine | Admitting: Internal Medicine

## 2021-10-04 DIAGNOSIS — Z8673 Personal history of transient ischemic attack (TIA), and cerebral infarction without residual deficits: Secondary | ICD-10-CM

## 2021-10-04 DIAGNOSIS — Z95810 Presence of automatic (implantable) cardiac defibrillator: Secondary | ICD-10-CM

## 2021-10-04 DIAGNOSIS — Z881 Allergy status to other antibiotic agents status: Secondary | ICD-10-CM

## 2021-10-04 DIAGNOSIS — I248 Other forms of acute ischemic heart disease: Secondary | ICD-10-CM | POA: Diagnosis present

## 2021-10-04 DIAGNOSIS — I959 Hypotension, unspecified: Secondary | ICD-10-CM

## 2021-10-04 DIAGNOSIS — Z9049 Acquired absence of other specified parts of digestive tract: Secondary | ICD-10-CM

## 2021-10-04 DIAGNOSIS — E861 Hypovolemia: Secondary | ICD-10-CM | POA: Diagnosis present

## 2021-10-04 DIAGNOSIS — E872 Acidosis, unspecified: Secondary | ICD-10-CM | POA: Diagnosis present

## 2021-10-04 DIAGNOSIS — Z7984 Long term (current) use of oral hypoglycemic drugs: Secondary | ICD-10-CM

## 2021-10-04 DIAGNOSIS — I482 Chronic atrial fibrillation, unspecified: Secondary | ICD-10-CM | POA: Diagnosis present

## 2021-10-04 DIAGNOSIS — K219 Gastro-esophageal reflux disease without esophagitis: Secondary | ICD-10-CM | POA: Diagnosis present

## 2021-10-04 DIAGNOSIS — E7800 Pure hypercholesterolemia, unspecified: Secondary | ICD-10-CM | POA: Diagnosis present

## 2021-10-04 DIAGNOSIS — E860 Dehydration: Secondary | ICD-10-CM | POA: Diagnosis present

## 2021-10-04 DIAGNOSIS — A080 Rotaviral enteritis: Principal | ICD-10-CM | POA: Diagnosis present

## 2021-10-04 DIAGNOSIS — R0902 Hypoxemia: Secondary | ICD-10-CM | POA: Diagnosis present

## 2021-10-04 DIAGNOSIS — Z951 Presence of aortocoronary bypass graft: Secondary | ICD-10-CM

## 2021-10-04 DIAGNOSIS — Z79899 Other long term (current) drug therapy: Secondary | ICD-10-CM

## 2021-10-04 DIAGNOSIS — E876 Hypokalemia: Secondary | ICD-10-CM

## 2021-10-04 DIAGNOSIS — Z888 Allergy status to other drugs, medicaments and biological substances status: Secondary | ICD-10-CM

## 2021-10-04 DIAGNOSIS — R531 Weakness: Principal | ICD-10-CM

## 2021-10-04 DIAGNOSIS — Z7901 Long term (current) use of anticoagulants: Secondary | ICD-10-CM

## 2021-10-04 DIAGNOSIS — I9589 Other hypotension: Secondary | ICD-10-CM | POA: Diagnosis present

## 2021-10-04 DIAGNOSIS — K7200 Acute and subacute hepatic failure without coma: Secondary | ICD-10-CM | POA: Diagnosis present

## 2021-10-04 DIAGNOSIS — I5020 Unspecified systolic (congestive) heart failure: Secondary | ICD-10-CM | POA: Diagnosis present

## 2021-10-04 DIAGNOSIS — I452 Bifascicular block: Secondary | ICD-10-CM | POA: Diagnosis present

## 2021-10-04 DIAGNOSIS — I5022 Chronic systolic (congestive) heart failure: Secondary | ICD-10-CM | POA: Diagnosis present

## 2021-10-04 DIAGNOSIS — K529 Noninfective gastroenteritis and colitis, unspecified: Secondary | ICD-10-CM

## 2021-10-04 DIAGNOSIS — A084 Viral intestinal infection, unspecified: Secondary | ICD-10-CM | POA: Diagnosis present

## 2021-10-04 DIAGNOSIS — I2510 Atherosclerotic heart disease of native coronary artery without angina pectoris: Secondary | ICD-10-CM | POA: Diagnosis present

## 2021-10-04 DIAGNOSIS — I493 Ventricular premature depolarization: Secondary | ICD-10-CM | POA: Diagnosis present

## 2021-10-04 DIAGNOSIS — E119 Type 2 diabetes mellitus without complications: Secondary | ICD-10-CM

## 2021-10-04 DIAGNOSIS — N179 Acute kidney failure, unspecified: Secondary | ICD-10-CM

## 2021-10-04 DIAGNOSIS — I110 Hypertensive heart disease with heart failure: Secondary | ICD-10-CM | POA: Diagnosis present

## 2021-10-04 DIAGNOSIS — Z7982 Long term (current) use of aspirin: Secondary | ICD-10-CM

## 2021-10-04 DIAGNOSIS — Z8249 Family history of ischemic heart disease and other diseases of the circulatory system: Secondary | ICD-10-CM

## 2021-10-04 DIAGNOSIS — G4730 Sleep apnea, unspecified: Secondary | ICD-10-CM | POA: Diagnosis present

## 2021-10-04 LAB — COMPREHENSIVE METABOLIC PANEL
ALT: 52 U/L — ABNORMAL HIGH (ref 0–44)
AST: 64 U/L — ABNORMAL HIGH (ref 15–41)
Albumin: 3.7 g/dL (ref 3.5–5.0)
Alkaline Phosphatase: 62 U/L (ref 38–126)
Anion gap: 9 (ref 5–15)
BUN: 24 mg/dL — ABNORMAL HIGH (ref 8–23)
CO2: 19 mmol/L — ABNORMAL LOW (ref 22–32)
Calcium: 8.2 mg/dL — ABNORMAL LOW (ref 8.9–10.3)
Chloride: 107 mmol/L (ref 98–111)
Creatinine, Ser: 1.58 mg/dL — ABNORMAL HIGH (ref 0.61–1.24)
GFR, Estimated: 46 mL/min — ABNORMAL LOW (ref >60)
Glucose, Bld: 172 mg/dL — ABNORMAL HIGH (ref 70–99)
Potassium: 3.4 mmol/L — ABNORMAL LOW (ref 3.5–5.1)
Sodium: 135 mmol/L (ref 135–145)
Total Bilirubin: 0.7 mg/dL (ref 0.3–1.2)
Total Protein: 7 g/dL (ref 6.5–8.1)

## 2021-10-04 LAB — CBC WITH DIFFERENTIAL/PLATELET
Abs Immature Granulocytes: 0.03 10*3/uL (ref 0.00–0.07)
Basophils Absolute: 0 10*3/uL (ref 0.0–0.1)
Basophils Relative: 0 %
Eosinophils Absolute: 0 10*3/uL (ref 0.0–0.5)
Eosinophils Relative: 0 %
HCT: 44.9 % (ref 39.0–52.0)
Hemoglobin: 14.6 g/dL (ref 13.0–17.0)
Immature Granulocytes: 0 %
Lymphocytes Relative: 10 %
Lymphs Abs: 0.9 10*3/uL (ref 0.7–4.0)
MCH: 30 pg (ref 26.0–34.0)
MCHC: 32.5 g/dL (ref 30.0–36.0)
MCV: 92.2 fL (ref 80.0–100.0)
Monocytes Absolute: 0.8 10*3/uL (ref 0.1–1.0)
Monocytes Relative: 9 %
Neutro Abs: 6.9 10*3/uL (ref 1.7–7.7)
Neutrophils Relative %: 81 %
Platelets: 176 10*3/uL (ref 150–400)
RBC: 4.87 MIL/uL (ref 4.22–5.81)
RDW: 12.9 % (ref 11.5–15.5)
WBC: 8.6 10*3/uL (ref 4.0–10.5)
nRBC: 0 % (ref 0.0–0.2)

## 2021-10-04 LAB — CBG MONITORING, ED: Glucose-Capillary: 134 mg/dL — ABNORMAL HIGH (ref 70–99)

## 2021-10-04 LAB — TROPONIN I (HIGH SENSITIVITY)
Troponin I (High Sensitivity): 51 ng/L — ABNORMAL HIGH (ref <18)
Troponin I (High Sensitivity): 40 ng/L — ABNORMAL HIGH (ref <18)

## 2021-10-04 MED ORDER — SODIUM CHLORIDE 0.9 % IV BOLUS
1000.0000 mL | Freq: Once | INTRAVENOUS | Status: AC
  Administered 2021-10-04: 1000 mL via INTRAVENOUS

## 2021-10-04 MED ORDER — LACTATED RINGERS IV SOLN
INTRAVENOUS | Status: AC
Start: 2021-10-04 — End: 2021-10-05
  Administered 2021-10-05: 1000 mL via INTRAVENOUS

## 2021-10-04 MED ORDER — ASPIRIN 81 MG PO TBEC
81.0000 mg | DELAYED_RELEASE_TABLET | Freq: Every day | ORAL | Status: DC
  Administered 2021-10-05 – 2021-10-07 (×3): 81 mg via ORAL
  Filled 2021-10-04 (×3): qty 1

## 2021-10-04 MED ORDER — LACTATED RINGERS IV BOLUS
1000.0000 mL | Freq: Once | INTRAVENOUS | Status: AC
  Administered 2021-10-04: 1000 mL via INTRAVENOUS

## 2021-10-04 MED ORDER — ONDANSETRON HCL 4 MG/2ML IJ SOLN: 4.0000 mg | Freq: Four times a day (QID) | INTRAMUSCULAR | Status: DC | PRN

## 2021-10-04 MED ORDER — INSULIN ASPART 100 UNIT/ML IJ SOLN
0.0000 [IU] | Freq: Three times a day (TID) | INTRAMUSCULAR | Status: DC
  Administered 2021-10-06 – 2021-10-07 (×4): 2 [IU] via SUBCUTANEOUS
  Filled 2021-10-04: qty 1

## 2021-10-04 MED ORDER — APIXABAN 5 MG PO TABS
5.0000 mg | ORAL_TABLET | Freq: Two times a day (BID) | ORAL | Status: DC
  Administered 2021-10-04 – 2021-10-07 (×6): 5 mg via ORAL
  Filled 2021-10-04 (×6): qty 1

## 2021-10-04 MED ORDER — ONDANSETRON HCL 4 MG/2ML IJ SOLN
4.0000 mg | Freq: Once | INTRAMUSCULAR | Status: AC
  Administered 2021-10-04: 4 mg via INTRAVENOUS
  Filled 2021-10-04: qty 2

## 2021-10-04 MED ORDER — ACETAMINOPHEN 325 MG PO TABS: 650.0000 mg | ORAL_TABLET | Freq: Four times a day (QID) | ORAL | Status: DC | PRN

## 2021-10-04 MED ORDER — ACETAMINOPHEN 325 MG RE SUPP: 650.0000 mg | Freq: Four times a day (QID) | RECTAL | Status: DC | PRN

## 2021-10-04 MED ORDER — ATORVASTATIN CALCIUM 20 MG PO TABS: 80.0000 mg | ORAL_TABLET | Freq: Every day | ORAL | Status: DC

## 2021-10-04 MED ORDER — AMIODARONE HCL 200 MG PO TABS
200.0000 mg | ORAL_TABLET | Freq: Every day | ORAL | Status: DC
  Administered 2021-10-05 – 2021-10-07 (×3): 200 mg via ORAL
  Filled 2021-10-04 (×3): qty 1

## 2021-10-04 MED ORDER — INSULIN ASPART 100 UNIT/ML IJ SOLN: 0.0000 [IU] | Freq: Every day | INTRAMUSCULAR | Status: DC

## 2021-10-04 MED ORDER — ONDANSETRON HCL 4 MG PO TABS: 4.0000 mg | ORAL_TABLET | Freq: Four times a day (QID) | ORAL | Status: DC | PRN

## 2021-10-04 MED ORDER — METFORMIN HCL 500 MG PO TABS
1000.0000 mg | ORAL_TABLET | Freq: Two times a day (BID) | ORAL | Status: DC
  Administered 2021-10-05: 1000 mg via ORAL
  Filled 2021-10-04: qty 2

## 2021-10-04 MED ORDER — TAMSULOSIN HCL 0.4 MG PO CAPS
0.8000 mg | ORAL_CAPSULE | Freq: Every day | ORAL | Status: DC
  Administered 2021-10-05 – 2021-10-06 (×2): 0.8 mg via ORAL
  Filled 2021-10-04 (×2): qty 2

## 2021-10-04 NOTE — Assessment & Plan Note (Signed)
Clear liquid diet to advance as tolerated IV hydration, IV antiemetics and supportive care

## 2021-10-04 NOTE — ED Notes (Signed)
Pt desat in to the 70's on room air while asleep. O2/3L via nasal cannula placed on pt.

## 2021-10-04 NOTE — Assessment & Plan Note (Signed)
Oral repletion and monitor 

## 2021-10-04 NOTE — Assessment & Plan Note (Signed)
IV hydration.  Hold antihypertensives and resume as appropriate.  Monitor for fluid overload Daily weights with intake and output monitoring

## 2021-10-04 NOTE — Assessment & Plan Note (Signed)
Continue amiodarone and apixaban 

## 2021-10-04 NOTE — H&P (Signed)
History and Physical    Patient: Jermaine Kramer BPZ:[id number] DOB: 1950-05-19 DOA: 10/04/2021 DOS: the patient was seen and examined on 10/04/2021 PCP: Sherre Scarlet, PA-C  Patient coming from: Home  Chief Complaint:  Chief Complaint  Patient presents with   Weakness    HPI: Jermaine Kramer is a 71 y.o. male with medical history significant for Systolic CHF, EF 24%, CAD s/p CABG October 2022 with postop A-fib, s/p AICD 06/2021, history of frequent PVCs, type 2 diabetes, HTN, recently hospitalized from 5/18 to 5/20 with CHF exacerbation after presenting with an 8 pound weight gain, SOB and hypoxia, who presents to the ED for the second time in 2 days with nausea, diarrhea and decreased oral intake.  On his first visit yesterday GI studies were positive for rotavirus and CT abdomen and pelvis was negative.  He declined a recommendation for admission for IV hydration.  He returns today with protracted weakness and persistent symptoms and is open to staying in the hospital.  He denies abdominal pain or fever ED course and data review: On arrival hypotensive at 71/52 with otherwise normal vitals.  He was intermittently tachypneic to as high as 26 and O2 sat intermittently dipped to 86% on room air improving on its own without need for supplemental oxygen.  CBC was WNL.  CMP significant for creatinine of 1.58 up from baseline of 1.15 in March 2023, with mild acidosis with bicarb of 19.  Potassium 3.4.  Glucose 172.  EKG, personally viewed and interpreted showed sinus rhythm with RBBB and LAFB.  Patient received an LR bolus and hospitalist consulted for admission.   Review of Systems: As mentioned in the history of present illness. All other systems reviewed and are negative.  Past Medical History:  Diagnosis Date   Arrhythmia    atrial fibrillation   CHF (congestive heart failure) (HCC)    Coronary artery disease    Diabetes mellitus without complication (HCC)    GERD (gastroesophageal reflux  disease)    Hypercholesteremia    Hypertension    Sleep apnea    Stroke [facility])    Past Surgical History:  Procedure Laterality Date   CHOLECYSTECTOMY     COLONOSCOPY WITH PROPOFOL N/A 04/14/2019   Procedure: COLONOSCOPY WITH PROPOFOL;  Surgeon: Toledo, Benay Pike, MD;  Location: [facility] ENDOSCOPY;  Service: Gastroenterology;  Laterality: N/A;   CORONARY ARTERY BYPASS GRAFT  02/16/2021   ICD IMPLANT N/A 06/22/2021   Procedure: ICD IMPLANT;  Surgeon: Vickie Epley, MD;  Location: [place] [facility];  Service: Cardiovascular;  Laterality: N/A;   KNEE ARTHROSCOPY     RENAL CYST EXCISION     Social History:  reports that he has never smoked. He has never used smokeless tobacco. He reports that he does not currently use alcohol. He reports that he does not use drugs.  Allergies  Allergen Reactions   Doxycycline Anaphylaxis    Patient does not recall having this reaction.     Heparin Other (See Comments)    heparin-induced thrombocytopenia    Empagliflozin Itching    Groin infection Caused a groin infection.      Family History  Problem Relation Age of Onset   Parkinson's disease Mother    Lupus Mother    Heart disease Father     Prior to Admission medications   Medication Sig Start Date End Date Taking? Authorizing Provider  amiodarone (PACERONE) 200 MG tablet Take 200 mg by mouth daily. 03/06/21  Yes [provider]  apixaban (ELIQUIS) 5 MG TABS tablet Take 1 tablet (5 mg total) by mouth 2 (two) times daily. DO NOT RESTART UNTIL 06/27/2021. 06/22/21  Yes Dunn, Areta Haber, PA-C  aspirin EC 81 MG tablet Take 81 mg by mouth daily.   Yes [provider]  atorvastatin (LIPITOR) 80 MG tablet Take 80 mg by mouth daily. 02/10/21  Yes [provider]  metFORMIN (GLUCOPHAGE) 1000 MG tablet Take 1,000 mg by mouth 2 (two) times daily with a meal.   Yes [provider]  metoprolol succinate (TOPROL-XL) 25 MG 24 hr tablet Take 12.5 mg by mouth every evening.    Yes [provider]  omeprazole (PRILOSEC) 40 MG capsule Take 40 mg by mouth daily. 03/11/21  Yes [provider]  spironolactone (ALDACTONE) 25 MG tablet Take 12.5 mg by mouth daily. 09/20/21  Yes [provider]  tamsulosin (FLOMAX) 0.4 MG CAPS capsule Take 0.8 mg by mouth in the morning and at bedtime.   Yes [provider]  torsemide (DEMADEX) 20 MG tablet Take 1 tablet (20 mg total) by mouth daily. And additional '20mg'$  if needed 09/30/21  Yes Darylene Price A, FNP  Furosemide (FUROSCIX) 80 MG/10ML CTKT Inject 1 Cartridge into the skin as needed. Patient not taking: Reported on 10/04/2021 09/30/21   Darylene Price A, FNP  glucose blood (PRECISION QID TEST) test strip Use 3 (three) times daily Use as instructed. One touch ultra 07/05/21 07/05/22  [provider]  lactulose (CHRONULAC) 10 GM/15ML solution Take 30 mLs (20 g total) by mouth 2 (two) times daily. Patient taking differently: Take 20 g by mouth 2 (two) times daily. Takes as needed 09/17/21   Fritzi Mandes, MD  ondansetron (ZOFRAN) 4 MG tablet Take 4 mg by mouth every 8 (eight) hours as needed. 09/13/21   [provider]  polyethylene glycol (MIRALAX / GLYCOLAX) 17 g packet Take 17 g by mouth daily. Patient taking differently: Take 17 g by mouth daily. Takes as needed 09/17/21   Fritzi Mandes, MD    Physical Exam: Vitals:   10/04/21 1730 10/04/21 1745 10/04/21 1800 10/04/21 1900  BP: 96/64 (!) 94/57 (!) 89/64 94/66  Pulse: 73 72 68 68  Resp: (!) 23 (!) 25 (!) 21 15  Temp:      TempSrc:      SpO2: 96% 92% 95% 97%  Weight:      Height:       Physical Exam Vitals and nursing note reviewed.  Constitutional:      General: He is not in acute distress. HENT:     Head: Normocephalic and atraumatic.  Cardiovascular:     Rate and Rhythm: Normal rate and regular rhythm.     Heart sounds: Normal heart sounds.  Pulmonary:     Effort: Pulmonary effort is normal.     Breath sounds: Normal breath  sounds.  Abdominal:     Palpations: Abdomen is soft.     Tenderness: There is no abdominal tenderness.  Neurological:     Mental Status: Mental status is at baseline.    Labs on Admission: I have personally reviewed following labs and imaging studies  CBC: Recent Labs  Lab 10/03/21 1400 10/04/21 1258  WBC 8.7 8.6  NEUTROABS  --  6.9  HGB 15.7 14.6  HCT 47.4 44.9  MCV 92.0 92.2  PLT 197 539   Basic Metabolic Panel: Recent Labs  Lab 09/28/21 1304 09/30/21 1007 10/03/21 1400 10/04/21 1258  NA 135 140 137  135  K 4.8 4.6 3.7 3.4*  CL 103 107 107 107  CO2 21* 24 20* 19*  GLUCOSE 187* 116* 124* 172*  BUN 27* 28* 27* 24*  CREATININE 1.89* 1.81* 1.61* 1.58*  CALCIUM 8.8* 9.1 8.9 8.2*   GFR: Estimated Creatinine Clearance: 40.8 mL/min (A) (by C-G formula based on SCr of 1.58 mg/dL (H)). Liver Function Tests: Recent Labs  Lab 10/03/21 1400 10/04/21 1258  AST 58* 64*  ALT 45* 52*  ALKPHOS 70 62  BILITOT 1.1 0.7  PROT 7.7 7.0  ALBUMIN 4.2 3.7   Recent Labs  Lab 10/03/21 1400  LIPASE 35   No results for input(s): AMMONIA in the last 168 hours. Coagulation Profile: No results for input(s): INR, PROTIME in the last 168 hours. Cardiac Enzymes: No results for input(s): CKTOTAL, CKMB, CKMBINDEX, TROPONINI in the last 168 hours. BNP (last 3 results) No results for input(s): PROBNP in the last 8760 hours. HbA1C: No results for input(s): HGBA1C in the last 72 hours. CBG: No results for input(s): GLUCAP in the last 168 hours. Lipid Profile: No results for input(s): CHOL, HDL, LDLCALC, TRIG, CHOLHDL, LDLDIRECT in the last 72 hours. Thyroid Function Tests: No results for input(s): TSH, T4TOTAL, FREET4, T3FREE, THYROIDAB in the last 72 hours. Anemia Panel: No results for input(s): VITAMINB12, FOLATE, FERRITIN, TIBC, IRON, RETICCTPCT in the last 72 hours. Urine analysis:    Component Value Date/Time   COLORURINE YELLOW (A) 10/03/2021 1400   APPEARANCEUR HAZY (A)  10/03/2021 1400   APPEARANCEUR CLEAR 01/15/2013 1659   LABSPEC 1.024 10/03/2021 1400   LABSPEC 1.005 01/15/2013 1659   PHURINE 5.0 10/03/2021 1400   GLUCOSEU NEGATIVE 10/03/2021 1400   GLUCOSEU NEGATIVE 01/15/2013 1659   HGBUR SMALL (A) 10/03/2021 1400   BILIRUBINUR NEGATIVE 10/03/2021 1400   BILIRUBINUR NEGATIVE 01/15/2013 1659   KETONESUR 5 (A) 10/03/2021 1400   PROTEINUR 100 (A) 10/03/2021 1400   NITRITE NEGATIVE 10/03/2021 1400   LEUKOCYTESUR NEGATIVE 10/03/2021 1400   LEUKOCYTESUR 1+ 01/15/2013 1659    Radiological Exams on Admission: CT ABDOMEN PELVIS W CONTRAST  Result Date: 10/03/2021 CLINICAL DATA:  RLQ abdominal pain (Age >= 14y) EXAM: CT ABDOMEN AND PELVIS WITH CONTRAST TECHNIQUE: Multidetector CT imaging of the abdomen and pelvis was performed using the standard protocol following bolus administration of intravenous contrast. RADIATION DOSE REDUCTION: This exam was performed according to the departmental dose-optimization program which includes automated exposure control, adjustment of the mA and/or kV according to patient size and/or use of iterative reconstruction technique. CONTRAST:  68m OMNIPAQUE IOHEXOL 300 MG/ML  SOLN COMPARISON:  None Available. FINDINGS: Lower chest: Trace right pleural effusion. Partially visualized cardiac defibrillator/pacemaker. Tiny hiatal hernia. Hepatobiliary: No focal liver abnormality. Status post cholecystectomy. No biliary dilatation. Pancreas: No focal lesion. Normal pancreatic contour. No surrounding inflammatory changes. No main pancreatic ductal dilatation. Spleen: Normal in size without focal abnormality. Adrenals/Urinary Tract: No adrenal nodule bilaterally. Bilateral renal cortical scarring. Bilateral kidneys enhance symmetrically. Bilateral fluid density lesions within the kidneys likely represent simple renal cysts. Simple renal cysts, in the absence of clinically indicated signs/symptoms, require no independent follow-up. No  hydronephrosis. No hydroureter. The urinary bladder is unremarkable. Stomach/Bowel: Stomach is within normal limits. No evidence of bowel wall thickening or dilatation. Scattered colonic diverticulosis. Fluid within the lumen of the large bowel. Appendix appears normal. Vascular/Lymphatic: No abdominal aorta or iliac aneurysm. Severe atherosclerotic plaque of the aorta and its branches. No abdominal, pelvic, or inguinal lymphadenopathy. Reproductive: Prostate is enlarged measuring up to  5.8 cm. Other: No intraperitoneal free fluid. No intraperitoneal free gas. No organized fluid collection. Musculoskeletal: No abdominal wall hernia or abnormality. No suspicious lytic or blastic osseous lesions. No acute displaced fracture. Multilevel degenerative changes of the spine. IMPRESSION: 1. Tiny hiatal hernia. 2. Trace right pleural effusion. 3. Fast transition state of the colon. 4. Colonic diverticulosis with no acute diverticulitis. 5. Prostatomegaly. 6.  Aortic Atherosclerosis (ICD10-I70.0). Electronically Signed   By: Iven Finn M.D.   On: 10/03/2021 15:40   DG Chest Portable 1 View  Result Date: 10/03/2021 CLINICAL DATA:  Nausea and vomiting. EXAM: PORTABLE CHEST 1 VIEW COMPARISON:  Two-view chest x-ray 09/15/2021. CT of the abdomen pelvis 10/03/2021. CTA chest 07/23/2021. FINDINGS: Heart is enlarged.  Pacing and defibrillator wires are in place. Small right pleural effusion is present. Asymmetric right basilar airspace disease is present, improved from the more remote studies. IMPRESSION: 1. Cardiomegaly without failure. 2. Small right pleural effusion. 3. Asymmetric right basilar airspace disease likely reflects atelectasis. Electronically Signed   By: San Morelle M.D.   On: 10/03/2021 16:03     Data Reviewed: Relevant notes from primary care and specialist visits, past discharge summaries as available in EHR, including Care Everywhere. Prior diagnostic testing as pertinent to current admission  diagnoses Updated medications and problem lists for reconciliation ED course, including vitals, labs, imaging, treatment and response to treatment Triage notes, nursing and pharmacy notes and ED provider's notes Notable results as noted in HPI   Assessment and Plan: * Acute gastroenteritis, rotavirus Clear liquid diet to advance as tolerated IV hydration, IV antiemetics and supportive care  AKI (acute kidney injury) with metabolic acidosis(HCC) IV hydration, monitor renal function and avoid nephrotoxins  Hypotension IV hydration.  Hold antihypertensives and resume as appropriate.  Monitor for fluid overload Daily weights with intake and output monitoring  Hypokalemia Oral repletion and monitor  Diabetes mellitus without complication (HCC) Sliding scale insulin coverage  Atrial fibrillation, chronic (HCC) Continue amiodarone and apixaban  CAD s/p CABG x 3 Continue aspirin metoprolol and atorvastatin Troponin was slightly elevated but likely secondary to demand ischemia.  Patient denies chest pain and EKG nonacute  HFrEF (heart failure with reduced ejection fraction) (HCC) Euvolemic to dry Will hold metoprolol and spironolactone and torsemide due to low blood pressure in the 70s on arrival but monitor for fluid overload in the setting of IV hydration for treatment of dehydration from acute gastroenteritis Daily weights with intake and output monitoring        DVT prophylaxis: On apixaban  Consults: none  Advance Care Planning:   Code Status: Prior   Family Communication: none  Disposition Plan: Back to previous home environment  Severity of Illness: The appropriate patient status for this patient is OBSERVATION. Observation status is judged to be reasonable and necessary in order to provide the required intensity of service to ensure the patient's safety. The patient's presenting symptoms, physical exam findings, and initial radiographic and laboratory data in the  context of their medical condition is felt to place them at decreased risk for further clinical deterioration. Furthermore, it is anticipated that the patient will be medically stable for discharge from the hospital within 2 midnights of admission.   Author: Athena Masse, MD 10/04/2021 7:32 PM  For on call review [email or web address].

## 2021-10-04 NOTE — ED Provider Notes (Signed)
[facility] Provider Note    Event Date/Time   First MD Initiated Contact with Patient 10/04/21 1259     (approximate)   History   Chief Complaint Weakness   HPI Jermaine Kramer is a 71 y.o. male, history of type 2 diabetes, GERD, hypertension, HFrEF, hyperlipidemia, atrial fibrillation, status post CABG on Eliquis, presents to the emergency department for evaluation of weakness, he was reportedly here yesterday for evaluation of nausea, vomiting, and diarrhea x48 hours.  Found to have rotavirus.  Abdominal CT scan unremarkable.  He states that he was offered admission yesterday, however he declined because he felt well after the IV fluids.  He was subsequently discharged.  Since then, he states that his vomiting/diarrhea has slightly improved, though he has been significantly weak.  Denies fever/chills, chest pain, shortness of breath, abdominal pain, flank pain, urinary symptoms, rash/lesions, or numbness/tingling upper or lower extremities.   History Limitations: No limitations.  He is joined by his wife who corroborates his history.        Physical Exam  Triage Vital Signs: ED Triage Vitals  Enc Vitals Group     BP 10/04/21 1233 (!) 71/52     Pulse Rate 10/04/21 1233 80     Resp 10/04/21 1233 18     Temp 10/04/21 1233 98.2 F (36.8 C)     Temp Source 10/04/21 1233 Oral     SpO2 10/04/21 1233 96 %     Weight 10/04/21 1234 171 lb (77.6 kg)     Height 10/04/21 1234 '5\' 6"'$  (1.676 m)     Head Circumference --      Peak Flow --      Pain Score 10/04/21 1233 4     Pain Loc --      Pain Edu? --      Excl. in [place]? --     Most recent vital signs: Vitals:   10/04/21 1800 10/04/21 1900  BP: (!) 89/64 94/66  Pulse: 68 68  Resp: (!) 21 15  Temp:    SpO2: 95% 97%    General: Awake, appears exhausted. Skin: Warm, dry. No rashes or lesions.  Eyes: PERRL. Conjunctivae normal.  CV: Good peripheral perfusion.  Resp: Normal effort.  Lung sounds are clear  bilaterally the apices and bases. Abd: Soft, non-tender. No distention.  Neuro: At baseline. No gross neurological deficits.   Focused Exam: N/A.  Physical Exam    ED Results / Procedures / Treatments  Labs (all labs ordered are listed, but only abnormal results are displayed) Labs Reviewed  COMPREHENSIVE METABOLIC PANEL - Abnormal; Notable for the following components:      Result Value   Potassium 3.4 (*)    CO2 19 (*)    Glucose, Bld 172 (*)    BUN 24 (*)    Creatinine, Ser 1.58 (*)    Calcium 8.2 (*)    AST 64 (*)    ALT 52 (*)    GFR, Estimated 46 (*)    All other components within normal limits  TROPONIN I (HIGH SENSITIVITY) - Abnormal; Notable for the following components:   Troponin I (High Sensitivity) 51 (*)    All other components within normal limits  TROPONIN I (HIGH SENSITIVITY) - Abnormal; Notable for the following components:   Troponin I (High Sensitivity) 40 (*)    All other components within normal limits  CBC WITH DIFFERENTIAL/PLATELET     EKG Sinus rhythm, rate of 68, RBBB present, LAFB  present, no significant ST segment changes.   RADIOLOGY  ED Provider Interpretation: Personally reviewed and interpreted this CT scan from the day prior.  Trace right pleural effusion, otherwise no acute findings.  No results found.  PROCEDURES:  Critical Care performed: N/A.  Procedures    MEDICATIONS ORDERED IN ED: Medications  lactated ringers infusion (has no administration in time range)  sodium chloride 0.9 % bolus 1,000 mL (0 mLs Intravenous Stopped 10/04/21 1402)  ondansetron (ZOFRAN) injection 4 mg (4 mg Intravenous Given 10/04/21 1308)  lactated ringers bolus 1,000 mL (0 mLs Intravenous Stopped 10/04/21 1639)     IMPRESSION / MDM / ASSESSMENT AND PLAN / ED COURSE  I reviewed the triage vital signs and the nursing notes.                              Differential diagnosis includes, but is not limited to, viral gastroenteritis, dehydration,  bacterial gastroenteritis, AKI, hypokalemia, anemia, hyponatremia.  ED Course Patient appears stable, but exhausted.  Notably hypotensive at 71/52.  No significant pain, though does still have nausea.  We will go ahead treat with IV fluids and ondansetron.  CBC shows no leukocytosis or anemia.  CMP shows mild hypokalemia at 3.4 and elevated glucose at 172.  Elevated creatinine at 1.58.  Initial troponin 51, second troponin 40.  Unlikely ACS.  Assessment/Plan Presentation consistent with gastroenteritis secondary to rotavirus.  Patient continues to be very weak and nauseous despite treatment.  Continues to be mildly hypotensive despite 2 IV fluid boluses.  Poor p.o. intake at this time.  Lab work-up has been unremarkable and patient clinically appears stable, but given his comorbidities, I believe it would be safest to admit for observation.  Spoke to the on-call hospitalist, Dr. Damita Dunnings, who agreed to accept admission.  Patient's presentation is most consistent with acute presentation with potential threat to life or bodily function.       FINAL CLINICAL IMPRESSION(S) / ED DIAGNOSES   Final diagnoses:  Weakness  Rotavirus infection     Rx / DC Orders   ED Discharge Orders     None        Note:  This document was prepared using Dragon voice recognition software and may include unintentional dictation errors.   Teodoro Spray, [place] 10/04/21 Lattie Corns    Delman Kitten, MD 10/09/21 [phone]

## 2021-10-04 NOTE — Progress Notes (Signed)
Remote ICD transmission.   

## 2021-10-04 NOTE — Assessment & Plan Note (Addendum)
Continue aspirin metoprolol and atorvastatin Troponin was slightly elevated but likely secondary to demand ischemia.  Patient denies chest pain and EKG nonacute

## 2021-10-04 NOTE — ED Provider Triage Note (Signed)
  Emergency Medicine Provider Triage Evaluation Note  Jermaine Kramer , a 71 y.o.male,  was evaluated in triage.  Pt complains of nausea, vomiting, diarrhea, weakness x3 days.  Was recently evaluated yesterday and diagnosed with rhinovirus.  He was offered admission, however declined.  He is back because he feels significantly more weak, no vomiting or diarrhea has improved slightly   Review of Systems  Positive: Nausea/vomiting, diarrhea, weakness Negative: Denies fever, chest pain, abdominal pain  Physical Exam   Vitals:   10/04/21 1233  BP: (!) 71/52  Pulse: 80  Resp: 18  Temp: 98.2 F (36.8 C)  SpO2: 96%   Gen:   Awake, appears exhausted Resp:  Normal effort  MSK:   Moves extremities without difficulty  Other:    Medical Decision Making  Given the patient's initial medical screening exam, the following diagnostic evaluation has been ordered. The patient will be placed in the appropriate treatment space, once one is available, to complete the evaluation and treatment. I have discussed the plan of care with the patient and I have advised the patient that an ED physician or mid-level practitioner will reevaluate their condition after the test results have been received, as the results may give them additional insight into the type of treatment they may need.    Diagnostics: Labs  Treatments: IV fluids, ondansetron   Teodoro Spray, [place] 10/04/21 1237

## 2021-10-04 NOTE — Assessment & Plan Note (Signed)
Sliding scale insulin coverage 

## 2021-10-04 NOTE — Assessment & Plan Note (Signed)
IV hydration, monitor renal function and avoid nephrotoxins 

## 2021-10-04 NOTE — ED Triage Notes (Signed)
Pt via POV from home. Pt c/o generalized weakness, pt was seen here last night and was dx with Rotovirus states that has been having stools approx every 2 hours. Denies any vomiting but does feel nauseous. Pt was given the option to stay or be discharged and pt wanted to be discharged. Pt is A&OX4 and NAD

## 2021-10-04 NOTE — Assessment & Plan Note (Signed)
Euvolemic to dry Will hold metoprolol and spironolactone and torsemide due to low blood pressure in the 70s on arrival but monitor for fluid overload in the setting of IV hydration for treatment of dehydration from acute gastroenteritis Daily weights with intake and output monitoring

## 2021-10-04 NOTE — ED Notes (Signed)
Report received from William, RN 

## 2021-10-04 NOTE — ED Notes (Signed)
Pt provided with crackers, PB, and water per pt's request with MD approval.

## 2021-10-04 NOTE — Telephone Encounter (Signed)
[employer] approved request for [employer] cartridge kit for patient. [employer] [facility] should be shipping it to patient per our request through patient assistance.   Claudetta Sallie, NT

## 2021-10-05 ENCOUNTER — Encounter: Payer: Self-pay | Admitting: Internal Medicine

## 2021-10-05 DIAGNOSIS — I5022 Chronic systolic (congestive) heart failure: Secondary | ICD-10-CM | POA: Diagnosis present

## 2021-10-05 DIAGNOSIS — R0902 Hypoxemia: Secondary | ICD-10-CM | POA: Diagnosis present

## 2021-10-05 DIAGNOSIS — E119 Type 2 diabetes mellitus without complications: Secondary | ICD-10-CM | POA: Diagnosis present

## 2021-10-05 DIAGNOSIS — I248 Other forms of acute ischemic heart disease: Secondary | ICD-10-CM | POA: Diagnosis present

## 2021-10-05 DIAGNOSIS — I9589 Other hypotension: Secondary | ICD-10-CM | POA: Diagnosis present

## 2021-10-05 DIAGNOSIS — I110 Hypertensive heart disease with heart failure: Secondary | ICD-10-CM | POA: Diagnosis present

## 2021-10-05 DIAGNOSIS — E861 Hypovolemia: Secondary | ICD-10-CM | POA: Diagnosis present

## 2021-10-05 DIAGNOSIS — A080 Rotaviral enteritis: Secondary | ICD-10-CM | POA: Diagnosis present

## 2021-10-05 DIAGNOSIS — I482 Chronic atrial fibrillation, unspecified: Secondary | ICD-10-CM | POA: Diagnosis present

## 2021-10-05 DIAGNOSIS — E860 Dehydration: Secondary | ICD-10-CM | POA: Diagnosis present

## 2021-10-05 DIAGNOSIS — K7200 Acute and subacute hepatic failure without coma: Secondary | ICD-10-CM | POA: Diagnosis present

## 2021-10-05 DIAGNOSIS — E876 Hypokalemia: Secondary | ICD-10-CM | POA: Diagnosis present

## 2021-10-05 DIAGNOSIS — K529 Noninfective gastroenteritis and colitis, unspecified: Secondary | ICD-10-CM | POA: Diagnosis not present

## 2021-10-05 DIAGNOSIS — E7800 Pure hypercholesterolemia, unspecified: Secondary | ICD-10-CM | POA: Diagnosis present

## 2021-10-05 DIAGNOSIS — Z888 Allergy status to other drugs, medicaments and biological substances status: Secondary | ICD-10-CM | POA: Diagnosis not present

## 2021-10-05 DIAGNOSIS — Z95810 Presence of automatic (implantable) cardiac defibrillator: Secondary | ICD-10-CM | POA: Diagnosis not present

## 2021-10-05 DIAGNOSIS — Z881 Allergy status to other antibiotic agents status: Secondary | ICD-10-CM | POA: Diagnosis not present

## 2021-10-05 DIAGNOSIS — I2510 Atherosclerotic heart disease of native coronary artery without angina pectoris: Secondary | ICD-10-CM | POA: Diagnosis present

## 2021-10-05 DIAGNOSIS — I452 Bifascicular block: Secondary | ICD-10-CM | POA: Diagnosis present

## 2021-10-05 DIAGNOSIS — R531 Weakness: Secondary | ICD-10-CM | POA: Diagnosis present

## 2021-10-05 DIAGNOSIS — A084 Viral intestinal infection, unspecified: Secondary | ICD-10-CM | POA: Diagnosis present

## 2021-10-05 DIAGNOSIS — Z8673 Personal history of transient ischemic attack (TIA), and cerebral infarction without residual deficits: Secondary | ICD-10-CM | POA: Diagnosis not present

## 2021-10-05 DIAGNOSIS — E872 Acidosis, unspecified: Secondary | ICD-10-CM | POA: Diagnosis present

## 2021-10-05 DIAGNOSIS — Z951 Presence of aortocoronary bypass graft: Secondary | ICD-10-CM | POA: Diagnosis not present

## 2021-10-05 DIAGNOSIS — N179 Acute kidney failure, unspecified: Secondary | ICD-10-CM | POA: Diagnosis present

## 2021-10-05 DIAGNOSIS — Z8249 Family history of ischemic heart disease and other diseases of the circulatory system: Secondary | ICD-10-CM | POA: Diagnosis not present

## 2021-10-05 LAB — GLUCOSE, CAPILLARY
Glucose-Capillary: 99 mg/dL (ref 70–99)
Glucose-Capillary: 106 mg/dL — ABNORMAL HIGH (ref 70–99)

## 2021-10-05 LAB — BASIC METABOLIC PANEL
Anion gap: 7 (ref 5–15)
BUN: 23 mg/dL (ref 8–23)
CO2: 20 mmol/L — ABNORMAL LOW (ref 22–32)
Calcium: 8 mg/dL — ABNORMAL LOW (ref 8.9–10.3)
Chloride: 111 mmol/L (ref 98–111)
Creatinine, Ser: 1.55 mg/dL — ABNORMAL HIGH (ref 0.61–1.24)
GFR, Estimated: 47 mL/min — ABNORMAL LOW (ref >60)
Glucose, Bld: 108 mg/dL — ABNORMAL HIGH (ref 70–99)
Potassium: 3.1 mmol/L — ABNORMAL LOW (ref 3.5–5.1)
Sodium: 138 mmol/L (ref 135–145)

## 2021-10-05 LAB — CBC
HCT: 41.3 % (ref 39.0–52.0)
Hemoglobin: 13.4 g/dL (ref 13.0–17.0)
MCH: 30.4 pg (ref 26.0–34.0)
MCHC: 32.4 g/dL (ref 30.0–36.0)
MCV: 93.7 fL (ref 80.0–100.0)
Platelets: 141 10*3/uL — ABNORMAL LOW (ref 150–400)
RBC: 4.41 MIL/uL (ref 4.22–5.81)
RDW: 12.8 % (ref 11.5–15.5)
WBC: 4.6 10*3/uL (ref 4.0–10.5)
nRBC: 0 % (ref 0.0–0.2)

## 2021-10-05 LAB — CBG MONITORING, ED
Glucose-Capillary: 104 mg/dL — ABNORMAL HIGH (ref 70–99)
Glucose-Capillary: 103 mg/dL — ABNORMAL HIGH (ref 70–99)

## 2021-10-05 MED ORDER — POTASSIUM CHLORIDE CRYS ER 20 MEQ PO TBCR
40.0000 meq | EXTENDED_RELEASE_TABLET | Freq: Two times a day (BID) | ORAL | Status: AC
  Administered 2021-10-05 – 2021-10-06 (×3): 40 meq via ORAL
  Filled 2021-10-05 (×3): qty 2

## 2021-10-05 MED ORDER — METOPROLOL SUCCINATE ER 25 MG PO TB24
12.5000 mg | ORAL_TABLET | Freq: Every evening | ORAL | Status: DC
  Administered 2021-10-05 – 2021-10-06 (×2): 12.5 mg via ORAL
  Filled 2021-10-05: qty 0.5
  Filled 2021-10-05 (×2): qty 1

## 2021-10-05 NOTE — Progress Notes (Signed)
Patient arrived to floor, alert and oriented.  Oriented patient and wife to room and call bell.  Patient independently ambulated in room and to bathroom without difficulty or assistance.  Will continue to monitor.

## 2021-10-05 NOTE — ED Notes (Signed)
Breakfast tray placed at bedside.  

## 2021-10-05 NOTE — Progress Notes (Signed)
Admission profile updated. ?

## 2021-10-05 NOTE — Evaluation (Signed)
Occupational Therapy Evaluation Patient Details Name: Jermaine Kramer MRN: [id number] DOB: 03-07-1951 Today's Date: 10/05/2021   History of Present Illness Pt is a 71 y.o. male with medical history significant for Systolic CHF, EF 09%, CAD s/p CABG October 2022 with postop A-fib, s/p AICD 06/2021, history of frequent PVCs, type 2 diabetes, HTN who presents to the ED for the second time in 2 days with nausea, diarrhea and decreased oral intake.   Clinical Impression   Patient presenting with decreased Ind in self care, balance, functional mobility/transfers, endurance, and safety awareness. Patient reports being independent without use of AD at baseline. He lives with wife who drives him to appointments. They complete ADL's together. Patient currently functioning at min guard without use of AD for short distance ambulation, toileting, and hygiene/clothing management.  Patient will benefit from acute OT to increase overall independence in the areas of ADLs, functional mobility, and safety awareness in order to safely discharge home with family.      Recommendations for follow up therapy are one component of a multi-disciplinary discharge planning process, led by the attending physician.  Recommendations may be updated based on patient status, additional functional criteria and insurance authorization.   Follow Up Recommendations  Home health OT    Assistance Recommended at Discharge Intermittent Supervision/Assistance  Patient can return home with the following A little help with walking and/or transfers;A little help with bathing/dressing/bathroom;Assistance with cooking/housework;Assist for transportation;Help with stairs or ramp for entrance    Functional Status Assessment  Patient has had a recent decline in their functional status and demonstrates the ability to make significant improvements in function in a reasonable and predictable amount of time.  Equipment Recommendations  None recommended  by OT       Precautions / Restrictions Precautions Precautions: Fall Restrictions Weight Bearing Restrictions: No      Mobility Bed Mobility Overal bed mobility: Needs Assistance Bed Mobility: Supine to Sit, Sit to Supine     Supine to sit: Supervision Sit to supine: Supervision   General bed mobility comments: extra time and effort but no physical assistance    Transfers Overall transfer level: Needs assistance Equipment used: 1 person hand held assist Transfers: Sit to/from Stand Sit to Stand: Min guard                  Balance Overall balance assessment: Needs assistance Sitting-balance support: Feet supported Sitting balance-Leahy Scale: Good     Standing balance support: During functional activity Standing balance-Leahy Scale: Fair                             ADL either performed or assessed with clinical judgement   ADL Overall ADL's : Needs assistance/impaired                         Toilet Transfer: Economist and Hygiene: Supervision/safety       Functional mobility during ADLs: Supervision/safety;Min guard       Vision Patient Visual Report: No change from baseline              Pertinent Vitals/Pain Pain Assessment Pain Assessment: No/denies pain        Extremity/Trunk Assessment Upper Extremity Assessment Upper Extremity Assessment: Overall WFL for tasks assessed   Lower Extremity Assessment Lower Extremity Assessment: Generalized weakness       Communication Communication Communication: No difficulties  Cognition Arousal/Alertness: Awake/alert Behavior During Therapy: WFL for tasks assessed/performed Overall Cognitive Status: Within Functional Limits for tasks assessed                                                  Home Living Family/patient expects to be discharged to:: Private residence Living Arrangements:  Spouse/significant other Available Help at Discharge: Family;Available 24 hours/day Type of Home: House Home Access: Stairs to enter [employer] of Steps: 4 Entrance Stairs-Rails: Can reach both Home Layout: One level     Bathroom Shower/Tub: Teacher, early years/pre: Standard Bathroom Accessibility: Yes   Home Equipment: International aid/development worker (2 wheels);Cane - single point          Prior Functioning/Environment Prior Level of Function : Independent/Modified Independent             Mobility Comments: pt is prior community ambulator using no AD ADLs Comments: independent wih ADLs , wife drives, and they share IADL tasks        OT Problem List: Decreased strength;Decreased knowledge of use of DME or AE;Decreased activity tolerance;Impaired balance (sitting and/or standing);Decreased safety awareness      OT Treatment/Interventions: Self-care/ADL training;Manual therapy;Therapeutic exercise;Patient/family education;Balance training;Energy conservation;Therapeutic activities;DME and/or AE instruction    OT Goals(Current goals can be found in the care plan section) Acute Rehab OT Goals Patient Stated Goal: to go home OT Goal Formulation: With patient Time For Goal Achievement: 10/19/21 Potential to Achieve Goals: Good ADL Goals Pt Will Perform Grooming: with modified independence Pt Will Transfer to Toilet: with modified independence Pt Will Perform Toileting - Clothing Manipulation and hygiene: with modified independence Pt Will Perform Tub/Shower Transfer: with modified independence  OT Frequency: Min 2X/week       AM-PAC OT "6 Clicks" Daily Activity     Outcome Measure Help from another person eating meals?: None Help from another person taking care of personal grooming?: A Little Help from another person toileting, which includes using toliet, bedpan, or urinal?: A Little Help from another person bathing (including washing,  rinsing, drying)?: A Little Help from another person to put on and taking off regular upper body clothing?: None Help from another person to put on and taking off regular lower body clothing?: A Little 6 Click Score: 20   End of Session Nurse Communication: Mobility status  Activity Tolerance: Patient limited by fatigue;Patient tolerated treatment well Patient left: in bed;with call bell/phone within reach;with family/visitor present  OT Visit Diagnosis: Unsteadiness on feet (R26.81);Muscle weakness (generalized) (M62.81)                Time: 5701-7793 OT Time Calculation (min): 10 min Charges:  OT General Charges $OT Visit: 1 Visit OT Evaluation $OT Eval Moderate Complexity: 1 [address] [place], OTR/L , CBIS ascom [phone]  10/05/21, 12:42 PM

## 2021-10-05 NOTE — Progress Notes (Signed)
Jermaine Kramer  FTD:[id number] DOB: 12/28/1950 DOA: 10/04/2021 PCP: Sherre Scarlet, PA-C    Brief Narrative:  71 year old with a history of chronic systolic CHF (EF 06%), CAD status post CABG October 2022, atrial fibrillation, AICD March 2023, DM2, and HTN who required hospitalization 5/18 > 5/20 due to a CHF exacerbation and returned to the ER for the second time in 2 days 10/04/21 with severe nausea diarrhea and poor oral intake.  On his initial visit he was found to be positive for rotavirus and a CT abdomen and pelvis was negative.  At that time he declined recommendation for IV hydration.  He returned to the ER 6/6 with protracted weakness and persisting symptoms and this time was willing to follow medical advice.  In the ER he was found to be hypotensive at 71/52.  Creatinine was noted to be elevated at 1.58 from a baseline of 1.15.  Consultants:  None  Goals of Care:  Code Status: Full Code   DVT prophylaxis: Eliquis  Interim Hx: Afebrile.  Blood pressure improving.  Heart rate stable.  States he is feeling better overall.  No chest pain or shortness of breath.  No significant peripheral edema thus far.  Assessment & Plan:  Acute rotavirus gastroenteritis Continue IV fluid support and symptom management -C. difficile negative  Hypotension due to hypovolemia In the setting of gastroenteritis with increased GI losses and poor intake -continue IV fluid support -slow rate this afternoon as blood pressure improved  Acute kidney insult Prerenal due to significant dehydration -continue volume resuscitation -recheck creatinine in a.m.  Transaminitis Likely mild shock liver in setting of hypotension -monitor trend  Hypokalemia Due to increased GI losses -supplement and follow -check magnesium  DM2 CBG well controlled -A1c 7.8  Chronic atrial fibrillation Continue his usual amiodarone and apixaban  Chronic systolic CHF As noted above patient actually dehydrated at time of  presentation -holding many of his usual medications in the setting of hypotension -hydrate and follow closely -monitor daily weights and I's and O's -EF 30-35% with global LV hypokinesis and grade 3 DD per TTE 09/16/2021  CAD status post CABG x3 Continue aspirin beta-blocker and atorvastatin -no chest pain  Family Communication: Spoke with wife at bedside Disposition: From home -anticipate return home in 24-48 hours   Objective: Blood pressure 99/70, pulse (!) 59, temperature 97.9 F (36.6 C), temperature source Oral, resp. rate (!) 22, height '5\' 6"'$  (1.676 m), weight 77.6 kg, SpO2 95 %.  Intake/Output Summary (Last 24 hours) at 10/05/2021 0734 Last data filed at 10/05/2021 2376 Gross per 24 hour  Intake 1000 ml  Output 200 ml  Net 800 ml   Filed Weights   10/04/21 1234  Weight: 77.6 kg    Examination: General: No acute respiratory distress Lungs: Clear to auscultation bilaterally without wheezes or crackles Cardiovascular: Regular rate and rhythm without murmur gallop or rub normal S1 and S2 Abdomen: Nontender, nondistended, soft, bowel sounds positive, no rebound, no ascites, no appreciable mass Extremities: No significant cyanosis, clubbing, or edema bilateral lower extremities  CBC: Recent Labs  Lab 10/03/21 1400 10/04/21 1258 10/05/21 0600  WBC 8.7 8.6 4.6  NEUTROABS  --  6.9  --   HGB 15.7 14.6 13.4  HCT 47.4 44.9 41.3  MCV 92.0 92.2 93.7  PLT 197 176 283*   Basic Metabolic Panel: Recent Labs  Lab 10/03/21 1400 10/04/21 1258 10/05/21 0600  NA 137 135 138  K 3.7 3.4* 3.1*  CL 107 107 111  CO2 20* 19* 20*  GLUCOSE 124* 172* 108*  BUN 27* 24* 23  CREATININE 1.61* 1.58* 1.55*  CALCIUM 8.9 8.2* 8.0*   GFR: Estimated Creatinine Clearance: 41.6 mL/min (A) (by C-G formula based on SCr of 1.55 mg/dL (H)).  Liver Function Tests: Recent Labs  Lab 10/03/21 1400 10/04/21 1258  AST 58* 64*  ALT 45* 52*  ALKPHOS 70 62  BILITOT 1.1 0.7  PROT 7.7 7.0  ALBUMIN  4.2 3.7   Recent Labs  Lab 10/03/21 1400  LIPASE 35    HbA1C: Hgb A1c MFr Bld  Date/Time Value Ref Range Status  05/16/2021 07:54 AM 7.8 (H) 4.8 - 5.6 % Final    Comment:    (NOTE) Pre diabetes:          5.7%-6.4%  Diabetes:              >6.4%  Glycemic control for   <7.0% adults with diabetes     CBG: Recent Labs  Lab 10/04/21 2307 10/05/21 0723  GLUCAP 134* 104*    Scheduled Meds:  amiodarone  200 mg Oral Daily   apixaban  5 mg Oral BID   aspirin EC  81 mg Oral Daily   atorvastatin  80 mg Oral Daily   insulin aspart  0-15 Units Subcutaneous TID WC   insulin aspart  0-5 Units Subcutaneous QHS   metFORMIN  1,000 mg Oral BID WC   tamsulosin  0.8 mg Oral QPC supper   Continuous Infusions:  lactated ringers 1,000 mL (10/05/21 0634)     LOS: 0 days   Cherene Altes, MD [facility] Office  [phone] Pager - Text Page per Shea Evans  If 7PM-7AM, please contact night-coverage per Amion 10/05/2021, 7:34 AM

## 2021-10-05 NOTE — Evaluation (Signed)
Physical Therapy Evaluation Patient Details Name: Jermaine Kramer MRN: [id number] DOB: November 28, 1950 Today's Date: 10/05/2021  History of Present Illness  Pt is a 71 y.o. male with medical history significant for Systolic CHF, EF 40%, CAD s/p CABG October 2022 with postop A-fib, s/p AICD 06/2021, history of frequent PVCs, type 2 diabetes, HTN who presents to the ED for the second time in 2 days with nausea, diarrhea and decreased oral intake. Per MD diagnosis of Acute gastroenteritis, rotavirus, AKI (acute kidney injury) with metabolic acidosis(HCC) and Hypokelemia and Hypotension.  Clinical Impression  Pt was pleasant and motivated to participate during the session and put forth good effort throughout. Pt is able to perform bed mobility with supervision and requiring extra time and effort to perform task. Pt was able to ambulate with RW 14f and another 569fusing no AD with good stability and no LOB. Per nursing pt was given ok to trial activities with room air with pt having steady SPO2 >= 90 throughout session. No PT follow up recommended at this time after discharge but instead to continue with current cardiac rehab plan.     Recommendations for follow up therapy are one component of a multi-disciplinary discharge planning process, led by the attending physician.  Recommendations may be updated based on patient status, additional functional criteria and insurance authorization.  Follow Up Recommendations Other (comment) (Recommendation to continue current cardiac rehab plan)    Assistance Recommended at Discharge Intermittent Supervision/Assistance  Patient can return home with the following  A little help with walking and/or transfers;A little help with bathing/dressing/bathroom;Assistance with cooking/housework;Help with stairs or ramp for entrance    Equipment Recommendations None recommended by PT  Recommendations for Other Services       Functional Status Assessment Patient has had a  recent decline in their functional status and demonstrates the ability to make significant improvements in function in a reasonable and predictable amount of time.     Precautions / Restrictions Precautions Precautions: Fall Restrictions Weight Bearing Restrictions: No      Mobility  Bed Mobility Overal bed mobility: Needs Assistance Bed Mobility: Supine to Sit, Sit to Supine     Supine to sit: Supervision Sit to supine: Supervision   General bed mobility comments: extra time and effort needed to perform task    Transfers Overall transfer level: Needs assistance Equipment used: Rolling walker (2 wheels) Transfers: Sit to/from Stand Sit to Stand: Min guard                Ambulation/Gait Ambulation/Gait assistance: Min guard Gait Distance (Feet): 50 Feet (x2) Assistive device: Rolling walker (2 wheels), None Gait Pattern/deviations: Decreased step length - right, Decreased step length - left, Step-through pattern Gait velocity: decreased     General Gait Details: good stability and no LOB; able to ambulate using no AD  Stairs            Wheelchair Mobility    Modified Rankin (Stroke Patients Only)       Balance Overall balance assessment: Needs assistance Sitting-balance support: Feet supported Sitting balance-Leahy Scale: Good     Standing balance support: During functional activity Standing balance-Leahy Scale: Good                               Pertinent Vitals/Pain Pain Assessment Pain Assessment: 0-10 Pain Score: 0-No pain    Home Living Family/patient expects to be discharged to:: Private residence Living Arrangements: Spouse/significant  other (wife) Available Help at Discharge: Family;Available 24 hours/day Type of Home: House Home Access: Stairs to enter Entrance Stairs-Rails: Can reach both Entrance Stairs-Number of Steps: 4   Home Layout: One level Home Equipment: International aid/development worker (2  wheels);Cane - single point      Prior Function Prior Level of Function : Independent/Modified Independent             Mobility Comments: pt is prior community ambulator using no AD ADLs Comments: independe wih ADLs     Hand Dominance        Extremity/Trunk Assessment   Upper Extremity Assessment Upper Extremity Assessment: Overall WFL for tasks assessed    Lower Extremity Assessment Lower Extremity Assessment: Generalized weakness       Communication   Communication: No difficulties  Cognition Arousal/Alertness: Awake/alert Behavior During Therapy: WFL for tasks assessed/performed Overall Cognitive Status: Within Functional Limits for tasks assessed                                          General Comments      Exercises     Assessment/Plan    PT Assessment Patient needs continued PT services  PT Problem List Decreased strength;Decreased activity tolerance;Decreased balance;Decreased mobility;Decreased coordination       PT Treatment Interventions DME instruction;Gait training;Stair training;Functional mobility training;Therapeutic activities;Patient/family education;Balance training;Therapeutic exercise    PT Goals (Current goals can be found in the Care Plan section)  Acute Rehab PT Goals Patient Stated Goal: pt would like to gain strength back PT Goal Formulation: With patient Time For Goal Achievement: 10/18/21 Potential to Achieve Goals: Good    Frequency Min 2X/week     Co-evaluation               AM-PAC PT "6 Clicks" Mobility  Outcome Measure Help needed turning from your back to your side while in a flat bed without using bedrails?: A Little Help needed moving from lying on your back to sitting on the side of a flat bed without using bedrails?: A Little Help needed moving to and from a bed to a chair (including a wheelchair)?: A Little Help needed standing up from a chair using your arms (e.g., wheelchair or bedside  chair)?: A Little Help needed to walk in hospital room?: A Little Help needed climbing 3-5 steps with a railing? : A Little 6 Click Score: 18    End of Session Equipment Utilized During Treatment: Gait belt Activity Tolerance: Patient tolerated treatment well Patient left: in bed;with call bell/phone within reach Nurse Communication: Mobility status PT Visit Diagnosis: Difficulty in walking, not elsewhere classified (R26.2);Muscle weakness (generalized) (M62.81)    Time: 3267-1245 PT Time Calculation (min) (ACUTE ONLY): 23 min   Charges:              Turner Daniels, SPT  10/05/2021, 11:51 AM

## 2021-10-05 NOTE — ED Notes (Signed)
OT at bedside with patient. 

## 2021-10-06 ENCOUNTER — Ambulatory Visit: Payer: Medicare Other | Admitting: Family

## 2021-10-06 LAB — GLUCOSE, CAPILLARY
Glucose-Capillary: 123 mg/dL — ABNORMAL HIGH (ref 70–99)
Glucose-Capillary: 150 mg/dL — ABNORMAL HIGH (ref 70–99)
Glucose-Capillary: 127 mg/dL — ABNORMAL HIGH (ref 70–99)
Glucose-Capillary: 129 mg/dL — ABNORMAL HIGH (ref 70–99)

## 2021-10-06 LAB — COMPREHENSIVE METABOLIC PANEL
ALT: 49 U/L — ABNORMAL HIGH (ref 0–44)
AST: 50 U/L — ABNORMAL HIGH (ref 15–41)
Albumin: 3 g/dL — ABNORMAL LOW (ref 3.5–5.0)
Alkaline Phosphatase: 51 U/L (ref 38–126)
Anion gap: 4 — ABNORMAL LOW (ref 5–15)
BUN: 20 mg/dL (ref 8–23)
CO2: 17 mmol/L — ABNORMAL LOW (ref 22–32)
Calcium: 7.9 mg/dL — ABNORMAL LOW (ref 8.9–10.3)
Chloride: 117 mmol/L — ABNORMAL HIGH (ref 98–111)
Creatinine, Ser: 1.4 mg/dL — ABNORMAL HIGH (ref 0.61–1.24)
GFR, Estimated: 53 mL/min — ABNORMAL LOW (ref >60)
Glucose, Bld: 127 mg/dL — ABNORMAL HIGH (ref 70–99)
Potassium: 3.7 mmol/L (ref 3.5–5.1)
Sodium: 138 mmol/L (ref 135–145)
Total Bilirubin: 0.4 mg/dL (ref 0.3–1.2)
Total Protein: 5.9 g/dL — ABNORMAL LOW (ref 6.5–8.1)

## 2021-10-06 LAB — CBC
HCT: 40 % (ref 39.0–52.0)
Hemoglobin: 13.3 g/dL (ref 13.0–17.0)
MCH: 30.4 pg (ref 26.0–34.0)
MCHC: 33.3 g/dL (ref 30.0–36.0)
MCV: 91.5 fL (ref 80.0–100.0)
Platelets: 140 10*3/uL — ABNORMAL LOW (ref 150–400)
RBC: 4.37 MIL/uL (ref 4.22–5.81)
RDW: 12.6 % (ref 11.5–15.5)
WBC: 3.8 10*3/uL — ABNORMAL LOW (ref 4.0–10.5)
nRBC: 0 % (ref 0.0–0.2)

## 2021-10-06 LAB — MAGNESIUM: Magnesium: 1.5 mg/dL — ABNORMAL LOW (ref 1.7–2.4)

## 2021-10-06 MED ORDER — MAGNESIUM SULFATE 2 GM/50ML IV SOLN
2.0000 g | Freq: Once | INTRAVENOUS | Status: AC
  Administered 2021-10-06: 2 g via INTRAVENOUS
  Filled 2021-10-06: qty 50

## 2021-10-06 NOTE — Care Management Important Message (Signed)
Important Message  Patient Details  Name: GOMER FRANCE MRN: [id number] Date of Birth: 1950/11/05   Medicare Important Message Given:  N/A - LOS <3 / Initial given by admissions     Dannette Barbara 10/06/2021, 1:43 PM

## 2021-10-06 NOTE — Progress Notes (Signed)
Jermaine Kramer  EYC:[id number] DOB: 1951/01/21 DOA: 10/04/2021 PCP: Sherre Scarlet, PA-C    Brief Narrative:  71 year old with a history of chronic systolic CHF (EF 14%), CAD status post CABG October 2022, atrial fibrillation, AICD March 2023, DM2, and HTN who required hospitalization 5/18 > 5/20 due to a CHF exacerbation and returned to the ER for the second time in 2 days 10/04/21 with severe nausea diarrhea and poor oral intake.  On his initial visit he was found to be positive for rotavirus and a CT abdomen and pelvis was negative.  At that time he declined recommendation for IV hydration.  He returned to the ER 6/6 with protracted weakness and persisting symptoms and this time was willing to follow medical advice.  In the ER he was found to be hypotensive at 71/52.  Creatinine was noted to be elevated at 1.58 from a baseline of 1.15.  Consultants:  None  Goals of Care:  Code Status: Full Code   DVT prophylaxis: Eliquis  Interim Hx: Afebrile.  Vital stable with blood pressure improved.  Patient's feels he is improving overall.  Diarrhea persists but seems to be slowing down some.  No nausea or vomiting.  Feels he can eat more now.  Denies chest pain or shortness of breath.  Assessment & Plan:  Acute Rotavirus gastroenteritis C. difficile negative -exercised great care with volume resuscitation given significant CHF -clinically improving -attempting to stop IV fluid today with reliance upon oral intake  Hypotension due to hypovolemia In the setting of gastroenteritis with increased GI losses and poor intake -blood pressure improving after volume resuscitation  Acute kidney insult Prerenal due to significant dehydration -creatinine improving with volume resuscitation  Hypomagnesemia Due to diminished intake and GI loss -supplement and recheck in a.m.  Transaminitis Likely mild shock liver in setting of hypotension -improving with stabilization of blood pressure  Hypokalemia Due  to increased GI losses -corrected with supplementation  DM2 CBG well controlled -A1c 7.8  Chronic atrial fibrillation Continue his usual amiodarone and apixaban  Chronic systolic CHF As noted above patient actually dehydrated at time of presentation -holding many of his usual medications in the setting of hypotension -hydrate and follow closely -monitor daily weights and I's and O's -EF 30-35% with global LV hypokinesis and grade 3 DD per TTE 09/16/2021 -net positive approximately 1400 cc since admission  Filed Weights   10/04/21 1234  Weight: 77.6 kg     CAD status post CABG x3 Continue aspirin beta-blocker and atorvastatin -no chest pain  Family Communication:  Disposition: From home -anticipate return home in 24-48 hours   Objective: Blood pressure 94/70, pulse 70, temperature 97.9 F (36.6 C), resp. rate 16, height '5\' 6"'$  (1.676 m), weight 77.6 kg, SpO2 95 %.  Intake/Output Summary (Last 24 hours) at 10/06/2021 0933 Last data filed at 10/05/2021 1400 Gross per 24 hour  Intake 743.33 ml  Output 125 ml  Net 618.33 ml    Filed Weights   10/04/21 1234  Weight: 77.6 kg    Examination: General: No acute respiratory distress Lungs: Clear to auscultation bilaterally without wheezes or crackles Cardiovascular: RRR without murmur Abdomen: Mildly tender to deep palpation diffusely, no rebound, no mass, soft Extremities: No significant cyanosis, clubbing, or edema bilateral lower extremities  CBC: Recent Labs  Lab 10/04/21 1258 10/05/21 0600 10/06/21 0557  WBC 8.6 4.6 3.8*  NEUTROABS 6.9  --   --   HGB 14.6 13.4 13.3  HCT 44.9 41.3 40.0  MCV  92.2 93.7 91.5  PLT 176 141* 140*    Basic Metabolic Panel: Recent Labs  Lab 10/04/21 1258 10/05/21 0600 10/06/21 0557  NA 135 138 138  K 3.4* 3.1* 3.7  CL 107 111 117*  CO2 19* 20* 17*  GLUCOSE 172* 108* 127*  BUN 24* 23 20  CREATININE 1.58* 1.55* 1.40*  CALCIUM 8.2* 8.0* 7.9*  MG  --   --  1.5*    GFR: Estimated  Creatinine Clearance: 46.1 mL/min (A) (by C-G formula based on SCr of 1.4 mg/dL (H)).  Liver Function Tests: Recent Labs  Lab 10/03/21 1400 10/04/21 1258 10/06/21 0557  AST 58* 64* 50*  ALT 45* 52* 49*  ALKPHOS 70 62 51  BILITOT 1.1 0.7 0.4  PROT 7.7 7.0 5.9*  ALBUMIN 4.2 3.7 3.0*    Recent Labs  Lab 10/03/21 1400  LIPASE 35     HbA1C: Hgb A1c MFr Bld  Date/Time Value Ref Range Status  05/16/2021 07:54 AM 7.8 (H) 4.8 - 5.6 % Final    Comment:    (NOTE) Pre diabetes:          5.7%-6.4%  Diabetes:              >6.4%  Glycemic control for   <7.0% adults with diabetes     CBG: Recent Labs  Lab 10/05/21 0723 10/05/21 1134 10/05/21 1638 10/05/21 2104 10/06/21 0826  GLUCAP 104* 103* 99 106* 123*     Scheduled Meds:  amiodarone  200 mg Oral Daily   apixaban  5 mg Oral BID   aspirin EC  81 mg Oral Daily   insulin aspart  0-15 Units Subcutaneous TID WC   insulin aspart  0-5 Units Subcutaneous QHS   metoprolol succinate  12.5 mg Oral QPM   tamsulosin  0.8 mg Oral QPC supper      LOS: 1 day   Cherene Altes, MD [facility] Office  [phone] Pager - Text Page per Shea Evans  If 7PM-7AM, please contact night-coverage per Amion 10/06/2021, 9:33 AM

## 2021-10-06 NOTE — TOC Initial Note (Signed)
Transition of Care [facility]) - Initial/Assessment Note    Patient Details  Name: Jermaine Kramer MRN: [id number] Date of Birth: 04/16/1951  Transition of Care [facility]) CM/SW Contact:    Donnelly Angelica, LCSW Phone Number: 10/06/2021, 3:23 PM  Clinical Narrative:   CSW met with pt at bedside. Patient states that his PCP is Sena Hitch, MD. Pt states that he does not have any issues getting his medications. [facility] [place] [facility]. Pt does not equipment to get around. Pt states that his wife transports him to his appointments. Pt states that his wife will be transporting him form the hospital upon discharge.   Expected Discharge Plan: Home/Self Care Barriers to Discharge: Continued Medical Work up   Patient Goals and CMS Choice     Choice offered to / list presented to : NA  Expected Discharge Plan and Services Expected Discharge Plan: Home/Self Care       Living arrangements for the past 2 months: Single Family Home                                      Prior Living Arrangements/Services Living arrangements for the past 2 months: Single Family Home Lives with:: Self Patient language and need for interpreter reviewed:: Yes Do you feel safe going back to the place where you live?: Yes      Need for Family Participation in Patient Care: Yes (Comment) Care giver support system in place?: Yes (comment)   Criminal Activity/Legal Involvement Pertinent to Current Situation/Hospitalization: No - Comment as needed  Activities of Daily Living Home Assistive Devices/Equipment: Eyeglasses ADL Screening (condition at time of admission) Patient's cognitive ability adequate to safely complete daily activities?: Yes Is the patient deaf or have difficulty hearing?: No Does the patient have difficulty seeing, even when wearing glasses/contacts?: No Does the patient have difficulty concentrating, remembering, or making decisions?: No Patient able to express  need for assistance with ADLs?: Yes Does the patient have difficulty dressing or bathing?: No Independently performs ADLs?: Yes (appropriate for developmental age) Does the patient have difficulty walking or climbing stairs?: No Weakness of Legs: None Weakness of Arms/Hands: None  Permission Sought/Granted                  Emotional Assessment Appearance:: Appears stated age, Well-Groomed Attitude/Demeanor/Rapport: Engaged Affect (typically observed): Pleasant Orientation: : Oriented to Self, Oriented to  Time, Oriented to Situation, Oriented to Place Alcohol / Substance Use: Never Used Psych Involvement: No (comment)  Admission diagnosis:  Acute gastroenteritis [K52.9] Weakness [R53.1] Rotavirus infection [A08.0] Gastroenteritis and colitis, viral [A08.4] Patient Active Problem List   Diagnosis Date Noted   Gastroenteritis and colitis, viral 10/05/2021   Acute gastroenteritis, rotavirus 10/04/2021   AKI (acute kidney injury) with metabolic acidosis(HCC) 84/13/2440   Diabetes mellitus without complication ([place])    Hypokalemia    Hypotension    Dental caries    Acute pulmonary edema ([place])    Left flank pain 07/24/2021   Stroke ([place]) 07/24/2021   Chronic combined systolic and diastolic congestive heart failure ([place]) 07/24/2021   Atrial fibrillation, chronic ([place]) 07/24/2021   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   Kidney stone on left side 07/24/2021   HFrEF (heart failure with reduced ejection fraction) ([place]) 06/22/2021   Ischemic cardiomyopathy 06/22/2021   CAD s/p CABG x 3 06/22/2021   Hyperlipidemia LDL goal <70 06/22/2021  Acute CHF (congestive heart failure) ([place]) 05/14/2021   Incomplete emptying of bladder 05/04/2021   Acute respiratory failure with hypoxia (HCC)    Troponin I above reference range    COVID 04/21/2021   Postoperative atrial fibrillation ([place]) 04/21/2021   DM (diabetes mellitus), type 2 with coma (HCC)    GERD (gastroesophageal reflux  disease)    Hypertension    Acute parotitis 02/27/2021   Volume overload 02/19/2021   Thrombocytopenia ([place]) 02/18/2021   Receiving inotropic medication 02/18/2021   Postoperative pain 02/18/2021   On enteral nutrition 02/18/2021   Postoperative anemia due to acute blood loss 02/18/2021   Altered mental status 02/18/2021   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   Bradycardia 12/10/2020   Epididymo-orchitis 11/30/2020   Urinary retention 04/09/2020   Obesity (BMI 30.0-34.9) 05/14/2017   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   Renal cyst, acquired, left 12/22/2013   Neoplasm of uncertain behavior of penis 12/22/2013   Malignant neoplasm ([place]) 12/22/2013   History of urinary stone 12/22/2013   BPH (benign prostatic hyperplasia) 12/22/2013   Benign localized hyperplasia of prostate with urinary obstruction and other lower urinary tract symptoms (LUTS)(600.21) 12/22/2013   PCP:  Sherre Scarlet, PA-C [facility]   [id number] [place] [id number]: [phone]: [phone] [id number] [place] [address]: [phone]: [phone]   Social Determinants of Health (SD[place]Interventions    Readmission Risk Interventions    07/27/2021   12:31 PM  Readmission Risk Prevention Plan  Transportation Screening Complete  PCP or Specialist Appt within 3-5 Days Complete  Social Work Consult for [place]/Counseling Complete  Palliative Care Screening Not Applicable  Medication Review (RPress photographerComplete

## 2021-10-07 LAB — MAGNESIUM: Magnesium: 2 mg/dL (ref 1.7–2.4)

## 2021-10-07 LAB — COMPREHENSIVE METABOLIC PANEL
ALT: 42 U/L (ref 0–44)
AST: 36 U/L (ref 15–41)
Albumin: 3 g/dL — ABNORMAL LOW (ref 3.5–5.0)
Alkaline Phosphatase: 56 U/L (ref 38–126)
Anion gap: 3 — ABNORMAL LOW (ref 5–15)
BUN: 15 mg/dL (ref 8–23)
CO2: 19 mmol/L — ABNORMAL LOW (ref 22–32)
Calcium: 8.1 mg/dL — ABNORMAL LOW (ref 8.9–10.3)
Chloride: 118 mmol/L — ABNORMAL HIGH (ref 98–111)
Creatinine, Ser: 1.24 mg/dL (ref 0.61–1.24)
GFR, Estimated: >60 mL/min (ref >60)
Glucose, Bld: 138 mg/dL — ABNORMAL HIGH (ref 70–99)
Potassium: 3.7 mmol/L (ref 3.5–5.1)
Sodium: 140 mmol/L (ref 135–145)
Total Bilirubin: 0.3 mg/dL (ref 0.3–1.2)
Total Protein: 5.8 g/dL — ABNORMAL LOW (ref 6.5–8.1)

## 2021-10-07 LAB — GLUCOSE, CAPILLARY: Glucose-Capillary: 141 mg/dL — ABNORMAL HIGH (ref 70–99)

## 2021-10-07 NOTE — Progress Notes (Signed)
Uneventful PM shift. No bowel movements in PM shift. No c/o abdominal discomfort, N/V overnight.

## 2021-10-07 NOTE — Progress Notes (Signed)
Jermaine Kramer to be D/C'd Home per MD order.  Discussed with the patient and all questions fully answered.  VSS, Skin clean, dry and intact without evidence of skin break down, no evidence of skin tears noted. IV catheter discontinued intact. Site without signs and symptoms of complications. Dressing and pressure applied.  An After Visit Summary was printed and given to the patient.  D/c education completed with patient/family including follow up instructions, medication list, d/c activities limitations if indicated, with other d/c instructions as indicated by MD - patient able to verbalize understanding, all questions fully answered.   Patient instructed to return to ED, call 911, or call MD for any changes in condition.   Patient escorted via [place], and D/C home via private auto.  Manuella Ghazi 10/07/2021 11:52 AM

## 2021-10-07 NOTE — TOC Transition Note (Signed)
Transition of Care [facility]) - CM/SW Discharge Note   Patient Details  Name: Jermaine Kramer MRN: [id number] Date of Birth: August 24, 1950  Transition of Care [facility]) CM/SW Contact:  Candie Chroman, LCSW Phone Number: 10/07/2021, 12:32 PM   Clinical Narrative:   Patient has orders to discharge home today. No further concerns. CSW signing off.  Final next level of care: Home/Self Care Barriers to Discharge: Barriers Resolved   Patient Goals and CMS Choice     Choice offered to / list presented to : NA  Discharge Placement                    Patient and family notified of of transfer: 10/07/21  Discharge Plan and Services                                     Social Determinants of Health (SDOH) Interventions     Readmission Risk Interventions    07/27/2021   12:31 PM  Readmission Risk Prevention Plan  Transportation Screening Complete  PCP or Specialist Appt within 3-5 Days Complete  Social Work Consult for [place]/Counseling Complete  Palliative Care Screening Not Applicable  Medication Review Press photographer) Complete

## 2021-10-07 NOTE — Discharge Summary (Signed)
DISCHARGE SUMMARY  Jermaine Kramer  MR#: [id number]  DOB:1950/11/08  Date of Admission: 10/04/2021 Date of Discharge: 10/07/2021  Attending Physician:Shirlena Brinegar Hennie Duos, MD  Patient's XTK:WIOXBD, Viona Gilmore, PA-C  Consults: None  Disposition: Discharge home  Follow-up Appts:  Follow-up Information     Sherre Scarlet, PA-C Follow up.   Specialty: Physician Assistant Contact information: [address] [place] [address] [phone]                 Tests Needing Follow-up: -Assess volume status  Discharge Diagnoses: Acute Rotavirus gastroenteritis Hypotension due to hypovolemia Acute kidney insult Hypomagnesemia Transaminitis Hypokalemia DM2 Chronic atrial fibrillation Chronic systolic CHF CAD status post CABG x3  Initial presentation: 71 year old with a history of chronic systolic CHF (EF 22%), CAD status post CABG October 2022, atrial fibrillation, AICD March 2023, DM2, and HTN who required hospitalization 5/18 > 5/20 due to a CHF exacerbation and returned to the ER for the second time in 2 days 10/04/21 with severe nausea diarrhea and poor oral intake.  On his initial visit he was found to be positive for rotavirus and a CT abdomen and pelvis was negative.  At that time he declined recommendation for IV hydration.  He returned to the ER 6/6 with protracted weakness and persisting symptoms and this time was willing to follow medical advice.  In the ER he was found to be hypotensive at 71/52.  Creatinine was noted to be elevated at 1.58 from a baseline of 1.15.  Hospital Course:  Acute Rotavirus gastroenteritis C. difficile negative -exercised great care with volume resuscitation given significant CHF -IV fluid support discontinued 6/8 and patient was able to maintain system oral intake of solids and liquids -diarrhea resolved by 6/9 -cleared for discharge home   Hypotension due to hypovolemia In the setting of gastroenteritis with increased GI losses and  poor intake -blood pressure improved after volume resuscitation -resumed usual home medications at time of discharge   Acute kidney insult Prerenal due to significant dehydration -creatinine normalized at time of discharge   Hypomagnesemia Due to diminished intake and GI loss -corrected with supplementation and normal at time of discharge   Transaminitis Likely mild shock liver in setting of hypotension -LFTs normalized with stabilization of blood pressure   Hypokalemia Due to increased GI losses -corrected with supplementation   DM2 CBG well controlled - A1c 7.8   Chronic atrial fibrillation Continue his usual amiodarone and apixaban   Chronic systolic CHF As noted above patient actually dehydrated at time of presentation - held many of his usual medications in the setting of hypotension during initial portion of hospitalization - EF 30-35% with global LV hypokinesis and grade 3 DD per TTE 09/16/2021 - net positive approximately 1400 cc since admission, with no gross evidence of volume overload and patient appearing euvolemic on clinical exam at time of discharge -usual home heart failure medications resumed at time of discharge  CAD status post CABG x3 Continue aspirin beta-blocker and atorvastatin -no chest pain during this admission    Allergies as of 10/07/2021       Reactions   Doxycycline Anaphylaxis   Patient does not recall having this reaction.    Heparin Other (See Comments)   heparin-induced thrombocytopenia   Empagliflozin Itching   Groin infection Caused a groin infection.         Medication List     STOP taking these medications    lactulose 10 GM/15ML solution Commonly known as: CHRONULAC  polyethylene glycol 17 g packet Commonly known as: MIRALAX / GLYCOLAX       TAKE these medications    amiodarone 200 MG tablet Commonly known as: PACERONE Take 200 mg by mouth daily.   apixaban 5 MG Tabs tablet Commonly known as: ELIQUIS Take 1 tablet (5  mg total) by mouth 2 (two) times daily. DO NOT RESTART UNTIL 06/27/2021.   aspirin EC 81 MG tablet Take 81 mg by mouth daily.   atorvastatin 80 MG tablet Commonly known as: LIPITOR Take 80 mg by mouth daily.   metFORMIN 1000 MG tablet Commonly known as: GLUCOPHAGE Take 1,000 mg by mouth 2 (two) times daily with a meal.   metoprolol succinate 25 MG 24 hr tablet Commonly known as: TOPROL-XL Take 12.5 mg by mouth every evening.   omeprazole 40 MG capsule Commonly known as: PRILOSEC Take 40 mg by mouth daily.   ondansetron 4 MG tablet Commonly known as: ZOFRAN Take 4 mg by mouth every 8 (eight) hours as needed.   Precision QID Test test strip Generic drug: glucose blood Use 3 (three) times daily Use as instructed. One touch ultra   spironolactone 25 MG tablet Commonly known as: ALDACTONE Take 12.5 mg by mouth daily.   tamsulosin 0.4 MG Caps capsule Commonly known as: FLOMAX Take 0.8 mg by mouth in the morning and at bedtime.   torsemide 20 MG tablet Commonly known as: DEMADEX Take 1 tablet (20 mg total) by mouth daily. And additional '20mg'$  if needed        Day of Discharge BP 107/74 (BP Location: Left Arm)   Pulse 71   Temp (!) 97.5 F (36.4 C) (Oral)   Resp 16   Ht '5\' 6"'$  (1.676 m)   Wt 77.6 kg   SpO2 97%   BMI 27.60 kg/m   Physical Exam: General: No acute respiratory distress Lungs: Clear to auscultation bilaterally without wheezes or crackles Cardiovascular: Regular rate and rhythm without murmur gallop or rub normal S1 and S2 Abdomen: Nontender, nondistended, soft, bowel sounds positive, no rebound, no ascites, no appreciable mass Extremities: No significant cyanosis, clubbing, or edema bilateral lower extremities  Basic Metabolic Panel: Recent Labs  Lab 10/03/21 1400 10/04/21 1258 10/05/21 0600 10/06/21 0557 10/07/21 0601  NA 137 135 138 138 140  K 3.7 3.4* 3.1* 3.7 3.7  CL 107 107 111 117* 118*  CO2 20* 19* 20* 17* 19*  GLUCOSE 124* 172* 108*  127* 138*  BUN 27* 24* '23 20 15  '$ CREATININE 1.61* 1.58* 1.55* 1.40* 1.24  CALCIUM 8.9 8.2* 8.0* 7.9* 8.1*  MG  --   --   --  1.5* 2.0    CBC: Recent Labs  Lab 10/03/21 1400 10/04/21 1258 10/05/21 0600 10/06/21 0557  WBC 8.7 8.6 4.6 3.8*  NEUTROABS  --  6.9  --   --   HGB 15.7 14.6 13.4 13.3  HCT 47.4 44.9 41.3 40.0  MCV 92.0 92.2 93.7 91.5  PLT 197 176 141* 140*    Recent Results (from the past 240 hour(s))  Gastrointestinal Panel by PCR , Stool     Status: Abnormal   Collection Time: 10/03/21  4:56 PM   Specimen: Stool  Result Value Ref Range Status   Campylobacter species NOT DETECTED NOT DETECTED Final   Plesimonas shigelloides NOT DETECTED NOT DETECTED Final   Salmonella species NOT DETECTED NOT DETECTED Final   Yersinia enterocolitica NOT DETECTED NOT DETECTED Final   Vibrio species NOT DETECTED NOT DETECTED Final  Vibrio cholerae NOT DETECTED NOT DETECTED Final   Enteroaggregative E coli (EAEC) NOT DETECTED NOT DETECTED Final   Enteropathogenic E coli (EPEC) NOT DETECTED NOT DETECTED Final   Enterotoxigenic E coli (ETEC) NOT DETECTED NOT DETECTED Final   Shiga like toxin producing E coli (STEC) NOT DETECTED NOT DETECTED Final   Shigella/Enteroinvasive E coli (EIEC) NOT DETECTED NOT DETECTED Final   Cryptosporidium NOT DETECTED NOT DETECTED Final   Cyclospora cayetanensis NOT DETECTED NOT DETECTED Final   Entamoeba histolytica NOT DETECTED NOT DETECTED Final   Giardia lamblia NOT DETECTED NOT DETECTED Final   Adenovirus F40/41 NOT DETECTED NOT DETECTED Final   Astrovirus NOT DETECTED NOT DETECTED Final   Norovirus GI/GII NOT DETECTED NOT DETECTED Final   Rotavirus A DETECTED (A) NOT DETECTED Final   Sapovirus (I, II, IV, and V) NOT DETECTED NOT DETECTED Final    Comment: Performed at [facility], [address] [place], [place] [address]  C Difficile Quick Screen w PCR reflex     Status: None   Collection Time: 10/03/21  4:56 PM   Specimen: Stool   Result Value Ref Range Status   C Diff antigen NEGATIVE NEGATIVE Final   C Diff toxin NEGATIVE NEGATIVE Final   C Diff interpretation No C. difficile detected.  Final    Comment: Performed at [facility], [address] [place], [place] [address]     Time spent in discharge (includes decision making & examination of pt): 35 minutes  10/07/2021, 2:58 PM   Cherene Altes, MD [facility] Office  [phone]

## 2021-10-10 NOTE — Progress Notes (Signed)
Patient ID: Jermaine Kramer, male    DOB: 1950/10/08, 71 y.o.   MRN: [id number]  HPI  Jermaine Kramer is a 71 y/o male with a history of leukemia, DM, CAD (CABG 02/16/21), AF, sleep apnea, hyperlipidemia, HTN, stroke, GERD & chronic heart failure.   Echo report from 09/16/21 reviewed and showed an EF of 30-35% along with mild Jermaine. Echo report from 05/16/21 reviewed and showed and EF of <20% along with mild/moderate LAE and mild/moderate Jermaine.   Admitted 10/04/21 due to severe nausea diarrhea and poor oral intake. IVF hydration gently done. Hypokalemia corrected. Able to keep PO fluids done and he was released. Discharged after 3 days. Was in the ED 10/03/21 due to nausea, vomiting and diarrhea along with abdominal pain. IVF given. Abdominal CT negative. + for rotavirus. Felt better and he was released. Admitted 09/15/21 due to worsening SOB and weight gain due to acute on chronic HF. Given IV lasix with transition to oral diuretics. Cardiology consult obtained. ACEi deferred to renal function. Discharged after 2 days. Admitted 07/24/21 due to  worsening left flank pain today, which is constant, sharp, 8 out of 10 in severity, nonradiating. CT of lumbar spine is negative for acute injury, but showed degenerative disc disease.  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 Urology and GI consults obtained. Discharged after 3 days. Was in the ED 07/21/21 due to left sided flank pain. CT as positive for a kidney stone that is in the left kidney, nonobstructing. He was released with pain medication. AICD implanted 06/22/21. Admitted 05/14/21 due to lower extremity edema, increasing shortness of breath, cough and wheezing. Initially given IV lasix with transition to oral diuretics. Cardiology consult obtained. Discharged after 4 days.   He presents today for a follow-up visit with a chief complaint of moderate fatigue upon minimal exertion. He describes this as chronic in nature. He has  associated cough, shortness of breath, abdominal distention (improving) and weakness (improving) along with this. He denies any difficulty sleeping, dizziness, palpitations, pedal edema, chest pain, wheezing or weight gain.   Did not use the furoscix device as he ended up with rotavirus with lots of GI symptoms resulting in an ED visit and then admission. Did have insurance call her and tell her it would be >$700 for the furoscix. Both patient and wife feel like the diuretic change to torsemide is helping better than the furosemide did.   Past Medical History:  Diagnosis Date   Arrhythmia    atrial fibrillation   CHF (congestive heart failure) (HCC)    Coronary artery disease    Diabetes mellitus without complication (HCC)    GERD (gastroesophageal reflux disease)    Hypercholesteremia    Hypertension    Sleep apnea    Stroke [facility]    Past Surgical History:  Procedure Laterality Date   CHOLECYSTECTOMY     COLONOSCOPY WITH PROPOFOL N/A 04/14/2019   Procedure: COLONOSCOPY WITH PROPOFOL;  Surgeon: Toledo, Benay Pike, MD;  Location: [facility] ENDOSCOPY;  Service: Gastroenterology;  Laterality: N/A;   CORONARY ARTERY BYPASS GRAFT  02/16/2021   ICD IMPLANT N/A 06/22/2021   Procedure: ICD IMPLANT;  Surgeon: Vickie Epley, MD;  Location: [facility];  Service: Cardiovascular;  Laterality: N/A;   KNEE ARTHROSCOPY     RENAL CYST EXCISION     Family History  Problem Relation Age of Onset   Parkinson's disease Mother    Lupus Mother    Heart disease  Father    Social History   Tobacco Use   Smoking status: Never   Smokeless tobacco: Never  Substance Use Topics   Alcohol use: Not Currently   Allergies  Allergen Reactions   Doxycycline Anaphylaxis    Patient does not recall having this reaction.     Heparin Other (See Comments)    heparin-induced thrombocytopenia    Empagliflozin Itching    Groin infection Caused a groin infection.     Prior to Admission medications    Medication Sig Start Date End Date Taking? Authorizing Provider  amiodarone (PACERONE) 200 MG tablet Take 200 mg by mouth daily. 03/06/21  Yes [provider]  apixaban (ELIQUIS) 5 MG TABS tablet Take 1 tablet (5 mg total) by mouth 2 (two) times daily. DO NOT RESTART UNTIL 06/27/2021. 06/22/21  Yes Dunn, Areta Haber, PA-C  aspirin EC 81 MG tablet Take 81 mg by mouth daily.   Yes [provider]  atorvastatin (LIPITOR) 80 MG tablet Take 80 mg by mouth daily. 02/10/21  Yes [provider]  glucose blood (PRECISION QID TEST) test strip Use 3 (three) times daily Use as instructed. One touch ultra 07/05/21 07/05/22 Yes [provider]  metFORMIN (GLUCOPHAGE) 1000 MG tablet Take 1,000 mg by mouth 2 (two) times daily with a meal.   Yes [provider]  omeprazole (PRILOSEC) 40 MG capsule Take 40 mg by mouth daily. 03/11/21  Yes [provider]  ondansetron (ZOFRAN) 4 MG tablet Take 4 mg by mouth every 8 (eight) hours as needed. 09/13/21  Yes [provider]  spironolactone (ALDACTONE) 25 MG tablet Take 12.5 mg by mouth daily. 09/20/21  Yes [provider]  torsemide (DEMADEX) 20 MG tablet Take 1 tablet (20 mg total) by mouth daily. And additional '20mg'$  if needed 09/30/21  Yes Darylene Price A, FNP  metoprolol succinate (TOPROL-XL) 25 MG 24 hr tablet Take 0.5 tablets (12.5 mg total) by mouth every evening. 10/11/21   Alisa Graff, FNP  tamsulosin (FLOMAX) 0.4 MG CAPS capsule Take 0.8 mg by mouth in the morning and at bedtime. Patient not taking: Reported on 10/11/2021    [provider]    Review of Systems  Constitutional:  Positive for fatigue (easily). Negative for appetite change.  HENT:  Negative for congestion, postnasal drip and sore throat.   Eyes: Negative.   Respiratory:  Positive for cough and shortness of breath (easily). Negative for chest tightness and wheezing.   Cardiovascular:  Negative for chest pain, palpitations and  leg swelling.  Gastrointestinal:  Positive for abdominal distention (iimproving). Negative for abdominal pain.  Endocrine: Negative.   Genitourinary: Negative.   Musculoskeletal:  Negative for back pain and neck pain.  Skin: Negative.   Allergic/Immunologic: Negative.   Neurological:  Positive for weakness. Negative for dizziness and light-headedness.  Hematological:  Negative for adenopathy. Does not bruise/bleed easily.  Psychiatric/Behavioral:  Negative for dysphoric mood and sleep disturbance (sleeping on 2 pillows). The patient is not nervous/anxious.    Vitals:   10/11/21 1444  BP: 106/63  Pulse: 71  Resp: 18  SpO2: 99%  Weight: 181 lb 2 oz (82.2 kg)  Height: '5\' 6"'$  (1.676 m)   Wt Readings from Last 3 Encounters:  10/11/21 181 lb 2 oz (82.2 kg)  10/04/21 171 lb (77.6 kg)  10/03/21 171 lb (77.6 kg)   Lab Results  Component Value Date   CREATININE 1.24 10/07/2021   CREATININE 1.40 (H) 10/06/2021   CREATININE 1.55 (H)  10/05/2021   Physical Exam Vitals and nursing note reviewed. Exam conducted with a chaperone present (wife).  Constitutional:      Appearance: Normal appearance.  HENT:     Head: Normocephalic and atraumatic.  Cardiovascular:     Rate and Rhythm: Normal rate and regular rhythm.  Pulmonary:     Effort: Pulmonary effort is normal. No respiratory distress.     Breath sounds: No wheezing or rales.  Abdominal:     General: There is distension.     Palpations: Abdomen is soft.     Tenderness: There is no abdominal tenderness.  Musculoskeletal:        General: No tenderness.     Cervical back: Normal range of motion and neck supple.     Right lower leg: No edema.     Left lower leg: No edema.  Skin:    General: Skin is warm and dry.  Neurological:     General: No focal deficit present.     Mental Status: He is alert and oriented to person, place, and time.  Psychiatric:        Mood and Affect: Mood normal.        Behavior: Behavior normal.         Thought Content: Thought content normal.    Assessment & Plan:  1: Chronic heart failure with reduced ejection fraction- - NYHA class III - euvolemic today - weighing daily and home weight chart reviewed; quite a bit of weight loss with recent GI virus; reminded to call for an overnight weight gain of > 2 pounds or a weekly weight gain of >5 pounds - weight down 6 pounds from last visit here 11 days ago - not adding "much" salt and has been reading food labels for sodium content; understands to keep daily sodium intake to '2000mg'$  / day - on GDMT of metoprolol and spironolactone; lisinopril still on hold - allergic to empagliflozin with a yeast infection - saw EP Quentin Ore) 09/21/21  - had AICD implanted 06/22/21; no shocks have been delivered - they will call if they have to use the furoscix - will check BMP/BNP today - BNP 09/28/21 was 1480.2  2: HTN- - BP looks good (106/63) - saw PCP [name]) 08/03/21 - BMP 10/07/21 reviewed and showed sodium 140, potassium 3.7, creatinine 1.24 & GFR >60  3: DM- - A1c 08/03/21 was 7.3% - glucose at home today was 117  4: Atrial fibrillation- - saw cardiology Corky Sox) 09/20/21 - currently on amiodarone, metoprolol and apixaban  5: CAD- - CABG with a LIMA to LAD, SVG to ramus, and SVG to PDA on 02/16/21    Medication bottles reviewed.   Return in 1 month, sooner if needed.

## 2021-10-11 ENCOUNTER — Ambulatory Visit: Payer: Medicare Other | Attending: Family | Admitting: Family

## 2021-10-11 ENCOUNTER — Encounter: Payer: Self-pay | Admitting: Family

## 2021-10-11 VITALS — BP 106/63 | HR 71 | Resp 18 | Ht 66.0 in | Wt 181.1 lb

## 2021-10-11 DIAGNOSIS — I5022 Chronic systolic (congestive) heart failure: Secondary | ICD-10-CM | POA: Insufficient documentation

## 2021-10-11 DIAGNOSIS — I4891 Unspecified atrial fibrillation: Secondary | ICD-10-CM | POA: Insufficient documentation

## 2021-10-11 DIAGNOSIS — E1122 Type 2 diabetes mellitus with diabetic chronic kidney disease: Secondary | ICD-10-CM | POA: Diagnosis not present

## 2021-10-11 DIAGNOSIS — Z95810 Presence of automatic (implantable) cardiac defibrillator: Secondary | ICD-10-CM | POA: Diagnosis not present

## 2021-10-11 DIAGNOSIS — I480 Paroxysmal atrial fibrillation: Secondary | ICD-10-CM

## 2021-10-11 DIAGNOSIS — Z8673 Personal history of transient ischemic attack (TIA), and cerebral infarction without residual deficits: Secondary | ICD-10-CM | POA: Insufficient documentation

## 2021-10-11 DIAGNOSIS — K219 Gastro-esophageal reflux disease without esophagitis: Secondary | ICD-10-CM | POA: Diagnosis not present

## 2021-10-11 DIAGNOSIS — Z951 Presence of aortocoronary bypass graft: Secondary | ICD-10-CM | POA: Insufficient documentation

## 2021-10-11 DIAGNOSIS — Z79899 Other long term (current) drug therapy: Secondary | ICD-10-CM | POA: Insufficient documentation

## 2021-10-11 DIAGNOSIS — R5383 Other fatigue: Secondary | ICD-10-CM | POA: Diagnosis not present

## 2021-10-11 DIAGNOSIS — Z7902 Long term (current) use of antithrombotics/antiplatelets: Secondary | ICD-10-CM | POA: Diagnosis not present

## 2021-10-11 DIAGNOSIS — I255 Ischemic cardiomyopathy: Secondary | ICD-10-CM | POA: Diagnosis not present

## 2021-10-11 DIAGNOSIS — Z7984 Long term (current) use of oral hypoglycemic drugs: Secondary | ICD-10-CM | POA: Insufficient documentation

## 2021-10-11 DIAGNOSIS — G4730 Sleep apnea, unspecified: Secondary | ICD-10-CM | POA: Diagnosis not present

## 2021-10-11 DIAGNOSIS — I10 Essential (primary) hypertension: Secondary | ICD-10-CM | POA: Diagnosis not present

## 2021-10-11 DIAGNOSIS — Z7901 Long term (current) use of anticoagulants: Secondary | ICD-10-CM | POA: Insufficient documentation

## 2021-10-11 DIAGNOSIS — E785 Hyperlipidemia, unspecified: Secondary | ICD-10-CM | POA: Insufficient documentation

## 2021-10-11 DIAGNOSIS — N1832 Chronic kidney disease, stage 3b: Secondary | ICD-10-CM

## 2021-10-11 DIAGNOSIS — E119 Type 2 diabetes mellitus without complications: Secondary | ICD-10-CM | POA: Insufficient documentation

## 2021-10-11 DIAGNOSIS — I110 Hypertensive heart disease with heart failure: Secondary | ICD-10-CM | POA: Diagnosis not present

## 2021-10-11 DIAGNOSIS — I2510 Atherosclerotic heart disease of native coronary artery without angina pectoris: Secondary | ICD-10-CM | POA: Diagnosis not present

## 2021-10-11 LAB — BASIC METABOLIC PANEL
Anion gap: 9 (ref 5–15)
BUN: 26 mg/dL — ABNORMAL HIGH (ref 8–23)
CO2: 25 mmol/L (ref 22–32)
Calcium: 8.3 mg/dL — ABNORMAL LOW (ref 8.9–10.3)
Chloride: 106 mmol/L (ref 98–111)
Creatinine, Ser: 1.67 mg/dL — ABNORMAL HIGH (ref 0.61–1.24)
GFR, Estimated: 43 mL/min — ABNORMAL LOW (ref >60)
Glucose, Bld: 158 mg/dL — ABNORMAL HIGH (ref 70–99)
Potassium: 3.7 mmol/L (ref 3.5–5.1)
Sodium: 140 mmol/L (ref 135–145)

## 2021-10-11 MED ORDER — METOPROLOL SUCCINATE ER 25 MG PO TB24
12.5000 mg | ORAL_TABLET | Freq: Every evening | ORAL | 3 refills | Status: DC
Start: 2021-10-11 — End: 2022-07-10

## 2021-10-11 NOTE — Patient Instructions (Signed)
Continue weighing daily and call for an overnight weight gain of 3 pounds or more or a weekly weight gain of more than 5 pounds  If you have voicemail, please make sure your mailbox is cleaned out so that we may leave a message and please make sure to listen to any voicemails.    If you receive a satisfaction survey regarding the [facility], please take the time to fill it out. This way we can continue to provide excellent care and make any changes that need to be made.     

## 2021-10-12 ENCOUNTER — Telehealth: Payer: Self-pay

## 2021-10-12 ENCOUNTER — Encounter: Payer: Self-pay | Admitting: *Deleted

## 2021-10-12 DIAGNOSIS — Z951 Presence of aortocoronary bypass graft: Secondary | ICD-10-CM

## 2021-10-12 NOTE — Telephone Encounter (Signed)
Spoke with patient's wife to inform them that after speaking with the hospital's lab department, we learned the BNP test was not drawn yesterday and the patient would unfortunately have to come back in for another lab draw if they still wanted the test. Per Darylene Price, FNP, test is not strictly necessary at this time with the improvement of patient's symptoms. Patient's wife declined. She will call if any questions or symptoms arise.   Georg Ruddle, RN

## 2021-10-12 NOTE — Telephone Encounter (Addendum)
Lab results and message below relayed to patient's wife. She inquired about BNP results, but test is still pending. Informed her we will call again when those results are available. No further questions. Georg Ruddle, RN ----- Message from Alisa Graff, [place] sent at 10/12/2021  8:28 AM EDT ----- Sodium/ potassium levels are normal. Kidney function is a little worse from hospital discharge but within your range of the last couple of weeks. Will recheck this at your next visit

## 2021-10-12 NOTE — Progress Notes (Signed)
Cardiac Individual Treatment Plan  Patient Details  Name: Jermaine Kramer MRN: [id number] Date of Birth: 1950-05-12 Referring Provider:   Flowsheet Row Cardiac Rehab from 07/19/2021 in [facility] Cardiac and Pulmonary Rehab  Referring Provider Ernesta Amble MD       Initial Encounter Date:  Flowsheet Row Cardiac Rehab from 07/19/2021 in [facility] Cardiac and Pulmonary Rehab  Date 07/19/21       Visit Diagnosis: S/P CABG x 3  Patient's Home Medications on Admission:  Current Outpatient Medications:    amiodarone (PACERONE) 200 MG tablet, Take 200 mg by mouth daily., Disp: , Rfl:    apixaban (ELIQUIS) 5 MG TABS tablet, Take 1 tablet (5 mg total) by mouth 2 (two) times daily. DO NOT RESTART UNTIL 06/27/2021., Disp: 60 tablet, Rfl:    aspirin EC 81 MG tablet, Take 81 mg by mouth daily., Disp: , Rfl:    atorvastatin (LIPITOR) 80 MG tablet, Take 80 mg by mouth daily., Disp: , Rfl:    glucose blood (PRECISION QID TEST) test strip, Use 3 (three) times daily Use as instructed. One touch ultra, Disp: , Rfl:    metFORMIN (GLUCOPHAGE) 1000 MG tablet, Take 1,000 mg by mouth 2 (two) times daily with a meal., Disp: , Rfl:    metoprolol succinate (TOPROL-XL) 25 MG 24 hr tablet, Take 0.5 tablets (12.5 mg total) by mouth every evening., Disp: 45 tablet, Rfl: 3   omeprazole (PRILOSEC) 40 MG capsule, Take 40 mg by mouth daily., Disp: , Rfl:    ondansetron (ZOFRAN) 4 MG tablet, Take 4 mg by mouth every 8 (eight) hours as needed., Disp: , Rfl:    spironolactone (ALDACTONE) 25 MG tablet, Take 12.5 mg by mouth daily., Disp: , Rfl:    tamsulosin (FLOMAX) 0.4 MG CAPS capsule, Take 0.8 mg by mouth in the morning and at bedtime. (Patient not taking: Reported on 10/11/2021), Disp: , Rfl:    torsemide (DEMADEX) 20 MG tablet, Take 1 tablet (20 mg total) by mouth daily. And additional $RemoveBefore'20mg'[id number]$  if needed, Disp: 40 tablet, Rfl: 3  Past Medical History: Past Medical History:  Diagnosis Date   Arrhythmia    atrial fibrillation    CHF (congestive heart failure) (HCC)    Coronary artery disease    Diabetes mellitus without complication (HCC)    GERD (gastroesophageal reflux disease)    Hypercholesteremia    Hypertension    Sleep apnea    Stroke ([place])     Tobacco Use: Social History   Tobacco Use  Smoking Status Never  Smokeless Tobacco Never    Labs: Review Flowsheet       Latest Ref Rng & Units 04/21/2021 05/16/2021  Labs for ITP Cardiac and Pulmonary Rehab  Hemoglobin A1c 4.8 - 5.6 % - 7.8   Bicarbonate 20.0 - 28.0 mmol/L 23.2  -  Acid-base deficit 0.0 - 2.0 mmol/L 0.6  -  O2 Saturation % 97.4  -     Exercise Target Goals: Exercise Program Goal: Individual exercise prescription set using results from initial 6 min walk test and THRR while considering  patient's activity barriers and safety.   Exercise Prescription Goal: Initial exercise prescription builds to 30-45 minutes a day of aerobic activity, 2-3 days per week.  Home exercise guidelines will be given to patient during program as part of exercise prescription that the participant will acknowledge.   Education: Aerobic Exercise: - Group verbal and visual presentation on the components of exercise prescription. Introduces F.I.T.T principle from ACSM for exercise prescriptions.  Reviews F.I.T.T. principles of aerobic exercise including progression. Written material given at graduation. Flowsheet Row Cardiac Rehab from 07/19/2021 in [facility] Cardiac and Pulmonary Rehab  Education need identified 07/19/21       Education: Resistance Exercise: - Group verbal and visual presentation on the components of exercise prescription. Introduces F.I.T.T principle from ACSM for exercise prescriptions  Reviews F.I.T.T. principles of resistance exercise including progression. Written material given at graduation.    Education: Exercise & Equipment Safety: - Individual verbal instruction and demonstration of equipment use and safety with use of the  equipment. Flowsheet Row Cardiac Rehab from 07/11/2021 in [facility] Cardiac and Pulmonary Rehab  Date 07/11/21  Educator [facility]  Instruction Review Code 1- Verbalizes Understanding       Education: Exercise Physiology & General Exercise Guidelines: - Group verbal and written instruction with models to review the exercise physiology of the cardiovascular system and associated critical values. Provides general exercise guidelines with specific guidelines to those with heart or lung disease.  Flowsheet Row Cardiac Rehab from 07/19/2021 in [facility] Cardiac and Pulmonary Rehab  Education need identified 07/19/21       Education: Flexibility, Balance, Mind/Body Relaxation: - Group verbal and visual presentation with interactive activity on the components of exercise prescription. Introduces F.I.T.T principle from ACSM for exercise prescriptions. Reviews F.I.T.T. principles of flexibility and balance exercise training including progression. Also discusses the mind body connection.  Reviews various relaxation techniques to help reduce and manage stress (i.e. Deep breathing, progressive muscle relaxation, and visualization). Balance handout provided to take home. Written material given at graduation.   Activity Barriers & Risk Stratification:  Activity Barriers & Cardiac Risk Stratification - 07/19/21 1442       Activity Barriers & Cardiac Risk Stratification   Activity Barriers Decreased Ventricular Function;Other (comment);Deconditioning    Comments Per patient, weight restrictions until 4/6- recent ICD placement    Cardiac Risk Stratification High             6 Minute Walk:  6 Minute Walk     Row Name 07/19/21 1458         6 Minute Walk   Phase Initial     Distance 1225 feet     Walk Time 6 minutes     # of Rest Breaks 0     MPH 2.32     METS 2.76     RPE 11     Perceived Dyspnea  1     VO2 Peak 9.66     Symptoms Yes (comment)     Comments SOB, legs fatigued     Resting HR 75 bpm      Resting BP 104/64     Resting Oxygen Saturation  97 %     Exercise Oxygen Saturation  during 6 min walk 95 %     Max Ex. HR 111 bpm     Max Ex. BP 140/68     2 Minute Post BP 108/68              Oxygen Initial Assessment:   Oxygen Re-Evaluation:   Oxygen Discharge (Final Oxygen Re-Evaluation):   Initial Exercise Prescription:  Initial Exercise Prescription - 07/19/21 1500       Date of Initial Exercise RX and Referring Provider   Date 07/19/21    Referring Provider Ernesta Amble MD      Oxygen   Maintain Oxygen Saturation 88% or higher      Treadmill   MPH 2.1    Grade  0.5    Minutes 15    METs 2.75      Recumbant Bike   Level 2    RPM 60    Watts 20    Minutes 15    METs 2.7      REL-XR   Level 1    Speed 50    Minutes 15    METs 2.7      Prescription Details   Frequency (times per week) 2    Duration Progress to 30 minutes of continuous aerobic without signs/symptoms of physical distress      Intensity   THRR 40-80% of Max Heartrate 104-133    Ratings of Perceived Exertion 11-13    Perceived Dyspnea 0-4      Progression   Progression Continue to progress workloads to maintain intensity without signs/symptoms of physical distress.      Resistance Training   Training Prescription Yes    Weight 3 lb   ROM until 4//6: Recent ICD placement   Reps 10-15             Perform Capillary Blood Glucose checks as needed.  Exercise Prescription Changes:   Exercise Prescription Changes     Row Name 07/19/21 1500 08/09/21 1600 08/22/21 0900 08/29/21 1000 09/05/21 1500     Response to Exercise   Blood Pressure (Admit) 104/64 118/60 136/64 -- 110/62   Blood Pressure (Exercise) 140/68 150/72 134/74 -- 122/64   Blood Pressure (Exit) 108/68 102/54 104/64 -- 102/58   Heart Rate (Admit) 75 bpm 93 bpm 94 bpm -- 82 bpm   Heart Rate (Exercise) 111 bpm 118 bpm 114 bpm -- 119 bpm   Heart Rate (Exit) 79 bpm 101 bpm 94 bpm -- 89 bpm   Oxygen  Saturation (Admit) 97 % -- -- -- --   Oxygen Saturation (Exercise) 95 % -- -- -- --   Rating of Perceived Exertion (Exercise) _0 -- 13   Perceived Dyspnea (Exercise) 1 -- -- -- --   Symptoms SOB, legs fatigued none none -- none   Comments walk test results 2nd full day of exercise -- -- --   Duration -- Progress to 30 minutes of  aerobic without signs/symptoms of physical distress Continue with 30 min of aerobic exercise without signs/symptoms of physical distress. -- Continue with 30 min of aerobic exercise without signs/symptoms of physical distress.   Intensity -- THRR unchanged THRR unchanged -- THRR unchanged     Progression   Progression -- Continue to progress workloads to maintain intensity without signs/symptoms of physical distress. Continue to progress workloads to maintain intensity without signs/symptoms of physical distress. -- Continue to progress workloads to maintain intensity without signs/symptoms of physical distress.   Average METs -- 2.6 3.01 -- 3.81     Resistance Training   Training Prescription -- Yes Yes -- Yes   Weight -- 3 lb 3 lb -- 3 lb   Reps -- 10-15 10-15 -- 10-15     Interval Training   Interval Training -- No No -- No     Treadmill   MPH -- 2.1 2.1 -- 2.3   Grade -- 0.5 0.5 -- 0.5   Minutes -- 15 15 -- 15   METs -- 2.75 2.75 -- 2.92     Recumbant Bike   Level -- 2 3 -- 2   Watts -- 36 27 -- --   Minutes -- 15 15 -- 15   METs -- -- 2.29 -- --  NuStep   Level -- -- -- -- 4   Minutes -- -- -- -- 15   METs -- -- -- -- 4     REL-XR   Level -- 2 3 -- 3   Minutes -- 15 15 -- 15   METs -- 5.1 4.4 -- 4.5     Home Exercise Plan   Plans to continue exercise at -- -- -- Home (comment)  walking, staff vidoes Home (comment)  walking, staff vidoes   Frequency -- -- -- Add 2 additional days to program exercise sessions. Add 2 additional days to program exercise sessions.   Initial Home Exercises Provided -- -- -- 08/29/21 08/29/21     Oxygen    Maintain Oxygen Saturation -- 88% or higher 88% or higher -- 88% or higher    Row Name 09/22/21 1400             Response to Exercise   Blood Pressure (Admit) 110/62       Blood Pressure (Exercise) 130/68       Blood Pressure (Exit) 108/64       Heart Rate (Admit) 87 bpm       Heart Rate (Exercise) 118 bpm       Heart Rate (Exit) 92 bpm       Rating of Perceived Exertion (Exercise) 13       Symptoms none       Duration Continue with 30 min of aerobic exercise without signs/symptoms of physical distress.       Intensity THRR unchanged         Progression   Progression Continue to progress workloads to maintain intensity without signs/symptoms of physical distress.       Average METs 3.41         Resistance Training   Training Prescription Yes       Weight 3 lb       Reps 10-15         Interval Training   Interval Training No         Treadmill   MPH 2.4       Grade 1       Minutes 15       METs 3.17         Recumbant Bike   Level 2       Watts 25       Minutes 15       METs 2.72         NuStep   Level 4       Minutes 15       METs 3.7         REL-XR   Level 3       Minutes 15       METs 4.9         Home Exercise Plan   Plans to continue exercise at Home (comment)  walking, staff vidoes       Frequency Add 2 additional days to program exercise sessions.       Initial Home Exercises Provided 08/29/21         Oxygen   Maintain Oxygen Saturation 88% or higher                Exercise Comments:   Exercise Goals and Review:   Exercise Goals     Row Name 07/19/21 1530             Exercise Goals   Increase Physical  Activity Yes       Intervention Provide advice, education, support and counseling about physical activity/exercise needs.;Develop an individualized exercise prescription for aerobic and resistive training based on initial evaluation findings, risk stratification, comorbidities and participant's personal goals.       Expected  Outcomes Short Term: Attend rehab on a regular basis to increase amount of physical activity.;Long Term: Add in home exercise to make exercise part of routine and to increase amount of physical activity.;Long Term: Exercising regularly at least 3-5 days a week.       Increase Strength and Stamina Yes       Intervention Provide advice, education, support and counseling about physical activity/exercise needs.;Develop an individualized exercise prescription for aerobic and resistive training based on initial evaluation findings, risk stratification, comorbidities and participant's personal goals.       Expected Outcomes Short Term: Perform resistance training exercises routinely during rehab and add in resistance training at home;Long Term: Improve cardiorespiratory fitness, muscular endurance and strength as measured by increased METs and functional capacity (6MWT);Short Term: Increase workloads from initial exercise prescription for resistance, speed, and METs.       Able to understand and use rate of perceived exertion (RPE) scale Yes       Intervention Provide education and explanation on how to use RPE scale       Expected Outcomes Short Term: Able to use RPE daily in rehab to express subjective intensity level;Long Term:  Able to use RPE to guide intensity level when exercising independently       Able to understand and use Dyspnea scale Yes       Intervention Provide education and explanation on how to use Dyspnea scale       Expected Outcomes Short Term: Able to use Dyspnea scale daily in rehab to express subjective sense of shortness of breath during exertion;Long Term: Able to use Dyspnea scale to guide intensity level when exercising independently       Knowledge and understanding of Target Heart Rate Range (THRR) Yes       Intervention Provide education and explanation of THRR including how the numbers were predicted and where they are located for reference       Expected Outcomes Short Term:  Able to state/look up THRR;Long Term: Able to use THRR to govern intensity when exercising independently;Short Term: Able to use daily as guideline for intensity in rehab       Able to check pulse independently Yes       Intervention Provide education and demonstration on how to check pulse in carotid and radial arteries.;Review the importance of being able to check your own pulse for safety during independent exercise       Expected Outcomes Short Term: Able to explain why pulse checking is important during independent exercise;Long Term: Able to check pulse independently and accurately       Understanding of Exercise Prescription Yes       Intervention Provide education, explanation, and written materials on patient's individual exercise prescription       Expected Outcomes Short Term: Able to explain program exercise prescription;Long Term: Able to explain home exercise prescription to exercise independently                Exercise Goals Re-Evaluation :  Exercise Goals Re-Evaluation     Row Name 08/01/21 1046 08/09/21 1607 08/15/21 1012 08/22/21 0933 08/29/21 1016     Exercise Goal Re-Evaluation   Exercise Goals Review Increase Physical Activity;Able  to understand and use rate of perceived exertion (RPE) scale;Knowledge and understanding of Target Heart Rate Range (THRR);Understanding of Exercise Prescription;Increase Strength and Stamina;Able to check pulse independently Increase Physical Activity;Increase Strength and Stamina Increase Physical Activity;Increase Strength and Stamina Increase Physical Activity;Increase Strength and Stamina Understanding of Exercise Prescription;Able to check pulse independently;Knowledge and understanding of Target Heart Rate Range (THRR);Able to understand and use Dyspnea scale;Able to understand and use rate of perceived exertion (RPE) scale;Increase Physical Activity;Increase Strength and Stamina   Comments Reviewed RPE and dyspnea scales, THR and program  prescription with pt today.  Pt voiced understanding and was given a copy of goals to take home. Gabrielle is doing well for the first couple of sessions that he has been here. He is tolerating his exercise prescription well and is already up to level 2 on the XR. We will continue to monitor as he progresses throughout the program. Ceejay states that he does feel like his exercise is going well and he is increasing workloads and feels stronger. His SOB is about the same and is his main limiting factor. Ulice Dash continues to do well in rehab. He has increased to level 3 on both the XR and RB.  He would benefit by increasing his load on the treadmill, if tolerated. Will continue to monitor. Reviewed home exercise with pt today.  Pt plans to walk and use staff videos at home for exercise.  Reviewed THR, pulse, RPE, sign and symptoms, pulse oximetery and when to call 911 or MD.  Also discussed weather considerations and indoor options.  Pt voiced understanding.   Expected Outcomes Short: Use RPE daily to regulate intensity. Long: Follow program prescription in THR. Short: Increase tolerance and loads on treadmill Long: Continue to increase overall MET level Short: continiue to attend cardiac rehab regularly and walk at home. Long: increase overall MET levels and become independent with exercise routine. Short: Increase loads on the treadmill as tolerated Long: Continue to build up overall strength and stamina Short: Start to add in more walking at home Long: continue to exercise independently    [place] Name 09/05/21 1557 09/22/21 1406 10/03/21 1549         Exercise Goal Re-Evaluation   Exercise Goals Review Increase Physical Activity;Increase Strength and Stamina;Understanding of Exercise Prescription Increase Physical Activity;Increase Strength and Stamina;Understanding of Exercise Prescription --     Comments Ulice Dash is doing well in rehab.  He is up to 2.3 mph on the treadmill.  We will encourage him to try 4 lb hand  weights.  We will continue to monitor his progress. Ulice Dash continues to do well. He has increased his treadmill to 2.4 mph and a 1% incline. He is hitting his THR most sessions. He still has not tried 4 lb yet and we will continue to encourage him to increase those. Will continue to monitor. Has not attended since last update.  Ulice Dash has had intermitten attendance due to hospital admissions.  He was last here on 5/15 and 5/22.  He was admitted from 5/18-5/20.  Today, he called out with other appointments and has since been sent to ED for GI distress and extreme weight loss. We will continue to follow.     Expected Outcomes Short: Try 4lb handweights Long: Continue to improve stamina Short: Increase to 4 lb resistance training Long: Continue to increase overall MET level --              Discharge Exercise Prescription (Final Exercise Prescription Changes):  Exercise Prescription  Changes - 09/22/21 1400       Response to Exercise   Blood Pressure (Admit) 110/62    Blood Pressure (Exercise) 130/68    Blood Pressure (Exit) 108/64    Heart Rate (Admit) 87 bpm    Heart Rate (Exercise) 118 bpm    Heart Rate (Exit) 92 bpm    Rating of Perceived Exertion (Exercise) 13    Symptoms none    Duration Continue with 30 min of aerobic exercise without signs/symptoms of physical distress.    Intensity THRR unchanged      Progression   Progression Continue to progress workloads to maintain intensity without signs/symptoms of physical distress.    Average METs 3.41      Resistance Training   Training Prescription Yes    Weight 3 lb    Reps 10-15      Interval Training   Interval Training No      Treadmill   MPH 2.4    Grade 1    Minutes 15    METs 3.17      Recumbant Bike   Level 2    Watts 25    Minutes 15    METs 2.72      NuStep   Level 4    Minutes 15    METs 3.7      REL-XR   Level 3    Minutes 15    METs 4.9      Home Exercise Plan   Plans to continue exercise at Home  (comment)   walking, staff vidoes   Frequency Add 2 additional days to program exercise sessions.    Initial Home Exercises Provided 08/29/21      Oxygen   Maintain Oxygen Saturation 88% or higher             Nutrition:  Target Goals: Understanding of nutrition guidelines, daily intake of sodium '1500mg'$ , cholesterol '200mg'$ , calories 30% from fat and 7% or less from saturated fats, daily to have 5 or more servings of fruits and vegetables.  Education: All About Nutrition: -Group instruction provided by verbal, written material, interactive activities, discussions, models, and posters to present general guidelines for heart healthy nutrition including fat, fiber, MyPlate, the role of sodium in heart healthy nutrition, utilization of the nutrition label, and utilization of this knowledge for meal planning. Follow up email sent as well. Written material given at graduation. Flowsheet Row Cardiac Rehab from 07/19/2021 in [facility] Cardiac and Pulmonary Rehab  Education need identified 07/19/21       Biometrics:  Pre Biometrics - 07/19/21 1441       Pre Biometrics   Height $Remov'5\' 8"'IMrnBK$  (1.727 m)    Weight 191 lb 4.8 oz (86.8 kg)    BMI (Calculated) 29.09    Single Leg Stand 9.63 seconds              Nutrition Therapy Plan and Nutrition Goals:  Nutrition Therapy & Goals - 08/01/21 1043       Nutrition Therapy   RD appointment deferred Yes   Pt would not like to speak with RD at this time, will continue to follow up.     Personal Nutrition Goals   Nutrition Goal Pt would not like to speak with RD at this time, will continue to follow up.             Nutrition Assessments:  MEDIFICTS Score Key: ?70 Need to make dietary changes  40-70 Heart Healthy Diet ? 40 Therapeutic  Level Cholesterol Diet  Flowsheet Row Cardiac Rehab from 07/19/2021 in [facility] Cardiac and Pulmonary Rehab  Picture Your Plate Total Score on Admission 66      Picture Your Plate Scores: <76 Unhealthy  dietary pattern with much room for improvement. 41-50 Dietary pattern unlikely to meet recommendations for good health and room for improvement. 51-60 More healthful dietary pattern, with some room for improvement.  >60 Healthy dietary pattern, although there may be some specific behaviors that could be improved.    Nutrition Goals Re-Evaluation:  Nutrition Goals Re-Evaluation     [place] Name 08/29/21 1018             Goals   Nutrition Goal Continues to decline nurtition appt       Comment Ulice Dash is doing well with his diet.  He keeps a close eye on his salt intake and aims for a good variety.  He is trying to get in lots of fruits and vegetables.       Expected Outcome Continue to focus on heart healthy diet                Nutrition Goals Discharge (Final Nutrition Goals Re-Evaluation):  Nutrition Goals Re-Evaluation - 08/29/21 1018       Goals   Nutrition Goal Continues to decline nurtition appt    Comment Ulice Dash is doing well with his diet.  He keeps a close eye on his salt intake and aims for a good variety.  He is trying to get in lots of fruits and vegetables.    Expected Outcome Continue to focus on heart healthy diet             Psychosocial: Target Goals: Acknowledge presence or absence of significant depression and/or stress, maximize coping skills, provide positive support system. Participant is able to verbalize types and ability to use techniques and skills needed for reducing stress and depression.   Education: Stress, Anxiety, and Depression - Group verbal and visual presentation to define topics covered.  Reviews how body is impacted by stress, anxiety, and depression.  Also discusses healthy ways to reduce stress and to treat/manage anxiety and depression.  Written material given at graduation.   Education: Sleep Hygiene -Provides group verbal and written instruction about how sleep can affect your health.  Define sleep hygiene, discuss sleep cycles and impact  of sleep habits. Review good sleep hygiene tips.    Initial Review & Psychosocial Screening:  Initial Psych Review & Screening - 07/11/21 1036       Initial Review   Current issues with None Identified      Family Dynamics   Good Support System? Yes    Comments He can look to his wife and children for support. He has a positve outlook on his health.      Barriers   Psychosocial barriers to participate in program The patient should benefit from training in stress management and relaxation.;There are no identifiable barriers or psychosocial needs.      Screening Interventions   Interventions Encouraged to exercise;To provide support and resources with identified psychosocial needs;Program counselor consult;Provide feedback about the scores to participant    Expected Outcomes Short Term goal: Utilizing psychosocial counselor, staff and physician to assist with identification of specific Stressors or current issues interfering with healing process. Setting desired goal for each stressor or current issue identified.;Long Term Goal: Stressors or current issues are controlled or eliminated.;Short Term goal: Identification and review with participant of any Quality of Life or  Depression concerns found by scoring the questionnaire.;Long Term goal: The participant improves quality of Life and PHQ9 Scores as seen by post scores and/or verbalization of changes             Quality of Life Scores:   Quality of Life - 07/19/21 1440       Quality of Life   Select Quality of Life      Quality of Life Scores   Health/Function Pre 24.87 %    Socioeconomic Pre 30 %    Psych/Spiritual Pre 30 %    Family Pre 28.8 %    GLOBAL Pre 27.56 %            Scores of 19 and below usually indicate a poorer quality of life in these areas.  A difference of  2-3 points is a clinically meaningful difference.  A difference of 2-3 points in the total score of the Quality of Life Index has been associated with  significant improvement in overall quality of life, self-image, physical symptoms, and general health in studies assessing change in quality of life.  PHQ-9: Review Flowsheet       09/21/2021 07/19/2021 06/30/2021 06/02/2021  Depression screen PHQ 2/9  Decreased Interest 0 0 0 0  Down, Depressed, Hopeless 0 0 0 0  PHQ - 2 Score 0 0 0 0  Altered sleeping - 1 - -  Tired, decreased energy - 1 - -  Change in appetite - 0 - -  Feeling bad or failure about yourself  - 0 - -  Trouble concentrating - 0 - -  Moving slowly or fidgety/restless - 0 - -  Suicidal thoughts - 0 - -  PHQ-9 Score - 2 - -  Difficult doing work/chores - Not difficult at all - -   Interpretation of Total Score  Total Score Depression Severity:  1-4 = Minimal depression, 5-9 = Mild depression, 10-14 = Moderate depression, 15-19 = Moderately severe depression, 20-27 = Severe depression   Psychosocial Evaluation and Intervention:  Psychosocial Evaluation - 07/11/21 1037       Psychosocial Evaluation & Interventions   Interventions Encouraged to exercise with the program and follow exercise prescription;Relaxation education;Stress management education    Comments He can look to his wife and children for support. He has a positve outlook on his health.    Expected Outcomes Short: Start [facility] to help with mood. Long: Maintain a healthy mental state    Continue Psychosocial Services  Follow up required by staff             Psychosocial Re-Evaluation:  Psychosocial Re-Evaluation     [place] Name 08/15/21 1017 08/29/21 1017           Psychosocial Re-Evaluation   Current issues with None Identified None Identified      Comments No new sleep or mental health/stress concerns reported. Ulice Dash is doing well in rehab.  He denies any major stressors currently and sleeps well most days. He is enjoying coming to rehab and has really liked having the seated equipment to get moving again as walking is a little more difficult for  him.      Expected Outcomes Short: continue to attend cardiac rehab for mental health benefits related to exercise. Long: Maintain good mental health habits and sleep patterens. Short: Continue to exercise to stay loose and feel good Long: continue to stay positive      Interventions Encouraged to attend Cardiac Rehabilitation for the exercise Encouraged to attend  Cardiac Rehabilitation for the exercise      Continue Psychosocial Services  Follow up required by staff --               Psychosocial Discharge (Final Psychosocial Re-Evaluation):  Psychosocial Re-Evaluation - 08/29/21 1017       Psychosocial Re-Evaluation   Current issues with None Identified    Comments Ulice Dash is doing well in rehab.  He denies any major stressors currently and sleeps well most days. He is enjoying coming to rehab and has really liked having the seated equipment to get moving again as walking is a little more difficult for him.    Expected Outcomes Short: Continue to exercise to stay loose and feel good Long: continue to stay positive    Interventions Encouraged to attend Cardiac Rehabilitation for the exercise             Vocational Rehabilitation: Provide vocational rehab assistance to qualifying candidates.   Vocational Rehab Evaluation & Intervention:   Education: Education Goals: Education classes will be provided on a variety of topics geared toward better understanding of heart health and risk factor modification. Participant will state understanding/return demonstration of topics presented as noted by education test scores.  Learning Barriers/Preferences:  Learning Barriers/Preferences - 07/11/21 1035       Learning Barriers/Preferences   Learning Barriers None    Learning Preferences None             General Cardiac Education Topics:  AED/CPR: - Group verbal and written instruction with the use of models to demonstrate the basic use of the AED with the basic ABC's of  resuscitation.   Anatomy and Cardiac Procedures: - Group verbal and visual presentation and models provide information about basic cardiac anatomy and function. Reviews the testing methods done to diagnose heart disease and the outcomes of the test results. Describes the treatment choices: Medical Management, Angioplasty, or Coronary Bypass Surgery for treating various heart conditions including Myocardial Infarction, Angina, Valve Disease, and Cardiac Arrhythmias.  Written material given at graduation.   Medication Safety: - Group verbal and visual instruction to review commonly prescribed medications for heart and lung disease. Reviews the medication, class of the drug, and side effects. Includes the steps to properly store meds and maintain the prescription regimen.  Written material given at graduation.   Intimacy: - Group verbal instruction through game format to discuss how heart and lung disease can affect sexual intimacy. Written material given at graduation..   Know Your Numbers and Heart Failure: - Group verbal and visual instruction to discuss disease risk factors for cardiac and pulmonary disease and treatment options.  Reviews associated critical values for Overweight/Obesity, Hypertension, Cholesterol, and Diabetes.  Discusses basics of heart failure: signs/symptoms and treatments.  Introduces Heart Failure Zone chart for action plan for heart failure.  Written material given at graduation.   Infection Prevention: - Provides verbal and written material to individual with discussion of infection control including proper hand washing and proper equipment cleaning during exercise session. Flowsheet Row Cardiac Rehab from 07/11/2021 in [facility] Cardiac and Pulmonary Rehab  Date 07/11/21  Educator [facility]  Instruction Review Code 1- Verbalizes Understanding       Falls Prevention: - Provides verbal and written material to individual with discussion of falls prevention and  safety. Flowsheet Row Cardiac Rehab from 07/11/2021 in [facility] Cardiac and Pulmonary Rehab  Date 07/11/21  Educator [facility]  Instruction Review Code 1- Verbalizes Understanding       Other: -Provides group  and verbal instruction on various topics (see comments)   Knowledge Questionnaire Score:  Knowledge Questionnaire Score - 07/19/21 1434       Knowledge Questionnaire Score   Pre Score 23/26: Nutrition, Exercise             Core Components/Risk Factors/Patient Goals at Admission:  Personal Goals and Risk Factors at Admission - 07/19/21 1531       Core Components/Risk Factors/Patient Goals on Admission    Weight Management Yes;Weight Loss    Intervention Weight Management: Develop a combined nutrition and exercise program designed to reach desired caloric intake, while maintaining appropriate intake of nutrient and fiber, sodium and fats, and appropriate energy expenditure required for the weight goal.;Weight Management: Provide education and appropriate resources to help participant work on and attain dietary goals.;Weight Management/Obesity: Establish reasonable short term and long term weight goals.    Admit Weight 191 lb (86.6 kg)    Goal Weight: Short Term 187 lb (84.8 kg)    Goal Weight: Long Term 180 lb (81.6 kg)    Expected Outcomes Short Term: Continue to assess and modify interventions until short term weight is achieved;Long Term: Adherence to nutrition and physical activity/exercise program aimed toward attainment of established weight goal;Weight Loss: Understanding of general recommendations for a balanced deficit meal plan, which promotes 1-2 lb weight loss per week and includes a negative energy balance of [phone] kcal/d;Understanding recommendations for meals to include 15-35% energy as protein, 25-35% energy from fat, 35-60% energy from carbohydrates, less than $RemoveB'200mg'CGFEJZWZ$  of dietary cholesterol, 20-35 gm of total fiber daily;Understanding of distribution of calorie intake  throughout the day with the consumption of 4-5 meals/snacks    Diabetes Yes    Intervention Provide education about signs/symptoms and action to take for hypo/hyperglycemia.;Provide education about proper nutrition, including hydration, and aerobic/resistive exercise prescription along with prescribed medications to achieve blood glucose in normal ranges: Fasting glucose 65-99 mg/dL    Expected Outcomes Short Term: Participant verbalizes understanding of the signs/symptoms and immediate care of hyper/hypoglycemia, proper foot care and importance of medication, aerobic/resistive exercise and nutrition plan for blood glucose control.;Long Term: Attainment of HbA1C < 7%.    Heart Failure Yes    Intervention Provide a combined exercise and nutrition program that is supplemented with education, support and counseling about heart failure. Directed toward relieving symptoms such as shortness of breath, decreased exercise tolerance, and extremity edema.    Expected Outcomes Improve functional capacity of life;Short term: Attendance in program 2-3 days a week with increased exercise capacity. Reported lower sodium intake. Reported increased fruit and vegetable intake. Reports medication compliance.;Short term: Daily weights obtained and reported for increase. Utilizing diuretic protocols set by physician.;Long term: Adoption of self-care skills and reduction of barriers for early signs and symptoms recognition and intervention leading to self-care maintenance.    Hypertension Yes    Intervention Provide education on lifestyle modifcations including regular physical activity/exercise, weight management, moderate sodium restriction and increased consumption of fresh fruit, vegetables, and low fat dairy, alcohol moderation, and smoking cessation.;Monitor prescription use compliance.    Expected Outcomes Short Term: Continued assessment and intervention until BP is < 140/41mm HG in hypertensive participants. < 130/5mm  HG in hypertensive participants with diabetes, heart failure or chronic kidney disease.;Long Term: Maintenance of blood pressure at goal levels.    Lipids Yes    Intervention Provide education and support for participant on nutrition & aerobic/resistive exercise along with prescribed medications to achieve LDL '70mg'$ , HDL >$Remo'40mg'nassE$ .  Expected Outcomes Short Term: Participant states understanding of desired cholesterol values and is compliant with medications prescribed. Participant is following exercise prescription and nutrition guidelines.;Long Term: Cholesterol controlled with medications as prescribed, with individualized exercise RX and with personalized nutrition plan. Value goals: LDL < $Rem'70mg'nSIZ$ , HDL > 40 mg.             Education:Diabetes - Individual verbal and written instruction to review signs/symptoms of diabetes, desired ranges of glucose level fasting, after meals and with exercise. Acknowledge that pre and post exercise glucose checks will be done for 3 sessions at entry of program. [place] from 07/11/2021 in [facility] Cardiac and Pulmonary Rehab  Date 07/11/21  Educator [facility]  Instruction Review Code 1- Verbalizes Understanding       Core Components/Risk Factors/Patient Goals Review:   Goals and Risk Factor Review     Row Name 08/15/21 1021 08/29/21 1019           Core Components/Risk Factors/Patient Goals Review   Personal Goals Review Weight Management/Obesity;Heart Failure;Diabetes;Hypertension;Lipids Weight Management/Obesity;Heart Failure;Diabetes;Hypertension;Lipids      Review Patient reports montioring weight, blood pressure, and blood sugar at home. All levels were reported to be within normal range. Patient is taking all medications as prescribed. Weight has beeen consistent upon monitoring. Ulice Dash is doing well in rehab.  He watches his weight closely and will take extra lasix if it is up or if he notices any swelling or increased SOB.  He had a follow up  last week with cardiology and got a good report.  His doctor is getting ready to leave so he will need a new cardiologist after July.  His pressures are doing well and he continues to check them at home.  He is good about checking his sugars each morning and it is usually aound 135 mg/dl each day.  He writes it down along with his pressures each day.      Expected Outcomes Short: continue to take all meds and monitor BP, blood sugar, and weight at home. Long: continue to control cardiac risk factors with heart healthy lifestyle. Short: Conitnue to work on weight loss Long: Conitnue to manage heart failure.               Core Components/Risk Factors/Patient Goals at Discharge (Final Review):   Goals and Risk Factor Review - 08/29/21 1019       Core Components/Risk Factors/Patient Goals Review   Personal Goals Review Weight Management/Obesity;Heart Failure;Diabetes;Hypertension;Lipids    Review Ulice Dash is doing well in rehab.  He watches his weight closely and will take extra lasix if it is up or if he notices any swelling or increased SOB.  He had a follow up last week with cardiology and got a good report.  His doctor is getting ready to leave so he will need a new cardiologist after July.  His pressures are doing well and he continues to check them at home.  He is good about checking his sugars each morning and it is usually aound 135 mg/dl each day.  He writes it down along with his pressures each day.    Expected Outcomes Short: Conitnue to work on weight loss Long: Conitnue to manage heart failure.             ITP Comments:  ITP Comments     Row Name 07/11/21 1034 07/19/21 1431 08/01/21 1045 08/17/21 1414 09/14/21 0919   ITP Comments Virtual Visit completed. Patient informed on EP and RD appointment  and 6 Minute walk test. Patient also informed of patient health questionnaires on My Chart. Patient Verbalizes understanding. Visit diagnosis can be found in [facility] 02/14/2021. Completed 6MWT and  gym orientation. Initial ITP created and sent for review to Dr. Emily Filbert, Medical Director. First full day of exercise!  Patient was oriented to gym and equipment including functions, settings, policies, and procedures.  Patient's individual exercise prescription and treatment plan were reviewed.  All starting workloads were established based on the results of the 6 minute walk test done at initial orientation visit.  The plan for exercise progression was also introduced and progression will be customized based on patient's performance and goals. 30 Day review completed. Medical Director ITP review done, changes made as directed, and signed approval by Medical Director. 30 Day review completed. Medical Director ITP review done, changes made as directed, and signed approval by Medical Director.    [place] Name 10/03/21 1547 10/12/21 1351         ITP Comments Ulice Dash has had intermitten attendance due to hospital admissions.  He was last here on 5/15 and 5/22.  He was admitted from 5/18-5/20.  Today, he called out with other appointments and has since been sent to ED for GI distress and extreme weight loss. We will continue to follow. 30 Day review completed. Medical Director ITP review done, changes made as directed, and signed approval by Medical Director.               Comments:

## 2021-10-17 ENCOUNTER — Encounter: Payer: Medicare Other | Attending: Internal Medicine | Admitting: *Deleted

## 2021-10-17 DIAGNOSIS — Z951 Presence of aortocoronary bypass graft: Secondary | ICD-10-CM | POA: Insufficient documentation

## 2021-10-17 NOTE — Progress Notes (Signed)
Daily Session Note  Patient Details  Name: Jermaine Kramer MRN: [id number] Date of Birth: April 13, 1951 Referring Provider:   Flowsheet Row Cardiac Rehab from 07/19/2021 in [facility] Cardiac and Pulmonary Rehab  Referring Provider Ernesta Amble MD       Encounter Date: 10/17/2021  Check In:  Session Check In - 10/17/21 1006       Check-In   Supervising physician immediately available to respond to emergencies See telemetry face sheet for immediately available ER MD    Location [name]    Staff Present Heath Lark, RN, BSN, Laveda Norman, BS, ACSM CEP, Exercise Physiologist;Kelly Rosalia Hammers, MPA, RN    Virtual Visit No    Medication changes reported     No    Fall or balance concerns reported    No    Warm-up and Cool-down Performed on first and last piece of equipment    Resistance Training Performed Yes    VAD Patient? No    PAD/SET Patient? No      Pain Assessment   Currently in Pain? No/denies                Social History   Tobacco Use  Smoking Status Never  Smokeless Tobacco Never    Goals Met:  Independence with exercise equipment Exercise tolerated well No report of concerns or symptoms today  Goals Unmet:  Not Applicable  Comments: Pt able to follow exercise prescription today without complaint.  Will continue to monitor for progression. Returning after hospitalization for the rotovirus. About 20 lb weight loss   Dr. Emily Filbert is Medical Director for [place].  Dr. Ottie Glazier is Medical Director for [facility] Pulmonary Rehabilitation.

## 2021-10-19 DIAGNOSIS — Z951 Presence of aortocoronary bypass graft: Secondary | ICD-10-CM | POA: Diagnosis not present

## 2021-10-19 NOTE — Progress Notes (Signed)
Daily Session Note  Patient Details  Name: Jermaine Kramer MRN: [id number] Date of Birth: 1951-04-28 Referring Provider:   Flowsheet Row Cardiac Rehab from 07/19/2021 in [facility] Cardiac and Pulmonary Rehab  Referring Provider Ernesta Amble MD       Encounter Date: 10/19/2021  Check In:  Session Check In - 10/19/21 0948       Check-In   Supervising physician immediately available to respond to emergencies See telemetry face sheet for immediately available ER MD    Location [name]    Staff Present Carson Myrtle, BS, RRT, CPFT;Kristen Coble, RN,BC,MSN;Sophia Sperry Rosalia Hammers, MPA, RN    Virtual Visit No    Medication changes reported     No    Fall or balance concerns reported    No    Warm-up and Cool-down Performed on first and last piece of equipment    Resistance Training Performed Yes    VAD Patient? No    PAD/SET Patient? No      Pain Assessment   Currently in Pain? No/denies                Social History   Tobacco Use  Smoking Status Never  Smokeless Tobacco Never    Goals Met:  Independence with exercise equipment Exercise tolerated well No report of concerns or symptoms today Strength training completed today  Goals Unmet:  Not Applicable  Comments: Pt able to follow exercise prescription today without complaint.  Will continue to monitor for progression.    Dr. Emily Filbert is Medical Director for [place].  Dr. Ottie Glazier is Medical Director for [facility] Pulmonary Rehabilitation.

## 2021-10-26 DIAGNOSIS — Z951 Presence of aortocoronary bypass graft: Secondary | ICD-10-CM | POA: Diagnosis not present

## 2021-10-26 NOTE — Progress Notes (Signed)
Daily Session Note  Patient Details  Name: Jermaine Kramer MRN: [id number] Date of Birth: Jun 27, 1950 Referring Provider:   Flowsheet Row Cardiac Rehab from 07/19/2021 in [facility] Cardiac and Pulmonary Rehab  Referring Provider Ernesta Amble MD       Encounter Date: 10/26/2021  Check In:  Session Check In - 10/26/21 1000       Check-In   Supervising physician immediately available to respond to emergencies See telemetry face sheet for immediately available ER MD    Location [name]    Staff Present Carson Myrtle, BS, RRT, CPFT;Quintel Mccalla Amedeo Plenty, BS, ACSM CEP, Exercise Physiologist;Joseph Tessie Fass, Fabio Neighbors, MPA, RN    Virtual Visit No    Medication changes reported     No    Fall or balance concerns reported    No    Warm-up and Cool-down Performed on first and last piece of equipment    Resistance Training Performed Yes    VAD Patient? No    PAD/SET Patient? No      Pain Assessment   Currently in Pain? No/denies                Social History   Tobacco Use  Smoking Status Never  Smokeless Tobacco Never    Goals Met:  Independence with exercise equipment Exercise tolerated well No report of concerns or symptoms today Strength training completed today  Goals Unmet:  Not Applicable  Comments: Pt able to follow exercise prescription today without complaint.  Will continue to monitor for progression.    Dr. Emily Filbert is Medical Director for [place].  Dr. Ottie Glazier is Medical Director for [facility] Pulmonary Rehabilitation.

## 2021-10-31 ENCOUNTER — Encounter: Payer: Medicare Other | Attending: Internal Medicine | Admitting: *Deleted

## 2021-10-31 DIAGNOSIS — Z951 Presence of aortocoronary bypass graft: Secondary | ICD-10-CM | POA: Diagnosis not present

## 2021-10-31 NOTE — Progress Notes (Signed)
Daily Session Note  Patient Details  Name: SAMUL MCINROY MRN: [id number] Date of Birth: [birth year] Referring Provider:   Flowsheet Row Cardiac Rehab from 07/19/2021 in [facility] Cardiac and Pulmonary Rehab  Referring Provider Ernesta Amble MD       Encounter Date: 10/31/2021  Check In:  Session Check In - 10/31/21 1046       Check-In   Supervising physician immediately available to respond to emergencies See telemetry face sheet for immediately available ER MD    Location [name]    Staff Present Heath Lark, RN, BSN, CCRP;Laureen Owens Shark, BS, RRT, CPFT;Joseph [place], [place]    Virtual Visit No    Medication changes reported     No    Fall or balance concerns reported    No    Warm-up and Cool-down Performed on first and last piece of equipment    Resistance Training Performed Yes    VAD Patient? No    PAD/SET Patient? No      Pain Assessment   Currently in Pain? No/denies                Social History   Tobacco Use  Smoking Status Never  Smokeless Tobacco Never    Goals Met:  Independence with exercise equipment Exercise tolerated well No report of concerns or symptoms today  Goals Unmet:  Not Applicable  Comments: Pt able to follow exercise prescription today without complaint.  Will continue to monitor for progression.    Dr. Emily Filbert is Medical Director for [place].  Dr. Ottie Glazier is Medical Director for [facility] Pulmonary Rehabilitation.

## 2021-11-02 DIAGNOSIS — Z951 Presence of aortocoronary bypass graft: Secondary | ICD-10-CM | POA: Diagnosis not present

## 2021-11-02 NOTE — Progress Notes (Signed)
Daily Session Note  Patient Details  Name: Jermaine Kramer MRN: [id number] Date of Birth: 09/12/1950 Referring Provider:   Flowsheet Row Cardiac Rehab from 07/19/2021 in [facility] Cardiac and Pulmonary Rehab  Referring Provider Ernesta Amble MD       Encounter Date: 11/02/2021  Check In:  Session Check In - 11/02/21 0949       Check-In   Supervising physician immediately available to respond to emergencies See telemetry face sheet for immediately available ER MD    Location [name]    Staff Present Antionette Fairy, BS, Exercise Physiologist;Joseph [place], RCP,RRT,BSRT;Clarke Amburn Deltana, MPA, RN;Melissa [place], RDN, LDN;Jessica Toone, MA, RCEP, CCRP, CCET    Virtual Visit No    Medication changes reported     No    Fall or balance concerns reported    No    Warm-up and Cool-down Performed on first and last piece of equipment    Resistance Training Performed Yes    VAD Patient? No    PAD/SET Patient? No      Pain Assessment   Currently in Pain? No/denies                Social History   Tobacco Use  Smoking Status Never  Smokeless Tobacco Never    Goals Met:  Independence with exercise equipment Exercise tolerated well No report of concerns or symptoms today Strength training completed today  Goals Unmet:  Not Applicable  Comments: Pt able to follow exercise prescription today without complaint.  Will continue to monitor for progression.    Dr. Emily Filbert is Medical Director for [place].  Dr. Ottie Glazier is Medical Director for [facility] Pulmonary Rehabilitation.

## 2021-11-03 ENCOUNTER — Other Ambulatory Visit
Admission: RE | Admit: 2021-11-03 | Discharge: 2021-11-03 | Disposition: A | Discharge status: Home / Self Care | Payer: Medicare Other | Source: Ambulatory Visit | Attending: Cardiology | Admitting: Cardiology

## 2021-11-03 DIAGNOSIS — I493 Ventricular premature depolarization: Secondary | ICD-10-CM | POA: Diagnosis present

## 2021-11-03 DIAGNOSIS — I25118 Atherosclerotic heart disease of native coronary artery with other forms of angina pectoris: Secondary | ICD-10-CM | POA: Insufficient documentation

## 2021-11-03 DIAGNOSIS — I420 Dilated cardiomyopathy: Secondary | ICD-10-CM | POA: Diagnosis present

## 2021-11-03 LAB — BRAIN NATRIURETIC PEPTIDE: B Natriuretic Peptide: 1457.3 pg/mL — ABNORMAL HIGH (ref 0.0–100.0)

## 2021-11-07 ENCOUNTER — Encounter: Payer: Medicare Other | Admitting: *Deleted

## 2021-11-07 DIAGNOSIS — Z951 Presence of aortocoronary bypass graft: Secondary | ICD-10-CM

## 2021-11-07 NOTE — Progress Notes (Signed)
Daily Session Note  Patient Details  Name: Jermaine Kramer MRN: [id number] Date of Birth: June 29, 1950 Referring Provider:   Flowsheet Row Cardiac Rehab from 07/19/2021 in [facility] Cardiac [facility]  Referring Provider Ernesta Amble MD       Encounter Date: 11/07/2021  Check In:  Session Check In - 11/07/21 1029       Check-In   Supervising physician immediately available to respond to emergencies See telemetry face sheet for immediately available ER MD    Location [name]    Staff Present Heath Lark, RN, BSN, CCRP;Joseph Odessa, RCP,RRT,BSRT;Kelly [employer], BS, ACSM CEP, Exercise Physiologist    Virtual Visit No    Medication changes reported     No    Fall or balance concerns reported    No    Warm-up and Cool-down Performed on first and last piece of equipment    Resistance Training Performed Yes    VAD Patient? No    PAD/SET Patient? No      Pain Assessment   Currently in Pain? No/denies                Social History   Tobacco Use  Smoking Status Never  Smokeless Tobacco Never    Goals Met:  Independence with exercise equipment Exercise tolerated well No report of concerns or symptoms today  Goals Unmet:  Not Applicable  Comments: Pt able to follow exercise prescription today without complaint.  Will continue to monitor for progression.    Dr. Emily Filbert is Medical Director for [place].  Dr. Ottie Glazier is Medical Director for [facility] Pulmonary Rehabilitation.

## 2021-11-09 ENCOUNTER — Encounter: Payer: Self-pay | Admitting: *Deleted

## 2021-11-09 DIAGNOSIS — Z951 Presence of aortocoronary bypass graft: Secondary | ICD-10-CM | POA: Diagnosis not present

## 2021-11-09 NOTE — Progress Notes (Signed)
Daily Session Note  Patient Details  Name: Jermaine Kramer MRN: [id number] Date of Birth: 03-22-1951 Referring Provider:   Flowsheet Row Cardiac Rehab from 07/19/2021 in [facility] Cardiac [facility] Pul[facility] Rehab  Referring Provider Ernesta Amble MD       Encounter Date: 11/09/2021  Check In:  Session Check In - 11/09/21 0944       Check-In   Supervising physician immediately available to respond to emergencies See telemetry face sheet for immediately available ER MD    Location [name]    Staff Present Justin Mend, RCP,RRT,BSRT;Chrystle Murillo, MPA, RN;Melissa Sabinal, RDN, LDN    Virtual Visit No    Medication changes reported     No    Fall or balance concerns reported    No    Warm-up and Cool-down Performed on first and last piece of equipment    Resistance Training Performed Yes    VAD Patient? No    PAD/SET Patient? No      Pain Assessment   Currently in Pain? No/denies                Social History   Tobacco Use  Smoking Status Never  Smokeless Tobacco Never    Goals Met:  Independence with exercise equipment Exercise tolerated well No report of concerns or symptoms today Strength training completed today  Goals Unmet:  Not Applicable  Comments: Pt able to follow exercise prescription today without complaint.  Will continue to monitor for progression.    Dr. Emily Filbert is Medical Director for [place].  Dr. Ottie Glazier is Medical Director for [facility] Pulmonary Rehabilitation.

## 2021-11-09 NOTE — Progress Notes (Signed)
Cardiac Individual Treatment Plan  Patient Details  Name: Jermaine Kramer MRN: [id number] Date of Birth: [birth year] Referring Provider:   Flowsheet Row Cardiac Rehab from 07/19/2021 in [facility] Cardiac and Pulmonary Rehab  Referring Provider Ernesta Amble MD       Initial Encounter Date:  Flowsheet Row Cardiac Rehab from 07/19/2021 in [facility] Cardiac and Pulmonary Rehab  Date 07/19/21       Visit Diagnosis: S/P CABG x 3  Patient's Home Medications on Admission:  Current Outpatient Medications:    amiodarone (PACERONE) 200 MG tablet, Take 200 mg by mouth daily., Disp: , Rfl:    apixaban (ELIQUIS) 5 MG TABS tablet, Take 1 tablet (5 mg total) by mouth 2 (two) times daily. DO NOT RESTART UNTIL 06/27/2021., Disp: 60 tablet, Rfl:    aspirin EC 81 MG tablet, Take 81 mg by mouth daily., Disp: , Rfl:    atorvastatin (LIPITOR) 80 MG tablet, Take 80 mg by mouth daily., Disp: , Rfl:    glucose blood (PRECISION QID TEST) test strip, Use 3 (three) times daily Use as instructed. One touch ultra, Disp: , Rfl:    metFORMIN (GLUCOPHAGE) 1000 MG tablet, Take 1,000 mg by mouth 2 (two) times daily with a meal., Disp: , Rfl:    metoprolol succinate (TOPROL-XL) 25 MG 24 hr tablet, Take 0.5 tablets (12.5 mg total) by mouth every evening., Disp: 45 tablet, Rfl: 3   omeprazole (PRILOSEC) 40 MG capsule, Take 40 mg by mouth daily., Disp: , Rfl:    ondansetron (ZOFRAN) 4 MG tablet, Take 4 mg by mouth every 8 (eight) hours as needed., Disp: , Rfl:    spironolactone (ALDACTONE) 25 MG tablet, Take 12.5 mg by mouth daily., Disp: , Rfl:    tamsulosin (FLOMAX) 0.4 MG CAPS capsule, Take 0.8 mg by mouth in the morning and at bedtime. (Patient not taking: Reported on 10/11/2021), Disp: , Rfl:    torsemide (DEMADEX) 20 MG tablet, Take 1 tablet (20 mg total) by mouth daily. And additional $RemoveBefore'20mg'[id number]$  if needed, Disp: 40 tablet, Rfl: 3  Past Medical History: Past Medical History:  Diagnosis Date   Arrhythmia    atrial fibrillation    CHF (congestive heart failure) (HCC)    Coronary artery disease    Diabetes mellitus without complication (HCC)    GERD (gastroesophageal reflux disease)    Hypercholesteremia    Hypertension    Sleep apnea    Stroke ([place])     Tobacco Use: Social History   Tobacco Use  Smoking Status Never  Smokeless Tobacco Never    Labs: Review Flowsheet       Latest Ref Rng & Units 04/21/2021 05/16/2021  Labs for ITP Cardiac and Pulmonary Rehab  Hemoglobin A1c 4.8 - 5.6 % - 7.8   Bicarbonate 20.0 - 28.0 mmol/L 23.2  -  Acid-base deficit 0.0 - 2.0 mmol/L 0.6  -  O2 Saturation % 97.4  -     Exercise Target Goals: Exercise Program Goal: Individual exercise prescription set using results from initial 6 min walk test and THRR while considering  patient's activity barriers and safety.   Exercise Prescription Goal: Initial exercise prescription builds to 30-45 minutes a day of aerobic activity, 2-3 days per week.  Home exercise guidelines will be given to patient during program as part of exercise prescription that the participant will acknowledge.   Education: Aerobic Exercise: - Group verbal and visual presentation on the components of exercise prescription. Introduces F.I.T.T principle from ACSM for exercise prescriptions.  Reviews F.I.T.T. principles of aerobic exercise including progression. Written material given at graduation. Flowsheet Row Cardiac Rehab from 07/19/2021 in [facility] Cardiac and Pulmonary Rehab  Education need identified 07/19/21       Education: Resistance Exercise: - Group verbal and visual presentation on the components of exercise prescription. Introduces F.I.T.T principle from ACSM for exercise prescriptions  Reviews F.I.T.T. principles of resistance exercise including progression. Written material given at graduation.    Education: Exercise & Equipment Safety: - Individual verbal instruction and demonstration of equipment use and safety with use of the  equipment. Flowsheet Row Cardiac Rehab from 07/11/2021 in [facility] Cardiac and Pulmonary Rehab  Date 07/11/21  Educator [facility]  Instruction Review Code 1- Verbalizes Understanding       Education: Exercise Physiology & General Exercise Guidelines: - Group verbal and written instruction with models to review the exercise physiology of the cardiovascular system and associated critical values. Provides general exercise guidelines with specific guidelines to those with heart or lung disease.  Flowsheet Row Cardiac Rehab from 07/19/2021 in [facility] Cardiac and Pulmonary Rehab  Education need identified 07/19/21       Education: Flexibility, Balance, Mind/Body Relaxation: - Group verbal and visual presentation with interactive activity on the components of exercise prescription. Introduces F.I.T.T principle from ACSM for exercise prescriptions. Reviews F.I.T.T. principles of flexibility and balance exercise training including progression. Also discusses the mind body connection.  Reviews various relaxation techniques to help reduce and manage stress (i.e. Deep breathing, progressive muscle relaxation, and visualization). Balance handout provided to take home. Written material given at graduation.   Activity Barriers & Risk Stratification:  Activity Barriers & Cardiac Risk Stratification - 07/19/21 1442       Activity Barriers & Cardiac Risk Stratification   Activity Barriers Decreased Ventricular Function;Other (comment);Deconditioning    Comments Per patient, weight restrictions until 4/6- recent ICD placement    Cardiac Risk Stratification High             6 Minute Walk:  6 Minute Walk     Row Name 07/19/21 1458         6 Minute Walk   Phase Initial     Distance 1225 feet     Walk Time 6 minutes     # of Rest Breaks 0     MPH 2.32     METS 2.76     RPE 11     Perceived Dyspnea  1     VO2 Peak 9.66     Symptoms Yes (comment)     Comments SOB, legs fatigued     Resting HR 75 bpm      Resting BP 104/64     Resting Oxygen Saturation  97 %     Exercise Oxygen Saturation  during 6 min walk 95 %     Max Ex. HR 111 bpm     Max Ex. BP 140/68     2 Minute Post BP 108/68              Oxygen Initial Assessment:   Oxygen Re-Evaluation:   Oxygen Discharge (Final Oxygen Re-Evaluation):   Initial Exercise Prescription:  Initial Exercise Prescription - 07/19/21 1500       Date of Initial Exercise RX and Referring Provider   Date 07/19/21    Referring Provider Ernesta Amble MD      Oxygen   Maintain Oxygen Saturation 88% or higher      Treadmill   MPH 2.1    Grade  0.5    Minutes 15    METs 2.75      Recumbant Bike   Level 2    RPM 60    Watts 20    Minutes 15    METs 2.7      REL-XR   Level 1    Speed 50    Minutes 15    METs 2.7      Prescription Details   Frequency (times per week) 2    Duration Progress to 30 minutes of continuous aerobic without signs/symptoms of physical distress      Intensity   THRR 40-80% of Max Heartrate 104-133    Ratings of Perceived Exertion 11-13    Perceived Dyspnea 0-4      Progression   Progression Continue to progress workloads to maintain intensity without signs/symptoms of physical distress.      Resistance Training   Training Prescription Yes    Weight 3 lb   ROM until 4//6: Recent ICD placement   Reps 10-15             Perform Capillary Blood Glucose checks as needed.  Exercise Prescription Changes:   Exercise Prescription Changes     Row Name 07/19/21 1500 08/09/21 1600 08/22/21 0900 08/29/21 1000 09/05/21 1500     Response to Exercise   Blood Pressure (Admit) 104/64 118/60 136/64 -- 110/62   Blood Pressure (Exercise) 140/68 150/72 134/74 -- 122/64   Blood Pressure (Exit) 108/68 102/54 104/64 -- 102/58   Heart Rate (Admit) 75 bpm 93 bpm 94 bpm -- 82 bpm   Heart Rate (Exercise) 111 bpm 118 bpm 114 bpm -- 119 bpm   Heart Rate (Exit) 79 bpm 101 bpm 94 bpm -- 89 bpm   Oxygen  Saturation (Admit) 97 % -- -- -- --   Oxygen Saturation (Exercise) 95 % -- -- -- --   Rating of Perceived Exertion (Exercise) _0 -- 13   Perceived Dyspnea (Exercise) 1 -- -- -- --   Symptoms SOB, legs fatigued none none -- none   Comments walk test results 2nd full day of exercise -- -- --   Duration -- Progress to 30 minutes of  aerobic without signs/symptoms of physical distress Continue with 30 min of aerobic exercise without signs/symptoms of physical distress. -- Continue with 30 min of aerobic exercise without signs/symptoms of physical distress.   Intensity -- THRR unchanged THRR unchanged -- THRR unchanged     Progression   Progression -- Continue to progress workloads to maintain intensity without signs/symptoms of physical distress. Continue to progress workloads to maintain intensity without signs/symptoms of physical distress. -- Continue to progress workloads to maintain intensity without signs/symptoms of physical distress.   Average METs -- 2.6 3.01 -- 3.81     Resistance Training   Training Prescription -- Yes Yes -- Yes   Weight -- 3 lb 3 lb -- 3 lb   Reps -- 10-15 10-15 -- 10-15     Interval Training   Interval Training -- No No -- No     Treadmill   MPH -- 2.1 2.1 -- 2.3   Grade -- 0.5 0.5 -- 0.5   Minutes -- 15 15 -- 15   METs -- 2.75 2.75 -- 2.92     Recumbant Bike   Level -- 2 3 -- 2   Watts -- 36 27 -- --   Minutes -- 15 15 -- 15   METs -- -- 2.29 -- --  NuStep   Level -- -- -- -- 4   Minutes -- -- -- -- 15   METs -- -- -- -- 4     REL-XR   Level -- 2 3 -- 3   Minutes -- 15 15 -- 15   METs -- 5.1 4.4 -- 4.5     Home Exercise Plan   Plans to continue exercise at -- -- -- Home (comment)  walking, staff vidoes Home (comment)  walking, staff vidoes   Frequency -- -- -- Add 2 additional days to program exercise sessions. Add 2 additional days to program exercise sessions.   Initial Home Exercises Provided -- -- -- 08/29/21 08/29/21     Oxygen    Maintain Oxygen Saturation -- 88% or higher 88% or higher -- 88% or higher    Row Name 09/22/21 1400 10/18/21 1600           Response to Exercise   Blood Pressure (Admit) 110/62 96/60      Blood Pressure (Exercise) 130/68 --      Blood Pressure (Exit) 108/64 94/56      Heart Rate (Admit) 87 bpm 81 bpm      Heart Rate (Exercise) 118 bpm 101 bpm      Heart Rate (Exit) 92 bpm 81 bpm      Rating of Perceived Exertion (Exercise) 13 13      Symptoms none significant weight loss due to roto virus      Comments -- return back from hospital      Duration Continue with 30 min of aerobic exercise without signs/symptoms of physical distress. Continue with 30 min of aerobic exercise without signs/symptoms of physical distress.      Intensity THRR unchanged THRR unchanged        Progression   Progression Continue to progress workloads to maintain intensity without signs/symptoms of physical distress. Continue to progress workloads to maintain intensity without signs/symptoms of physical distress.      Average METs 3.41 4.8        Resistance Training   Training Prescription Yes Yes      Weight 3 lb 3 lb      Reps 10-15 10-15        Interval Training   Interval Training No No        Treadmill   MPH 2.4 --      Grade 1 --      Minutes 15 --      METs 3.17 --        Recumbant Bike   Level 2 --      Watts 25 --      Minutes 15 --      METs 2.72 --        NuStep   Level 4 --      Minutes 15 --      METs 3.7 --        REL-XR   Level 3 1      Minutes 15 15      METs 4.9 4.7        Home Exercise Plan   Plans to continue exercise at Home (comment)  walking, staff vidoes Home (comment)  walking, staff vidoes      Frequency Add 2 additional days to program exercise sessions. Add 2 additional days to program exercise sessions.      Initial Home Exercises Provided 08/29/21 08/29/21        Oxygen   Maintain Oxygen Saturation  88% or higher 88% or higher               Exercise  Comments:   Exercise Goals and Review:   Exercise Goals     Row Name 07/19/21 1530             Exercise Goals   Increase Physical Activity Yes       Intervention Provide advice, education, support and counseling about physical activity/exercise needs.;Develop an individualized exercise prescription for aerobic and resistive training based on initial evaluation findings, risk stratification, comorbidities and participant's personal goals.       Expected Outcomes Short Term: Attend rehab on a regular basis to increase amount of physical activity.;Long Term: Add in home exercise to make exercise part of routine and to increase amount of physical activity.;Long Term: Exercising regularly at least 3-5 days a week.       Increase Strength and Stamina Yes       Intervention Provide advice, education, support and counseling about physical activity/exercise needs.;Develop an individualized exercise prescription for aerobic and resistive training based on initial evaluation findings, risk stratification, comorbidities and participant's personal goals.       Expected Outcomes Short Term: Perform resistance training exercises routinely during rehab and add in resistance training at home;Long Term: Improve cardiorespiratory fitness, muscular endurance and strength as measured by increased METs and functional capacity (6MWT);Short Term: Increase workloads from initial exercise prescription for resistance, speed, and METs.       Able to understand and use rate of perceived exertion (RPE) scale Yes       Intervention Provide education and explanation on how to use RPE scale       Expected Outcomes Short Term: Able to use RPE daily in rehab to express subjective intensity level;Long Term:  Able to use RPE to guide intensity level when exercising independently       Able to understand and use Dyspnea scale Yes       Intervention Provide education and explanation on how to use Dyspnea scale       Expected  Outcomes Short Term: Able to use Dyspnea scale daily in rehab to express subjective sense of shortness of breath during exertion;Long Term: Able to use Dyspnea scale to guide intensity level when exercising independently       Knowledge and understanding of Target Heart Rate Range (THRR) Yes       Intervention Provide education and explanation of THRR including how the numbers were predicted and where they are located for reference       Expected Outcomes Short Term: Able to state/look up THRR;Long Term: Able to use THRR to govern intensity when exercising independently;Short Term: Able to use daily as guideline for intensity in rehab       Able to check pulse independently Yes       Intervention Provide education and demonstration on how to check pulse in carotid and radial arteries.;Review the importance of being able to check your own pulse for safety during independent exercise       Expected Outcomes Short Term: Able to explain why pulse checking is important during independent exercise;Long Term: Able to check pulse independently and accurately       Understanding of Exercise Prescription Yes       Intervention Provide education, explanation, and written materials on patient's individual exercise prescription       Expected Outcomes Short Term: Able to explain program exercise prescription;Long Term: Able to explain home exercise  prescription to exercise independently                Exercise Goals Re-Evaluation :  Exercise Goals Re-Evaluation     Row Name 08/01/21 1046 08/09/21 1607 08/15/21 1012 08/22/21 0933 08/29/21 1016     Exercise Goal Re-Evaluation   Exercise Goals Review Increase Physical Activity;Able to understand and use rate of perceived exertion (RPE) scale;Knowledge and understanding of Target Heart Rate Range (THRR);Understanding of Exercise Prescription;Increase Strength and Stamina;Able to check pulse independently Increase Physical Activity;Increase Strength and Stamina  Increase Physical Activity;Increase Strength and Stamina Increase Physical Activity;Increase Strength and Stamina Understanding of Exercise Prescription;Able to check pulse independently;Knowledge and understanding of Target Heart Rate Range (THRR);Able to understand and use Dyspnea scale;Able to understand and use rate of perceived exertion (RPE) scale;Increase Physical Activity;Increase Strength and Stamina   Comments Reviewed RPE and dyspnea scales, THR and program prescription with pt today.  Pt voiced understanding and was given a copy of goals to take home. Jermaine Kramer is doing well for the first couple of sessions that he has been here. He is tolerating his exercise prescription well and is already up to level 2 on the XR. We will continue to monitor as he progresses throughout the program. [place] states that he does feel like his exercise is going well and he is increasing workloads and feels stronger. His SOB is about the same and is his main limiting factor. Jermaine Kramer continues to do well in rehab. He has increased to level 3 on both the XR and RB.  He would benefit by increasing his load on the treadmill, if tolerated. Will continue to monitor. Reviewed home exercise with pt today.  Pt plans to walk and use staff videos at home for exercise.  Reviewed THR, pulse, RPE, sign and symptoms, pulse oximetery and when to call 911 or MD.  Also discussed weather considerations and indoor options.  Pt voiced understanding.   Expected Outcomes Short: Use RPE daily to regulate intensity. Long: Follow program prescription in THR. Short: Increase tolerance and loads on treadmill Long: Continue to increase overall MET level Short: continiue to attend cardiac rehab regularly and walk at home. Long: increase overall MET levels and become independent with exercise routine. Short: Increase loads on the treadmill as tolerated Long: Continue to build up overall strength and stamina Short: Start to add in more walking at home Long:  continue to exercise independently    [place] Name 09/05/21 1557 09/22/21 1406 10/03/21 1549 10/18/21 1636 10/31/21 1020     Exercise Goal Re-Evaluation   Exercise Goals Review Increase Physical Activity;Increase Strength and Stamina;Understanding of Exercise Prescription Increase Physical Activity;Increase Strength and Stamina;Understanding of Exercise Prescription -- Increase Physical Activity;Increase Strength and Stamina;Understanding of Exercise Prescription Increase Physical Activity;Increase Strength and Stamina;Understanding of Exercise Prescription   Comments Jermaine Kramer is doing well in rehab.  He is up to 2.3 mph on the treadmill.  We will encourage him to try 4 lb hand weights.  We will continue to monitor his progress. Jermaine Kramer continues to do well. He has increased his treadmill to 2.4 mph and a 1% incline. He is hitting his THR most sessions. He still has not tried 4 lb yet and we will continue to encourage him to increase those. Will continue to monitor. Has not attended since last update.  Jermaine Kramer has had intermitten attendance due to hospital admissions.  He was last here on 5/15 and 5/22.  He was admitted from 5/18-5/20.  Today, he called out with  other appointments and has since been sent to ED for GI distress and extreme weight loss. We will continue to follow. Jermaine Kramer returned for the first time since 5/22 since being admitted in the hospital. He experienced significant weight loss due to the roto virus and only worked on the XR at a light load of level 1. We will continue to work with him and slowly get back into his exercise routine while monitoring any symptoms. Jermaine Kramer is still recovering from the hospital and still somewhat better but still feels some SOB. He is trying to still walk at home,  about 10-15 minutes at a time. As he recovers and feels better, I encouraged to increase his duration slowly. He states his legs get tired and encouraged to take breaks in between. A goal to reach is to minimize those  breaks in between, increase duration as he gets stronger. He has a pulse ox at home and we discussed watching his HR and reviewed his THR.   Expected Outcomes Short: Try 4lb handweights Long: Continue to improve stamina Short: Increase to 4 lb resistance training Long: Continue to increase overall MET level -- Short: Get back into exercise routine Long: Continue to build up strength and stamina Short: Take breaks during walking, minimize over time and increase duration of walks Long: Exercise independently at home for full 30 minutes            Discharge Exercise Prescription (Final Exercise Prescription Changes):  Exercise Prescription Changes - 10/18/21 1600       Response to Exercise   Blood Pressure (Admit) 96/60    Blood Pressure (Exit) 94/56    Heart Rate (Admit) 81 bpm    Heart Rate (Exercise) 101 bpm    Heart Rate (Exit) 81 bpm    Rating of Perceived Exertion (Exercise) 13    Symptoms significant weight loss due to roto virus    Comments return back from hospital    Duration Continue with 30 min of aerobic exercise without signs/symptoms of physical distress.    Intensity THRR unchanged      Progression   Progression Continue to progress workloads to maintain intensity without signs/symptoms of physical distress.    Average METs 4.8      Resistance Training   Training Prescription Yes    Weight 3 lb    Reps 10-15      Interval Training   Interval Training No      REL-XR   Level 1    Minutes 15    METs 4.7      Home Exercise Plan   Plans to continue exercise at Home (comment)   walking, staff vidoes   Frequency Add 2 additional days to program exercise sessions.    Initial Home Exercises Provided 08/29/21      Oxygen   Maintain Oxygen Saturation 88% or higher             Nutrition:  Target Goals: Understanding of nutrition guidelines, daily intake of sodium <1561m, cholesterol <20106m calories 30% from fat and 7% or less from saturated fats, daily to  have 5 or more servings of fruits and vegetables.  Education: All About Nutrition: -Group instruction provided by verbal, written material, interactive activities, discussions, models, and posters to present general guidelines for heart healthy nutrition including fat, fiber, MyPlate, the role of sodium in heart healthy nutrition, utilization of the nutrition label, and utilization of this knowledge for meal planning. Follow up email sent as well. Written material  given at graduation. Flowsheet Row Cardiac Rehab from 07/19/2021 in [facility] Cardiac and Pulmonary Rehab  Education need identified 07/19/21       Biometrics:  Pre Biometrics - 07/19/21 1441       Pre Biometrics   Height _0  (1.727 m)    Weight 191 lb 4.8 oz (86.8 kg)    BMI (Calculated) 29.09    Single Leg Stand 9.63 seconds              Nutrition Therapy Plan and Nutrition Goals:  Nutrition Therapy & Goals - 08/01/21 1043       Nutrition Therapy   RD appointment deferred Yes   Pt would not like to speak with RD at this time, will continue to follow up.     Personal Nutrition Goals   Nutrition Goal Pt would not like to speak with RD at this time, will continue to follow up.             Nutrition Assessments:  MEDIFICTS Score Key: ?70 Need to make dietary changes  40-70 Heart Healthy Diet ? 40 Therapeutic Level Cholesterol Diet  Flowsheet Row Cardiac Rehab from 07/19/2021 in [facility] Cardiac and Pulmonary Rehab  Picture Your Plate Total Score on Admission 66      Picture Your Plate Scores: <68 Unhealthy dietary pattern with much room for improvement. 41-50 Dietary pattern unlikely to meet recommendations for good health and room for improvement. 51-60 More healthful dietary pattern, with some room for improvement.  >60 Healthy dietary pattern, although there may be some specific behaviors that could be improved.    Nutrition Goals Re-Evaluation:  Nutrition Goals Re-Evaluation     [place] Name 08/29/21  1018 10/31/21 1020           Goals   Nutrition Goal Continues to decline nurtition appt Continues to decline nutrition appt      Comment Jermaine Kramer is doing well with his diet.  He keeps a close eye on his salt intake and aims for a good variety.  He is trying to get in lots of fruits and vegetables. --      Expected Outcome Continue to focus on heart healthy diet --               Nutrition Goals Discharge (Final Nutrition Goals Re-Evaluation):  Nutrition Goals Re-Evaluation - 10/31/21 1020       Goals   Nutrition Goal Continues to decline nutrition appt             Psychosocial: Target Goals: Acknowledge presence or absence of significant depression and/or stress, maximize coping skills, provide positive support system. Participant is able to verbalize types and ability to use techniques and skills needed for reducing stress and depression.   Education: Stress, Anxiety, and Depression - Group verbal and visual presentation to define topics covered.  Reviews how body is impacted by stress, anxiety, and depression.  Also discusses healthy ways to reduce stress and to treat/manage anxiety and depression.  Written material given at graduation.   Education: Sleep Hygiene -Provides group verbal and written instruction about how sleep can affect your health.  Define sleep hygiene, discuss sleep cycles and impact of sleep habits. Review good sleep hygiene tips.    Initial Review & Psychosocial Screening:  Initial Psych Review & Screening - 07/11/21 1036       Initial Review   Current issues with None Identified      Family Dynamics   Good Support System? Yes  Comments He can look to his wife and children for support. He has a positve outlook on his health.      Barriers   Psychosocial barriers to participate in program The patient should benefit from training in stress management and relaxation.;There are no identifiable barriers or psychosocial needs.      Screening  Interventions   Interventions Encouraged to exercise;To provide support and resources with identified psychosocial needs;Program counselor consult;Provide feedback about the scores to participant    Expected Outcomes Short Term goal: Utilizing psychosocial counselor, staff and physician to assist with identification of specific Stressors or current issues interfering with healing process. Setting desired goal for each stressor or current issue identified.;Long Term Goal: Stressors or current issues are controlled or eliminated.;Short Term goal: Identification and review with participant of any Quality of Life or Depression concerns found by scoring the questionnaire.;Long Term goal: The participant improves quality of Life and PHQ9 Scores as seen by post scores and/or verbalization of changes             Quality of Life Scores:   Quality of Life - 07/19/21 1440       Quality of Life   Select Quality of Life      Quality of Life Scores   Health/Function Pre 24.87 %    Socioeconomic Pre 30 %    Psych/Spiritual Pre 30 %    Family Pre 28.8 %    GLOBAL Pre 27.56 %            Scores of 19 and below usually indicate a poorer quality of life in these areas.  A difference of  2-3 points is a clinically meaningful difference.  A difference of 2-3 points in the total score of the Quality of Life Index has been associated with significant improvement in overall quality of life, self-image, physical symptoms, and general health in studies assessing change in quality of life.  PHQ-9: Review Flowsheet       09/21/2021 07/19/2021 06/30/2021 06/02/2021  Depression screen PHQ 2/9  Decreased Interest 0 0 0 0  Down, Depressed, Hopeless 0 0 0 0  PHQ - 2 Score 0 0 0 0  Altered sleeping - 1 - -  Tired, decreased energy - 1 - -  Change in appetite - 0 - -  Feeling bad or failure about yourself  - 0 - -  Trouble concentrating - 0 - -  Moving slowly or fidgety/restless - 0 - -  Suicidal thoughts - 0 -  -  PHQ-9 Score - 2 - -  Difficult doing work/chores - Not difficult at all - -   Interpretation of Total Score  Total Score Depression Severity:  1-4 = Minimal depression, 5-9 = Mild depression, 10-14 = Moderate depression, 15-19 = Moderately severe depression, 20-27 = Severe depression   Psychosocial Evaluation and Intervention:  Psychosocial Evaluation - 07/11/21 1037       Psychosocial Evaluation & Interventions   Interventions Encouraged to exercise with the program and follow exercise prescription;Relaxation education;Stress management education    Comments He can look to his wife and children for support. He has a positve outlook on his health.    Expected Outcomes Short: Start [facility] to help with mood. Long: Maintain a healthy mental state    Continue Psychosocial Services  Follow up required by staff             Psychosocial Re-Evaluation:  Psychosocial Re-Evaluation     [place] Name 08/15/21 1017 08/29/21 1017  10/31/21 1018         Psychosocial Re-Evaluation   Current issues with None Identified None Identified None Identified     Comments No new sleep or mental health/stress concerns reported. Jermaine Kramer is doing well in rehab.  He denies any major stressors currently and sleeps well most days. He is enjoying coming to rehab and has really liked having the seated equipment to get moving again as walking is a little more difficult for him. Jermaine Kramer is doing well despite him recovering from the rotavirus. He was recently discharged from the hospial. Overall, he states his sleep is great- he may wake up once during the night but can fall right back asleep. He is supposed to get cataract surgery in August as it has been delayed due to his other medical problems. Did inform him that he would need to wait at least 1 week before returning to rehab. He has good support at home ,esepcially his wife. He declines any problems with depression/anxiety. He has several follow up appts next couple of  weeks he looks forward to going to.     Expected Outcomes Short: continue to attend cardiac rehab for mental health benefits related to exercise. Long: Maintain good mental health habits and sleep patterens. Short: Continue to exercise to stay loose and feel good Long: continue to stay positive Short: Continue good attendance with rehab and attend all follow up appointments with providers Long: Continue to maintain posititive attitude     Interventions Encouraged to attend Cardiac Rehabilitation for the exercise Encouraged to attend Cardiac Rehabilitation for the exercise Encouraged to attend Cardiac Rehabilitation for the exercise     Continue Psychosocial Services  Follow up required by staff -- Follow up required by staff              Psychosocial Discharge (Final Psychosocial Re-Evaluation):  Psychosocial Re-Evaluation - 10/31/21 1018       Psychosocial Re-Evaluation   Current issues with None Identified    Comments Jermaine Kramer is doing well despite him recovering from the rotavirus. He was recently discharged from the hospial. Overall, he states his sleep is great- he may wake up once during the night but can fall right back asleep. He is supposed to get cataract surgery in August as it has been delayed due to his other medical problems. Did inform him that he would need to wait at least 1 week before returning to rehab. He has good support at home ,esepcially his wife. He declines any problems with depression/anxiety. He has several follow up appts next couple of weeks he looks forward to going to.    Expected Outcomes Short: Continue good attendance with rehab and attend all follow up appointments with providers Long: Continue to maintain posititive attitude    Interventions Encouraged to attend Cardiac Rehabilitation for the exercise    Continue Psychosocial Services  Follow up required by staff             Vocational Rehabilitation: Provide vocational rehab assistance to qualifying  candidates.   Vocational Rehab Evaluation & Intervention:   Education: Education Goals: Education classes will be provided on a variety of topics geared toward better understanding of heart health and risk factor modification. Participant will state understanding/return demonstration of topics presented as noted by education test scores.  Learning Barriers/Preferences:  Learning Barriers/Preferences - 07/11/21 1035       Learning Barriers/Preferences   Learning Barriers None    Learning Preferences None  General Cardiac Education Topics:  AED/CPR: - Group verbal and written instruction with the use of models to demonstrate the basic use of the AED with the basic ABC's of resuscitation.   Anatomy and Cardiac Procedures: - Group verbal and visual presentation and models provide information about basic cardiac anatomy and function. Reviews the testing methods done to diagnose heart disease and the outcomes of the test results. Describes the treatment choices: Medical Management, Angioplasty, or Coronary Bypass Surgery for treating various heart conditions including Myocardial Infarction, Angina, Valve Disease, and Cardiac Arrhythmias.  Written material given at graduation.   Medication Safety: - Group verbal and visual instruction to review commonly prescribed medications for heart and lung disease. Reviews the medication, class of the drug, and side effects. Includes the steps to properly store meds and maintain the prescription regimen.  Written material given at graduation.   Intimacy: - Group verbal instruction through game format to discuss how heart and lung disease can affect sexual intimacy. Written material given at graduation..   Know Your Numbers and Heart Failure: - Group verbal and visual instruction to discuss disease risk factors for cardiac and pulmonary disease and treatment options.  Reviews associated critical values for Overweight/Obesity,  Hypertension, Cholesterol, and Diabetes.  Discusses basics of heart failure: signs/symptoms and treatments.  Introduces Heart Failure Zone chart for action plan for heart failure.  Written material given at graduation.   Infection Prevention: - Provides verbal and written material to individual with discussion of infection control including proper hand washing and proper equipment cleaning during exercise session. Flowsheet Row Cardiac Rehab from 07/11/2021 in [facility] Cardiac and Pulmonary Rehab  Date 07/11/21  Educator [facility]  Instruction Review Code 1- Verbalizes Understanding       Falls Prevention: - Provides verbal and written material to individual with discussion of falls prevention and safety. Flowsheet Row Cardiac Rehab from 07/11/2021 in [facility] Cardiac and Pulmonary Rehab  Date 07/11/21  Educator [facility]  Instruction Review Code 1- Verbalizes Understanding       Other: -Provides group and verbal instruction on various topics (see comments)   Knowledge Questionnaire Score:  Knowledge Questionnaire Score - 07/19/21 1434       Knowledge Questionnaire Score   Pre Score 23/26: Nutrition, Exercise             Core Components/Risk Factors/Patient Goals at Admission:  Personal Goals and Risk Factors at Admission - 07/19/21 1531       Core Components/Risk Factors/Patient Goals on Admission    Weight Management Yes;Weight Loss    Intervention Weight Management: Develop a combined nutrition and exercise program designed to reach desired caloric intake, while maintaining appropriate intake of nutrient and fiber, sodium and fats, and appropriate energy expenditure required for the weight goal.;Weight Management: Provide education and appropriate resources to help participant work on and attain dietary goals.;Weight Management/Obesity: Establish reasonable short term and long term weight goals.    Admit Weight 191 lb (86.6 kg)    Goal Weight: Short Term 187 lb (84.8 kg)    Goal Weight:  Long Term 180 lb (81.6 kg)    Expected Outcomes Short Term: Continue to assess and modify interventions until short term weight is achieved;Long Term: Adherence to nutrition and physical activity/exercise program aimed toward attainment of established weight goal;Weight Loss: Understanding of general recommendations for a balanced deficit meal plan, which promotes 1-2 lb weight loss per week and includes a negative energy balance of [phone] kcal/d;Understanding recommendations for meals to include 15-35% energy as  protein, 25-35% energy from fat, 35-60% energy from carbohydrates, less than 236m of dietary cholesterol, 20-35 gm of total fiber daily;Understanding of distribution of calorie intake throughout the day with the consumption of 4-5 meals/snacks    Diabetes Yes    Intervention Provide education about signs/symptoms and action to take for hypo/hyperglycemia.;Provide education about proper nutrition, including hydration, and aerobic/resistive exercise prescription along with prescribed medications to achieve blood glucose in normal ranges: Fasting glucose 65-99 mg/dL    Expected Outcomes Short Term: Participant verbalizes understanding of the signs/symptoms and immediate care of hyper/hypoglycemia, proper foot care and importance of medication, aerobic/resistive exercise and nutrition plan for blood glucose control.;Long Term: Attainment of HbA1C < 7%.    Heart Failure Yes    Intervention Provide a combined exercise and nutrition program that is supplemented with education, support and counseling about heart failure. Directed toward relieving symptoms such as shortness of breath, decreased exercise tolerance, and extremity edema.    Expected Outcomes Improve functional capacity of life;Short term: Attendance in program 2-3 days a week with increased exercise capacity. Reported lower sodium intake. Reported increased fruit and vegetable intake. Reports medication compliance.;Short term: Daily weights  obtained and reported for increase. Utilizing diuretic protocols set by physician.;Long term: Adoption of self-care skills and reduction of barriers for early signs and symptoms recognition and intervention leading to self-care maintenance.    Hypertension Yes    Intervention Provide education on lifestyle modifcations including regular physical activity/exercise, weight management, moderate sodium restriction and increased consumption of fresh fruit, vegetables, and low fat dairy, alcohol moderation, and smoking cessation.;Monitor prescription use compliance.    Expected Outcomes Short Term: Continued assessment and intervention until BP is < 140/962mHG in hypertensive participants. < 130/8041mG in hypertensive participants with diabetes, heart failure or chronic kidney disease.;Long Term: Maintenance of blood pressure at goal levels.    Lipids Yes    Intervention Provide education and support for participant on nutrition & aerobic/resistive exercise along with prescribed medications to achieve LDL <20m20mDL >40mg69m Expected Outcomes Short Term: Participant states understanding of desired cholesterol values and is compliant with medications prescribed. Participant is following exercise prescription and nutrition guidelines.;Long Term: Cholesterol controlled with medications as prescribed, with individualized exercise RX and with personalized nutrition plan. Value goals: LDL < 20mg,80m > 40 mg.             Education:Diabetes - Individual verbal and written instruction to review signs/symptoms of diabetes, desired ranges of glucose level fasting, after meals and with exercise. Acknowledge that pre and post exercise glucose checks will be done for 3 sessions at entry of program. [place]  Date 07/11/21  Educator [facility] Review Code 1- Verbalizes Understanding       Core Components/Risk Factors/Patient Goals  Review:   Goals and Risk Factor Review     Row Name 08/15/21 1021 08/29/21 1019 10/31/21 1015         Core Components/Risk Factors/Patient Goals Review   Personal Goals Review Weight Management/Obesity;Heart Failure;Diabetes;Hypertension;Lipids Weight Management/Obesity;Heart Failure;Diabetes;Hypertension;Lipids Weight Management/Obesity;Heart Failure;Diabetes;Hypertension     Review Patient reports montioring weight, blood pressure, and blood sugar at home. All levels were reported to be within normal range. Patient is taking all medications as prescribed. Weight has beeen consistent upon monitoring. Jermaine Kramer isUlice Dashing well in rehab.  He watches his weight closely and will take extra lasix if it is up or if he notices any  swelling or increased SOB.  He had a follow up last week with cardiology and got a good report.  His doctor is getting ready to leave so he will need a new cardiologist after July.  His pressures are doing well and he continues to check them at home.  He is good about checking his sugars each morning and it is usually aound 135 mg/dl each day.  He writes it down along with his pressures each day. Jermaine Kramer is recovering again from the hospital as he had rotavirus. His weight is down to 178-180 lb. He lost about 20 lbs from the hospital and is trying to maintain at this time. They changed his diuretic to torsemide and stays compliant taking it. He also checks his weight everyday and knows to report any abnormal weight gain for potential fluid issues. He sees his cardiologist on Wednesday for follow up and see Jackelyn Hoehn for heart failure follow up next week. Then, he sees PCP the week after that. He hopes for good reports. He is still checking  his blood sugars which range around 110-140 every morning. He states since being out of the hospital his BP at rehab has been on the lower end, declines symptoms but plans to bring attentiion to it as his follow up appts this week. He keep an ongoing log on  his weight, BP     Expected Outcomes Short: continue to take all meds and monitor BP, blood sugar, and weight at home. Long: continue to control cardiac risk factors with heart healthy lifestyle. Short: Conitnue to work on weight loss Long: Conitnue to manage heart failure. Short: Talk to doctor about BP and weight, continue to watch closely at home Long: Manage heart failure and other risk factors              Core Components/Risk Factors/Patient Goals at Discharge (Final Review):   Goals and Risk Factor Review - 10/31/21 1015       Core Components/Risk Factors/Patient Goals Review   Personal Goals Review Weight Management/Obesity;Heart Failure;Diabetes;Hypertension    Review Jermaine Kramer is recovering again from the hospital as he had rotavirus. His weight is down to 178-180 lb. He lost about 20 lbs from the hospital and is trying to maintain at this time. They changed his diuretic to torsemide and stays compliant taking it. He also checks his weight everyday and knows to report any abnormal weight gain for potential fluid issues. He sees his cardiologist on Wednesday for follow up and see Jackelyn Hoehn for heart failure follow up next week. Then, he sees PCP the week after that. He hopes for good reports. He is still checking  his blood sugars which range around 110-140 every morning. He states since being out of the hospital his BP at rehab has been on the lower end, declines symptoms but plans to bring attentiion to it as his follow up appts this week. He keep an ongoing log on his weight, BP    Expected Outcomes Short: Talk to doctor about BP and weight, continue to watch closely at home Long: Manage heart failure and other risk factors             ITP Comments:  ITP Comments     Row Name 07/11/21 1034 07/19/21 1431 08/01/21 1045 08/17/21 1414 09/14/21 0919   ITP Comments Virtual Visit completed. Patient informed on EP and RD appointment and 6 Minute walk test. Patient also informed of patient  health questionnaires on My Chart. Patient [employer]  understanding. Visit diagnosis can be found in [facility] 02/14/2021. Completed 6MWT and gym orientation. Initial ITP created and sent for review to Dr. Emily Filbert, Medical Director. First full day of exercise!  Patient was oriented to gym and equipment including functions, settings, policies, and procedures.  Patient's individual exercise prescription and treatment plan were reviewed.  All starting workloads were established based on the results of the 6 minute walk test done at initial orientation visit.  The plan for exercise progression was also introduced and progression will be customized based on patient's performance and goals. 30 Day review completed. Medical Director ITP review done, changes made as directed, and signed approval by Medical Director. 30 Day review completed. Medical Director ITP review done, changes made as directed, and signed approval by Medical Director.    [place] Name 10/03/21 1547 10/12/21 1351 11/09/21 1032       ITP Comments Jermaine Kramer has had intermitten attendance due to hospital admissions.  He was last here on 5/15 and 5/22.  He was admitted from 5/18-5/20.  Today, he called out with other appointments and has since been sent to ED for GI distress and extreme weight loss. We will continue to follow. 30 Day review completed. Medical Director ITP review done, changes made as directed, and signed approval by Medical Director. 30 Day review completed. Medical Director ITP review done, changes made as directed, and signed approval by Medical Director.              Comments:

## 2021-11-10 ENCOUNTER — Encounter: Payer: Self-pay | Admitting: Family

## 2021-11-10 ENCOUNTER — Ambulatory Visit (HOSPITAL_BASED_OUTPATIENT_CLINIC_OR_DEPARTMENT_OTHER): Payer: Medicare Other | Admitting: Family

## 2021-11-10 ENCOUNTER — Telehealth: Payer: Self-pay | Admitting: Family

## 2021-11-10 ENCOUNTER — Other Ambulatory Visit
Admission: RE | Admit: 2021-11-10 | Discharge: 2021-11-10 | Disposition: A | Discharge status: Home / Self Care | Payer: Medicare Other | Source: Ambulatory Visit | Attending: Family | Admitting: Family

## 2021-11-10 ENCOUNTER — Encounter: Payer: Self-pay | Admitting: Cardiology

## 2021-11-10 VITALS — BP 117/74 | HR 70 | Resp 16 | Ht 65.0 in | Wt 182.4 lb

## 2021-11-10 DIAGNOSIS — K219 Gastro-esophageal reflux disease without esophagitis: Secondary | ICD-10-CM | POA: Insufficient documentation

## 2021-11-10 DIAGNOSIS — Z951 Presence of aortocoronary bypass graft: Secondary | ICD-10-CM | POA: Insufficient documentation

## 2021-11-10 DIAGNOSIS — I255 Ischemic cardiomyopathy: Secondary | ICD-10-CM

## 2021-11-10 DIAGNOSIS — Z95810 Presence of automatic (implantable) cardiac defibrillator: Secondary | ICD-10-CM | POA: Insufficient documentation

## 2021-11-10 DIAGNOSIS — Z79899 Other long term (current) drug therapy: Secondary | ICD-10-CM | POA: Insufficient documentation

## 2021-11-10 DIAGNOSIS — R531 Weakness: Secondary | ICD-10-CM | POA: Insufficient documentation

## 2021-11-10 DIAGNOSIS — R0602 Shortness of breath: Secondary | ICD-10-CM | POA: Insufficient documentation

## 2021-11-10 DIAGNOSIS — Z7901 Long term (current) use of anticoagulants: Secondary | ICD-10-CM | POA: Insufficient documentation

## 2021-11-10 DIAGNOSIS — Z7984 Long term (current) use of oral hypoglycemic drugs: Secondary | ICD-10-CM | POA: Insufficient documentation

## 2021-11-10 DIAGNOSIS — I10 Essential (primary) hypertension: Secondary | ICD-10-CM

## 2021-11-10 DIAGNOSIS — I5022 Chronic systolic (congestive) heart failure: Secondary | ICD-10-CM | POA: Insufficient documentation

## 2021-11-10 DIAGNOSIS — I480 Paroxysmal atrial fibrillation: Secondary | ICD-10-CM | POA: Diagnosis not present

## 2021-11-10 DIAGNOSIS — E119 Type 2 diabetes mellitus without complications: Secondary | ICD-10-CM | POA: Insufficient documentation

## 2021-11-10 DIAGNOSIS — E1122 Type 2 diabetes mellitus with diabetic chronic kidney disease: Secondary | ICD-10-CM

## 2021-11-10 DIAGNOSIS — R0789 Other chest pain: Secondary | ICD-10-CM | POA: Insufficient documentation

## 2021-11-10 DIAGNOSIS — I110 Hypertensive heart disease with heart failure: Secondary | ICD-10-CM | POA: Insufficient documentation

## 2021-11-10 DIAGNOSIS — I4891 Unspecified atrial fibrillation: Secondary | ICD-10-CM | POA: Insufficient documentation

## 2021-11-10 DIAGNOSIS — E785 Hyperlipidemia, unspecified: Secondary | ICD-10-CM | POA: Insufficient documentation

## 2021-11-10 DIAGNOSIS — N1832 Chronic kidney disease, stage 3b: Secondary | ICD-10-CM

## 2021-11-10 DIAGNOSIS — Z8673 Personal history of transient ischemic attack (TIA), and cerebral infarction without residual deficits: Secondary | ICD-10-CM | POA: Insufficient documentation

## 2021-11-10 DIAGNOSIS — I2510 Atherosclerotic heart disease of native coronary artery without angina pectoris: Secondary | ICD-10-CM | POA: Insufficient documentation

## 2021-11-10 LAB — BASIC METABOLIC PANEL
Anion gap: 10 (ref 5–15)
BUN: 30 mg/dL — ABNORMAL HIGH (ref 8–23)
CO2: 23 mmol/L (ref 22–32)
Calcium: 9 mg/dL (ref 8.9–10.3)
Chloride: 109 mmol/L (ref 98–111)
Creatinine, Ser: 1.87 mg/dL — ABNORMAL HIGH (ref 0.61–1.24)
GFR, Estimated: 37 mL/min — ABNORMAL LOW (ref >60)
Glucose, Bld: 99 mg/dL (ref 70–99)
Potassium: 4.7 mmol/L (ref 3.5–5.1)
Sodium: 142 mmol/L (ref 135–145)

## 2021-11-10 NOTE — Telephone Encounter (Signed)
Spoke with patient's wife regarding BMP results obtained earlier today. Sodium/potassium are normal but renal function has declined some to a GFR of 37. Advised wife to continue all medications and that I would send results to his PCP for possible reduction of his metformin.

## 2021-11-10 NOTE — Patient Instructions (Signed)
Continue weighing daily and call for an overnight weight gain of 3 pounds or more or a weekly weight gain of more than 5 pounds.   If you have voicemail, please make sure your mailbox is cleaned out so that we may leave a message and please make sure to listen to any voicemails.     

## 2021-11-10 NOTE — Progress Notes (Signed)
Patient ID: Jermaine Kramer, male    DOB: 22-Jul-1950, 71 y.o.   MRN: [id number]  HPI  Jermaine Kramer is a 71 y/o male with a history of leukemia, DM, CAD (CABG 02/16/21), AF, sleep apnea, hyperlipidemia, HTN, stroke, GERD & chronic heart failure.   Echo report from 09/16/21 reviewed and showed an EF of 30-35% along with mild Jermaine. Echo report from 05/16/21 reviewed and showed and EF of <20% along with mild/moderate LAE and mild/moderate Jermaine.   Admitted 10/04/21 due to severe nausea diarrhea and poor oral intake. IVF hydration gently done. Hypokalemia corrected. Able to keep PO fluids done and he was released. Discharged after 3 days. Was in the ED 10/03/21 due to nausea, vomiting and diarrhea along with abdominal pain. IVF given. Abdominal CT negative. + for rotavirus. Felt better and he was released. Admitted 09/15/21 due to worsening SOB and weight gain due to acute on chronic HF. Given IV lasix with transition to oral diuretics. Cardiology consult obtained. ACEi deferred to renal function. Discharged after 2 days. Admitted 07/24/21 due to  worsening left flank pain today, which is constant, sharp, 8 out of 10 in severity, nonradiating. CT of lumbar spine is negative for acute injury, but showed degenerative disc disease.  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 Urology and GI consults obtained. Discharged after 3 days. Was in the ED 07/21/21 due to left sided flank pain. CT as positive for a kidney stone that is in the left kidney, nonobstructing. He was released with pain medication. AICD implanted 06/22/21. Admitted 05/14/21 due to lower extremity edema, increasing shortness of breath, cough and wheezing. Initially given IV lasix with transition to oral diuretics. Cardiology consult obtained. Discharged after 4 days.   He presents today for a follow-up visit with a chief complaint of moderate fatigue with minimal exertion. Describes this as chronic in nature. He has  associated intermittent chest tightness, cough, shortness of breath and weakness along with this. He denies any difficulty sleeping, abdominal distention, palpitations, pedal edema, chest pain, wheezing, dizziness or weight gain.   Saw PCP earlier today and there's been discussion about decreasing his metformin due to his renal function and beginning trulicity. Had yeast infection with SGLT2.   A couple of days ago, he said that he had a bad day and thought he was going to have to come to the hospital. Michela Pitcher he had a lot of chest tightness with worsening SOB but then said that his symptoms improved on their own.   Past Medical History:  Diagnosis Date   Arrhythmia    atrial fibrillation   CHF (congestive heart failure) (HCC)    Coronary artery disease    Diabetes mellitus without complication (HCC)    GERD (gastroesophageal reflux disease)    Hypercholesteremia    Hypertension    Sleep apnea    Stroke [facility])    Past Surgical History:  Procedure Laterality Date   CHOLECYSTECTOMY     COLONOSCOPY WITH PROPOFOL N/A 04/14/2019   Procedure: COLONOSCOPY WITH PROPOFOL;  Surgeon: Toledo, Benay Pike, MD;  Location: [facility] ENDOSCOPY;  Service: Gastroenterology;  Laterality: N/A;   CORONARY ARTERY BYPASS GRAFT  02/16/2021   ICD IMPLANT N/A 06/22/2021   Procedure: ICD IMPLANT;  Surgeon: Vickie Epley, MD;  Location: [place] [facility];  Service: Cardiovascular;  Laterality: N/A;   KNEE ARTHROSCOPY     RENAL CYST EXCISION     Family History  Problem Relation Age of Onset  Parkinson's disease Mother    Lupus Mother    Heart disease Father    Social History   Tobacco Use   Smoking status: Never   Smokeless tobacco: Never  Substance Use Topics   Alcohol use: Not Currently   Allergies  Allergen Reactions   Doxycycline Anaphylaxis    Patient does not recall having this reaction.     Heparin Other (See Comments)    heparin-induced thrombocytopenia    Empagliflozin Itching    Groin  infection Caused a groin infection.     Prior to Admission medications   Medication Sig Start Date End Date Taking? Authorizing Provider  amiodarone (PACERONE) 200 MG tablet Take 200 mg by mouth daily. 03/06/21  Yes [provider]  apixaban (ELIQUIS) 5 MG TABS tablet Take 1 tablet (5 mg total) by mouth 2 (two) times daily. DO NOT RESTART UNTIL 06/27/2021. 06/22/21  Yes Dunn, Areta Haber, PA-C  aspirin EC 81 MG tablet Take 81 mg by mouth daily.   Yes [provider]  atorvastatin (LIPITOR) 80 MG tablet Take 80 mg by mouth daily. 02/10/21  Yes [provider]  glucose blood (PRECISION QID TEST) test strip Use 3 (three) times daily Use as instructed. One touch ultra 07/05/21 07/05/22 Yes [provider]  metFORMIN (GLUCOPHAGE) 1000 MG tablet Take 1,000 mg by mouth 2 (two) times daily with a meal.   Yes [provider]  metoprolol succinate (TOPROL-XL) 25 MG 24 hr tablet Take 0.5 tablets (12.5 mg total) by mouth every evening. 10/11/21  Yes Eleah Lahaie, Otila Kluver A, FNP  omeprazole (PRILOSEC) 40 MG capsule Take 40 mg by mouth 2 (two) times daily. 03/11/21  Yes [provider]  ondansetron (ZOFRAN) 4 MG tablet Take 4 mg by mouth every 8 (eight) hours as needed. 09/13/21  Yes [provider]  spironolactone (ALDACTONE) 25 MG tablet Take 12.5 mg by mouth daily. 09/20/21  Yes [provider]  tamsulosin (FLOMAX) 0.4 MG CAPS capsule Take 0.8 mg by mouth in the morning and at bedtime.   Yes [provider]  torsemide (DEMADEX) 20 MG tablet Take 1 tablet (20 mg total) by mouth daily. And additional '20mg'$  if needed 09/30/21  Yes Alisa Graff, FNP   Review of Systems  Constitutional:  Positive for fatigue (easily). Negative for appetite change.  HENT:  Negative for congestion, postnasal drip and sore throat.   Eyes: Negative.   Respiratory:  Positive for cough, chest tightness and shortness of breath (easily). Negative for wheezing.    Cardiovascular:  Negative for chest pain, palpitations and leg swelling.  Gastrointestinal:  Negative for abdominal distention and abdominal pain.  Endocrine: Negative.   Genitourinary: Negative.   Musculoskeletal:  Negative for back pain and neck pain.  Skin: Negative.   Allergic/Immunologic: Negative.   Neurological:  Positive for weakness. Negative for dizziness and light-headedness.  Hematological:  Negative for adenopathy. Does not bruise/bleed easily.  Psychiatric/Behavioral:  Negative for dysphoric mood and sleep disturbance (sleeping on 2 pillows). The patient is not nervous/anxious.    Vitals:   11/10/21 1045  BP: 117/74  Pulse: 70  Resp: 16  SpO2: 99%  Weight: 182 lb 6 oz (82.7 kg)  Height: '5\' 5"'$  (1.651 m)   Wt Readings from Last 3 Encounters:  11/10/21 182 lb 6 oz (82.7 kg)  10/11/21 181 lb 2 oz (82.2 kg)  10/04/21 171 lb (77.6 kg)   Lab Results  Component Value Date   CREATININE 1.67 (H) 10/11/2021  CREATININE 1.24 10/07/2021   CREATININE 1.40 (H) 10/06/2021   Physical Exam Vitals and nursing note reviewed. Exam conducted with a chaperone present (wife).  Constitutional:      Appearance: Normal appearance.  HENT:     Head: Normocephalic and atraumatic.  Cardiovascular:     Rate and Rhythm: Normal rate and regular rhythm.  Pulmonary:     Effort: Pulmonary effort is normal. No respiratory distress.     Breath sounds: No wheezing or rales.  Abdominal:     General: There is no distension.     Palpations: Abdomen is soft.     Tenderness: There is no abdominal tenderness.  Musculoskeletal:        General: No tenderness.     Cervical back: Normal range of motion and neck supple.     Right lower leg: No edema.     Left lower leg: No edema.  Skin:    General: Skin is warm and dry.  Neurological:     General: No focal deficit present.     Mental Status: He is alert and oriented to person, place, and time.  Psychiatric:        Mood and Affect: Mood normal.         Behavior: Behavior normal.        Thought Content: Thought content normal.    Assessment & Plan:  1: Chronic heart failure with reduced ejection fraction- - NYHA class III - euvolemic today - weighing daily and home weight chart reviewed; reminded to call for an overnight weight gain of > 2 pounds or a weekly weight gain of >5 pounds - weight up 1.4 pounds from last visit here 1 month ago - not adding "much" salt and has been reading food labels for sodium content; understands to keep daily sodium intake to '2000mg'$  / day - on GDMT of metoprolol and spironolactone; lisinopril still on hold - allergic to empagliflozin with a yeast infection - saw EP Quentin Ore) 09/21/21  - had AICD implanted 06/22/21; no shocks have been delivered - they will call if they have to use the furoscix - will check BMP today; depending on renal function, consider increasing torsemide to 2 tablets daily - if K+ is higher, may have to stop spironolactone - BNP 11/03/21 was 1457.3  2: HTN- - BP looks good (117/74) - saw PCP [name]) earlier today 11/10/21 - BMP 11/03/21 reviewed and showed sodium 139, potassium 5.0, creatinine 1.7 & GFR 40  3: DM- - A1c 08/03/21 was 7.3% - glucose at home today was 126 - PCP is considering decreasing metformin to '500mg'$  BID due to renal function and adding trulicity  4: Atrial fibrillation- - saw cardiology Corky Sox) 11/03/21 - currently on amiodarone, metoprolol and apixaban  5: CAD- - CABG with a LIMA to LAD, SVG to ramus, and SVG to PDA on 02/16/21 - currently participating in cardiac rehab    Medication bottles reviewed.   Return in 2 months, sooner if needed.

## 2021-11-14 ENCOUNTER — Encounter: Payer: Medicare Other | Admitting: *Deleted

## 2021-11-14 DIAGNOSIS — Z951 Presence of aortocoronary bypass graft: Secondary | ICD-10-CM | POA: Diagnosis not present

## 2021-11-14 NOTE — Progress Notes (Signed)
Daily Session Note  Patient Details  Name: Jermaine Kramer MRN: [id number] Date of Birth: 1951/02/17 Referring Provider:   Flowsheet Row Cardiac Rehab from 07/19/2021 in [facility] Cardiac and Pulmonary Rehab  Referring Provider Ernesta Amble MD       Encounter Date: 11/14/2021  Check In:  Session Check In - 11/14/21 1053       Check-In   Supervising physician immediately available to respond to emergencies See telemetry face sheet for immediately available ER MD    Location [name]    Staff Present Heath Lark, RN, BSN, CCRP;Kristen Coble, RN,BC,MSN;Kelly Marlton, BS, ACSM CEP, Exercise Physiologist    Virtual Visit No    Medication changes reported     No    Fall or balance concerns reported    No    Warm-up and Cool-down Performed on first and last piece of equipment    Resistance Training Performed Yes    VAD Patient? No    PAD/SET Patient? No      Pain Assessment   Currently in Pain? No/denies                Social History   Tobacco Use  Smoking Status Never  Smokeless Tobacco Never    Goals Met:  Independence with exercise equipment Exercise tolerated well No report of concerns or symptoms today  Goals Unmet:  Not Applicable  Comments: Pt able to follow exercise prescription today without complaint.  Will continue to monitor for progression.    Dr. Emily Filbert is Medical Director for [place].  Dr. Ottie Glazier is Medical Director for [facility] Pulmonary Rehabilitation.

## 2021-11-16 DIAGNOSIS — Z951 Presence of aortocoronary bypass graft: Secondary | ICD-10-CM

## 2021-11-16 NOTE — Progress Notes (Signed)
Daily Session Note  Patient Details  Name: Jermaine Kramer MRN: [id number] Date of Birth: October 03, 1950 Referring Provider:   Flowsheet Row Cardiac Rehab from 07/19/2021 in [facility] Cardiac and Pulmonary Rehab  Referring Provider Ernesta Amble MD       Encounter Date: 11/16/2021  Check In:  Session Check In - 11/16/21 0951       Check-In   Supervising physician immediately available to respond to emergencies See telemetry face sheet for immediately available ER MD    Location [name]    Staff Present Antionette Fairy, BS, Exercise Physiologist;Kristen Coble, RN,BC,MSN;Susanne Bice, RN, BSN, CCRP;Melissa Caiola, RDN, LDN    Virtual Visit No    Medication changes reported     No    Fall or balance concerns reported    No    Tobacco Cessation No Change    Warm-up and Cool-down Performed on first and last piece of equipment    Resistance Training Performed Yes    VAD Patient? No    PAD/SET Patient? No      Pain Assessment   Currently in Pain? No/denies    Multiple Pain Sites No                Social History   Tobacco Use  Smoking Status Never  Smokeless Tobacco Never    Goals Met:  Independence with exercise equipment Exercise tolerated well No report of concerns or symptoms today  Goals Unmet:  Not Applicable  Comments: Pt able to follow exercise prescription today without complaint.  Will continue to monitor for progression.    Dr. Emily Filbert is Medical Director for [place].  Dr. Ottie Glazier is Medical Director for [facility] Pulmonary Rehabilitation.

## 2021-11-21 ENCOUNTER — Encounter: Payer: Medicare Other | Admitting: *Deleted

## 2021-11-21 DIAGNOSIS — Z951 Presence of aortocoronary bypass graft: Secondary | ICD-10-CM

## 2021-11-21 NOTE — Progress Notes (Signed)
Daily Session Note  Patient Details  Name: Jermaine Kramer MRN: [id number] Date of Birth: Jun 05, 1950 Referring Provider:   Flowsheet Row Cardiac Rehab from 07/19/2021 in [name] Cardiac and Pulmonary Rehab  Referring Provider Ernesta Amble MD       Encounter Date: 11/21/2021  Check In:  Session Check In - 11/21/21 1033       Check-In   Supervising physician immediately available to respond to emergencies See telemetry face sheet for immediately available ER MD    Location [name]    Staff Present Alberteen Sam, MA, RCEP, CCRP, CCET;Takiera Mayo [place], RN Moises Blood, BS, ACSM CEP, Exercise Physiologist    Virtual Visit No    Medication changes reported     No    Fall or balance concerns reported    No    Warm-up and Cool-down Performed on first and last piece of equipment    Resistance Training Performed Yes    VAD Patient? No    PAD/SET Patient? No      Pain Assessment   Currently in Pain? No/denies                Social History   Tobacco Use  Smoking Status Never  Smokeless Tobacco Never    Goals Met:  Independence with exercise equipment Exercise tolerated well No report of concerns or symptoms today Strength training completed today  Goals Unmet:  Not Applicable  Comments: Pt able to follow exercise prescription today without complaint.  Will continue to monitor for progression.    Dr. Emily Filbert is Medical Director for [place].  Dr. Ottie Glazier is Medical Director for [facility] Pulmonary Rehabilitation.

## 2021-11-23 ENCOUNTER — Encounter: Payer: Medicare Other | Admitting: *Deleted

## 2021-11-23 DIAGNOSIS — Z951 Presence of aortocoronary bypass graft: Secondary | ICD-10-CM | POA: Diagnosis not present

## 2021-11-23 NOTE — Progress Notes (Signed)
Daily Session Note  Patient Details  Name: Jermaine Kramer MRN: [id number] Date of Birth: 11/22/1950 Referring Provider:   Flowsheet Row Cardiac Rehab from 07/19/2021 in [facility] Cardiac and Pulmonary Rehab  Referring Provider Ernesta Amble MD       Encounter Date: 11/23/2021  Check In:  Session Check In - 11/23/21 1045       Check-In   Supervising physician immediately available to respond to emergencies See telemetry face sheet for immediately available ER MD    Location [name]    Staff Present Heath Lark, RN, BSN, CCRP;Jessica [place], MA, RCEP, CCRP, [facility], BS, ACSM CEP, Exercise Physiologist    Virtual Visit No    Medication changes reported     No    Fall or balance concerns reported    No    Warm-up and Cool-down Performed on first and last piece of equipment    Resistance Training Performed Yes    VAD Patient? No    PAD/SET Patient? No      Pain Assessment   Currently in Pain? No/denies                Social History   Tobacco Use  Smoking Status Never  Smokeless Tobacco Never    Goals Met:  Independence with exercise equipment Exercise tolerated well No report of concerns or symptoms today  Goals Unmet:  Not Applicable  Comments: Pt able to follow exercise prescription today without complaint.  Will continue to monitor for progression.    Dr. Emily Filbert is Medical Director for [place].  Dr. Ottie Glazier is Medical Director for [facility] Pulmonary Rehabilitation.

## 2021-11-28 ENCOUNTER — Encounter: Payer: Self-pay | Admitting: Family

## 2021-11-28 ENCOUNTER — Encounter: Payer: Medicare Other | Admitting: *Deleted

## 2021-11-28 ENCOUNTER — Ambulatory Visit: Payer: Medicare Other | Attending: Family | Admitting: Family

## 2021-11-28 VITALS — BP 109/70 | HR 69 | Resp 18 | Ht 65.0 in | Wt 181.2 lb

## 2021-11-28 DIAGNOSIS — I480 Paroxysmal atrial fibrillation: Secondary | ICD-10-CM

## 2021-11-28 DIAGNOSIS — I255 Ischemic cardiomyopathy: Secondary | ICD-10-CM | POA: Diagnosis not present

## 2021-11-28 DIAGNOSIS — I110 Hypertensive heart disease with heart failure: Secondary | ICD-10-CM | POA: Diagnosis present

## 2021-11-28 DIAGNOSIS — Z95810 Presence of automatic (implantable) cardiac defibrillator: Secondary | ICD-10-CM | POA: Diagnosis not present

## 2021-11-28 DIAGNOSIS — Z951 Presence of aortocoronary bypass graft: Secondary | ICD-10-CM | POA: Diagnosis not present

## 2021-11-28 DIAGNOSIS — I10 Essential (primary) hypertension: Secondary | ICD-10-CM | POA: Diagnosis not present

## 2021-11-28 DIAGNOSIS — I4891 Unspecified atrial fibrillation: Secondary | ICD-10-CM | POA: Diagnosis not present

## 2021-11-28 DIAGNOSIS — I2510 Atherosclerotic heart disease of native coronary artery without angina pectoris: Secondary | ICD-10-CM | POA: Insufficient documentation

## 2021-11-28 DIAGNOSIS — E119 Type 2 diabetes mellitus without complications: Secondary | ICD-10-CM | POA: Diagnosis not present

## 2021-11-28 DIAGNOSIS — E1122 Type 2 diabetes mellitus with diabetic chronic kidney disease: Secondary | ICD-10-CM | POA: Diagnosis not present

## 2021-11-28 DIAGNOSIS — I5022 Chronic systolic (congestive) heart failure: Secondary | ICD-10-CM | POA: Insufficient documentation

## 2021-11-28 DIAGNOSIS — Z7901 Long term (current) use of anticoagulants: Secondary | ICD-10-CM | POA: Insufficient documentation

## 2021-11-28 DIAGNOSIS — N1832 Chronic kidney disease, stage 3b: Secondary | ICD-10-CM

## 2021-11-28 MED ORDER — APIXABAN 5 MG PO TABS: 5.0000 mg | ORAL_TABLET | Freq: Two times a day (BID) | ORAL | 3 refills | Status: DC

## 2021-11-28 NOTE — Progress Notes (Signed)
Daily Session Note  Patient Details  Name: Jermaine Kramer MRN: [id number] Date of Birth: 06/13/1950 Referring Provider:   Flowsheet Row Cardiac Rehab from 07/19/2021 in [facility] Cardiac and Pulmonary Rehab  Referring Provider Ernesta Amble MD       Encounter Date: 11/28/2021  Check In:  Session Check In - 11/28/21 1044       Check-In   Supervising physician immediately available to respond to emergencies See telemetry face sheet for immediately available ER MD    Location [name]    Staff Present Heath Lark, RN, BSN, Laveda Norman, BS, ACSM CEP, Exercise Physiologist;Meredith Sherryll Burger, RN BSN    Virtual Visit No    Medication changes reported     No    Fall or balance concerns reported    No    Warm-up and Cool-down Performed on first and last piece of equipment    Resistance Training Performed Yes    VAD Patient? No    PAD/SET Patient? No      Pain Assessment   Currently in Pain? No/denies                Social History   Tobacco Use  Smoking Status Never  Smokeless Tobacco Never    Goals Met:  Independence with exercise equipment Exercise tolerated well No report of concerns or symptoms today  Goals Unmet:  Not Applicable  Comments: Pt able to follow exercise prescription today without complaint.  Will continue to monitor for progression.    Dr. Emily Filbert is Medical Director for [place].  Dr. Ottie Glazier is Medical Director for [facility] Pulmonary Rehabilitation.

## 2021-11-28 NOTE — Patient Instructions (Signed)
Continue weighing daily and call for an overnight weight gain of 3 pounds or more or a weekly weight gain of more than 5 pounds.   If you have voicemail, please make sure your mailbox is cleaned out so that we may leave a message and please make sure to listen to any voicemails.     

## 2021-11-28 NOTE — Progress Notes (Signed)
Patient ID: JAQUAVION MCCANNON, male    DOB: 1950/12/10, 71 y.o.   MRN: [id number]  HPI  Mr Archambeau is a 71 y/o male with a history of leukemia, DM, CAD (CABG 02/16/21), AF, sleep apnea, hyperlipidemia, HTN, stroke, GERD & chronic heart failure.   Echo report from 09/16/21 reviewed and showed an EF of 30-35% along with mild MR. Echo report from 05/16/21 reviewed and showed and EF of <20% along with mild/moderate LAE and mild/moderate MR.   Admitted 10/04/21 due to severe nausea diarrhea and poor oral intake. IVF hydration gently done. Hypokalemia corrected. Able to keep PO fluids done and he was released. Discharged after 3 days. Was in the ED 10/03/21 due to nausea, vomiting and diarrhea along with abdominal pain. IVF given. Abdominal CT negative. + for rotavirus. Felt better and he was released. Admitted 09/15/21 due to worsening SOB and weight gain due to acute on chronic HF. Given IV lasix with transition to oral diuretics. Cardiology consult obtained. ACEi deferred to renal function. Discharged after 2 days. Admitted 07/24/21 due to  worsening left flank pain today, which is constant, sharp, 8 out of 10 in severity, nonradiating. CT of lumbar spine is negative for acute injury, but showed degenerative disc disease.  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 Urology and GI consults obtained. Discharged after 3 days. Was in the ED 07/21/21 due to left sided flank pain. CT as positive for a kidney stone that is in the left kidney, nonobstructing. He was released with pain medication. AICD implanted 06/22/21.   He presents today for an urgent visit with a chief complaint of moderate shortness of breath with minimal exertion. Describes this as chronic in nature although had worsened a few days ago. He has associated fatigue, cough, wheezing, weakness and weight gain (now resolved) along with this. He denies any difficulty sleeping, dizziness, abdominal distention,  palpitations, pedal edema or chest pain.   Had to use SQ furoscix yesterday due to weight gain, worsening SOB and abdominal distention. These symptoms have all improved after using furoscix but they wanted to be seen as a follow-up.   His wife asks about eliquis alternative due to the cost. Is aware of warfarin use but understands the need for frequent lab draws. Encouraged them to speak with cardiology about this.    Past Medical History:  Diagnosis Date   Arrhythmia    atrial fibrillation   CHF (congestive heart failure) (HCC)    Coronary artery disease    Diabetes mellitus without complication (HCC)    GERD (gastroesophageal reflux disease)    Hypercholesteremia    Hypertension    Sleep apnea    Stroke [facility])    Past Surgical History:  Procedure Laterality Date   CHOLECYSTECTOMY     COLONOSCOPY WITH PROPOFOL N/A 04/14/2019   Procedure: COLONOSCOPY WITH PROPOFOL;  Surgeon: Toledo, Benay Pike, MD;  Location: [facility] ENDOSCOPY;  Service: Gastroenterology;  Laterality: N/A;   CORONARY ARTERY BYPASS GRAFT  02/16/2021   ICD IMPLANT N/A 06/22/2021   Procedure: ICD IMPLANT;  Surgeon: Vickie Epley, MD;  Location: [place] CV LAB;  Service: Cardiovascular;  Laterality: N/A;   KNEE ARTHROSCOPY     RENAL CYST EXCISION     Family History  Problem Relation Age of Onset   Parkinson's disease Mother    Lupus Mother    Heart disease Father    Social History   Tobacco Use   Smoking status: Never  Smokeless tobacco: Never  Substance Use Topics   Alcohol use: Not Currently   Allergies  Allergen Reactions   Doxycycline Anaphylaxis    Patient does not recall having this reaction.     Heparin Other (See Comments)    heparin-induced thrombocytopenia    Empagliflozin Itching    Groin infection Caused a groin infection.     Prior to Admission medications   Medication Sig Start Date End Date Taking? Authorizing Provider  amiodarone (PACERONE) 200 MG tablet Take 200 mg by  mouth daily. 03/06/21  Yes [provider]  aspirin EC 81 MG tablet Take 81 mg by mouth daily.   Yes [provider]  atorvastatin (LIPITOR) 80 MG tablet Take 80 mg by mouth daily. 02/10/21  Yes [provider]  glucose blood (PRECISION QID TEST) test strip Use 3 (three) times daily Use as instructed. One touch ultra 07/05/21 07/05/22 Yes [provider]  metFORMIN (GLUCOPHAGE) 1000 MG tablet Take 500 mg by mouth 2 (two) times daily with a meal.   Yes [provider]  metoprolol succinate (TOPROL-XL) 25 MG 24 hr tablet Take 0.5 tablets (12.5 mg total) by mouth every evening. 10/11/21  Yes Syed Zukas, Otila Kluver A, FNP  omeprazole (PRILOSEC) 40 MG capsule Take 40 mg by mouth 2 (two) times daily. 03/11/21  Yes [provider]  ondansetron (ZOFRAN) 4 MG tablet Take 4 mg by mouth every 8 (eight) hours as needed. 09/13/21  Yes [provider]  spironolactone (ALDACTONE) 25 MG tablet Take 12.5 mg by mouth daily. 09/20/21  Yes [provider]  tamsulosin (FLOMAX) 0.4 MG CAPS capsule Take 0.8 mg by mouth in the morning and at bedtime.   Yes [provider]  torsemide (DEMADEX) 20 MG tablet Take 1 tablet (20 mg total) by mouth daily. And additional '20mg'$  if needed 09/30/21  Yes Christean Silvestri, Aura Fey, FNP  apixaban (ELIQUIS) 5 MG TABS tablet Take 1 tablet (5 mg total) by mouth 2 (two) times daily. 11/28/21   Alisa Graff, FNP    Review of Systems  Constitutional:  Positive for fatigue (easily). Negative for appetite change.  HENT:  Negative for congestion, postnasal drip and sore throat.   Eyes: Negative.   Respiratory:  Positive for cough, shortness of breath (easily) and wheezing. Negative for chest tightness.   Cardiovascular:  Negative for chest pain, palpitations and leg swelling.  Gastrointestinal:  Negative for abdominal distention and abdominal pain.  Endocrine: Negative.   Genitourinary: Negative.   Musculoskeletal:  Negative for back  pain and neck pain.  Skin: Negative.   Allergic/Immunologic: Negative.   Neurological:  Positive for weakness. Negative for dizziness and light-headedness.  Hematological:  Negative for adenopathy. Does not bruise/bleed easily.  Psychiatric/Behavioral:  Negative for dysphoric mood and sleep disturbance (sleeping on 2 pillows). The patient is not nervous/anxious.    Vitals:   11/28/21 0848  BP: 109/70  Pulse: 69  Resp: 18  SpO2: 98%  Weight: 181 lb 4 oz (82.2 kg)  Height: '5\' 5"'$  (1.651 m)   Wt Readings from Last 3 Encounters:  11/28/21 181 lb 4 oz (82.2 kg)  11/10/21 182 lb 6 oz (82.7 kg)  10/11/21 181 lb 2 oz (82.2 kg)   Lab Results  Component Value Date   CREATININE 1.87 (H) 11/10/2021   CREATININE 1.67 (H) 10/11/2021   CREATININE 1.24 10/07/2021   Physical Exam Vitals and nursing note reviewed. Exam conducted with a chaperone present (wife).  Constitutional:  Appearance: Normal appearance.  HENT:     Head: Normocephalic and atraumatic.  Cardiovascular:     Rate and Rhythm: Normal rate and regular rhythm.  Pulmonary:     Effort: Pulmonary effort is normal. No respiratory distress.     Breath sounds: No wheezing or rales.  Abdominal:     General: There is no distension.     Palpations: Abdomen is soft.     Tenderness: There is no abdominal tenderness.  Musculoskeletal:        General: No tenderness.     Cervical back: Normal range of motion and neck supple.     Right lower leg: No edema.     Left lower leg: No edema.  Skin:    General: Skin is warm and dry.  Neurological:     General: No focal deficit present.     Mental Status: He is alert and oriented to person, place, and time.  Psychiatric:        Mood and Affect: Mood normal.        Behavior: Behavior normal.        Thought Content: Thought content normal.    Assessment & Plan:  1: Chronic heart failure with reduced ejection fraction- - NYHA class III - euvolemic today - weighing daily; reminded  to call for an overnight weight gain of > 2 pounds or a weekly weight gain of >5 pounds - weight down 1.2 pounds from last visit here 2 weeks ago (did use furoscix '80mg'$  yesterday) - 1 sample provided today for him to have on hand again; they will call if they need to use it - will check labs at next visit if not done at PCP office - not adding "much" salt and has been reading food labels for sodium content; understands to keep daily sodium intake to '2000mg'$  / day - on GDMT of metoprolol and spironolactone; lisinopril still on hold - allergic to empagliflozin with a yeast infection - saw EP Quentin Ore) 09/21/21  - had AICD implanted 06/22/21; no shocks have been delivered - BNP 11/03/21 was 1457.3  2: HTN- - BP looks good (109/70) - saw PCP [name]) 11/10/21; returns in 2 weeks - BMP 11/10/21 reviewed and showed sodium 142, potassium 4.7, creatinine 1.87 & GFR 37  3: DM- - A1c 08/03/21 was 7.3% - glucose at home today was 170  4: Atrial fibrillation- - saw cardiology Corky Sox) 11/03/21 - currently on amiodarone, metoprolol and apixaban - filled out eliquis patient assistance forms today  5: CAD- - CABG with a LIMA to LAD, SVG to ramus, and SVG to PDA on 02/16/21 - currently participating in cardiac rehab   Medication list reviewed.   Keep already scheduled appointment in 6 weeks. Call back sooner if needed.

## 2021-11-30 ENCOUNTER — Encounter: Payer: Medicare Other | Attending: Internal Medicine

## 2021-11-30 DIAGNOSIS — Z951 Presence of aortocoronary bypass graft: Secondary | ICD-10-CM | POA: Diagnosis present

## 2021-11-30 NOTE — Progress Notes (Signed)
Daily Session Note  Patient Details  Name: Jermaine Kramer MRN: [id number] Date of Birth: 1950/05/21 Referring Provider:   Flowsheet Row Cardiac Rehab from 07/19/2021 in [facility]  Referring Provider Ernesta Amble MD       Encounter Date: 11/30/2021  Check In:  Session Check In - 11/30/21 0959       Check-In   Supervising physician immediately available to respond to emergencies See telemetry face sheet for immediately available ER MD    Location [name]    Staff Present Carson Myrtle, BS, RRT, CPFT;Rusty Glodowski, BS, Exercise Physiologist;Melissa Caiola, RDN, LDN;Susanne Bice, RN, BSN, CCRP    Virtual Visit No    Medication changes reported     No    Fall or balance concerns reported    No    Tobacco Cessation No Change    Warm-up and Cool-down Performed on first and last piece of equipment    Resistance Training Performed Yes    VAD Patient? No    PAD/SET Patient? No      Pain Assessment   Currently in Pain? No/denies    Multiple Pain Sites No                Social History   Tobacco Use  Smoking Status Never  Smokeless Tobacco Never    Goals Met:  Independence with exercise equipment Exercise tolerated well No report of concerns or symptoms today  Goals Unmet:  Not Applicable  Comments: Pt able to follow exercise prescription today without complaint.  Will continue to monitor for progression.    Dr. Emily Filbert is Medical Director for [facility].  Dr. Ottie Glazier is Medical Director for [facility] Pulmonary Rehabilitation.

## 2021-12-02 ENCOUNTER — Other Ambulatory Visit: Payer: Self-pay

## 2021-12-02 ENCOUNTER — Encounter: Payer: Self-pay | Admitting: Emergency Medicine

## 2021-12-02 ENCOUNTER — Emergency Department
Admission: EM | Admit: 2021-12-02 | Discharge: 2021-12-02 | Disposition: A | Discharge status: Home / Self Care | Payer: Medicare Other | Attending: Student in an Organized Health Care Education/Training Program | Admitting: Student in an Organized Health Care Education/Training Program

## 2021-12-02 ENCOUNTER — Emergency Department: Payer: Medicare Other

## 2021-12-02 DIAGNOSIS — Z951 Presence of aortocoronary bypass graft: Secondary | ICD-10-CM | POA: Insufficient documentation

## 2021-12-02 DIAGNOSIS — R1032 Left lower quadrant pain: Secondary | ICD-10-CM | POA: Diagnosis not present

## 2021-12-02 LAB — CBC
HCT: 38.7 % — ABNORMAL LOW (ref 39.0–52.0)
Hemoglobin: 12.6 g/dL — ABNORMAL LOW (ref 13.0–17.0)
MCH: 29.8 pg (ref 26.0–34.0)
MCHC: 32.6 g/dL (ref 30.0–36.0)
MCV: 91.5 fL (ref 80.0–100.0)
Platelets: 184 10*3/uL (ref 150–400)
RBC: 4.23 MIL/uL (ref 4.22–5.81)
RDW: 13.5 % (ref 11.5–15.5)
WBC: 5.6 10*3/uL (ref 4.0–10.5)
nRBC: 0 % (ref 0.0–0.2)

## 2021-12-02 LAB — URINALYSIS, ROUTINE W REFLEX MICROSCOPIC
Bilirubin Urine: NEGATIVE
Glucose, UA: 50 mg/dL — AB
Hgb urine dipstick: NEGATIVE
Ketones, ur: NEGATIVE mg/dL
Leukocytes,Ua: NEGATIVE
Nitrite: NEGATIVE
Protein, ur: NEGATIVE mg/dL
Specific Gravity, Urine: 1.016 (ref 1.005–1.030)
pH: 6 (ref 5.0–8.0)

## 2021-12-02 LAB — COMPREHENSIVE METABOLIC PANEL
ALT: 21 U/L (ref 0–44)
AST: 23 U/L (ref 15–41)
Albumin: 3.6 g/dL (ref 3.5–5.0)
Alkaline Phosphatase: 78 U/L (ref 38–126)
Anion gap: 10 (ref 5–15)
BUN: 31 mg/dL — ABNORMAL HIGH (ref 8–23)
CO2: 20 mmol/L — ABNORMAL LOW (ref 22–32)
Calcium: 8.8 mg/dL — ABNORMAL LOW (ref 8.9–10.3)
Chloride: 106 mmol/L (ref 98–111)
Creatinine, Ser: 1.88 mg/dL — ABNORMAL HIGH (ref 0.61–1.24)
GFR, Estimated: 37 mL/min — ABNORMAL LOW (ref >60)
Glucose, Bld: 231 mg/dL — ABNORMAL HIGH (ref 70–99)
Potassium: 4.6 mmol/L (ref 3.5–5.1)
Sodium: 136 mmol/L (ref 135–145)
Total Bilirubin: 0.9 mg/dL (ref 0.3–1.2)
Total Protein: 6.8 g/dL (ref 6.5–8.1)

## 2021-12-02 LAB — TROPONIN I (HIGH SENSITIVITY): Troponin I (High Sensitivity): 16 ng/L (ref <18)

## 2021-12-02 LAB — LIPASE, BLOOD: Lipase: 39 U/L (ref 11–51)

## 2021-12-02 MED ORDER — ONDANSETRON HCL 4 MG/2ML IJ SOLN
4.0000 mg | Freq: Once | INTRAMUSCULAR | Status: AC
  Administered 2021-12-02: 4 mg via INTRAVENOUS
  Filled 2021-12-02: qty 2

## 2021-12-02 MED ORDER — IOHEXOL 300 MG/ML  SOLN
80.0000 mL | Freq: Once | INTRAMUSCULAR | Status: AC | PRN
  Administered 2021-12-02: 80 mL via INTRAVENOUS

## 2021-12-02 MED ORDER — MORPHINE SULFATE (PF) 4 MG/ML IV SOLN
4.0000 mg | INTRAVENOUS | Status: DC | PRN
  Administered 2021-12-02: 4 mg via INTRAVENOUS
  Filled 2021-12-02: qty 1

## 2021-12-02 MED ORDER — LACTULOSE 10 GM/15ML PO SOLN: 20.0000 g | Freq: Two times a day (BID) | ORAL | 0 refills | Status: DC | PRN

## 2021-12-02 NOTE — ED Triage Notes (Signed)
Patient to ED via POV for left sided abdominal pain x1 week. Pain worsening today. Denies N/V/D.

## 2021-12-02 NOTE — ED Provider Notes (Signed)
[facility] Provider Note    Event Date/Time   First MD Initiated Contact with Patient 12/02/21 [phone]     (approximate)   History   Abdominal Pain   HPI  Jermaine Kramer is a 71 y.o. male presents with past medical history including CABG and ileus presents to the ER for evaluation of left-sided flank and lower quadrant abdominal pain.  No symptoms associated nausea no vomiting no diarrhea.  He is still passing gas.  States the pain is moderate to severe.  Denies any chest pain or shortness of breath.  No swelling.  Also has a history of kidney stones.  Feels different states     Physical Exam   Triage Vital Signs: ED Triage Vitals [12/02/21 0901]  Enc Vitals Group     BP 103/74     Pulse Rate 72     Resp 18     Temp 98.6 F (37 C)     Temp Source Oral     SpO2 97 %     Weight      Height      Head Circumference      Peak Flow      Pain Score 10     Pain Loc      Pain Edu?      Excl. in [place]?     Most recent vital signs: Vitals:   12/02/21 1030 12/02/21 1045  BP: 102/65   Pulse: 69 69  Resp:  10  Temp:    SpO2: 97% 98%     Constitutional: Alert  Eyes: Conjunctivae are normal.  Head: Atraumatic. Nose: No congestion/rhinnorhea. Mouth/Throat: Mucous membranes are moist.   Neck: Painless ROM.  Cardiovascular:   Good peripheral circulation. Respiratory: Normal respiratory effort.  No retractions.  Gastrointestinal: Soft with mild ttp in llq, no guarding or rebound Musculoskeletal:  no deformity Neurologic:  MAE spontaneously. No gross focal neurologic deficits are appreciated.  Skin:  Skin is warm, dry and intact. No rash noted. Psychiatric: Mood and affect are normal. Speech and behavior are normal.    ED Results / Procedures / Treatments   Labs (all labs ordered are listed, but only abnormal results are displayed) Labs Reviewed  COMPREHENSIVE METABOLIC PANEL - Abnormal; Notable for the following components:      Result Value    CO2 20 (*)    Glucose, Bld 231 (*)    BUN 31 (*)    Creatinine, Ser 1.88 (*)    Calcium 8.8 (*)    GFR, Estimated 37 (*)    All other components within normal limits  CBC - Abnormal; Notable for the following components:   Hemoglobin 12.6 (*)    HCT 38.7 (*)    All other components within normal limits  URINALYSIS, ROUTINE W REFLEX MICROSCOPIC - Abnormal; Notable for the following components:   Color, Urine YELLOW (*)    APPearance CLEAR (*)    Glucose, UA 50 (*)    All other components within normal limits  LIPASE, BLOOD  TROPONIN I (HIGH SENSITIVITY)     EKG     RADIOLOGY Please see ED Course for my review and interpretation.  I personally reviewed all radiographic images ordered to evaluate for the above acute complaints and reviewed radiology reports and findings.  These findings were personally discussed with the patient.  Please see medical record for radiology report.    PROCEDURES:  Critical Care performed: No  Procedures   MEDICATIONS ORDERED IN  ED: Medications  morphine (PF) 4 MG/ML injection 4 mg (4 mg Intravenous Given 12/02/21 0959)  ondansetron (ZOFRAN) injection 4 mg (4 mg Intravenous Given 12/02/21 0949)  iohexol (OMNIPAQUE) 300 MG/ML solution 80 mL (80 mLs Intravenous Contrast Given 12/02/21 1004)     IMPRESSION / MDM / ASSESSMENT AND PLAN / ED COURSE  I reviewed the triage vital signs and the nursing notes.                              Differential diagnosis includes, but is not limited to, SBO, ileus, diverticulitis, stone, colitis, AAA, musculoskeletal strain  Patient presented to the ER for evaluation of symptoms as described above.  This presenting complaint could reflect a potentially life-threatening illness therefore the patient will be placed on continuous pulse oximetry and telemetry for monitoring.  Laboratory evaluation will be sent to evaluate for the above complaints.    Clinical Course as of 12/02/21 1109  Fri Dec 02, 2021  1022  CT imaging on my review and interpretation without any evidence of obstruction or ileus.  Will await formal radiology report [PR]  1108 Reassessed.  He feels well.  Denies any discomfort.  Pain is somewhat reproducible with standing. No sign of UTI.  Given reassuring work-up.  He is well-appearing today with stable and appropriate for outpatient follow-up.  We discussed strict return options.  Patient agreeable plan. [PR]    Clinical Course User Index [PR] Merlyn Lot, MD    FINAL CLINICAL IMPRESSION(S) / ED DIAGNOSES   Final diagnoses:  Left lower quadrant abdominal pain     Rx / DC Orders   ED Discharge Orders          Ordered    lactulose (CHRONULAC) 10 GM/15ML solution  2 times daily PRN        12/02/21 1108             Note:  This document was prepared using Dragon voice recognition software and may include unintentional dictation errors.    Merlyn Lot, MD 12/02/21 1109

## 2021-12-06 ENCOUNTER — Emergency Department
Admission: EM | Admit: 2021-12-06 | Discharge: 2021-12-06 | Disposition: A | Discharge status: Home / Self Care | Payer: Medicare Other | Attending: Emergency Medicine | Admitting: Emergency Medicine

## 2021-12-06 ENCOUNTER — Other Ambulatory Visit: Payer: Self-pay

## 2021-12-06 ENCOUNTER — Emergency Department: Payer: Medicare Other

## 2021-12-06 ENCOUNTER — Ambulatory Visit: Payer: Medicare Other | Admitting: Urology

## 2021-12-06 ENCOUNTER — Encounter: Payer: Self-pay | Admitting: Emergency Medicine

## 2021-12-06 DIAGNOSIS — E860 Dehydration: Secondary | ICD-10-CM

## 2021-12-06 DIAGNOSIS — R1032 Left lower quadrant pain: Secondary | ICD-10-CM

## 2021-12-06 DIAGNOSIS — K5900 Constipation, unspecified: Secondary | ICD-10-CM | POA: Diagnosis not present

## 2021-12-06 LAB — URINALYSIS, ROUTINE W REFLEX MICROSCOPIC
Bilirubin Urine: NEGATIVE
Glucose, UA: NEGATIVE mg/dL
Hgb urine dipstick: NEGATIVE
Ketones, ur: NEGATIVE mg/dL
Leukocytes,Ua: NEGATIVE
Nitrite: NEGATIVE
Protein, ur: NEGATIVE mg/dL
Specific Gravity, Urine: 1.01 (ref 1.005–1.030)
pH: 5.5 (ref 5.0–8.0)

## 2021-12-06 LAB — BASIC METABOLIC PANEL
Anion gap: 7 (ref 5–15)
BUN: 39 mg/dL — ABNORMAL HIGH (ref 8–23)
CO2: 22 mmol/L (ref 22–32)
Calcium: 9.1 mg/dL (ref 8.9–10.3)
Chloride: 109 mmol/L (ref 98–111)
Creatinine, Ser: 2.1 mg/dL — ABNORMAL HIGH (ref 0.61–1.24)
GFR, Estimated: 33 mL/min — ABNORMAL LOW (ref >60)
Glucose, Bld: 186 mg/dL — ABNORMAL HIGH (ref 70–99)
Potassium: 4.8 mmol/L (ref 3.5–5.1)
Sodium: 138 mmol/L (ref 135–145)

## 2021-12-06 LAB — CBC
HCT: 39.9 % (ref 39.0–52.0)
Hemoglobin: 12.9 g/dL — ABNORMAL LOW (ref 13.0–17.0)
MCH: 29.3 pg (ref 26.0–34.0)
MCHC: 32.3 g/dL (ref 30.0–36.0)
MCV: 90.5 fL (ref 80.0–100.0)
Platelets: 193 10*3/uL (ref 150–400)
RBC: 4.41 MIL/uL (ref 4.22–5.81)
RDW: 13.6 % (ref 11.5–15.5)
WBC: 5.7 10*3/uL (ref 4.0–10.5)
nRBC: 0 % (ref 0.0–0.2)

## 2021-12-06 LAB — LIPASE, BLOOD: Lipase: 40 U/L (ref 11–51)

## 2021-12-06 MED ORDER — MORPHINE SULFATE (PF) 4 MG/ML IV SOLN
4.0000 mg | Freq: Once | INTRAVENOUS | Status: AC
  Administered 2021-12-06: 4 mg via INTRAVENOUS
  Filled 2021-12-06: qty 1

## 2021-12-06 MED ORDER — BISACODYL 5 MG PO TBEC: 5.0000 mg | DELAYED_RELEASE_TABLET | Freq: Every day | ORAL | 1 refills | Status: DC | PRN

## 2021-12-06 MED ORDER — SODIUM CHLORIDE 0.9 % IV BOLUS
1000.0000 mL | Freq: Once | INTRAVENOUS | Status: AC
  Administered 2021-12-06: 1000 mL via INTRAVENOUS

## 2021-12-06 NOTE — Discharge Instructions (Addendum)
Please use MiraLAX one half capful every hour until your first bowel movement.  Please do not take any MiraLAX after this for at least 24 hours.  You may use one half capful twice a day of MiraLAX in order to have 1 solid well-formed bowel movement per day.  You may increase or decrease this dosage as needed to obtain this 1 well-formed bowel movement.  Please make sure that you are drinking at least 8 ounces of water every hour during this initial bowel regimen. Please use 1 Dulcolax as well

## 2021-12-06 NOTE — ED Triage Notes (Signed)
First Nurse: Pt here via [facility] from home with abd pain. Pt was at a medical facility on Friday with same, dx with kidney stones, hx of same. Hx of bypass surgery as well. 50 mcg of fentanyl given by ems with no relief. Pain is in lower abd.    94% RA 220-cbg

## 2021-12-06 NOTE — ED Notes (Signed)
Pr provides verbal consent for dc

## 2021-12-06 NOTE — ED Triage Notes (Signed)
Patient to ED via [facility] for left sided abd pain. Patient has known kidney stones but has pain medication at home but unable to get pain under control.

## 2021-12-06 NOTE — ED Provider Notes (Signed)
[facility] Provider Note   Event Date/Time   First MD Initiated Contact with Patient 12/06/21 1248     (approximate) History  Abdominal Pain  HPI EMILY FORSE is a 71 y.o. male with a history of drug-induced constipation who presents for left lower quadrant abdominal pain.  Patient describes the pain as 10/10 in severity and radiating to the middle of his abdomen.  Patient states that he has been having watery bowel movements after he has tried to take lactulose over the past few days.  Patient denies any recent travel, sick contacts, or food out of the ordinary.  Patient denies any abnormal activity recently. ROS: Patient currently denies any vision changes, tinnitus, difficulty speaking, facial droop, sore throat, chest pain, shortness of breath, nausea/vomiting/diarrhea, dysuria, or weakness/numbness/paresthesias in any extremity   Physical Exam  Triage Vital Signs: ED Triage Vitals [12/06/21 1129]  Enc Vitals Group     BP 101/70     Pulse Rate 74     Resp 18     Temp 98.4 F (36.9 C)     Temp Source Oral     SpO2 92 %     Weight      Height      Head Circumference      Peak Flow      Pain Score 10     Pain Loc      Pain Edu?      Excl. in [place]?    Most recent vital signs: Vitals:   12/06/21 1630 12/06/21 1645  BP: 102/71   Pulse: 70   Resp:  17  Temp:    SpO2:  96%   General: Awake, oriented x4. CV:  Good peripheral perfusion.  Resp:  Normal effort.  Abd:  No distention.  Mild left lower quadrant tenderness to palpation Other:  Obese elderly Caucasian male laying in bed in no acute distress ED Results / Procedures / Treatments  Labs (all labs ordered are listed, but only abnormal results are displayed) Labs Reviewed  BASIC METABOLIC PANEL - Abnormal; Notable for the following components:      Result Value   Glucose, Bld 186 (*)    BUN 39 (*)    Creatinine, Ser 2.10 (*)    GFR, Estimated 33 (*)    All other components within normal  limits  CBC - Abnormal; Notable for the following components:   Hemoglobin 12.9 (*)    All other components within normal limits  URINALYSIS, ROUTINE W REFLEX MICROSCOPIC  LIPASE, BLOOD  RADIOLOGY ED MD interpretation: CT of the abdomen and pelvis without IV contrast shows a moderate amount of stool throughout the colon as well as nonobstructing left renal calculi with no evidence of ureteral calculus or hydronephrosis.  There is interval enlargement of the right pleural effusion with worsening right basilar airspace disease however the left pleural effusion has improved -Agree with radiology assessment Official radiology report(s): CT Renal Stone Study  Result Date: 12/06/2021 CLINICAL DATA:  Nephrolithiasis.  Abdominal pain. EXAM: CT ABDOMEN AND PELVIS WITHOUT CONTRAST TECHNIQUE: Multidetector CT imaging of the abdomen and pelvis was performed following the standard protocol without IV contrast. RADIATION DOSE REDUCTION: This exam was performed according to the departmental dose-optimization program which includes automated exposure control, adjustment of the mA and/or kV according to patient size and/or use of iterative reconstruction technique. COMPARISON:  CT 12/02/2021 and 10/03/2021. FINDINGS: Lower chest: Interval enlargement of a dependent right pleural effusion with increasing right basilar  pulmonary opacity, likely atelectasis. Left pleural effusion has nearly completely resolved. Stable cardiomegaly post median sternotomy and AICD placement. No significant pericardial fluid. Hepatobiliary: The liver appears unremarkable as imaged in the noncontrast state. No evidence of significant biliary dilatation post cholecystectomy. Pancreas: Unremarkable. No pancreatic ductal dilatation or surrounding inflammatory changes. Spleen: Normal in size without focal abnormality. Adrenals/Urinary Tract: Stable linear calcifications medial to the right adrenal gland, likely related to previous hemorrhage. No  adrenal mass. Stable small nonobstructing left renal calculi. No evidence of ureteral calculus or hydronephrosis. Both kidneys demonstrate chronic cortical thinning. There are unchanged bilateral renal cysts for which no specific imaging follow-up is recommended. The bladder appears unremarkable for its degree of distention. Stomach/Bowel: No enteric contrast administered. Small hiatal hernia. The stomach otherwise appears unremarkable for its degree of distention. No evidence of bowel distension, wall thickening or surrounding inflammation. The appendix appears normal. Mild distal colonic diverticulosis. Vascular/Lymphatic: There are no enlarged abdominal or pelvic lymph nodes. Aortic and branch vessel atherosclerosis without evidence of aneurysm. Reproductive: Moderate enlargement of the prostate gland. Other: Small umbilical hernia containing only fat. No ascites or free air. Musculoskeletal: No acute or significant osseous findings. Mild spondylosis. IMPRESSION: 1. Stable abdominopelvic CT without acute findings. Nonobstructing left renal calculi. No evidence of ureteral calculus or hydronephrosis. 2. Interval enlargement of right pleural effusion with worsening right basilar airspace disease. Left pleural effusion has improved. 3. Stable incidental abdominal findings including distal colonic diverticulosis, bilateral renal cortical thinning, prostatomegaly and Aortic Atherosclerosis (ICD10-I70.0). Electronically Signed   By: Richardean Sale M.D.   On: 12/06/2021 14:35   PROCEDURES: Critical Care performed: No Procedures MEDICATIONS ORDERED IN ED: Medications  sodium chloride 0.9 % bolus 1,000 mL (0 mLs Intravenous Stopped 12/06/21 1652)  morphine (PF) 4 MG/ML injection 4 mg (4 mg Intravenous Given 12/06/21 1356)   IMPRESSION / MDM / ASSESSMENT AND PLAN / ED COURSE  I reviewed the triage vital signs and the nursing notes.                             The patient is on the cardiac monitor to evaluate for  evidence of arrhythmia and/or significant heart rate changes. Patient's presentation is most consistent with acute presentation with potential threat to life or bodily function. Patients history and exam most consistent with constipation as an etiology for their pain.  Patients symptoms not typical for other emergent causes of abdominal pain such as, but not limited to, appendicitis, abdominal aortic aneurysm, pancreatitis, SBO, mesenteric ischemia, serious intra-abdominal bacterial illness.  Patient without red flags concerning for cancer as a constipation etiology.  Rx: Miralax  Disposition:  Patient will be discharged with strict return precautions and follow up with primary MD within 24-48 hours for further evaluation. Patient understands that this still may have an early presentation of an emergent medical condition such as appendicitis that will require a recheck.   FINAL CLINICAL IMPRESSION(S) / ED DIAGNOSES   Final diagnoses:  Left lower quadrant abdominal pain  Constipation, unspecified constipation type  Dehydration   Rx / DC Orders   ED Discharge Orders          Ordered    bisacodyl (DULCOLAX) 5 MG EC tablet  Daily PRN        12/06/21 1630           Note:  This document was prepared using Dragon voice recognition software and may include unintentional dictation errors.  Naaman Plummer, MD 12/06/21 1910

## 2021-12-07 ENCOUNTER — Encounter: Payer: Self-pay | Admitting: *Deleted

## 2021-12-07 DIAGNOSIS — Z951 Presence of aortocoronary bypass graft: Secondary | ICD-10-CM

## 2021-12-07 NOTE — Progress Notes (Signed)
Cardiac Individual Treatment Plan  Patient Details  Name: HENRIQUE PAREKH MRN: [id number] Date of Birth: Nov 04, 1950 Referring Provider:   Flowsheet Row Cardiac Rehab from 07/19/2021 in [facility] Cardiac and Pulmonary Rehab  Referring Provider Ernesta Amble MD       Initial Encounter Date:  Flowsheet Row Cardiac Rehab from 07/19/2021 in [facility] Cardiac and Pulmonary Rehab  Date 07/19/21       Visit Diagnosis: S/P CABG x 3  Patient's Home Medications on Admission:  Current Outpatient Medications:    amiodarone (PACERONE) 200 MG tablet, Take 200 mg by mouth daily., Disp: , Rfl:    apixaban (ELIQUIS) 5 MG TABS tablet, Take 1 tablet (5 mg total) by mouth 2 (two) times daily., Disp: 180 tablet, Rfl: 3   aspirin EC 81 MG tablet, Take 81 mg by mouth daily., Disp: , Rfl:    atorvastatin (LIPITOR) 80 MG tablet, Take 80 mg by mouth daily., Disp: , Rfl:    bisacodyl (DULCOLAX) 5 MG EC tablet, Take 1 tablet (5 mg total) by mouth daily as needed for moderate constipation., Disp: 30 tablet, Rfl: 1   glucose blood (PRECISION QID TEST) test strip, Use 3 (three) times daily Use as instructed. One touch ultra, Disp: , Rfl:    lactulose (CHRONULAC) 10 GM/15ML solution, Take 30 mLs (20 g total) by mouth 2 (two) times daily as needed for mild constipation., Disp: 236 mL, Rfl: 0   metFORMIN (GLUCOPHAGE) 1000 MG tablet, Take 500 mg by mouth 2 (two) times daily with a meal., Disp: , Rfl:    metoprolol succinate (TOPROL-XL) 25 MG 24 hr tablet, Take 0.5 tablets (12.5 mg total) by mouth every evening., Disp: 45 tablet, Rfl: 3   omeprazole (PRILOSEC) 40 MG capsule, Take 40 mg by mouth 2 (two) times daily., Disp: , Rfl:    ondansetron (ZOFRAN) 4 MG tablet, Take 4 mg by mouth every 8 (eight) hours as needed., Disp: , Rfl:    spironolactone (ALDACTONE) 25 MG tablet, Take 12.5 mg by mouth daily., Disp: , Rfl:    tamsulosin (FLOMAX) 0.4 MG CAPS capsule, Take 0.8 mg by mouth in the morning and at bedtime., Disp: , Rfl:     torsemide (DEMADEX) 20 MG tablet, Take 1 tablet (20 mg total) by mouth daily. And additional 42m if needed, Disp: 40 tablet, Rfl: 3  Past Medical History: Past Medical History:  Diagnosis Date   Arrhythmia    atrial fibrillation   CHF (congestive heart failure) (HCC)    Coronary artery disease    Diabetes mellitus without complication (HCC)    GERD (gastroesophageal reflux disease)    Hypercholesteremia    Hypertension    Sleep apnea    Stroke ([place]     Tobacco Use: Social History   Tobacco Use  Smoking Status Never  Smokeless Tobacco Never    Labs: Review Flowsheet       Latest Ref Rng & Units 04/21/2021 05/16/2021  Labs for ITP Cardiac and Pulmonary Rehab  Hemoglobin A1c 4.8 - 5.6 % - 7.8   Bicarbonate 20.0 - 28.0 mmol/L 23.2  -  Acid-base deficit 0.0 - 2.0 mmol/L 0.6  -  O2 Saturation % 97.4  -     Exercise Target Goals: Exercise Program Goal: Individual exercise prescription set using results from initial 6 min walk test and THRR while considering  patient's activity barriers and safety.   Exercise Prescription Goal: Initial exercise prescription builds to 30-45 minutes a day of aerobic activity, 2-3 days per  week.  Home exercise guidelines will be given to patient during program as part of exercise prescription that the participant will acknowledge.   Education: Aerobic Exercise: - Group verbal and visual presentation on the components of exercise prescription. Introduces F.I.T.T principle from ACSM for exercise prescriptions.  Reviews F.I.T.T. principles of aerobic exercise including progression. Written material given at graduation. Flowsheet Row Cardiac Rehab from 07/19/2021 in [facility] Cardiac and Pulmonary Rehab  Education need identified 07/19/21       Education: Resistance Exercise: - Group verbal and visual presentation on the components of exercise prescription. Introduces F.I.T.T principle from ACSM for exercise prescriptions  Reviews F.I.T.T.  principles of resistance exercise including progression. Written material given at graduation.    Education: Exercise & Equipment Safety: - Individual verbal instruction and demonstration of equipment use and safety with use of the equipment. Flowsheet Row Cardiac Rehab from 07/11/2021 in [facility] Cardiac and Pulmonary Rehab  Date 07/11/21  Educator [facility]  Instruction Review Code 1- Verbalizes Understanding       Education: Exercise Physiology & General Exercise Guidelines: - Group verbal and written instruction with models to review the exercise physiology of the cardiovascular system and associated critical values. Provides general exercise guidelines with specific guidelines to those with heart or lung disease.  Flowsheet Row Cardiac Rehab from 07/19/2021 in [facility] Cardiac and Pulmonary Rehab  Education need identified 07/19/21       Education: Flexibility, Balance, Mind/Body Relaxation: - Group verbal and visual presentation with interactive activity on the components of exercise prescription. Introduces F.I.T.T principle from ACSM for exercise prescriptions. Reviews F.I.T.T. principles of flexibility and balance exercise training including progression. Also discusses the mind body connection.  Reviews various relaxation techniques to help reduce and manage stress (i.e. Deep breathing, progressive muscle relaxation, and visualization). Balance handout provided to take home. Written material given at graduation.   Activity Barriers & Risk Stratification:  Activity Barriers & Cardiac Risk Stratification - 07/19/21 1442       Activity Barriers & Cardiac Risk Stratification   Activity Barriers Decreased Ventricular Function;Other (comment);Deconditioning    Comments Per patient, weight restrictions until 4/6- recent ICD placement    Cardiac Risk Stratification High             6 Minute Walk:  6 Minute Walk     Row Name 07/19/21 1458         6 Minute Walk   Phase Initial      Distance 1225 feet     Walk Time 6 minutes     # of Rest Breaks 0     MPH 2.32     METS 2.76     RPE 11     Perceived Dyspnea  1     VO2 Peak 9.66     Symptoms Yes (comment)     Comments SOB, legs fatigued     Resting HR 75 bpm     Resting BP 104/64     Resting Oxygen Saturation  97 %     Exercise Oxygen Saturation  during 6 min walk 95 %     Max Ex. HR 111 bpm     Max Ex. BP 140/68     2 Minute Post BP 108/68              Oxygen Initial Assessment:   Oxygen Re-Evaluation:   Oxygen Discharge (Final Oxygen Re-Evaluation):   Initial Exercise Prescription:  Initial Exercise Prescription - 07/19/21 1500  Date of Initial Exercise RX and Referring Provider   Date 07/19/21    Referring Provider Ernesta Amble MD      Oxygen   Maintain Oxygen Saturation 88% or higher      Treadmill   MPH 2.1    Grade 0.5    Minutes 15    METs 2.75      Recumbant Bike   Level 2    RPM 60    Watts 20    Minutes 15    METs 2.7      REL-XR   Level 1    Speed 50    Minutes 15    METs 2.7      Prescription Details   Frequency (times per week) 2    Duration Progress to 30 minutes of continuous aerobic without signs/symptoms of physical distress      Intensity   THRR 40-80% of Max Heartrate 104-133    Ratings of Perceived Exertion 11-13    Perceived Dyspnea 0-4      Progression   Progression Continue to progress workloads to maintain intensity without signs/symptoms of physical distress.      Resistance Training   Training Prescription Yes    Weight 3 lb   ROM until 4//6: Recent ICD placement   Reps 10-15             Perform Capillary Blood Glucose checks as needed.  Exercise Prescription Changes:   Exercise Prescription Changes     Row Name 07/19/21 1500 08/09/21 1600 08/22/21 0900 08/29/21 1000 09/05/21 1500     Response to Exercise   Blood Pressure (Admit) 104/64 118/60 136/64 -- 110/62   Blood Pressure (Exercise) 140/68 150/72 134/74 -- 122/64    Blood Pressure (Exit) 108/68 102/54 104/64 -- 102/58   Heart Rate (Admit) 75 bpm 93 bpm 94 bpm -- 82 bpm   Heart Rate (Exercise) 111 bpm 118 bpm 114 bpm -- 119 bpm   Heart Rate (Exit) 79 bpm 101 bpm 94 bpm -- 89 bpm   Oxygen Saturation (Admit) 97 % -- -- -- --   Oxygen Saturation (Exercise) 95 % -- -- -- --   Rating of Perceived Exertion (Exercise) '11 15 13 ' -- 13   Perceived Dyspnea (Exercise) 1 -- -- -- --   Symptoms SOB, legs fatigued none none -- none   Comments walk test results 2nd full day of exercise -- -- --   Duration -- Progress to 30 minutes of  aerobic without signs/symptoms of physical distress Continue with 30 min of aerobic exercise without signs/symptoms of physical distress. -- Continue with 30 min of aerobic exercise without signs/symptoms of physical distress.   Intensity -- THRR unchanged THRR unchanged -- THRR unchanged     Progression   Progression -- Continue to progress workloads to maintain intensity without signs/symptoms of physical distress. Continue to progress workloads to maintain intensity without signs/symptoms of physical distress. -- Continue to progress workloads to maintain intensity without signs/symptoms of physical distress.   Average METs -- 2.6 3.01 -- 3.81     Resistance Training   Training Prescription -- Yes Yes -- Yes   Weight -- 3 lb 3 lb -- 3 lb   Reps -- 10-15 10-15 -- 10-15     Interval Training   Interval Training -- No No -- No     Treadmill   MPH -- 2.1 2.1 -- 2.3   Grade -- 0.5 0.5 -- 0.5   Minutes -- 15 15 --  15   METs -- 2.75 2.75 -- 2.92     Recumbant Bike   Level -- 2 3 -- 2   Watts -- 36 27 -- --   Minutes -- 15 15 -- 15   METs -- -- 2.29 -- --     NuStep   Level -- -- -- -- 4   Minutes -- -- -- -- 15   METs -- -- -- -- 4     REL-XR   Level -- 2 3 -- 3   Minutes -- 15 15 -- 15   METs -- 5.1 4.4 -- 4.5     Home Exercise Plan   Plans to continue exercise at -- -- -- Home (comment)  walking, staff vidoes Home  (comment)  walking, staff vidoes   Frequency -- -- -- Add 2 additional days to program exercise sessions. Add 2 additional days to program exercise sessions.   Initial Home Exercises Provided -- -- -- 08/29/21 08/29/21     Oxygen   Maintain Oxygen Saturation -- 88% or higher 88% or higher -- 88% or higher    Row Name 09/22/21 1400 10/18/21 1600 11/14/21 1400 11/30/21 0700       Response to Exercise   Blood Pressure (Admit) 110/62 96/60 126/64 118/60    Blood Pressure (Exercise) 130/68 -- -- --    Blood Pressure (Exit) 108/64 94/56 122/64 130/70    Heart Rate (Admit) 87 bpm 81 bpm 69 bpm 70 bpm    Heart Rate (Exercise) 118 bpm 101 bpm 119 bpm 102 bpm    Heart Rate (Exit) 92 bpm 81 bpm 76 bpm 70 bpm    Rating of Perceived Exertion (Exercise) '13 13 13 13    ' Symptoms none significant weight loss due to roto virus none none    Comments -- return back from hospital -- --    Duration Continue with 30 min of aerobic exercise without signs/symptoms of physical distress. Continue with 30 min of aerobic exercise without signs/symptoms of physical distress. Continue with 30 min of aerobic exercise without signs/symptoms of physical distress. Continue with 30 min of aerobic exercise without signs/symptoms of physical distress.    Intensity THRR unchanged THRR unchanged THRR unchanged THRR unchanged      Progression   Progression Continue to progress workloads to maintain intensity without signs/symptoms of physical distress. Continue to progress workloads to maintain intensity without signs/symptoms of physical distress. Continue to progress workloads to maintain intensity without signs/symptoms of physical distress. Continue to progress workloads to maintain intensity without signs/symptoms of physical distress.    Average METs 3.41 4.8 3.53 3.56      Resistance Training   Training Prescription Yes Yes Yes Yes    Weight 3 lb 3 lb 3 lb 3 lb    Reps 10-15 10-15 10-15 10-15      Interval Training    Interval Training No No No No      Treadmill   MPH 2.4 -- 2.5 2.3    Grade 1 -- 2.5 1.5    Minutes 15 -- 15 15    METs 3.17 -- 3.78 3.23      Recumbant Bike   Level 2 -- 5 3    Watts 25 -- -- --    Minutes 15 -- 15 15    METs 2.72 -- 2.75 2.75      NuStep   Level 4 -- 5 1    Minutes 15 -- 15 15    METs  3.7 -- 3 3.4      REL-XR   Level '3 1 2 2    ' Minutes '15 15 15 15    ' METs 4.9 4.7 4.8 4.7      Home Exercise Plan   Plans to continue exercise at Home (comment)  walking, staff vidoes Home (comment)  walking, staff vidoes Home (comment)  walking, staff vidoes Home (comment)  walking, staff vidoes    Frequency Add 2 additional days to program exercise sessions. Add 2 additional days to program exercise sessions. Add 2 additional days to program exercise sessions. Add 2 additional days to program exercise sessions.    Initial Home Exercises Provided 08/29/21 08/29/21 08/29/21 08/29/21      Oxygen   Maintain Oxygen Saturation 88% or higher 88% or higher 88% or higher 88% or higher             Exercise Comments:   Exercise Goals and Review:   Exercise Goals     Row Name 07/19/21 1530             Exercise Goals   Increase Physical Activity Yes       Intervention Provide advice, education, support and counseling about physical activity/exercise needs.;Develop an individualized exercise prescription for aerobic and resistive training based on initial evaluation findings, risk stratification, comorbidities and participant's personal goals.       Expected Outcomes Short Term: Attend rehab on a regular basis to increase amount of physical activity.;Long Term: Add in home exercise to make exercise part of routine and to increase amount of physical activity.;Long Term: Exercising regularly at least 3-5 days a week.       Increase Strength and Stamina Yes       Intervention Provide advice, education, support and counseling about physical activity/exercise needs.;Develop an  individualized exercise prescription for aerobic and resistive training based on initial evaluation findings, risk stratification, comorbidities and participant's personal goals.       Expected Outcomes Short Term: Perform resistance training exercises routinely during rehab and add in resistance training at home;Long Term: Improve cardiorespiratory fitness, muscular endurance and strength as measured by increased METs and functional capacity (6MWT);Short Term: Increase workloads from initial exercise prescription for resistance, speed, and METs.       Able to understand and use rate of perceived exertion (RPE) scale Yes       Intervention Provide education and explanation on how to use RPE scale       Expected Outcomes Short Term: Able to use RPE daily in rehab to express subjective intensity level;Long Term:  Able to use RPE to guide intensity level when exercising independently       Able to understand and use Dyspnea scale Yes       Intervention Provide education and explanation on how to use Dyspnea scale       Expected Outcomes Short Term: Able to use Dyspnea scale daily in rehab to express subjective sense of shortness of breath during exertion;Long Term: Able to use Dyspnea scale to guide intensity level when exercising independently       Knowledge and understanding of Target Heart Rate Range (THRR) Yes       Intervention Provide education and explanation of THRR including how the numbers were predicted and where they are located for reference       Expected Outcomes Short Term: Able to state/look up THRR;Long Term: Able to use THRR to govern intensity when exercising independently;Short Term: Able to use daily as  guideline for intensity in rehab       Able to check pulse independently Yes       Intervention Provide education and demonstration on how to check pulse in carotid and radial arteries.;Review the importance of being able to check your own pulse for safety during independent exercise        Expected Outcomes Short Term: Able to explain why pulse checking is important during independent exercise;Long Term: Able to check pulse independently and accurately       Understanding of Exercise Prescription Yes       Intervention Provide education, explanation, and written materials on patient's individual exercise prescription       Expected Outcomes Short Term: Able to explain program exercise prescription;Long Term: Able to explain home exercise prescription to exercise independently                Exercise Goals Re-Evaluation :  Exercise Goals Re-Evaluation     Row Name 08/01/21 1046 08/09/21 1607 08/15/21 1012 08/22/21 0933 08/29/21 1016     Exercise Goal Re-Evaluation   Exercise Goals Review Increase Physical Activity;Able to understand and use rate of perceived exertion (RPE) scale;Knowledge and understanding of Target Heart Rate Range (THRR);Understanding of Exercise Prescription;Increase Strength and Stamina;Able to check pulse independently Increase Physical Activity;Increase Strength and Stamina Increase Physical Activity;Increase Strength and Stamina Increase Physical Activity;Increase Strength and Stamina Understanding of Exercise Prescription;Able to check pulse independently;Knowledge and understanding of Target Heart Rate Range (THRR);Able to understand and use Dyspnea scale;Able to understand and use rate of perceived exertion (RPE) scale;Increase Physical Activity;Increase Strength and Stamina   Comments Reviewed RPE and dyspnea scales, THR and program prescription with pt today.  Pt voiced understanding and was given a copy of goals to take home. Slayton is doing well for the first couple of sessions that he has been here. He is tolerating his exercise prescription well and is already up to level 2 on the XR. We will continue to monitor as he progresses throughout the program. Jeremaih states that he does feel like his exercise is going well and he is increasing workloads and  feels stronger. His SOB is about the same and is his main limiting factor. Ulice Dash continues to do well in rehab. He has increased to level 3 on both the XR and RB.  He would benefit by increasing his load on the treadmill, if tolerated. Will continue to monitor. Reviewed home exercise with pt today.  Pt plans to walk and use staff videos at home for exercise.  Reviewed THR, pulse, RPE, sign and symptoms, pulse oximetery and when to call 911 or MD.  Also discussed weather considerations and indoor options.  Pt voiced understanding.   Expected Outcomes Short: Use RPE daily to regulate intensity. Long: Follow program prescription in THR. Short: Increase tolerance and loads on treadmill Long: Continue to increase overall MET level Short: continiue to attend cardiac rehab regularly and walk at home. Long: increase overall MET levels and become independent with exercise routine. Short: Increase loads on the treadmill as tolerated Long: Continue to build up overall strength and stamina Short: Start to add in more walking at home Long: continue to exercise independently    [place] Name 09/05/21 1557 09/22/21 1406 10/03/21 1549 10/18/21 1636 10/31/21 1020     Exercise Goal Re-Evaluation   Exercise Goals Review Increase Physical Activity;Increase Strength and Stamina;Understanding of Exercise Prescription Increase Physical Activity;Increase Strength and Stamina;Understanding of Exercise Prescription -- Increase Physical Activity;Increase Strength and Stamina;Understanding  of Exercise Prescription Increase Physical Activity;Increase Strength and Stamina;Understanding of Exercise Prescription   Comments Ulice Dash is doing well in rehab.  He is up to 2.3 mph on the treadmill.  We will encourage him to try 4 lb hand weights.  We will continue to monitor his progress. Ulice Dash continues to do well. He has increased his treadmill to 2.4 mph and a 1% incline. He is hitting his THR most sessions. He still has not tried 4 lb yet and we will  continue to encourage him to increase those. Will continue to monitor. Has not attended since last update.  Ulice Dash has had intermitten attendance due to hospital admissions.  He was last here on 5/15 and 5/22.  He was admitted from 5/18-5/20.  Today, he called out with other appointments and has since been sent to ED for GI distress and extreme weight loss. We will continue to follow. Ulice Dash returned for the first time since 5/22 since being admitted in the hospital. He experienced significant weight loss due to the roto virus and only worked on the XR at a light load of level 1. We will continue to work with him and slowly get back into his exercise routine while monitoring any symptoms. Ulice Dash is still recovering from the hospital and still somewhat better but still feels some SOB. He is trying to still walk at home,  about 10-15 minutes at a time. As he recovers and feels better, I encouraged to increase his duration slowly. He states his legs get tired and encouraged to take breaks in between. A goal to reach is to minimize those breaks in between, increase duration as he gets stronger. He has a pulse ox at home and we discussed watching his HR and reviewed his THR.   Expected Outcomes Short: Try 4lb handweights Long: Continue to improve stamina Short: Increase to 4 lb resistance training Long: Continue to increase overall MET level -- Short: Get back into exercise routine Long: Continue to build up strength and stamina Short: Take breaks during walking, minimize over time and increase duration of walks Long: Exercise independently at home for full 30 minutes    Row Name 11/14/21 1502 11/23/21 1004 11/30/21 0748         Exercise Goal Re-Evaluation   Exercise Goals Review Increase Physical Activity;Increase Strength and Stamina;Understanding of Exercise Prescription Increase Physical Activity;Increase Strength and Stamina;Understanding of Exercise Prescription Increase Physical Activity;Increase Strength and  Stamina;Understanding of Exercise Prescription     Comments Domingo is doing well in rehab. He improved his load on the treadmill to a speed of 2.5 mph and an incline of 2.5%. He also improved to level 5 on the T4 machine as well. He also improved to level 5 on the recumbent bike. We will continue to monitor his progress in the program. Ulice Dash reports walking some at time, but his shortness of breath makes it difficult. Encouraged to walk at home for at least 30 minutes 2x/week in the morning when it is not hot out. Ulice Dash is doing well in rehab. He has increased his load back up on the treadmill to a speed of 2.3 mph and an incline of 1.5%. He is also back up to level 3 on the bike. He has also tolerated level 2 on the XR machine. We will continue to monitor his progress in the program.     Expected Outcomes Short: Continue to keep treadmill speed above 2.5 mph. Long: Continue to increase strength and stamina. ST: continue to attend  rehab to improve endurance LT: continue to increase strength and stamina Short: Continue to increase load on treadmill. Long: Continue to increase overall MET levels.              Discharge Exercise Prescription (Final Exercise Prescription Changes):  Exercise Prescription Changes - 11/30/21 0700       Response to Exercise   Blood Pressure (Admit) 118/60    Blood Pressure (Exit) 130/70    Heart Rate (Admit) 70 bpm    Heart Rate (Exercise) 102 bpm    Heart Rate (Exit) 70 bpm    Rating of Perceived Exertion (Exercise) 13    Symptoms none    Duration Continue with 30 min of aerobic exercise without signs/symptoms of physical distress.    Intensity THRR unchanged      Progression   Progression Continue to progress workloads to maintain intensity without signs/symptoms of physical distress.    Average METs 3.56      Resistance Training   Training Prescription Yes    Weight 3 lb    Reps 10-15      Interval Training   Interval Training No      Treadmill   MPH 2.3     Grade 1.5    Minutes 15    METs 3.23      Recumbant Bike   Level 3    Minutes 15    METs 2.75      NuStep   Level 1    Minutes 15    METs 3.4      REL-XR   Level 2    Minutes 15    METs 4.7      Home Exercise Plan   Plans to continue exercise at Home (comment)   walking, staff vidoes   Frequency Add 2 additional days to program exercise sessions.    Initial Home Exercises Provided 08/29/21      Oxygen   Maintain Oxygen Saturation 88% or higher             Nutrition:  Target Goals: Understanding of nutrition guidelines, daily intake of sodium <157m, cholesterol <2026m calories 30% from fat and 7% or less from saturated fats, daily to have 5 or more servings of fruits and vegetables.  Education: All About Nutrition: -Group instruction provided by verbal, written material, interactive activities, discussions, models, and posters to present general guidelines for heart healthy nutrition including fat, fiber, MyPlate, the role of sodium in heart healthy nutrition, utilization of the nutrition label, and utilization of this knowledge for meal planning. Follow up email sent as well. Written material given at graduation. Flow[facility]  Education need identified 07/19/21       Biometrics:  Pre Biometrics - 07/19/21 1441       Pre Biometrics   Height '5\' 8"'  (1.727 m)    Weight 191 lb 4.8 oz (86.8 kg)    BMI (Calculated) 29.09    Single Leg Stand 9.63 seconds              Nutrition Therapy Plan and Nutrition Goals:  Nutrition Therapy & Goals - 11/23/21 0959       Nutrition Therapy   RD appointment deferred Yes   Pt would still not like to meet RD at this time. Will continue to follow up.     Personal Nutrition Goals   Nutrition Goal Pt would still not like to meet RD at this time. Will  continue to follow up.             Nutrition Assessments:  MEDIFICTS Score Key: ?70 Need to  make dietary changes  40-70 Heart Healthy Diet ? 40 Therapeutic Level Cholesterol Diet  Flowsheet Row Cardiac Rehab from 07/19/2021 in [facility] Cardiac and Pulmonary Rehab  Picture Your Plate Total Score on Admission 66      Picture Your Plate Scores: <81 Unhealthy dietary pattern with much room for improvement. 41-50 Dietary pattern unlikely to meet recommendations for good health and room for improvement. 51-60 More healthful dietary pattern, with some room for improvement.  >60 Healthy dietary pattern, although there may be some specific behaviors that could be improved.    Nutrition Goals Re-Evaluation:  Nutrition Goals Re-Evaluation     [place] Name 08/29/21 1018 10/31/21 1020           Goals   Nutrition Goal Continues to decline nurtition appt Continues to decline nutrition appt      Comment Ulice Dash is doing well with his diet.  He keeps a close eye on his salt intake and aims for a good variety.  He is trying to get in lots of fruits and vegetables. --      Expected Outcome Continue to focus on heart healthy diet --               Nutrition Goals Discharge (Final Nutrition Goals Re-Evaluation):  Nutrition Goals Re-Evaluation - 10/31/21 1020       Goals   Nutrition Goal Continues to decline nutrition appt             Psychosocial: Target Goals: Acknowledge presence or absence of significant depression and/or stress, maximize coping skills, provide positive support system. Participant is able to verbalize types and ability to use techniques and skills needed for reducing stress and depression.   Education: Stress, Anxiety, and Depression - Group verbal and visual presentation to define topics covered.  Reviews how body is impacted by stress, anxiety, and depression.  Also discusses healthy ways to reduce stress and to treat/manage anxiety and depression.  Written material given at graduation.   Education: Sleep Hygiene -Provides group verbal and written instruction  about how sleep can affect your health.  Define sleep hygiene, discuss sleep cycles and impact of sleep habits. Review good sleep hygiene tips.    Initial Review & Psychosocial Screening:  Initial Psych Review & Screening - 07/11/21 1036       Initial Review   Current issues with None Identified      Family Dynamics   Good Support System? Yes    Comments He can look to his wife and children for support. He has a positve outlook on his health.      Barriers   Psychosocial barriers to participate in program The patient should benefit from training in stress management and relaxation.;There are no identifiable barriers or psychosocial needs.      Screening Interventions   Interventions Encouraged to exercise;To provide support and resources with identified psychosocial needs;Program counselor consult;Provide feedback about the scores to participant    Expected Outcomes Short Term goal: Utilizing psychosocial counselor, staff and physician to assist with identification of specific Stressors or current issues interfering with healing process. Setting desired goal for each stressor or current issue identified.;Long Term Goal: Stressors or current issues are controlled or eliminated.;Short Term goal: Identification and review with participant of any Quality of Life or Depression concerns found by scoring the questionnaire.;Long Term goal: The participant  improves quality of Life and PHQ9 Scores as seen by post scores and/or verbalization of changes             Quality of Life Scores:   Quality of Life - 07/19/21 1440       Quality of Life   Select Quality of Life      Quality of Life Scores   Health/Function Pre 24.87 %    Socioeconomic Pre 30 %    Psych/Spiritual Pre 30 %    Family Pre 28.8 %    GLOBAL Pre 27.56 %            Scores of 19 and below usually indicate a poorer quality of life in these areas.  A difference of  2-3 points is a clinically meaningful difference.  A  difference of 2-3 points in the total score of the Quality of Life Index has been associated with significant improvement in overall quality of life, self-image, physical symptoms, and general health in studies assessing change in quality of life.  PHQ-9: Review Flowsheet  More data may exist      11/10/2021 09/21/2021 07/19/2021 06/30/2021 06/02/2021  Depression screen PHQ 2/9  Decreased Interest 0 0 0 0 0  Down, Depressed, Hopeless 0 0 0 0 0  PHQ - 2 Score 0 0 0 0 0  Altered sleeping - - 1 - -  Tired, decreased energy - - 1 - -  Change in appetite - - 0 - -  Feeling bad or failure about yourself  - - 0 - -  Trouble concentrating - - 0 - -  Moving slowly or fidgety/restless - - 0 - -  Suicidal thoughts - - 0 - -  PHQ-9 Score - - 2 - -  Difficult doing work/chores - - Not difficult at all - -   Interpretation of Total Score  Total Score Depression Severity:  1-4 = Minimal depression, 5-9 = Mild depression, 10-14 = Moderate depression, 15-19 = Moderately severe depression, 20-27 = Severe depression   Psychosocial Evaluation and Intervention:  Psychosocial Evaluation - 07/11/21 1037       Psychosocial Evaluation & Interventions   Interventions Encouraged to exercise with the program and follow exercise prescription;Relaxation education;Stress management education    Comments He can look to his wife and children for support. He has a positve outlook on his health.    Expected Outcomes Short: Start Heart[facility] to help with mood. Long: Maintain a healthy mental state    Continue Psychosocial Services  Follow up required by staff             Psychosocial Re-Evaluation:  Psychosocial Re-Evaluation     [place] Name 08/15/21 1017 08/29/21 1017 10/31/21 1018 11/23/21 1006       Psychosocial Re-Evaluation   Current issues with None Identified None Identified None Identified None Identified    Comments No new sleep or mental health/stress concerns reported. Ulice Dash is doing well in rehab.  He  denies any major stressors currently and sleeps well most days. He is enjoying coming to rehab and has really liked having the seated equipment to get moving again as walking is a little more difficult for him. Ulice Dash is doing well despite him recovering from the rotavirus. He was recently discharged from the hospial. Overall, he states his sleep is great- he may wake up once during the night but can fall right back asleep. He is supposed to get cataract surgery in August as it has been delayed due  to his other medical problems. Did inform him that he would need to wait at least 1 week before returning to rehab. He has good support at home ,esepcially his wife. He declines any problems with depression/anxiety. He has several follow up appts next couple of weeks he looks forward to going to. Ulice Dash is doing well mentally and reports no stress at this time. He enjoys going to baseball games, but has not gone recently due to shortness of breath. He relies on his wife and children for support - he has 7 grandchildren. He reports sleeping well and wakes up only about once during the night.    Expected Outcomes Short: continue to attend cardiac rehab for mental health benefits related to exercise. Long: Maintain good mental health habits and sleep patterens. Short: Continue to exercise to stay loose and feel good Long: continue to stay positive Short: Continue good attendance with rehab and attend all follow up appointments with providers Long: Continue to maintain posititive attitude Short: Continue good attendance with rehab for mental health boost Long: Continue to maintain posititive attitude    Interventions Encouraged to attend Cardiac Rehabilitation for the exercise Encouraged to attend Cardiac Rehabilitation for the exercise Encouraged to attend Cardiac Rehabilitation for the exercise Encouraged to attend Cardiac Rehabilitation for the exercise    Continue Psychosocial Services  Follow up required by staff -- Follow  up required by staff Follow up required by staff             Psychosocial Discharge (Final Psychosocial Re-Evaluation):  Psychosocial Re-Evaluation - 11/23/21 1006       Psychosocial Re-Evaluation   Current issues with None Identified    Comments Ulice Dash is doing well mentally and reports no stress at this time. He enjoys going to baseball games, but has not gone recently due to shortness of breath. He relies on his wife and children for support - he has 7 grandchildren. He reports sleeping well and wakes up only about once during the night.    Expected Outcomes Short: Continue good attendance with rehab for mental health boost Long: Continue to maintain posititive attitude    Interventions Encouraged to attend Cardiac Rehabilitation for the exercise    Continue Psychosocial Services  Follow up required by staff             Vocational Rehabilitation: Provide vocational rehab assistance to qualifying candidates.   Vocational Rehab Evaluation & Intervention:   Education: Education Goals: Education classes will be provided on a variety of topics geared toward better understanding of heart health and risk factor modification. Participant will state understanding/return demonstration of topics presented as noted by education test scores.  Learning Barriers/Preferences:  Learning Barriers/Preferences - 07/11/21 1035       Learning Barriers/Preferences   Learning Barriers None    Learning Preferences None             General Cardiac Education Topics:  AED/CPR: - Group verbal and written instruction with the use of models to demonstrate the basic use of the AED with the basic ABC's of resuscitation.   Anatomy and Cardiac Procedures: - Group verbal and visual presentation and models provide information about basic cardiac anatomy and function. Reviews the testing methods done to diagnose heart disease and the outcomes of the test results. Describes the treatment choices:  Medical Management, Angioplasty, or Coronary Bypass Surgery for treating various heart conditions including Myocardial Infarction, Angina, Valve Disease, and Cardiac Arrhythmias.  Written material given at graduation.   Medication  Safety: - Group verbal and visual instruction to review commonly prescribed medications for heart and lung disease. Reviews the medication, class of the drug, and side effects. Includes the steps to properly store meds and maintain the prescription regimen.  Written material given at graduation.   Intimacy: - Group verbal instruction through game format to discuss how heart and lung disease can affect sexual intimacy. Written material given at graduation..   Know Your Numbers and Heart Failure: - Group verbal and visual instruction to discuss disease risk factors for cardiac and pulmonary disease and treatment options.  Reviews associated critical values for Overweight/Obesity, Hypertension, Cholesterol, and Diabetes.  Discusses basics of heart failure: signs/symptoms and treatments.  Introduces Heart Failure Zone chart for action plan for heart failure.  Written material given at graduation.   Infection Prevention: - Provides verbal and written material to individual with discussion of infection control including proper hand washing and proper equipment cleaning during exercise session. Flowsheet Row Cardiac Rehab from 07/11/2021 in [facility] Cardiac and Pulmonary Rehab  Date 07/11/21  Educator [facility]  Instruction Review Code 1- Verbalizes Understanding       Falls Prevention: - Provides verbal and written material to individual with discussion of falls prevention and safety. Flowsheet Row Cardiac Rehab from 07/11/2021 in [facility] Cardiac and Pulmonary Rehab  Date 07/11/21  Educator [facility]  Instruction Review Code 1- Verbalizes Understanding       Other: -Provides group and verbal instruction on various topics (see comments)   Knowledge Questionnaire Score:  Knowledge  Questionnaire Score - 07/19/21 1434       Knowledge Questionnaire Score   Pre Score 23/26: Nutrition, Exercise             Core Components/Risk Factors/Patient Goals at Admission:  Personal Goals and Risk Factors at Admission - 07/19/21 1531       Core Components/Risk Factors/Patient Goals on Admission    Weight Management Yes;Weight Loss    Intervention Weight Management: Develop a combined nutrition and exercise program designed to reach desired caloric intake, while maintaining appropriate intake of nutrient and fiber, sodium and fats, and appropriate energy expenditure required for the weight goal.;Weight Management: Provide education and appropriate resources to help participant work on and attain dietary goals.;Weight Management/Obesity: Establish reasonable short term and long term weight goals.    Admit Weight 191 lb (86.6 kg)    Goal Weight: Short Term 187 lb (84.8 kg)    Goal Weight: Long Term 180 lb (81.6 kg)    Expected Outcomes Short Term: Continue to assess and modify interventions until short term weight is achieved;Long Term: Adherence to nutrition and physical activity/exercise program aimed toward attainment of established weight goal;Weight Loss: Understanding of general recommendations for a balanced deficit meal plan, which promotes 1-2 lb weight loss per week and includes a negative energy balance of [phone] kcal/d;Understanding recommendations for meals to include 15-35% energy as protein, 25-35% energy from fat, 35-60% energy from carbohydrates, less than 262m of dietary cholesterol, 20-35 gm of total fiber daily;Understanding of distribution of calorie intake throughout the day with the consumption of 4-5 meals/snacks    Diabetes Yes    Intervention Provide education about signs/symptoms and action to take for hypo/hyperglycemia.;Provide education about proper nutrition, including hydration, and aerobic/resistive exercise prescription along with prescribed  medications to achieve blood glucose in normal ranges: Fasting glucose 65-99 mg/dL    Expected Outcomes Short Term: Participant verbalizes understanding of the signs/symptoms and immediate care of hyper/hypoglycemia, proper foot care and importance of  medication, aerobic/resistive exercise and nutrition plan for blood glucose control.;Long Term: Attainment of HbA1C < 7%.    Heart Failure Yes    Intervention Provide a combined exercise and nutrition program that is supplemented with education, support and counseling about heart failure. Directed toward relieving symptoms such as shortness of breath, decreased exercise tolerance, and extremity edema.    Expected Outcomes Improve functional capacity of life;Short term: Attendance in program 2-3 days a week with increased exercise capacity. Reported lower sodium intake. Reported increased fruit and vegetable intake. Reports medication compliance.;Short term: Daily weights obtained and reported for increase. Utilizing diuretic protocols set by physician.;Long term: Adoption of self-care skills and reduction of barriers for early signs and symptoms recognition and intervention leading to self-care maintenance.    Hypertension Yes    Intervention Provide education on lifestyle modifcations including regular physical activity/exercise, weight management, moderate sodium restriction and increased consumption of fresh fruit, vegetables, and low fat dairy, alcohol moderation, and smoking cessation.;Monitor prescription use compliance.    Expected Outcomes Short Term: Continued assessment and intervention until BP is < 140/62m HG in hypertensive participants. < 130/886mHG in hypertensive participants with diabetes, heart failure or chronic kidney disease.;Long Term: Maintenance of blood pressure at goal levels.    Lipids Yes    Intervention Provide education and support for participant on nutrition & aerobic/resistive exercise along with prescribed medications to  achieve LDL <7075mHDL >44m29m  Expected Outcomes Short Term: Participant states understanding of desired cholesterol values and is compliant with medications prescribed. Participant is following exercise prescription and nutrition guidelines.;Long Term: Cholesterol controlled with medications as prescribed, with individualized exercise RX and with personalized nutrition plan. Value goals: LDL < 70mg76mL > 40 mg.             Education:Diabetes - Individual verbal and written instruction to review signs/symptoms of diabetes, desired ranges of glucose level fasting, after meals and with exercise. Acknowledge that pre and post exercise glucose checks will be done for 3 sessions at entry of program. [place] [facility]  Date 07/11/21  [facility] Code 1- Verbalizes Understanding       Core Components/Risk Factors/Patient Goals Review:   Goals and Risk Factor Review     Row Name 08/15/21 1021 08/29/21 1019 10/31/21 1015 11/23/21 1000       Core Components/Risk Factors/Patient Goals Review   Personal Goals Review Weight Management/Obesity;Heart Failure;Diabetes;Hypertension;Lipids Weight Management/Obesity;Heart Failure;Diabetes;Hypertension;Lipids Weight Management/Obesity;Heart Failure;Diabetes;Hypertension Weight Management/Obesity;Heart Failure;Diabetes;Hypertension    Review Patient reports montioring weight, blood pressure, and blood sugar at home. All levels were reported to be within normal range. Patient is taking all medications as prescribed. Weight has beeen consistent upon monitoring. Jay iUlice Dashoing well in rehab.  He watches his weight closely and will take extra lasix if it is up or if he notices any swelling or increased SOB.  He had a follow up last week with cardiology and got a good report.  His doctor is getting ready to leave so he will need a new cardiologist after July.  His pressures are doing  well and he continues to check them at home.  He is good about checking his sugars each morning and it is usually aound 135 mg/dl each day.  He writes it down along with his pressures each day. Jay iUlice Dashecovering again from the hospital as he had rotavirus. His weight is down to 178-180 lb. He lost about  20 lbs from the hospital and is trying to maintain at this time. They changed his diuretic to torsemide and stays compliant taking it. He also checks his weight everyday and knows to report any abnormal weight gain for potential fluid issues. He sees his cardiologist on Wednesday for follow up and see Jackelyn Hoehn for heart failure follow up next week. Then, he sees PCP the week after that. He hopes for good reports. He is still checking  his blood sugars which range around 110-140 every morning. He states since being out of the hospital his BP at rehab has been on the lower end, declines symptoms but plans to bring attentiion to it as his follow up appts this week. He keep an ongoing log on his weight, BP Jay had lost weight when he was in the hospital recently, but has been stable since - he checks his weight daily, right now at 179lbs. He reports his MD cut down his metformin due to kidney issues; they offered him a medication in the form of a shot, but he could not afford it - his PCP is working to get him on a medication that would work for him (seeing again on 12/08/21). His BG is running about 150 right now. He continues to check his BP daily - 124/78 today when he took it. He reports no heart failure symptoms at this time. He continues to take his medications as prescribed with no issues.    Expected Outcomes Short: continue to take all meds and monitor BP, blood sugar, and weight at home. Long: continue to control cardiac risk factors with heart healthy lifestyle. Short: Conitnue to work on weight loss Long: Conitnue to manage heart failure. Short: Talk to doctor about BP and weight, continue to watch closely  at home Long: Manage heart failure and other risk factors Short: Talk to doctor about BG medication Long: Manage heart failure and other risk factors             Core Components/Risk Factors/Patient Goals at Discharge (Final Review):   Goals and Risk Factor Review - 11/23/21 1000       Core Components/Risk Factors/Patient Goals Review   Personal Goals Review Weight Management/Obesity;Heart Failure;Diabetes;Hypertension    Review Ulice Dash had lost weight when he was in the hospital recently, but has been stable since - he checks his weight daily, right now at 179lbs. He reports his MD cut down his metformin due to kidney issues; they offered him a medication in the form of a shot, but he could not afford it - his PCP is working to get him on a medication that would work for him (seeing again on 12/08/21). His BG is running about 150 right now. He continues to check his BP daily - 124/78 today when he took it. He reports no heart failure symptoms at this time. He continues to take his medications as prescribed with no issues.    Expected Outcomes Short: Talk to doctor about BG medication Long: Manage heart failure and other risk factors             ITP Comments:  ITP Comments     Row Name 07/11/21 1034 07/19/21 1431 08/01/21 1045 08/17/21 1414 09/14/21 0919   ITP Comments Virtual Visit completed. Patient informed on EP and RD appointment and 6 Minute walk test. Patient also informed of patient health questionnaires on My Chart. Patient Verbalizes understanding. Visit diagnosis can be found in [facility] 02/14/2021. Completed 6MWT and gym orientation. Initial  ITP created and sent for review to Dr. Emily Filbert, Medical Director. First full day of exercise!  Patient was oriented to gym and equipment including functions, settings, policies, and procedures.  Patient's individual exercise prescription and treatment plan were reviewed.  All starting workloads were established based on the results of the 6  minute walk test done at initial orientation visit.  The plan for exercise progression was also introduced and progression will be customized based on patient's performance and goals. 30 Day review completed. Medical Director ITP review done, changes made as directed, and signed approval by Medical Director. 30 Day review completed. Medical Director ITP review done, changes made as directed, and signed approval by Medical Director.    [place] Name 10/03/21 1547 10/12/21 1351 11/09/21 1032 12/07/21 0957     ITP Comments Ulice Dash has had intermitten attendance due to hospital admissions.  He was last here on 5/15 and 5/22.  He was admitted from 5/18-5/20.  Today, he called out with other appointments and has since been sent to ED for GI distress and extreme weight loss. We will continue to follow. 30 Day review completed. Medical Director ITP review done, changes made as directed, and signed approval by Medical Director. 30 Day review completed. Medical Director ITP review done, changes made as directed, and signed approval by Medical Director. 30 Day review completed. Medical Director ITP review done, changes made as directed, and signed approval by Medical Director.             Comments:

## 2021-12-12 ENCOUNTER — Encounter: Payer: Medicare Other | Admitting: *Deleted

## 2021-12-12 DIAGNOSIS — Z951 Presence of aortocoronary bypass graft: Secondary | ICD-10-CM

## 2021-12-12 NOTE — Progress Notes (Signed)
Jermaine Kramer decided not to stay for exercise due to not feeling well this morning due to kidney stones.  He was sent home to rest and to come back later this week when he is feeling better.

## 2021-12-21 ENCOUNTER — Ambulatory Visit (INDEPENDENT_AMBULATORY_CARE_PROVIDER_SITE_OTHER): Payer: Medicare Other

## 2021-12-21 ENCOUNTER — Encounter: Payer: Medicare Other | Admitting: *Deleted

## 2021-12-21 DIAGNOSIS — I5022 Chronic systolic (congestive) heart failure: Secondary | ICD-10-CM | POA: Diagnosis not present

## 2021-12-21 DIAGNOSIS — Z951 Presence of aortocoronary bypass graft: Secondary | ICD-10-CM

## 2021-12-21 DIAGNOSIS — I480 Paroxysmal atrial fibrillation: Secondary | ICD-10-CM

## 2021-12-21 NOTE — Progress Notes (Signed)
Daily Session Note  Patient Details  Name: Jermaine Kramer MRN: [id number] Date of Birth: Jun 02, 1950 Referring Provider:   Flowsheet Row Cardiac Rehab from 07/19/2021 in [facility] Cardiac and Pulmonary Rehab  Referring Provider Ernesta Amble MD       Encounter Date: 12/21/2021  Check In:  Session Check In - 12/21/21 1003       Check-In   Supervising physician immediately available to respond to emergencies See telemetry face sheet for immediately available ER MD    Location [name]    Staff Present Heath Lark, RN, BSN, CCRP;Jessica [place], MA, RCEP, CCRP, CCET;Melissa [place], [place], LDN;Joseph [place], [place]    Virtual Visit No    Medication changes reported     No    Fall or balance concerns reported    No    Warm-up and Cool-down Performed on first and last piece of equipment    Resistance Training Performed Yes    VAD Patient? No    PAD/SET Patient? No      Pain Assessment   Currently in Pain? No/denies                Social History   Tobacco Use  Smoking Status Never  Smokeless Tobacco Never    Goals Met:  Independence with exercise equipment Exercise tolerated well No report of concerns or symptoms today  Goals Unmet:  Not Applicable  Comments: Pt able to follow exercise prescription today without complaint.  Will continue to monitor for progression.    Dr. Emily Filbert is Medical Director for [place].  Dr. Ottie Glazier is Medical Director for [facility] Pulmonary Rehabilitation.

## 2021-12-22 LAB — CUP PACEART REMOTE DEVICE CHECK
Battery Remaining Longevity: 117 mo
Battery Voltage: 3 V
Brady Statistic AP VP Percent: 0.03 %
Brady Statistic AP VS Percent: 33.28 %
Brady Statistic AS VP Percent: 0.01 %
Brady Statistic AS VS Percent: 66.69 %
Brady Statistic RA Percent Paced: 33.28 %
Brady Statistic RV Percent Paced: 0.04 %
Date Time Interrogation Session: 20230824052826
HighPow Impedance: 53 Ohm
Implantable Lead Implant Date: 20230222
Implantable Lead Implant Date: 20230222
Implantable Lead Location: 753859
Implantable Lead Location: 753860
Implantable Lead Model: 5076
Implantable Pulse Generator Implant Date: 20230222
Lead Channel Impedance Value: 399 Ohm
Lead Channel Impedance Value: 418 Ohm
Lead Channel Impedance Value: 513 Ohm
Lead Channel Pacing Threshold Amplitude: 0.5 V
Lead Channel Pacing Threshold Amplitude: 0.625 V
Lead Channel Pacing Threshold Pulse Width: 0.4 ms
Lead Channel Pacing Threshold Pulse Width: 0.4 ms
Lead Channel Sensing Intrinsic Amplitude: 1.75 mV
Lead Channel Sensing Intrinsic Amplitude: 1.75 mV
Lead Channel Sensing Intrinsic Amplitude: 11 mV
Lead Channel Sensing Intrinsic Amplitude: 11 mV
Lead Channel Setting Pacing Amplitude: 1.5 V
Lead Channel Setting Pacing Amplitude: 1.5 V
Lead Channel Setting Pacing Pulse Width: 0.4 ms
Lead Channel Setting Sensing Sensitivity: 0.3 mV

## 2021-12-26 ENCOUNTER — Encounter: Payer: Medicare Other | Admitting: *Deleted

## 2021-12-26 DIAGNOSIS — Z951 Presence of aortocoronary bypass graft: Secondary | ICD-10-CM

## 2021-12-26 NOTE — Progress Notes (Signed)
Daily Session Note  Patient Details  Name: Jermaine Kramer MRN: [id number] Date of Birth: 09-21-1950 Referring Provider:   Flowsheet Row Cardiac Rehab from 07/19/2021 in [facility] Cardiac and Pulmonary Rehab  Referring Provider Ernesta Amble MD       Encounter Date: 12/26/2021  Check In:  Session Check In - 12/26/21 0957       Check-In   Supervising physician immediately available to respond to emergencies See telemetry face sheet for immediately available ER MD    Location [name]    Staff Present Heath Lark, RN, BSN, CCRP;Joseph [place], RCP,RRT,BSRT;Kelly [place], [place], ACSM CEP, Exercise Physiologist    Virtual Visit No    Medication changes reported     No    Fall or balance concerns reported    No    Warm-up and Cool-down Performed on first and last piece of equipment    Resistance Training Performed Yes    VAD Patient? No    PAD/SET Patient? No      Pain Assessment   Currently in Pain? No/denies                Social History   Tobacco Use  Smoking Status Never  Smokeless Tobacco Never    Goals Met:  Independence with exercise equipment Exercise tolerated well No report of concerns or symptoms today  Goals Unmet:  Not Applicable  Comments: Pt able to follow exercise prescription today without complaint.  Will continue to monitor for progression.    Dr. Emily Filbert is Medical Director for [place].  Dr. Ottie Glazier is Medical Director for [facility] Pulmonary Rehabilitation.

## 2021-12-28 ENCOUNTER — Encounter: Payer: Medicare Other | Admitting: *Deleted

## 2021-12-28 DIAGNOSIS — Z951 Presence of aortocoronary bypass graft: Secondary | ICD-10-CM | POA: Diagnosis not present

## 2021-12-28 NOTE — Progress Notes (Signed)
Daily Session Note  Patient Details  Name: Jermaine Kramer MRN: [id number] Date of Birth: 20-Jul-1950 Referring Provider:   Flowsheet Row Cardiac Rehab from 07/19/2021 in [facility] Re[facility]  Referring Provider Ernesta Amble MD       Encounter Date: 12/28/2021  Check In:  Session Check In - 12/28/21 1044       Check-In   Supervising physician immediately available to respond to emergencies See telemetry face sheet for immediately available ER MD    Location [name]    Staff Present Nyoka Cowden, RN, BSN, Fenton Foy, BS, Exercise Physiologist;Joseph [place], RCP,RRT,BSRT;Melissa [place], [place], LDN    Virtual Visit No    Medication changes reported     No    Fall or balance concerns reported    No    Tobacco Cessation No Change    Warm-up and Cool-down Performed on first and last piece of equipment    Resistance Training Performed No    VAD Patient? No    PAD/SET Patient? No      Pain Assessment   Currently in Pain? No/denies                Social History   Tobacco Use  Smoking Status Never  Smokeless Tobacco Never    Goals Met:  Independence with exercise equipment Exercise tolerated well No report of concerns or symptoms today  Goals Unmet:  Not Applicable  Comments: Pt able to follow exercise prescription today without complaint.  Will continue to monitor for progression.    Dr. Emily Filbert is Medical Director for [facility].  Dr. Ottie Glazier is Medical Director for [facility] Pulmonary Rehabilitation.

## 2022-01-04 ENCOUNTER — Encounter: Payer: Self-pay | Admitting: *Deleted

## 2022-01-04 ENCOUNTER — Encounter: Payer: Medicare Other | Attending: Internal Medicine | Admitting: *Deleted

## 2022-01-04 VITALS — Ht 68.0 in | Wt 181.2 lb

## 2022-01-04 DIAGNOSIS — Z951 Presence of aortocoronary bypass graft: Secondary | ICD-10-CM | POA: Diagnosis present

## 2022-01-04 NOTE — Patient Instructions (Addendum)
Discharge Patient Instructions  Patient Details  Name: Jermaine Kramer MRN: [id number] Date of Birth: 06/17/1950 Referring Provider:  Sherre Scarlet, P*   Number of Visits: 28  Reason for Discharge:  Patient reached a stable level of exercise. Patient independent in their exercise. Patient has met program and personal goals. Early Exit:  Patient reached 18 weeks in the program.  Diagnosis:  S/P CABG x 3  Initial Exercise Prescription:  Initial Exercise Prescription - 07/19/21 1500       Date of Initial Exercise RX and Referring Provider   Date 07/19/21    Referring Provider Ernesta Amble MD      Oxygen   Maintain Oxygen Saturation 88% or higher      Treadmill   MPH 2.1    Grade 0.5    Minutes 15    METs 2.75      Recumbant Bike   Level 2    RPM 60    Watts 20    Minutes 15    METs 2.7      REL-XR   Level 1    Speed 50    Minutes 15    METs 2.7      Prescription Details   Frequency (times per week) 2    Duration Progress to 30 minutes of continuous aerobic without signs/symptoms of physical distress      Intensity   THRR 40-80% of Max Heartrate 104-133    Ratings of Perceived Exertion 11-13    Perceived Dyspnea 0-4      Progression   Progression Continue to progress workloads to maintain intensity without signs/symptoms of physical distress.      Resistance Training   Training Prescription Yes    Weight 3 lb   ROM until 4//6: Recent ICD placement   Reps 10-15             Discharge Exercise Prescription (Final Exercise Prescription Changes):  Exercise Prescription Changes - 12/26/21 1500       Response to Exercise   Blood Pressure (Admit) 122/72    Blood Pressure (Exit) 118/64    Heart Rate (Admit) 70 bpm    Heart Rate (Exercise) 86 bpm    Heart Rate (Exit) 84 bpm    Oxygen Saturation (Admit) 97 %    Oxygen Saturation (Exercise) 92 %    Oxygen Saturation (Exit) 97 %    Rating of Perceived Exertion (Exercise) 13    Perceived  Dyspnea (Exercise) 2    Symptoms none    Duration Continue with 30 min of aerobic exercise without signs/symptoms of physical distress.    Intensity THRR unchanged      Progression   Progression Continue to progress workloads to maintain intensity without signs/symptoms of physical distress.    Average METs 3.13      Resistance Training   Training Prescription Yes    Weight 3 lb    Reps 10-15      Interval Training   Interval Training No      Treadmill   MPH 2.2    Grade 3    Minutes 15    METs 3.59      Recumbant Bike   Level 2    Minutes 15      NuStep   Level 1    Minutes 15    METs 2.8      Biostep-RELP   Level 2    Minutes 15    METs 3  Home Exercise Plan   Plans to continue exercise at Home (comment)   walking, staff vidoes   Frequency Add 2 additional days to program exercise sessions.    Initial Home Exercises Provided 08/29/21      Oxygen   Maintain Oxygen Saturation 88% or higher             Functional Capacity:  6 Minute Walk     Row Name 07/19/21 1458 01/04/22 1010       6 Minute Walk   Phase Initial Discharge    Distance 1225 feet 1200 feet    Distance % Change -- -2 %    Distance Feet Change -- -25 ft    Walk Time 6 minutes 6 minutes    # of Rest Breaks 0 0    MPH 2.32 2.27    METS 2.76 2.5    RPE 11 13    Perceived Dyspnea  1 1    VO2 Peak 9.66 8.74    Symptoms Yes (comment) Yes (comment)    Comments SOB, legs fatigued SOB, Hip Pain (2/10)    Resting HR 75 bpm 70 bpm    Resting BP 104/64 108/66    Resting Oxygen Saturation  97 % 96 %    Exercise Oxygen Saturation  during 6 min walk 95 % 94 %    Max Ex. HR 111 bpm 106 bpm    Max Ex. BP 140/68 114/64    2 Minute Post BP 108/68 --             Quality of Life:  Quality of Life - 01/04/22 1032       Quality of Life Scores   Health/Function Pre 25.37 %    Socioeconomic Pre 30 %    Psych/Spiritual Pre 30 %    Family Pre 30 %    GLOBAL Pre 28.01 %              Nutrition & Weight - Outcomes:  Pre Biometrics - 07/19/21 1441       Pre Biometrics   Height '5\' 8"'  (1.727 m)    Weight 191 lb 4.8 oz (86.8 kg)    BMI (Calculated) 29.09    Single Leg Stand 9.63 seconds             Post Biometrics - 01/04/22 1011        Post  Biometrics   Height '5\' 8"'  (1.727 m)    Weight 181 lb 3.2 oz (82.2 kg)    BMI (Calculated) 27.56            Nutrition:  Nutrition Therapy & Goals - 11/23/21 0959       Nutrition Therapy   RD appointment deferred Yes   Pt would still not like to meet RD at this time. Will continue to follow up.     Personal Nutrition Goals   Nutrition Goal Pt would still not like to meet RD at this time. Will continue to follow up.              Goals reviewed with patient; copy given to patient.

## 2022-01-04 NOTE — Progress Notes (Signed)
Cardiac Individual Treatment Plan  Patient Details  Name: Jermaine Kramer MRN: [id number] Date of Birth: Nov 04, 1950 Referring Provider:   Flowsheet Row Cardiac Rehab from 07/19/2021 in [facility] Cardiac and Pulmonary Rehab  Referring Provider Ernesta Amble MD       Initial Encounter Date:  Flowsheet Row Cardiac Rehab from 07/19/2021 in [facility] Cardiac and Pulmonary Rehab  Date 07/19/21       Visit Diagnosis: S/P CABG x 3  Patient's Home Medications on Admission:  Current Outpatient Medications:    amiodarone (PACERONE) 200 MG tablet, Take 200 mg by mouth daily., Disp: , Rfl:    apixaban (ELIQUIS) 5 MG TABS tablet, Take 1 tablet (5 mg total) by mouth 2 (two) times daily., Disp: 180 tablet, Rfl: 3   aspirin EC 81 MG tablet, Take 81 mg by mouth daily., Disp: , Rfl:    atorvastatin (LIPITOR) 80 MG tablet, Take 80 mg by mouth daily., Disp: , Rfl:    bisacodyl (DULCOLAX) 5 MG EC tablet, Take 1 tablet (5 mg total) by mouth daily as needed for moderate constipation., Disp: 30 tablet, Rfl: 1   glucose blood (PRECISION QID TEST) test strip, Use 3 (three) times daily Use as instructed. One touch ultra, Disp: , Rfl:    lactulose (CHRONULAC) 10 GM/15ML solution, Take 30 mLs (20 g total) by mouth 2 (two) times daily as needed for mild constipation., Disp: 236 mL, Rfl: 0   metFORMIN (GLUCOPHAGE) 1000 MG tablet, Take 500 mg by mouth 2 (two) times daily with a meal., Disp: , Rfl:    metoprolol succinate (TOPROL-XL) 25 MG 24 hr tablet, Take 0.5 tablets (12.5 mg total) by mouth every evening., Disp: 45 tablet, Rfl: 3   omeprazole (PRILOSEC) 40 MG capsule, Take 40 mg by mouth 2 (two) times daily., Disp: , Rfl:    ondansetron (ZOFRAN) 4 MG tablet, Take 4 mg by mouth every 8 (eight) hours as needed., Disp: , Rfl:    spironolactone (ALDACTONE) 25 MG tablet, Take 12.5 mg by mouth daily., Disp: , Rfl:    tamsulosin (FLOMAX) 0.4 MG CAPS capsule, Take 0.8 mg by mouth in the morning and at bedtime., Disp: , Rfl:     torsemide (DEMADEX) 20 MG tablet, Take 1 tablet (20 mg total) by mouth daily. And additional 42m if needed, Disp: 40 tablet, Rfl: 3  Past Medical History: Past Medical History:  Diagnosis Date   Arrhythmia    atrial fibrillation   CHF (congestive heart failure) (HCC)    Coronary artery disease    Diabetes mellitus without complication (HCC)    GERD (gastroesophageal reflux disease)    Hypercholesteremia    Hypertension    Sleep apnea    Stroke ([place]     Tobacco Use: Social History   Tobacco Use  Smoking Status Never  Smokeless Tobacco Never    Labs: Review Flowsheet       Latest Ref Rng & Units 04/21/2021 05/16/2021  Labs for ITP Cardiac and Pulmonary Rehab  Hemoglobin A1c 4.8 - 5.6 % - 7.8   Bicarbonate 20.0 - 28.0 mmol/L 23.2  -  Acid-base deficit 0.0 - 2.0 mmol/L 0.6  -  O2 Saturation % 97.4  -     Exercise Target Goals: Exercise Program Goal: Individual exercise prescription set using results from initial 6 min walk test and THRR while considering  patient's activity barriers and safety.   Exercise Prescription Goal: Initial exercise prescription builds to 30-45 minutes a day of aerobic activity, 2-3 days per  week.  Home exercise guidelines will be given to patient during program as part of exercise prescription that the participant will acknowledge.   Education: Aerobic Exercise: - Group verbal and visual presentation on the components of exercise prescription. Introduces F.I.T.T principle from ACSM for exercise prescriptions.  Reviews F.I.T.T. principles of aerobic exercise including progression. Written material given at graduation. Flowsheet Row Cardiac Rehab from 07/19/2021 in [facility] Cardiac and Pulmonary Rehab  Education need identified 07/19/21       Education: Resistance Exercise: - Group verbal and visual presentation on the components of exercise prescription. Introduces F.I.T.T principle from ACSM for exercise prescriptions  Reviews F.I.T.T.  principles of resistance exercise including progression. Written material given at graduation.    Education: Exercise & Equipment Safety: - Individual verbal instruction and demonstration of equipment use and safety with use of the equipment. Flowsheet Row Cardiac Rehab from 07/11/2021 in [facility] Cardiac and Pulmonary Rehab  Date 07/11/21  Educator [facility]  Instruction Review Code 1- Verbalizes Understanding       Education: Exercise Physiology & General Exercise Guidelines: - Group verbal and written instruction with models to review the exercise physiology of the cardiovascular system and associated critical values. Provides general exercise guidelines with specific guidelines to those with heart or lung disease.  Flowsheet Row Cardiac Rehab from 07/19/2021 in [facility] Cardiac and Pulmonary Rehab  Education need identified 07/19/21       Education: Flexibility, Balance, Mind/Body Relaxation: - Group verbal and visual presentation with interactive activity on the components of exercise prescription. Introduces F.I.T.T principle from ACSM for exercise prescriptions. Reviews F.I.T.T. principles of flexibility and balance exercise training including progression. Also discusses the mind body connection.  Reviews various relaxation techniques to help reduce and manage stress (i.e. Deep breathing, progressive muscle relaxation, and visualization). Balance handout provided to take home. Written material given at graduation.   Activity Barriers & Risk Stratification:  Activity Barriers & Cardiac Risk Stratification - 07/19/21 1442       Activity Barriers & Cardiac Risk Stratification   Activity Barriers Decreased Ventricular Function;Other (comment);Deconditioning    Comments Per patient, weight restrictions until 4/6- recent ICD placement    Cardiac Risk Stratification High             6 Minute Walk:  6 Minute Walk     Row Name 07/19/21 1458 01/04/22 1010       6 Minute Walk   Phase  Initial Discharge    Distance 1225 feet 1200 feet    Distance % Change -- -2 %    Distance Feet Change -- -25 ft    Walk Time 6 minutes 6 minutes    # of Rest Breaks 0 0    MPH 2.32 2.27    METS 2.76 2.5    RPE 11 13    Perceived Dyspnea  1 1    VO2 Peak 9.66 8.74    Symptoms Yes (comment) Yes (comment)    Comments SOB, legs fatigued SOB, Hip Pain (2/10)    Resting HR 75 bpm 70 bpm    Resting BP 104/64 108/66    Resting Oxygen Saturation  97 % 96 %    Exercise Oxygen Saturation  during 6 min walk 95 % 94 %    Max Ex. HR 111 bpm 106 bpm    Max Ex. BP 140/68 114/64    2 Minute Post BP 108/68 --             Oxygen Initial Assessment:  Oxygen Re-Evaluation:   Oxygen Discharge (Final Oxygen Re-Evaluation):   Initial Exercise Prescription:  Initial Exercise Prescription - 07/19/21 1500       Date of Initial Exercise RX and Referring Provider   Date 07/19/21    Referring Provider Ernesta Amble MD      Oxygen   Maintain Oxygen Saturation 88% or higher      Treadmill   MPH 2.1    Grade 0.5    Minutes 15    METs 2.75      Recumbant Bike   Level 2    RPM 60    Watts 20    Minutes 15    METs 2.7      REL-XR   Level 1    Speed 50    Minutes 15    METs 2.7      Prescription Details   Frequency (times per week) 2    Duration Progress to 30 minutes of continuous aerobic without signs/symptoms of physical distress      Intensity   THRR 40-80% of Max Heartrate 104-133    Ratings of Perceived Exertion 11-13    Perceived Dyspnea 0-4      Progression   Progression Continue to progress workloads to maintain intensity without signs/symptoms of physical distress.      Resistance Training   Training Prescription Yes    Weight 3 lb   ROM until 4//6: Recent ICD placement   Reps 10-15             Perform Capillary Blood Glucose checks as needed.  Exercise Prescription Changes:   Exercise Prescription Changes     Row Name 07/19/21 1500 08/09/21 1600  08/22/21 0900 08/29/21 1000 09/05/21 1500     Response to Exercise   Blood Pressure (Admit) 104/64 118/60 136/64 -- 110/62   Blood Pressure (Exercise) 140/68 150/72 134/74 -- 122/64   Blood Pressure (Exit) 108/68 102/54 104/64 -- 102/58   Heart Rate (Admit) 75 bpm 93 bpm 94 bpm -- 82 bpm   Heart Rate (Exercise) 111 bpm 118 bpm 114 bpm -- 119 bpm   Heart Rate (Exit) 79 bpm 101 bpm 94 bpm -- 89 bpm   Oxygen Saturation (Admit) 97 % -- -- -- --   Oxygen Saturation (Exercise) 95 % -- -- -- --   Rating of Perceived Exertion (Exercise) _0 -- 13   Perceived Dyspnea (Exercise) 1 -- -- -- --   Symptoms SOB, legs fatigued none none -- none   Comments walk test results 2nd full day of exercise -- -- --   Duration -- Progress to 30 minutes of  aerobic without signs/symptoms of physical distress Continue with 30 min of aerobic exercise without signs/symptoms of physical distress. -- Continue with 30 min of aerobic exercise without signs/symptoms of physical distress.   Intensity -- THRR unchanged THRR unchanged -- THRR unchanged     Progression   Progression -- Continue to progress workloads to maintain intensity without signs/symptoms of physical distress. Continue to progress workloads to maintain intensity without signs/symptoms of physical distress. -- Continue to progress workloads to maintain intensity without signs/symptoms of physical distress.   Average METs -- 2.6 3.01 -- 3.81     Resistance Training   Training Prescription -- Yes Yes -- Yes   Weight -- 3 lb 3 lb -- 3 lb   Reps -- 10-15 10-15 -- 10-15     Interval Training   Interval Training -- No No -- No  Treadmill   MPH -- 2.1 2.1 -- 2.3   Grade -- 0.5 0.5 -- 0.5   Minutes -- 15 15 -- 15   METs -- 2.75 2.75 -- 2.92     Recumbant Bike   Level -- 2 3 -- 2   Watts -- 36 27 -- --   Minutes -- 15 15 -- 15   METs -- -- 2.29 -- --     NuStep   Level -- -- -- -- 4   Minutes -- -- -- -- 15   METs -- -- -- -- 4      REL-XR   Level -- 2 3 -- 3   Minutes -- 15 15 -- 15   METs -- 5.1 4.4 -- 4.5     Home Exercise Plan   Plans to continue exercise at -- -- -- Home (comment)  walking, staff vidoes Home (comment)  walking, staff vidoes   Frequency -- -- -- Add 2 additional days to program exercise sessions. Add 2 additional days to program exercise sessions.   Initial Home Exercises Provided -- -- -- 08/29/21 08/29/21     Oxygen   Maintain Oxygen Saturation -- 88% or higher 88% or higher -- 88% or higher    Row Name 09/22/21 1400 10/18/21 1600 11/14/21 1400 11/30/21 0700 12/26/21 1500     Response to Exercise   Blood Pressure (Admit) 110/62 96/60 126/64 118/60 122/72   Blood Pressure (Exercise) 130/68 -- -- -- --   Blood Pressure (Exit) 108/64 94/56 122/64 130/70 118/64   Heart Rate (Admit) 87 bpm 81 bpm 69 bpm 70 bpm 70 bpm   Heart Rate (Exercise) 118 bpm 101 bpm 119 bpm 102 bpm 86 bpm   Heart Rate (Exit) 92 bpm 81 bpm 76 bpm 70 bpm 84 bpm   Oxygen Saturation (Admit) -- -- -- -- 97 %   Oxygen Saturation (Exercise) -- -- -- -- 92 %   Oxygen Saturation (Exit) -- -- -- -- 97 %   Rating of Perceived Exertion (Exercise) _0 Perceived Dyspnea (Exercise) -- -- -- -- 2   Symptoms none significant weight loss due to roto virus none none none   Comments -- return back from hospital -- -- --   Duration Continue with 30 min of aerobic exercise without signs/symptoms of physical distress. Continue with 30 min of aerobic exercise without signs/symptoms of physical distress. Continue with 30 min of aerobic exercise without signs/symptoms of physical distress. Continue with 30 min of aerobic exercise without signs/symptoms of physical distress. Continue with 30 min of aerobic exercise without signs/symptoms of physical distress.   Intensity _1      Progression   Progression Continue to progress workloads to maintain intensity without  signs/symptoms of physical distress. Continue to progress workloads to maintain intensity without signs/symptoms of physical distress. Continue to progress workloads to maintain intensity without signs/symptoms of physical distress. Continue to progress workloads to maintain intensity without signs/symptoms of physical distress. Continue to progress workloads to maintain intensity without signs/symptoms of physical distress.   Average METs 3.41 4.8 3.53 3.56 3.13     Resistance Training   Training Prescription _2    Weight 3 lb 3 lb 3 lb 3 lb 3 lb   Reps 10-15 10-15 10-15 10-15 10-15     Interval Training   Interval Training _3      Treadmill  MPH 2.4 -- 2.5 2.3 2.2   Grade 1 -- 2.5 1.5 3   Minutes 15 -- _0 METs 3.17 -- 3.78 3.23 3.59     Recumbant Bike   Level 2 -- _1 Watts 25 -- -- -- --   Minutes 15 -- _2 METs 2.72 -- 2.75 2.75 --     NuStep   Level 4 -- _3 Minutes 15 -- _4 METs 3.7 -- 3 3.4 2.8     REL-XR   Level _5 --   Minutes _6 --   METs 4.9 4.7 4.8 4.7 --     Biostep-RELP   Level -- -- -- -- 2   Minutes -- -- -- -- 15   METs -- -- -- -- 3     Home Exercise Plan   Plans to continue exercise at Home (comment)  walking, staff vidoes Home (comment)  walking, staff vidoes Home (comment)  walking, staff vidoes Home (comment)  walking, staff vidoes Home (comment)  walking, staff vidoes   Frequency Add 2 additional days to program exercise sessions. Add 2 additional days to program exercise sessions. Add 2 additional days to program exercise sessions. Add 2 additional days to program exercise sessions. Add 2 additional days to program exercise sessions.   Initial Home Exercises Provided 08/29/21 08/29/21 08/29/21 08/29/21 08/29/21     Oxygen   Maintain Oxygen Saturation 88% or higher 88% or higher 88% or higher 88% or higher 88% or higher            Exercise Comments:   Exercise Comments      Row Name 01/04/22 1147           Exercise Comments Kayan graduated today from  rehab with 28 sessions completed.  Details of the patient's exercise prescription and what He needs to do in order to continue the prescription and progress were discussed with patient.  Patient was given a copy of prescription and goals.  Patient verbalized understanding.  Avien plans to continue to exercise by exercising at home.                Exercise Goals and Review:   Exercise Goals     Row Name 07/19/21 1530             Exercise Goals   Increase Physical Activity Yes       Intervention Provide advice, education, support and counseling about physical activity/exercise needs.;Develop an individualized exercise prescription for aerobic and resistive training based on initial evaluation findings, risk stratification, comorbidities and participant's personal goals.       Expected Outcomes Short Term: Attend rehab on a regular basis to increase amount of physical activity.;Long Term: Add in home exercise to make exercise part of routine and to increase amount of physical activity.;Long Term: Exercising regularly at least 3-5 days a week.       Increase Strength and Stamina Yes       Intervention Provide advice, education, support and counseling about physical activity/exercise needs.;Develop an individualized exercise prescription for aerobic and resistive training based on initial evaluation findings, risk stratification, comorbidities and participant's personal goals.       Expected Outcomes Short Term: Perform resistance training exercises routinely during rehab and add in resistance training at home;Long Term: Improve cardiorespiratory fitness, muscular endurance and strength as measured by increased METs  and functional capacity (6MWT);Short Term: Increase workloads from initial exercise prescription for resistance, speed, and METs.       Able to understand and use rate of perceived exertion (RPE)  scale Yes       Intervention Provide education and explanation on how to use RPE scale       Expected Outcomes Short Term: Able to use RPE daily in rehab to express subjective intensity level;Long Term:  Able to use RPE to guide intensity level when exercising independently       Able to understand and use Dyspnea scale Yes       Intervention Provide education and explanation on how to use Dyspnea scale       Expected Outcomes Short Term: Able to use Dyspnea scale daily in rehab to express subjective sense of shortness of breath during exertion;Long Term: Able to use Dyspnea scale to guide intensity level when exercising independently       Knowledge and understanding of Target Heart Rate Range (THRR) Yes       Intervention Provide education and explanation of THRR including how the numbers were predicted and where they are located for reference       Expected Outcomes Short Term: Able to state/look up THRR;Long Term: Able to use THRR to govern intensity when exercising independently;Short Term: Able to use daily as guideline for intensity in rehab       Able to check pulse independently Yes       Intervention Provide education and demonstration on how to check pulse in carotid and radial arteries.;Review the importance of being able to check your own pulse for safety during independent exercise       Expected Outcomes Short Term: Able to explain why pulse checking is important during independent exercise;Long Term: Able to check pulse independently and accurately       Understanding of Exercise Prescription Yes       Intervention Provide education, explanation, and written materials on patient's individual exercise prescription       Expected Outcomes Short Term: Able to explain program exercise prescription;Long Term: Able to explain home exercise prescription to exercise independently                Exercise Goals Re-Evaluation :  Exercise Goals Re-Evaluation     Row Name 08/01/21 1046  08/09/21 1607 08/15/21 1012 08/22/21 0933 08/29/21 1016     Exercise Goal Re-Evaluation   Exercise Goals Review Increase Physical Activity;Able to understand and use rate of perceived exertion (RPE) scale;Knowledge and understanding of Target Heart Rate Range (THRR);Understanding of Exercise Prescription;Increase Strength and Stamina;Able to check pulse independently Increase Physical Activity;Increase Strength and Stamina Increase Physical Activity;Increase Strength and Stamina Increase Physical Activity;Increase Strength and Stamina Understanding of Exercise Prescription;Able to check pulse independently;Knowledge and understanding of Target Heart Rate Range (THRR);Able to understand and use Dyspnea scale;Able to understand and use rate of perceived exertion (RPE) scale;Increase Physical Activity;Increase Strength and Stamina   Comments Reviewed RPE and dyspnea scales, THR and program prescription with pt today.  Pt voiced understanding and was given a copy of goals to take home. Kota is doing well for the first couple of sessions that he has been here. He is tolerating his exercise prescription well and is already up to level 2 on the XR. We will continue to monitor as he progresses throughout the program. Rilan states that he does feel like his exercise is going well and he is increasing workloads and feels stronger.  His SOB is about the same and is his main limiting factor. Ulice Dash continues to do well in rehab. He has increased to level 3 on both the XR and RB.  He would benefit by increasing his load on the treadmill, if tolerated. Will continue to monitor. Reviewed home exercise with pt today.  Pt plans to walk and use staff videos at home for exercise.  Reviewed THR, pulse, RPE, sign and symptoms, pulse oximetery and when to call 911 or MD.  Also discussed weather considerations and indoor options.  Pt voiced understanding.   Expected Outcomes Short: Use RPE daily to regulate intensity. Long: Follow  program prescription in THR. Short: Increase tolerance and loads on treadmill Long: Continue to increase overall MET level Short: continiue to attend cardiac rehab regularly and walk at home. Long: increase overall MET levels and become independent with exercise routine. Short: Increase loads on the treadmill as tolerated Long: Continue to build up overall strength and stamina Short: Start to add in more walking at home Long: continue to exercise independently    [place] Name 09/05/21 1557 09/22/21 1406 10/03/21 1549 10/18/21 1636 10/31/21 1020     Exercise Goal Re-Evaluation   Exercise Goals Review Increase Physical Activity;Increase Strength and Stamina;Understanding of Exercise Prescription Increase Physical Activity;Increase Strength and Stamina;Understanding of Exercise Prescription -- Increase Physical Activity;Increase Strength and Stamina;Understanding of Exercise Prescription Increase Physical Activity;Increase Strength and Stamina;Understanding of Exercise Prescription   Comments Ulice Dash is doing well in rehab.  He is up to 2.3 mph on the treadmill.  We will encourage him to try 4 lb hand weights.  We will continue to monitor his progress. Ulice Dash continues to do well. He has increased his treadmill to 2.4 mph and a 1% incline. He is hitting his THR most sessions. He still has not tried 4 lb yet and we will continue to encourage him to increase those. Will continue to monitor. Has not attended since last update.  Ulice Dash has had intermitten attendance due to hospital admissions.  He was last here on 5/15 and 5/22.  He was admitted from 5/18-5/20.  Today, he called out with other appointments and has since been sent to ED for GI distress and extreme weight loss. We will continue to follow. Ulice Dash returned for the first time since 5/22 since being admitted in the hospital. He experienced significant weight loss due to the roto virus and only worked on the XR at a light load of level 1. We will continue to work with him  and slowly get back into his exercise routine while monitoring any symptoms. Ulice Dash is still recovering from the hospital and still somewhat better but still feels some SOB. He is trying to still walk at home,  about 10-15 minutes at a time. As he recovers and feels better, I encouraged to increase his duration slowly. He states his legs get tired and encouraged to take breaks in between. A goal to reach is to minimize those breaks in between, increase duration as he gets stronger. He has a pulse ox at home and we discussed watching his HR and reviewed his THR.   Expected Outcomes Short: Try 4lb handweights Long: Continue to improve stamina Short: Increase to 4 lb resistance training Long: Continue to increase overall MET level -- Short: Get back into exercise routine Long: Continue to build up strength and stamina Short: Take breaks during walking, minimize over time and increase duration of walks Long: Exercise independently at home for full 30 minutes  [place] Name 11/14/21 1502 11/23/21 1004 11/30/21 0748 12/12/21 1208 12/21/21 0954     Exercise Goal Re-Evaluation   Exercise Goals Review Increase Physical Activity;Increase Strength and Stamina;Understanding of Exercise Prescription Increase Physical Activity;Increase Strength and Stamina;Understanding of Exercise Prescription Increase Physical Activity;Increase Strength and Stamina;Understanding of Exercise Prescription Increase Physical Activity;Increase Strength and Stamina;Understanding of Exercise Prescription Increase Physical Activity;Increase Strength and Stamina;Understanding of Exercise Prescription   Comments Nadia is doing well in rehab. He improved his load on the treadmill to a speed of 2.5 mph and an incline of 2.5%. He also improved to level 5 on the T4 machine as well. He also improved to level 5 on the recumbent bike. We will continue to monitor his progress in the program. Ulice Dash reports walking some at time, but his shortness of breath makes it  difficult. Encouraged to walk at home for at least 30 minutes 2x/week in the morning when it is not hot out. Ulice Dash is doing well in rehab. He has increased his load back up on the treadmill to a speed of 2.3 mph and an incline of 1.5%. He is also back up to level 3 on the bike. He has also tolerated level 2 on the XR machine. We will continue to monitor his progress in the program. Ulice Dash has been out since last review. He has been dealing with passing a kidney stone and was still not able to exercise today due to the pain. We will continue to follow up with patient on how he is feeling. Ulice Dash has been doing well in the program. Today is his first day back at rehab since dealing with a pain in his side. He still is struggling with SOB during activity. He does state that he feels his endurance has improved since starting the program. Ulice Dash has been walking at home on his days away from rehab. He does state that his home exercise has been inconsistent due to the heat. We will continue to monitor his progress in the program.   Expected Outcomes Short: Continue to keep treadmill speed above 2.5 mph. Long: Continue to increase strength and stamina. ST: continue to attend rehab to improve endurance LT: continue to increase strength and stamina Short: Continue to increase load on treadmill. Long: Continue to increase overall MET levels. Short: Maintain good attendance when cleared to return Long: Improve strength stamina Short: Continue to walk at home on days off from rehab. Long: Improve strength and stamina.    [place] Name 12/26/21 1457             Exercise Goal Re-Evaluation   Exercise Goals Review Increase Physical Activity;Increase Strength and Stamina;Understanding of Exercise Prescription       Comments Ulice Dash has returned to improved attendance again.  We are not seeing much progress due to attendance caused by on going medical concerns.  He is back to baseline levels again. We will continue to encourage good  attendance and continue to montior his progress.       Expected Outcomes Short: Return to regular attendance Long: Continue to improve stamina                Discharge Exercise Prescription (Final Exercise Prescription Changes):  Exercise Prescription Changes - 12/26/21 1500       Response to Exercise   Blood Pressure (Admit) 122/72    Blood Pressure (Exit) 118/64    Heart Rate (Admit) 70 bpm    Heart Rate (Exercise) 86 bpm    Heart Rate (Exit)  84 bpm    Oxygen Saturation (Admit) 97 %    Oxygen Saturation (Exercise) 92 %    Oxygen Saturation (Exit) 97 %    Rating of Perceived Exertion (Exercise) 13    Perceived Dyspnea (Exercise) 2    Symptoms none    Duration Continue with 30 min of aerobic exercise without signs/symptoms of physical distress.    Intensity THRR unchanged      Progression   Progression Continue to progress workloads to maintain intensity without signs/symptoms of physical distress.    Average METs 3.13      Resistance Training   Training Prescription Yes    Weight 3 lb    Reps 10-15      Interval Training   Interval Training No      Treadmill   MPH 2.2    Grade 3    Minutes 15    METs 3.59      Recumbant Bike   Level 2    Minutes 15      NuStep   Level 1    Minutes 15    METs 2.8      Biostep-RELP   Level 2    Minutes 15    METs 3      Home Exercise Plan   Plans to continue exercise at Home (comment)   walking, staff vidoes   Frequency Add 2 additional days to program exercise sessions.    Initial Home Exercises Provided 08/29/21      Oxygen   Maintain Oxygen Saturation 88% or higher             Nutrition:  Target Goals: Understanding of nutrition guidelines, daily intake of sodium <1537m, cholesterol <2028m calories 30% from fat and 7% or less from saturated fats, daily to have 5 or more servings of fruits and vegetables.  Education: All About Nutrition: -Group instruction provided by verbal, written material,  interactive activities, discussions, models, and posters to present general guidelines for heart healthy nutrition including fat, fiber, MyPlate, the role of sodium in heart healthy nutrition, utilization of the nutrition label, and utilization of this knowledge for meal planning. Follow up email sent as well. Written material given at graduation. Flowsheet [facility]  Education need identified 07/19/21       Biometrics:  Pre Biometrics - 07/19/21 1441       Pre Biometrics   Height _0  (1.727 m)    Weight 191 lb 4.8 oz (86.8 kg)    BMI (Calculated) 29.09    Single Leg Stand 9.63 seconds             Post Biometrics - 01/04/22 1011        Post  Biometrics   Height _1  (1.727 m)    Weight 181 lb 3.2 oz (82.2 kg)    BMI (Calculated) 27.56             Nutrition Therapy Plan and Nutrition Goals:  Nutrition Therapy & Goals - 11/23/21 0959       Nutrition Therapy   RD appointment deferred Yes   Pt would still not like to meet RD at this time. Will continue to follow up.     Personal Nutrition Goals   Nutrition Goal Pt would still not like to meet RD at this time. Will continue to follow up.             Nutrition Assessments:  MEDIFICTS Score  Key: ?70 Need to make dietary changes  40-70 Heart Healthy Diet ? 40 Therapeutic Level Cholesterol Diet  Flowsheet Row Cardiac Rehab from 01/04/2022 in [facility] Cardiac and Pulmonary Rehab  Picture Your Plate Total Score on Discharge 60      Picture Your Plate Scores: <94 Unhealthy dietary pattern with much room for improvement. 41-50 Dietary pattern unlikely to meet recommendations for good health and room for improvement. 51-60 More healthful dietary pattern, with some room for improvement.  >60 Healthy dietary pattern, although there may be some specific behaviors that could be improved.    Nutrition Goals Re-Evaluation:  Nutrition Goals Re-Evaluation      [place] Name 08/29/21 1018 10/31/21 1020 12/21/21 0958         Goals   Nutrition Goal Continues to decline nurtition appt Continues to decline nutrition appt Pt would still not like to meet RD at this time. Will continue to follow up.     Comment Ulice Dash is doing well with his diet.  He keeps a close eye on his salt intake and aims for a good variety.  He is trying to get in lots of fruits and vegetables. -- Ulice Dash is doing well with his diet.  He keeps a close eye on his salt intake and aims for a good variety.  He is trying to get in lots of fruits and vegetables.     Expected Outcome Continue to focus on heart healthy diet -- Continue to focus on heart healthy diet              Nutrition Goals Discharge (Final Nutrition Goals Re-Evaluation):  Nutrition Goals Re-Evaluation - 12/21/21 0958       Goals   Nutrition Goal Pt would still not like to meet RD at this time. Will continue to follow up.    Comment Ulice Dash is doing well with his diet.  He keeps a close eye on his salt intake and aims for a good variety.  He is trying to get in lots of fruits and vegetables.    Expected Outcome Continue to focus on heart healthy diet             Psychosocial: Target Goals: Acknowledge presence or absence of significant depression and/or stress, maximize coping skills, provide positive support system. Participant is able to verbalize types and ability to use techniques and skills needed for reducing stress and depression.   Education: Stress, Anxiety, and Depression - Group verbal and visual presentation to define topics covered.  Reviews how body is impacted by stress, anxiety, and depression.  Also discusses healthy ways to reduce stress and to treat/manage anxiety and depression.  Written material given at graduation.   Education: Sleep Hygiene -Provides group verbal and written instruction about how sleep can affect your health.  Define sleep hygiene, discuss sleep cycles and impact of sleep habits.  Review good sleep hygiene tips.    Initial Review & Psychosocial Screening:  Initial Psych Review & Screening - 07/11/21 1036       Initial Review   Current issues with None Identified      Family Dynamics   Good Support System? Yes    Comments He can look to his wife and children for support. He has a positve outlook on his health.      Barriers   Psychosocial barriers to participate in program The patient should benefit from training in stress management and relaxation.;There are no identifiable barriers or psychosocial needs.  Screening Interventions   Interventions Encouraged to exercise;To provide support and resources with identified psychosocial needs;Program counselor consult;Provide feedback about the scores to participant    Expected Outcomes Short Term goal: Utilizing psychosocial counselor, staff and physician to assist with identification of specific Stressors or current issues interfering with healing process. Setting desired goal for each stressor or current issue identified.;Long Term Goal: Stressors or current issues are controlled or eliminated.;Short Term goal: Identification and review with participant of any Quality of Life or Depression concerns found by scoring the questionnaire.;Long Term goal: The participant improves quality of Life and PHQ9 Scores as seen by post scores and/or verbalization of changes             Quality of Life Scores:   Quality of Life - 01/04/22 1032       Quality of Life Scores   Health/Function Pre 25.37 %    Socioeconomic Pre 30 %    Psych/Spiritual Pre 30 %    Family Pre 30 %    GLOBAL Pre 28.01 %            Scores of 19 and below usually indicate a poorer quality of life in these areas.  A difference of  2-3 points is a clinically meaningful difference.  A difference of 2-3 points in the total score of the Quality of Life Index has been associated with significant improvement in overall quality of life, self-image,  physical symptoms, and general health in studies assessing change in quality of life.  PHQ-9: Review Flowsheet  More data exists      01/04/2022 11/10/2021 09/21/2021 07/19/2021 06/30/2021  Depression screen PHQ 2/9  Decreased Interest 0 0 0 0 0  Down, Depressed, Hopeless 0 0 0 0 0  PHQ - 2 Score 0 0 0 0 0  Altered sleeping 0 - - 1 -  Tired, decreased energy 3 - - 1 -  Change in appetite 0 - - 0 -  Feeling bad or failure about yourself  0 - - 0 -  Trouble concentrating 0 - - 0 -  Moving slowly or fidgety/restless 0 - - 0 -  Suicidal thoughts 0 - - 0 -  PHQ-9 Score 3 - - 2 -  Difficult doing work/chores Somewhat difficult - - Not difficult at all -   Interpretation of Total Score  Total Score Depression Severity:  1-4 = Minimal depression, 5-9 = Mild depression, 10-14 = Moderate depression, 15-19 = Moderately severe depression, 20-27 = Severe depression   Psychosocial Evaluation and Intervention:  Psychosocial Evaluation - 07/11/21 1037       Psychosocial Evaluation & Interventions   Interventions Encouraged to exercise with the program and follow exercise prescription;Relaxation education;Stress management education    Comments He can look to his wife and children for support. He has a positve outlook on his health.    Expected Outcomes Short: Start HeartTrack to help with mood. Long: Maintain a healthy mental state    Continue Psychosocial Services  Follow up required by staff             Psychosocial Re-Evaluation:  Psychosocial Re-Evaluation     [place] Name 08/15/21 1017 08/29/21 1017 10/31/21 1018 11/23/21 1006 12/21/21 0959     Psychosocial Re-Evaluation   Current issues with _0    Comments No new sleep or mental health/stress concerns reported. Ulice Dash is doing well in rehab.  He denies any major stressors currently and sleeps well  most days. He is enjoying coming to rehab and has really liked having  the seated equipment to get moving again as walking is a little more difficult for him. Ulice Dash is doing well despite him recovering from the rotavirus. He was recently discharged from the hospial. Overall, he states his sleep is great- he may wake up once during the night but can fall right back asleep. He is supposed to get cataract surgery in August as it has been delayed due to his other medical problems. Did inform him that he would need to wait at least 1 week before returning to rehab. He has good support at home ,esepcially his wife. He declines any problems with depression/anxiety. He has several follow up appts next couple of weeks he looks forward to going to. Ulice Dash is doing well mentally and reports no stress at this time. He enjoys going to baseball games, but has not gone recently due to shortness of breath. He relies on his wife and children for support - he has 7 grandchildren. He reports sleeping well and wakes up only about once during the night. Ulice Dash is doing well mentally and denies any major stressors at this time. Ulice Dash states that his sleep has been good, although he does wake up a few times a night to use the bathroom. He states that he has a good support system around him made up by his wife, kids, and grandkids. He enjoys going to his grandkids sporting events, but has not been able to recently due to his SOB.   Expected Outcomes Short: continue to attend cardiac rehab for mental health benefits related to exercise. Long: Maintain good mental health habits and sleep patterens. Short: Continue to exercise to stay loose and feel good Long: continue to stay positive Short: Continue good attendance with rehab and attend all follow up appointments with providers Long: Continue to maintain posititive attitude Short: Continue good attendance with rehab for mental health boost Long: Continue to maintain posititive attitude Short: Continue to attend rehab for stress relief. Long: Continue to maintain  posititive outlook.   Interventions Encouraged to attend Cardiac Rehabilitation for the exercise Encouraged to attend Cardiac Rehabilitation for the exercise Encouraged to attend Cardiac Rehabilitation for the exercise Encouraged to attend Cardiac Rehabilitation for the exercise Encouraged to attend Cardiac Rehabilitation for the exercise   Continue Psychosocial Services  Follow up required by staff -- Follow up required by staff Follow up required by staff Follow up required by staff            Psychosocial Discharge (Final Psychosocial Re-Evaluation):  Psychosocial Re-Evaluation - 12/21/21 0959       Psychosocial Re-Evaluation   Current issues with None Identified    Comments Ulice Dash is doing well mentally and denies any major stressors at this time. Ulice Dash states that his sleep has been good, although he does wake up a few times a night to use the bathroom. He states that he has a good support system around him made up by his wife, kids, and grandkids. He enjoys going to his grandkids sporting events, but has not been able to recently due to his SOB.    Expected Outcomes Short: Continue to attend rehab for stress relief. Long: Continue to maintain posititive outlook.    Interventions Encouraged to attend Cardiac Rehabilitation for the exercise    Continue Psychosocial Services  Follow up required by staff             Vocational Rehabilitation: Provide vocational rehab  assistance to qualifying candidates.   Vocational Rehab Evaluation & Intervention:   Education: Education Goals: Education classes will be provided on a variety of topics geared toward better understanding of heart health and risk factor modification. Participant will state understanding/return demonstration of topics presented as noted by education test scores.  Learning Barriers/Preferences:  Learning Barriers/Preferences - 07/11/21 1035       Learning Barriers/Preferences   Learning Barriers None    Learning  Preferences None             General Cardiac Education Topics:  AED/CPR: - Group verbal and written instruction with the use of models to demonstrate the basic use of the AED with the basic ABC's of resuscitation.   Anatomy and Cardiac Procedures: - Group verbal and visual presentation and models provide information about basic cardiac anatomy and function. Reviews the testing methods done to diagnose heart disease and the outcomes of the test results. Describes the treatment choices: Medical Management, Angioplasty, or Coronary Bypass Surgery for treating various heart conditions including Myocardial Infarction, Angina, Valve Disease, and Cardiac Arrhythmias.  Written material given at graduation.   Medication Safety: - Group verbal and visual instruction to review commonly prescribed medications for heart and lung disease. Reviews the medication, class of the drug, and side effects. Includes the steps to properly store meds and maintain the prescription regimen.  Written material given at graduation.   Intimacy: - Group verbal instruction through game format to discuss how heart and lung disease can affect sexual intimacy. Written material given at graduation..   Know Your Numbers and Heart Failure: - Group verbal and visual instruction to discuss disease risk factors for cardiac and pulmonary disease and treatment options.  Reviews associated critical values for Overweight/Obesity, Hypertension, Cholesterol, and Diabetes.  Discusses basics of heart failure: signs/symptoms and treatments.  Introduces Heart Failure Zone chart for action plan for heart failure.  Written material given at graduation.   Infection Prevention: - Provides verbal and written material to individual with discussion of infection control including proper hand washing and proper equipment cleaning during exercise session. Flowsheet Row Cardiac Rehab [facility]11/2021 [facility] Cardiac and Pulmonary Rehab  Date  07/11/21  Educator [facility]  Instruction Review Code 1- Verbalizes Understanding       Falls Prevention: - Provides verbal and written material to individual with discussion of falls prevention and safety. Flowsheet Row Cardiac Rehab from 07/11/2021 in [facility] Cardiac and Pulmonary Rehab  Date 07/11/21  Educator [facility]  Instruction Review Code 1- Verbalizes Understanding       Other: -Provides group and verbal instruction on various topics (see comments)   Knowledge Questionnaire Score:  Knowledge Questionnaire Score - 01/04/22 1031       Knowledge Questionnaire Score   Post Score 21/26: Exercise, Nutrition, Stress             Core Components/Risk Factors/Patient Goals at Admission:  Personal Goals and Risk Factors at Admission - 07/19/21 1531       Core Components/Risk Factors/Patient Goals on Admission    Weight Management Yes;Weight Loss    Intervention Weight Management: Develop a combined nutrition and exercise program designed to reach desired caloric intake, while maintaining appropriate intake of nutrient and fiber, sodium and fats, and appropriate energy expenditure required for the weight goal.;Weight Management: Provide education and appropriate resources to help participant work on and attain dietary goals.;Weight Management/Obesity: Establish reasonable short term and long term weight goals.    Admit Weight 191 lb (86.6 kg)  Goal Weight: Short Term 187 lb (84.8 kg)    Goal Weight: Long Term 180 lb (81.6 kg)    Expected Outcomes Short Term: Continue to assess and modify interventions until short term weight is achieved;Long Term: Adherence to nutrition and physical activity/exercise program aimed toward attainment of established weight goal;Weight Loss: Understanding of general recommendations for a balanced deficit meal plan, which promotes 1-2 lb weight loss per week and includes a negative energy balance of [phone] kcal/d;Understanding recommendations for meals to  include 15-35% energy as protein, 25-35% energy from fat, 35-60% energy from carbohydrates, less than 225m of dietary cholesterol, 20-35 gm of total fiber daily;Understanding of distribution of calorie intake throughout the day with the consumption of 4-5 meals/snacks    Diabetes Yes    Intervention Provide education about signs/symptoms and action to take for hypo/hyperglycemia.;Provide education about proper nutrition, including hydration, and aerobic/resistive exercise prescription along with prescribed medications to achieve blood glucose in normal ranges: Fasting glucose 65-99 mg/dL    Expected Outcomes Short Term: Participant verbalizes understanding of the signs/symptoms and immediate care of hyper/hypoglycemia, proper foot care and importance of medication, aerobic/resistive exercise and nutrition plan for blood glucose control.;Long Term: Attainment of HbA1C < 7%.    Heart Failure Yes    Intervention Provide a combined exercise and nutrition program that is supplemented with education, support and counseling about heart failure. Directed toward relieving symptoms such as shortness of breath, decreased exercise tolerance, and extremity edema.    Expected Outcomes Improve functional capacity of life;Short term: Attendance in program 2-3 days a week with increased exercise capacity. Reported lower sodium intake. Reported increased fruit and vegetable intake. Reports medication compliance.;Short term: Daily weights obtained and reported for increase. Utilizing diuretic protocols set by physician.;Long term: Adoption of self-care skills and reduction of barriers for early signs and symptoms recognition and intervention leading to self-care maintenance.    Hypertension Yes    Intervention Provide education on lifestyle modifcations including regular physical activity/exercise, weight management, moderate sodium restriction and increased consumption of fresh fruit, vegetables, and low fat dairy, alcohol  moderation, and smoking cessation.;Monitor prescription use compliance.    Expected Outcomes Short Term: Continued assessment and intervention until BP is < 140/929mHG in hypertensive participants. < 130/8050mG in hypertensive participants with diabetes, heart failure or chronic kidney disease.;Long Term: Maintenance of blood pressure at goal levels.    Lipids Yes    Intervention Provide education and support for participant on nutrition & aerobic/resistive exercise along with prescribed medications to achieve LDL <4m56mDL >40mg35m Expected Outcomes Short Term: Participant states understanding of desired cholesterol values and is compliant with medications prescribed. Participant is following exercise prescription and nutrition guidelines.;Long Term: Cholesterol controlled with medications as prescribed, with individualized exercise RX and with personalized nutrition plan. Value goals: LDL < 4mg,64m > 40 mg.             Education:Diabetes - Individual verbal and written instruction to review signs/symptoms of diabetes, desired ranges of glucose level fasting, after meals and with exercise. Acknowledge that pre and post exercise glucose checks will be done for 3 sessions at entry of program. [facility]  Date 07/11/21  [facility] Review Code 1- Verbalizes Understanding       Core Components/Risk Factors/Patient Goals Review:   Goals and Risk Factor Review     Row Name 08/15/21 1021 08/29/21 1019 10/31/21 1015 11/23/21 1000  12/21/21 1003     Core Components/Risk Factors/Patient Goals Review   Personal Goals Review Weight Management/Obesity;Heart Failure;Diabetes;Hypertension;Lipids Weight Management/Obesity;Heart Failure;Diabetes;Hypertension;Lipids Weight Management/Obesity;Heart Failure;Diabetes;Hypertension Weight Management/Obesity;Heart Failure;Diabetes;Hypertension Weight Management/Obesity;Heart  Failure;Diabetes;Hypertension   Review Patient reports montioring weight, blood pressure, and blood sugar at home. All levels were reported to be within normal range. Patient is taking all medications as prescribed. Weight has beeen consistent upon monitoring. Ulice Dash is doing well in rehab.  He watches his weight closely and will take extra lasix if it is up or if he notices any swelling or increased SOB.  He had a follow up last week with cardiology and got a good report.  His doctor is getting ready to leave so he will need a new cardiologist after July.  His pressures are doing well and he continues to check them at home.  He is good about checking his sugars each morning and it is usually aound 135 mg/dl each day.  He writes it down along with his pressures each day. Ulice Dash is recovering again from the hospital as he had rotavirus. His weight is down to 178-180 lb. He lost about 20 lbs from the hospital and is trying to maintain at this time. They changed his diuretic to torsemide and stays compliant taking it. He also checks his weight everyday and knows to report any abnormal weight gain for potential fluid issues. He sees his cardiologist on Wednesday for follow up and see Jackelyn Hoehn for heart failure follow up next week. Then, he sees PCP the week after that. He hopes for good reports. He is still checking  his blood sugars which range around 110-140 every morning. He states since being out of the hospital his BP at rehab has been on the lower end, declines symptoms but plans to bring attentiion to it as his follow up appts this week. He keep an ongoing log on his weight, BP Jay had lost weight when he was in the hospital recently, but has been stable since - he checks his weight daily, right now at 179lbs. He reports his MD cut down his metformin due to kidney issues; they offered him a medication in the form of a shot, but he could not afford it - his PCP is working to get him on a medication that would work  for him (seeing again on 12/08/21). His BG is running about 150 right now. He continues to check his BP daily - 124/78 today when he took it. He reports no heart failure symptoms at this time. He continues to take his medications as prescribed with no issues. Ulice Dash is comfortable with his weight at this time but would like to lose a few pounds. He checks his weight daily and states that he has been right around 180 lbs for some time now. He has been checking his blood sugars everyday and reports that they have been within normal ranges. He has also been checking his blood pressures regularly at home and states that they are staying around 124/90.   Expected Outcomes Short: continue to take all meds and monitor BP, blood sugar, and weight at home. Long: continue to control cardiac risk factors with heart healthy lifestyle. Short: Conitnue to work on weight loss Long: Conitnue to manage heart failure. Short: Talk to doctor about BP and weight, continue to watch closely at home Long: Manage heart failure and other risk factors Short: Talk to doctor about BG medication Long: Manage heart failure and other risk factors Short: Continue to  monitor BP and blood sugar levels. Long: Manage heart failure and other risk factors            Core Components/Risk Factors/Patient Goals at Discharge (Final Review):   Goals and Risk Factor Review - 12/21/21 1003       Core Components/Risk Factors/Patient Goals Review   Personal Goals Review Weight Management/Obesity;Heart Failure;Diabetes;Hypertension    Review Ulice Dash is comfortable with his weight at this time but would like to lose a few pounds. He checks his weight daily and states that he has been right around 180 lbs for some time now. He has been checking his blood sugars everyday and reports that they have been within normal ranges. He has also been checking his blood pressures regularly at home and states that they are staying around 124/90.    Expected Outcomes  Short: Continue to monitor BP and blood sugar levels. Long: Manage heart failure and other risk factors             ITP Comments:  ITP Comments     Row Name 07/11/21 1034 07/19/21 1431 08/01/21 1045 08/17/21 1414 09/14/21 0919   ITP Comments Virtual Visit completed. Patient informed on EP and RD appointment and 6 Minute walk test. Patient also informed of patient health questionnaires on My Chart. Patient Verbalizes understanding. Visit diagnosis can be found in [facility] 02/14/2021. Completed 6MWT and gym orientation. Initial ITP created and sent for review to Dr. Emily Filbert, Medical Director. First full day of exercise!  Patient was oriented to gym and equipment including functions, settings, policies, and procedures.  Patient's individual exercise prescription and treatment plan were reviewed.  All starting workloads were established based on the results of the 6 minute walk test done at initial orientation visit.  The plan for exercise progression was also introduced and progression will be customized based on patient's performance and goals. 30 Day review completed. Medical Director ITP review done, changes made as directed, and signed approval by Medical Director. 30 Day review completed. Medical Director ITP review done, changes made as directed, and signed approval by Medical Director.    [place] Name 10/03/21 1547 10/12/21 1351 11/09/21 1032 12/07/21 0957 12/12/21 1213   ITP Comments Ulice Dash has had intermitten attendance due to hospital admissions.  He was last here on 5/15 and 5/22.  He was admitted from 5/18-5/20.  Today, he called out with other appointments and has since been sent to ED for GI distress and extreme weight loss. We will continue to follow. 30 Day review completed. Medical Director ITP review done, changes made as directed, and signed approval by Medical Director. 30 Day review completed. Medical Director ITP review done, changes made as directed, and signed approval by Medical  Director. 30 Day review completed. Medical Director ITP review done, changes made as directed, and signed approval by Medical Director. Ulice Dash has been out for a little while due to having a kidney stone and in pain. Has not been here since 8/2. Will contiue to follow.    Row Name 01/04/22 1147           ITP Comments [place] graduated today from  rehab with 28 sessions completed.  Details of the patient's exercise prescription and what He needs to do in order to continue the prescription and progress were discussed with patient.  Patient was given a copy of prescription and goals.  Patient verbalized understanding.  Fowler plans to continue to exercise by exercising at home.  Comments: DISCHARGE ITP

## 2022-01-04 NOTE — Progress Notes (Signed)
Daily Session Note  Patient Details  Name: Jermaine Kramer MRN: [id number] Date of Birth: 09-08-1950 Referring Provider:   Flowsheet Row [facility] from 07/19/2021 in [facility]  Referring Provider Jermaine Amble MD       Encounter Date: 01/04/2022  Check In:  Session Check In - 01/04/22 1145       Check-In   Supervising physician immediately available to respond to emergencies See telemetry face sheet for immediately available ER MD    Location [facility]    Staff Present Jermaine Lark, RN, BSN, CCRP;Jermaine Kramer, RCP,RRT,BSRT;Jermaine [facility], [place], Exercise Physiologist    Virtual Visit No    Medication changes reported     No    Fall or balance concerns reported    No    Warm-up and Cool-down Performed on first and last piece of equipment    Resistance Training Performed Yes    VAD Patient? No    PAD/SET Patient? No      Pain Assessment   Currently in Pain? No/denies                Social History   Tobacco Use  Smoking Status Never  Smokeless Tobacco Never    Goals Met:  Independence with exercise equipment Exercise tolerated well No report of concerns or symptoms today  Goals Unmet:  Not Applicable  Comments:  Jermaine Kramer graduated today from  [facility] with 28 sessions completed.  Details of the patient's exercise prescription and what He needs to do in order to continue the prescription and progress were discussed with patient.  Patient was given a copy of prescription and goals.  Patient verbalized understanding.  Jermaine Kramer plans to continue to exercise by exercising at home.    Jermaine Kramer is Medical Director for [facility].  Jermaine Kramer is Medical Director for [facility].

## 2022-01-04 NOTE — Progress Notes (Signed)
DIscharged

## 2022-01-04 NOTE — Progress Notes (Signed)
Discharge Note:  Jermaine Kramer   DOB:11-28-1950  Jermaine Kramer graduated today from  rehab with 28 sessions completed.  Details of the patient's exercise prescription and what He needs to do in order to continue the prescription and progress were discussed with patient.  Patient was given a copy of prescription and goals.  Patient verbalized understanding.  Jermaine Kramer plans to continue to exercise by exercising at home.   [place] Name 07/19/21 1458 01/04/22 1010       6 Minute Walk   Phase Initial Discharge    Distance 1225 feet 1200 feet    Distance % Change -- -2 %    Distance Feet Change -- -25 ft    Walk Time 6 minutes 6 minutes    # of Rest Breaks 0 0    MPH 2.32 2.27    METS 2.76 2.5    RPE 11 13    Perceived Dyspnea  1 1    VO2 Peak 9.66 8.74    Symptoms Yes (comment) Yes (comment)    Comments SOB, legs fatigued SOB, Hip Pain (2/10)    Resting HR 75 bpm 70 bpm    Resting BP 104/64 108/66    Resting Oxygen Saturation  97 % 96 %    Exercise Oxygen Saturation  during 6 min walk 95 % 94 %    Max Ex. HR 111 bpm 106 bpm    Max Ex. BP 140/68 114/64    2 Minute Post BP 108/68 --            Thank yo for the referral.

## 2022-01-09 ENCOUNTER — Ambulatory Visit: Payer: Medicare Other | Admitting: Family

## 2022-01-09 NOTE — Progress Notes (Unsigned)
Patient ID: Jermaine Kramer, male    DOB: 08-25-1950, 71 y.o.   MRN: [id number]  HPI  Mr Donigan is a 71 y/o male with a history of leukemia, DM, CAD (CABG 02/16/21), AF, sleep apnea, hyperlipidemia, HTN, stroke, GERD & chronic heart failure.   Echo report from 09/16/21 reviewed and showed an EF of 30-35% along with mild MR. Echo report from 05/16/21 reviewed and showed and EF of <20% along with mild/moderate LAE and mild/moderate MR.   Was in the ED 12/06/21 due to LLQ abdominal pain along with watery bowel movements. CT of the abdomen and pelvis without IV contrast shows a moderate amount of stool throughout the colon as well as nonobstructing left renal calculi with no evidence of ureteral calculus or hydronephrosis. Was in the ED 12/02/21 due to LLQ abdominal pain. CT imaging without any evidence of obstruction or ileus. No signs of UTI. Symptoms improved and he was released. Admitted 10/04/21 due to severe nausea diarrhea and poor oral intake. IVF hydration gently done. Hypokalemia corrected. Able to keep PO fluids done and he was released. Discharged after 3 days. Was in the ED 10/03/21 due to nausea, vomiting and diarrhea along with abdominal pain. IVF given. Abdominal CT negative. + for rotavirus. Felt better and he was released. Admitted 09/15/21 due to worsening SOB and weight gain due to acute on chronic HF. Given IV lasix with transition to oral diuretics. Cardiology consult obtained. ACEi deferred to renal function. Discharged after 2 days. Admitted 07/24/21 due to  worsening left flank pain today, which is constant, sharp, 8 out of 10 in severity, nonradiating. CT of lumbar spine is negative for acute injury, but showed degenerative disc disease.  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 Urology and GI consults obtained. Discharged after 3 days. Was in the ED 07/21/21 due to left sided flank pain. CT as positive for a kidney stone that is in the left kidney,  nonobstructing. He was released with pain medication. AICD implanted 06/22/21.   He presents today for a follow-up visit with a chief complaint of moderate fatigue with minimal exertion. Describes this as chronic in nature having been present for several years. He has associated cough, shortness of breath, difficulty sleeping, weakness and occasional abdominal pain along with this. He denies any dizziness, abdominal distention, palpitations, pedal edema or chest pain.  He has finished cardiac rehab and is trying to do some walking at home when it's not too hot/humid. Says that his weight has been steady over the last 5-6 weeks.   Since he was last here, he has only taken 1 extra diuretic and hasn't used furoscix.   Has upcoming cataract surgery on 03/21/22 and 04/05/22  Past Medical History:  Diagnosis Date   Arrhythmia    atrial fibrillation   CHF (congestive heart failure) (HCC)    Coronary artery disease    Diabetes mellitus without complication (HCC)    GERD (gastroesophageal reflux disease)    Hypercholesteremia    Hypertension    Sleep apnea    Stroke [facility])    Past Surgical History:  Procedure Laterality Date   CHOLECYSTECTOMY     COLONOSCOPY WITH PROPOFOL N/A 04/14/2019   Procedure: COLONOSCOPY WITH PROPOFOL;  Surgeon: Toledo, Benay Pike, MD;  Location: [facility] ENDOSCOPY;  Service: Gastroenterology;  Laterality: N/A;   CORONARY ARTERY BYPASS GRAFT  02/16/2021   ICD IMPLANT N/A 06/22/2021   Procedure: ICD IMPLANT;  Surgeon: Vickie Epley, MD;  Location: [place] CV LAB;  Service: Cardiovascular;  Laterality: N/A;   KNEE ARTHROSCOPY     RENAL CYST EXCISION     Family History  Problem Relation Age of Onset   Parkinson's disease Mother    Lupus Mother    Heart disease Father    Social History   Tobacco Use   Smoking status: Never   Smokeless tobacco: Never  Substance Use Topics   Alcohol use: Not Currently   Allergies  Allergen Reactions   Doxycycline  Anaphylaxis    Patient does not recall having this reaction.     Heparin Other (See Comments)    heparin-induced thrombocytopenia    Empagliflozin Itching    Groin infection Caused a groin infection.     Prior to Admission medications   Medication Sig Start Date End Date Taking? Authorizing Provider  amiodarone (PACERONE) 200 MG tablet Take 200 mg by mouth daily. 03/06/21  Yes [provider]  apixaban (ELIQUIS) 5 MG TABS tablet Take 1 tablet (5 mg total) by mouth 2 (two) times daily. 11/28/21  Yes Darylene Price A, FNP  aspirin EC 81 MG tablet Take 81 mg by mouth daily.   Yes [provider]  atorvastatin (LIPITOR) 80 MG tablet Take 80 mg by mouth daily. 02/10/21  Yes [provider]  glucose blood (PRECISION QID TEST) test strip Use 3 (three) times daily Use as instructed. One touch ultra 07/05/21 07/05/22 Yes [provider]  metFORMIN (GLUCOPHAGE) 1000 MG tablet Take 500 mg by mouth 2 (two) times daily with a meal.   Yes [provider]  metoprolol succinate (TOPROL-XL) 25 MG 24 hr tablet Take 0.5 tablets (12.5 mg total) by mouth every evening. 10/11/21  Yes Uriel Horkey, Otila Kluver A, FNP  omeprazole (PRILOSEC) 40 MG capsule Take 40 mg by mouth 2 (two) times daily. 03/11/21  Yes [provider]  ondansetron (ZOFRAN) 4 MG tablet Take 4 mg by mouth every 8 (eight) hours as needed. 09/13/21  Yes [provider]  spironolactone (ALDACTONE) 25 MG tablet Take 12.5 mg by mouth daily. 09/20/21  Yes [provider]  tamsulosin (FLOMAX) 0.4 MG CAPS capsule Take 0.8 mg by mouth in the morning and at bedtime.   Yes [provider]  torsemide (DEMADEX) 20 MG tablet Take 1 tablet (20 mg total) by mouth daily. And additional '20mg'$  if needed 09/30/21  Yes Darylene Price A, FNP  bisacodyl (DULCOLAX) 5 MG EC tablet Take 1 tablet (5 mg total) by mouth daily as needed for moderate constipation. Patient not taking: Reported on 01/10/2022 12/06/21 12/06/22   Naaman Plummer, MD  lactulose (CHRONULAC) 10 GM/15ML solution Take 30 mLs (20 g total) by mouth 2 (two) times daily as needed for mild constipation. Patient not taking: Reported on 01/10/2022 12/02/21   Merlyn Lot, MD   Review of Systems  Constitutional:  Positive for fatigue (easily). Negative for appetite change.  HENT:  Negative for congestion, postnasal drip and sore throat.   Eyes: Negative.   Respiratory:  Positive for cough and shortness of breath (easily). Negative for chest tightness and wheezing.   Cardiovascular:  Negative for chest pain, palpitations and leg swelling.  Gastrointestinal:  Positive for abdominal pain (sometimes). Negative for abdominal distention.  Endocrine: Negative.   Genitourinary: Negative.   Musculoskeletal:  Negative for back pain and neck pain.  Skin: Negative.   Allergic/Immunologic: Negative.   Neurological:  Positive for weakness. Negative for dizziness and light-headedness.  Hematological:  Negative for adenopathy.  Does not bruise/bleed easily.  Psychiatric/Behavioral:  Positive for sleep disturbance (sleeping on 2 pillows waking up frequently due urination). Negative for dysphoric mood. The patient is not nervous/anxious.    Vitals:   01/10/22 1137  BP: 105/70  Pulse: 70  Resp: 20  SpO2: 100%  Weight: 183 lb 4 oz (83.1 kg)  Height: '5\' 5"'$  (1.651 m)   Wt Readings from Last 3 Encounters:  01/10/22 183 lb 4 oz (83.1 kg)  01/04/22 181 lb 3.2 oz (82.2 kg)  11/28/21 181 lb 4 oz (82.2 kg)   Lab Results  Component Value Date   CREATININE 2.10 (H) 12/06/2021   CREATININE 1.88 (H) 12/02/2021   CREATININE 1.87 (H) 11/10/2021   Physical Exam Vitals and nursing note reviewed. Exam conducted with a chaperone present (wife).  Constitutional:      Appearance: Normal appearance.  HENT:     Head: Normocephalic and atraumatic.  Cardiovascular:     Rate and Rhythm: Normal rate and regular rhythm.  Pulmonary:     Effort: Pulmonary effort is  normal. No respiratory distress.     Breath sounds: No wheezing or rales.  Abdominal:     General: There is no distension.     Palpations: Abdomen is soft.     Tenderness: There is no abdominal tenderness.  Musculoskeletal:        General: No tenderness.     Cervical back: Normal range of motion and neck supple.     Right lower leg: No edema.     Left lower leg: No edema.  Skin:    General: Skin is warm and dry.  Neurological:     General: No focal deficit present.     Mental Status: He is alert and oriented to person, place, and time.  Psychiatric:        Mood and Affect: Mood normal.        Behavior: Behavior normal.        Thought Content: Thought content normal.   Assessment & Plan:  1: Chronic heart failure with reduced ejection fraction- - NYHA class III - euvolemic today - weighing daily; reminded to call for an overnight weight gain of > 2 pounds or a weekly weight gain of >5 pounds - weight up 2 pounds from last visit here 5 weeks ago  - not adding "much" salt and has been reading food labels for sodium content; understands to keep daily sodium intake to '2000mg'$  / day - on GDMT of metoprolol and spironolactone; lisinopril still on hold - current BP will not allow to resume lisinopril - allergic to empagliflozin with a yeast infection - saw EP Quentin Ore) 09/21/21  - had AICD implanted 06/22/21; no shocks have been delivered - BNP 11/03/21 was 1457.3  2: HTN- - BP looks good (105/70) - saw PCP [name]) 12/09/21 - BMP 12/09/21 reviewed and showed sodium 139, potassium 4.2, creatinine 1.9 & GFR 37  3: DM- - A1c 11/10/21 was 6.9% - glucose at home today was 145 - taking metformin  4: Atrial fibrillation- - saw cardiology Corky Sox) 11/03/21 - currently on amiodarone, metoprolol and apixaban  5: CAD- - CABG with a LIMA to LAD, SVG to ramus, and SVG to PDA on 02/16/21 - finished cardiac rehab; encouraged to continue walking when it's not hot/humid outside  6: Abdominal  pain- - had 2 ED visits for this last month (August) - no longer taking biascodyl or lactulose and says that he has intermittent abdominal pain   Medication list  reviewed.   Return in 3 months, sooner if needed.

## 2022-01-10 ENCOUNTER — Ambulatory Visit: Payer: Medicare Other | Attending: Family | Admitting: Family

## 2022-01-10 ENCOUNTER — Ambulatory Visit: Payer: Medicare Other | Admitting: Family

## 2022-01-10 ENCOUNTER — Encounter: Payer: Self-pay | Admitting: Family

## 2022-01-10 VITALS — BP 105/70 | HR 70 | Resp 20 | Ht 65.0 in | Wt 183.2 lb

## 2022-01-10 DIAGNOSIS — I255 Ischemic cardiomyopathy: Secondary | ICD-10-CM

## 2022-01-10 DIAGNOSIS — Z95810 Presence of automatic (implantable) cardiac defibrillator: Secondary | ICD-10-CM | POA: Insufficient documentation

## 2022-01-10 DIAGNOSIS — I480 Paroxysmal atrial fibrillation: Secondary | ICD-10-CM | POA: Diagnosis not present

## 2022-01-10 DIAGNOSIS — Z8673 Personal history of transient ischemic attack (TIA), and cerebral infarction without residual deficits: Secondary | ICD-10-CM | POA: Diagnosis not present

## 2022-01-10 DIAGNOSIS — I4891 Unspecified atrial fibrillation: Secondary | ICD-10-CM | POA: Insufficient documentation

## 2022-01-10 DIAGNOSIS — I110 Hypertensive heart disease with heart failure: Secondary | ICD-10-CM | POA: Insufficient documentation

## 2022-01-10 DIAGNOSIS — Z951 Presence of aortocoronary bypass graft: Secondary | ICD-10-CM | POA: Diagnosis not present

## 2022-01-10 DIAGNOSIS — K219 Gastro-esophageal reflux disease without esophagitis: Secondary | ICD-10-CM | POA: Diagnosis not present

## 2022-01-10 DIAGNOSIS — I2510 Atherosclerotic heart disease of native coronary artery without angina pectoris: Secondary | ICD-10-CM | POA: Insufficient documentation

## 2022-01-10 DIAGNOSIS — Z7984 Long term (current) use of oral hypoglycemic drugs: Secondary | ICD-10-CM | POA: Diagnosis not present

## 2022-01-10 DIAGNOSIS — E119 Type 2 diabetes mellitus without complications: Secondary | ICD-10-CM | POA: Diagnosis not present

## 2022-01-10 DIAGNOSIS — I10 Essential (primary) hypertension: Secondary | ICD-10-CM | POA: Diagnosis not present

## 2022-01-10 DIAGNOSIS — I5022 Chronic systolic (congestive) heart failure: Secondary | ICD-10-CM | POA: Diagnosis present

## 2022-01-10 DIAGNOSIS — E1122 Type 2 diabetes mellitus with diabetic chronic kidney disease: Secondary | ICD-10-CM | POA: Diagnosis not present

## 2022-01-10 DIAGNOSIS — G4730 Sleep apnea, unspecified: Secondary | ICD-10-CM | POA: Insufficient documentation

## 2022-01-10 DIAGNOSIS — R1032 Left lower quadrant pain: Secondary | ICD-10-CM

## 2022-01-10 DIAGNOSIS — Z7901 Long term (current) use of anticoagulants: Secondary | ICD-10-CM | POA: Insufficient documentation

## 2022-01-10 DIAGNOSIS — N1832 Chronic kidney disease, stage 3b: Secondary | ICD-10-CM

## 2022-01-10 MED ORDER — ATORVASTATIN CALCIUM 80 MG PO TABS: 80.0000 mg | ORAL_TABLET | Freq: Every day | ORAL | 5 refills | Status: DC

## 2022-01-10 NOTE — Patient Instructions (Signed)
Continue weighing daily and call for an overnight weight gain of 3 pounds or more or a weekly weight gain of more than 5 pounds  If you have voicemail, please make sure your mailbox is cleaned out so that we may leave a message and please make sure to listen to any voicemails.    If you receive a satisfaction survey regarding the [facility], please take the time to fill it out. This way we can continue to provide excellent care and make any changes that need to be made.     

## 2022-01-17 NOTE — Progress Notes (Signed)
Remote ICD transmission.   

## 2022-01-23 ENCOUNTER — Telehealth: Payer: Self-pay | Admitting: Family

## 2022-01-23 NOTE — Progress Notes (Unsigned)
Patient ID: Jermaine Kramer, male    DOB: 1951-04-16, 71 y.o.   MRN: [id number]  HPI  Jermaine Kramer is a 71 y/o male with a history of leukemia, DM, CAD (CABG 02/16/21), AF, sleep apnea, hyperlipidemia, HTN, stroke, GERD & chronic heart failure.   Echo report from 09/16/21 reviewed and showed an EF of 30-35% along with mild Jermaine. Echo report from 05/16/21 reviewed and showed and EF of <20% along with mild/moderate LAE and mild/moderate Jermaine.   Was in the ED 12/06/21 due to LLQ abdominal pain along with watery bowel movements. CT of the abdomen and pelvis without IV contrast shows a moderate amount of stool throughout the colon as well as nonobstructing left renal calculi with no evidence of ureteral calculus or hydronephrosis. Was in the ED 12/02/21 due to LLQ abdominal pain. CT imaging without any evidence of obstruction or ileus. No signs of UTI. Symptoms improved and he was released. Admitted 10/04/21 due to severe nausea diarrhea and poor oral intake. IVF hydration gently done. Hypokalemia corrected. Able to keep PO fluids done and he was released. Discharged after 3 days. Was in the ED 10/03/21 due to nausea, vomiting and diarrhea along with abdominal pain. IVF given. Abdominal CT negative. + for rotavirus. Felt better and he was released. Admitted 09/15/21 due to worsening SOB and weight gain due to acute on chronic HF. Given IV lasix with transition to oral diuretics. Cardiology consult obtained. ACEi deferred to renal function. Discharged after 2 days. Admitted 07/24/21 due to  worsening left flank pain today, which is constant, sharp, 8 out of 10 in severity, nonradiating. CT of lumbar spine is negative for acute injury, but showed degenerative disc disease.  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 Urology and GI consults obtained. Discharged after 3 days. Was in the ED 07/21/21 due to left sided flank pain. CT as positive for a kidney stone that is in the left kidney,  nonobstructing. He was released with pain medication. AICD implanted 06/22/21.   He presents today for an acute visit with a chief complaint of  He used furoscix on 01/22/22 due to weight gain and abdominal distention.   Has upcoming cataract surgery on 03/21/22 and 04/05/22  Past Medical History:  Diagnosis Date   Arrhythmia    atrial fibrillation   CHF (congestive heart failure) (HCC)    Coronary artery disease    Diabetes mellitus without complication (HCC)    GERD (gastroesophageal reflux disease)    Hypercholesteremia    Hypertension    Sleep apnea    Stroke [facility])    Past Surgical History:  Procedure Laterality Date   CHOLECYSTECTOMY     COLONOSCOPY WITH PROPOFOL N/A 04/14/2019   Procedure: COLONOSCOPY WITH PROPOFOL;  Surgeon: Toledo, Benay Pike, MD;  Location: [facility] ENDOSCOPY;  Service: Gastroenterology;  Laterality: N/A;   CORONARY ARTERY BYPASS GRAFT  02/16/2021   ICD IMPLANT N/A 06/22/2021   Procedure: ICD IMPLANT;  Surgeon: Vickie Epley, MD;  Location: [place] [facility];  Service: Cardiovascular;  Laterality: N/A;   KNEE ARTHROSCOPY     RENAL CYST EXCISION     Family History  Problem Relation Age of Onset   Parkinson's disease Mother    Lupus Mother    Heart disease Father    Social History   Tobacco Use   Smoking status: Never   Smokeless tobacco: Never  Substance Use Topics   Alcohol use: Not Currently   Allergies  Allergen Reactions   Doxycycline Anaphylaxis    Patient does not recall having this reaction.     Heparin Other (See Comments)    heparin-induced thrombocytopenia    Empagliflozin Itching    Groin infection Caused a groin infection.      Review of Systems  Constitutional:  Positive for fatigue (easily). Negative for appetite change.  HENT:  Negative for congestion, postnasal drip and sore throat.   Eyes: Negative.   Respiratory:  Positive for cough and shortness of breath (easily). Negative for chest tightness and wheezing.    Cardiovascular:  Negative for chest pain, palpitations and leg swelling.  Gastrointestinal:  Positive for abdominal pain (sometimes). Negative for abdominal distention.  Endocrine: Negative.   Genitourinary: Negative.   Musculoskeletal:  Negative for back pain and neck pain.  Skin: Negative.   Allergic/Immunologic: Negative.   Neurological:  Positive for weakness. Negative for dizziness and light-headedness.  Hematological:  Negative for adenopathy. Does not bruise/bleed easily.  Psychiatric/Behavioral:  Positive for sleep disturbance (sleeping on 2 pillows waking up frequently due urination). Negative for dysphoric mood. The patient is not nervous/anxious.      Physical Exam Vitals and nursing note reviewed. Exam conducted with a chaperone present (wife).  Constitutional:      Appearance: Normal appearance.  HENT:     Head: Normocephalic and atraumatic.  Cardiovascular:     Rate and Rhythm: Normal rate and regular rhythm.  Pulmonary:     Effort: Pulmonary effort is normal. No respiratory distress.     Breath sounds: No wheezing or rales.  Abdominal:     General: There is no distension.     Palpations: Abdomen is soft.     Tenderness: There is no abdominal tenderness.  Musculoskeletal:        General: No tenderness.     Cervical back: Normal range of motion and neck supple.     Right lower leg: No edema.     Left lower leg: No edema.  Skin:    General: Skin is warm and dry.  Neurological:     General: No focal deficit present.     Mental Status: He is alert and oriented to person, place, and time.  Psychiatric:        Mood and Affect: Mood normal.        Behavior: Behavior normal.        Thought Content: Thought content normal.   Assessment & Plan:  1: Chronic heart failure with reduced ejection fraction- - NYHA class III - euvolemic today - weighing daily; reminded to call for an overnight weight gain of > 2 pounds or a weekly weight gain of >5 pounds - weight  183.4 pounds from last visit here 2 weeks ago  - not adding "much" salt and has been reading food labels for sodium content; understands to keep daily sodium intake to '2000mg'$  / day - on GDMT of metoprolol and spironolactone; lisinopril still on hold - current BP will not allow to resume lisinopril - allergic to empagliflozin with a yeast infection - saw EP Quentin Ore) 09/21/21  - had AICD implanted 06/22/21; no shocks have been delivered - BNP 11/03/21 was 1457.3  2: HTN- - BP - saw PCP [name]) 01/13/22 - BMP 01/13/22 reviewed and showed sodium 139, potassium 4.5, creatinine 2.1 & GFR 33  3: DM- - A1c 11/10/21 was 6.9% - glucose at home today was  - taking metformin  4: Atrial fibrillation- - saw cardiology Corky Sox) 11/03/21 - currently on  amiodarone, metoprolol and apixaban  5: CAD- - CABG with a LIMA to LAD, SVG to ramus, and SVG to PDA on 02/16/21 - finished cardiac rehab; encouraged to continue walking when it's not hot/humid outside  6: Abdominal pain- - had 2 ED visits for this last month (August) - no longer taking biascodyl or lactulose and says that he has intermittent abdominal pain   Medication list reviewed.

## 2022-01-23 NOTE — Telephone Encounter (Signed)
Spoke with patient's wife, Jermaine Kramer, regarding weight gain and diuretic usage over the weekend. She said that on 01/19/22, she noticed that his abdomen was larger so he took an extra '20mg'$  torsemide.   Weights were: 9/22 he was 179 pounds 9/23 he was 182 9/24 he was 184 9/25 (today) he was 181  He did use furoscix injection device yesterday (9/24) and weight today is down 3 pounds. No change in diet or fluid intake.   Will see patient in the office tomorrow and check labs at that time. She confirmed appointment time.

## 2022-01-24 ENCOUNTER — Encounter: Payer: Self-pay | Admitting: Family

## 2022-01-24 ENCOUNTER — Telehealth: Payer: Self-pay | Admitting: Family

## 2022-01-24 ENCOUNTER — Ambulatory Visit (HOSPITAL_BASED_OUTPATIENT_CLINIC_OR_DEPARTMENT_OTHER): Payer: Medicare Other | Admitting: Family

## 2022-01-24 ENCOUNTER — Other Ambulatory Visit
Admission: RE | Admit: 2022-01-24 | Discharge: 2022-01-24 | Disposition: A | Discharge status: Home / Self Care | Payer: Medicare Other | Source: Ambulatory Visit | Attending: Family | Admitting: Family

## 2022-01-24 VITALS — BP 125/78 | HR 70 | Resp 18 | Ht 65.0 in | Wt 185.0 lb

## 2022-01-24 DIAGNOSIS — E1122 Type 2 diabetes mellitus with diabetic chronic kidney disease: Secondary | ICD-10-CM | POA: Diagnosis not present

## 2022-01-24 DIAGNOSIS — Z95810 Presence of automatic (implantable) cardiac defibrillator: Secondary | ICD-10-CM | POA: Insufficient documentation

## 2022-01-24 DIAGNOSIS — R1032 Left lower quadrant pain: Secondary | ICD-10-CM | POA: Insufficient documentation

## 2022-01-24 DIAGNOSIS — N200 Calculus of kidney: Secondary | ICD-10-CM | POA: Insufficient documentation

## 2022-01-24 DIAGNOSIS — Z8673 Personal history of transient ischemic attack (TIA), and cerebral infarction without residual deficits: Secondary | ICD-10-CM | POA: Insufficient documentation

## 2022-01-24 DIAGNOSIS — K219 Gastro-esophageal reflux disease without esophagitis: Secondary | ICD-10-CM | POA: Insufficient documentation

## 2022-01-24 DIAGNOSIS — R109 Unspecified abdominal pain: Secondary | ICD-10-CM | POA: Insufficient documentation

## 2022-01-24 DIAGNOSIS — E785 Hyperlipidemia, unspecified: Secondary | ICD-10-CM | POA: Insufficient documentation

## 2022-01-24 DIAGNOSIS — I480 Paroxysmal atrial fibrillation: Secondary | ICD-10-CM | POA: Diagnosis not present

## 2022-01-24 DIAGNOSIS — I4891 Unspecified atrial fibrillation: Secondary | ICD-10-CM | POA: Insufficient documentation

## 2022-01-24 DIAGNOSIS — E119 Type 2 diabetes mellitus without complications: Secondary | ICD-10-CM | POA: Insufficient documentation

## 2022-01-24 DIAGNOSIS — I10 Essential (primary) hypertension: Secondary | ICD-10-CM

## 2022-01-24 DIAGNOSIS — I110 Hypertensive heart disease with heart failure: Secondary | ICD-10-CM | POA: Insufficient documentation

## 2022-01-24 DIAGNOSIS — Z951 Presence of aortocoronary bypass graft: Secondary | ICD-10-CM | POA: Insufficient documentation

## 2022-01-24 DIAGNOSIS — G4730 Sleep apnea, unspecified: Secondary | ICD-10-CM | POA: Insufficient documentation

## 2022-01-24 DIAGNOSIS — E876 Hypokalemia: Secondary | ICD-10-CM | POA: Insufficient documentation

## 2022-01-24 DIAGNOSIS — I255 Ischemic cardiomyopathy: Secondary | ICD-10-CM

## 2022-01-24 DIAGNOSIS — I509 Heart failure, unspecified: Secondary | ICD-10-CM | POA: Insufficient documentation

## 2022-01-24 DIAGNOSIS — R197 Diarrhea, unspecified: Secondary | ICD-10-CM | POA: Insufficient documentation

## 2022-01-24 DIAGNOSIS — I2510 Atherosclerotic heart disease of native coronary artery without angina pectoris: Secondary | ICD-10-CM

## 2022-01-24 DIAGNOSIS — Z856 Personal history of leukemia: Secondary | ICD-10-CM | POA: Insufficient documentation

## 2022-01-24 DIAGNOSIS — I5022 Chronic systolic (congestive) heart failure: Secondary | ICD-10-CM

## 2022-01-24 DIAGNOSIS — N1832 Chronic kidney disease, stage 3b: Secondary | ICD-10-CM

## 2022-01-24 LAB — BASIC METABOLIC PANEL
Anion gap: 14 (ref 5–15)
BUN: 35 mg/dL — ABNORMAL HIGH (ref 8–23)
CO2: 21 mmol/L — ABNORMAL LOW (ref 22–32)
Calcium: 9.2 mg/dL (ref 8.9–10.3)
Chloride: 105 mmol/L (ref 98–111)
Creatinine, Ser: 2.03 mg/dL — ABNORMAL HIGH (ref 0.61–1.24)
GFR, Estimated: 34 mL/min — ABNORMAL LOW (ref >60)
Glucose, Bld: 151 mg/dL — ABNORMAL HIGH (ref 70–99)
Potassium: 4.6 mmol/L (ref 3.5–5.1)
Sodium: 140 mmol/L (ref 135–145)

## 2022-01-24 LAB — BRAIN NATRIURETIC PEPTIDE: B Natriuretic Peptide: 1238.8 pg/mL — ABNORMAL HIGH (ref 0.0–100.0)

## 2022-01-24 MED ORDER — LOSARTAN POTASSIUM 25 MG PO TABS: 12.5000 mg | ORAL_TABLET | Freq: Every day | ORAL | 3 refills | Status: DC

## 2022-01-24 NOTE — Telephone Encounter (Signed)
Spoke with patient's wife, Butch Penny, regarding BMP/ BNP results obtained earlier today. BNP is trending down and renal function is slightly better.   Will add low dose 12.'5mg'$  losartan daily and see if his BP can tolerate this. Hope to get some renal protection from this as well. Will see patient back in the office next week and recheck labs at that time.   Butch Penny verbalized understanding.

## 2022-01-24 NOTE — Patient Instructions (Addendum)
Continue weighing daily and call for an overnight weight gain of 3 pounds or more or a weekly weight gain of more than 5 pounds.   If you have voicemail, please make sure your mailbox is cleaned out so that we may leave a message and please make sure to listen to any voicemails.     

## 2022-01-31 ENCOUNTER — Encounter: Payer: Self-pay | Admitting: Family

## 2022-01-31 ENCOUNTER — Other Ambulatory Visit
Admission: RE | Admit: 2022-01-31 | Discharge: 2022-01-31 | Disposition: A | Discharge status: Home / Self Care | Payer: Medicare Other | Source: Ambulatory Visit | Attending: Family | Admitting: Family

## 2022-01-31 ENCOUNTER — Ambulatory Visit: Payer: Medicare Other | Attending: Family | Admitting: Family

## 2022-01-31 ENCOUNTER — Telehealth: Payer: Self-pay

## 2022-01-31 VITALS — BP 120/86 | HR 70 | Resp 16 | Ht 65.0 in | Wt 188.0 lb

## 2022-01-31 DIAGNOSIS — K219 Gastro-esophageal reflux disease without esophagitis: Secondary | ICD-10-CM | POA: Insufficient documentation

## 2022-01-31 DIAGNOSIS — Z7901 Long term (current) use of anticoagulants: Secondary | ICD-10-CM | POA: Diagnosis not present

## 2022-01-31 DIAGNOSIS — I5022 Chronic systolic (congestive) heart failure: Secondary | ICD-10-CM | POA: Insufficient documentation

## 2022-01-31 DIAGNOSIS — I4891 Unspecified atrial fibrillation: Secondary | ICD-10-CM | POA: Diagnosis not present

## 2022-01-31 DIAGNOSIS — N1832 Chronic kidney disease, stage 3b: Secondary | ICD-10-CM

## 2022-01-31 DIAGNOSIS — I2510 Atherosclerotic heart disease of native coronary artery without angina pectoris: Secondary | ICD-10-CM | POA: Insufficient documentation

## 2022-01-31 DIAGNOSIS — Z8673 Personal history of transient ischemic attack (TIA), and cerebral infarction without residual deficits: Secondary | ICD-10-CM | POA: Diagnosis not present

## 2022-01-31 DIAGNOSIS — I10 Essential (primary) hypertension: Secondary | ICD-10-CM | POA: Diagnosis not present

## 2022-01-31 DIAGNOSIS — I130 Hypertensive heart and chronic kidney disease with heart failure and stage 1 through stage 4 chronic kidney disease, or unspecified chronic kidney disease: Secondary | ICD-10-CM | POA: Diagnosis present

## 2022-01-31 DIAGNOSIS — I255 Ischemic cardiomyopathy: Secondary | ICD-10-CM | POA: Diagnosis not present

## 2022-01-31 DIAGNOSIS — G4730 Sleep apnea, unspecified: Secondary | ICD-10-CM | POA: Insufficient documentation

## 2022-01-31 DIAGNOSIS — Z951 Presence of aortocoronary bypass graft: Secondary | ICD-10-CM | POA: Diagnosis not present

## 2022-01-31 DIAGNOSIS — Z95810 Presence of automatic (implantable) cardiac defibrillator: Secondary | ICD-10-CM | POA: Diagnosis not present

## 2022-01-31 DIAGNOSIS — N189 Chronic kidney disease, unspecified: Secondary | ICD-10-CM | POA: Diagnosis not present

## 2022-01-31 DIAGNOSIS — I480 Paroxysmal atrial fibrillation: Secondary | ICD-10-CM | POA: Diagnosis not present

## 2022-01-31 DIAGNOSIS — Z7984 Long term (current) use of oral hypoglycemic drugs: Secondary | ICD-10-CM | POA: Insufficient documentation

## 2022-01-31 DIAGNOSIS — E1122 Type 2 diabetes mellitus with diabetic chronic kidney disease: Secondary | ICD-10-CM | POA: Insufficient documentation

## 2022-01-31 LAB — BASIC METABOLIC PANEL
Anion gap: 7 (ref 5–15)
BUN: 34 mg/dL — ABNORMAL HIGH (ref 8–23)
CO2: 25 mmol/L (ref 22–32)
Calcium: 8.7 mg/dL — ABNORMAL LOW (ref 8.9–10.3)
Chloride: 104 mmol/L (ref 98–111)
Creatinine, Ser: 2 mg/dL — ABNORMAL HIGH (ref 0.61–1.24)
GFR, Estimated: 35 mL/min — ABNORMAL LOW (ref >60)
Glucose, Bld: 256 mg/dL — ABNORMAL HIGH (ref 70–99)
Potassium: 4.2 mmol/L (ref 3.5–5.1)
Sodium: 136 mmol/L (ref 135–145)

## 2022-01-31 LAB — BRAIN NATRIURETIC PEPTIDE: B Natriuretic Peptide: 1027.9 pg/mL — ABNORMAL HIGH (ref 0.0–100.0)

## 2022-01-31 NOTE — Telephone Encounter (Addendum)
Provider note and lab results below reviewed with patient/patient spouse.  Pt's spouse verbalized that they will take an extra dose of 20 mg torsemide today.  They had no questions or concerns at this time. Georg Ruddle, RN ----- Message from Alisa Graff, [place] sent at 01/31/2022  1:12 PM EDT ----- Potassium is normal and BNP level is decreasing. Your kidney function is stable with GFR of 35. Based on this, it's ok to take extra '20mg'$  torsemide and he should probably take the extra dose today or if it's too late in the day, take extra dose tomorrow.

## 2022-01-31 NOTE — Progress Notes (Signed)
Patient ID: Jermaine Kramer, male    DOB: Mar 02, 1951, 71 y.o.   MRN: [id number]  HPI  Jermaine Kramer is a 71 y/o male with a history of leukemia, DM, CAD (CABG 02/16/21), AF, sleep apnea, hyperlipidemia, HTN, stroke, GERD & chronic heart failure.   Echo report from 09/16/21 reviewed and showed an EF of 30-35% along with mild Jermaine. Echo report from 05/16/21 reviewed and showed and EF of <20% along with mild/moderate LAE and mild/moderate Jermaine.   Was in the ED 12/06/21 due to LLQ abdominal pain along with watery bowel movements. CT of the abdomen and pelvis without IV contrast shows a moderate amount of stool throughout the colon as well as nonobstructing left renal calculi with no evidence of ureteral calculus or hydronephrosis. Was in the ED 12/02/21 due to LLQ abdominal pain. CT imaging without any evidence of obstruction or ileus. No signs of UTI. Symptoms improved and he was released. Admitted 10/04/21 due to severe nausea diarrhea and poor oral intake. IVF hydration gently done. Hypokalemia corrected. Able to keep PO fluids done and he was released. Discharged after 3 days. Was in the ED 10/03/21 due to nausea, vomiting and diarrhea along with abdominal pain. IVF given. Abdominal CT negative. + for rotavirus. Felt better and he was released. Admitted 09/15/21 due to worsening SOB and weight gain due to acute on chronic HF. Given IV lasix with transition to oral diuretics. Cardiology consult obtained. ACEi deferred to renal function. Discharged after 2 days. Admitted 07/24/21 due to  worsening left flank pain today, which is constant, sharp, 8 out of 10 in severity, nonradiating. CT of lumbar spine is negative for acute injury, but showed degenerative disc disease.  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 Urology and GI consults obtained. Discharged after 3 days. Was in the ED 07/21/21 due to left sided flank pain. CT as positive for a kidney stone that is in the left kidney,  nonobstructing. He was released with pain medication. AICD implanted 06/22/21.   He presents today for an follow-up visit with a chief complaint of moderate shortness of breath with minimal exertion. Describes this as chronic in nature having been present for several years although continues to feel like it's worsening due to abdominal swelling. Has associated fatigue, cough, abdominal distention/ pain, weakness, difficulty sleeping and gradual weight gain. He denies any dizziness, palpitations, pedal edema, chest pain or wheezing.   Is having abdominal CT done next week. Years ago, he had an area along the right side of his abdomen that had to be surgically opened up and removed/ drained.   Started 12.'5mg'$  losartan last week.  Has upcoming cataract surgery on 03/21/22 and 04/05/22  Past Medical History:  Diagnosis Date   Arrhythmia    atrial fibrillation   CHF (congestive heart failure) (HCC)    Coronary artery disease    Diabetes mellitus without complication (HCC)    GERD (gastroesophageal reflux disease)    Hypercholesteremia    Hypertension    Sleep apnea    Stroke [facility])    Past Surgical History:  Procedure Laterality Date   CHOLECYSTECTOMY     COLONOSCOPY WITH PROPOFOL N/A 04/14/2019   Procedure: COLONOSCOPY WITH PROPOFOL;  Surgeon: Toledo, Benay Pike, MD;  Location: [facility] ENDOSCOPY;  Service: Gastroenterology;  Laterality: N/A;   CORONARY ARTERY BYPASS GRAFT  02/16/2021   ICD IMPLANT N/A 06/22/2021   Procedure: ICD IMPLANT;  Surgeon: Vickie Epley, MD;  Location: [place]  [facility];  Service: Cardiovascular;  Laterality: N/A;   KNEE ARTHROSCOPY     RENAL CYST EXCISION     Family History  Problem Relation Age of Onset   Parkinson's disease Mother    Lupus Mother    Heart disease Father    Social History   Tobacco Use   Smoking status: Never   Smokeless tobacco: Never  Substance Use Topics   Alcohol use: Not Currently   Allergies  Allergen Reactions    Doxycycline Anaphylaxis    Patient does not recall having this reaction.     Heparin Other (See Comments)    heparin-induced thrombocytopenia    Empagliflozin Itching    Groin infection Caused a groin infection.     Prior to Admission medications   Medication Sig Start Date End Date Taking? Authorizing Provider  amiodarone (PACERONE) 200 MG tablet Take 200 mg by mouth daily. 03/06/21  Yes [provider]  apixaban (ELIQUIS) 5 MG TABS tablet Take 1 tablet (5 mg total) by mouth 2 (two) times daily. 11/28/21  Yes Darylene Price A, FNP  aspirin EC 81 MG tablet Take 81 mg by mouth daily.   Yes [provider]  atorvastatin (LIPITOR) 80 MG tablet Take 1 tablet (80 mg total) by mouth daily. 01/10/22  Yes Aniesha Haughn, Otila Kluver A, FNP  bisacodyl (DULCOLAX) 5 MG EC tablet Take 1 tablet (5 mg total) by mouth daily as needed for moderate constipation. 12/06/21 12/06/22 Yes Naaman Plummer, MD  glucose blood (PRECISION QID TEST) test strip Use 3 (three) times daily Use as instructed. One touch ultra 07/05/21 07/05/22 Yes [provider]  losartan (COZAAR) 25 MG tablet Take 0.5 tablets (12.5 mg total) by mouth daily. 01/24/22  Yes Marjoria Mancillas, Otila Kluver A, FNP  metFORMIN (GLUCOPHAGE) 1000 MG tablet Take 500 mg by mouth 2 (two) times daily with a meal.   Yes [provider]  metoprolol succinate (TOPROL-XL) 25 MG 24 hr tablet Take 0.5 tablets (12.5 mg total) by mouth every evening. 10/11/21  Yes Corrin Sieling, Otila Kluver A, FNP  omeprazole (PRILOSEC) 40 MG capsule Take 40 mg by mouth 2 (two) times daily. 03/11/21  Yes [provider]  ondansetron (ZOFRAN) 4 MG tablet Take 4 mg by mouth every 8 (eight) hours as needed. 09/13/21  Yes [provider]  spironolactone (ALDACTONE) 25 MG tablet Take 12.5 mg by mouth daily. 09/20/21  Yes [provider]  tamsulosin (FLOMAX) 0.4 MG CAPS capsule Take 0.8 mg by mouth in the morning and at bedtime.   Yes [provider]  torsemide (DEMADEX)  20 MG tablet Take 1 tablet (20 mg total) by mouth daily. And additional '20mg'$  if needed 09/30/21  Yes Orpheus Hayhurst, Aura Fey, FNP  lactulose (CHRONULAC) 10 GM/15ML solution Take 30 mLs (20 g total) by mouth 2 (two) times daily as needed for mild constipation. Patient not taking: Reported on 01/10/2022 12/02/21   Merlyn Lot, MD   Review of Systems  Constitutional:  Positive for fatigue (easily). Negative for appetite change.  HENT:  Negative for congestion, postnasal drip and sore throat.   Eyes: Negative.   Respiratory:  Positive for cough and shortness of breath (easily). Negative for chest tightness and wheezing.   Cardiovascular:  Negative for chest pain, palpitations and leg swelling.  Gastrointestinal:  Positive for abdominal distention (worsening) and abdominal pain. Negative for constipation and diarrhea.  Endocrine: Negative.   Genitourinary: Negative.   Musculoskeletal:  Negative for back pain and neck pain.  Skin: Negative.  Allergic/Immunologic: Negative.   Neurological:  Positive for weakness. Negative for dizziness and light-headedness.  Hematological:  Negative for adenopathy. Does not bruise/bleed easily.  Psychiatric/Behavioral:  Positive for sleep disturbance (sleeping on 2 pillows waking up frequently due urination). Negative for dysphoric mood. The patient is not nervous/anxious.    Vitals:   01/31/22 0941  BP: 120/86  Pulse: 70  Resp: 16  SpO2: 99%  Weight: 188 lb (85.3 kg)  Height: '5\' 5"'$  (1.651 m)   Wt Readings from Last 3 Encounters:  01/31/22 188 lb (85.3 kg)  01/24/22 185 lb (83.9 kg)  01/10/22 183 lb 4 oz (83.1 kg)   Lab Results  Component Value Date   CREATININE 2.03 (H) 01/24/2022   CREATININE 2.10 (H) 12/06/2021   CREATININE 1.88 (H) 12/02/2021   Physical Exam Vitals and nursing note reviewed. Exam conducted with a chaperone present (wife).  Constitutional:      Appearance: Normal appearance.  HENT:     Head: Normocephalic and atraumatic.   Cardiovascular:     Rate and Rhythm: Normal rate and regular rhythm.  Pulmonary:     Effort: Pulmonary effort is normal. No respiratory distress.     Breath sounds: No wheezing or rales.  Abdominal:     General: There is distension.     Palpations: Abdomen is soft.     Tenderness: There is no abdominal tenderness.     Comments: Swelling noted on right lateral side of abdomen  Musculoskeletal:        General: No tenderness.     Cervical back: Normal range of motion and neck supple.     Right lower leg: No edema.     Left lower leg: No edema.  Skin:    General: Skin is warm and dry.  Neurological:     General: No focal deficit present.     Mental Status: He is alert and oriented to person, place, and time.  Psychiatric:        Mood and Affect: Mood normal.        Behavior: Behavior normal.        Thought Content: Thought content normal.   Assessment & Plan:  1: Chronic heart failure with reduced ejection fraction- - NYHA class III - continues to be minimally fluid overloaded today - weighing daily & home weight chart reviewed; reminded to call for an overnight weight gain of > 2 pounds or a weekly weight gain of >5 pounds - used furoscix 01/28/22 at home due to weight gain - weight up 3 pounds from last visit here 1 week ago  - not adding "much" salt and has been reading food labels for sodium content; understands to keep daily sodium intake to '2000mg'$  / day - on GDMT of metoprolol and spironolactone & losartan - allergic to empagliflozin with a yeast infection - saw EP Quentin Ore) 09/21/21  - had AICD implanted 06/22/21; no shocks have been delivered - BNP 01/24/22 was 1238.8 - re-check BNP today - has received his flu vaccine for this season - scheduled for abdominal/ pelvic CT on 02/08/22  2: HTN- - BP looks good (120/86) - saw PCP [name]) 01/13/22 - BMP 01/24/22 reviewed and showed sodium 140, potassium 4.6, creatinine 2.03 & GFR 34 - recheck BMP today as losartan was  started last week  3: DM with CKD- - A1c 11/10/21 was 6.9% - fasting glucose at home today was 197 (has been rising since reduction in metformin) - taking metformin (dose has been reduced) &  wife feels like his weight gain/ abdominal distention have worsened since that time - has nephrology appt on 02/08/22  4: Atrial fibrillation- - saw cardiology Corky Sox) 11/03/21; returns next week - currently on amiodarone, metoprolol and apixaban  5: CAD- - CABG with a LIMA to LAD, SVG to ramus, and SVG to PDA on 02/16/21 - finished cardiac rehab; encouraged to continue walking    Medication list reviewed.   Return in 2 weeks, sooner if needed.

## 2022-01-31 NOTE — Patient Instructions (Signed)
Continue weighing daily and call for an overnight weight gain of 3 pounds or more or a weekly weight gain of more than 5 pounds  If you have voicemail, please make sure your mailbox is cleaned out so that we may leave a message and please make sure to listen to any voicemails.    If you receive a satisfaction survey regarding the [facility], please take the time to fill it out. This way we can continue to provide excellent care and make any changes that need to be made.     

## 2022-02-08 ENCOUNTER — Ambulatory Visit
Admission: RE | Admit: 2022-02-08 | Discharge: 2022-02-08 | Disposition: A | Discharge status: Home / Self Care | Payer: Medicare Other | Source: Ambulatory Visit | Attending: Gastroenterology | Admitting: Gastroenterology

## 2022-02-08 DIAGNOSIS — R9389 Abnormal findings on diagnostic imaging of other specified body structures: Secondary | ICD-10-CM | POA: Diagnosis present

## 2022-02-08 LAB — POCT I-STAT CREATININE: Creatinine, Ser: 2.1 mg/dL — ABNORMAL HIGH (ref 0.61–1.24)

## 2022-02-08 MED ORDER — IOHEXOL 300 MG/ML  SOLN
60.0000 mL | Freq: Once | INTRAMUSCULAR | Status: AC | PRN
  Administered 2022-02-08: 60 mL via INTRAVENOUS

## 2022-02-13 ENCOUNTER — Other Ambulatory Visit: Payer: Self-pay | Admitting: Nephrology

## 2022-02-13 ENCOUNTER — Other Ambulatory Visit (HOSPITAL_COMMUNITY): Payer: Self-pay | Admitting: Nephrology

## 2022-02-13 DIAGNOSIS — E1122 Type 2 diabetes mellitus with diabetic chronic kidney disease: Secondary | ICD-10-CM

## 2022-02-13 NOTE — Progress Notes (Signed)
Bladder wall thickeninhg - needs to follow up with PCP- send copy to pcp

## 2022-02-14 ENCOUNTER — Ambulatory Visit: Payer: Medicare Other | Admitting: Family

## 2022-02-17 ENCOUNTER — Telehealth: Payer: Self-pay

## 2022-02-17 ENCOUNTER — Ambulatory Visit
Admission: RE | Admit: 2022-02-17 | Discharge: 2022-02-17 | Disposition: A | Discharge status: Home / Self Care | Payer: Medicare Other | Source: Ambulatory Visit | Attending: Nephrology | Admitting: Nephrology

## 2022-02-17 DIAGNOSIS — N1832 Chronic kidney disease, stage 3b: Secondary | ICD-10-CM | POA: Insufficient documentation

## 2022-02-17 DIAGNOSIS — E1122 Type 2 diabetes mellitus with diabetic chronic kidney disease: Secondary | ICD-10-CM | POA: Diagnosis present

## 2022-02-17 NOTE — Telephone Encounter (Signed)
-----   Message from [name], MD sent at 02/13/2022 12:16 PM EDT ----- Bladder wall thickeninhg - needs to follow up with PCP- send copy to pcp

## 2022-02-17 NOTE — Telephone Encounter (Signed)
Called patient to let him know the below information. I also told him that I would be notifying his PCP about the bladder wall thickening so she could address it with the proper specialty. Patient agreed and had no further questions.  Patient's CT Scan and office visit was faxed to his PCP's office for her to review and get in contact with the patient.

## 2022-02-21 ENCOUNTER — Other Ambulatory Visit: Payer: Self-pay | Admitting: Family

## 2022-02-21 MED ORDER — TORSEMIDE 20 MG PO TABS: 20.0000 mg | ORAL_TABLET | Freq: Every day | ORAL | 3 refills | Status: DC

## 2022-02-21 NOTE — Progress Notes (Signed)
Refilled torsemide

## 2022-03-01 ENCOUNTER — Telehealth (HOSPITAL_COMMUNITY): Payer: Self-pay

## 2022-03-01 ENCOUNTER — Encounter: Payer: Self-pay | Admitting: Family

## 2022-03-01 ENCOUNTER — Ambulatory Visit: Payer: Medicare Other | Attending: Family | Admitting: Family

## 2022-03-01 ENCOUNTER — Other Ambulatory Visit (HOSPITAL_COMMUNITY): Payer: Self-pay

## 2022-03-01 VITALS — BP 107/67 | HR 69 | Resp 18 | Ht 65.0 in | Wt 188.0 lb

## 2022-03-01 DIAGNOSIS — Z951 Presence of aortocoronary bypass graft: Secondary | ICD-10-CM | POA: Diagnosis not present

## 2022-03-01 DIAGNOSIS — Z8673 Personal history of transient ischemic attack (TIA), and cerebral infarction without residual deficits: Secondary | ICD-10-CM | POA: Insufficient documentation

## 2022-03-01 DIAGNOSIS — E876 Hypokalemia: Secondary | ICD-10-CM | POA: Insufficient documentation

## 2022-03-01 DIAGNOSIS — N189 Chronic kidney disease, unspecified: Secondary | ICD-10-CM | POA: Diagnosis not present

## 2022-03-01 DIAGNOSIS — Z95810 Presence of automatic (implantable) cardiac defibrillator: Secondary | ICD-10-CM | POA: Diagnosis not present

## 2022-03-01 DIAGNOSIS — I255 Ischemic cardiomyopathy: Secondary | ICD-10-CM | POA: Diagnosis not present

## 2022-03-01 DIAGNOSIS — E1122 Type 2 diabetes mellitus with diabetic chronic kidney disease: Secondary | ICD-10-CM | POA: Insufficient documentation

## 2022-03-01 DIAGNOSIS — I130 Hypertensive heart and chronic kidney disease with heart failure and stage 1 through stage 4 chronic kidney disease, or unspecified chronic kidney disease: Secondary | ICD-10-CM | POA: Diagnosis present

## 2022-03-01 DIAGNOSIS — I2510 Atherosclerotic heart disease of native coronary artery without angina pectoris: Secondary | ICD-10-CM | POA: Insufficient documentation

## 2022-03-01 DIAGNOSIS — N1832 Chronic kidney disease, stage 3b: Secondary | ICD-10-CM

## 2022-03-01 DIAGNOSIS — K219 Gastro-esophageal reflux disease without esophagitis: Secondary | ICD-10-CM | POA: Diagnosis not present

## 2022-03-01 DIAGNOSIS — I10 Essential (primary) hypertension: Secondary | ICD-10-CM | POA: Diagnosis not present

## 2022-03-01 DIAGNOSIS — I5022 Chronic systolic (congestive) heart failure: Secondary | ICD-10-CM | POA: Insufficient documentation

## 2022-03-01 DIAGNOSIS — I480 Paroxysmal atrial fibrillation: Secondary | ICD-10-CM

## 2022-03-01 DIAGNOSIS — Z7901 Long term (current) use of anticoagulants: Secondary | ICD-10-CM | POA: Diagnosis not present

## 2022-03-01 DIAGNOSIS — G4730 Sleep apnea, unspecified: Secondary | ICD-10-CM | POA: Insufficient documentation

## 2022-03-01 DIAGNOSIS — I4891 Unspecified atrial fibrillation: Secondary | ICD-10-CM | POA: Insufficient documentation

## 2022-03-01 NOTE — Progress Notes (Unsigned)
Patient ID: Jermaine Kramer, male    DOB: Mar 02, 1951, 71 y.o.   MRN: [id number]  HPI  Jermaine Kramer is a 71 y/o male with a history of leukemia, DM, CAD (CABG 02/16/21), AF, sleep apnea, hyperlipidemia, HTN, stroke, GERD & chronic heart failure.   Echo report from 09/16/21 reviewed and showed an EF of 30-35% along with mild Jermaine. Echo report from 05/16/21 reviewed and showed and EF of <20% along with mild/moderate LAE and mild/moderate Jermaine.   Was in the ED 12/06/21 due to LLQ abdominal pain along with watery bowel movements. CT of the abdomen and pelvis without IV contrast shows a moderate amount of stool throughout the colon as well as nonobstructing left renal calculi with no evidence of ureteral calculus or hydronephrosis. Was in the ED 12/02/21 due to LLQ abdominal pain. CT imaging without any evidence of obstruction or ileus. No signs of UTI. Symptoms improved and he was released. Admitted 10/04/21 due to severe nausea diarrhea and poor oral intake. IVF hydration gently done. Hypokalemia corrected. Able to keep PO fluids done and he was released. Discharged after 3 days. Was in the ED 10/03/21 due to nausea, vomiting and diarrhea along with abdominal pain. IVF given. Abdominal CT negative. + for rotavirus. Felt better and he was released. Admitted 09/15/21 due to worsening SOB and weight gain due to acute on chronic HF. Given IV lasix with transition to oral diuretics. Cardiology consult obtained. ACEi deferred to renal function. Discharged after 2 days. Admitted 07/24/21 due to  worsening left flank pain today, which is constant, sharp, 8 out of 10 in severity, nonradiating. CT of lumbar spine is negative for acute injury, but showed degenerative disc disease.  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 Urology and GI consults obtained. Discharged after 3 days. Was in the ED 07/21/21 due to left sided flank pain. CT as positive for a kidney stone that is in the left kidney,  nonobstructing. He was released with pain medication. AICD implanted 06/22/21.   He presents today for an follow-up visit with a chief complaint of moderate shortness of breath with minimal exertion. Describes this as chronic in nature having been present for several years although continues to feel like it's worsening due to abdominal swelling. Has associated fatigue, cough, abdominal distention/ pain, weakness, difficulty sleeping and gradual weight gain. He denies any dizziness, palpitations, pedal edema, chest pain or wheezing.   Is having abdominal CT done next week. Years ago, he had an area along the right side of his abdomen that had to be surgically opened up and removed/ drained.   Started 12.'5mg'$  losartan last week.  Has upcoming cataract surgery on 03/21/22 and 04/05/22  Past Medical History:  Diagnosis Date   Arrhythmia    atrial fibrillation   CHF (congestive heart failure) (HCC)    Coronary artery disease    Diabetes mellitus without complication (HCC)    GERD (gastroesophageal reflux disease)    Hypercholesteremia    Hypertension    Sleep apnea    Stroke [facility])    Past Surgical History:  Procedure Laterality Date   CHOLECYSTECTOMY     COLONOSCOPY WITH PROPOFOL N/A 04/14/2019   Procedure: COLONOSCOPY WITH PROPOFOL;  Surgeon: Toledo, Benay Pike, MD;  Location: [facility] ENDOSCOPY;  Service: Gastroenterology;  Laterality: N/A;   CORONARY ARTERY BYPASS GRAFT  02/16/2021   ICD IMPLANT N/A 06/22/2021   Procedure: ICD IMPLANT;  Surgeon: Vickie Epley, MD;  Location: [place]  CV LAB;  Service: Cardiovascular;  Laterality: N/A;   KNEE ARTHROSCOPY     RENAL CYST EXCISION     Family History  Problem Relation Age of Onset   Parkinson's disease Mother    Lupus Mother    Heart disease Father    Social History   Tobacco Use   Smoking status: Never   Smokeless tobacco: Never  Substance Use Topics   Alcohol use: Not Currently   Allergies  Allergen Reactions    Doxycycline Anaphylaxis    Patient does not recall having this reaction.     Heparin Other (See Comments)    heparin-induced thrombocytopenia    Empagliflozin Itching    Groin infection Caused a groin infection.     Prior to Admission medications   Medication Sig Start Date End Date Taking? Authorizing Provider  amiodarone (PACERONE) 200 MG tablet Take 200 mg by mouth daily. 03/06/21  Yes [provider]  apixaban (ELIQUIS) 5 MG TABS tablet Take 1 tablet (5 mg total) by mouth 2 (two) times daily. 11/28/21  Yes Darylene Price A, FNP  aspirin EC 81 MG tablet Take 81 mg by mouth daily.   Yes [provider]  atorvastatin (LIPITOR) 80 MG tablet Take 1 tablet (80 mg total) by mouth daily. 01/10/22  Yes Jaycen Vercher, Otila Kluver A, FNP  bisacodyl (DULCOLAX) 5 MG EC tablet Take 1 tablet (5 mg total) by mouth daily as needed for moderate constipation. 12/06/21 12/06/22 Yes Naaman Plummer, MD  glucose blood (PRECISION QID TEST) test strip Use 3 (three) times daily Use as instructed. One touch ultra 07/05/21 07/05/22 Yes [provider]  losartan (COZAAR) 25 MG tablet Take 0.5 tablets (12.5 mg total) by mouth daily. 01/24/22  Yes Traeh Milroy, Otila Kluver A, FNP  metFORMIN (GLUCOPHAGE) 1000 MG tablet Take 500 mg by mouth 2 (two) times daily with a meal.   Yes [provider]  metoprolol succinate (TOPROL-XL) 25 MG 24 hr tablet Take 0.5 tablets (12.5 mg total) by mouth every evening. 10/11/21  Yes Namiyah Grantham, Otila Kluver A, FNP  omeprazole (PRILOSEC) 40 MG capsule Take 40 mg by mouth 2 (two) times daily. 03/11/21  Yes [provider]  ondansetron (ZOFRAN) 4 MG tablet Take 4 mg by mouth every 8 (eight) hours as needed. 09/13/21  Yes [provider]  spironolactone (ALDACTONE) 25 MG tablet Take 12.5 mg by mouth daily. 09/20/21  Yes [provider]  tamsulosin (FLOMAX) 0.4 MG CAPS capsule Take 0.8 mg by mouth in the morning and at bedtime.   Yes [provider]  torsemide (DEMADEX)  20 MG tablet Take 1 tablet (20 mg total) by mouth daily. And additional '20mg'$  if needed 09/30/21  Yes Brooklyne Radke, Aura Fey, FNP  lactulose (CHRONULAC) 10 GM/15ML solution Take 30 mLs (20 g total) by mouth 2 (two) times daily as needed for mild constipation. Patient not taking: Reported on 01/10/2022 12/02/21   Merlyn Lot, MD   Review of Systems  Constitutional:  Positive for fatigue. Negative for appetite change.  HENT:  Negative for congestion, postnasal drip and sore throat.   Eyes: Negative.   Respiratory:  Positive for shortness of breath. Negative for cough and chest tightness.   Cardiovascular:  Negative for chest pain, palpitations and leg swelling.  Gastrointestinal:  Negative for abdominal distention, abdominal pain, constipation and diarrhea.  Endocrine: Negative.   Genitourinary: Negative.   Musculoskeletal:  Negative for back pain.  Skin: Negative.   Allergic/Immunologic: Negative.   Neurological:  Positive for weakness. Negative  for dizziness and light-headedness.  Hematological:  Negative for adenopathy. Does not bruise/bleed easily.  Psychiatric/Behavioral:  Positive for sleep disturbance (sleeping on 2 pillows waking up frequently due urination). Negative for dysphoric mood. The patient is not nervous/anxious.    Vitals:   03/01/22 0848  BP: 107/67  Pulse: 69  Resp: 18  SpO2: 98%  Weight: 188 lb (85.3 kg)  Height: '5\' 5"'$  (1.651 m)   Wt Readings from Last 3 Encounters:  03/01/22 188 lb (85.3 kg)  01/31/22 188 lb (85.3 kg)  01/24/22 185 lb (83.9 kg)   Lab Results  Component Value Date   CREATININE 2.10 (H) 02/08/2022   CREATININE 2.00 (H) 01/31/2022   CREATININE 2.03 (H) 01/24/2022   Physical Exam Vitals and nursing note reviewed. Exam conducted with a chaperone present (wife).  Constitutional:      Appearance: Normal appearance.  HENT:     Head: Normocephalic and atraumatic.  Cardiovascular:     Rate and Rhythm: Normal rate and regular rhythm.  Pulmonary:      Effort: Pulmonary effort is normal. No respiratory distress.     Breath sounds: No wheezing or rales.  Abdominal:     General: There is distension.     Palpations: Abdomen is soft.     Tenderness: There is no abdominal tenderness.     Comments: Swelling noted on right lateral side of abdomen  Musculoskeletal:        General: No tenderness.     Cervical back: Normal range of motion and neck supple.     Right lower leg: No edema.     Left lower leg: No edema.  Skin:    General: Skin is warm and dry.  Neurological:     General: No focal deficit present.     Mental Status: He is alert and oriented to person, place, and time.  Psychiatric:        Mood and Affect: Mood normal.        Behavior: Behavior normal.        Thought Content: Thought content normal.   Assessment & Plan:  1: Chronic heart failure with reduced ejection fraction- - NYHA class III - continues to be minimally fluid overloaded today - weighing daily & home weight chart reviewed; reminded to call for an overnight weight gain of > 2 pounds or a weekly weight gain of >5 pounds - used furoscix 01/28/22 at home due to weight gain - weight up 3 pounds from last visit here 1 week ago  - not adding "much" salt and has been reading food labels for sodium content; understands to keep daily sodium intake to '2000mg'$  / day - on GDMT of metoprolol and spironolactone & losartan - allergic to empagliflozin with a yeast infection - saw EP Quentin Ore) 09/21/21  - had AICD implanted 06/22/21; no shocks have been delivered - BNP 01/24/22 was 1238.8 - re-check BNP today - has received his flu vaccine for this season - scheduled for abdominal/ pelvic CT on 02/08/22  2: HTN- - BP looks good (120/86) - saw PCP [name]) 01/13/22 - BMP 01/24/22 reviewed and showed sodium 140, potassium 4.6, creatinine 2.03 & GFR 34 - recheck BMP today as losartan was started last week  3: DM with CKD- - A1c 11/10/21 was 6.9% - fasting glucose at home today  was 197 (has been rising since reduction in metformin) - taking metformin (dose has been reduced) & wife feels like his weight gain/ abdominal distention have worsened since that  time - has nephrology appt on 02/08/22  4: Atrial fibrillation- - saw cardiology Corky Sox) 11/03/21; returns next week - currently on amiodarone, metoprolol and apixaban  5: CAD- - CABG with a LIMA to LAD, SVG to ramus, and SVG to PDA on 02/16/21 - finished cardiac rehab; encouraged to continue walking    Medication list reviewed.   Return in 2 weeks, sooner if needed.

## 2022-03-01 NOTE — Patient Instructions (Signed)
Continue weighing daily and call for an overnight weight gain of 3 pounds or more or a weekly weight gain of more than 5 pounds.   If you have voicemail, please make sure your mailbox is cleaned out so that we may leave a message and please make sure to listen to any voicemails.     

## 2022-03-01 NOTE — Progress Notes (Signed)
[place] - PHARMACIST COUNSELING NOTE  Guideline-Directed Medical Therapy/Evidence Based Medicine  ACE/ARB/ARNI: Losartan 12.5 mg daily Beta Blocker: Metoprolol succinate 12.5 mg daily Aldosterone Antagonist: Spironolactone 12.5 mg daily Diuretic: Torsemide 20 mg daily SGLT2i:  None - pt had a groin infection. He has tried [place].   Adherence Assessment  Do you ever forget to take your medication? _0 Yes _1 No  Do you ever skip doses due to side effects? _2 Yes _3 No  Do you have trouble affording your medicines? In the donut hole.  _4 Yes _5 No  Are you ever unable to pick up your medication due to transportation difficulties? _6 Yes _7 No  Do you ever stop taking your medications because you don't believe they are helping? _8 Yes _9 No  Do you check your weight daily? _10 Yes _11 No   Adherence strategy: Wife takes care of medications.   Barriers to obtaining medications: Donut hole.   Vital signs: HR 69, BP 107/67, weight (pounds) 188 lb (baseline seems tobe 185 lb) ECHO: Date 09/16/2021, EF 30-35,  Notes:  The left ventricle has moderately decreased function. The left ventricle demonstrates global hypokinesis. The left ventricular internal cavity size was severely dilated. Left ventricular diastolic parameters are consistent with Grade III diastolic dysfunction (restrictive).      Latest Ref Rng & Units 02/08/2022    9:31 AM 01/31/2022    9:37 AM 01/24/2022   10:22 AM  BMP  Glucose 70 - 99 mg/dL  256  151   BUN 8 - 23 mg/dL  34  35   Creatinine 0.61 - 1.24 mg/dL 2.10  2.00  2.03   Sodium 135 - 145 mmol/L  136  140   Potassium 3.5 - 5.1 mmol/L  4.2  4.6   Chloride 98 - 111 mmol/L  104  105   CO2 22 - 32 mmol/L  25  21   Calcium 8.9 - 10.3 mg/dL  8.7  9.2     Past Medical History:  Diagnosis Date   Arrhythmia    atrial fibrillation   CHF (congestive heart failure) (HCC)    Coronary artery disease    Diabetes mellitus  without complication (HCC)    GERD (gastroesophageal reflux disease)    Hypercholesteremia    Hypertension    Sleep apnea    Stroke [facility])     ASSESSMENT 71 year old male who presents to the HF clinic for a follow up.  history of leukemia, DM, CAD (CABG 02/16/21), AF, sleep apnea, hyperlipidemia, HTN, stroke, GERD & chronic heart failure. Patient has some days where he gain > 3 lbs and they are taking an extra torsemide tablet. Wife monitor's daily and tracks it daily. Overall, they state patient is doing well, but still has some shortness breath while walking, but its not getting worse. Potassium > 4 and Scr around 2.1 (baseline). Currently on GDMT for HF, BB, ARB, MRA and loop. Patient cannot tolerate SGLT2 due to groin infection. No issues getting/affording medication. Patient is in the donut hole, so the cost of apixaban is high currently. Patient has soft blood pressure, making it difficult to titrate medications to target doses. Patient has some tremors, but I do not think its medication related.   Recent ED Visit (past 6 months): Date - 12/06/2021, [facility] - abdominal pain   PLAN CHF: EF 30-35.  Continue losartan, metoprolol succ, spironolactone, and torsemide. Difficult to titrate up the medication due to soft blood pressure.  Continue to check weights daily. Call the clinic if  gain > 3 lb in 24 hours or 5 lb in a week.  Limit salt and fluid intake as directed.   HTN:  Continue current medications.  Monitor for signs and symptoms of hypotension (lightheadedness, headaches, etc)  Check blood pressure at least three times a week and when having signs and symptoms of hypotension   Afib:  Continue metoprolol succ and amiodarone and apixaban.  Monitor potassium and magnesium and Qtc and heart rate and any signs or symptoms of bleeding.    CAD s/p CABG: Continue aspirin. Follow up with cardiology if still needed since patient is now on apixaban.   CKD: Continue losartan.  Last labs  02/08/2022. Follow up with lab in 1-3 months.   DM:  A1c improved from 7.8 to 6.9 with metformin and dieting.  Metformin was decreased from 1000 mg BID to 500 mg BID due to renal function.  Metformin can be increased to 1000 mg BID with current renal function, metformin is not nephrotoxic. Stop metformin if eGFR < 30.  Recommend to discuss with PCP about starting a GLP-1 for DM, CV and weight loss benefit. Co-pay for ozempic $245.76 Due to coverage gap.     Time spent: 30 minutes  Oswald Hillock, Pharm.D. Clinical Pharmacist 03/01/2022 9:09 AM    Current Outpatient Medications:    amiodarone (PACERONE) 200 MG tablet, Take 200 mg by mouth daily., Disp: , Rfl:    apixaban (ELIQUIS) 5 MG TABS tablet, Take 1 tablet (5 mg total) by mouth 2 (two) times daily., Disp: 180 tablet, Rfl: 3   aspirin EC 81 MG tablet, Take 81 mg by mouth daily., Disp: , Rfl:    atorvastatin (LIPITOR) 80 MG tablet, Take 1 tablet (80 mg total) by mouth daily., Disp: 30 tablet, Rfl: 5   bisacodyl (DULCOLAX) 5 MG EC tablet, Take 1 tablet (5 mg total) by mouth daily as needed for moderate constipation. (Patient not taking: Reported on 03/01/2022), Disp: 30 tablet, Rfl: 1   glucose blood (PRECISION QID TEST) test strip, Use 3 (three) times daily Use as instructed. One touch ultra, Disp: , Rfl:    lactulose (CHRONULAC) 10 GM/15ML solution, Take 30 mLs (20 g total) by mouth 2 (two) times daily as needed for mild constipation. (Patient not taking: Reported on 01/10/2022), Disp: 236 mL, Rfl: 0   losartan (COZAAR) 25 MG tablet, Take 0.5 tablets (12.5 mg total) by mouth daily., Disp: 30 tablet, Rfl: 3   metFORMIN (GLUCOPHAGE) 1000 MG tablet, Take 500 mg by mouth 2 (two) times daily with a meal., Disp: , Rfl:    metoprolol succinate (TOPROL-XL) 25 MG 24 hr tablet, Take 0.5 tablets (12.5 mg total) by mouth every evening., Disp: 45 tablet, Rfl: 3   omeprazole (PRILOSEC) 40 MG capsule, Take 40 mg by mouth daily., Disp: , Rfl:     ondansetron (ZOFRAN) 4 MG tablet, Take 4 mg by mouth every 8 (eight) hours as needed., Disp: , Rfl:    spironolactone (ALDACTONE) 25 MG tablet, Take 12.5 mg by mouth daily., Disp: , Rfl:    tamsulosin (FLOMAX) 0.4 MG CAPS capsule, Take 0.8 mg by mouth in the morning and at bedtime., Disp: , Rfl:    torsemide (DEMADEX) 20 MG tablet, Take 1 tablet (20 mg total) by mouth daily. And additional 33m if needed, Disp: 50 tablet, Rfl: 3   COUNSELING POINTS/CLINICAL PEARLS    DRUGS TO CAUTION IN HEART FAILURE  Drug or Class Mechanism  Analgesics NSAIDs COX-2 inhibitors Glucocorticoids  Sodium  and water retention, increased systemic vascular resistance, decreased response to diuretics   Diabetes Medications Metformin Thiazolidinediones Rosiglitazone (Avandia) Pioglitazone (Actos) DPP4 Inhibitors Saxagliptin (Onglyza) Sitagliptin (Januvia)   Lactic acidosis Possible calcium channel blockade   Unknown  Antiarrhythmics Class I  Flecainide Disopyramide Class III Sotalol Other Dronedarone  Negative inotrope, proarrhythmic   Proarrhythmic, beta blockade  Negative inotrope  Antihypertensives Alpha Blockers Doxazosin Calcium Channel Blockers Diltiazem Verapamil Nifedipine Central Alpha Adrenergics Moxonidine Peripheral Vasodilators Minoxidil  Increases renin and aldosterone  Negative inotrope    Possible sympathetic withdrawal  Unknown  Anti-infective Itraconazole Amphotericin B  Negative inotrope Unknown  Hematologic Anagrelide Cilostazol   Possible inhibition of PD IV Inhibition of PD III causing arrhythmias  Neurologic/Psychiatric Stimulants Anti-Seizure Drugs Carbamazepine Pregabalin Antidepressants Tricyclics Citalopram Parkinsons Bromocriptine Pergolide Pramipexole Antipsychotics Clozapine Antimigraine Ergotamine Methysergide Appetite suppressants Bipolar Lithium  Peripheral alpha and beta agonist activity  Negative inotrope and  chronotrope Calcium channel blockade  Negative inotrope, proarrhythmic Dose-dependent QT prolongation  Excessive serotonin activity/valvular damage Excessive serotonin activity/valvular damage Unknown  IgE mediated hypersensitivy, calcium channel blockade  Excessive serotonin activity/valvular damage Excessive serotonin activity/valvular damage Valvular damage  Direct myofibrillar degeneration, adrenergic stimulation  Antimalarials Chloroquine Hydroxychloroquine Intracellular inhibition of lysosomal enzymes  Urologic Agents Alpha Blockers Doxazosin Prazosin Tamsulosin Terazosin  Increased renin and aldosterone  Adapted from Page Carleene Overlie, et al. "Drugs That May Cause or Exacerbate Heart Failure: A Scientific Statement from the American Heart  Association." Circulation 2016; 134:e32-e69. DOI: 10.1161/CIR.0000000000000426   MEDICATION ADHERENCES TIPS AND STRATEGIES Taking medication as prescribed improves patient outcomes in heart failure (reduces hospitalizations, improves symptoms, increases survival) Side effects of medications can be managed by decreasing doses, switching agents, stopping drugs, or adding additional therapy. Please let someone in the [facility] know if you have having bothersome side effects so we can modify your regimen. Do not alter your medication regimen without talking to [place].  Medication reminders can help patients remember to take drugs on time. If you are missing or forgetting doses you can try linking behaviors, using pill boxes, or an electronic reminder like an alarm on your phone or an app. Some people can also get automated phone calls as medication reminders.

## 2022-03-01 NOTE — Telephone Encounter (Signed)
Pharmacy Patient Advocate Encounter  Insurance verification completed.    The patient is insured through [employer]   The patient is currently admitted and ran test claims for the following: Ozempic.  Copays and coinsurance results were relayed to Inpatient clinical team.  Copay: $245.76 (In coverage gap) Insured through: [place], [place] Patient Advocate Specialist [place] Patient Advocate Team Direct Number: [phone] Fax: [phone]

## 2022-03-02 ENCOUNTER — Encounter: Payer: Self-pay | Admitting: Family

## 2022-03-03 ENCOUNTER — Encounter: Payer: Self-pay | Admitting: Gastroenterology

## 2022-03-15 ENCOUNTER — Other Ambulatory Visit: Payer: Self-pay | Admitting: Family

## 2022-03-22 ENCOUNTER — Ambulatory Visit (INDEPENDENT_AMBULATORY_CARE_PROVIDER_SITE_OTHER): Payer: Medicare Other

## 2022-03-22 DIAGNOSIS — Z95810 Presence of automatic (implantable) cardiac defibrillator: Secondary | ICD-10-CM

## 2022-03-22 DIAGNOSIS — I255 Ischemic cardiomyopathy: Secondary | ICD-10-CM | POA: Diagnosis not present

## 2022-03-22 LAB — CUP PACEART REMOTE DEVICE CHECK
Battery Remaining Longevity: 111 mo
Battery Voltage: 3.02 V
Brady Statistic AP VP Percent: 0.03 %
Brady Statistic AP VS Percent: 61.31 %
Brady Statistic AS VP Percent: 0 %
Brady Statistic AS VS Percent: 38.66 %
Brady Statistic RA Percent Paced: 61.26 %
Brady Statistic RV Percent Paced: 0.03 %
Date Time Interrogation Session: 20231122022603
HighPow Impedance: 61 Ohm
Implantable Lead Connection Status: 753985
Implantable Lead Connection Status: 753985
Implantable Lead Implant Date: 20230222
Implantable Lead Implant Date: 20230222
Implantable Lead Location: 753859
Implantable Lead Location: 753860
Implantable Lead Model: 5076
Implantable Pulse Generator Implant Date: 20230222
Lead Channel Impedance Value: 361 Ohm
Lead Channel Impedance Value: 361 Ohm
Lead Channel Impedance Value: 475 Ohm
Lead Channel Pacing Threshold Amplitude: 0.5 V
Lead Channel Pacing Threshold Amplitude: 0.5 V
Lead Channel Pacing Threshold Pulse Width: 0.4 ms
Lead Channel Pacing Threshold Pulse Width: 0.4 ms
Lead Channel Sensing Intrinsic Amplitude: 1.25 mV
Lead Channel Sensing Intrinsic Amplitude: 1.25 mV
Lead Channel Sensing Intrinsic Amplitude: 10.125 mV
Lead Channel Sensing Intrinsic Amplitude: 10.125 mV
Lead Channel Setting Pacing Amplitude: 1.5 V
Lead Channel Setting Pacing Amplitude: 1.5 V
Lead Channel Setting Pacing Pulse Width: 0.4 ms
Lead Channel Setting Sensing Sensitivity: 0.3 mV
Zone Setting Status: 755011
Zone Setting Status: 755011

## 2022-03-27 ENCOUNTER — Other Ambulatory Visit: Payer: Self-pay | Admitting: Family

## 2022-04-06 HISTORY — PX: CATARACT EXTRACTION: SUR2

## 2022-04-07 ENCOUNTER — Other Ambulatory Visit: Payer: Self-pay

## 2022-04-07 ENCOUNTER — Ambulatory Visit (INDEPENDENT_AMBULATORY_CARE_PROVIDER_SITE_OTHER)
Admission: EM | Admit: 2022-04-07 | Discharge: 2022-04-07 | Disposition: A | Discharge status: Home / Self Care | Payer: Medicare Other | Source: Home / Self Care

## 2022-04-07 ENCOUNTER — Encounter: Payer: Self-pay | Admitting: Emergency Medicine

## 2022-04-07 ENCOUNTER — Ambulatory Visit (INDEPENDENT_AMBULATORY_CARE_PROVIDER_SITE_OTHER): Payer: Medicare Other

## 2022-04-07 ENCOUNTER — Emergency Department
Admission: EM | Admit: 2022-04-07 | Discharge: 2022-04-07 | Disposition: A | Discharge status: Home / Self Care | Payer: Medicare Other | Attending: Emergency Medicine | Admitting: Emergency Medicine

## 2022-04-07 DIAGNOSIS — Z20822 Contact with and (suspected) exposure to covid-19: Secondary | ICD-10-CM | POA: Insufficient documentation

## 2022-04-07 DIAGNOSIS — I509 Heart failure, unspecified: Secondary | ICD-10-CM

## 2022-04-07 DIAGNOSIS — R059 Cough, unspecified: Secondary | ICD-10-CM

## 2022-04-07 DIAGNOSIS — Z951 Presence of aortocoronary bypass graft: Secondary | ICD-10-CM | POA: Insufficient documentation

## 2022-04-07 DIAGNOSIS — Z1152 Encounter for screening for COVID-19: Secondary | ICD-10-CM | POA: Insufficient documentation

## 2022-04-07 DIAGNOSIS — Z8249 Family history of ischemic heart disease and other diseases of the circulatory system: Secondary | ICD-10-CM | POA: Insufficient documentation

## 2022-04-07 DIAGNOSIS — I2510 Atherosclerotic heart disease of native coronary artery without angina pectoris: Secondary | ICD-10-CM | POA: Insufficient documentation

## 2022-04-07 DIAGNOSIS — R0602 Shortness of breath: Secondary | ICD-10-CM | POA: Diagnosis not present

## 2022-04-07 DIAGNOSIS — Z20828 Contact with and (suspected) exposure to other viral communicable diseases: Secondary | ICD-10-CM

## 2022-04-07 DIAGNOSIS — R062 Wheezing: Secondary | ICD-10-CM | POA: Insufficient documentation

## 2022-04-07 DIAGNOSIS — I110 Hypertensive heart disease with heart failure: Secondary | ICD-10-CM | POA: Insufficient documentation

## 2022-04-07 DIAGNOSIS — E119 Type 2 diabetes mellitus without complications: Secondary | ICD-10-CM | POA: Insufficient documentation

## 2022-04-07 DIAGNOSIS — Z8673 Personal history of transient ischemic attack (TIA), and cerebral infarction without residual deficits: Secondary | ICD-10-CM | POA: Insufficient documentation

## 2022-04-07 DIAGNOSIS — R0781 Pleurodynia: Secondary | ICD-10-CM | POA: Insufficient documentation

## 2022-04-07 DIAGNOSIS — R0600 Dyspnea, unspecified: Secondary | ICD-10-CM

## 2022-04-07 DIAGNOSIS — R0981 Nasal congestion: Secondary | ICD-10-CM | POA: Insufficient documentation

## 2022-04-07 DIAGNOSIS — R051 Acute cough: Secondary | ICD-10-CM | POA: Insufficient documentation

## 2022-04-07 DIAGNOSIS — R0989 Other specified symptoms and signs involving the circulatory and respiratory systems: Secondary | ICD-10-CM | POA: Diagnosis present

## 2022-04-07 DIAGNOSIS — Z7984 Long term (current) use of oral hypoglycemic drugs: Secondary | ICD-10-CM | POA: Insufficient documentation

## 2022-04-07 LAB — BASIC METABOLIC PANEL
Anion gap: 8 (ref 5–15)
BUN: 37 mg/dL — ABNORMAL HIGH (ref 8–23)
CO2: 23 mmol/L (ref 22–32)
Calcium: 9.2 mg/dL (ref 8.9–10.3)
Chloride: 109 mmol/L (ref 98–111)
Creatinine, Ser: 2.11 mg/dL — ABNORMAL HIGH (ref 0.61–1.24)
GFR, Estimated: 32 mL/min — ABNORMAL LOW (ref >60)
Glucose, Bld: 211 mg/dL — ABNORMAL HIGH (ref 70–99)
Potassium: 4.4 mmol/L (ref 3.5–5.1)
Sodium: 140 mmol/L (ref 135–145)

## 2022-04-07 LAB — RESP PANEL BY RT-PCR (FLU A&B, COVID) ARPGX2
Influenza A by PCR: NEGATIVE
Influenza A by PCR: NEGATIVE
Influenza B by PCR: NEGATIVE
Influenza B by PCR: NEGATIVE
SARS Coronavirus 2 by RT PCR: NEGATIVE
SARS Coronavirus 2 by RT PCR: NEGATIVE

## 2022-04-07 LAB — CBC
HCT: 39.9 % (ref 39.0–52.0)
Hemoglobin: 13 g/dL (ref 13.0–17.0)
MCH: 30.7 pg (ref 26.0–34.0)
MCHC: 32.6 g/dL (ref 30.0–36.0)
MCV: 94.3 fL (ref 80.0–100.0)
Platelets: 145 10*3/uL — ABNORMAL LOW (ref 150–400)
RBC: 4.23 MIL/uL (ref 4.22–5.81)
RDW: 13.1 % (ref 11.5–15.5)
WBC: 6.7 10*3/uL (ref 4.0–10.5)
nRBC: 0 % (ref 0.0–0.2)

## 2022-04-07 LAB — BRAIN NATRIURETIC PEPTIDE: B Natriuretic Peptide: 1766.8 pg/mL — ABNORMAL HIGH (ref 0.0–100.0)

## 2022-04-07 LAB — TROPONIN I (HIGH SENSITIVITY): Troponin I (High Sensitivity): 14 ng/L (ref <18)

## 2022-04-07 MED ORDER — ALBUTEROL SULFATE HFA 108 (90 BASE) MCG/ACT IN AERS: 2.0000 | INHALATION_SPRAY | Freq: Four times a day (QID) | RESPIRATORY_TRACT | 2 refills | Status: DC | PRN

## 2022-04-07 MED ORDER — IPRATROPIUM-ALBUTEROL 0.5-2.5 (3) MG/3ML IN SOLN
3.0000 mL | Freq: Once | RESPIRATORY_TRACT | Status: AC
  Administered 2022-04-07: 3 mL via RESPIRATORY_TRACT
  Filled 2022-04-07: qty 3

## 2022-04-07 MED ORDER — DEXAMETHASONE SODIUM PHOSPHATE 10 MG/ML IJ SOLN
10.0000 mg | Freq: Once | INTRAMUSCULAR | Status: AC
  Administered 2022-04-07: 10 mg via INTRAVENOUS
  Filled 2022-04-07: qty 1

## 2022-04-07 MED ORDER — FUROSEMIDE 10 MG/ML IJ SOLN
60.0000 mg | Freq: Once | INTRAMUSCULAR | Status: AC
  Administered 2022-04-07: 60 mg via INTRAVENOUS
  Filled 2022-04-07: qty 8

## 2022-04-07 NOTE — ED Triage Notes (Signed)
Pt from [place]- reports that pt has been having chest congestion, cough, sob, and increased swelling to abdomen since this am. Pt tested neg for flu and covid however has had close contact with someone with flu.

## 2022-04-07 NOTE — ED Notes (Signed)
Patient lying on stretcher with family at bedside upon discharge from ED. No distress noted. No questions or concerns voiced. IV removed at this time. D/c instructions given to patient and family and states they understand. Pt left ambulatory with family.

## 2022-04-07 NOTE — ED Notes (Signed)
Patient is being discharged from the Urgent Care and sent to the Emergency Department via Personal Vehicle . Per Jerene Pitch, [place], patient is in need of higher level of care due to Wheezing. Patient is aware and verbalizes understanding of plan of care.  Vitals:   04/07/22 0829  BP: 115/69  Pulse: 70  Resp: 18  Temp: 98 F (36.7 C)  SpO2: 96%

## 2022-04-07 NOTE — ED Provider Notes (Signed)
[facility]    CSN: [id number] Arrival date & time: 04/07/22  2353      History   Chief Complaint Chief Complaint  Patient presents with   Wheezing    HPI Jermaine Kramer is a 71 y.o. male with history of CHF, CAD, previous cardiac bypass surgery 14 months ago, diabetes, GERD, hypertension, hyperlipidemia, atrial fibrillation, previous stroke.  Patient presents today for feeling short of breath and having chest wall discomfort (with coughing, breathing), cough, sweats, and nasal congestion and drainage starting today.  Patient reports a lot of wheezing.  States he has wheezing off and on related to heart failure.  States he was not wheezing until today.  Reports he does not have any swelling in his extremities or any increased swelling in his abdomen.  He denies any weight changes.  He reports a couple family members are sick with the same symptoms.  He reports they are in the same household as he is.  One of the family members tested positive for influenza A in the clinic today.  HPI  Past Medical History:  Diagnosis Date   Arrhythmia    atrial fibrillation   CHF (congestive heart failure) ([place])    Coronary artery disease    Diabetes mellitus without complication ([place])    GERD (gastroesophageal reflux disease)    Hypercholesteremia    Hypertension    Sleep apnea    Stroke [facility])     Patient Active Problem List   Diagnosis Date Noted   Gastroenteritis and colitis, viral 10/05/2021   Acute gastroenteritis, rotavirus 10/04/2021   AKI (acute kidney injury) with metabolic acidosis(HCC) 61/44/3154   Diabetes mellitus without complication (HCC)    Hypokalemia    Hypotension    Dental caries    Acute pulmonary edema (HCC)    Left flank pain 07/24/2021   Stroke ([place]) 07/24/2021   Chronic combined systolic and diastolic congestive heart failure ([place]) 07/24/2021   Atrial fibrillation, chronic ([place]) 07/24/2021   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   Kidney stone  on left side 07/24/2021   HFrEF (heart failure with reduced ejection fraction) ([place]) 06/22/2021   Ischemic cardiomyopathy 06/22/2021   CAD s/p CABG x 3 06/22/2021   Hyperlipidemia LDL goal <70 06/22/2021   Acute CHF (congestive heart failure) ([place]) 05/14/2021   Incomplete emptying of bladder 05/04/2021   Acute respiratory failure with hypoxia (HCC)    Troponin I above reference range    COVID 04/21/2021   Postoperative atrial fibrillation ([place]) 04/21/2021   DM (diabetes mellitus), type 2 with coma ([place])    GERD (gastroesophageal reflux disease)    Hypertension    Acute parotitis 02/27/2021   Volume overload 02/19/2021   Thrombocytopenia ([place]) 02/18/2021   Receiving inotropic medication 02/18/2021   Postoperative pain 02/18/2021   On enteral nutrition 02/18/2021   Postoperative anemia due to acute blood loss 02/18/2021   Altered mental status 02/18/2021   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   Bradycardia 12/10/2020   Epididymo-orchitis 11/30/2020   Urinary retention 04/09/2020   Obesity (BMI 30.0-34.9) 05/14/2017   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   Renal cyst, acquired, left 12/22/2013   Neoplasm of uncertain behavior of penis 12/22/2013   Malignant neoplasm ([place]) 12/22/2013   History of urinary stone 12/22/2013   BPH (benign prostatic hyperplasia) 12/22/2013   Benign localized hyperplasia of prostate with urinary obstruction and other lower urinary tract symptoms (LUTS)(600.21) 12/22/2013    Past Surgical History:  Procedure Laterality Date   CATARACT EXTRACTION Bilateral 04/06/2022   CHOLECYSTECTOMY     COLONOSCOPY WITH PROPOFOL N/A 04/14/2019   Procedure: COLONOSCOPY WITH PROPOFOL;  Surgeon: Toledo, Benay Pike, MD;  Location: [facility];  Service: Gastroenterology;  Laterality: N/A;   CORONARY ARTERY BYPASS GRAFT  02/16/2021   ICD IMPLANT N/A 06/22/2021   Procedure: ICD IMPLANT;  Surgeon: Vickie Epley, MD;   Location: [facility];  Service: Cardiovascular;  Laterality: N/A;   KNEE ARTHROSCOPY     RENAL CYST EXCISION         Home Medications    Prior to Admission medications   Medication Sig Start Date End Date Taking? Authorizing Provider  amiodarone (PACERONE) 200 MG tablet Take 200 mg by mouth daily. 03/06/21  Yes [provider]  apixaban (ELIQUIS) 5 MG TABS tablet Take 1 tablet (5 mg total) by mouth 2 (two) times daily. 11/28/21  Yes Darylene Price A, FNP  aspirin EC 81 MG tablet Take 81 mg by mouth daily.   Yes [provider]  atorvastatin (LIPITOR) 80 MG tablet Take 1 tablet (80 mg total) by mouth daily. 01/10/22  Yes Hackney, Otila Kluver A, FNP  bisacodyl (DULCOLAX) 5 MG EC tablet Take 1 tablet (5 mg total) by mouth daily as needed for moderate constipation. 12/06/21 12/06/22 Yes Naaman Plummer, MD  calcitRIOL (ROCALTROL) 0.25 MCG capsule Take by mouth. 03/07/22 03/07/23 Yes [provider]  glucose blood (PRECISION QID TEST) test strip Use 3 (three) times daily Use as instructed. One touch ultra 07/05/21 07/05/22 Yes [provider]  lactulose (CHRONULAC) 10 GM/15ML solution Take 30 mLs (20 g total) by mouth 2 (two) times daily as needed for mild constipation. 12/02/21  Yes Merlyn Lot, MD  losartan (COZAAR) 25 MG tablet Take 0.5 tablets (12.5 mg total) by mouth daily. 01/24/22  Yes Hackney, Otila Kluver A, FNP  metFORMIN (GLUCOPHAGE) 1000 MG tablet Take 500 mg by mouth 2 (two) times daily with a meal.   Yes [provider]  metoprolol succinate (TOPROL-XL) 25 MG 24 hr tablet Take 0.5 tablets (12.5 mg total) by mouth every evening. 10/11/21  Yes Hackney, Otila Kluver A, FNP  omeprazole (PRILOSEC) 40 MG capsule Take 40 mg by mouth daily. 03/11/21  Yes [provider]  ondansetron (ZOFRAN) 4 MG tablet Take 4 mg by mouth every 8 (eight) hours as needed. 09/13/21  Yes [provider]  spironolactone (ALDACTONE) 25 MG tablet Take 12.5 mg by mouth daily.  09/20/21  Yes [provider]  tamsulosin (FLOMAX) 0.4 MG CAPS capsule Take 0.8 mg by mouth in the morning and at bedtime.   Yes [provider]  torsemide (DEMADEX) 20 MG tablet TAKE 1 TABLET (20 MG TOTAL) BY MOUTH DAILY. AND ADDITIONAL '20MG'$  IF NEEDED 03/15/22  Yes Alisa Graff, FNP    Family History Family History  Problem Relation Age of Onset   Parkinson's disease Mother    Lupus Mother    Heart disease Father     Social History Social History   Tobacco Use   Smoking status: Never   Smokeless tobacco: Never  Vaping Use   Vaping Use: Never used  Substance Use Topics   Alcohol use: Not Currently   Drug use: No     Allergies   Doxycycline, Heparin, and Empagliflozin   Review of Systems Review of Systems  Constitutional:  Positive for diaphoresis and fatigue. Negative for fever.  HENT:  Positive for congestion and rhinorrhea. Negative for sinus  pressure, sinus pain and sore throat.   Respiratory:  Positive for cough, shortness of breath and wheezing.   Cardiovascular:  Positive for chest pain.  Gastrointestinal:  Negative for abdominal pain, diarrhea, nausea and vomiting.  Musculoskeletal:  Negative for myalgias.  Neurological:  Negative for weakness, light-headedness and headaches.  Hematological:  Negative for adenopathy.     Physical Exam Triage Vital Signs ED Triage Vitals  Enc Vitals Group     BP      Pulse      Resp      Temp      Temp src      SpO2      Weight      Height      Head Circumference      Peak Flow      Pain Score      Pain Loc      Pain Edu?      Excl. in [place]?    No data found.  Updated Vital Signs BP 115/69 (BP Location: Left Arm)   Pulse 70   Temp 98 F (36.7 C) (Oral)   Resp 18   Ht '5\' 5"'$  (1.651 m)   Wt 185 lb (83.9 kg)   SpO2 96%   BMI 30.79 kg/m   Physical Exam Vitals and nursing note reviewed.  Constitutional:      General: He is not in acute distress.    Appearance: Normal appearance. He is  well-developed. He is not ill-appearing.  HENT:     Head: Normocephalic and atraumatic.     Nose: Congestion present.     Mouth/Throat:     Mouth: Mucous membranes are moist.     Pharynx: Oropharynx is clear.  Eyes:     General: No scleral icterus.    Conjunctiva/sclera: Conjunctivae normal.  Cardiovascular:     Rate and Rhythm: Normal rate and regular rhythm.     Heart sounds: Normal heart sounds.  Pulmonary:     Effort: Pulmonary effort is normal. No respiratory distress.     Breath sounds: Wheezing (RLL, RML, RUL wheezing as well as audible wheezing) present.  Abdominal:     General: Abdomen is protuberant. There is distension.     Tenderness: There is no abdominal tenderness.  Musculoskeletal:     Cervical back: Neck supple.     Right lower leg: No edema.     Left lower leg: No edema.  Skin:    General: Skin is warm and dry.     Capillary Refill: Capillary refill takes less than 2 seconds.  Neurological:     General: No focal deficit present.     Mental Status: He is alert. Mental status is at baseline.     Motor: No weakness.     Gait: Gait normal.  Psychiatric:        Mood and Affect: Mood normal.        Behavior: Behavior normal.      UC Treatments / Results  Labs (all labs ordered are listed, but only abnormal results are displayed) Labs Reviewed  RESP PANEL BY RT-PCR (FLU A&B, COVID) [id number]    EKG   Radiology DG Chest 2 View  Result Date: 04/07/2022 CLINICAL DATA:  Cough, wheezing. EXAM: CHEST - 2 VIEW COMPARISON:  October 03, 2021. FINDINGS: Stable cardiomegaly. Status post coronary bypass graft. Left-sided defibrillator is unchanged. Both lungs are clear. The visualized skeletal structures are unremarkable. IMPRESSION: No active cardiopulmonary disease. Electronically Signed   By: Jeneen Rinks  Murlean Caller M.D.   On: 04/07/2022 09:20    Procedures ED EKG  Date/Time: 04/07/2022 9:32 AM  Performed by: Danton Clap, PA-C Authorized by: Danton Clap, PA-C    Interpretation:    Interpretation: abnormal   Quality:    Tracing quality:  Limited by artifact Rate:    ECG rate:  70 Rhythm:    Rhythm: sinus rhythm   QRS:    QRS conduction: LBBB and RBBB   ST segments:    ST segments:  Normal T waves:    T waves: normal   Comments:     Normal sinus rhythm with right bundle branch block, left anterior fascicular block  (including critical care time)  Medications Ordered in UC Medications - No data to display  Initial Impression / Assessment and Plan / UC Course  I have reviewed the triage vital signs and the nursing notes.  Pertinent labs & imaging results that were available during my care of the patient were reviewed by me and considered in my medical decision making (see chart for details).   71 year old male with significant medical history for CHF, CAD with previous cardiac bypass surgery over a year ago, atrial fibrillation, stroke, diabetes presents for 1 day history of cough, congestion and shortness of breath.  Also reports chest wall pain.  Reports wheezing for the past year or greater which has worsened today.  Vitals normal and stable patient is in no acute distress.  He is audibly wheezing and has wheezing of the entire right lung.  Reduced breath sounds on the left.  Abdomen is protuberant and firm.  He has no tenderness.  No swelling of lower extremities.  Chest x-ray obtained to assess for signs of CHF exacerbation.  Respiratory panel is negative for flu and COVID.  EKG obtained given history of cardiac conditions and complaint of chest pain today.  EKG today compared to 1 from August 2023.  Appears to now have a left anterior fascicular block.  This was not seen on the previous EKG.  The quality of the EKG is honestly not the best.  There is a lot of artifact.  X-ray shows stable cardiomegaly.  No obvious signs of pneumonia or edema.  Discussed result with patient and his wife.  High suspicion for influenza despite his  negative test and I also suspect this is causing him to have a flareup of his underlying CHF.  He does have a lot of abdominal swelling.  Previous BMP a couple months ago was greater than 1000.  Unable to check BNP at this office today.  We discussed having him go to the emergency department for further evaluation to have this test performed and treatment if he is indeed having an exacerbation.  He has been hospitalized in the past for exacerbation of CHF.  Patient's wife and patient are in agreement and patient will go to [facility] at this time. Patient's wife is taking him by private vehicle.  Leaving in stable condition with normal vital signs.   Final Clinical Impressions(s) / UC Diagnoses   Final diagnoses:  Acute on chronic congestive heart failure, unspecified heart failure type (HCC)  Wheezing  Exposure to influenza  Acute cough  Pleuritic pain     Discharge Instructions      -Your flu test was negative but given that your family's test is positive I think you do have the flu and it has caused a flareup of your heart failure.  I have advised emergency department  at this time.  You have been advised to follow up immediately in the emergency department for concerning signs.symptoms. If you declined EMS transport, please have a family member take you directly to the ED at this time. Do not delay. Based on concerns about condition, if you do not follow up in th e ED, you may risk poor outcomes including worsening of condition, delayed treatment and potentially life threatening issues. If you have declined to go to the ED at this time, you should call your PCP immediately to set up a follow up appointment.  Go to ED for red flag symptoms, including; fevers you cannot reduce with Tylenol/Motrin, severe headaches, vision changes, numbness/weakness in part of the body, lethargy, confusion, intractable vomiting, severe dehydration, chest pain, breathing difficulty, severe persistent abdominal or  pelvic pain, signs of severe infection (increased redness, swelling of an area), feeling faint or passing out, dizziness, etc. You should especially go to the ED for sudden acute worsening of condition if you do not elect to go at this time.      ED Prescriptions   None    PDMP not reviewed this encounter.   Danton Clap, PA-C 04/07/22 1014

## 2022-04-07 NOTE — ED Provider Notes (Signed)
Patient feels remarkably better after treatment with diuretics and steroids/albuterol. Has no complaints currently.  His viral swabs were negative and his BNP was about at his baseline.  Plan will be for discharge with albuterol at home he already got dexamethasone.  He will continue with his home medications and follow-up with his PMD next week.  He will return with any worsening or new symptoms.   Lucillie Garfinkel, MD 04/07/22 [phone]

## 2022-04-07 NOTE — Discharge Instructions (Signed)
-  Your flu test was negative but given that your family's test is positive I think you do have the flu and it has caused a flareup of your heart failure.  I have advised emergency department at this time.  You have been advised to follow up immediately in the emergency department for concerning signs.symptoms. If you declined EMS transport, please have a family member take you directly to the ED at this time. Do not delay. Based on concerns about condition, if you do not follow up in th e ED, you may risk poor outcomes including worsening of condition, delayed treatment and potentially life threatening issues. If you have declined to go to the ED at this time, you should call your PCP immediately to set up a follow up appointment.  Go to ED for red flag symptoms, including; fevers you cannot reduce with Tylenol/Motrin, severe headaches, vision changes, numbness/weakness in part of the body, lethargy, confusion, intractable vomiting, severe dehydration, chest pain, breathing difficulty, severe persistent abdominal or pelvic pain, signs of severe infection (increased redness, swelling of an area), feeling faint or passing out, dizziness, etc. You should especially go to the ED for sudden acute worsening of condition if you do not elect to go at this time.

## 2022-04-07 NOTE — ED Provider Notes (Signed)
[facility] Provider Note    Event Date/Time   First MD Initiated Contact with Patient 04/07/22 1350     (approximate)  History   Chief Complaint: chest congestion, Cough, and Shortness of Breath  HPI  Jermaine Kramer is a 71 y.o. male with a past medical history of CHF, CAD, diabetes, gastric reflux, hypertension, hyperlipidemia presents to the emergency department for chest congestion wheeze cough and shortness of breath.  According to the patient since this morning he has had nasal congestion cough shortness of breath.  He is also notes over the past week or so increased swelling to his abdomen.  Patient's grandchildren live with them 2 of which just tested positive for influenza.  Patient denies any fever.  Does state he has been wheezing somewhat today but denies any history of wheeze denies any history of albuterol use.  No chest pain.  Patient does have a history of CHF, follows up at the [facility] CHF clinic.  Wife states sometimes he has to go for IV Lasix when he gets short of breath.  Physical Exam   Triage Vital Signs: ED Triage Vitals  Enc Vitals Group     BP 04/07/22 1125 107/66     Pulse Rate 04/07/22 1125 69     Resp 04/07/22 1125 18     Temp 04/07/22 1125 98.6 F (37 C)     Temp Source 04/07/22 1125 Oral     SpO2 04/07/22 1125 96 %     Weight 04/07/22 1122 182 lb 15.7 oz (83 kg)     Height 04/07/22 1122 '5\' 5"'$  (1.651 m)     Head Circumference --      Peak Flow --      Pain Score 04/07/22 1122 0     Pain Loc --      Pain Edu? --      Excl. in [place]? --     Most recent vital signs: Vitals:   04/07/22 1125  BP: 107/66  Pulse: 69  Resp: 18  Temp: 98.6 F (37 C)  SpO2: 96%    General: Awake, no distress.  Occasional cough during exam. CV:  Good peripheral perfusion.  Regular rate and rhythm  Resp:  Normal effort.  Mild expiratory wheeze bilaterally.  No rales or rhonchi. Abd:  No distention.  Soft, nontender.  No rebound or  guarding.   ED Results / Procedures / Treatments   RADIOLOGY  Outpatient chest x-ray performed showing no consolidation or pulmonary edema   MEDICATIONS ORDERED IN ED: Medications  ipratropium-albuterol (DUONEB) 0.5-2.5 (3) MG/3ML nebulizer solution 3 mL (has no administration in time range)  ipratropium-albuterol (DUONEB) 0.5-2.5 (3) MG/3ML nebulizer solution 3 mL (has no administration in time range)  dexamethasone (DECADRON) injection 10 mg (has no administration in time range)  furosemide (LASIX) injection 60 mg (has no administration in time range)     IMPRESSION / MDM / ASSESSMENT AND PLAN / ED COURSE  I reviewed the triage vital signs and the nursing notes.  Patient's presentation is most consistent with acute presentation with potential threat to life or bodily function.  Patient presents emergency department for nasal congestion, chest congestion cough and shortness of breath starting this morning.  2 family members with whom he lives have tested positive for influenza.  No fever.  Patient does have a history of CHF follows up at the clinic.  States sometimes when he is short of breath he benefits significantly from IV Lasix.  Here patient does have a slight expiratory wheeze.  Clear chest x-ray.  Initial negative COVID/flu test discussed with the patient that this could be negative for the first several days, we will recheck in the emergency department.  We will dose DuoNebs for slight wheeze and dose of one-time dose of Decadron.  Patient is diabetic and does not want to be on prednisone going home but I believe a one-time dose of Decadron given his slight wheeze is warranted to help with the shortness of breath and possible viral process.  Lab work in the emergency department shows no significant finding, reassuring CBC with a normal white blood cell count, reassuring chemistry of mild renal insufficiency unchanged from historical values.  I have added on a troponin and a BNP as a  precaution but again clear chest x-ray earlier today and satting 96% on room air.  we will reassess after Lasix, Decadron, DuoNebs.  If the patient is feeling better after treatment I anticipate the patient could likely be discharged home.  Patient care signed out to oncoming physician.  FINAL CLINICAL IMPRESSION(S) / ED DIAGNOSES   Congestion    Note:  This document was prepared using Dragon voice recognition software and may include unintentional dictation errors.   Harvest Dark, MD 04/07/22 [phone]

## 2022-04-07 NOTE — ED Triage Notes (Signed)
Pt c/o SOB, chest soreness, nasal drainage x1day wheezing x11month.   Pt states that he has congestive heart failure and is unsure if he has bronchitis  Pt states that he has been wheezing since he had a bypass in October

## 2022-04-13 NOTE — Progress Notes (Signed)
Remote ICD transmission.   

## 2022-04-15 ENCOUNTER — Other Ambulatory Visit: Payer: Self-pay | Admitting: Family

## 2022-04-17 ENCOUNTER — Ambulatory Visit: Payer: Medicare Other | Admitting: Family

## 2022-05-08 ENCOUNTER — Other Ambulatory Visit: Payer: Self-pay | Admitting: Family

## 2022-05-08 ENCOUNTER — Telehealth: Payer: Self-pay

## 2022-05-08 NOTE — Telephone Encounter (Signed)
Pt's spouse called to report patient used furoscix injection yesterday for weight gain and swelling. His weight is returned to normal now and he is feeling fine. He has a follow up appt with his primary cardiologist next week.  They're inquiring whether they need to follow up with Darylene Price this week, and to let Otila Kluver know they used their last furoscix injection.  Per [facility], there is no need to follow up in office this week with me. Advised patient they can get another sample of furoscix, next week, or as soon as samples are received in office, and we will resend a new furoscix order to determine if patient is able to get the medication at an affordable rate for 2024.   Pt verbalized understanding. Georg Ruddle, RN

## 2022-05-16 ENCOUNTER — Ambulatory Visit
Admission: RE | Admit: 2022-05-16 | Discharge: 2022-05-16 | Disposition: A | Discharge status: Home / Self Care | Payer: Medicare Other | Source: Ambulatory Visit | Attending: Cardiology | Admitting: Cardiology

## 2022-05-16 ENCOUNTER — Other Ambulatory Visit: Payer: Self-pay | Admitting: Family

## 2022-05-16 ENCOUNTER — Encounter: Payer: Self-pay | Admitting: Family

## 2022-05-16 ENCOUNTER — Other Ambulatory Visit: Payer: Self-pay | Admitting: Cardiology

## 2022-05-16 ENCOUNTER — Ambulatory Visit (HOSPITAL_BASED_OUTPATIENT_CLINIC_OR_DEPARTMENT_OTHER): Payer: Medicare Other | Admitting: Family

## 2022-05-16 ENCOUNTER — Ambulatory Visit
Admission: RE | Admit: 2022-05-16 | Discharge: 2022-05-16 | Disposition: A | Discharge status: Home / Self Care | Payer: Medicare Other | Source: Ambulatory Visit | Attending: Family | Admitting: Family

## 2022-05-16 VITALS — BP 101/58 | HR 72 | Resp 18 | Wt 195.0 lb

## 2022-05-16 DIAGNOSIS — N1832 Chronic kidney disease, stage 3b: Secondary | ICD-10-CM

## 2022-05-16 DIAGNOSIS — I10 Essential (primary) hypertension: Secondary | ICD-10-CM

## 2022-05-16 DIAGNOSIS — G4730 Sleep apnea, unspecified: Secondary | ICD-10-CM | POA: Insufficient documentation

## 2022-05-16 DIAGNOSIS — Z95810 Presence of automatic (implantable) cardiac defibrillator: Secondary | ICD-10-CM | POA: Insufficient documentation

## 2022-05-16 DIAGNOSIS — I5023 Acute on chronic systolic (congestive) heart failure: Secondary | ICD-10-CM | POA: Diagnosis present

## 2022-05-16 DIAGNOSIS — I4891 Unspecified atrial fibrillation: Secondary | ICD-10-CM | POA: Insufficient documentation

## 2022-05-16 DIAGNOSIS — Z951 Presence of aortocoronary bypass graft: Secondary | ICD-10-CM | POA: Insufficient documentation

## 2022-05-16 DIAGNOSIS — K219 Gastro-esophageal reflux disease without esophagitis: Secondary | ICD-10-CM | POA: Insufficient documentation

## 2022-05-16 DIAGNOSIS — Z7901 Long term (current) use of anticoagulants: Secondary | ICD-10-CM | POA: Insufficient documentation

## 2022-05-16 DIAGNOSIS — I509 Heart failure, unspecified: Secondary | ICD-10-CM | POA: Diagnosis present

## 2022-05-16 DIAGNOSIS — R140 Abdominal distension (gaseous): Secondary | ICD-10-CM | POA: Diagnosis present

## 2022-05-16 DIAGNOSIS — I480 Paroxysmal atrial fibrillation: Secondary | ICD-10-CM

## 2022-05-16 DIAGNOSIS — E1122 Type 2 diabetes mellitus with diabetic chronic kidney disease: Secondary | ICD-10-CM

## 2022-05-16 DIAGNOSIS — I2510 Atherosclerotic heart disease of native coronary artery without angina pectoris: Secondary | ICD-10-CM

## 2022-05-16 DIAGNOSIS — I5043 Acute on chronic combined systolic (congestive) and diastolic (congestive) heart failure: Secondary | ICD-10-CM | POA: Insufficient documentation

## 2022-05-16 DIAGNOSIS — Z8673 Personal history of transient ischemic attack (TIA), and cerebral infarction without residual deficits: Secondary | ICD-10-CM | POA: Insufficient documentation

## 2022-05-16 DIAGNOSIS — I130 Hypertensive heart and chronic kidney disease with heart failure and stage 1 through stage 4 chronic kidney disease, or unspecified chronic kidney disease: Secondary | ICD-10-CM | POA: Insufficient documentation

## 2022-05-16 DIAGNOSIS — N189 Chronic kidney disease, unspecified: Secondary | ICD-10-CM | POA: Insufficient documentation

## 2022-05-16 LAB — BASIC METABOLIC PANEL
Anion gap: 6 (ref 5–15)
BUN: 37 mg/dL — ABNORMAL HIGH (ref 8–23)
CO2: 25 mmol/L (ref 22–32)
Calcium: 8.5 mg/dL — ABNORMAL LOW (ref 8.9–10.3)
Chloride: 104 mmol/L (ref 98–111)
Creatinine, Ser: 2.04 mg/dL — ABNORMAL HIGH (ref 0.61–1.24)
GFR, Estimated: 34 mL/min — ABNORMAL LOW (ref >60)
Glucose, Bld: 147 mg/dL — ABNORMAL HIGH (ref 70–99)
Potassium: 3.9 mmol/L (ref 3.5–5.1)
Sodium: 135 mmol/L (ref 135–145)

## 2022-05-16 LAB — BRAIN NATRIURETIC PEPTIDE: B Natriuretic Peptide: 1177.1 pg/mL — ABNORMAL HIGH (ref 0.0–100.0)

## 2022-05-16 MED ORDER — FUROSEMIDE 10 MG/ML IJ SOLN
INTRAMUSCULAR | Status: AC
  Administered 2022-05-16: 80 mg via INTRAVENOUS
  Filled 2022-05-16: qty 8

## 2022-05-16 MED ORDER — POTASSIUM CHLORIDE CRYS ER 20 MEQ PO TBCR
EXTENDED_RELEASE_TABLET | ORAL | Status: AC
  Administered 2022-05-16: 40 meq via ORAL
  Filled 2022-05-16: qty 2

## 2022-05-16 MED ORDER — FUROSEMIDE 10 MG/ML IJ SOLN: 80.0000 mg | Freq: Once | INTRAMUSCULAR | Status: AC

## 2022-05-16 MED ORDER — POTASSIUM CHLORIDE CRYS ER 20 MEQ PO TBCR: 40.0000 meq | EXTENDED_RELEASE_TABLET | Freq: Once | ORAL | Status: AC

## 2022-05-16 NOTE — Progress Notes (Signed)
Patient ID: Jermaine Kramer, male    DOB: [birth year], 72 y.o.   MRN: [id number]  HPI  Mr Kotlyar is a 72 y/o male with a history of leukemia, DM, CAD (CABG 02/16/21), AF, sleep apnea, hyperlipidemia, HTN, stroke, GERD & chronic heart failure.   Echo report from 09/16/21 reviewed and showed an EF of 30-35% along with mild MR. Echo report from 05/16/21 reviewed and showed and EF of <20% along with mild/moderate LAE and mild/moderate MR.   Was in the ED 04/07/22 due to cough/ SOB. Was in the ED 12/06/21 due to LLQ abdominal pain along with watery bowel movements. CT of the abdomen and pelvis without IV contrast shows a moderate amount of stool throughout the colon as well as nonobstructing left renal calculi with no evidence of ureteral calculus or hydronephrosis. Was in the ED 12/02/21 due to LLQ abdominal pain. CT imaging without any evidence of obstruction or ileus. No signs of UTI. Symptoms improved and he was released.  He presents today for an urgent visit with a chief complaint of moderate SOB with exertion. Says this has been chronic although has been worsening over the last couple of weeks. Has associated fatigue, chest tightness, abdominal distention (worsening), difficulty sleeping, weakness and weight gain along with this. Denies any palpitations, pedal edema, chest pain, cough or dizziness.   Saw cardiology earlier today. Just came from having abdominal ultrasound done but was told there wasn't any fluid to draw off.   Admits that over the holiday season, they didn't watch his sodium intake as much as they usually did.   Past Medical History:  Diagnosis Date   Arrhythmia    atrial fibrillation   CHF (congestive heart failure) (HCC)    Coronary artery disease    Diabetes mellitus without complication (HCC)    GERD (gastroesophageal reflux disease)    Hypercholesteremia    Hypertension    Sleep apnea    Stroke [facility])    Past Surgical History:  Procedure Laterality Date   CATARACT  EXTRACTION Bilateral 04/06/2022   CHOLECYSTECTOMY     COLONOSCOPY WITH PROPOFOL N/A 04/14/2019   Procedure: COLONOSCOPY WITH PROPOFOL;  Surgeon: Toledo, Benay Pike, MD;  Location: [facility] ENDOSCOPY;  Service: Gastroenterology;  Laterality: N/A;   CORONARY ARTERY BYPASS GRAFT  02/16/2021   ICD IMPLANT N/A 06/22/2021   Procedure: ICD IMPLANT;  Surgeon: Vickie Epley, MD;  Location: [facility];  Service: Cardiovascular;  Laterality: N/A;   KNEE ARTHROSCOPY     RENAL CYST EXCISION     Family History  Problem Relation Age of Onset   Parkinson's disease Mother    Lupus Mother    Heart disease Father    Social History   Tobacco Use   Smoking status: Never   Smokeless tobacco: Never  Substance Use Topics   Alcohol use: Not Currently   Allergies  Allergen Reactions   Doxycycline Anaphylaxis    Patient does not recall having this reaction.     Heparin Other (See Comments)    heparin-induced thrombocytopenia    Empagliflozin Itching    Groin infection Caused a groin infection.     Prior to Admission medications   Medication Sig Start Date End Date Taking? Authorizing Provider  albuterol (VENTOLIN HFA) 108 (90 Base) MCG/ACT inhaler Inhale 2 puffs into the lungs every 6 (six) hours as needed for wheezing or shortness of breath. 04/07/22  Yes Paduchowski, Lennette Bihari, MD  apixaban (ELIQUIS) 5 MG TABS tablet Take 1 tablet (  5 mg total) by mouth 2 (two) times daily. 11/28/21  Yes Darylene Price A, FNP  aspirin EC 81 MG tablet Take 81 mg by mouth daily.   Yes [provider]  atorvastatin (LIPITOR) 80 MG tablet Take 1 tablet (80 mg total) by mouth daily. 01/10/22  Yes Clifton Kovacic, Otila Kluver A, FNP  calcitRIOL (ROCALTROL) 0.25 MCG capsule Take by mouth. 03/07/22 03/07/23 Yes [provider]  glucose blood (PRECISION QID TEST) test strip Use 3 (three) times daily Use as instructed. One touch ultra 07/05/21 07/05/22 Yes [provider]  losartan (COZAAR) 25 MG tablet TAKE 1/2  TABLET BY MOUTH EVERY DAY 05/08/22  Yes Darylene Price A, FNP  metFORMIN (GLUCOPHAGE) 1000 MG tablet Take 500 mg by mouth 2 (two) times daily with a meal.   Yes [provider]  metoprolol succinate (TOPROL-XL) 25 MG 24 hr tablet Take 0.5 tablets (12.5 mg total) by mouth every evening. 10/11/21  Yes Haneef Hallquist, Otila Kluver A, FNP  omeprazole (PRILOSEC) 40 MG capsule Take 40 mg by mouth daily. 03/11/21  Yes [provider]  ondansetron (ZOFRAN) 4 MG tablet Take 4 mg by mouth every 8 (eight) hours as needed. 09/13/21  Yes [provider]  spironolactone (ALDACTONE) 25 MG tablet Take 12.5 mg by mouth daily. 09/20/21  Yes [provider]  tamsulosin (FLOMAX) 0.4 MG CAPS capsule Take 0.8 mg by mouth in the morning and at bedtime.   Yes [provider]  torsemide (DEMADEX) 20 MG tablet TAKE 1 TABLET (20 MG TOTAL) BY MOUTH DAILY. AND ADDITIONAL '20MG'$  IF NEEDED 04/15/22  Yes Darylene Price A, FNP  amiodarone (PACERONE) 200 MG tablet Take 200 mg by mouth daily. Patient not taking: Reported on 05/16/2022 03/06/21   [provider]  bisacodyl (DULCOLAX) 5 MG EC tablet Take 1 tablet (5 mg total) by mouth daily as needed for moderate constipation. Patient not taking: Reported on 05/16/2022 12/06/21 12/06/22  Naaman Plummer, MD  lactulose (CHRONULAC) 10 GM/15ML solution Take 30 mLs (20 g total) by mouth 2 (two) times daily as needed for mild constipation. Patient not taking: Reported on 05/16/2022 12/02/21   Merlyn Lot, MD   Review of Systems  Constitutional:  Positive for fatigue. Negative for appetite change.  HENT:  Negative for congestion, postnasal drip and sore throat.   Eyes: Negative.   Respiratory:  Positive for chest tightness and shortness of breath (worsening). Negative for cough.   Cardiovascular:  Negative for chest pain, palpitations and leg swelling.  Gastrointestinal:  Positive for abdominal distention (worsening last 2-3 weeks). Negative for abdominal pain  and constipation.  Endocrine: Negative.   Genitourinary: Negative.   Musculoskeletal:  Negative for back pain.  Skin: Negative.   Allergic/Immunologic: Negative.   Neurological:  Positive for weakness. Negative for dizziness and light-headedness.  Hematological:  Negative for adenopathy. Does not bruise/bleed easily.  Psychiatric/Behavioral:  Positive for sleep disturbance (sleeping on 2 pillows waking up frequently due urination). Negative for dysphoric mood. The patient is not nervous/anxious.    Vitals:   05/16/22 1059  BP: (!) 101/58  Pulse: 72  Resp: 18  SpO2: 97%  Weight: 195 lb (88.5 kg)   Wt Readings from Last 3 Encounters:  05/16/22 195 lb (88.5 kg)  04/07/22 182 lb 15.7 oz (83 kg)  04/07/22 185 lb (83.9 kg)   Lab Results  Component Value Date   CREATININE 2.11 (H) 04/07/2022   CREATININE 2.10 (H) 02/08/2022   CREATININE 2.00 (H) 01/31/2022   Physical Exam  Vitals and nursing note reviewed. Exam conducted with a chaperone present (wife).  Constitutional:      Appearance: Normal appearance.  HENT:     Head: Normocephalic and atraumatic.  Cardiovascular:     Rate and Rhythm: Normal rate and regular rhythm.  Pulmonary:     Effort: Pulmonary effort is normal. No respiratory distress.     Breath sounds: No wheezing or rales.  Abdominal:     General: There is distension.     Tenderness: There is no abdominal tenderness.  Musculoskeletal:        General: No tenderness.     Cervical back: Normal range of motion and neck supple.     Right lower leg: No edema.     Left lower leg: No edema.  Skin:    General: Skin is warm and dry.  Neurological:     General: No focal deficit present.     Mental Status: He is alert and oriented to person, place, and time.  Psychiatric:        Mood and Affect: Mood normal.        Behavior: Behavior normal.        Thought Content: Thought content normal.   Assessment & Plan:  1: Acute on Chronic heart failure with reduced  ejection fraction- - NYHA class III - fluid overloaded with weight gain and worsening symptoms - weighing daily & home weight chart reviewed; reminded to call for an overnight weight gain of > 2 pounds or a weekly weight gain of >5 pounds - last used furoscix 05/07/22 - weight up 7 pounds from last visit here 2.5 months ago  - send for '80mg'$  IV lasix/ 56mq PO potassium - BMP/ BNP today - ReDs clip reading today was elevated at 46% - not adding salt and has been reading food labels for sodium content although says that over the holidays, they didn't watch it as closely; understands to keep daily sodium intake to '2000mg'$  / day - on GDMT of metoprolol, spironolactone & losartan - allergic to empagliflozin with a yeast infection - saw EP (Quentin Ore 09/21/21  - had AICD implanted 06/22/21; no shocks have been delivered - BNP 04/07/22 was 1766.8 - had abdominal ultrasound done earlier today but says that there wasn't any fluid to pull off - has received his flu vaccine for this season - PharmD reconciled medications with the patient  2: HTN- - BP 101/58 - saw PCP ([name] 04/13/22; returns 05/25/21 - BMP 04/07/22 reviewed and showed sodium 140, potassium 4.4, creatinine 2.11 & GFR 32  3: DM with CKD- - A1c 02/20/22 was 6.9% - fasting glucose at home today was 200 - taking metformin  - saw nephrology (Holley Raring 03/09/22  4: Atrial fibrillation- - saw cardiology (Margarito Courser 05/16/22 - currently on metoprolol and apixaban  5: CAD- - CABG with a LIMA to LAD, SVG to ramus, and SVG to PDA on 02/16/21 - finished cardiac rehab [place]   Medication list reviewed.   Return in 2 days, sooner if needed.

## 2022-05-16 NOTE — Discharge Instructions (Signed)
Keep follow up appointment with [facility].  If shortness of breath or tightness in chest worsen please go to the emergency department.

## 2022-05-16 NOTE — Progress Notes (Signed)
ReDS Vest / Clip - 05/16/22 1200       ReDS Vest / Clip   Station Marker C    ReDS Actual Value 46

## 2022-05-16 NOTE — Patient Instructions (Signed)
Continue weighing daily and call for an overnight weight gain of 3 pounds or more or a weekly weight gain of more than 5 pounds.   If you have voicemail, please make sure your mailbox is cleaned out so that we may leave a message and please make sure to listen to any voicemails.     

## 2022-05-17 NOTE — Progress Notes (Signed)
Patient ID: Jermaine Kramer, male    DOB: 1951-01-26, 72 y.o.   MRN: [id number]  HPI  Jermaine Kramer is a 72 y/o male with a history of leukemia, DM, CAD (CABG 02/16/21), AF, sleep apnea, hyperlipidemia, HTN, stroke, GERD & chronic heart failure.   Echo report from 09/16/21 reviewed and showed an EF of 30-35% along with mild Jermaine. Echo report from 05/16/21 reviewed and showed and EF of <20% along with mild/moderate LAE and mild/moderate Jermaine.   LHC 02/10/21:  2v CAD, including 100% midRCA, 70% midLAD with 80% focal distal LAD, 70% D1  Elevated LVEDP (7mHg)   Cardiac MRI 01/27/21:  1. The left ventricle is mildly dilated.  Wall thickness is normal.     a.  The inferior wall is akinetic.     b.  The basal septum is severely hypokinetic.  The mid-distal septum is moderately hypokinetic.     c.  The basal anterior wall is severely hypokinetic. The mid-distal anterior wall and apex are  moderately hypokinetic.     d.  The lateral wall is moderately. hypokinetic.   Global LV systolic function is severely reduced with an LVEF calculated at 30%.    2. The right ventricle is normal in cavity size and wall thickness.  There is mild global hypokinesis with  mildly reduced RV systolic function.  The RVEF is calculated at 43%.   Was in the ED 04/07/22 due to cough/ SOB. Was in the ED 12/06/21 due to LLQ abdominal pain along with watery bowel movements. CT of the abdomen and pelvis without IV contrast shows a moderate amount of stool throughout the colon as well as nonobstructing left renal calculi with no evidence of ureteral calculus or hydronephrosis. Was in the ED 12/02/21 due to LLQ abdominal pain. CT imaging without any evidence of obstruction or ileus. No signs of UTI. Symptoms improved and he was released.  He presents today for a follow-up visit with a chief complaint of moderate shortness of breath with little exertion. Has associated fatigue, chest tightness, abdominal distention, weakness and difficulty  sleeping along with this. Denies any palpitations, pedal edema, chest pain, cough, dizziness or weight gain.   Received '80mg'$  IV lasix/ 422m PO potassium 2 days ago but doesn't feel any better. Has not taken his torsemide yet today.   Past Medical History:  Diagnosis Date   Arrhythmia    atrial fibrillation   CHF (congestive heart failure) (HCC)    Coronary artery disease    Diabetes mellitus without complication (HCC)    GERD (gastroesophageal reflux disease)    Hypercholesteremia    Hypertension    Sleep apnea    [facility]   Past Surgical History:  Procedure Laterality Date   CATARACT EXTRACTION Bilateral 04/06/2022   CHOLECYSTECTOMY     COLONOSCOPY WITH PROPOFOL N/A 04/14/2019   Procedure: COLONOSCOPY WITH PROPOFOL;  Surgeon: Toledo, TeBenay PikeMD;  Location: [facility] ENDOSCOPY;  Service: Gastroenterology;  Laterality: N/A;   CORONARY ARTERY BYPASS GRAFT  02/16/2021   ICD IMPLANT N/A 06/22/2021   Procedure: ICD IMPLANT;  Surgeon: LaVickie EpleyMD;  [facility];  Service: Cardiovascular;  Laterality: N/A;   KNEE ARTHROSCOPY     RENAL CYST EXCISION      Family History  Problem Relation Age of Onset   Parkinson's disease Mother    Lupus Mother    Heart disease Father    Social History   Tobacco Use   Smoking status:  Never   Smokeless tobacco: Never  Substance Use Topics   Alcohol use: Not Currently   Allergies  Allergen Reactions   Doxycycline Anaphylaxis    Patient does not recall having this reaction.     Heparin Other (See Comments)    heparin-induced thrombocytopenia    Empagliflozin Itching    Groin infection Caused a groin infection.     Prior to Admission medications   Medication Sig Start Date End Date Taking? Authorizing Provider  albuterol (VENTOLIN HFA) 108 (90 Base) MCG/ACT inhaler Inhale 2 puffs into the lungs every 6 (six) hours as needed for wheezing or shortness of breath. 04/07/22  Yes Harvest Dark, MD   apixaban (ELIQUIS) 5 MG TABS tablet Take 1 tablet (5 mg total) by mouth 2 (two) times daily. 11/28/21  Yes Darylene Price A, FNP  aspirin EC 81 MG tablet Take 81 mg by mouth daily.   Yes [provider]  atorvastatin (LIPITOR) 80 MG tablet Take 1 tablet (80 mg total) by mouth daily. 01/10/22  Yes Claryssa Sandner, Otila Kluver A, FNP  calcitRIOL (ROCALTROL) 0.25 MCG capsule Take 0.25 mcg by mouth daily. 03/07/22 03/07/23 Yes [provider]  losartan (COZAAR) 25 MG tablet TAKE 1/2 TABLET BY MOUTH EVERY DAY 05/08/22  Yes Darylene Price A, FNP  metFORMIN (GLUCOPHAGE) 1000 MG tablet Take 500 mg by mouth 2 (two) times daily with a meal.   Yes [provider]  metoprolol succinate (TOPROL-XL) 25 MG 24 hr tablet Take 0.5 tablets (12.5 mg total) by mouth every evening. 10/11/21  Yes Analys Ryden, Otila Kluver A, FNP  omeprazole (PRILOSEC) 40 MG capsule Take 40 mg by mouth daily. 03/11/21  Yes [provider]  ondansetron (ZOFRAN) 4 MG tablet Take 4 mg by mouth every 8 (eight) hours as needed. 09/13/21  Yes [provider]  spironolactone (ALDACTONE) 25 MG tablet Take 12.5 mg by mouth daily. 09/20/21  Yes [provider]  tamsulosin (FLOMAX) 0.4 MG CAPS capsule Take 0.4 mg by mouth in the morning and at bedtime.   Yes [provider]  torsemide (DEMADEX) 20 MG tablet TAKE 1 TABLET (20 MG TOTAL) BY MOUTH DAILY. AND ADDITIONAL '20MG'$  IF NEEDED 04/15/22  Yes Darylene Price A, FNP  amiodarone (PACERONE) 200 MG tablet Take 200 mg by mouth daily. Patient not taking: Reported on 05/16/2022 03/06/21   [provider]  glucose blood (PRECISION QID TEST) test strip Use 3 (three) times daily Use as instructed. One touch ultra 07/05/21 07/05/22  [provider]    Review of Systems  Constitutional:  Positive for fatigue. Negative for appetite change.  HENT:  Negative for congestion, postnasal drip and sore throat.   Eyes: Negative.   Respiratory:  Positive for chest tightness and  shortness of breath (easily). Negative for cough.   Cardiovascular:  Negative for chest pain, palpitations and leg swelling.  Gastrointestinal:  Positive for abdominal distention (unchanged from 2 days ago). Negative for abdominal pain and constipation.  Endocrine: Negative.   Genitourinary: Negative.   Musculoskeletal:  Negative for back pain.  Skin: Negative.   Allergic/Immunologic: Negative.   Neurological:  Positive for weakness. Negative for dizziness and light-headedness.  Hematological:  Negative for adenopathy. Does not bruise/bleed easily.  Psychiatric/Behavioral:  Positive for sleep disturbance (sleeping on 2 pillows waking up frequently due urination). Negative for dysphoric mood. The patient is not nervous/anxious.    Vitals:   05/18/22 0915  BP: (!) 108/58  Pulse: 70  Resp: 18  SpO2: 100%  Weight: 193  lb 4 oz (87.7 kg)   Wt Readings from Last 3 Encounters:  05/18/22 193 lb 4 oz (87.7 kg)  05/16/22 195 lb (88.5 kg)  04/07/22 182 lb 15.7 oz (83 kg)   Lab Results  Component Value Date   CREATININE 2.04 (H) 05/16/2022   CREATININE 2.11 (H) 04/07/2022   CREATININE 2.10 (H) 02/08/2022    Physical Exam Vitals and nursing note reviewed. Exam conducted with a chaperone present (wife).  Constitutional:      Appearance: Normal appearance.  HENT:     Head: Normocephalic and atraumatic.  Cardiovascular:     Rate and Rhythm: Normal rate and regular rhythm.  Pulmonary:     Effort: Pulmonary effort is normal. No respiratory distress.     Breath sounds: No wheezing or rales.  Abdominal:     General: There is distension.     Tenderness: There is no abdominal tenderness.  Musculoskeletal:        General: No tenderness.     Cervical back: Normal range of motion and neck supple.     Right lower leg: No edema.     Left lower leg: No edema.  Skin:    General: Skin is warm and dry.  Neurological:     General: No focal deficit present.     Mental Status: He is alert and  oriented to person, place, and time.  Psychiatric:        Mood and Affect: Mood normal.        Behavior: Behavior normal.        Thought Content: Thought content normal.   Assessment & Plan:  1: Acute on Chronic heart failure with reduced ejection fraction- - NYHA class III - fluid overloaded with continued symptoms - weighing daily & home weight chart reviewed; reminded to call for an overnight weight gain of > 2 pounds or a weekly weight gain of >5 pounds - last used furoscix 05/07/22 - weight down 2 pounds from last visit here 2 days ago  - received '80mg'$  IV lasix/ 58mq PO potassium 2 days ago but says that his symptoms aren't any better - BMP/ BNP today - ReDs clip reading 2 days ago was elevated at 46%; today is higher at 49% - since he hasn't taken his diuretic today, will given furoscix and then tomorrow, he will resume his torsemide - will call after getting lab results back today to see if he needs potassium supplementation or not - not adding salt and had been reading food labels for sodium content although says that they have not been as diligent with sodium "lately"; recently ate pizza, chicken & dumplings and he's been eating frozen biscuits - they understand to decrease the consumption of those foods especially right now - on GDMT of metoprolol, spironolactone & losartan - allergic to empagliflozin with a yeast infection - BP may limit changing losartan to entresto  - saw EP (Quentin Ore 09/21/21  - had AICD implanted 06/22/21; no shocks have been delivered - BNP 05/16/22 was 1177.1 - has received his flu vaccine for this season - PharmD reconciled medications with the patient - discussed referral to [facility] MD for further evaluation  2: HTN- - BP 108/58 - saw PCP ([name] 04/13/22; returns 05/25/21 - BMP 05/16/22 reviewed and showed sodium 135, potassium 3.9, creatinine 2.04 & GFR 34  3: DM with CKD- - A1c 02/20/22 was 6.9% - fasting glucose at home today was 190 - taking  metformin  - saw nephrology (Holley Raring 03/09/22  4: Atrial fibrillation- - saw cardiology Margarito Courser) 05/16/22 - currently on metoprolol and apixaban  5: CAD- - CABG with a LIMA to LAD, SVG to ramus, and SVG to PDA on 02/16/21 - finished cardiac rehab   Medication list reviewed.   Wife to call us back tomorrow to update on weight/ symptoms so that we can make a plan for the weekend. Return in 4 days, sooner if needed.

## 2022-05-18 ENCOUNTER — Ambulatory Visit (HOSPITAL_BASED_OUTPATIENT_CLINIC_OR_DEPARTMENT_OTHER): Payer: Medicare Other | Admitting: Family

## 2022-05-18 ENCOUNTER — Other Ambulatory Visit (HOSPITAL_COMMUNITY): Payer: Self-pay

## 2022-05-18 ENCOUNTER — Encounter: Payer: Self-pay | Admitting: Pharmacy Technician

## 2022-05-18 ENCOUNTER — Encounter: Payer: Self-pay | Admitting: Family

## 2022-05-18 ENCOUNTER — Other Ambulatory Visit
Admission: RE | Admit: 2022-05-18 | Discharge: 2022-05-18 | Disposition: A | Discharge status: Home / Self Care | Payer: Medicare Other | Source: Ambulatory Visit | Attending: Family | Admitting: Family

## 2022-05-18 VITALS — BP 108/58 | HR 70 | Resp 18 | Wt 193.2 lb

## 2022-05-18 DIAGNOSIS — G4730 Sleep apnea, unspecified: Secondary | ICD-10-CM | POA: Insufficient documentation

## 2022-05-18 DIAGNOSIS — N1832 Chronic kidney disease, stage 3b: Secondary | ICD-10-CM

## 2022-05-18 DIAGNOSIS — Z91148 Patient's other noncompliance with medication regimen for other reason: Secondary | ICD-10-CM | POA: Insufficient documentation

## 2022-05-18 DIAGNOSIS — I480 Paroxysmal atrial fibrillation: Secondary | ICD-10-CM | POA: Diagnosis not present

## 2022-05-18 DIAGNOSIS — I2510 Atherosclerotic heart disease of native coronary artery without angina pectoris: Secondary | ICD-10-CM

## 2022-05-18 DIAGNOSIS — N189 Chronic kidney disease, unspecified: Secondary | ICD-10-CM | POA: Insufficient documentation

## 2022-05-18 DIAGNOSIS — K219 Gastro-esophageal reflux disease without esophagitis: Secondary | ICD-10-CM | POA: Insufficient documentation

## 2022-05-18 DIAGNOSIS — Z7901 Long term (current) use of anticoagulants: Secondary | ICD-10-CM | POA: Insufficient documentation

## 2022-05-18 DIAGNOSIS — I5023 Acute on chronic systolic (congestive) heart failure: Secondary | ICD-10-CM | POA: Diagnosis not present

## 2022-05-18 DIAGNOSIS — E1122 Type 2 diabetes mellitus with diabetic chronic kidney disease: Secondary | ICD-10-CM

## 2022-05-18 DIAGNOSIS — Z8673 Personal history of transient ischemic attack (TIA), and cerebral infarction without residual deficits: Secondary | ICD-10-CM | POA: Insufficient documentation

## 2022-05-18 DIAGNOSIS — Z95810 Presence of automatic (implantable) cardiac defibrillator: Secondary | ICD-10-CM | POA: Insufficient documentation

## 2022-05-18 DIAGNOSIS — I10 Essential (primary) hypertension: Secondary | ICD-10-CM

## 2022-05-18 DIAGNOSIS — Z951 Presence of aortocoronary bypass graft: Secondary | ICD-10-CM | POA: Insufficient documentation

## 2022-05-18 DIAGNOSIS — I130 Hypertensive heart and chronic kidney disease with heart failure and stage 1 through stage 4 chronic kidney disease, or unspecified chronic kidney disease: Secondary | ICD-10-CM | POA: Insufficient documentation

## 2022-05-18 DIAGNOSIS — I5043 Acute on chronic combined systolic (congestive) and diastolic (congestive) heart failure: Secondary | ICD-10-CM | POA: Insufficient documentation

## 2022-05-18 DIAGNOSIS — Z888 Allergy status to other drugs, medicaments and biological substances status: Secondary | ICD-10-CM | POA: Insufficient documentation

## 2022-05-18 DIAGNOSIS — I4891 Unspecified atrial fibrillation: Secondary | ICD-10-CM | POA: Insufficient documentation

## 2022-05-18 LAB — BRAIN NATRIURETIC PEPTIDE: B Natriuretic Peptide: 1106.3 pg/mL — ABNORMAL HIGH (ref 0.0–100.0)

## 2022-05-18 LAB — BASIC METABOLIC PANEL
Anion gap: 9 (ref 5–15)
BUN: 36 mg/dL — ABNORMAL HIGH (ref 8–23)
CO2: 22 mmol/L (ref 22–32)
Calcium: 8.9 mg/dL (ref 8.9–10.3)
Chloride: 106 mmol/L (ref 98–111)
Creatinine, Ser: 1.98 mg/dL — ABNORMAL HIGH (ref 0.61–1.24)
GFR, Estimated: 35 mL/min — ABNORMAL LOW (ref >60)
Glucose, Bld: 272 mg/dL — ABNORMAL HIGH (ref 70–99)
Potassium: 4.7 mmol/L (ref 3.5–5.1)
Sodium: 137 mmol/L (ref 135–145)

## 2022-05-18 NOTE — Patient Instructions (Addendum)
Continue weighing daily and call for an overnight weight gain of 3 pounds or more or a weekly weight gain of more than 5 pounds.   If you have voicemail, please make sure your mailbox is cleaned out so that we may leave a message and please make sure to listen to any voicemails.    Use furoscix today and then resume your torsemide tomorrow. I will call you after I get your lab results back regarding if you need any potassium supplements.    Call me tomorrow with an update of weight, blood pressure and symptoms.

## 2022-05-18 NOTE — Progress Notes (Signed)
ReDS Vest / Clip - 05/18/22 0900       ReDS Vest / Clip   Station Marker C    Ruler Value 32    ReDS Actual Value 49

## 2022-05-18 NOTE — Progress Notes (Signed)
[place] - PHARMACIST COUNSELING NOTE  Guideline-Directed Medical Therapy/Evidence Based Medicine  ACE/ARB/ARNI: Losartan 12.5 mg daily Beta Blocker: Metoprolol succinate 12.5 mg daily Aldosterone Antagonist: Spironolactone 25 mg daily Diuretic: Torsemide 20-40 mg daily SGLT2i:  None  Adherence Assessment  Do you ever forget to take your medication? '[]'$ Yes '[x]'$ No  Do you ever skip doses due to side effects? '[]'$ Yes '[x]'$ No  Do you have trouble affording your medicines? '[x]'$ Yes '[]'$ No  Are you ever unable to pick up your medication due to transportation difficulties? '[]'$ Yes '[x]'$ No  Do you ever stop taking your medications because you don't believe they are helping? '[]'$ Yes '[x]'$ No  Do you check your weight daily? '[x]'$ Yes '[]'$ No   Adherence strategy: Uses a pill box and patient's wife helps  Barriers to obtaining medications: Cost with brand name medications (specifically Eliquis) when patient gets into donut hole  Vital signs: HR 70, BP 108/58, weight (pounds) 193 ECHO: Date 08/2021, EF 30-35%     Latest Ref Rng & Units 05/16/2022    1:58 PM 04/07/2022   11:24 AM 02/08/2022    9:31 AM  BMP  Glucose 70 - 99 mg/dL 147  211    BUN 8 - 23 mg/dL 37  37    Creatinine 0.61 - 1.24 mg/dL 2.04  2.11  2.10   Sodium 135 - 145 mmol/L 135  140    Potassium 3.5 - 5.1 mmol/L 3.9  4.4    Chloride 98 - 111 mmol/L 104  109    CO2 22 - 32 mmol/L 25  23    Calcium 8.9 - 10.3 mg/dL 8.5  9.2      Past Medical History:  Diagnosis Date   Arrhythmia    atrial fibrillation   CHF (congestive heart failure) (HCC)    Coronary artery disease    Diabetes mellitus without complication (HCC)    GERD (gastroesophageal reflux disease)    Hypercholesteremia    Hypertension    Sleep apnea    Stroke [facility])     ASSESSMENT 72 year old male with PMH HTN, CAD s/p CABG, 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 Most recent ECHO in 08/2021 shows EF 30-35%. Regarding GDMT, patient takes spironolactone 25 mg daily, losartan 12.5 mg daily, Toprol XL 12.5 mg daily, and Torsemide 20 mg daily with additional 20 mg PRN for swelling. Last time patient needed extra torsemide dose was 05/15/2022. He used to be on Jardiance which was discontinued after patient developed yeast infection. Patient was here on 05/16/2022 and ended up needing IV Lasix due to SOB and elevated ReDS. Patient still reporting SOB and wife notes that although he has lost 2 lbs, this is not as much weight loss as they were expecting.  Recent ED Visit (past 6 months): Date - 03/2022, CC - AoCHF Date - 12/02/2021, CC - LLQ abdominal pain Date - 12/02/2021, CC - LLQ abdominal pain Date - 09/2021, CC - Weakness / rotavirus  PLAN CHF/HTN Continue spironolactone, losartan, Toprol XL, and torsemide Recommend against SGLT2i due to yeast infection on Jardiance  CAD s/p CABGx3 (01/2021) 07/2021 LDL 42 Continue atorvastatin, bASA  T2DM 05/2022 A1c 7.8% Continue metformin 500 mg twice daily Patient to discuss with PCP next week about possibly increasing the metformin back to 1 g twice daily Informed patient and wife that his eGFR 34 is nearing the threshold eGFR 30 below which we  would stop metformin Recommend considering Ozempic for benefit in diabetes, CV protection, and weight loss Requires prior authorization  Time spent: 15 minutes  Will M. Ouida Sills, PharmD PGY-1 Pharmacy Resident 05/18/2022 11:00 AM   Current Outpatient Medications:    albuterol (VENTOLIN HFA) 108 (90 Base) MCG/ACT inhaler, Inhale 2 puffs into the lungs every 6 (six) hours as needed for wheezing or shortness of breath., Disp: 8 g, Rfl: 2   amiodarone (PACERONE) 200 MG tablet, Take 200 mg by mouth daily. (Patient not taking: Reported on 05/16/2022), Disp: , Rfl:    apixaban (ELIQUIS) 5 MG TABS tablet, Take 1 tablet (5 mg total) by mouth 2 (two) times daily., Disp: 180  tablet, Rfl: 3   aspirin EC 81 MG tablet, Take 81 mg by mouth daily., Disp: , Rfl:    atorvastatin (LIPITOR) 80 MG tablet, Take 1 tablet (80 mg total) by mouth daily., Disp: 30 tablet, Rfl: 5   calcitRIOL (ROCALTROL) 0.25 MCG capsule, Take 0.25 mcg by mouth daily., Disp: , Rfl:    glucose blood (PRECISION QID TEST) test strip, Use 3 (three) times daily Use as instructed. One touch ultra, Disp: , Rfl:    losartan (COZAAR) 25 MG tablet, TAKE 1/2 TABLET BY MOUTH EVERY DAY, Disp: 45 tablet, Rfl: 2   metFORMIN (GLUCOPHAGE) 1000 MG tablet, Take 500 mg by mouth 2 (two) times daily with a meal., Disp: , Rfl:    metoprolol succinate (TOPROL-XL) 25 MG 24 hr tablet, Take 0.5 tablets (12.5 mg total) by mouth every evening., Disp: 45 tablet, Rfl: 3   omeprazole (PRILOSEC) 40 MG capsule, Take 40 mg by mouth daily., Disp: , Rfl:    ondansetron (ZOFRAN) 4 MG tablet, Take 4 mg by mouth every 8 (eight) hours as needed., Disp: , Rfl:    spironolactone (ALDACTONE) 25 MG tablet, Take 12.5 mg by mouth daily., Disp: , Rfl:    tamsulosin (FLOMAX) 0.4 MG CAPS capsule, Take 0.4 mg by mouth in the morning and at bedtime., Disp: , Rfl:    torsemide (DEMADEX) 20 MG tablet, TAKE 1 TABLET (20 MG TOTAL) BY MOUTH DAILY. AND ADDITIONAL '20MG'$  IF NEEDED, Disp: 40 tablet, Rfl: 5    DRUGS TO CAUTION IN HEART FAILURE  Drug or Class Mechanism  Analgesics NSAIDs COX-2 inhibitors Glucocorticoids  Sodium and water retention, increased systemic vascular resistance, decreased response to diuretics   Diabetes Medications Metformin Thiazolidinediones Rosiglitazone (Avandia) Pioglitazone (Actos) DPP4 Inhibitors Saxagliptin (Onglyza) Sitagliptin (Januvia)   Lactic acidosis Possible calcium channel blockade   Unknown  Antiarrhythmics Class I  Flecainide Disopyramide Class III Sotalol Other Dronedarone  Negative inotrope, proarrhythmic   Proarrhythmic, beta blockade  Negative inotrope  Antihypertensives Alpha  Blockers Doxazosin Calcium Channel Blockers Diltiazem Verapamil Nifedipine Central Alpha Adrenergics Moxonidine Peripheral Vasodilators Minoxidil  Increases renin and aldosterone  Negative inotrope    Possible sympathetic withdrawal  Unknown  Anti-infective Itraconazole Amphotericin B  Negative inotrope Unknown  Hematologic Anagrelide Cilostazol   Possible inhibition of PD IV Inhibition of PD III causing arrhythmias  Neurologic/Psychiatric Stimulants Anti-Seizure Drugs Carbamazepine Pregabalin Antidepressants Tricyclics Citalopram Parkinsons Bromocriptine Pergolide Pramipexole Antipsychotics Clozapine Antimigraine Ergotamine Methysergide Appetite suppressants Bipolar Lithium  Peripheral alpha and beta agonist activity  Negative inotrope and chronotrope Calcium channel blockade  Negative inotrope, proarrhythmic Dose-dependent QT prolongation  Excessive serotonin activity/valvular damage Excessive serotonin activity/valvular damage Unknown  IgE mediated hypersensitivy, calcium channel blockade  Excessive serotonin activity/valvular damage Excessive serotonin activity/valvular damage Valvular damage  Direct myofibrillar degeneration, adrenergic stimulation  Antimalarials Chloroquine  Hydroxychloroquine Intracellular inhibition of lysosomal enzymes  Urologic Agents Alpha Blockers Doxazosin Prazosin Tamsulosin Terazosin  Increased renin and aldosterone  Adapted from Page Carleene Overlie, et al. "Drugs That May Cause or Exacerbate Heart Failure: A Scientific Statement from the American Heart  Association." Circulation 2016; 292:K46-K86. DOI: 10.1161/CIR.0000000000000426   MEDICATION ADHERENCES TIPS AND STRATEGIES Taking medication as prescribed improves patient outcomes in heart failure (reduces hospitalizations, improves symptoms, increases survival) Side effects of medications can be managed by decreasing doses, switching agents, stopping drugs, or  adding additional therapy. Please let someone in the [facility] know if you have having bothersome side effects so we can modify your regimen. Do not alter your medication regimen without talking to [place].  Medication reminders can help patients remember to take drugs on time. If you are missing or forgetting doses you can try linking behaviors, using pill boxes, or an electronic reminder like an alarm on your phone or an app. Some people can also get automated phone calls as medication reminders.

## 2022-05-19 ENCOUNTER — Telehealth: Payer: Self-pay | Admitting: Family

## 2022-05-19 NOTE — Telephone Encounter (Signed)
Patient's wife called to say that he used furoscix yesterday without much results. BP this morning was 116/83, HR 68, glucose 189 and weight is the same as yesterday.   Advised to take 2 torsemide today and 2 torsemide tomorrow. He sees the [facility] provider on Monday 05/22/22. She verbalized understanding.

## 2022-05-22 ENCOUNTER — Ambulatory Visit: Payer: Medicare Other | Attending: Cardiology | Admitting: Cardiology

## 2022-05-22 ENCOUNTER — Encounter: Payer: Self-pay | Admitting: Cardiology

## 2022-05-22 ENCOUNTER — Encounter: Payer: Self-pay | Admitting: *Deleted

## 2022-05-22 VITALS — BP 102/58 | HR 69 | Wt 191.4 lb

## 2022-05-22 DIAGNOSIS — I5022 Chronic systolic (congestive) heart failure: Secondary | ICD-10-CM | POA: Diagnosis not present

## 2022-05-22 DIAGNOSIS — R0602 Shortness of breath: Secondary | ICD-10-CM | POA: Insufficient documentation

## 2022-05-22 DIAGNOSIS — I2510 Atherosclerotic heart disease of native coronary artery without angina pectoris: Secondary | ICD-10-CM | POA: Insufficient documentation

## 2022-05-22 DIAGNOSIS — I130 Hypertensive heart and chronic kidney disease with heart failure and stage 1 through stage 4 chronic kidney disease, or unspecified chronic kidney disease: Secondary | ICD-10-CM | POA: Diagnosis not present

## 2022-05-22 DIAGNOSIS — Z7982 Long term (current) use of aspirin: Secondary | ICD-10-CM | POA: Insufficient documentation

## 2022-05-22 DIAGNOSIS — I255 Ischemic cardiomyopathy: Secondary | ICD-10-CM | POA: Insufficient documentation

## 2022-05-22 DIAGNOSIS — Z95810 Presence of automatic (implantable) cardiac defibrillator: Secondary | ICD-10-CM | POA: Diagnosis not present

## 2022-05-22 DIAGNOSIS — R0600 Dyspnea, unspecified: Secondary | ICD-10-CM | POA: Diagnosis present

## 2022-05-22 DIAGNOSIS — R0789 Other chest pain: Secondary | ICD-10-CM | POA: Insufficient documentation

## 2022-05-22 DIAGNOSIS — Z951 Presence of aortocoronary bypass graft: Secondary | ICD-10-CM | POA: Diagnosis not present

## 2022-05-22 DIAGNOSIS — I480 Paroxysmal atrial fibrillation: Secondary | ICD-10-CM | POA: Diagnosis not present

## 2022-05-22 DIAGNOSIS — E1122 Type 2 diabetes mellitus with diabetic chronic kidney disease: Secondary | ICD-10-CM | POA: Diagnosis not present

## 2022-05-22 DIAGNOSIS — Z7901 Long term (current) use of anticoagulants: Secondary | ICD-10-CM | POA: Insufficient documentation

## 2022-05-22 DIAGNOSIS — R0609 Other forms of dyspnea: Secondary | ICD-10-CM | POA: Diagnosis not present

## 2022-05-22 DIAGNOSIS — I5023 Acute on chronic systolic (congestive) heart failure: Secondary | ICD-10-CM | POA: Diagnosis not present

## 2022-05-22 DIAGNOSIS — Z7984 Long term (current) use of oral hypoglycemic drugs: Secondary | ICD-10-CM | POA: Insufficient documentation

## 2022-05-22 DIAGNOSIS — Z006 Encounter for examination for normal comparison and control in clinical research program: Secondary | ICD-10-CM

## 2022-05-22 DIAGNOSIS — Z79899 Other long term (current) drug therapy: Secondary | ICD-10-CM | POA: Insufficient documentation

## 2022-05-22 DIAGNOSIS — N1832 Chronic kidney disease, stage 3b: Secondary | ICD-10-CM | POA: Insufficient documentation

## 2022-05-22 MED ORDER — TORSEMIDE 20 MG PO TABS: 40.0000 mg | ORAL_TABLET | Freq: Every day | ORAL | 3 refills | Status: DC

## 2022-05-22 MED ORDER — SPIRONOLACTONE 25 MG PO TABS: 25.0000 mg | ORAL_TABLET | Freq: Every day | ORAL | 3 refills | Status: DC

## 2022-05-22 NOTE — Research (Signed)
Analog Informed Consent   Subject Name: Jermaine Kramer  Subject met inclusion and exclusion criteria.  The informed consent form, study requirements and expectations were reviewed with the subject and questions and concerns were addressed prior to the signing of the consent form.  The subject verbalized understanding of the trial requirements.  The subject agreed to participate in the Analog trial and signed the informed consent at 1110 on 05/22/2022.  The informed consent was obtained prior to performance of any protocol-specific procedures for the subject.  A copy of the signed informed consent was given to the subject and a copy was placed in the subject's medical record.   [name] D

## 2022-05-22 NOTE — Patient Instructions (Signed)
Medication Changes:  STOP Aspirin  INCREASE Torsemide to 40 mg (2 tabs) Daily  INCREASE Spironolactone to 25 mg (1 tab) Daily  Lab Work:  Your physician recommends that you return for lab work in: 1 week, please go to: Nature conservation officer at [facility] 1st desk on the right to check in (REGISTRATION)  Lab hours: Monday- Friday (7:30 am- 5:30 pm)  Testing/Procedures:  none  Referrals:  none  Special Instructions // Education:  Do the following things EVERYDAY: Weigh yourself in the morning before breakfast. Write it down and keep it in a log. Take your medicines as prescribed Eat low salt foods--Limit salt (sodium) to 2000 mg per day.  Stay as active as you can everyday Limit all fluids for the day to less than 2 liters   Follow-Up in: 3 weeks    If you have any questions or concerns before your next appointment please send [place] a message through mychart or call our office at [phone] Monday-Friday 8 am-5 pm.   If you have an urgent need after hours on the weekend please call your Primary Cardiologist or the [facility] in [place] at [phone].

## 2022-05-22 NOTE — Progress Notes (Addendum)
PCP: Sherre Scarlet, PA-C Cardiology: [facility] HF Cardiology: Dr. Aundra Dubin  72 y.o. with history of CAD, paroxysmal atrial fibrillation, CKD, and ischemic cardiomyopathy presents for followup of CHF.  Patient had CABG x 3 in 10/22. LV function has remained low since that time, ischemic CMP.  Most recent echo in 5/23 showed EF 30-35%, severe LV dilation, mild MR, low normal RV systolic function. He has a Medtronic ICD. He has paroxysmal atrial fibrillation but is generally in NSR.  Amiodarone has been stopped.  He has struggled with volume overload and has been given Furoscix from this clinic at times.  This is complicated by CKD stage 3b. He follows with nephrology, Dr. Holley Raring.   Patient reports ongoing exertional dyspnea.  This is chronic.  He was short of breath walking into the office today. His weight has been trending up generally in recent weeks.  No palpitations, he is in NSR.  He has 2 pillow orthopnea.  No lightheadedness.  He has chronic chest tightness that has been present since his CABG, not exertional.   ECG (personally reviewed): A-paced, IVCD 164 msec  Medtronic device interrogation: No AF/VT.   Labs (1/24): BNP 1106, K 4.7, creatinine 1.98  PMH: 1. ANA/anti-dsDNA positive: No other findings of rheumatological disease.  2. Type 2 diabetes 3. CAD: s/p CABG 10/22 with LIMA-LAD, SVG-ramus, SVG-PDA.   4. Atrial fibrillation: Paroxysmal 5. OSA 6. HTN 7. H/o CVA 8. CKD stage 3 9. Ischemic cardiomyopathy: Echo (5/23) with EF 30-35%, severe LV dilation, mild MR, low normal RV systolic function. He has a Medtronic ICD.  - Severe groin yeast infection on Jardiance.  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  Social History   Socioeconomic History   Marital status: Married    Spouse name: Not on file   Number of children: Not on file   Years of education: Not on file   Highest education level: Not on file  Occupational History   Not on file  Tobacco Use   Smoking status: Never    Smokeless tobacco: Never  Vaping Use   Vaping Use: Never used  Substance and Sexual Activity   Alcohol use: Not Currently   Drug use: No   Sexual activity: Not on file  Other Topics Concern   Not on file  Social History Narrative   Not on file   Social Determinants of Health   Financial Resource Strain: Not on file  Food Insecurity: Not on file  Transportation Needs: Not on file  Physical Activity: Not on file  Stress: Not on file  Social Connections: Not on file  Intimate Partner Violence: Not on file   Family History  Problem Relation Age of Onset   Parkinson's disease Mother    Lupus Mother    Heart disease Father    ROS: All systems reviewed and negative except as per HPI.   Current Outpatient Medications  Medication Sig Dispense Refill   apixaban (ELIQUIS) 5 MG TABS tablet Take 1 tablet (5 mg total) by mouth 2 (two) times daily. 180 tablet 3   atorvastatin (LIPITOR) 80 MG tablet Take 1 tablet (80 mg total) by mouth daily. 30 tablet 5   calcitRIOL (ROCALTROL) 0.25 MCG capsule Take 0.25 mcg by mouth daily.     glucose blood (PRECISION QID TEST) test strip Use 3 (three) times daily Use as instructed. One touch ultra     losartan (COZAAR) 25 MG tablet TAKE 1/2 TABLET BY MOUTH EVERY DAY 45 tablet 2   metFORMIN (  GLUCOPHAGE) 1000 MG tablet Take 500 mg by mouth 2 (two) times daily with a meal.     metoprolol succinate (TOPROL-XL) 25 MG 24 hr tablet Take 0.5 tablets (12.5 mg total) by mouth every evening. 45 tablet 3   omeprazole (PRILOSEC) 40 MG capsule Take 40 mg by mouth daily.     ondansetron (ZOFRAN) 4 MG tablet Take 4 mg by mouth every 8 (eight) hours as needed.     tamsulosin (FLOMAX) 0.4 MG CAPS capsule Take 0.4 mg by mouth in the morning and at bedtime.     spironolactone (ALDACTONE) 25 MG tablet Take 1 tablet (25 mg total) by mouth daily. 30 tablet 3   torsemide (DEMADEX) 20 MG tablet Take 2 tablets (40 mg total) by mouth daily. And additional '20mg'$  if needed 60 tablet  3   No current facility-administered medications for this visit.   BP (!) 102/58   Pulse 69   Wt 191 lb 6.4 oz (86.8 kg)   SpO2 99%   BMI 31.85 kg/m  General: NAD Neck: JVP 12 cm, no thyromegaly or thyroid nodule.  Lungs: Clear to auscultation bilaterally with normal respiratory effort. CV: Nondisplaced PMI.  Heart regular S1/S2, no S3/S4, no murmur.  No edema.  No carotid bruit.  Normal pedal pulses.  Abdomen: Soft, nontender, no hepatosplenomegaly, mild distention.  Skin: Intact without lesions or rashes.  Neurologic: Alert and oriented x 3.  Psych: Normal affect. Extremities: No clubbing or cyanosis.  HEENT: Normal.   Assessment/Plan: 1. CAD: s/p CABG x 3 in 10/22.  He has chronic atypical chest pain that I think is musculoskeletal.  No exertional chest pain.  - Continue atorvastatin 80 mg daily.  - As he is on apixaban and is a couple years out from CABG, think he can stop ASA.  2. Chronic systolic CHF: Ischemic cardiomyopathy.  Echo (5/23) with EF 30-35%, severe LV dilation, mild MR, low normal RV systolic function. He has a Medtronic ICD.  He has struggled with volume overload, complicated by CKD stage 3b.  On exam today, he is volume overloaded.  NYHA class III symptoms.  - Increase torsemide to 40 mg daily. BMET/BNP in 1 week.  - Increase spironolactone to 25 mg daily.  - If creatinine and BP remain stable, transition from losartan to Entresto at next appt.  - Continue Toprol XL 12.5 mg daily.  - He had a severe groin yeast infection shortly after starting Jardiance so will not rechallenge with SGLT2 inhibitor.  - He has been consented for the Analog trial for HF volume monitoring.  - Set up for repeat echo at next appt.  - He has a wide IVCD (164 msec) on ECG, would consider CRT upgrade (discuss with Dr. Quentin Ore, not classic LBBB).  3. CKD: stage 3b.  Follows with nephrology.  Last creatinine 1.98, stable.  - Follow BMET carefully with diuresis and med titration.  4.  Atrial fibrillation: Paroxysmal.  He is in NSR today, device interrogation showed no recent AF.  He is now off amiodarone.  - Continue apixaban  - If he has recurrent AF, would favor ablation.  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   Followup in 3 wks with me.   Loralie Champagne 05/22/2022

## 2022-05-26 ENCOUNTER — Encounter: Payer: Self-pay | Admitting: Cardiology

## 2022-05-29 ENCOUNTER — Other Ambulatory Visit
Admission: RE | Admit: 2022-05-29 | Discharge: 2022-05-29 | Disposition: A | Discharge status: Home / Self Care | Payer: Medicare Other | Attending: Cardiology | Admitting: Cardiology

## 2022-05-29 DIAGNOSIS — I5022 Chronic systolic (congestive) heart failure: Secondary | ICD-10-CM

## 2022-05-29 LAB — BASIC METABOLIC PANEL
Anion gap: 12 (ref 5–15)
BUN: 48 mg/dL — ABNORMAL HIGH (ref 8–23)
CO2: 23 mmol/L (ref 22–32)
Calcium: 9.3 mg/dL (ref 8.9–10.3)
Chloride: 104 mmol/L (ref 98–111)
Creatinine, Ser: 2.07 mg/dL — ABNORMAL HIGH (ref 0.61–1.24)
GFR, Estimated: 33 mL/min — ABNORMAL LOW (ref >60)
Glucose, Bld: 190 mg/dL — ABNORMAL HIGH (ref 70–99)
Potassium: 4.4 mmol/L (ref 3.5–5.1)
Sodium: 139 mmol/L (ref 135–145)

## 2022-05-29 LAB — BRAIN NATRIURETIC PEPTIDE: B Natriuretic Peptide: 729.9 pg/mL — ABNORMAL HIGH (ref 0.0–100.0)

## 2022-06-09 ENCOUNTER — Other Ambulatory Visit: Payer: Self-pay

## 2022-06-09 ENCOUNTER — Encounter: Payer: Self-pay | Admitting: Internal Medicine

## 2022-06-09 ENCOUNTER — Emergency Department: Payer: Medicare Other

## 2022-06-09 ENCOUNTER — Inpatient Hospital Stay
Admission: EM | Admit: 2022-06-09 | Discharge: 2022-06-11 | DRG: 392 | Disposition: A | Discharge status: Home / Self Care | Payer: Medicare Other | Attending: Internal Medicine | Admitting: Internal Medicine

## 2022-06-09 DIAGNOSIS — E871 Hypo-osmolality and hyponatremia: Secondary | ICD-10-CM | POA: Diagnosis present

## 2022-06-09 DIAGNOSIS — E1122 Type 2 diabetes mellitus with diabetic chronic kidney disease: Secondary | ICD-10-CM | POA: Diagnosis present

## 2022-06-09 DIAGNOSIS — Z881 Allergy status to other antibiotic agents status: Secondary | ICD-10-CM

## 2022-06-09 DIAGNOSIS — Z95810 Presence of automatic (implantable) cardiac defibrillator: Secondary | ICD-10-CM

## 2022-06-09 DIAGNOSIS — E669 Obesity, unspecified: Secondary | ICD-10-CM | POA: Diagnosis present

## 2022-06-09 DIAGNOSIS — R1084 Generalized abdominal pain: Secondary | ICD-10-CM | POA: Diagnosis not present

## 2022-06-09 DIAGNOSIS — N390 Urinary tract infection, site not specified: Secondary | ICD-10-CM | POA: Diagnosis present

## 2022-06-09 DIAGNOSIS — Z8249 Family history of ischemic heart disease and other diseases of the circulatory system: Secondary | ICD-10-CM

## 2022-06-09 DIAGNOSIS — N400 Enlarged prostate without lower urinary tract symptoms: Secondary | ICD-10-CM | POA: Diagnosis present

## 2022-06-09 DIAGNOSIS — Z79899 Other long term (current) drug therapy: Secondary | ICD-10-CM

## 2022-06-09 DIAGNOSIS — Z87442 Personal history of urinary calculi: Secondary | ICD-10-CM

## 2022-06-09 DIAGNOSIS — R1032 Left lower quadrant pain: Secondary | ICD-10-CM

## 2022-06-09 DIAGNOSIS — K5289 Other specified noninfective gastroenteritis and colitis: Secondary | ICD-10-CM | POA: Diagnosis not present

## 2022-06-09 DIAGNOSIS — Z820 Family history of epilepsy and other diseases of the nervous system: Secondary | ICD-10-CM

## 2022-06-09 DIAGNOSIS — Z6831 Body mass index (BMI) 31.0-31.9, adult: Secondary | ICD-10-CM

## 2022-06-09 DIAGNOSIS — I130 Hypertensive heart and chronic kidney disease with heart failure and stage 1 through stage 4 chronic kidney disease, or unspecified chronic kidney disease: Secondary | ICD-10-CM | POA: Diagnosis present

## 2022-06-09 DIAGNOSIS — N179 Acute kidney failure, unspecified: Secondary | ICD-10-CM | POA: Diagnosis present

## 2022-06-09 DIAGNOSIS — K219 Gastro-esophageal reflux disease without esophagitis: Secondary | ICD-10-CM | POA: Diagnosis present

## 2022-06-09 DIAGNOSIS — E119 Type 2 diabetes mellitus without complications: Secondary | ICD-10-CM

## 2022-06-09 DIAGNOSIS — Z832 Family history of diseases of the blood and blood-forming organs and certain disorders involving the immune mechanism: Secondary | ICD-10-CM

## 2022-06-09 DIAGNOSIS — N1832 Chronic kidney disease, stage 3b: Secondary | ICD-10-CM | POA: Diagnosis present

## 2022-06-09 DIAGNOSIS — Z8673 Personal history of transient ischemic attack (TIA), and cerebral infarction without residual deficits: Secondary | ICD-10-CM

## 2022-06-09 DIAGNOSIS — R109 Unspecified abdominal pain: Secondary | ICD-10-CM

## 2022-06-09 DIAGNOSIS — N200 Calculus of kidney: Secondary | ICD-10-CM | POA: Diagnosis present

## 2022-06-09 DIAGNOSIS — Z951 Presence of aortocoronary bypass graft: Secondary | ICD-10-CM

## 2022-06-09 DIAGNOSIS — E872 Acidosis, unspecified: Secondary | ICD-10-CM | POA: Diagnosis not present

## 2022-06-09 DIAGNOSIS — Z8719 Personal history of other diseases of the digestive system: Secondary | ICD-10-CM

## 2022-06-09 DIAGNOSIS — E66811 Obesity, class 1: Secondary | ICD-10-CM | POA: Diagnosis present

## 2022-06-09 DIAGNOSIS — Z7984 Long term (current) use of oral hypoglycemic drugs: Secondary | ICD-10-CM

## 2022-06-09 DIAGNOSIS — I2510 Atherosclerotic heart disease of native coronary artery without angina pectoris: Secondary | ICD-10-CM | POA: Diagnosis present

## 2022-06-09 DIAGNOSIS — G4730 Sleep apnea, unspecified: Secondary | ICD-10-CM | POA: Diagnosis present

## 2022-06-09 DIAGNOSIS — R7989 Other specified abnormal findings of blood chemistry: Secondary | ICD-10-CM | POA: Insufficient documentation

## 2022-06-09 DIAGNOSIS — I5022 Chronic systolic (congestive) heart failure: Secondary | ICD-10-CM | POA: Diagnosis present

## 2022-06-09 DIAGNOSIS — E1169 Type 2 diabetes mellitus with other specified complication: Secondary | ICD-10-CM

## 2022-06-09 DIAGNOSIS — I482 Chronic atrial fibrillation, unspecified: Secondary | ICD-10-CM | POA: Diagnosis present

## 2022-06-09 DIAGNOSIS — Z888 Allergy status to other drugs, medicaments and biological substances status: Secondary | ICD-10-CM

## 2022-06-09 DIAGNOSIS — K5909 Other constipation: Secondary | ICD-10-CM | POA: Diagnosis present

## 2022-06-09 DIAGNOSIS — Z7901 Long term (current) use of anticoagulants: Secondary | ICD-10-CM

## 2022-06-09 DIAGNOSIS — E7800 Pure hypercholesterolemia, unspecified: Secondary | ICD-10-CM | POA: Diagnosis present

## 2022-06-09 LAB — BLOOD GAS, VENOUS
Acid-Base Excess: 1.1 mmol/L (ref 0.0–2.0)
Bicarbonate: 22.9 mmol/L (ref 20.0–28.0)
O2 Saturation: 88.3 %
Patient temperature: 37
pCO2, Ven: 28 mmHg — ABNORMAL LOW (ref 44–60)
pH, Ven: 7.52 — ABNORMAL HIGH (ref 7.25–7.43)
pO2, Ven: 53 mmHg — ABNORMAL HIGH (ref 32–45)

## 2022-06-09 LAB — URINALYSIS, ROUTINE W REFLEX MICROSCOPIC
Bacteria, UA: NONE SEEN
Bilirubin Urine: NEGATIVE
Glucose, UA: 150 mg/dL — AB
Hgb urine dipstick: NEGATIVE
Ketones, ur: NEGATIVE mg/dL
Nitrite: NEGATIVE
Protein, ur: NEGATIVE mg/dL
Specific Gravity, Urine: 1.016 (ref 1.005–1.030)
Squamous Epithelial / HPF: NONE SEEN /HPF (ref 0–5)
pH: 6 (ref 5.0–8.0)

## 2022-06-09 LAB — HEPATIC FUNCTION PANEL
ALT: 24 U/L (ref 0–44)
AST: 30 U/L (ref 15–41)
Albumin: 3.5 g/dL (ref 3.5–5.0)
Alkaline Phosphatase: 71 U/L (ref 38–126)
Bilirubin, Direct: 0.2 mg/dL (ref 0.0–0.2)
Indirect Bilirubin: 0.6 mg/dL (ref 0.3–0.9)
Total Bilirubin: 0.8 mg/dL (ref 0.3–1.2)
Total Protein: 6.8 g/dL (ref 6.5–8.1)

## 2022-06-09 LAB — CBC
HCT: 40.9 % (ref 39.0–52.0)
Hemoglobin: 13.8 g/dL (ref 13.0–17.0)
MCH: 30.7 pg (ref 26.0–34.0)
MCHC: 33.7 g/dL (ref 30.0–36.0)
MCV: 90.9 fL (ref 80.0–100.0)
Platelets: 190 10*3/uL (ref 150–400)
RBC: 4.5 MIL/uL (ref 4.22–5.81)
RDW: 12.8 % (ref 11.5–15.5)
WBC: 6.3 10*3/uL (ref 4.0–10.5)
nRBC: 0 % (ref 0.0–0.2)

## 2022-06-09 LAB — BASIC METABOLIC PANEL
Anion gap: 13 (ref 5–15)
BUN: 38 mg/dL — ABNORMAL HIGH (ref 8–23)
CO2: 18 mmol/L — ABNORMAL LOW (ref 22–32)
Calcium: 8.4 mg/dL — ABNORMAL LOW (ref 8.9–10.3)
Chloride: 94 mmol/L — ABNORMAL LOW (ref 98–111)
Creatinine, Ser: 2.48 mg/dL — ABNORMAL HIGH (ref 0.61–1.24)
GFR, Estimated: 27 mL/min — ABNORMAL LOW (ref >60)
Glucose, Bld: 293 mg/dL — ABNORMAL HIGH (ref 70–99)
Potassium: 4.9 mmol/L (ref 3.5–5.1)
Sodium: 125 mmol/L — ABNORMAL LOW (ref 135–145)

## 2022-06-09 LAB — CK: Total CK: 21 U/L — ABNORMAL LOW (ref 49–397)

## 2022-06-09 LAB — LIPASE, BLOOD: Lipase: 57 U/L — ABNORMAL HIGH (ref 11–51)

## 2022-06-09 LAB — CBG MONITORING, ED: Glucose-Capillary: 144 mg/dL — ABNORMAL HIGH (ref 70–99)

## 2022-06-09 LAB — LACTIC ACID, PLASMA: Lactic Acid, Venous: 2.8 mmol/L (ref 0.5–1.9)

## 2022-06-09 MED ORDER — SODIUM CHLORIDE 0.9 % IV BOLUS
500.0000 mL | Freq: Once | INTRAVENOUS | Status: AC
  Administered 2022-06-09: 500 mL via INTRAVENOUS

## 2022-06-09 MED ORDER — ACETAMINOPHEN 325 MG PO TABS
650.0000 mg | ORAL_TABLET | Freq: Four times a day (QID) | ORAL | Status: DC | PRN
  Administered 2022-06-10 (×2): 650 mg via ORAL
  Filled 2022-06-09 (×2): qty 2

## 2022-06-09 MED ORDER — INSULIN ASPART 100 UNIT/ML IJ SOLN: 0.0000 [IU] | Freq: Three times a day (TID) | INTRAMUSCULAR | Status: DC

## 2022-06-09 MED ORDER — MORPHINE SULFATE (PF) 2 MG/ML IV SOLN
2.0000 mg | Freq: Once | INTRAVENOUS | Status: AC
  Administered 2022-06-09: 2 mg via INTRAVENOUS
  Filled 2022-06-09: qty 1

## 2022-06-09 MED ORDER — INSULIN ASPART 100 UNIT/ML IJ SOLN: 0.0000 [IU] | Freq: Every day | INTRAMUSCULAR | Status: DC

## 2022-06-09 MED ORDER — ONDANSETRON HCL 4 MG/2ML IJ SOLN
4.0000 mg | Freq: Four times a day (QID) | INTRAMUSCULAR | Status: DC | PRN
  Filled 2022-06-09: qty 2

## 2022-06-09 MED ORDER — METRONIDAZOLE 500 MG/100ML IV SOLN
500.0000 mg | Freq: Two times a day (BID) | INTRAVENOUS | Status: DC
  Administered 2022-06-09 – 2022-06-11 (×3): 500 mg via INTRAVENOUS
  Filled 2022-06-09 (×6): qty 100

## 2022-06-09 MED ORDER — ONDANSETRON HCL 4 MG PO TABS: 4.0000 mg | ORAL_TABLET | Freq: Four times a day (QID) | ORAL | Status: DC | PRN

## 2022-06-09 MED ORDER — KETOROLAC TROMETHAMINE 15 MG/ML IJ SOLN
15.0000 mg | Freq: Four times a day (QID) | INTRAMUSCULAR | Status: DC | PRN
  Administered 2022-06-10: 15 mg via INTRAVENOUS
  Filled 2022-06-09: qty 1

## 2022-06-09 MED ORDER — INSULIN ASPART 100 UNIT/ML IJ SOLN
0.0000 [IU] | Freq: Three times a day (TID) | INTRAMUSCULAR | Status: DC
  Administered 2022-06-10: 2 [IU] via SUBCUTANEOUS
  Administered 2022-06-10 (×2): 1 [IU] via SUBCUTANEOUS
  Administered 2022-06-10: 2 [IU] via SUBCUTANEOUS
  Administered 2022-06-11: 1 [IU] via SUBCUTANEOUS
  Filled 2022-06-09 (×5): qty 1

## 2022-06-09 MED ORDER — ACETAMINOPHEN 650 MG RE SUPP: 650.0000 mg | Freq: Four times a day (QID) | RECTAL | Status: DC | PRN

## 2022-06-09 MED ORDER — SODIUM CHLORIDE 0.9 % IV SOLN
2.0000 g | INTRAVENOUS | Status: DC
  Administered 2022-06-10: 2 g via INTRAVENOUS
  Filled 2022-06-09 (×2): qty 20

## 2022-06-09 MED ORDER — APIXABAN 5 MG PO TABS
5.0000 mg | ORAL_TABLET | Freq: Two times a day (BID) | ORAL | Status: DC
  Administered 2022-06-09 – 2022-06-11 (×4): 5 mg via ORAL
  Filled 2022-06-09 (×4): qty 1

## 2022-06-09 MED ORDER — TAMSULOSIN HCL 0.4 MG PO CAPS
0.4000 mg | ORAL_CAPSULE | Freq: Two times a day (BID) | ORAL | Status: DC
  Administered 2022-06-09 – 2022-06-11 (×4): 0.4 mg via ORAL
  Filled 2022-06-09 (×4): qty 1

## 2022-06-09 MED ORDER — SODIUM CHLORIDE 0.9 % IV SOLN: INTRAVENOUS | Status: DC

## 2022-06-09 MED ORDER — SENNOSIDES-DOCUSATE SODIUM 8.6-50 MG PO TABS: 1.0000 | ORAL_TABLET | Freq: Every evening | ORAL | Status: DC | PRN

## 2022-06-09 MED ORDER — MORPHINE SULFATE (PF) 4 MG/ML IV SOLN
4.0000 mg | Freq: Once | INTRAVENOUS | Status: AC
  Administered 2022-06-09: 4 mg via INTRAVENOUS
  Filled 2022-06-09: qty 1

## 2022-06-09 MED ORDER — MORPHINE SULFATE (PF) 4 MG/ML IV SOLN: 4.0000 mg | INTRAVENOUS | Status: AC | PRN

## 2022-06-09 MED ORDER — SODIUM CHLORIDE 0.9 % IV SOLN
1.0000 g | INTRAVENOUS | Status: DC
  Administered 2022-06-09: 1 g via INTRAVENOUS
  Filled 2022-06-09: qty 10

## 2022-06-09 MED ORDER — ATORVASTATIN CALCIUM 20 MG PO TABS
80.0000 mg | ORAL_TABLET | Freq: Every day | ORAL | Status: DC
  Administered 2022-06-10 – 2022-06-11 (×2): 80 mg via ORAL
  Filled 2022-06-09 (×2): qty 4

## 2022-06-09 NOTE — H&P (Addendum)
History and Physical   Jermaine Kramer [id number] DOB: 1951-02-03 DOA: 06/09/2022  PCP: Sherre Scarlet, PA-C  Patient coming from: home  I have personally briefly reviewed patient's old medical records in [place].  Chief Concern: abdominal pain  HPI: [name] is a 72 year old male with history of hyperlipidemia, hypertension, non-insulin-dependent diabetes mellitus, BPH, who presents emergency department for chief concerns of left flank pain.  Initial vitals in the ED showed temperature of 98.5, respiration rate of 20, heart rate of 88, blood pressure 119 over 71, SpO2 of 99% on room air.  Serum sodium is 125, potassium 4.9, chloride 94, bicarb 18, BUN of 38, serum creatinine of 2.48, EGFR 27, nonfasting blood glucose 293, WBC 6.3, hemoglobin 13.8, platelets of 190.  ED treatment: Morphine 2 mg IV one-time dose, 4 mg IV one-time dose, sodium chloride 500 mL bolus. --------------------------- At bedside, he is able to tell me his name, age, current calendar year, and in hospital.   He endorses left flank pain that started 3-4 days ago.  He reports that pushing on the right lower quadrant causes left lower quadrant pain.  He also endorses generalized abdominal pain that is relieved with IV pain medication given.  He denies trauma to his person.   He reports that over the last few days he has had difficulty moving his bowels.  However he did have a good bowel movement this a.m.  He denies bloody bowel movements.  He denies changes to his appetite.  He denies fever, nausea, vomiting. He reports his last bowel movement was this AM, and brown and normal in consistency.  He does not know when the last time he has had a colonoscopy.  Social history: He lives with his wife, daughter, and grandchild. He denies tobacco use, etoh, recreational drug use. He is retired and formerly worked for city of [employer].   ROS: Constitutional: no weight change, no fever ENT/Mouth: no  sore throat, no rhinorrhea Eyes: no eye pain, no vision changes Cardiovascular: no chest pain, no dyspnea,  no edema, no palpitations Respiratory: no cough, no sputum, no wheezing Gastrointestinal: no nausea, no vomiting, no diarrhea, no constipation Genitourinary: no urinary incontinence, no dysuria, no hematuria Musculoskeletal: no arthralgias, no myalgias, left flank pain Skin: no skin lesions, no pruritus, Neuro: + weakness, no loss of consciousness, no syncope Psych: no anxiety, no depression, + decrease appetite Heme/Lymph: no bruising, no bleeding  ED Course: Discussed with emergency medicine provider.  Assessment/Plan  Principal Problem:   Abdominal pain Active Problems:   AKI (acute kidney injury) with metabolic acidosis(HCC)   GERD (gastroesophageal reflux disease)   CAD s/p CABG x 3   Atrial fibrillation, chronic (HCC)   Obesity (BMI 30.0-34.9)   BPH (benign prostatic hyperplasia)   Diabetes mellitus without complication (HCC)   UTI (urinary tract infection)   Elevated lactic acid level   Assessment and Plan:  * Abdominal pain Etiology work up in progress - Lactic acid is initially 2.8, will continue to follow and discussed with cross overage provider - Central mesenteric haziness has increased compared to prior imaging, nonspecific - Discussed with radiologist and she states imaging is negative for secondary signs of mesenteric ischemia, negative for dilated bowel loops  - No clear diagnosis to consult specialist at this time - Ceftriaxone 2 g IV daily, metronidazole 500 mg p.o. twice daily - Admit to telemetry medical, observation length diagnosis  AKI (acute kidney injury) with metabolic acidosis(HCC) - Sodium chloride 125 mL/h -  BMP in the a.m.  Elevated lactic acid level - Follow second lactic acid - Blood cultures x 2 - Added urine culture - Cannot do IV sepsis bolus due to estimated ejection fraction less than 20% and patient is maintaining  appropriate MAP - Sodium chloride 125 mL/h, 1 day ordered  UTI (urinary tract infection) - Added urine culture - Ceftriaxone 2 g IV daily, metronidazole 500 mg IV BID   Diabetes mellitus without complication (HCC) - Insulin SSI with at bedtime coverage ordered  BPH (benign prostatic hyperplasia) - Tamsulosin 0.4 mg twice daily resumed  Obesity (BMI [id number]) - This complicates overall care and prognosis.   Atrial fibrillation, chronic (HCC) - Eliquis 5 mg p.o. twice daily resumed  Patient had outpatient colonoscopy on 04/14/2019: Which was read as first-degree hemorrhoids, polyp of the colon removed with jumbo cold forceps.  Complete echo on 05/16/2021: Estimated ejection fraction is less than 20%, left ventricle demonstrates global hypokinesis.  Grade 1 diastolic dysfunction. Chart reviewed.   DVT prophylaxis: Eliquis Code Status: Full code Diet: Clear liquids Family Communication: No Disposition Plan: Pending clinical course Consults called: None at this time Admission status: Telemetry medical, observation  Past Medical History:  Diagnosis Date   Arrhythmia    atrial fibrillation   CHF (congestive heart failure) ([place])    Coronary artery disease    Diabetes mellitus without complication (HCC)    GERD (gastroesophageal reflux disease)    Hypercholesteremia    Hypertension    Sleep apnea    Stroke [facility])    Past Surgical History:  Procedure Laterality Date   CATARACT EXTRACTION Bilateral 04/06/2022   CHOLECYSTECTOMY     COLONOSCOPY WITH PROPOFOL N/A 04/14/2019   Procedure: COLONOSCOPY WITH PROPOFOL;  Surgeon: Toledo, Benay Pike, MD;  Location: [facility] ENDOSCOPY;  Service: Gastroenterology;  Laterality: N/A;   CORONARY ARTERY BYPASS GRAFT  02/16/2021   ICD IMPLANT N/A 06/22/2021   Procedure: ICD IMPLANT;  Surgeon: Vickie Epley, MD;  Location: [facility];  Service: Cardiovascular;  Laterality: N/A;   KNEE ARTHROSCOPY     RENAL CYST EXCISION     Social  History:  reports that he has never smoked. He has never used smokeless tobacco. He reports that he does not currently use alcohol. He reports that he does not use drugs.  Allergies  Allergen Reactions   Doxycycline Anaphylaxis    Patient does not recall having this reaction.     Heparin Other (See Comments)    heparin-induced thrombocytopenia    Empagliflozin Itching    Groin infection Caused a groin infection.     Family History  Problem Relation Age of Onset   Parkinson's disease Mother    Lupus Mother    Heart disease Father    Family history: Family history reviewed and not pertinent.  Prior to Admission medications   Medication Sig Start Date End Date Taking? Authorizing Provider  apixaban (ELIQUIS) 5 MG TABS tablet Take 1 tablet (5 mg total) by mouth 2 (two) times daily. 11/28/21  Yes Hackney, Otila Kluver A, FNP  atorvastatin (LIPITOR) 80 MG tablet Take 1 tablet (80 mg total) by mouth daily. 01/10/22  Yes Hackney, Otila Kluver A, FNP  calcitRIOL (ROCALTROL) 0.25 MCG capsule Take 0.25 mcg by mouth daily. 03/07/22 03/07/23 Yes [provider]  Dulaglutide 0.75 MG/0.5ML SOPN Inject 0.75 mg into the skin once a week. 11/10/21 05/25/23 Yes [provider]  glucose blood (PRECISION QID TEST) test strip Use 3 (three) times daily Use as instructed.  One touch ultra 07/05/21 07/05/22 Yes [provider]  losartan (COZAAR) 25 MG tablet TAKE 1/2 TABLET BY MOUTH EVERY DAY 05/08/22  Yes Darylene Price A, FNP  metFORMIN (GLUCOPHAGE) 1000 MG tablet Take 500 mg by mouth 2 (two) times daily with a meal.   Yes [provider]  metoprolol succinate (TOPROL-XL) 25 MG 24 hr tablet Take 0.5 tablets (12.5 mg total) by mouth every evening. 10/11/21  Yes Hackney, Otila Kluver A, FNP  omeprazole (PRILOSEC) 40 MG capsule Take 40 mg by mouth daily. 03/11/21  Yes [provider]  spironolactone (ALDACTONE) 25 MG tablet Take 1 tablet (25 mg total) by mouth daily. 05/22/22  Yes Larey Dresser, MD   tamsulosin (FLOMAX) 0.4 MG CAPS capsule Take 0.4 mg by mouth in the morning and at bedtime.   Yes [provider]  torsemide (DEMADEX) 20 MG tablet Take 2 tablets (40 mg total) by mouth daily. And additional 27m if needed 05/22/22  Yes MLarey Dresser MD  ondansetron (ZOFRAN) 4 MG tablet Take 4 mg by mouth every 8 (eight) hours as needed. 09/13/21   [provider]   Physical Exam: Vitals:   06/09/22 2130 06/09/22 2200 06/09/22 2215 06/09/22 2230  BP: 111/75 97/64  113/73  Pulse: 69 69 73 76  Resp: (!) 22 15 15 14  $ Temp:      TempSrc:      SpO2: 99% 93% 96% 95%  Weight:       Constitutional: appears age-appropriate, frail, NAD, calm, mildly uncomfortable comfortable Eyes: PERRL, lids and conjunctivae normal ENMT: Mucous membranes are moist. Posterior pharynx clear of any exudate or lesions. Age-appropriate dentition. Hearing appropriate Neck: normal, supple, no masses, no thyromegaly Respiratory: clear to auscultation bilaterally, no wheezing, no crackles. Normal respiratory effort. No accessory muscle use.  Cardiovascular: Regular rate and rhythm, no murmurs / rubs / gallops. No extremity edema. 2+ pedal pulses. No carotid bruits.  Abdomen: Generalized tenderness, distended abdomen, no masses palpated, no hepatosplenomegaly. Bowel sounds positive.  Musculoskeletal: no clubbing / cyanosis. No joint deformity upper and lower extremities. Good ROM, no contractures, no atrophy. Normal muscle tone.  Skin: no rashes, lesions, ulcers. No induration Neurologic: Sensation intact. Strength 5/5 in all 4.  Psychiatric: Normal judgment and insight. Alert and oriented x 3. Normal mood.   EKG: independently reviewed, showing sinus rhythm with rate of 72, QTc 502  Chest x-ray on Admission: Not indicated on admission  CT Renal Stone Study  Result Date: 06/09/2022 CLINICAL DATA:  Left flank pain EXAM: CT ABDOMEN AND PELVIS WITHOUT CONTRAST TECHNIQUE: Multidetector CT imaging of  the abdomen and pelvis was performed following the standard protocol without IV contrast. RADIATION DOSE REDUCTION: This exam was performed according to the departmental dose-optimization program which includes automated exposure control, adjustment of the mA and/or kV according to patient size and/or use of iterative reconstruction technique. COMPARISON:  CT abdomen and pelvis 02/08/2022 FINDINGS: Lower chest: No acute abnormality. Hepatobiliary: No focal liver abnormality is seen. Status post cholecystectomy. No biliary dilatation. Pancreas: Unremarkable. No pancreatic ductal dilatation or surrounding inflammatory changes. Spleen: Normal in size without focal abnormality. Adrenals/Urinary Tract: Right adrenal calcifications are unchanged likely related to prior infection or trauma. No new adrenal lesions are seen. There is bilateral renal atrophy. There is no hydronephrosis. There are punctate nonobstructing left renal calculi. Bilateral renal cysts are present. The largest is in the superior pole the right kidney measuring 4.2 cm. The bladder is within normal limits. Stomach/Bowel: Small hiatal hernia  is present. Stomach is otherwise within normal limits. Appendix appears normal. No evidence of bowel wall thickening, distention, or inflammatory changes. There is sigmoid colon diverticulosis. Vascular/Lymphatic: Aortic atherosclerosis. No enlarged abdominal or pelvic lymph nodes. Reproductive: Prostate gland is enlarged, unchanged. Other: Small fat containing umbilical and inguinal hernias are again seen. There is central mesenteric haziness which has mildly increased when compared to prior study. There is no ascites. Musculoskeletal: Degenerative changes affect the spine and hips. IMPRESSION: 1. Nonobstructing left renal calculi. No hydronephrosis. 2. Central mesenteric haziness has mildly increased when compared to prior study. Findings are nonspecific and can be seen in the setting of mesenteric panniculitis.  Other etiologies are not excluded. 3. Colonic diverticulosis. 4. Small hiatal hernia. 5. Stable prostatomegaly. Aortic Atherosclerosis (ICD10-I70.0). Electronically Signed   By: Ronney Asters M.D.   On: 06/09/2022 20:45    Labs on Admission: I have personally reviewed following labs  CBC: Recent Labs  Lab 06/09/22 1931  WBC 6.3  HGB 13.8  HCT 40.9  MCV 90.9  PLT 99991111   Basic Metabolic Panel: Recent Labs  Lab 06/09/22 1931  NA 125*  K 4.9  CL 94*  CO2 18*  GLUCOSE 293*  BUN 38*  CREATININE 2.48*  CALCIUM 8.4*   GFR: Estimated Creatinine Clearance: 26.2 mL/min (A) (by C-G formula based on SCr of 2.48 mg/dL (H)).  Liver Function Tests: Recent Labs  Lab 06/09/22 2113  AST 30  ALT 24  ALKPHOS 71  BILITOT 0.8  PROT 6.8  ALBUMIN 3.5   Recent Labs  Lab 06/09/22 2113  LIPASE 57*   Cardiac Enzymes: Recent Labs  Lab 06/09/22 2113  CKTOTAL 21*   Urine analysis:    Component Value Date/Time   COLORURINE YELLOW (A) 06/09/2022 1932   APPEARANCEUR CLEAR (A) 06/09/2022 1932   APPEARANCEUR CLEAR 01/15/2013 1659   LABSPEC 1.016 06/09/2022 1932   LABSPEC 1.005 01/15/2013 1659   PHURINE 6.0 06/09/2022 1932   GLUCOSEU 150 (A) 06/09/2022 1932   GLUCOSEU NEGATIVE 01/15/2013 1659   HGBUR NEGATIVE 06/09/2022 1932   BILIRUBINUR NEGATIVE 06/09/2022 1932   BILIRUBINUR NEGATIVE 01/15/2013 1659   KETONESUR NEGATIVE 06/09/2022 1932   PROTEINUR NEGATIVE 06/09/2022 1932   NITRITE NEGATIVE 06/09/2022 1932   LEUKOCYTESUR TRACE (A) 06/09/2022 1932   LEUKOCYTESUR 1+ 01/15/2013 1659   CRITICAL CARE Performed by: Dr. Tobie Poet  Total critical care time: 35 minutes  Critical care time was exclusive of separately billable procedures and treating other patients.  Critical care was necessary to treat or prevent imminent or life-threatening deterioration.  Critical care was time spent personally by me on the following activities: development of treatment plan with patient and/or  surrogate as well as nursing, discussions with consultants, evaluation of patient's response to treatment, examination of patient, obtaining history from patient or surrogate, ordering and performing treatments and interventions, ordering and review of laboratory studies, ordering and review of radiographic studies, pulse oximetry and re-evaluation of patient's condition.  This document was prepared using Dragon Voice Recognition software and may include unintentional dictation errors.  Dr. Tobie Poet [facility]  If 7PM-7AM, please contact overnight-coverage provider If 7AM-7PM, please contact day coverage provider [email or web address]  06/09/2022, 11:28 PM

## 2022-06-09 NOTE — Assessment & Plan Note (Signed)
-   Insulin SSI with at bedtime coverage ordered °

## 2022-06-09 NOTE — ED Triage Notes (Signed)
Pt in form home via [facility] with sharp L flank pain x 3 days. States hx of kidney stones, and this feels similar to previous times. Hx of CHF, takes lasix daily. denies any sob or chest pain. Denies any hematuria

## 2022-06-09 NOTE — Assessment & Plan Note (Addendum)
-   Sodium chloride 125 mL/h - BMP in the a.m.

## 2022-06-09 NOTE — Assessment & Plan Note (Signed)
-   Added urine culture - Ceftriaxone 2 g IV daily, metronidazole 500 mg IV BID

## 2022-06-09 NOTE — Assessment & Plan Note (Addendum)
-   Follow second lactic acid - Blood cultures x 2 - Added urine culture - Cannot do IV sepsis bolus due to estimated ejection fraction less than 20% and patient is maintaining appropriate MAP - Sodium chloride 125 mL/h, 1 day ordered

## 2022-06-09 NOTE — Assessment & Plan Note (Signed)
-   This complicates overall care and prognosis.  

## 2022-06-09 NOTE — Hospital Course (Signed)
[name] is a 72 year old male with history of hyperlipidemia, hypertension, non-insulin-dependent diabetes mellitus, BPH, who presents emergency department for chief concerns of left flank pain.  Initial vitals in the ED showed temperature of 98.5, respiration rate of 20, heart rate of 88, blood pressure 119 over 71, SpO2 of 99% on room air.  Serum sodium is 125, potassium 4.9, chloride 94, bicarb 18, BUN of 38, serum creatinine of 2.48, EGFR 27, nonfasting blood glucose 293, WBC 6.3, hemoglobin 13.8, platelets of 190.  ED treatment: Morphine 2 mg IV one-time dose, 4 mg IV one-time dose, sodium chloride 500 mL bolus.

## 2022-06-09 NOTE — ED Provider Notes (Signed)
[facility] Provider Note    Event Date/Time   First MD Initiated Contact with Patient 06/09/22 2038     (approximate)   History   Flank Pain   HPI  Jermaine Kramer is a 72 y.o. male current and past medical history history of "arrhythmia" congestive heart failure coronary artery disease and diabetes hypertension  Further review of medical record indicates a history of diabetes, atrial fibrillation, coronary disease kidney stones   Patient has had a few episodes over the last few months where he will have a sudden onset of a very sharp hard to describe pain in his left lower abdomen.  It seems to come and go.  Tonight he had the same.  Not associate any nausea vomiting diarrhea or bloody stool.  No chest pain no fevers no shortness of breath  He has recently been seeing his doctor and taking his fluid pills.  Currently reports the pain is moderate to severe in intensity located left lower abdomen.  Physical Exam   Triage Vital Signs: ED Triage Vitals  Enc Vitals Group     BP 06/09/22 1921 119/71     Pulse Rate 06/09/22 1921 88     Resp 06/09/22 1921 20     Temp 06/09/22 1921 98.5 F (36.9 C)     Temp Source 06/09/22 1921 Oral     SpO2 06/09/22 1921 99 %     Weight 06/09/22 1922 182 lb (82.6 kg)     Height --      Head Circumference --      Peak Flow --      Pain Score 06/09/22 1921 10     Pain Loc --      Pain Edu? --      Excl. in [place]? --     Most recent vital signs: Vitals:   06/09/22 2200 06/09/22 2215  BP: 97/64   Pulse: 69 73  Resp: 15 15  Temp:    SpO2: 93% 96%     General: Awake, no distress.  CV:  Good peripheral perfusion.  Normal heart tones.  Palpable dorsalis pedis pulses and normal capillary refill in the lower legs bilaterally Resp:  Normal effort.  Clear bilateral Abd:  No distention.  Soft nontender nondistended except she reports some reproducible tenderness but has slight pain out of proportion to examination  involving the left lower abdomen.  No rebound or guarding.  No overlying skin lesions masses or hernias to noted Other:     ED Results / Procedures / Treatments   Labs (all labs ordered are listed, but only abnormal results are displayed) Labs Reviewed  URINALYSIS, ROUTINE W REFLEX MICROSCOPIC - Abnormal; Notable for the following components:      Result Value   Color, Urine YELLOW (*)    APPearance CLEAR (*)    Glucose, UA 150 (*)    Leukocytes,Ua TRACE (*)    All other components within normal limits  BASIC METABOLIC PANEL - Abnormal; Notable for the following components:   Sodium 125 (*)    Chloride 94 (*)    CO2 18 (*)    Glucose, Bld 293 (*)    BUN 38 (*)    Creatinine, Ser 2.48 (*)    Calcium 8.4 (*)    GFR, Estimated 27 (*)    All other components within normal limits  LIPASE, BLOOD - Abnormal; Notable for the following components:   Lipase 57 (*)    All other components  within normal limits  LACTIC ACID, PLASMA - Abnormal; Notable for the following components:   Lactic Acid, Venous 2.8 (*)    All other components within normal limits  CK - Abnormal; Notable for the following components:   Total CK 21 (*)    All other components within normal limits  BLOOD GAS, VENOUS - Abnormal; Notable for the following components:   pH, Ven 7.52 (*)    pCO2, Ven 28 (*)    pO2, Ven 53 (*)    All other components within normal limits  CBC  HEPATIC FUNCTION PANEL  BASIC METABOLIC PANEL  CBC     EKG  And interpreted by me at 2050 heart rate 70 QRS 170 QTc 500 Normal sinus rhythm right bundle branch block without evidence of acute ischemia   RADIOLOGY  CT Renal Stone Study  Result Date: 06/09/2022 CLINICAL DATA:  Left flank pain EXAM: CT ABDOMEN AND PELVIS WITHOUT CONTRAST TECHNIQUE: Multidetector CT imaging of the abdomen and pelvis was performed following the standard protocol without IV contrast. RADIATION DOSE REDUCTION: This exam was performed according to the  departmental dose-optimization program which includes automated exposure control, adjustment of the mA and/or kV according to patient size and/or use of iterative reconstruction technique. COMPARISON:  CT abdomen and pelvis 02/08/2022 FINDINGS: Lower chest: No acute abnormality. Hepatobiliary: No focal liver abnormality is seen. Status post cholecystectomy. No biliary dilatation. Pancreas: Unremarkable. No pancreatic ductal dilatation or surrounding inflammatory changes. Spleen: Normal in size without focal abnormality. Adrenals/Urinary Tract: Right adrenal calcifications are unchanged likely related to prior infection or trauma. No new adrenal lesions are seen. There is bilateral renal atrophy. There is no hydronephrosis. There are punctate nonobstructing left renal calculi. Bilateral renal cysts are present. The largest is in the superior pole the right kidney measuring 4.2 cm. The bladder is within normal limits. Stomach/Bowel: Small hiatal hernia is present. Stomach is otherwise within normal limits. Appendix appears normal. No evidence of bowel wall thickening, distention, or inflammatory changes. There is sigmoid colon diverticulosis. Vascular/Lymphatic: Aortic atherosclerosis. No enlarged abdominal or pelvic lymph nodes. Reproductive: Prostate gland is enlarged, unchanged. Other: Small fat containing umbilical and inguinal hernias are again seen. There is central mesenteric haziness which has mildly increased when compared to prior study. There is no ascites. Musculoskeletal: Degenerative changes affect the spine and hips. IMPRESSION: 1. Nonobstructing left renal calculi. No hydronephrosis. 2. Central mesenteric haziness has mildly increased when compared to prior study. Findings are nonspecific and can be seen in the setting of mesenteric panniculitis. Other etiologies are not excluded. 3. Colonic diverticulosis. 4. Small hiatal hernia. 5. Stable prostatomegaly. Aortic Atherosclerosis (ICD10-I70.0).  Electronically Signed   By: Ronney Asters M.D.   On: 06/09/2022 20:45      PROCEDURES:  Critical Care performed: No  Procedures   MEDICATIONS ORDERED IN ED: Medications  atorvastatin (LIPITOR) tablet 80 mg (has no administration in time range)  tamsulosin (FLOMAX) capsule 0.4 mg (has no administration in time range)  acetaminophen (TYLENOL) tablet 650 mg (has no administration in time range)    Or  acetaminophen (TYLENOL) suppository 650 mg (has no administration in time range)  ondansetron (ZOFRAN) tablet 4 mg (has no administration in time range)    Or  ondansetron (ZOFRAN) injection 4 mg (has no administration in time range)  senna-docusate (Senokot-S) tablet 1 tablet (has no administration in time range)  morphine (PF) 4 MG/ML injection 4 mg (4 mg Intravenous Given 06/09/22 2110)  sodium chloride 0.9 % bolus 500  mL (500 mLs Intravenous New Bag/Given 06/09/22 2215)  morphine (PF) 2 MG/ML injection 2 mg (2 mg Intravenous Given 06/09/22 2212)     IMPRESSION / MDM / ASSESSMENT AND PLAN / ED COURSE  I reviewed the triage vital signs and the nursing notes.                              Differential diagnosis includes but is not limited to, abdominal perforation, aortic dissection, cholecystitis, appendicitis, diverticulitis, colitis, esophagitis/gastritis, kidney stone, pyelonephritis, urinary tract infection, aortic aneurysm. All are considered in decision and treatment plan. Based upon the patient's presentation and risk factors, as well as underlying renal disease we will proceed by obtaining a CT scan without contrast.  Ideally would perform imaging with contrast to evaluate for vascular causation, but in this particular instance I do not believe we should.  He does not have severe hypertension he has no ripping tearing or moving pain.  He has intact lower extremity vascular examination without evidence of acute ischemia making dissection or acute embolic or thrombotic disease less  likely.  After reviewing his labs he does have mild acute on chronic renal disease he has a reduced CO2 of 18 and on further evaluation as we exclude potential ischemic abnormality as lactic acid is elevated.  Will give him a gentle fluid bolus given his underlying history of CHF, but given the pain which is alleviated by morphine, will admit for further workup including consideration for GI consult.  Consulted with Dr. Tobie Poet our hospitalist    Patient's presentation is most consistent with acute complicated illness / injury requiring diagnostic workup.   The patient is on the cardiac monitor to evaluate for evidence of arrhythmia and/or significant heart rate changes.  Patient and his wife understand plan for admission.     FINAL CLINICAL IMPRESSION(S) / ED DIAGNOSES   Final diagnoses:  Colicky LLQ abdominal pain  Elevated lactic acid level  Hyponatremia  AKI (acute kidney injury) ([place])     Rx / DC Orders   ED Discharge Orders     None        Note:  This document was prepared using Dragon voice recognition software and may include unintentional dictation errors.   Delman Kitten, MD 06/09/22 2230

## 2022-06-09 NOTE — Assessment & Plan Note (Signed)
-   Eliquis 5 mg p.o. twice daily resumed

## 2022-06-09 NOTE — Assessment & Plan Note (Addendum)
Etiology work up in progress - Lactic acid is initially 2.8, will continue to follow and discussed with cross overage provider - Central mesenteric haziness has increased compared to prior imaging, nonspecific - Discussed with radiologist and she states imaging is negative for secondary signs of mesenteric ischemia, negative for dilated bowel loops  - No clear diagnosis to consult specialist at this time - Ceftriaxone 2 g IV daily, metronidazole 500 mg p.o. twice daily - Admit to telemetry medical, observation length diagnosis

## 2022-06-09 NOTE — Assessment & Plan Note (Signed)
-   Tamsulosin 0.4 mg twice daily resumed

## 2022-06-10 DIAGNOSIS — I130 Hypertensive heart and chronic kidney disease with heart failure and stage 1 through stage 4 chronic kidney disease, or unspecified chronic kidney disease: Secondary | ICD-10-CM | POA: Diagnosis present

## 2022-06-10 DIAGNOSIS — R1032 Left lower quadrant pain: Secondary | ICD-10-CM | POA: Diagnosis present

## 2022-06-10 DIAGNOSIS — I482 Chronic atrial fibrillation, unspecified: Secondary | ICD-10-CM | POA: Diagnosis present

## 2022-06-10 DIAGNOSIS — N200 Calculus of kidney: Secondary | ICD-10-CM | POA: Diagnosis present

## 2022-06-10 DIAGNOSIS — Z7901 Long term (current) use of anticoagulants: Secondary | ICD-10-CM | POA: Diagnosis not present

## 2022-06-10 DIAGNOSIS — K219 Gastro-esophageal reflux disease without esophagitis: Secondary | ICD-10-CM | POA: Diagnosis present

## 2022-06-10 DIAGNOSIS — Z820 Family history of epilepsy and other diseases of the nervous system: Secondary | ICD-10-CM | POA: Diagnosis not present

## 2022-06-10 DIAGNOSIS — E1122 Type 2 diabetes mellitus with diabetic chronic kidney disease: Secondary | ICD-10-CM | POA: Diagnosis present

## 2022-06-10 DIAGNOSIS — N179 Acute kidney failure, unspecified: Secondary | ICD-10-CM | POA: Diagnosis present

## 2022-06-10 DIAGNOSIS — E871 Hypo-osmolality and hyponatremia: Secondary | ICD-10-CM | POA: Diagnosis present

## 2022-06-10 DIAGNOSIS — N400 Enlarged prostate without lower urinary tract symptoms: Secondary | ICD-10-CM | POA: Diagnosis present

## 2022-06-10 DIAGNOSIS — Z6831 Body mass index (BMI) 31.0-31.9, adult: Secondary | ICD-10-CM | POA: Diagnosis not present

## 2022-06-10 DIAGNOSIS — E669 Obesity, unspecified: Secondary | ICD-10-CM | POA: Diagnosis present

## 2022-06-10 DIAGNOSIS — Z7984 Long term (current) use of oral hypoglycemic drugs: Secondary | ICD-10-CM | POA: Diagnosis not present

## 2022-06-10 DIAGNOSIS — K5289 Other specified noninfective gastroenteritis and colitis: Secondary | ICD-10-CM | POA: Diagnosis present

## 2022-06-10 DIAGNOSIS — R1084 Generalized abdominal pain: Secondary | ICD-10-CM | POA: Diagnosis not present

## 2022-06-10 DIAGNOSIS — I2510 Atherosclerotic heart disease of native coronary artery without angina pectoris: Secondary | ICD-10-CM | POA: Diagnosis present

## 2022-06-10 DIAGNOSIS — K5909 Other constipation: Secondary | ICD-10-CM | POA: Diagnosis present

## 2022-06-10 DIAGNOSIS — E872 Acidosis, unspecified: Secondary | ICD-10-CM | POA: Diagnosis present

## 2022-06-10 DIAGNOSIS — Z8249 Family history of ischemic heart disease and other diseases of the circulatory system: Secondary | ICD-10-CM | POA: Diagnosis not present

## 2022-06-10 DIAGNOSIS — E7800 Pure hypercholesterolemia, unspecified: Secondary | ICD-10-CM | POA: Diagnosis present

## 2022-06-10 DIAGNOSIS — N1832 Chronic kidney disease, stage 3b: Secondary | ICD-10-CM | POA: Diagnosis present

## 2022-06-10 DIAGNOSIS — Z951 Presence of aortocoronary bypass graft: Secondary | ICD-10-CM | POA: Diagnosis not present

## 2022-06-10 DIAGNOSIS — I5022 Chronic systolic (congestive) heart failure: Secondary | ICD-10-CM | POA: Diagnosis present

## 2022-06-10 DIAGNOSIS — E119 Type 2 diabetes mellitus without complications: Secondary | ICD-10-CM | POA: Diagnosis present

## 2022-06-10 DIAGNOSIS — G4730 Sleep apnea, unspecified: Secondary | ICD-10-CM | POA: Diagnosis present

## 2022-06-10 LAB — CBC
HCT: 35.9 % — ABNORMAL LOW (ref 39.0–52.0)
Hemoglobin: 12.2 g/dL — ABNORMAL LOW (ref 13.0–17.0)
MCH: 30.6 pg (ref 26.0–34.0)
MCHC: 34 g/dL (ref 30.0–36.0)
MCV: 90 fL (ref 80.0–100.0)
Platelets: 156 10*3/uL (ref 150–400)
RBC: 3.99 MIL/uL — ABNORMAL LOW (ref 4.22–5.81)
RDW: 13 % (ref 11.5–15.5)
WBC: 6.4 10*3/uL (ref 4.0–10.5)
nRBC: 0 % (ref 0.0–0.2)

## 2022-06-10 LAB — BASIC METABOLIC PANEL
Anion gap: 3 — ABNORMAL LOW (ref 5–15)
BUN: 40 mg/dL — ABNORMAL HIGH (ref 8–23)
CO2: 23 mmol/L (ref 22–32)
Calcium: 8 mg/dL — ABNORMAL LOW (ref 8.9–10.3)
Chloride: 103 mmol/L (ref 98–111)
Creatinine, Ser: 2.36 mg/dL — ABNORMAL HIGH (ref 0.61–1.24)
GFR, Estimated: 28 mL/min — ABNORMAL LOW (ref >60)
Glucose, Bld: 183 mg/dL — ABNORMAL HIGH (ref 70–99)
Potassium: 4.7 mmol/L (ref 3.5–5.1)
Sodium: 129 mmol/L — ABNORMAL LOW (ref 135–145)

## 2022-06-10 LAB — GLUCOSE, CAPILLARY
Glucose-Capillary: 126 mg/dL — ABNORMAL HIGH (ref 70–99)
Glucose-Capillary: 131 mg/dL — ABNORMAL HIGH (ref 70–99)
Glucose-Capillary: 138 mg/dL — ABNORMAL HIGH (ref 70–99)
Glucose-Capillary: 178 mg/dL — ABNORMAL HIGH (ref 70–99)
Glucose-Capillary: 187 mg/dL — ABNORMAL HIGH (ref 70–99)

## 2022-06-10 LAB — HEMOGLOBIN A1C
Hgb A1c MFr Bld: 8.4 % — ABNORMAL HIGH (ref 4.8–5.6)
Mean Plasma Glucose: 194.38 mg/dL

## 2022-06-10 LAB — LACTIC ACID, PLASMA: Lactic Acid, Venous: 1.8 mmol/L (ref 0.5–1.9)

## 2022-06-10 MED ORDER — TORSEMIDE 20 MG PO TABS: 20.0000 mg | ORAL_TABLET | Freq: Every day | ORAL | Status: DC | PRN

## 2022-06-10 MED ORDER — TORSEMIDE 20 MG PO TABS
40.0000 mg | ORAL_TABLET | Freq: Every day | ORAL | Status: DC
  Administered 2022-06-10 – 2022-06-11 (×2): 40 mg via ORAL
  Filled 2022-06-10 (×2): qty 2

## 2022-06-10 MED ORDER — POLYETHYLENE GLYCOL 3350 17 G PO PACK
17.0000 g | PACK | Freq: Every day | ORAL | Status: DC
  Administered 2022-06-10 – 2022-06-11 (×2): 17 g via ORAL
  Filled 2022-06-10 (×2): qty 1

## 2022-06-10 MED ORDER — SENNOSIDES-DOCUSATE SODIUM 8.6-50 MG PO TABS
1.0000 | ORAL_TABLET | Freq: Every day | ORAL | Status: DC
  Administered 2022-06-10: 1 via ORAL
  Filled 2022-06-10: qty 1

## 2022-06-10 MED ORDER — METOPROLOL SUCCINATE ER 25 MG PO TB24: 12.5000 mg | ORAL_TABLET | Freq: Every evening | ORAL | Status: DC

## 2022-06-10 NOTE — Progress Notes (Signed)
PROGRESS NOTE  Jermaine Kramer  DOB: 1950/09/16  PCP: Sherre Scarlet, [place] MRN:[id number]  DOA: 06/09/2022  LOS: 0 days  Hospital Day: 2  Brief narrative: Jermaine Kramer is a 72 y.o. male with PMH significant for DM2, HTN, HLD, CAD, A-fib, severe systolic CHF, stroke, sleep apnea, GERD, BPH 2/9, patient presented to the ED with complaint of left flank pain that started 3 to 4 days ago, denies history of trauma.  Also reported difficulty moving his bowels for the last few days but had a good bowel movement the morning prior to presentation, no blood or black stool.  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 Patient was given IV fluid, IV morphine Admitted to [facility]  Of note, last colonoscopy 04/2019 had shown first-degree hemorrhoids, colonic polyp that was removed. Last echo January 2023 with EF less than 20%, LV global hypokinesis, grade 1 diastolic dysfunction   Subjective: Patient was seen and examined this morning.  Pleasant elderly Caucasian male.  Propped up in..  2 L oxygen by nasal cannula. Advance to soft diet today.  Had not tried yet. Wife was available on the phone Chart reviewed.  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 Patient has been empirically started on IV Rocephin and IV Flagyl Patient's wife believes that his symptoms flareup when he is constipated.  No bowel movement in 2 days.  Starting scheduled Senokot and daily MiraLAX. For pain control, patient is currently on Tylenol only. Continue monitor. Recent Labs  Lab 06/09/22 1931 06/09/22 2113 06/09/22 2356 06/10/22 0501  WBC 6.3  --   --  6.4  LATICACIDVEN  --  2.8* 1.8  --    Lactic acidosis In the setting of severe systolic CHF Blood pressure low normal.  This would raise concern of cardiogenic shock.  But lactic acid level normalized in few hours.   AKI on CKD 3B Creatinine at baseline close to 2.  Presented with creatinine elevated to 2.48, gradually improved after IV fluid bolus. Continue to monitor Recent Labs    12/02/21 0903 12/06/21 1132 01/24/22 1022 01/31/22 0937 02/08/22 0931 04/07/22 1124 05/16/22 1358 05/18/22 1012 05/29/22 0830 06/09/22 1931 06/10/22 0501  BUN 31* 39* 35* 34*  --  37* 37* 36* 48* 38* 40*  CREATININE 1.88* 2.10* 2.03* 2.00* 2.10* 2.11* 2.04* 1.98* 2.07* 2.48* 2.36*   Hyponatremia Sodium level was significantly low at 125 on presentation.  Probably because of poor oral intake.  Improved to 129 today.  Continue to monitor. Recent Labs  Lab 06/09/22 1931 06/10/22 0501  NA 125* 129*   Chronic systolic CHF Last echo January 2023 with EF less than 20%, LV global hypokinesis, grade 1 diastolic dysfunction PTA on Toprol 12.5 mg daily, torsemide 40 mg daily, losartan 12.5 mg daily, Aldactone 25 mg  daily Blood pressures running low normal.  Currently all meds on hold. I would resume Toprol and torsemide today.  Keep others on hold.  Chronic A-fib PTA on Toprol 12.5 mg daily, Eliquis 5 mg twice daily Continue both  Type 2 diabetes mellitus A1c 7.8 in January 2023 PTA on dulaglutide weekly, metformin 500 mg twice daily Definitely stop metformin given poor baseline kidney  function. Currently on sliding scale insulin with Accu-Cheks. Recent Labs  Lab 06/09/22 2342 06/10/22 0015 06/10/22 0736 06/10/22 1206  GLUCAP 144* 131* 178* 138*   BPH  Tamsulosin 0.4 mg twice daily resumed  Constipation   Obesity  Body mass index is 31.15 kg/m. Patient has been advised to make an attempt to improve diet and exercise patterns to aid in weight loss.    Mobility: Encourage ambulation.  Previously independent at home  Goals of care   Code Status: Full Code     DVT prophylaxis:  Place TED hose Start: 06/09/22 2224 apixaban (ELIQUIS) tablet 5 mg   Antimicrobials: IV Rocephin, IV Flagyl Fluid: NS Consultants: None Family Communication: Wife on the phone  Status is: Observation Level of care: Telemetry Medical   Dispo: Patient is from: Home              Anticipated d/c is to: Home Continue in-hospital care because: If pain abdomen improves and labs stable, plan to discharge tomorrow   Scheduled Meds:  apixaban  5 mg Oral BID   atorvastatin  80 mg Oral Daily   insulin aspart  0-9 Units Subcutaneous TID AC & HS   metoprolol succinate  12.5 mg Oral QPM   polyethylene glycol  17 g Oral Daily   senna-docusate  1 tablet Oral QHS   tamsulosin  0.4 mg Oral BID   torsemide  40 mg Oral Daily    PRN meds: acetaminophen **OR** acetaminophen, ondansetron **OR** ondansetron (ZOFRAN) IV, torsemide   Infusions:   cefTRIAXone (ROCEPHIN)  IV     metronidazole Stopped (06/10/22 0130)    Diet:  Diet Order             DIET DYS 3 Room service appropriate? Yes; Fluid consistency: Thin  Diet effective now                   Antimicrobials: Anti-infectives (From admission, onward)    Start     Dose/Rate Route Frequency Ordered Stop   06/10/22 2200  cefTRIAXone (ROCEPHIN) 2 g in sodium chloride 0.9 % 100 mL IVPB        2 g 200 mL/hr over 30 Minutes Intravenous Every 24 hours 06/09/22 2311 06/16/22 2159   06/09/22 2315  metroNIDAZOLE (FLAGYL) IVPB 500  mg        500 mg 100 mL/hr over 60 Minutes Intravenous Every 12 hours 06/09/22 2303     06/09/22 2245  cefTRIAXone (ROCEPHIN) 1 g in sodium chloride 0.9 % 100 mL IVPB  Status:  Discontinued        1 g 200 mL/hr over 30 Minutes Intravenous Every 24 hours 06/09/22 2232 06/09/22 2311       Skin assessment:       Nutritional status:  Body mass index is 31.15 kg/m.          Objective: Vitals:   06/10/22 0733 06/10/22 1205  BP: (!) 87/59 100/64  Pulse: 71 69  Resp: 20 18  Temp: (!) 97.4 F (36.3 C) 99 F (37.2 C)  SpO2: 100% 100%    Intake/Output Summary (  Last 24 hours) at 06/10/2022 1426 Last data filed at 06/10/2022 1117 Gross per 24 hour  Intake 1479.2 ml  Output --  Net 1479.2 ml   Filed Weights   06/09/22 1922 06/10/22 0020  Weight: 82.6 kg 84.9 kg   Weight change:  Body mass index is 31.15 kg/m.   Physical Exam: General exam: Pleasant, elderly Caucasian male.  Pain level pain improving Skin: No rashes, lesions or ulcers. HEENT: Atraumatic, normocephalic, no obvious bleeding Lungs: Clear to auscultate bilaterally CVS: Regular rate and rhythm, no murmur GI/Abd soft, distended, tenderness present but improving on the left lower quadrant and flank CNS: Alert, awake, oriented x 3 Psychiatry: Mood. Extremities: No pedal edema, no calf tenderness  Data Review: I have personally reviewed the laboratory data and studies available.  F/u labs ordered Unresulted Labs (From admission, onward)     Start     Ordered   06/11/22 [id number]  Basic metabolic panel  Tomorrow morning,   R        06/10/22 1426   06/11/22 0500  CBC with Differential/Platelet  Tomorrow morning,   R        06/10/22 1426   06/09/22 2233  Urine Culture (for pregnant, neutropenic or urologic patients or patients with an indwelling urinary catheter)  (Urine Labs)  Once,   R       Question:  Indication  Answer:  Flank Pain   06/09/22 2233            Total time spent in review of labs and  imaging, patient evaluation, formulation of plan, documentation and communication with family: 26 minutes  Signed, Terrilee Croak, MD [facility] 06/10/2022

## 2022-06-11 DIAGNOSIS — R1084 Generalized abdominal pain: Secondary | ICD-10-CM | POA: Diagnosis not present

## 2022-06-11 LAB — BASIC METABOLIC PANEL
Anion gap: 6 (ref 5–15)
BUN: 31 mg/dL — ABNORMAL HIGH (ref 8–23)
CO2: 24 mmol/L (ref 22–32)
Calcium: 8.5 mg/dL — ABNORMAL LOW (ref 8.9–10.3)
Chloride: 105 mmol/L (ref 98–111)
Creatinine, Ser: 2.13 mg/dL — ABNORMAL HIGH (ref 0.61–1.24)
GFR, Estimated: 32 mL/min — ABNORMAL LOW (ref >60)
Glucose, Bld: 140 mg/dL — ABNORMAL HIGH (ref 70–99)
Potassium: 4.3 mmol/L (ref 3.5–5.1)
Sodium: 135 mmol/L (ref 135–145)

## 2022-06-11 LAB — CBC WITH DIFFERENTIAL/PLATELET
Abs Immature Granulocytes: 0.02 10*3/uL (ref 0.00–0.07)
Basophils Absolute: 0 10*3/uL (ref 0.0–0.1)
Basophils Relative: 0 %
Eosinophils Absolute: 0.2 10*3/uL (ref 0.0–0.5)
Eosinophils Relative: 3 %
HCT: 36.1 % — ABNORMAL LOW (ref 39.0–52.0)
Hemoglobin: 11.9 g/dL — ABNORMAL LOW (ref 13.0–17.0)
Immature Granulocytes: 0 %
Lymphocytes Relative: 14 %
Lymphs Abs: 0.9 10*3/uL (ref 0.7–4.0)
MCH: 30.4 pg (ref 26.0–34.0)
MCHC: 33 g/dL (ref 30.0–36.0)
MCV: 92.3 fL (ref 80.0–100.0)
Monocytes Absolute: 0.5 10*3/uL (ref 0.1–1.0)
Monocytes Relative: 8 %
Neutro Abs: 4.6 10*3/uL (ref 1.7–7.7)
Neutrophils Relative %: 75 %
Platelets: 150 10*3/uL (ref 150–400)
RBC: 3.91 MIL/uL — ABNORMAL LOW (ref 4.22–5.81)
RDW: 13.4 % (ref 11.5–15.5)
WBC: 6.2 10*3/uL (ref 4.0–10.5)
nRBC: 0 % (ref 0.0–0.2)

## 2022-06-11 LAB — URINE CULTURE

## 2022-06-11 LAB — GLUCOSE, CAPILLARY: Glucose-Capillary: 142 mg/dL — ABNORMAL HIGH (ref 70–99)

## 2022-06-11 MED ORDER — CEFDINIR 300 MG PO CAPS: 300.0000 mg | ORAL_CAPSULE | Freq: Two times a day (BID) | ORAL | 0 refills | Status: AC

## 2022-06-11 MED ORDER — POLYETHYLENE GLYCOL 3350 17 G PO PACK: 17.0000 g | PACK | Freq: Every day | ORAL | 0 refills | Status: AC | PRN

## 2022-06-11 MED ORDER — METRONIDAZOLE 500 MG PO TABS: 500.0000 mg | ORAL_TABLET | Freq: Two times a day (BID) | ORAL | 0 refills | Status: AC

## 2022-06-11 MED ORDER — SENNOSIDES-DOCUSATE SODIUM 8.6-50 MG PO TABS: 1.0000 | ORAL_TABLET | Freq: Every day | ORAL | 0 refills | Status: AC

## 2022-06-11 NOTE — Discharge Summary (Addendum)
Physician Discharge Summary  Jermaine Kramer [id number] DOB: February 02, 1951 DOA: 06/09/2022  PCP: Sherre Scarlet, PA-C  Admit date: 06/09/2022 Discharge date: 06/11/2022  Admitted From: Home Discharge disposition: Home  Recommendations at discharge:  3 more days of antibiotics Stop metformin Monitor blood pressure at home.   Follow-up with heart failure team as an outpatient.   Brief narrative: Jermaine Kramer is a 72 y.o. male with PMH significant for DM2, HTN, HLD, CAD, A-fib, severe systolic CHF, stroke, sleep apnea, GERD, BPH 2/9, patient presented to the ED with complaint of left flank pain that started 3 to 4 days ago, denies history of trauma.  Also reported difficulty moving his bowels for the last few days but had a good bowel movement the morning prior to presentation, no blood or black stool.  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 Patient was given IV fluid, IV morphine Admitted to [facility]  Of note, last colonoscopy 04/2019 had shown first-degree hemorrhoids, colonic polyp that was removed. Last echo January 2023 with EF less than 20%, LV global hypokinesis, grade 1 diastolic dysfunction.  Subjective: Patient was seen and examined this morning.  Pleasant elderly Caucasian male.  Propped up in.. Was on low-flow oxygen.  I asked him to remove it and he did fine for the next 5 to 10 minutes of our conversation.  Does not use oxygen at home. Able to tolerate soft diet.  Wife at bedside.  Both feel ready for discharge today.  Hospital course: Mesenteric pancolitis  Presented with 3 to 4-day history of left flank pain WBC count normal, lactic acid was elevated to 2.8 CT renal study  with central mesenteric haziness suggestive of mesenteric pancolitis Per admitting physician, this was discussed with radiologist who stated that the imaging is negative for any evidence of mesenteric ischemia or dilated loops. Patient has been empirically started on IV Rocephin and IV Flagyl.  Clinically improved.  WBC count was never elevated.  Lactic acid level improved as well. Will discharge him on 3 more days of oral Omnicef and Flagyl to complete a 5-day course.  Chronic constipation Patient's wife believes that his symptoms flareup when he is constipated.  At her request, I started the patient scheduled Senokot and MiraLAX. Continue the same at home. Recent Labs  Lab 06/09/22 1931 06/09/22 2113 06/09/22 2356 06/10/22 0501 06/11/22 0446  WBC 6.3  --   --  6.4 6.2  LATICACIDVEN  --  2.8* 1.8  --   --    Lactic acidosis In the setting of severe systolic CHF Blood pressure low normal.  This would raise concern of cardiogenic shock.  But lactic acid level normalized in few hours.  Chronic systolic CHF Last echo January 2023 with EF less than 20%, LV global hypokinesis, grade 1 diastolic dysfunction PTA on Toprol 12.5 mg daily, torsemide 40 mg daily, losartan 12.5 mg daily, Aldactone 25 mg daily Per patient and family, his blood pressure always run low on these medications.  Gradually resumed.  Continue the same at home.   AKI on CKD 3B Creatinine at baseline close to 2.  Presented with creatinine elevated to 2.48, gradually improved after IV fluid and holding of diuretics.  Diuretics resumed yesterday.  Creatinine improving.  Trend as below. Recent  Labs    12/06/21 1132 01/24/22 1022 01/31/22 0937 02/08/22 0931 04/07/22 1124 05/16/22 1358 05/18/22 1012 05/29/22 0830 06/09/22 1931 06/10/22 0501 06/11/22 0446  BUN 39* 35* 34*  --  37* 37* 36* 48* 38* 40* 31*  CREATININE 2.10* 2.03* 2.00* 2.10* 2.11* 2.04* 1.98* 2.07* 2.48* 2.36* 2.13*   Hyponatremia Sodium level was  significantly low at 125 on presentation.  Probably because of poor oral intake.  Gradually improved with improvement in nutrition.  Trend as below. Recent Labs  Lab 06/09/22 1931 06/10/22 0501 06/11/22 0446  NA 125* 129* 135   Chronic A-fib PTA on Toprol 12.5 mg daily, Eliquis 5 mg twice daily Continue both  Type 2 diabetes mellitus A1c 7.8 in January 2023 PTA on dulaglutide weekly, metformin 500 mg twice daily Definitely stop metformin given poor baseline kidney function.  I have explained this to patient and his wife. Can resume dulaglutide weekly. Recent Labs  Lab 06/10/22 0736 06/10/22 1206 06/10/22 1649 06/10/22 2107 06/11/22 0850  GLUCAP 178* 138* 187* 126* 142*   BPH  Tamsulosin 0.4 mg twice daily   Constipation   Obesity  Body mass index is 31.15 kg/m. Patient has been advised to make an attempt to improve diet and exercise patterns to aid in weight loss.    Mobility: Encourage ambulation.  Previously independent at home  Goals of care   Code Status: Full Code   Wounds:  -    Discharge Exam:   Vitals:   06/10/22 1647 06/10/22 2105 06/11/22 0541 06/11/22 0816  BP: 97/69 99/68 (!) 94/59 98/68  Pulse: 72 71 69 70  Resp: 18 16 16 16  $ Temp: 98.4 F (36.9 C) 98 F (36.7 C) 97.9 F (36.6 C) 97.9 F (36.6 C)  TempSrc:  Oral Oral   SpO2: 100% 100% 99% 100%  Weight:      Height:        Body mass index is 31.15 kg/m.   General exam: Pleasant, elderly Caucasian male.  Pain level pain improving Skin: No rashes, lesions or ulcers. HEENT: Atraumatic, normocephalic, no obvious bleeding Lungs: Clear to auscultation bilaterally CVS: Regular rate and rhythm, no murmur GI/Abd soft, distended, tenderness improved. CNS: Alert, awake, oriented x 3 Psychiatry: Mood appropriate. Extremities: No pedal edema, no calf tenderness  Follow ups:    Follow-up Information     Sherre Scarlet, PA-C Follow up.   Specialty: Physician Assistant Contact  information: [place] [phone]                 Discharge Instructions:   Discharge Instructions     Call MD for:  difficulty breathing, headache or visual disturbances   Complete by: As directed    Call MD for:  extreme fatigue   Complete by: As directed    Call MD for:  hives   Complete by: As directed    Call MD for:  persistant dizziness or light-headedness   Complete by: As directed    Call MD for:  persistant nausea and vomiting   Complete by: As directed    Call MD for:  severe uncontrolled pain   Complete by: As directed    Call MD for:  temperature >100.4   Complete by: As directed    Diet - low sodium heart healthy   Complete by: As directed    Diet Carb Modified   Complete by: As directed    Discharge instructions   Complete by: As directed  Recommendations at discharge:   3 more days of antibiotics  Stop metformin  Monitor blood pressure at home.    Follow-up with heart failure team as an outpatient.  Discharge instructions for diabetes mellitus: Check blood sugar 3 times a day and bedtime at home. If blood sugar running above 200 or less than 70 please call your MD to adjust insulin. If you notice signs and symptoms of hypoglycemia (low blood sugar) like jitteriness, confusion, thirst, tremor and sweating, please check blood sugar, drink sugary drink/biscuits/sweets to increase sugar level and call MD or return to ER.    Discharge instructions for CHF Check weight daily -preferably same time every day. Restrict fluid intake to 1200 ml daily Restrict salt intake to less than 2 g daily. Call MD if you have one of the following symptoms 1) 3 pound weight gain in 24 hours or 5 pounds in 1 week  2) swelling in the hands, feet or stomach  3) progressive shortness of breath 4) if you have to sleep on extra pillows at night in order to breathe     General discharge instructions: Follow with Primary MD Sherre Scarlet, PA-C in 7 days  Please request your PCP  to go over your hospital tests, procedures, radiology results at the follow up. Please get your medicines reviewed and adjusted.  Your PCP may decide to repeat certain labs or tests as needed. Do not drive, operate heavy machinery, perform activities at heights, swimming or participation in water activities or provide baby sitting services if your were admitted for syncope or siezures until you have seen by Primary MD or a Neurologist and advised to do so again. [place] Controlled Substance Reporting System database was reviewed. Do not drive, operate heavy machinery, perform activities at heights, swim, participate in water activities or provide baby-sitting services while on medications for pain, sleep and mood until your outpatient physician has reevaluated you and advised to do so again.  You are strongly recommended to comply with the dose, frequency and duration of prescribed medications. Activity: As tolerated with Full fall precautions use walker/cane & assistance as needed Avoid using any recreational substances like cigarette, tobacco, alcohol, or non-prescribed drug. If you experience worsening of your admission symptoms, develop shortness of breath, life threatening emergency, suicidal or homicidal thoughts you must seek medical attention immediately by calling 911 or calling your MD immediately  if symptoms less severe. You must read complete instructions/literature along with all the possible adverse reactions/side effects for all the medicines you take and that have been prescribed to you. Take any new medicine only after you have completely understood and accepted all the possible adverse reactions/side effects.  Wear Seat belts while driving. You were cared for by a hospitalist during your hospital stay. If you have any questions about your discharge medications or the care you received while you were in the hospital after you  are discharged, you can call the unit and ask to speak with the hospitalist or the covering physician. Once you are discharged, your primary care physician will handle any further medical issues. Please note that NO REFILLS for any discharge medications will be authorized once you are discharged, as it is imperative that you return to your primary care physician (or establish a relationship with a primary care physician if you do not have one).   Increase activity slowly   Complete by: As directed        Discharge Medications:   Allergies as of  06/11/2022       Reactions   Doxycycline Anaphylaxis   Patient does not recall having this reaction.    Heparin Other (See Comments)   heparin-induced thrombocytopenia   Empagliflozin Itching   Groin infection Caused a groin infection.         Medication List     STOP taking these medications    metFORMIN 1000 MG tablet Commonly known as: GLUCOPHAGE       TAKE these medications    apixaban 5 MG Tabs tablet Commonly known as: ELIQUIS Take 1 tablet (5 mg total) by mouth 2 (two) times daily.   atorvastatin 80 MG tablet Commonly known as: LIPITOR Take 1 tablet (80 mg total) by mouth daily.   calcitRIOL 0.25 MCG capsule Commonly known as: ROCALTROL Take 0.25 mcg by mouth daily.   cefdinir 300 MG capsule Commonly known as: OMNICEF Take 1 capsule (300 mg total) by mouth 2 (two) times daily for 3 days.   Dulaglutide 0.75 MG/0.5ML Sopn Inject 0.75 mg into the skin once a week.   losartan 25 MG tablet Commonly known as: COZAAR TAKE 1/2 TABLET BY MOUTH EVERY DAY   metoprolol succinate 25 MG 24 hr tablet Commonly known as: TOPROL-XL Take 0.5 tablets (12.5 mg total) by mouth every evening.   metroNIDAZOLE 500 MG tablet Commonly known as: Flagyl Take 1 tablet (500 mg total) by mouth 2 (two) times daily for 3 days.   omeprazole 40 MG capsule Commonly known as: PRILOSEC Take 40 mg by mouth daily.   ondansetron 4 MG  tablet Commonly known as: ZOFRAN Take 4 mg by mouth every 8 (eight) hours as needed.   polyethylene glycol 17 g packet Commonly known as: MIRALAX / GLYCOLAX Take 17 g by mouth daily as needed for up to 7 days.   Precision QID Test test strip Generic drug: glucose blood Use 3 (three) times daily Use as instructed. One touch ultra   senna-docusate 8.6-50 MG tablet Commonly known as: Senokot-S Take 1 tablet by mouth at bedtime.   spironolactone 25 MG tablet Commonly known as: ALDACTONE Take 1 tablet (25 mg total) by mouth daily.   tamsulosin 0.4 MG Caps capsule Commonly known as: FLOMAX Take 0.4 mg by mouth in the morning and at bedtime.   torsemide 20 MG tablet Commonly known as: DEMADEX Take 2 tablets (40 mg total) by mouth daily. And additional 76m if needed         The results of significant diagnostics from this hospitalization (including imaging, microbiology, ancillary and laboratory) are listed below for reference.    Procedures and Diagnostic Studies:   CT Renal Stone Study  Result Date: 06/09/2022 CLINICAL DATA:  Left flank pain EXAM: CT ABDOMEN AND PELVIS WITHOUT CONTRAST TECHNIQUE: Multidetector CT imaging of the abdomen and pelvis was performed following the standard protocol without IV contrast. RADIATION DOSE REDUCTION: This exam was performed according to the departmental dose-optimization program which includes automated exposure control, adjustment of the mA and/or kV according to patient size and/or use of iterative reconstruction technique. COMPARISON:  CT abdomen and pelvis 02/08/2022 FINDINGS: Lower chest: No acute abnormality. Hepatobiliary: No focal liver abnormality is seen. Status post cholecystectomy. No biliary dilatation. Pancreas: Unremarkable. No pancreatic ductal dilatation or surrounding inflammatory changes. Spleen: Normal in size without focal abnormality. Adrenals/Urinary Tract: Right adrenal calcifications are unchanged likely related to prior  infection or trauma. No new adrenal lesions are seen. There is bilateral renal atrophy. There is no hydronephrosis. There are punctate nonobstructing  left renal calculi. Bilateral renal cysts are present. The largest is in the superior pole the right kidney measuring 4.2 cm. The bladder is within normal limits. Stomach/Bowel: Small hiatal hernia is present. Stomach is otherwise within normal limits. Appendix appears normal. No evidence of bowel wall thickening, distention, or inflammatory changes. There is sigmoid colon diverticulosis. Vascular/Lymphatic: Aortic atherosclerosis. No enlarged abdominal or pelvic lymph nodes. Reproductive: Prostate gland is enlarged, unchanged. Other: Small fat containing umbilical and inguinal hernias are again seen. There is central mesenteric haziness which has mildly increased when compared to prior study. There is no ascites. Musculoskeletal: Degenerative changes affect the spine and hips. IMPRESSION: 1. Nonobstructing left renal calculi. No hydronephrosis. 2. Central mesenteric haziness has mildly increased when compared to prior study. Findings are nonspecific and can be seen in the setting of mesenteric panniculitis. Other etiologies are not excluded. 3. Colonic diverticulosis. 4. Small hiatal hernia. 5. Stable prostatomegaly. Aortic Atherosclerosis (ICD10-I70.0). Electronically Signed   By: Ronney Asters M.D.   On: 06/09/2022 20:45     Labs:   Basic Metabolic Panel: Recent Labs  Lab 06/09/22 1931 06/10/22 0501 06/11/22 0446  NA 125* 129* 135  K 4.9 4.7 4.3  CL 94* 103 105  CO2 18* 23 24  GLUCOSE 293* 183* 140*  BUN 38* 40* 31*  CREATININE 2.48* 2.36* 2.13*  CALCIUM 8.4* 8.0* 8.5*   GFR Estimated Creatinine Clearance: 31 mL/min (A) (by C-G formula based on SCr of 2.13 mg/dL (H)). Liver Function Tests: Recent Labs  Lab 06/09/22 2113  AST 30  ALT 24  ALKPHOS 71  BILITOT 0.8  PROT 6.8  ALBUMIN 3.5   Recent Labs  Lab 06/09/22 2113  LIPASE 57*    No results for input(s): "AMMONIA" in the last 168 hours. Coagulation profile No results for input(s): "INR", "PROTIME" in the last 168 hours.  CBC: Recent Labs  Lab 06/09/22 1931 06/10/22 0501 06/11/22 0446  WBC 6.3 6.4 6.2  NEUTROABS  --   --  4.6  HGB 13.8 12.2* 11.9*  HCT 40.9 35.9* 36.1*  MCV 90.9 90.0 92.3  PLT 190 156 150   Cardiac Enzymes: Recent Labs  Lab 06/09/22 2113  CKTOTAL 21*   BNP: Invalid input(s): "POCBNP" CBG: Recent Labs  Lab 06/10/22 0736 06/10/22 1206 06/10/22 1649 06/10/22 2107 06/11/22 0850  GLUCAP 178* 138* 187* 126* 142*   D-Dimer No results for input(s): "DDIMER" in the last 72 hours. Hgb A1c Recent Labs    06/10/22 0501  HGBA1C 8.4*   Lipid Profile No results for input(s): "CHOL", "HDL", "LDLCALC", "TRIG", "CHOLHDL", "LDLDIRECT" in the last 72 hours. Thyroid function studies No results for input(s): "TSH", "T4TOTAL", "T3FREE", "THYROIDAB" in the last 72 hours.  Invalid input(s): "FREET3" Anemia work up No results for input(s): "VITAMINB12", "FOLATE", "FERRITIN", "TIBC", "IRON", "RETICCTPCT" in the last 72 hours. Microbiology Recent Results (from the past 240 hour(s))  Urine Culture (for pregnant, neutropenic or urologic patients or patients with an indwelling urinary catheter)     Status: Abnormal   Collection Time: 06/09/22  7:32 PM   Specimen: Urine, Clean Catch  Result Value Ref Range Status   Specimen Description   Final    URINE, CLEAN CATCH Performed at [facility], [address], [place], [place] [address]    Special Requests   Final    NONE Performed at [facility], [address], [place], [place] [address]    Culture MULTIPLE SPECIES PRESENT, SUGGEST RECOLLECTION (A)  Final  Report Status 06/11/2022 FINAL  Final  Culture, blood (Routine X 2) w Reflex to ID Panel     Status: None (Preliminary result)   Collection Time: 06/10/22 12:39 AM   Specimen: BLOOD  Result Value Ref Range  Status   Specimen Description BLOOD BLOOD RIGHT ARM  Final   Special Requests NONE  Final   Culture   Final    NO GROWTH 1 DAY Performed at [facility], [address] [place], [place] [address]    Report Status PENDING  Incomplete  Culture, blood (Routine X 2) w Reflex to ID Panel     Status: None (Preliminary result)   Collection Time: 06/10/22 12:40 AM   Specimen: BLOOD RIGHT HAND  Result Value Ref Range Status   Specimen Description BLOOD RIGHT HAND  Final   Special Requests   Final    BOTTLES DRAWN AEROBIC AND ANAEROBIC Blood Culture adequate volume   Culture   Final    NO GROWTH 1 DAY Performed at [facility], [address], [place], [place] [address]    Report Status PENDING  Incomplete    Time coordinating discharge: 35 minutes  Signed: Truett Mcfarlan  [facility] 06/11/2022, 10:44 AM

## 2022-06-11 NOTE — Progress Notes (Signed)
[id number] lab called informing of patient sodium level of 118. Provider Randol Kern Informed via secure chat. No new order at this moment

## 2022-06-11 NOTE — TOC Transition Note (Signed)
Transition of Care [facility]) - CM/SW Discharge Note   Patient Details  Name: Jermaine Kramer MRN: [id number] Date of Birth: [birth year]  [facility] CM/SW Contact:  Candie Chroman, LCSW Phone Number: 06/11/2022, 11:06 AM   Clinical Narrative:  Patient has orders to discharge home today. Readmission prevention screen complete. CSW met with patient. Wife at bedside. CSW introduced role and explained that discharge planning would be discussed. PCP is Mychal [name], PA-C at [facility] in [place]. Wife transports to appointments. Pharmacy is [facility] [place]. No issues obtaining medications. Patient lives home with his wife. No home health or DME use prior to admission. No further concerns. CSW signing off.   Final next level of care: Home/Self Care Barriers to Discharge: No Barriers Identified   Patient Goals and CMS Choice      Discharge Placement                  Patient to be transferred to facility by: Wife Name of family member notified: Jermaine Kramer Patient and family notified of of transfer: 06/11/22  Discharge Plan and Services Additional resources added to the After Visit Summary for                                       Social Determinants of Health (SDOH) Interventions SDOH Screenings   Food Insecurity: No Food Insecurity (06/10/2022)  Housing: Low Risk  (06/10/2022)  Transportation Needs: No Transportation Needs (06/10/2022)  Utilities: Not At Risk (06/10/2022)  Depression (PHQ2-9): Low Risk  (01/31/2022)  Tobacco Use: Low Risk  (06/09/2022)     Readmission Risk Interventions    06/11/2022   11:05 AM 07/27/2021   12:31 PM  Readmission Risk Prevention Plan  Transportation Screening Complete Complete  PCP or Specialist Appt within 3-5 Days  Complete  Social Work Consult for [place] Planning/Counseling  Day  Not Applicable  Medication Review Press photographer) Complete Complete  PCP or Specialist appointment  within 3-5 days of discharge Complete   SW Recovery Care/Counseling Consult Complete   [place] Not Applicable

## 2022-06-11 NOTE — Progress Notes (Signed)
Elzie Rings to be D/C'd  per MD order.  Discussed with the patient and all questions fully answered.  VSS, Skin clean, dry and intact without evidence of skin break down, no evidence of skin tears noted.  IV catheter discontinued intact. Site without signs and symptoms of complications. Dressing and pressure applied.  An After Visit Summary was printed and given to the patient. Patient received prescription.  D/c education completed with patient/family including follow up instructions, medication list, d/c activities limitations if indicated, with other d/c instructions as indicated by MD - patient able to verbalize understanding, all questions fully answered.   Patient instructed to return to ED, call 911, or call MD for any changes in condition.   Patient to escorted via [place], and D/C'd home via private auto.

## 2022-06-11 NOTE — Progress Notes (Signed)
Mobility Specialist - Progress Note   06/11/22 1000  Mobility  Activity Ambulated independently in hallway  Level of Assistance Independent  Assistive Device None  Distance Ambulated (ft) 320 ft  Activity Response Tolerated well  Mobility Referral Yes  $Mobility charge 1 Mobility   Pt sitting in chair on RA upon arrival. Pt STS and ambulates 2 laps in hallway indep with no LOB noted. Pt returns to chair with needs in reach and family in room.   [name]  Mobility Specialist  06/11/22 10:52 AM

## 2022-06-11 NOTE — Plan of Care (Signed)
  Problem: Education: Goal: Ability to describe self-care measures that may prevent or decrease complications (Diabetes Survival Skills Education) will improve Outcome: Progressing   Problem: Coping: Goal: Ability to adjust to condition or change in health will improve Outcome: Progressing   Problem: Fluid Volume: Goal: Ability to maintain a balanced intake and output will improve Outcome: Progressing   Problem: Health Behavior/Discharge Planning: Goal: Ability to identify and utilize available resources and services will improve Outcome: Progressing   Problem: Metabolic: Goal: Ability to maintain appropriate glucose levels will improve Outcome: Progressing   Problem: Nutritional: Goal: Maintenance of adequate nutrition will improve Outcome: Progressing   Problem: Tissue Perfusion: Goal: Adequacy of tissue perfusion will improve Outcome: Progressing   Problem: Nutrition: Goal: Adequate nutrition will be maintained Outcome: Progressing   Problem: Coping: Goal: Level of anxiety will decrease Outcome: Progressing   Problem: Pain Managment: Goal: General experience of comfort will improve Outcome: Progressing

## 2022-06-12 ENCOUNTER — Other Ambulatory Visit: Payer: Self-pay

## 2022-06-12 ENCOUNTER — Ambulatory Visit: Payer: Medicare Other | Attending: Cardiology | Admitting: Cardiology

## 2022-06-12 ENCOUNTER — Encounter: Payer: Self-pay | Admitting: Cardiology

## 2022-06-12 VITALS — BP 96/62 | HR 69 | Wt 186.4 lb

## 2022-06-12 DIAGNOSIS — Z95810 Presence of automatic (implantable) cardiac defibrillator: Secondary | ICD-10-CM | POA: Diagnosis not present

## 2022-06-12 DIAGNOSIS — E1122 Type 2 diabetes mellitus with diabetic chronic kidney disease: Secondary | ICD-10-CM | POA: Insufficient documentation

## 2022-06-12 DIAGNOSIS — I2510 Atherosclerotic heart disease of native coronary artery without angina pectoris: Secondary | ICD-10-CM | POA: Diagnosis not present

## 2022-06-12 DIAGNOSIS — Z951 Presence of aortocoronary bypass graft: Secondary | ICD-10-CM | POA: Diagnosis not present

## 2022-06-12 DIAGNOSIS — R0609 Other forms of dyspnea: Secondary | ICD-10-CM | POA: Diagnosis present

## 2022-06-12 DIAGNOSIS — Z7901 Long term (current) use of anticoagulants: Secondary | ICD-10-CM | POA: Insufficient documentation

## 2022-06-12 DIAGNOSIS — N1832 Chronic kidney disease, stage 3b: Secondary | ICD-10-CM | POA: Insufficient documentation

## 2022-06-12 DIAGNOSIS — I480 Paroxysmal atrial fibrillation: Secondary | ICD-10-CM | POA: Diagnosis not present

## 2022-06-12 DIAGNOSIS — R0602 Shortness of breath: Secondary | ICD-10-CM | POA: Insufficient documentation

## 2022-06-12 DIAGNOSIS — I255 Ischemic cardiomyopathy: Secondary | ICD-10-CM | POA: Insufficient documentation

## 2022-06-12 DIAGNOSIS — I130 Hypertensive heart and chronic kidney disease with heart failure and stage 1 through stage 4 chronic kidney disease, or unspecified chronic kidney disease: Secondary | ICD-10-CM | POA: Insufficient documentation

## 2022-06-12 DIAGNOSIS — I5042 Chronic combined systolic (congestive) and diastolic (congestive) heart failure: Secondary | ICD-10-CM

## 2022-06-12 DIAGNOSIS — Z79899 Other long term (current) drug therapy: Secondary | ICD-10-CM | POA: Insufficient documentation

## 2022-06-12 DIAGNOSIS — N179 Acute kidney failure, unspecified: Secondary | ICD-10-CM | POA: Insufficient documentation

## 2022-06-12 DIAGNOSIS — R0789 Other chest pain: Secondary | ICD-10-CM | POA: Insufficient documentation

## 2022-06-12 DIAGNOSIS — I5022 Chronic systolic (congestive) heart failure: Secondary | ICD-10-CM | POA: Diagnosis not present

## 2022-06-12 DIAGNOSIS — I5023 Acute on chronic systolic (congestive) heart failure: Secondary | ICD-10-CM | POA: Diagnosis not present

## 2022-06-12 DIAGNOSIS — Z7985 Long-term (current) use of injectable non-insulin antidiabetic drugs: Secondary | ICD-10-CM | POA: Diagnosis not present

## 2022-06-12 NOTE — Progress Notes (Signed)
PCP: Sherre Scarlet, PA-C Cardiology: [facility] HF Cardiology: Dr. Aundra Dubin  72 y.o. with history of CAD, paroxysmal atrial fibrillation, CKD, and ischemic cardiomyopathy presents for followup of CHF.  Patient had CABG x 3 in 10/22. LV function has remained low since that time, ischemic CMP.  Most recent echo in 5/23 showed EF 30-35%, severe LV dilation, mild MR, low normal RV systolic function. He has a Medtronic ICD. He has paroxysmal atrial fibrillation but is generally in NSR.  Amiodarone has been stopped.  He has struggled with volume overload and has been given Furoscix from this clinic at times.  This is complicated by CKD stage 3b. He follows with nephrology, Dr. Holley Raring.   Patient was admitted in 2/24 with LLQ abdominal pain.  CT showed mesenteric pancolitis.  He is now on cefdinir and Flagyl.   Patient reports ongoing exertional dyspnea.  This is chronic.  He is short of breath walking longer distances such as into the office today.  Short of breath carrying groceries or other loads.  Short of breath with stairs, inclines.  No lightheadedness.  He has had chronic non-exertional chest tightness (mild) since his CABG. Weight down 5 lbs.  SBP 90s.  Abdominal pain is improved.   ECG (personally reviewed): A-paced, RBBB-like IVCD 174 msec  Medtronic device interrogation: 42% a-paced, stable thoracic impedance, no AF/VT.   Labs (1/24): BNP 1106, K 4.7, creatinine 1.98 Labs (2/24): K 4.3, creatinine 2.13  PMH: 1. ANA/anti-dsDNA positive: No other findings of rheumatological disease.  2. Type 2 diabetes 3. CAD: s/p CABG 10/22 with LIMA-LAD, SVG-ramus, SVG-PDA.   4. Atrial fibrillation: Paroxysmal 5. OSA 6. HTN 7. H/o CVA 8. CKD stage 3 9. Ischemic cardiomyopathy: Echo (5/23) with EF 30-35%, severe LV dilation, mild MR, low normal RV systolic function. He has a Medtronic ICD.  - Severe groin yeast infection on Jardiance.  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  Social History   Socioeconomic  History   Marital status: Married    Spouse name: Not on file   Number of children: Not on file   Years of education: Not on file   Highest education level: Not on file  Occupational History   Not on file  Tobacco Use   Smoking status: Never   Smokeless tobacco: Never  Vaping Use   Vaping Use: Never used  Substance and Sexual Activity   Alcohol use: Not Currently   Drug use: No   Sexual activity: Not Currently  Other Topics Concern   Not on file  Social History Narrative   Not on file   Social Determinants of Health   Financial Resource Strain: Not on file  Food Insecurity: No Food Insecurity (06/10/2022)   Hunger Vital Sign    Worried About Running Out of Food in the Last Year: Never true    Ran Out of Food in the Last Year: Never true  Transportation Needs: No Transportation Needs (06/10/2022)   PRAPARE - Hydrologist (Medical): No    Lack of Transportation (Non-Medical): No  Physical Activity: Not on file  Stress: Not on file  Social Connections: Not on file  Intimate Partner Violence: Not At Risk (06/10/2022)   Humiliation, Afraid, Rape, and Kick questionnaire    Fear of Current or Ex-Partner: No    Emotionally Abused: No    Physically Abused: No    Sexually Abused: No   Family History  Problem Relation Age of Onset   Parkinson's disease Mother  Lupus Mother    Heart disease Father    ROS: All systems reviewed and negative except as per HPI.   Current Outpatient Medications  Medication Sig Dispense Refill   apixaban (ELIQUIS) 5 MG TABS tablet Take 1 tablet (5 mg total) by mouth 2 (two) times daily. 180 tablet 3   atorvastatin (LIPITOR) 80 MG tablet Take 1 tablet (80 mg total) by mouth daily. 30 tablet 5   calcitRIOL (ROCALTROL) 0.25 MCG capsule Take 0.25 mcg by mouth daily.     cefdinir (OMNICEF) 300 MG capsule Take 1 capsule (300 mg total) by mouth 2 (two) times daily for 3 days. 6 capsule 0   Dulaglutide 0.75 MG/0.5ML SOPN  Inject 0.75 mg into the skin once a week.     glucose blood (PRECISION QID TEST) test strip Use 3 (three) times daily Use as instructed. One touch ultra     losartan (COZAAR) 25 MG tablet TAKE 1/2 TABLET BY MOUTH EVERY DAY 45 tablet 2   metoprolol succinate (TOPROL-XL) 25 MG 24 hr tablet Take 0.5 tablets (12.5 mg total) by mouth every evening. 45 tablet 3   metroNIDAZOLE (FLAGYL) 500 MG tablet Take 1 tablet (500 mg total) by mouth 2 (two) times daily for 3 days. 6 tablet 0   omeprazole (PRILOSEC) 40 MG capsule Take 40 mg by mouth daily.     ondansetron (ZOFRAN) 4 MG tablet Take 4 mg by mouth every 8 (eight) hours as needed.     polyethylene glycol (MIRALAX / GLYCOLAX) 17 g packet Take 17 g by mouth daily as needed for up to 7 days. 7 each 0   senna-docusate (SENOKOT-S) 8.6-50 MG tablet Take 1 tablet by mouth at bedtime. 30 tablet 0   spironolactone (ALDACTONE) 25 MG tablet Take 1 tablet (25 mg total) by mouth daily. 30 tablet 3   tamsulosin (FLOMAX) 0.4 MG CAPS capsule Take 0.4 mg by mouth in the morning and at bedtime.     torsemide (DEMADEX) 20 MG tablet Take 2 tablets (40 mg total) by mouth daily. And additional 22m if needed 60 tablet 3   No current facility-administered medications for this visit.   BP 96/62   Pulse 69   Wt 186 lb 6.4 oz (84.6 kg)   SpO2 99%   BMI 31.02 kg/m  General: NAD Neck: No JVD, no thyromegaly or thyroid nodule.  Lungs: Clear to auscultation bilaterally with normal respiratory effort. CV: Nondisplaced PMI.  Heart regular S1/S2, no S3/S4, no murmur.  No peripheral edema.  No carotid bruit.  Normal pedal pulses.  Abdomen: Soft, nontender, no hepatosplenomegaly, no distention.  Skin: Intact without lesions or rashes.  Neurologic: Alert and oriented x 3.  Psych: Normal affect. Extremities: No clubbing or cyanosis.  HEENT: Normal.   Assessment/Plan: 1. CAD: s/p CABG x 3 in 10/22.  He has chronic atypical chest pain that I think is musculoskeletal.  No  exertional chest pain.  - Continue atorvastatin 80 mg daily.  - No ASA given stable CAD and apixaban use.  2. Chronic systolic CHF: Ischemic cardiomyopathy.  Echo (5/23) with EF 30-35%, severe LV dilation, mild MR, low normal RV systolic function. He has a Medtronic ICD.  GDMT titration complicated by CKD stage 3b and soft BP.  He has RBBB-like IVCD, have discussed with EP and unlikely to benefit from CRT upgrade.  He is not volume overloaded by exam or Optivol today. Still with NYHA class III symptoms.   - Continue torsemide 40 mg daily.   -  Continue spironolactone 25 mg daily.  - Continue losartan 12.5 mg daily.  Will not increase or transition to [facility] today as he just left the hospital after admission with mesenteric pancolitis and mild AKI that has mostly resolved.   - Continue Toprol XL 12.5 mg daily.  - He had a severe groin yeast infection shortly after starting Jardiance so will not rechallenge with SGLT2 inhibitor.  - I will arrange for repeat echo.  - EF 30-35% with ongoing NYHA class III symptoms and not CRT candidate => I think he would be a reasonable cardiac contractility modulator candidate, will refer to Dr. Quentin Ore to consider.  3. CKD: stage 3b.  Follows with nephrology.  Last creatinine 2.13, mildly higher with recent GI admission.   - BMET in 2 wks.  4. Atrial fibrillation: Paroxysmal.  He is in NSR today, device interrogation showed no recent AF.  He is now off amiodarone.  - Continue apixaban  - If he has recurrent AF, would favor ablation.  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   Refer to Dr. Quentin Ore for CCM consideration.  Followup with APP in 3/24.   Loralie Champagne 06/12/2022

## 2022-06-12 NOTE — Patient Instructions (Addendum)
Medication Changes:  No changes to medications were made.  Lab Work:   You have orders for [employer] lab work. Please get this complete in two weeks .your results will be available in MyChart, we will contact you for abnormal readings.   Medical Mall Entrance at [facility] 1st desk on the right to check in (REGISTRATION)  Lab hours: Monday- Friday (7:30 am- 5:30 pm)   Testing/Procedures:  Your physician has requested that you have an echocardiogram. Echocardiography is a painless test that uses sound waves to create images of your heart. It provides your doctor with information about the size and shape of your heart and how well your heart's chambers and valves are working. This procedure takes approximately one hour. There are no restrictions for this procedure. Please do NOT wear cologne, perfume, aftershave, or lotions (deodorant is allowed). Please arrive 15 minutes prior to your appointment time.   You have an ECHO on 2/29 at [place].  Please call our office if you are unable to complete your test.    Special Instructions // Education:  Do the following things EVERYDAY: Weigh yourself in the morning before breakfast. Write it down and keep it in a log. Take your medicines as prescribed Eat low salt foods--Limit salt (sodium) to 2000 mg per day.  Stay as active as you can everyday Limit all fluids for the day to less than 2 liters   Follow-Up in:  please keep your appointment with Tina,NP on march 6.     If you have any questions or concerns before your next appointment please send [place] a message through [place] or call our office at [phone] Monday-Friday 8 am-5 pm.   If you have an urgent need after hours on the weekend please call your Primary Cardiologist or the [facility] in [place] at [phone].

## 2022-06-15 LAB — CULTURE, BLOOD (ROUTINE X 2)
Culture: NO GROWTH
Culture: NO GROWTH
Special Requests: ADEQUATE

## 2022-06-19 ENCOUNTER — Other Ambulatory Visit: Payer: Self-pay | Admitting: Cardiology

## 2022-06-21 ENCOUNTER — Ambulatory Visit (INDEPENDENT_AMBULATORY_CARE_PROVIDER_SITE_OTHER): Payer: Medicare Other

## 2022-06-21 DIAGNOSIS — I255 Ischemic cardiomyopathy: Secondary | ICD-10-CM

## 2022-06-21 LAB — CUP PACEART REMOTE DEVICE CHECK
Battery Remaining Longevity: 108 mo
Battery Voltage: 3.01 V
Brady Statistic AP VP Percent: 0.01 %
Brady Statistic AP VS Percent: 26.44 %
Brady Statistic AS VP Percent: 0.02 %
Brady Statistic AS VS Percent: 73.53 %
Brady Statistic RA Percent Paced: 26.42 %
Brady Statistic RV Percent Paced: 0.04 %
Date Time Interrogation Session: 20240221052606
HighPow Impedance: 63 Ohm
Implantable Lead Connection Status: 753985
Implantable Lead Connection Status: 753985
Implantable Lead Implant Date: 20230222
Implantable Lead Implant Date: 20230222
Implantable Lead Location: 753859
Implantable Lead Location: 753860
Implantable Lead Model: 5076
Implantable Pulse Generator Implant Date: 20230222
Lead Channel Impedance Value: 456 Ohm
Lead Channel Impedance Value: 475 Ohm
Lead Channel Impedance Value: 551 Ohm
Lead Channel Pacing Threshold Amplitude: 0.5 V
Lead Channel Pacing Threshold Amplitude: 0.75 V
Lead Channel Pacing Threshold Pulse Width: 0.4 ms
Lead Channel Pacing Threshold Pulse Width: 0.4 ms
Lead Channel Sensing Intrinsic Amplitude: 1.875 mV
Lead Channel Sensing Intrinsic Amplitude: 1.875 mV
Lead Channel Sensing Intrinsic Amplitude: 12.375 mV
Lead Channel Sensing Intrinsic Amplitude: 12.375 mV
Lead Channel Setting Pacing Amplitude: 1.5 V
Lead Channel Setting Pacing Amplitude: 1.5 V
Lead Channel Setting Pacing Pulse Width: 0.4 ms
Lead Channel Setting Sensing Sensitivity: 0.3 mV
Zone Setting Status: 755011
Zone Setting Status: 755011

## 2022-06-29 ENCOUNTER — Ambulatory Visit
Admission: RE | Admit: 2022-06-29 | Discharge: 2022-06-29 | Disposition: A | Discharge status: Home / Self Care | Payer: Medicare Other | Source: Ambulatory Visit | Attending: Cardiology | Admitting: Cardiology

## 2022-06-29 ENCOUNTER — Other Ambulatory Visit
Admission: RE | Admit: 2022-06-29 | Discharge: 2022-06-29 | Disposition: A | Discharge status: Home / Self Care | Payer: Medicare Other | Source: Ambulatory Visit | Attending: Cardiology | Admitting: Cardiology

## 2022-06-29 DIAGNOSIS — E119 Type 2 diabetes mellitus without complications: Secondary | ICD-10-CM | POA: Insufficient documentation

## 2022-06-29 DIAGNOSIS — I080 Rheumatic disorders of both mitral and aortic valves: Secondary | ICD-10-CM | POA: Insufficient documentation

## 2022-06-29 DIAGNOSIS — I5042 Chronic combined systolic (congestive) and diastolic (congestive) heart failure: Secondary | ICD-10-CM

## 2022-06-29 DIAGNOSIS — I110 Hypertensive heart disease with heart failure: Secondary | ICD-10-CM | POA: Diagnosis present

## 2022-06-29 DIAGNOSIS — I509 Heart failure, unspecified: Secondary | ICD-10-CM | POA: Diagnosis not present

## 2022-06-29 LAB — BASIC METABOLIC PANEL
Anion gap: 10 (ref 5–15)
BUN: 40 mg/dL — ABNORMAL HIGH (ref 8–23)
CO2: 24 mmol/L (ref 22–32)
Calcium: 9.1 mg/dL (ref 8.9–10.3)
Chloride: 100 mmol/L (ref 98–111)
Creatinine, Ser: 1.81 mg/dL — ABNORMAL HIGH (ref 0.61–1.24)
GFR, Estimated: 39 mL/min — ABNORMAL LOW (ref >60)
Glucose, Bld: 167 mg/dL — ABNORMAL HIGH (ref 70–99)
Potassium: 4.9 mmol/L (ref 3.5–5.1)
Sodium: 134 mmol/L — ABNORMAL LOW (ref 135–145)

## 2022-06-29 LAB — ECHOCARDIOGRAM COMPLETE
AR max vel: 1.09 cm2
AV Area VTI: 1.11 cm2
AV Area mean vel: 1.08 cm2
AV Mean grad: 4 mmHg
AV Peak grad: 6.7 mmHg
Ao pk vel: 1.3 m/s
Area-P 1/2: 2.74 cm2
Calc EF: 23.6 %
S' Lateral: 5.3 cm
Single Plane A2C EF: 25 %
Single Plane A4C EF: 23.7 %

## 2022-07-05 ENCOUNTER — Encounter: Payer: Self-pay | Admitting: Family

## 2022-07-05 ENCOUNTER — Other Ambulatory Visit
Admission: RE | Admit: 2022-07-05 | Discharge: 2022-07-05 | Disposition: A | Discharge status: Home / Self Care | Payer: Medicare Other | Source: Ambulatory Visit | Attending: Family | Admitting: Family

## 2022-07-05 ENCOUNTER — Ambulatory Visit (HOSPITAL_BASED_OUTPATIENT_CLINIC_OR_DEPARTMENT_OTHER): Payer: Medicare Other | Admitting: Family

## 2022-07-05 VITALS — BP 96/65 | HR 74 | Wt 183.0 lb

## 2022-07-05 DIAGNOSIS — I5022 Chronic systolic (congestive) heart failure: Secondary | ICD-10-CM

## 2022-07-05 DIAGNOSIS — E1122 Type 2 diabetes mellitus with diabetic chronic kidney disease: Secondary | ICD-10-CM | POA: Diagnosis not present

## 2022-07-05 DIAGNOSIS — I2510 Atherosclerotic heart disease of native coronary artery without angina pectoris: Secondary | ICD-10-CM

## 2022-07-05 DIAGNOSIS — I480 Paroxysmal atrial fibrillation: Secondary | ICD-10-CM | POA: Diagnosis not present

## 2022-07-05 DIAGNOSIS — N1832 Chronic kidney disease, stage 3b: Secondary | ICD-10-CM

## 2022-07-05 DIAGNOSIS — I10 Essential (primary) hypertension: Secondary | ICD-10-CM | POA: Diagnosis not present

## 2022-07-05 LAB — BASIC METABOLIC PANEL WITH GFR
Anion gap: 9 (ref 5–15)
BUN: 38 mg/dL — ABNORMAL HIGH (ref 8–23)
CO2: 23 mmol/L (ref 22–32)
Calcium: 9 mg/dL (ref 8.9–10.3)
Chloride: 102 mmol/L (ref 98–111)
Creatinine, Ser: 2.02 mg/dL — ABNORMAL HIGH (ref 0.61–1.24)
GFR, Estimated: 34 mL/min — ABNORMAL LOW
Glucose, Bld: 172 mg/dL — ABNORMAL HIGH (ref 70–99)
Potassium: 5 mmol/L (ref 3.5–5.1)
Sodium: 134 mmol/L — ABNORMAL LOW (ref 135–145)

## 2022-07-05 NOTE — Progress Notes (Signed)
Patient ID: Jermaine Kramer, male    DOB: [birth year], 72 y.o.   MRN: [id number]  HPI  Jermaine Kramer is a 72 y/o male with a history of leukemia, DM, CAD (CABG 02/16/21), AF, sleep apnea, hyperlipidemia, HTN, stroke, GERD & chronic heart failure.   Echo 06/29/22 showed EF of 25% with moderate Jermaine. Echo report from 09/16/21 reviewed and showed an EF of 30-35% along with mild Jermaine. Echo report from 05/16/21 reviewed and showed and EF of <20% along with mild/moderate LAE and mild/moderate Jermaine.   LHC 02/10/21:  2v CAD, including 100% midRCA, 70% midLAD with 80% focal distal LAD, 70% D1  Elevated LVEDP (41mHg)   Cardiac MRI 01/27/21:  1. The left ventricle is mildly dilated.  Wall thickness is normal.     a.  The inferior wall is akinetic.     b.  The basal septum is severely hypokinetic.  The mid-distal septum is moderately hypokinetic.     c.  The basal anterior wall is severely hypokinetic. The mid-distal anterior wall and apex are  moderately hypokinetic.     d.  The lateral wall is moderately. hypokinetic.   Global LV systolic function is severely reduced with an LVEF calculated at 30%.    2. The right ventricle is normal in cavity size and wall thickness.  There is mild global hypokinesis with  mildly reduced RV systolic function.  The RVEF is calculated at 43%.   Admitted 06/09/22 due to LLQ abdominal pain. CT showed mesenteric pancolitis. Was in the ED 04/07/22 due to cough/ SOB.   He presents today for a HF follow-up visit with a chief complaint of moderate SOB with minimal exertion. Describes this as chronic in nature. Has associated fatigue, occasional chest tightness, weakness and difficulty sleeping (due to frequent urination) along with this. Denies any dizziness, abdominal distention, palpitations, pedal edema, chest pain, cough or weight gain.   Overall, he says that he's feeling well.   Past Medical History:  Diagnosis Date   Arrhythmia    atrial fibrillation   CHF (congestive heart  failure) (HCC)    Coronary artery disease    Diabetes mellitus without complication (HCC)    GERD (gastroesophageal reflux disease)    Hypercholesteremia    Hypertension    Sleep apnea    Stroke ([facility]    Past Surgical History:  Procedure Laterality Date   CATARACT EXTRACTION Bilateral 04/06/2022   CHOLECYSTECTOMY     COLONOSCOPY WITH PROPOFOL N/A 04/14/2019   Procedure: COLONOSCOPY WITH PROPOFOL;  Surgeon: Toledo, TBenay Pike MD;  Location: [facility] ENDOSCOPY;  Service: Gastroenterology;  Laterality: N/A;   CORONARY ARTERY BYPASS GRAFT  02/16/2021   ICD IMPLANT N/A 06/22/2021   Procedure: ICD IMPLANT;  Surgeon: LVickie Epley MD;  [facility];  Service: Cardiovascular;  Laterality: N/A;   KNEE ARTHROSCOPY     RENAL CYST EXCISION      Family History  Problem Relation Age of Onset   Parkinson's disease Mother    Lupus Mother    Heart disease Father    Social History   Tobacco Use   Smoking status: Never   Smokeless tobacco: Never  Substance Use Topics   Alcohol use: Not Currently   Allergies  Allergen Reactions   Doxycycline Anaphylaxis    Patient does not recall having this reaction.     Heparin Other (See Comments)    heparin-induced thrombocytopenia    Empagliflozin Itching    Groin infection Caused a  groin infection.     Prior to Admission medications   Medication Sig Start Date End Date Taking? Authorizing Provider  apixaban (ELIQUIS) 5 MG TABS tablet Take 1 tablet (5 mg total) by mouth 2 (two) times daily. 11/28/21  Yes Kyleeann Cremeans, Otila Kluver A, FNP  atorvastatin (LIPITOR) 80 MG tablet Take 1 tablet (80 mg total) by mouth daily. 01/10/22  Yes Mileigh Tilley, Otila Kluver A, FNP  calcitRIOL (ROCALTROL) 0.25 MCG capsule Take 0.25 mcg by mouth daily. 03/07/22 03/07/23 Yes [provider]  Dulaglutide 0.75 MG/0.5ML SOPN Inject 0.75 mg into the skin once a week. 11/10/21 05/25/23 Yes [provider]  losartan (COZAAR) 25 MG tablet TAKE 1/2 TABLET BY MOUTH  EVERY DAY 05/08/22  Yes Darylene Price A, FNP  metoprolol succinate (TOPROL-XL) 25 MG 24 hr tablet Take 0.5 tablets (12.5 mg total) by mouth every evening. 10/11/21  Yes Taniya Dasher, Otila Kluver A, FNP  omeprazole (PRILOSEC) 40 MG capsule Take 40 mg by mouth daily. 03/11/21  Yes [provider]  ondansetron (ZOFRAN) 4 MG tablet Take 4 mg by mouth every 8 (eight) hours as needed. 09/13/21  Yes [provider]  spironolactone (ALDACTONE) 25 MG tablet Take 1 tablet (25 mg total) by mouth daily. 05/22/22  Yes Larey Dresser, MD  tamsulosin (FLOMAX) 0.4 MG CAPS capsule Take 0.4 mg by mouth in the morning and at bedtime.   Yes [provider]  torsemide (DEMADEX) 20 MG tablet TAKE 2 TABLETS (40 MG TOTAL) BY MOUTH DAILY. AND ADDITIONAL '20MG'$  IF NEEDED 06/19/22  Yes Larey Dresser, MD  glucose blood (PRECISION QID TEST) test strip Use 3 (three) times daily Use as instructed. One touch ultra 07/05/21 07/05/22  [provider]  senna-docusate (SENOKOT-S) 8.6-50 MG tablet Take 1 tablet by mouth at bedtime. 06/11/22 07/11/22  Terrilee Croak, MD    Review of Systems  Constitutional:  Positive for fatigue. Negative for appetite change.  HENT:  Negative for congestion, postnasal drip and sore throat.   Eyes: Negative.   Respiratory:  Positive for chest tightness and shortness of breath (easily). Negative for cough.   Cardiovascular:  Negative for chest pain, palpitations and leg swelling.  Gastrointestinal:  Negative for abdominal distention, abdominal pain and constipation.  Endocrine: Negative.   Genitourinary: Negative.   Musculoskeletal:  Negative for back pain.  Skin: Negative.   Allergic/Immunologic: Negative.   Neurological:  Positive for weakness. Negative for dizziness and light-headedness.  Hematological:  Negative for adenopathy. Does not bruise/bleed easily.  Psychiatric/Behavioral:  Positive for sleep disturbance (sleeping on 2 pillows waking up frequently due urination).  Negative for dysphoric mood. The patient is not nervous/anxious.    Vitals:   07/05/22 0844  BP: 96/65  Pulse: 74  SpO2: 99%  Weight: 183 lb (83 kg)   Wt Readings from Last 3 Encounters:  07/05/22 183 lb (83 kg)  06/12/22 186 lb 6.4 oz (84.6 kg)  06/10/22 187 lb 2.7 oz (84.9 kg)   Lab Results  Component Value Date   CREATININE 1.81 (H) 06/29/2022   CREATININE 2.13 (H) 06/11/2022   CREATININE 2.36 (H) 06/10/2022   Physical Exam Vitals and nursing note reviewed. Exam conducted with a chaperone present (wife).  Constitutional:      Appearance: Normal appearance.  HENT:     Head: Normocephalic and atraumatic.  Cardiovascular:     Rate and Rhythm: Normal rate and regular rhythm.  Pulmonary:     Effort: Pulmonary effort is normal. No respiratory distress.     Breath  sounds: No wheezing or rales.  Abdominal:     General: There is no distension.     Tenderness: There is no abdominal tenderness.  Musculoskeletal:        General: No tenderness.     Cervical back: Normal range of motion and neck supple.     Right lower leg: No edema.     Left lower leg: No edema.  Skin:    General: Skin is warm and dry.  Neurological:     General: No focal deficit present.     Mental Status: He is alert and oriented to person, place, and time.  Psychiatric:        Mood and Affect: Mood normal.        Behavior: Behavior normal.        Thought Content: Thought content normal.   Assessment & Plan:  1: Chronic heart failure with reduced ejection fraction- - NYHA class III - euvolemic - weighing daily & home weight has been stable; reminded to call for an overnight weight gain of > 2 pounds or a weekly weight gain of >5 pounds - weight down 3 pounds from last visit here 3 weeks ago  - echo 06/29/22: EF of 25% with moderate Jermaine. Echo report from 09/16/21 reviewed and showed an EF of 30-35% along with mild Jermaine. Echo report from 05/16/21 reviewed and showed and EF of <20% along with mild/moderate LAE  and mild/moderate Jermaine.  - LHC 02/10/21: 2v CAD, including 100% midRCA, 70% midLAD with 80% focal distal LAD, 70% D1  elevated LVEDP (40mHg)  - Cardiac MRI 01/27/21: 1. The left ventricle is mildly dilated. Wall thickness is normal. The inferior wall is akinetic. The basal septum is severely hypokinetic. The mid-distal septum is moderately hypokinetic. The basal anterior wall is severely hypokinetic. The mid-distal anterior wall and apex are moderately hypokinetic. The lateral wall is moderately. hypokinetic. 2. The right ventricle is normal in cavity size and wall thickness. There is mild global hypokinesis with mildly reduced RV systolic function. The RVEF is calculated at 43%.  - not adding salt and had been reading food labels for sodium content  - losartan 12.'5mg'$  daily - metoprolol succinate 12.'5mg'$  daily - spironolactone '25mg'$  daily - torsemide '40mg'$  daily - allergic to empagliflozin with a yeast infection - BMP today - BP today will not tolerate changing losartan to entresto - saw ADHF provider (Marigene Ehlers 06/12/22 - saw EP (Quentin Ore 09/21/21; returns 07/12/22 for consideration of cardiac contractility modulator - had AICD implanted 06/22/21; no shocks have been delivered - BNP 05/29/22 was 729.9  2: HTN- - BP soft 96/65 - saw PCP ([name] 06/21/22; returns 09/12/22 - BMP 06/29/22 reviewed and showed sodium 134, potassium 4.9, creatinine 1.81 & GFR 39  3: DM with CKD- - A1c [id number]- trulicity 0.'75mg'$  weekly - saw nephrology (Holley Raring 03/09/22; returns 07/10/22  4: Atrial fibrillation- - [place] ([place] 06[place]; returns 09/26/22 - metoprolol succinate 12.'5mg'$  daily - apixaban '5mg'$  BID  -if he has recurrent AF, would favor ablation. 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   5: CAD- - CABG with a LIMA to LAD, SVG to ramus, and SVG to PDA on 02/16/21 - atorvastatin 80 mg daily.  - no ASA given stable CAD and apixaban use   Med list  reviewed  Return in 1 month, sooner if needed.

## 2022-07-10 ENCOUNTER — Other Ambulatory Visit: Payer: Self-pay | Admitting: Family

## 2022-07-11 NOTE — Progress Notes (Unsigned)
Electrophysiology Office Follow up Visit Note:    Date:  07/12/2022   ID:  Jermaine Kramer, DOB August 08, 1950, MRN [id number]  PCP:  Sherre Scarlet, PA-C  [facility] Cardiologist:  None  [facility] Electrophysiologist:  Vickie Epley, MD    Interval History:    Jermaine Kramer is a 72 y.o. male who presents for a follow up visit.    I last saw the patient Sep 21, 2021.  He has an ICD in situ is implanted June 22, 2021.  The patient last saw Dr. Aundra Dubin in the heart failure clinic June 12, 2022.  At that appointment he reported continued problems with heart failure symptoms.  He has a right bundleoid IVCD and has not felt to been a candidate for CRT upgrade.  He is referred back to discuss possible CCM therapy for his continued heart failure symptoms.  He is with his wife in clinic today.  He reports continued shortness of breath with minimal exertion.  He has a recent diagnosis of colitis and is waiting to get in with [facility].  He is interested in anything that could potentially help his breathing.  No bloody stools.              Past medical, surgical, social and family history were reviewed.  ROS:   Please see the history of present illness.    All other systems reviewed and are negative.  EKGs/Labs/Other Studies Reviewed:    The following studies were reviewed today:  June 29, 2022 echo shows EF 25%.  Moderate MR.  Mild AI.  June 12, 2022 EKG shows an atrial paced, ventricular sensed rhythm.  There is a nonspecific interventricular conduction delay with a QRS duration of 174 ms.  July 12, 2022 in clinic device interrogation personally reviewed Battery longevity okay.  Lead parameter stable.  No high-voltage therapies.     Physical Exam:    VS:  BP 94/62   Pulse 67   Ht '5\' 5"'$  (1.651 m)   Wt 183 lb (83 kg)   SpO2 97%   BMI 30.45 kg/m     Wt Readings from Last 3 Encounters:  07/12/22 183 lb (83 kg)  07/05/22 183 lb (83  kg)  06/12/22 186 lb 6.4 oz (84.6 kg)     GEN:  Well nourished, well developed in no acute distress CARDIAC: RRR, no murmurs, rubs, gallops.  Distended abdomen. RESPIRATORY:  Clear to auscultation without rales, wheezing or rhonchi       ASSESSMENT:    1. Chronic systolic heart failure ([place])   2. Primary hypertension    PLAN:    In order of problems listed above:  #Chronic systolic heart failure EF 20 to 25%.  Follows with Dr. Aundra Dubin in the heart failure clinic.  NYHA class III. EKG shows a nonspecific interventricular conduction delay with a right bundle branch block pattern in V1.  I do not think that he has a high chance of responding to CRT given the morphology of his EKG.  There is also not much dyssynchrony noted on his echocardiogram.  I did additional treatment options with the patient including cardiac contractility modulation and he is interested in proceeding.  Risks, benefits, alternatives to CCM therapy were discussed in detail with the patient today. The patient understands that the risks include but are not limited to bleeding, infection, pneumothorax, perforation, tamponade, lead dislodgment, vascular damage, renal failure, MI, stroke, death, and lead dislodgement and wishes to proceed.  We  will therefore schedule device implantation at the next available time.  He will hold his Eliquis for 3 days prior to the procedure and plan to restart it 5 days after the procedure.   #Hypertension At goal today.  Recommend checking blood pressures 1-2 times per week at home and recording the values.  Recommend bringing these recordings to the primary care physician.      Signed, Lars Mage, MD, [facility], [facility] 07/12/2022 9:41 AM    Electrophysiology [facility]

## 2022-07-12 ENCOUNTER — Ambulatory Visit: Payer: Medicare Other | Attending: Cardiology | Admitting: Cardiology

## 2022-07-12 ENCOUNTER — Encounter: Payer: Self-pay | Admitting: Cardiology

## 2022-07-12 VITALS — BP 94/62 | HR 67 | Ht 65.0 in | Wt 183.0 lb

## 2022-07-12 DIAGNOSIS — I5022 Chronic systolic (congestive) heart failure: Secondary | ICD-10-CM

## 2022-07-12 DIAGNOSIS — I10 Essential (primary) hypertension: Secondary | ICD-10-CM | POA: Insufficient documentation

## 2022-07-12 NOTE — Patient Instructions (Signed)
Medication Instructions:  Your physician recommends that you continue on your current medications as directed. Please refer to the Current Medication list given to you today.  *If you need a refill on your cardiac medications before your next appointment, please call your pharmacy*  Lab Work: BMET and CBC prior to your procedure You will get your lab work at [facility]) hospital.  Your lab work will be done at the [place] next to [employer]. These are walk in labs- you will not need an appointment and you do not need to be fasting.    Testing/Procedures: You will have a CCM (cardiac contractility modulation) implanted. Please see instruction letter given to you today.   Follow-Up: At [facility], you and your health needs are our priority.  As part of our continuing mission to provide you with exceptional heart care, we have created designated Provider Care Teams.  These Care Teams include your primary Cardiologist (physician) and Advanced Practice Providers (APPs -  Physician Assistants and Nurse Practitioners) who all work together to provide you with the care you need, when you need it.  Your next appointment:   We will call you to arrange follow up.

## 2022-07-18 NOTE — Progress Notes (Signed)
Remote ICD transmission.   

## 2022-07-21 ENCOUNTER — Emergency Department
Admission: EM | Admit: 2022-07-21 | Discharge: 2022-07-21 | Disposition: A | Discharge status: Home / Self Care | Payer: Medicare Other | Attending: Emergency Medicine | Admitting: Emergency Medicine

## 2022-07-21 ENCOUNTER — Emergency Department: Payer: Medicare Other

## 2022-07-21 ENCOUNTER — Other Ambulatory Visit: Payer: Self-pay

## 2022-07-21 DIAGNOSIS — I509 Heart failure, unspecified: Secondary | ICD-10-CM | POA: Insufficient documentation

## 2022-07-21 DIAGNOSIS — E119 Type 2 diabetes mellitus without complications: Secondary | ICD-10-CM | POA: Diagnosis not present

## 2022-07-21 DIAGNOSIS — Z8673 Personal history of transient ischemic attack (TIA), and cerebral infarction without residual deficits: Secondary | ICD-10-CM | POA: Insufficient documentation

## 2022-07-21 DIAGNOSIS — I2510 Atherosclerotic heart disease of native coronary artery without angina pectoris: Secondary | ICD-10-CM | POA: Diagnosis not present

## 2022-07-21 DIAGNOSIS — I110 Hypertensive heart disease with heart failure: Secondary | ICD-10-CM | POA: Insufficient documentation

## 2022-07-21 DIAGNOSIS — R1032 Left lower quadrant pain: Secondary | ICD-10-CM

## 2022-07-21 LAB — COMPREHENSIVE METABOLIC PANEL
ALT: 20 U/L (ref 0–44)
AST: 22 U/L (ref 15–41)
Albumin: 4 g/dL (ref 3.5–5.0)
Alkaline Phosphatase: 72 U/L (ref 38–126)
Anion gap: 8 (ref 5–15)
BUN: 36 mg/dL — ABNORMAL HIGH (ref 8–23)
CO2: 25 mmol/L (ref 22–32)
Calcium: 9.2 mg/dL (ref 8.9–10.3)
Chloride: 101 mmol/L (ref 98–111)
Creatinine, Ser: 1.91 mg/dL — ABNORMAL HIGH (ref 0.61–1.24)
GFR, Estimated: 37 mL/min — ABNORMAL LOW (ref >60)
Glucose, Bld: 270 mg/dL — ABNORMAL HIGH (ref 70–99)
Potassium: 4.8 mmol/L (ref 3.5–5.1)
Sodium: 134 mmol/L — ABNORMAL LOW (ref 135–145)
Total Bilirubin: 0.6 mg/dL (ref 0.3–1.2)
Total Protein: 7.4 g/dL (ref 6.5–8.1)

## 2022-07-21 LAB — URINALYSIS, ROUTINE W REFLEX MICROSCOPIC
Bacteria, UA: NONE SEEN
Bilirubin Urine: NEGATIVE
Glucose, UA: 500 mg/dL — AB
Hgb urine dipstick: NEGATIVE
Ketones, ur: NEGATIVE mg/dL
Leukocytes,Ua: NEGATIVE
Nitrite: NEGATIVE
Protein, ur: NEGATIVE mg/dL
Specific Gravity, Urine: 1.01 (ref 1.005–1.030)
Squamous Epithelial / HPF: NONE SEEN /HPF (ref 0–5)
pH: 5.5 (ref 5.0–8.0)

## 2022-07-21 LAB — CBC
HCT: 39.7 % (ref 39.0–52.0)
Hemoglobin: 13.6 g/dL (ref 13.0–17.0)
MCH: 31.5 pg (ref 26.0–34.0)
MCHC: 34.3 g/dL (ref 30.0–36.0)
MCV: 91.9 fL (ref 80.0–100.0)
Platelets: 166 10*3/uL (ref 150–400)
RBC: 4.32 MIL/uL (ref 4.22–5.81)
RDW: 13 % (ref 11.5–15.5)
WBC: 6.5 10*3/uL (ref 4.0–10.5)
nRBC: 0 % (ref 0.0–0.2)

## 2022-07-21 LAB — LIPASE, BLOOD: Lipase: 57 U/L — ABNORMAL HIGH (ref 11–51)

## 2022-07-21 LAB — BRAIN NATRIURETIC PEPTIDE: B Natriuretic Peptide: 837.9 pg/mL — ABNORMAL HIGH (ref 0.0–100.0)

## 2022-07-21 MED ORDER — IOHEXOL 300 MG/ML  SOLN
75.0000 mL | Freq: Once | INTRAMUSCULAR | Status: AC | PRN
  Administered 2022-07-21: 75 mL via INTRAVENOUS

## 2022-07-21 MED ORDER — FENTANYL CITRATE PF 50 MCG/ML IJ SOSY
50.0000 ug | PREFILLED_SYRINGE | Freq: Once | INTRAMUSCULAR | Status: AC
  Administered 2022-07-21: 50 ug via INTRAVENOUS
  Filled 2022-07-21: qty 1

## 2022-07-21 MED ORDER — TRAMADOL HCL 50 MG PO TABS: 50.0000 mg | ORAL_TABLET | Freq: Four times a day (QID) | ORAL | 0 refills | Status: DC | PRN

## 2022-07-21 NOTE — ED Triage Notes (Signed)
Pt to ED via POV from home. Pt reports right sided flank pain that has been ongoing for a month and has gotten worse. Pt reports hx colitis. Pt denies urinary symptoms.

## 2022-07-21 NOTE — ED Provider Notes (Signed)
[facility] Provider Note    Event Date/Time   First MD Initiated Contact with Patient 07/21/22 [phone]     (approximate)   History   Flank Pain   HPI  Jermaine Kramer is a 72 y.o. male with a history of type 2 diabetes, hypertension, hyperlipidemia, CAD, atrial fibrillation, CHF, sleep apnea, CVA, GERD, and BPH who presents with left lower quadrant/left flank pain for the last several weeks, worsening the last several days.  The patient was seen here for similar pain last month and states that he was given antibiotics.  After leaving the hospital he states the pain never completely went away but was much better until a few days ago.  He denies any associated nausea or vomiting, diarrhea or constipation, blood in the stool, fever or chills, urinary symptoms, or any other acute symptoms.  He reports chronic shortness of breath but denies any acute worsening.  I reviewed the past medical records.  The patient was most recent admitted last month after presenting with left flank pain.  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   Physical Exam   Triage Vital Signs: ED Triage Vitals  Enc Vitals Group     BP 07/21/22 0801 106/66     Pulse Rate 07/21/22 0801 71     Resp 07/21/22 0801 18     Temp 07/21/22 0801 98.2 F (36.8 C)     Temp src --      SpO2 07/21/22 0801 97 %     Weight --      Height --      Head Circumference --      Peak Flow --      Pain Score 07/21/22 0802 8     Pain Loc --      Pain Edu? --      Excl. in [place]? --     Most recent vital signs: Vitals:   07/21/22 0900 07/21/22 1000  BP: 108/69 (!) 93/57  Pulse: 69 69  Resp: 15 16  Temp:    SpO2: 98% 96%     General: Awake, no distress.  CV:  Good peripheral perfusion.  Resp:  Normal effort.  Abd:  Soft with moderate left lower quadrant/left flank tenderness.  No distention.  Other:  No jaundice or scleral icterus.   ED Results / Procedures / Treatments   Labs (all labs  ordered are listed, but only abnormal results are displayed) Labs Reviewed  LIPASE, BLOOD - Abnormal; Notable for the following components:      Result Value   Lipase 57 (*)    All other components within normal limits  COMPREHENSIVE METABOLIC PANEL - Abnormal; Notable for the following components:   Sodium 134 (*)    Glucose, Bld 270 (*)    BUN 36 (*)    Creatinine, Ser 1.91 (*)    GFR, Estimated 37 (*)    All other components within normal limits  URINALYSIS, ROUTINE W REFLEX MICROSCOPIC - Abnormal; Notable for the following components:   APPearance CLEAR (*)    Glucose, UA 500 (*)    All other components within normal limits  BRAIN NATRIURETIC PEPTIDE - Abnormal; Notable for the following components:   B Natriuretic Peptide 837.9 (*)    All other components within normal limits  CBC     EKG  ED ECG REPORT I, Arta Silence, the attending physician, personally viewed and interpreted this ECG.  Date: 07/21/2022 EKG Time: 08 20 Rate: 70  Rhythm: normal sinus rhythm QRS Axis: normal Intervals: RBBB, LAFB ST/T Wave abnormalities: normal Narrative Interpretation: no evidence of acute ischemia    RADIOLOGY  CT abdomen/pelvis: I independently viewed and interpreted the images; there are no dilated bowel loops or any free air or free fluid.  IMPRESSION:  No bowel obstruction, free air or free fluid. Moderate stool. Normal  appendix. Few colonic diverticula.    Enlarged prostate with mass effect along the base of the bladder.  Please correlate with patient's PSA.    Bilateral renal atrophy with small stones and benign stable cysts.    Enlarged heart with defibrillator.    Small hiatal hernia    Stable central mesenteric haziness.     PROCEDURES:  Critical Care performed: No  Procedures   MEDICATIONS ORDERED IN ED: Medications  fentaNYL (SUBLIMAZE) injection 50 mcg (50 mcg Intravenous Given 07/21/22 0839)  iohexol (OMNIPAQUE) 300 MG/ML solution 75 mL  (75 mLs Intravenous Contrast Given 07/21/22 0926)     IMPRESSION / MDM / ASSESSMENT AND PLAN / ED COURSE  I reviewed the triage vital signs and the nursing notes.  72 year old male with PMH as noted above presents with left lower quadrant/left flank pain which is acutely worsened in the last few days but with no significant associated symptoms.  On exam blood pressure is borderline low although the patient reports that this is chronic.  He has tenderness in the left lower quadrant/left flank.  Differential diagnosis includes, but is not limited to, recurrent panniculitis, colitis, diverticulitis, ureteral stone, less likely pyelonephritis.  We will obtain lab workup including urinalysis, BNP to rule out acute CHF, as well as CT abdomen/pelvis for further evaluation.  Patient's presentation is most consistent with acute complicated illness / injury requiring diagnostic workup.  The patient is on the cardiac monitor to evaluate for evidence of arrhythmia and/or significant heart rate changes.  ----------------------------------------- 10:51 AM on 07/21/2022 -----------------------------------------  CT today shows stable mesenteric haziness similar to the patient's presentation from a month ago, however there are no other concerning acute findings.  The patient is not having any urinary symptoms so there is no indication for further workup of his BPH in the ED.  Overall I suspect recurrence of the panniculitis that the patient had last time.  There is no evidence of mesenteric ischemia or of any colitis or diverticulitis.  Lab workup is also reassuring with stable creatinine and lipase, no leukocytosis, normal LFTs, and electrolytes.  BNP is not significantly elevated.  UA is negative.  At this time, the patient is comfortable.  He is stable for discharge home.  Blood pressure remained stable, most recently 105/75 when I was reassessing the patient.  The patient confirms that this is his  baseline.  I counseled the patient and his family member on the results of the workup.  I gave strict return precautions and he expressed understanding.   FINAL CLINICAL IMPRESSION(S) / ED DIAGNOSES   Final diagnoses:  Left lower quadrant abdominal pain     Rx / DC Orders   ED Discharge Orders          Ordered    traMADol (ULTRAM) 50 MG tablet  Every 6 hours PRN        07/21/22 1049             Note:  This document was prepared using Dragon voice recognition software and may include unintentional dictation errors.    Arta Silence, MD 07/21/22 1052

## 2022-07-21 NOTE — Discharge Instructions (Signed)
Your CT scan is showing that there is still some inflammation in the mesentery which is the connective tissue around your intestines.  However there is no sign of any obstruction or infection in the intestines themselves.  Take the tramadol as needed for pain.  You should eat a high-fiber diet to make sure that you are using the bathroom regularly.  Follow-up with primary care and GI as planned.  Return to the ER for new, worsening, or persistent severe abdominal or flank pain, fever or chills, vomiting, diarrhea, blood in the stool, or any other new or worsening symptoms that concern you.

## 2022-07-28 ENCOUNTER — Emergency Department
Admission: EM | Admit: 2022-07-28 | Discharge: 2022-07-29 | Disposition: A | Discharge status: Home / Self Care | Payer: Medicare Other | Attending: Emergency Medicine | Admitting: Emergency Medicine

## 2022-07-28 DIAGNOSIS — I110 Hypertensive heart disease with heart failure: Secondary | ICD-10-CM | POA: Diagnosis not present

## 2022-07-28 DIAGNOSIS — Z951 Presence of aortocoronary bypass graft: Secondary | ICD-10-CM | POA: Insufficient documentation

## 2022-07-28 DIAGNOSIS — Z79899 Other long term (current) drug therapy: Secondary | ICD-10-CM | POA: Diagnosis not present

## 2022-07-28 DIAGNOSIS — R739 Hyperglycemia, unspecified: Secondary | ICD-10-CM

## 2022-07-28 DIAGNOSIS — E1165 Type 2 diabetes mellitus with hyperglycemia: Secondary | ICD-10-CM | POA: Insufficient documentation

## 2022-07-28 DIAGNOSIS — Z7984 Long term (current) use of oral hypoglycemic drugs: Secondary | ICD-10-CM | POA: Diagnosis not present

## 2022-07-28 DIAGNOSIS — Z7901 Long term (current) use of anticoagulants: Secondary | ICD-10-CM | POA: Insufficient documentation

## 2022-07-28 DIAGNOSIS — Z8673 Personal history of transient ischemic attack (TIA), and cerebral infarction without residual deficits: Secondary | ICD-10-CM | POA: Insufficient documentation

## 2022-07-28 DIAGNOSIS — I2510 Atherosclerotic heart disease of native coronary artery without angina pectoris: Secondary | ICD-10-CM | POA: Diagnosis not present

## 2022-07-28 DIAGNOSIS — I5042 Chronic combined systolic (congestive) and diastolic (congestive) heart failure: Secondary | ICD-10-CM | POA: Insufficient documentation

## 2022-07-28 LAB — CBC WITH DIFFERENTIAL/PLATELET
Abs Immature Granulocytes: 0.03 10*3/uL (ref 0.00–0.07)
Basophils Absolute: 0 10*3/uL (ref 0.0–0.1)
Basophils Relative: 0 %
Eosinophils Absolute: 0 10*3/uL (ref 0.0–0.5)
Eosinophils Relative: 0 %
HCT: 39.1 % (ref 39.0–52.0)
Hemoglobin: 13.6 g/dL (ref 13.0–17.0)
Immature Granulocytes: 0 %
Lymphocytes Relative: 6 %
Lymphs Abs: 0.5 10*3/uL — ABNORMAL LOW (ref 0.7–4.0)
MCH: 31.3 pg (ref 26.0–34.0)
MCHC: 34.8 g/dL (ref 30.0–36.0)
MCV: 89.9 fL (ref 80.0–100.0)
Monocytes Absolute: 0.4 10*3/uL (ref 0.1–1.0)
Monocytes Relative: 5 %
Neutro Abs: 6.7 10*3/uL (ref 1.7–7.7)
Neutrophils Relative %: 89 %
Platelets: 181 10*3/uL (ref 150–400)
RBC: 4.35 MIL/uL (ref 4.22–5.81)
RDW: 12.7 % (ref 11.5–15.5)
WBC: 7.6 10*3/uL (ref 4.0–10.5)
nRBC: 0 % (ref 0.0–0.2)

## 2022-07-28 LAB — BASIC METABOLIC PANEL
Anion gap: 15 (ref 5–15)
BUN: 48 mg/dL — ABNORMAL HIGH (ref 8–23)
CO2: 23 mmol/L (ref 22–32)
Calcium: 9.5 mg/dL (ref 8.9–10.3)
Chloride: 91 mmol/L — ABNORMAL LOW (ref 98–111)
Creatinine, Ser: 2.14 mg/dL — ABNORMAL HIGH (ref 0.61–1.24)
GFR, Estimated: 32 mL/min — ABNORMAL LOW (ref >60)
Glucose, Bld: 426 mg/dL — ABNORMAL HIGH (ref 70–99)
Potassium: 4.7 mmol/L (ref 3.5–5.1)
Sodium: 129 mmol/L — ABNORMAL LOW (ref 135–145)

## 2022-07-28 LAB — URINALYSIS, ROUTINE W REFLEX MICROSCOPIC
Bilirubin Urine: NEGATIVE
Glucose, UA: >=500 mg/dL — AB
Hgb urine dipstick: NEGATIVE
Ketones, ur: NEGATIVE mg/dL
Leukocytes,Ua: NEGATIVE
Nitrite: NEGATIVE
Protein, ur: NEGATIVE mg/dL
Specific Gravity, Urine: 1.01 (ref 1.005–1.030)
pH: 6 (ref 5.0–8.0)

## 2022-07-28 LAB — CBG MONITORING, ED: Glucose-Capillary: 410 mg/dL — ABNORMAL HIGH (ref 70–99)

## 2022-07-28 MED ORDER — SODIUM CHLORIDE 0.9 % IV BOLUS
500.0000 mL | Freq: Once | INTRAVENOUS | Status: AC
  Administered 2022-07-29: 500 mL via INTRAVENOUS

## 2022-07-28 MED ORDER — INSULIN ASPART 100 UNIT/ML IJ SOLN
10.0000 [IU] | Freq: Once | INTRAMUSCULAR | Status: AC
  Administered 2022-07-29: 10 [IU] via SUBCUTANEOUS
  Filled 2022-07-28: qty 1

## 2022-07-28 NOTE — ED Provider Notes (Signed)
[facility] Provider Note    Event Date/Time   First MD Initiated Contact with Patient 07/28/22 2316     (approximate)   History   Hyperglycemia   HPI {Remember to add pertinent medical, surgical, social, and/or OB history to HPI:1} Jermaine Kramer is a 72 y.o. male  ***       Past Medical History   Past Medical History:  Diagnosis Date   Arrhythmia    atrial fibrillation   CHF (congestive heart failure) ([place])    Coronary artery disease    Diabetes mellitus without complication ([place])    GERD (gastroesophageal reflux disease)    Hypercholesteremia    Hypertension    Sleep apnea    Stroke [facility])      Active Problem List   Patient Active Problem List   Diagnosis Date Noted   UTI (urinary tract infection) 06/09/2022   Abdominal pain 06/09/2022   Elevated lactic acid level 06/09/2022   Gastroenteritis and colitis, viral 10/05/2021   Acute gastroenteritis, rotavirus 10/04/2021   AKI (acute kidney injury) with metabolic acidosis(HCC) 123456   Diabetes mellitus without complication ([employer])    Hypokalemia    Hypotension    Dental caries    Acute pulmonary edema ([place])    Left flank pain 07/24/2021   Stroke ([place]) 07/24/2021   Chronic combined systolic and diastolic congestive heart failure ([place]) 07/24/2021   Atrial fibrillation, chronic ([place]) 07/24/2021   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   Kidney stone on left side 07/24/2021   HFrEF (heart failure with reduced ejection fraction) ([place]) 06/22/2021   Ischemic cardiomyopathy 06/22/2021   CAD s/p CABG x 3 06/22/2021   Hyperlipidemia LDL goal <70 06/22/2021   Acute CHF (congestive heart failure) ([place]) 05/14/2021   Incomplete emptying of bladder 05/04/2021   Acute respiratory failure with hypoxia (HCC)    Troponin I above reference range    COVID 04/21/2021   Postoperative atrial fibrillation ([place]) 04/21/2021   DM (diabetes mellitus), type 2 with coma (HCC)    GERD (gastroesophageal  reflux disease)    Hypertension    Acute parotitis 02/27/2021   Volume overload 02/19/2021   Thrombocytopenia ([place]) 02/18/2021   Receiving inotropic medication 02/18/2021   Postoperative pain 02/18/2021   On enteral nutrition 02/18/2021   Postoperative anemia due to acute blood loss 02/18/2021   Altered mental status 02/18/2021   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/10/2020   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   Bradycardia 12/10/2020   Epididymo-orchitis 11/30/2020   Urinary retention 04/09/2020   Obesity (BMI 30.0-34.9) 05/14/2017   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   Renal cyst, acquired, left 12/22/2013   Neoplasm of uncertain behavior of penis 12/22/2013   Malignant neoplasm ([place]) 12/22/2013   History of urinary stone 12/22/2013   BPH (benign prostatic hyperplasia) 12/22/2013   Benign localized hyperplasia of prostate with urinary obstruction and other lower urinary tract symptoms (LUTS)(600.21) 12/22/2013     Past Surgical History   Past Surgical History:  Procedure Laterality Date   CATARACT EXTRACTION Bilateral 04/06/2022   CHOLECYSTECTOMY     COLONOSCOPY WITH PROPOFOL N/A 04/14/2019   Procedure: COLONOSCOPY WITH PROPOFOL;  Surgeon: Toledo, Benay Pike, MD;  Location: [facility] ENDOSCOPY;  Service: Gastroenterology;  Laterality: N/A;   CORONARY ARTERY BYPASS GRAFT  02/16/2021   ICD IMPLANT N/A 06/22/2021   Procedure: ICD IMPLANT;  Surgeon: Vickie Epley, MD;  Location: [place] [facility] LAB;  Service: Cardiovascular;  Laterality: N/A;   KNEE ARTHROSCOPY  RENAL CYST EXCISION       Home Medications   Prior to Admission medications   Medication Sig Start Date End Date Taking? Authorizing Provider  apixaban (ELIQUIS) 5 MG TABS tablet Take 1 tablet (5 mg total) by mouth 2 (two) times daily. 11/28/21   Alisa Graff, FNP  atorvastatin (LIPITOR) 80 MG tablet Take 1 tablet (80 mg total) by mouth daily. 01/10/22   Alisa Graff, FNP  calcitRIOL (ROCALTROL) 0.25 MCG  capsule Take 0.25 mcg by mouth daily. 03/07/22 03/07/23  [provider]  Dulaglutide 0.75 MG/0.5ML SOPN Inject 0.75 mg into the skin once a week. 11/10/21 05/25/23  [provider]  losartan (COZAAR) 25 MG tablet TAKE 1/2 TABLET BY MOUTH EVERY DAY 05/08/22   Darylene Price A, FNP  metoprolol succinate (TOPROL-XL) 25 MG 24 hr tablet TAKE 0.5 TABLETS BY MOUTH EVERY EVENING. 07/10/22   Alisa Graff, FNP  omeprazole (PRILOSEC) 40 MG capsule Take 40 mg by mouth daily. 03/11/21   [provider]  ondansetron (ZOFRAN) 4 MG tablet Take 4 mg by mouth every 8 (eight) hours as needed. 09/13/21   [provider]  spironolactone (ALDACTONE) 25 MG tablet Take 1 tablet (25 mg total) by mouth daily. 05/22/22   Larey Dresser, MD  tamsulosin (FLOMAX) 0.4 MG CAPS capsule Take 0.4 mg by mouth in the morning and at bedtime.    [provider]  torsemide (DEMADEX) 20 MG tablet TAKE 2 TABLETS (40 MG TOTAL) BY MOUTH DAILY. AND ADDITIONAL 20MG  IF NEEDED 06/19/22   Larey Dresser, MD  traMADol (ULTRAM) 50 MG tablet Take 1 tablet (50 mg total) by mouth every 6 (six) hours as needed. 07/21/22 07/21/23  Arta Silence, MD     Allergies  Doxycycline, Heparin, and Empagliflozin   Family History   Family History  Problem Relation Age of Onset   Parkinson's disease Mother    Lupus Mother    Heart disease Father      Physical Exam  Triage Vital Signs: ED Triage Vitals  Enc Vitals Group     BP 07/28/22 2139 101/70     Pulse Rate 07/28/22 2139 76     Resp 07/28/22 2139 18     Temp 07/28/22 2139 98.3 F (36.8 C)     Temp Source 07/28/22 2139 Oral     SpO2 07/28/22 2139 95 %     Weight 07/28/22 2137 180 lb 11.2 oz (82 kg)     Height 07/28/22 2137 5\' 5"  (1.651 m)     Head Circumference --      Peak Flow --      Pain Score --      Pain Loc --      Pain Edu? --      Excl. in [place]? --     Updated Vital Signs: BP 101/70   Pulse 76   Temp 98.3 F (36.8 C) (Oral)    Resp 18   Ht 5\' 5"  (1.651 m)   Wt 82 kg   SpO2 95%   BMI 30.07 kg/m   {Only need to document appropriate and relevant physical exam:1} General: Awake, no distress. *** CV:  Good peripheral perfusion. *** Resp:  Normal effort. *** Abd:  No distention. *** Other:  ***   ED Results / Procedures / Treatments  Labs (all labs ordered are listed, but only abnormal results are displayed) Labs Reviewed  CBC WITH DIFFERENTIAL/PLATELET - Abnormal; Notable for the following components:  Result Value   Lymphs Abs 0.5 (*)    All other components within normal limits  BASIC METABOLIC PANEL - Abnormal; Notable for the following components:   Sodium 129 (*)    Chloride 91 (*)    Glucose, Bld 426 (*)    BUN 48 (*)    Creatinine, Ser 2.14 (*)    GFR, Estimated 32 (*)    All other components within normal limits  CBG MONITORING, ED - Abnormal; Notable for the following components:   Glucose-Capillary 410 (*)    All other components within normal limits  URINALYSIS, ROUTINE W REFLEX MICROSCOPIC  TROPONIN I (HIGH SENSITIVITY)     EKG  ***   RADIOLOGY *** {You MUST document your own interpretation of imaging, as well as the fact that you reviewed the radiologist's report!:1}  Official radiology report(s): No results found.   PROCEDURES:  Critical Care performed: {CriticalCareYesNo:19197::"Yes, see critical care procedure note(s)","No"}  Procedures   MEDICATIONS ORDERED IN ED: Medications - No data to display   IMPRESSION / MDM / [place] / ED COURSE  I reviewed the triage vital signs and the nursing notes.                              Differential diagnosis includes, but is not limited to, ***  Patient's presentation is most consistent with {EM COPA:27473}  {If the patient is on the monitor, remove the brackets and asterisks on the sentence below and remember to document it as a Procedure as well. Otherwise delete the sentence below:1} {**The patient  is on the cardiac monitor to evaluate for evidence of arrhythmia and/or significant heart rate changes.**}  {Remember to include, when applicable, any/all of the following data: independent review of imaging independent review of labs (comment specifically on pertinent positives and negatives) review of specific prior hospitalizations, PCP/specialist notes, etc. discuss meds given and prescribed document any discussion with consultants (including hospitalists) any clinical decision tools you used and why (PECARN, NEXUS, etc.) did you consider admitting the patient? document social determinants of health affecting patient's care (homelessness, inability to follow up in a timely fashion, etc) document any pre-existing conditions increasing risk on current visit (e.g. diabetes and HTN increasing danger of high-risk chest pain/ACS) describes what meds you gave (especially parenteral) and why any other interventions?:1}      FINAL CLINICAL IMPRESSION(S) / ED DIAGNOSES   Final diagnoses:  Hyperglycemia     Rx / DC Orders   ED Discharge Orders     None        Note:  This document was prepared using Dragon voice recognition software and may include unintentional dictation errors.

## 2022-07-28 NOTE — ED Triage Notes (Signed)
Patient C/O hyperglycemia that began on Tuesday. Patient was put on prednisone on Tuesday by GI Dr, and patient's provider ordered him insulin since his sugars were 200+ and climbing (normally only uses metformin). Blood sugar before arrival was 525. Patient states he used 10 units of lantus this morning, none tonight. Endorses feeling tired, but denies any fevers or illness at this time.

## 2022-07-29 DIAGNOSIS — E1165 Type 2 diabetes mellitus with hyperglycemia: Secondary | ICD-10-CM | POA: Diagnosis not present

## 2022-07-29 LAB — CBG MONITORING, ED: Glucose-Capillary: 218 mg/dL — ABNORMAL HIGH (ref 70–99)

## 2022-07-29 LAB — TROPONIN I (HIGH SENSITIVITY): Troponin I (High Sensitivity): 14 ng/L (ref <18)

## 2022-07-29 NOTE — Discharge Instructions (Signed)
Continue Lantus as prescribed by your doctor.  Return to the ER for worsening symptoms, persistent vomiting, difficulty breathing or other concerns.

## 2022-07-29 NOTE — ED Notes (Signed)
Called lab and added on troponin

## 2022-08-13 NOTE — Progress Notes (Unsigned)
Patient ID: Jermaine Kramer, male    DOB: 07/31/1950, 72 y.o.   MRN: [id number]  Primary cardiologist: Leanora Ivanoff, PA (last seen 02/24; returns 05/24) PCP: Gardiner Coins, PA-C (last seen 04/24) EP: Steffanie Dunn, MD (last seen 03/24) [facility] provider: Marca Ancona, MD (last seen 02/24)  HPI  Jermaine Kramer is a 72 y/o male with a history of leukemia, DM, mesenteric panniculitis, CAD (CABG 02/16/21), AF, sleep apnea, hyperlipidemia, HTN, stroke, GERD & chronic heart failure.   Echo 06/29/22: EF of 25% with moderate Jermaine. Echo 09/16/21: EF of 30-35% along with mild Jermaine. Echo 05/16/21: EF of <20% along with mild/moderate LAE and mild/moderate Jermaine.   LHC 02/10/21:  2v CAD, including 100% midRCA, 70% midLAD with 80% focal distal LAD, 70% D1  Elevated LVEDP ( )   Cardiac MRI 01/27/21:  1. The left ventricle is mildly dilated.  Wall thickness is normal.     a.  The inferior wall is akinetic.     b.  The basal septum is severely hypokinetic.  The mid-distal septum is moderately hypokinetic.     c.  The basal anterior wall is severely hypokinetic. The mid-distal anterior wall and apex are  moderately hypokinetic.     d.  The lateral wall is moderately. hypokinetic.   Global LV systolic function is severely reduced with an LVEF calculated at 30%.    2. The right ventricle is normal in cavity size and wall thickness.  There is mild global hypokinesis with  mildly reduced RV systolic function.  The RVEF is calculated at 43%.   Was in the ED 07/28/22 due to hyperglycemia. Was in the ED 07/21/22 due to LUQ pain. Admitted 06/09/22 due to LLQ abdominal pain. CT showed mesenteric pancolitis. Was in the ED 04/07/22 due to cough/ SOB.   He presents today for a HF follow-up visit with a chief complaint of moderate SOB with minimal exertion. Chronic in nature. Has fatigue, left-sided abdominal pain, light-headedness, weakness and difficulty sleeping along with this. Denies abdominal distention, palpitations,  pedal edema, chest pain, cough or weight gain.   Has been diagnosed with mesenteric panniculitis and is trying to take prednisone for this but this is resulting in hyperglycemia with glucose levels of 200-400.   Scheduled for CCM generator with A/V lead insertion on 09/04/22. He is hoping this will help improve his SOB as he voices frustration that he can't "do anything" due to his SOB and now his abdominal pain.   Past Medical History:  Diagnosis Date   Arrhythmia    atrial fibrillation   CHF (congestive heart failure) (HCC)    Coronary artery disease    Diabetes mellitus without complication (HCC)    GERD (gastroesophageal reflux disease)    Hypercholesteremia    Hypertension    Sleep apnea    Stroke [facility])    Past Surgical History:  Procedure Laterality Date   CATARACT EXTRACTION Bilateral 04/06/2022   CHOLECYSTECTOMY     COLONOSCOPY WITH PROPOFOL N/A 04/14/2019   Procedure: COLONOSCOPY WITH PROPOFOL;  Surgeon: Toledo, Boykin Nearing, MD;  Location: [facility] ENDOSCOPY;  Service: Gastroenterology;  Laterality: N/A;   CORONARY ARTERY BYPASS GRAFT  02/16/2021   ICD IMPLANT N/A 06/22/2021   Procedure: ICD IMPLANT;  Surgeon: Lanier Prude, MD;  Location: [facility] INVASIVE CV LAB;  Service: Cardiovascular;  Laterality: N/A;   KNEE ARTHROSCOPY     RENAL CYST EXCISION      Family History  Problem Relation Age of Onset  Parkinson's disease Mother    Lupus Mother    Heart disease Father    Social History   Tobacco Use   Smoking status: Never   Smokeless tobacco: Never  Substance Use Topics   Alcohol use: Not Currently   Allergies  Allergen Reactions   Doxycycline Anaphylaxis    Patient does not recall having this reaction.     Heparin Other (See Comments)    heparin-induced thrombocytopenia    Empagliflozin Itching    Groin infection Caused a groin infection.     Prior to Admission medications   Medication Sig Start Date End Date Taking? Authorizing Provider  apixaban  (ELIQUIS) 5 MG TABS tablet Take 1 tablet (5 mg total) by mouth 2 (two) times daily. 11/28/21  Yes Belma Dyches, Inetta Fermo A, FNP  atorvastatin (LIPITOR) 80 MG tablet Take 1 tablet (80 mg total) by mouth daily. 01/10/22  Yes Peace Jost, Inetta Fermo A, FNP  calcitRIOL (ROCALTROL) 0.25 MCG capsule Take 0.25 mcg by mouth daily. 03/07/22 03/07/23 Yes [provider]  LANTUS 100 UNIT/ML injection Inject 12 Units into the skin at bedtime.   Yes [provider]  losartan (COZAAR) 25 MG tablet TAKE 1/2 TABLET BY MOUTH EVERY DAY 05/08/22  Yes Clarisa Kindred A, FNP  metFORMIN (GLUCOPHAGE) 500 MG tablet Take 500 mg by mouth 2 (two) times daily.   Yes [provider]  metoprolol succinate (TOPROL-XL) 25 MG 24 hr tablet TAKE 0.5 TABLETS BY MOUTH EVERY EVENING. Patient taking differently: Take 25 mg by mouth daily. 07/10/22  Yes Cherie Lasalle, Inetta Fermo A, FNP  omeprazole (PRILOSEC) 40 MG capsule Take 40 mg by mouth daily. 03/11/21  Yes [provider]  ondansetron (ZOFRAN) 4 MG tablet Take 4 mg by mouth every 8 (eight) hours as needed. 09/13/21  Yes [provider]  Semaglutide,0.25 or 0.5MG /DOS, (OZEMPIC, 0.25 OR 0.5 MG/DOSE,) 2 MG/1.5ML SOPN Inject 0.25 mg into the skin once a week.   Yes [provider]  spironolactone (ALDACTONE) 25 MG tablet Take 1 tablet (25 mg total) by mouth daily. 05/22/22  Yes Laurey Morale, MD  tamsulosin (FLOMAX) 0.4 MG CAPS capsule Take 0.4 mg by mouth in the morning and at bedtime.   Yes [provider]  torsemide (DEMADEX) 20 MG tablet TAKE 2 TABLETS (40 MG TOTAL) BY MOUTH DAILY. AND ADDITIONAL 20MG  IF NEEDED 06/19/22  Yes Laurey Morale, MD   Review of Systems  Constitutional:  Positive for fatigue. Negative for appetite change.  HENT:  Negative for congestion, postnasal drip and sore throat.   Eyes: Negative.   Respiratory:  Positive for shortness of breath (easily). Negative for cough and chest tightness.   Cardiovascular:  Negative for chest pain,  palpitations and leg swelling.  Gastrointestinal:  Positive for abdominal pain (left sided pain). Negative for abdominal distention and constipation.  Endocrine: Negative.   Genitourinary: Negative.   Musculoskeletal:  Negative for back pain.  Skin: Negative.   Allergic/Immunologic: Negative.   Neurological:  Positive for weakness and light-headedness. Negative for dizziness.  Hematological:  Negative for adenopathy. Does not bruise/bleed easily.  Psychiatric/Behavioral:  Positive for sleep disturbance (sleeping on 2 pillows waking up frequently due urination). Negative for dysphoric mood. The patient is not nervous/anxious.    Vitals:   08/14/22 0917  BP: 95/66  Pulse: 70  Resp: 16  SpO2: 100%  Weight: 180 lb 8 oz (81.9 kg)   Wt Readings from Last 3 Encounters:  08/14/22 180 lb 8 oz (81.9 kg)  07/28/22 180  lb 11.2 oz (82 kg)  07/12/22 183 lb (83 kg)   Lab Results  Component Value Date   CREATININE 1.95 (H) 08/14/2022   CREATININE 2.14 (H) 07/28/2022   CREATININE 1.91 (H) 07/21/2022   Physical Exam Vitals and nursing note reviewed. Exam conducted with a chaperone present (wife).  Constitutional:      Appearance: Normal appearance.  HENT:     Head: Normocephalic and atraumatic.  Cardiovascular:     Rate and Rhythm: Normal rate and regular rhythm.  Pulmonary:     Effort: Pulmonary effort is normal. No respiratory distress.     Breath sounds: No wheezing or rales.  Abdominal:     General: There is no distension.     Tenderness: There is abdominal tenderness (left lateral abdomen).  Musculoskeletal:        General: No tenderness.     Cervical back: Normal range of motion and neck supple.     Right lower leg: No edema.     Left lower leg: No edema.  Skin:    General: Skin is warm and dry.  Neurological:     General: No focal deficit present.     Mental Status: He is alert and oriented to person, place, and time.  Psychiatric:        Mood and Affect: Mood normal.         Behavior: Behavior normal.        Thought Content: Thought content normal.   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 Echo report from 09/16/21 reviewed and showed an EF of 30-35% along with mild Jermaine. Echo report from 05/16/21 reviewed and showed and EF of <20% along with mild/moderate LAE and mild/moderate Jermaine.  - LHC 02/10/21: 2v CAD, including 100% midRCA, 70% midLAD with 80% focal distal LAD, 70% D1  elevated LVEDP ( )  - Cardiac MRI 01/27/21: 1. The left ventricle is mildly dilated. Wall thickness is normal. The inferior wall is akinetic. The basal septum is severely hypokinetic. The mid-distal septum is moderately hypokinetic. The basal anterior wall is severely hypokinetic. The mid-distal anterior wall and apex are moderately hypokinetic. The lateral wall is moderately. hypokinetic. 2. The right ventricle is normal in cavity size and wall thickness. There is mild global hypokinesis with mildly reduced RV systolic function. The RVEF is calculated at 43%.  - not adding salt and had been reading food labels for sodium content  - continue losartan 12.5mg  daily - continue metoprolol succinate 25mg  daily - decrease spironolactone to 12.5mg  daily due to hypotension/ light-headedness - continue torsemide 40mg  daily - allergic to empagliflozin with a yeast infection - BP today will not tolerate changing losartan to entresto - saw AD[facility] provider Jearld Pies) 06/12/22 - saw EP Lalla Brothers) 03/24 for consideration of cardiac contractility modulator; this has been scheduled for next month - had AICD implanted 06/22/21; no shocks have been delivered - BNP 07/21/22 was 837.9  2: HTN- - BP  95/66; decreasing spironolactone per above - saw PCP Greggory Stallion) 04/24 - BMP 07/28/22 reviewed and showed sodium 129, potassium 4.7, creatinine 2.14 & GFR 32 - BMP today  3: DM with CKD- -  A1c 06/10/22 was 8.4% - ozempic 0.25mg  weekly; less GI upset with this compared to trulicity - saw nephrology Cherylann Ratel) 03/24 - glucose this morning was 201; has had high readings due to prednisone  4: Atrial fibrillation- - saw cardiology Mellissa Kohut) 02/24; returns 05/24 - continue metoprolol succinate  daily - continue apixaban  BID  -if he has recurrent AF, would favor ablation. 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   5: CAD- - CABG with a LIMA to LAD, SVG to ramus, and SVG to PDA on 02/16/21 - continue atorvastatin 80 mg daily.  - no ASA given stable CAD and apixaban use  6: Mesenteric panniculitis- - saw GI Norma Fredrickson) 03/24 - trying to treat this with prednisone but difficulty tolerating this due to hyperglycemia  Return in 2 months, sooner if needed.

## 2022-08-14 ENCOUNTER — Other Ambulatory Visit
Admission: RE | Admit: 2022-08-14 | Discharge: 2022-08-14 | Disposition: A | Discharge status: Home / Self Care | Payer: Medicare Other | Source: Ambulatory Visit | Attending: Family | Admitting: Family

## 2022-08-14 ENCOUNTER — Ambulatory Visit (HOSPITAL_BASED_OUTPATIENT_CLINIC_OR_DEPARTMENT_OTHER): Payer: Medicare Other | Admitting: Family

## 2022-08-14 ENCOUNTER — Encounter: Payer: Self-pay | Admitting: Family

## 2022-08-14 VITALS — BP 95/66 | HR 70 | Resp 16 | Wt 180.5 lb

## 2022-08-14 DIAGNOSIS — E1122 Type 2 diabetes mellitus with diabetic chronic kidney disease: Secondary | ICD-10-CM | POA: Diagnosis not present

## 2022-08-14 DIAGNOSIS — K654 Sclerosing mesenteritis: Secondary | ICD-10-CM

## 2022-08-14 DIAGNOSIS — I5022 Chronic systolic (congestive) heart failure: Secondary | ICD-10-CM

## 2022-08-14 DIAGNOSIS — I10 Essential (primary) hypertension: Secondary | ICD-10-CM | POA: Diagnosis not present

## 2022-08-14 DIAGNOSIS — I480 Paroxysmal atrial fibrillation: Secondary | ICD-10-CM | POA: Diagnosis not present

## 2022-08-14 DIAGNOSIS — N1832 Chronic kidney disease, stage 3b: Secondary | ICD-10-CM

## 2022-08-14 DIAGNOSIS — I2510 Atherosclerotic heart disease of native coronary artery without angina pectoris: Secondary | ICD-10-CM

## 2022-08-14 LAB — BASIC METABOLIC PANEL
Anion gap: 9 (ref 5–15)
BUN: 33 mg/dL — ABNORMAL HIGH (ref 8–23)
CO2: 24 mmol/L (ref 22–32)
Calcium: 8.9 mg/dL (ref 8.9–10.3)
Chloride: 102 mmol/L (ref 98–111)
Creatinine, Ser: 1.95 mg/dL — ABNORMAL HIGH (ref 0.61–1.24)
GFR, Estimated: 36 mL/min — ABNORMAL LOW (ref >60)
Glucose, Bld: 173 mg/dL — ABNORMAL HIGH (ref 70–99)
Potassium: 4.7 mmol/L (ref 3.5–5.1)
Sodium: 135 mmol/L (ref 135–145)

## 2022-08-14 NOTE — Patient Instructions (Signed)
Decrease spironolactone to 1/2 tablet daily.

## 2022-08-18 ENCOUNTER — Other Ambulatory Visit: Payer: Self-pay | Admitting: Cardiology

## 2022-08-23 ENCOUNTER — Telehealth: Payer: Self-pay | Admitting: *Deleted

## 2022-08-23 NOTE — Telephone Encounter (Signed)
Pts wife called stating pts bp has been running low today before meds his bp was 87/53. Pt denies any lightheadedness, dizziness, or fatigue and states he feels fine. Pts wife asked for advice before giving morning medications.   Routed to [employer] for advice

## 2022-08-24 NOTE — Telephone Encounter (Signed)
Left vm for return call

## 2022-09-01 NOTE — Pre-Procedure Instructions (Signed)
Instructed patient on the following items: Arrival time 1300 Nothing to eat or drink after midnight No meds AM of procedure Responsible person to drive you home and stay with you for 24 hrs Wash with special soap night before and morning of procedure If on anti-coagulant drug instructions Eliquis- last dose 5/2

## 2022-09-04 ENCOUNTER — Other Ambulatory Visit: Payer: Self-pay

## 2022-09-04 ENCOUNTER — Encounter (HOSPITAL_COMMUNITY)
Admission: RE | Disposition: A | Discharge status: Home / Self Care | Payer: Self-pay | Source: Ambulatory Visit | Attending: Cardiology

## 2022-09-04 ENCOUNTER — Ambulatory Visit (HOSPITAL_COMMUNITY): Payer: Medicare Other

## 2022-09-04 ENCOUNTER — Ambulatory Visit (HOSPITAL_COMMUNITY)
Admission: RE | Admit: 2022-09-04 | Discharge: 2022-09-04 | Disposition: A | Discharge status: Home / Self Care | Payer: Medicare Other | Source: Ambulatory Visit | Attending: Cardiology | Admitting: Cardiology

## 2022-09-04 DIAGNOSIS — Z95810 Presence of automatic (implantable) cardiac defibrillator: Secondary | ICD-10-CM | POA: Insufficient documentation

## 2022-09-04 DIAGNOSIS — I110 Hypertensive heart disease with heart failure: Secondary | ICD-10-CM | POA: Insufficient documentation

## 2022-09-04 DIAGNOSIS — I5022 Chronic systolic (congestive) heart failure: Secondary | ICD-10-CM | POA: Diagnosis not present

## 2022-09-04 HISTORY — PX: CCM GENERATOR AND A/V LEAD INSERTION: EP1261

## 2022-09-04 LAB — CBC
HCT: 36.6 % — ABNORMAL LOW (ref 39.0–52.0)
Hemoglobin: 12.4 g/dL — ABNORMAL LOW (ref 13.0–17.0)
MCH: 32 pg (ref 26.0–34.0)
MCHC: 33.9 g/dL (ref 30.0–36.0)
MCV: 94.6 fL (ref 80.0–100.0)
Platelets: 158 10*3/uL (ref 150–400)
RBC: 3.87 MIL/uL — ABNORMAL LOW (ref 4.22–5.81)
RDW: 13.1 % (ref 11.5–15.5)
WBC: 5.1 10*3/uL (ref 4.0–10.5)
nRBC: 0 % (ref 0.0–0.2)

## 2022-09-04 LAB — GLUCOSE, CAPILLARY
Glucose-Capillary: 128 mg/dL — ABNORMAL HIGH (ref 70–99)
Glucose-Capillary: 109 mg/dL — ABNORMAL HIGH (ref 70–99)

## 2022-09-04 SURGERY — CCM GENERATOR AND A/V LEAD INSERTION

## 2022-09-04 MED ORDER — LIDOCAINE HCL (PF) 1 % IJ SOLN
INTRAMUSCULAR | Status: AC
  Filled 2022-09-04: qty 60

## 2022-09-04 MED ORDER — SODIUM CHLORIDE 0.9 % IV SOLN: INTRAVENOUS | Status: DC

## 2022-09-04 MED ORDER — MIDAZOLAM HCL 5 MG/5ML IJ SOLN
INTRAMUSCULAR | Status: DC | PRN
  Administered 2022-09-04 (×2): 1 mg via INTRAVENOUS

## 2022-09-04 MED ORDER — CEFAZOLIN SODIUM-DEXTROSE 2-4 GM/100ML-% IV SOLN
INTRAVENOUS | Status: AC
  Administered 2022-09-04: 2 g via INTRAVENOUS
  Filled 2022-09-04: qty 100

## 2022-09-04 MED ORDER — LIDOCAINE HCL (PF) 1 % IJ SOLN
INTRAMUSCULAR | Status: DC | PRN
  Administered 2022-09-04: 50 mL

## 2022-09-04 MED ORDER — SODIUM CHLORIDE 0.9 % IV SOLN
INTRAVENOUS | Status: AC
  Administered 2022-09-04: 80 mg
  Filled 2022-09-04: qty 2

## 2022-09-04 MED ORDER — ONDANSETRON HCL 4 MG/2ML IJ SOLN: 4.0000 mg | Freq: Four times a day (QID) | INTRAMUSCULAR | Status: DC | PRN

## 2022-09-04 MED ORDER — MIDAZOLAM HCL 5 MG/5ML IJ SOLN
INTRAMUSCULAR | Status: AC
  Filled 2022-09-04: qty 5

## 2022-09-04 MED ORDER — FENTANYL CITRATE (PF) 100 MCG/2ML IJ SOLN
INTRAMUSCULAR | Status: DC | PRN
  Administered 2022-09-04 (×2): 25 ug via INTRAVENOUS

## 2022-09-04 MED ORDER — CEFAZOLIN SODIUM-DEXTROSE 2-4 GM/100ML-% IV SOLN: 2.0000 g | INTRAVENOUS | Status: AC

## 2022-09-04 MED ORDER — SODIUM CHLORIDE 0.9 % IV SOLN
INTRAVENOUS | Status: DC | PRN
  Administered 2022-09-04: 250 mL

## 2022-09-04 MED ORDER — ACETAMINOPHEN 325 MG PO TABS: 325.0000 mg | ORAL_TABLET | ORAL | Status: DC | PRN

## 2022-09-04 MED ORDER — SODIUM CHLORIDE 0.9 % IV SOLN: 80.0000 mg | INTRAVENOUS | Status: AC

## 2022-09-04 MED ORDER — POVIDONE-IODINE 10 % EX SWAB
2.0000 | Freq: Once | CUTANEOUS | Status: AC
  Administered 2022-09-04: 2 via TOPICAL

## 2022-09-04 MED ORDER — FENTANYL CITRATE (PF) 100 MCG/2ML IJ SOLN
INTRAMUSCULAR | Status: AC
  Filled 2022-09-04: qty 2

## 2022-09-04 SURGICAL SUPPLY — 9 items
CABLE SURGICAL S-101-97-12 (CABLE) ×2 IMPLANT
GEN PULSE OPTIMIZER SMART MINI (Generator) ×1 IMPLANT
GENERATOR PLS OPTMZR SMRT MINI (Generator) IMPLANT
LEAD CAPSURE NOVUS 5076-58CM (Lead) IMPLANT
PAD DEFIB RADIO PHYSIO CONN (PAD) ×2 IMPLANT
POUCH AIGIS-R ANTIBACT PPM (Mesh General) ×1 IMPLANT
POUCH AIGIS-R ANTIBACT PPM MED (Mesh General) IMPLANT
SHEATH 7FR PRELUDE SNAP 13 (SHEATH) IMPLANT
TRAY PACEMAKER INSERTION (PACKS) ×2 IMPLANT

## 2022-09-04 NOTE — Discharge Instructions (Signed)
After Your Cardiac Device   You have a [employer] Cardiac Device  ACTIVITY Do not lift your arm above shoulder height for 1 week after your procedure. After 7 days, you may progress as below.  You should remove your sling 24 hours after your procedure, unless otherwise instructed by your provider.     Monday Sep 11, 2022  Tuesday Sep 12, 2022 Wednesday Sep 13, 2022 Thursday Sep 14, 2022   Do not lift, push, pull, or carry anything over 10 pounds with the affected arm until 6 weeks (Monday October 16, 2022 ) after your procedure.   You may drive AFTER your wound check, unless you have been told otherwise by your provider.   Ask your healthcare provider when you can go back to work   INCISION/Dressing If you are on a blood thinner such as Coumadin, Xarelto, Eliquis, Plavix, or Pradaxa please confirm with your provider when this should be resumed. Restart on 09/10/2022  If large square, outer bandage is left in place, this can be removed after 24 hours from your procedure. Do not remove steri-strips or glue as below.   If a PRESSURE DRESSING (a bulky dressing that usually goes up over your shoulder) was applied or left in place, please follow instructions given by your provider on when to return to have this removed.   Monitor your cardiac device site for redness, swelling, and drainage. Call the device clinic at [phone] if you experience these symptoms or fever/chills.  If your incision is sealed with Steri-strips or staples, you may shower 7 days after your procedure or when told by your provider. Do not remove the steri-strips or let the shower hit directly on your site. You may wash around your site with soap and water.    If you were discharged in a sling, please do not wear this during the day more than 48 hours after your surgery unless otherwise instructed. This may increase the risk of stiffness and soreness in your shoulder.   Avoid lotions, ointments, or perfumes over  your incision until it is well-healed.  You may use a hot tub or a pool AFTER your wound check appointment if the incision is completely closed.   DEVICE MANAGEMENT You should receive your ID card for your new device in 4-8 weeks. Keep this card with you at all times once received. Consider wearing a medical alert bracelet or necklace.  Please follow manufacture instructions for keeping your device charged carefully. This should be performed every 2 weeks at the very least.   Your cardiac device may be MRI compatible. This will be discussed at your next office visit/wound check.  You should avoid contact with strong electric or magnetic fields.   Do not use amateur (ham) radio equipment or electric (arc) welding torches. MP3 player headphones with magnets should not be used. Some devices are safe to use if held at least 12 inches (30 cm) from your cardiac device. These include power tools, lawn mowers, and speakers. If you are unsure if something is safe to use, ask your health care provider.  When using your cell phone, hold it to the ear that is on the opposite side from the cardiac device. Do not leave your cell phone in a pocket over the cardiac device.  You may safely use electric blankets, heating pads, computers, and microwave ovens.  Call the office right away if: You have chest pain. You feel more short of breath than you have felt before. You  feel more light-headed than you have felt before. Your incision starts to open up.  This information is not intended to replace advice given to you by your health care provider. Make sure you discuss any questions you have with your health care provider.

## 2022-09-04 NOTE — H&P (Signed)
Electrophysiology Office Follow up Visit Note:     Date:  09/04/2022    ID:  Jermaine Kramer, DOB 12-03-1950, MRN [id number]   PCP:  Gardiner Coins, PA-C      [facility] Cardiologist:  None  [facility] Electrophysiologist:  Lanier Prude, MD      Interval History:     Jermaine Kramer is a 72 y.o. male who presents for a follow up visit.      I last saw the patient Sep 21, 2021.  He has an ICD in situ is implanted June 22, 2021.   The patient last saw Dr. Shirlee Latch in the heart failure clinic June 12, 2022.  At that appointment he reported continued problems with heart failure symptoms.  He has a right bundleoid IVCD and has not felt to been a candidate for CRT upgrade.   He is referred back to discuss possible CCM therapy for his continued heart failure symptoms.   He is with his wife in clinic today.  He reports continued shortness of breath with minimal exertion.  He has a recent diagnosis of colitis and is waiting to get in with [facility].  He is interested in anything that could potentially help his breathing.  No bloody stools.   Presents for CCM therapy implant. Procedure reviewed.     Objective  Past medical, surgical, social and family history were reviewed.   ROS:   Please see the history of present illness.    All other systems reviewed and are negative.   EKGs/Labs/Other Studies Reviewed:     The following studies were reviewed today:   June 29, 2022 echo shows EF 25%.  Moderate MR.  Mild AI.   June 12, 2022 EKG shows an atrial paced, ventricular sensed rhythm.  There is a nonspecific interventricular conduction delay with a QRS duration of 174 ms.   July 12, 2022 in clinic device interrogation personally reviewed Battery longevity okay.  Lead parameter stable.  No high-voltage therapies.         Physical Exam:     VS:  BP 118/78   Pulse 69   Ht 5\' 5"  (1.651 m)   Wt 183 lb (83 kg)   SpO2 97%   BMI 30.45 kg/m         Wt  Readings from Last 3 Encounters:  07/12/22 183 lb (83 kg)  07/05/22 183 lb (83 kg)  06/12/22 186 lb 6.4 oz (84.6 kg)      GEN:  Well nourished, well developed in no acute distress CARDIAC: RRR, no murmurs, rubs, gallops.  Distended abdomen. RESPIRATORY:  Clear to auscultation without rales, wheezing or rhonchi          Assessment ASSESSMENT:     1. Chronic systolic heart failure (HCC)   2. Primary hypertension     PLAN:     In order of problems listed above:   #Chronic systolic heart failure EF 20 to 25%.  Follows with Dr. Shirlee Latch in the heart failure clinic.  NYHA class III. EKG shows a nonspecific interventricular conduction delay with a right bundle branch block pattern in V1.  I do not think that he has a high chance of responding to CRT given the morphology of his EKG.  There is also not much dyssynchrony noted on his echocardiogram.   I did additional treatment options with the patient including cardiac contractility modulation and he is interested in proceeding.   Risks, benefits, alternatives to CCM therapy  were discussed in detail with the patient today. The patient understands that the risks include but are not limited to bleeding, infection, pneumothorax, perforation, tamponade, lead dislodgment, vascular damage, renal failure, MI, stroke, death, and lead dislodgement and wishes to proceed.  We will therefore schedule device implantation at the next available time.   He will hold his Eliquis for 3 days prior to the procedure and plan to restart it 5 days after the procedure.    Presents for CCM implant. Procedure reviewed.       Signed, Steffanie Dunn, MD, [facility], [facility] 09/04/2022 Electrophysiology [facility]

## 2022-09-05 ENCOUNTER — Encounter (HOSPITAL_COMMUNITY): Payer: Self-pay | Admitting: Cardiology

## 2022-09-13 ENCOUNTER — Ambulatory Visit: Payer: Medicare Other | Attending: Cardiology

## 2022-09-13 DIAGNOSIS — I5022 Chronic systolic (congestive) heart failure: Secondary | ICD-10-CM

## 2022-09-13 NOTE — Progress Notes (Signed)
Wound check appointment. Steri-strips removed. Wound without redness or edema. Incision edges approximated, wound well healed. Patient educated about wound care, arm mobility, lifting restrictions. ROV in 3 months with implanting physician.  CCM implant-evaluated by industry.  See scanned document to media.

## 2022-09-13 NOTE — Patient Instructions (Signed)
After Your Pacemaker  Monitor your pacemaker site for redness, swelling, and drainage. Call the device clinic at [phone] if you experience these symptoms or fever/chills.  Your incision was closed with Steri-strips or staples:  You may shower 7 days after your procedure and wash your incision with soap and water. Avoid lotions, ointments, or perfumes over your incision until it is well-healed.  You may use a hot tub or a pool after your wound check appointment if the incision is completely closed.  Do not lift, push or pull greater than 10 pounds with the affected arm until 6 weeks after your procedure. There are no other restrictions in arm movement after your wound check appointment.  You may drive, unless driving has been restricted by your healthcare providers.  

## 2022-09-14 ENCOUNTER — Ambulatory Visit: Payer: Medicare Other

## 2022-09-20 ENCOUNTER — Ambulatory Visit (INDEPENDENT_AMBULATORY_CARE_PROVIDER_SITE_OTHER): Payer: Medicare Other

## 2022-09-20 DIAGNOSIS — I5022 Chronic systolic (congestive) heart failure: Secondary | ICD-10-CM

## 2022-09-21 LAB — CUP PACEART REMOTE DEVICE CHECK
Battery Remaining Longevity: 105 mo
Battery Voltage: 3 V
Brady Statistic AP VP Percent: 0.07 %
Brady Statistic AP VS Percent: 5.73 %
Brady Statistic AS VP Percent: 0.03 %
Brady Statistic AS VS Percent: 94.17 %
Brady Statistic RA Percent Paced: 5.71 %
Brady Statistic RV Percent Paced: 0.1 %
Date Time Interrogation Session: 20240523003426
HighPow Impedance: 59 Ohm
Implantable Lead Connection Status: 753985
Implantable Lead Connection Status: 753985
Implantable Lead Implant Date: 20230222
Implantable Lead Implant Date: 20230222
Implantable Lead Location: 753859
Implantable Lead Location: 753860
Implantable Lead Model: 5076
Implantable Pulse Generator Implant Date: 20230222
Lead Channel Impedance Value: 456 Ohm
Lead Channel Impedance Value: 513 Ohm
Lead Channel Impedance Value: 589 Ohm
Lead Channel Pacing Threshold Amplitude: 0.625 V
Lead Channel Pacing Threshold Amplitude: 1.125 V
Lead Channel Pacing Threshold Pulse Width: 0.4 ms
Lead Channel Pacing Threshold Pulse Width: 0.4 ms
Lead Channel Sensing Intrinsic Amplitude: 1.375 mV
Lead Channel Sensing Intrinsic Amplitude: 1.375 mV
Lead Channel Sensing Intrinsic Amplitude: 11.25 mV
Lead Channel Sensing Intrinsic Amplitude: 11.25 mV
Lead Channel Setting Pacing Amplitude: 1.5 V
Lead Channel Setting Pacing Amplitude: 1.75 V
Lead Channel Setting Pacing Pulse Width: 0.4 ms
Lead Channel Setting Sensing Sensitivity: 0.3 mV
Zone Setting Status: 755011
Zone Setting Status: 755011

## 2022-09-27 ENCOUNTER — Encounter: Payer: Medicare Other | Admitting: Cardiology

## 2022-10-12 NOTE — Progress Notes (Signed)
Remote ICD transmission.   

## 2022-10-22 NOTE — Progress Notes (Unsigned)
PCP: Gardiner Coins, PA-C (last seen 06/24) Primary Cardiologist: Marcina Millard, MD (last seen 05/24) EP: Steffanie Dunn, MD (last seen 03/24) ADHF provider: Marca Ancona, MD (last seen 02/24)  HPI:  Jermaine Kramer is a 72 y/o male with a history of leukemia, DM, mesenteric panniculitis, CAD (CABG 02/16/21), AF, sleep apnea, hyperlipidemia, HTN, stroke, GERD & chronic heart failure.   Echo 06/29/22: EF of 25% with moderate Jermaine. Echo 09/16/21: EF of 30-35% along with mild Jermaine. Echo 05/16/21: EF of <20% along with mild/moderate LAE and mild/moderate Jermaine.   LHC 02/10/21:  2v CAD, including 100% midRCA, 70% midLAD with 80% focal distal LAD, 70% D1  Elevated LVEDP ( )   Cardiac MRI 01/27/21:  1. The left ventricle is mildly dilated.  Wall thickness is normal.     a.  The inferior wall is akinetic.     b.  The basal septum is severely hypokinetic.  The mid-distal septum is moderately hypokinetic.     c.  The basal anterior wall is severely hypokinetic. The mid-distal anterior wall and apex are  moderately hypokinetic.     d.  The lateral wall is moderately. hypokinetic.   Global LV systolic function is severely reduced with an LVEF calculated at 30%.    2. The right ventricle is normal in cavity size and wall thickness.  There is mild global hypokinesis with  mildly reduced RV systolic function.  The RVEF is calculated at 43%.   Was in the ED 07/28/22 due to hyperglycemia. Was in the ED 07/21/22 due to LUQ pain. Admitted 06/09/22 due to LLQ abdominal pain. CT showed mesenteric pancolitis. Was in the ED 04/07/22 due to cough/ SOB.   He presents today for a HF follow-up visit with a chief complaint of minimal fatigue with moderate exertion. Chronic in nature although much improved since CCM generator was placed. Has associated shortness of breath and intermittent dizziness along with this. Denies chest pain, palpitations, pedal edema, abdominal distention, weight gain or difficulty sleeping.    No longer taking prednisone for mesenteric panniculitis and glucose is becoming much better controlled. Glucose this mornning was 140  Had CCM generator with A/V lead insertion on 09/04/22. Wife notices that since this was placed, he has more energy and they've been able to do more around the house and go more places than previously.   At last visit here, spironolactone was decreased to 12.5mg  daily due to hypotension. Wife says that he hasn't had to take any additional diuretic since last here.   ROS: All systems negative except as listed in HPI, PMH and Problem List.  SH:  Social History   Socioeconomic History   Marital status: Married    Spouse name: Not on file   Number of children: Not on file   Years of education: Not on file   Highest education level: Not on file  Occupational History   Not on file  Tobacco Use   Smoking status: Never   Smokeless tobacco: Never  Vaping Use   Vaping Use: Never used  Substance and Sexual Activity   Alcohol use: Not Currently   Drug use: No   Sexual activity: Not Currently  Other Topics Concern   Not on file  Social History Narrative   Not on file   Social Determinants of Health   Financial Resource Strain: Not on file  Food Insecurity: No Food Insecurity (06/10/2022)   Hunger Vital Sign    Worried About Running Out of Food in the  Last Year: Never true    Ran Out of Food in the Last Year: Never true  Transportation Needs: No Transportation Needs (06/10/2022)   PRAPARE - Administrator, Civil Service (Medical): No    Lack of Transportation (Non-Medical): No  Physical Activity: Not on file  Stress: Not on file  Social Connections: Not on file  Intimate Partner Violence: Not At Risk (06/10/2022)   Humiliation, Afraid, Rape, and Kick questionnaire    Fear of Current or Ex-Partner: No    Emotionally Abused: No    Physically Abused: No    Sexually Abused: No    FH:  Family History  Problem Relation Age of Onset    Parkinson's disease Mother    Lupus Mother    Heart disease Father     Past Medical History:  Diagnosis Date   Arrhythmia    atrial fibrillation   CHF (congestive heart failure) (HCC)    Coronary artery disease    Diabetes mellitus without complication (HCC)    GERD (gastroesophageal reflux disease)    Hypercholesteremia    Hypertension    Sleep apnea    Stroke [facility])     Current Outpatient Medications  Medication Sig Dispense Refill   apixaban (ELIQUIS) 5 MG TABS tablet Take 1 tablet (5 mg total) by mouth 2 (two) times daily. 180 tablet 3   atorvastatin (LIPITOR) 80 MG tablet Take 1 tablet (80 mg total) by mouth daily. 30 tablet 5   calcitRIOL (ROCALTROL) 0.25 MCG capsule Take 0.25 mcg by mouth daily.     LANTUS 100 UNIT/ML injection Inject 12 Units into the skin at bedtime.     losartan (COZAAR) 25 MG tablet TAKE 1/2 TABLET BY MOUTH EVERY DAY 45 tablet 2   metFORMIN (GLUCOPHAGE) 500 MG tablet Take 500 mg by mouth 2 (two) times daily.     metoprolol succinate (TOPROL-XL) 25 MG 24 hr tablet TAKE 0.5 TABLETS BY MOUTH EVERY EVENING. (Patient taking differently: Take 12.5 mg by mouth daily.) 45 tablet 3   omeprazole (PRILOSEC) 40 MG capsule Take 40 mg by mouth 2 (two) times daily.     ondansetron (ZOFRAN) 4 MG tablet Take 4 mg by mouth every 8 (eight) hours as needed.     Semaglutide,0.25 or 0.5MG /DOS, (OZEMPIC, 0.25 OR 0.5 MG/DOSE,) 2 MG/1.5ML SOPN Inject into the skin once a week.     spironolactone (ALDACTONE) 25 MG tablet TAKE 1 TABLET (25 MG TOTAL) BY MOUTH DAILY. (Patient taking differently: Take 12.5 mg by mouth daily.) 90 tablet 3   tamsulosin (FLOMAX) 0.4 MG CAPS capsule Take 0.4 mg by mouth daily.     torsemide (DEMADEX) 20 MG tablet TAKE 2 TABLETS (40 MG TOTAL) BY MOUTH DAILY. AND ADDITIONAL 20MG  IF NEEDED 180 tablet 1   No current facility-administered medications for this visit.   Vitals:   10/23/22 0902  BP: (!) 105/56  Pulse: 74  SpO2: 99%  Weight: 180 lb (81.6  kg)   Wt Readings from Last 3 Encounters:  10/23/22 180 lb (81.6 kg)  09/04/22 177 lb (80.3 kg)  08/14/22 180 lb 8 oz (81.9 kg)   Lab Results  Component Value Date   CREATININE 1.95 (H) 08/14/2022   CREATININE 2.14 (H) 07/28/2022   CREATININE 1.91 (H) 07/21/2022   PHYSICAL EXAM:  General:  Well appearing. No resp difficulty HEENT: normal Neck: supple. JVP flat. No lymphadenopathy or thryomegaly appreciated. Cor: PMI normal. Regular rate & rhythm. No rubs, gallops or murmurs. Lungs: clear  Abdomen: firm, nontender, nondistended. No hepatosplenomegaly. No bruits or masses.  Extremities: no cyanosis, clubbing, rash, edema Neuro: alert & oriented x3, cranial nerves grossly intact. Moves all 4 extremities w/o difficulty. Affect pleasant.   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 Echo report from 09/16/21 reviewed and showed an EF of 30-35% along with mild Jermaine. Echo report from 05/16/21 reviewed and showed and EF of <20% along with mild/moderate LAE and mild/moderate Jermaine.  - LHC 02/10/21: 2v CAD, including 100% midRCA, 70% midLAD with 80% focal distal LAD, 70% D1  elevated LVEDP ( )  - Cardiac MRI 01/27/21: 1. The left ventricle is mildly dilated. Wall thickness is normal. The inferior wall is akinetic. The basal septum is severely hypokinetic. The mid-distal septum is moderately hypokinetic. The basal anterior wall is severely hypokinetic. The mid-distal anterior wall and apex are moderately hypokinetic. The lateral wall is moderately. hypokinetic. 2. The right ventricle is normal in cavity size  and wall thickness. There is mild global hypokinesis with mildly reduced RV systolic function. The RVEF is calculated at 43%.  - ReDs today was 32% - not adding salt and had been reading food labels for sodium content  - continue losartan 12.5mg  daily - continue metoprolol succinate 12.5mg  daily - continue spironolactone to 12.5mg  daily  - continue torsemide 40mg  daily w/ additional 20mg  PRN - current BP will not allow for med titration - allergic to empagliflozin with a yeast infection - saw ADHF provider Jearld Pies) 02/24 - saw EP Lalla Brothers) 03/24 - cardiac contractility modulator placed 05/24 & feels like his energy has improved - had AICD implanted 06/22/21; no shocks have been delivered - BNP 07/21/22 was 837.9  2: HTN- - BP 105/56 - saw PCP Greggory Stallion) 04/24 - BMP 08/14/22 reviewed and showed sodium 135, potassium 4.7, creatinine 1.95 & GFR 36  3: DM with CKD- - A1c 06/10/22 was 8.4% - ozempic 1mg  weekly; less GI upset with this compared to trulicity - saw nephrology Cherylann Ratel) 03/24 - glucose this morning was 140; improving since no longer taking prednisone  4: Atrial fibrillation- - saw cardiology Mellissa Kohut) 05/24 - continue metoprolol succinate 12.5mg  daily - continue apixaban 5mg  BID - has applied to [employer] to get apixaban for a reduced rate even when he hits the donut hole; currently waiting on a response from them  -if he has recurrent AF, would favor ablation. 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   5: CAD- - CABG with a LIMA to LAD, SVG to ramus, and SVG to PDA on 02/16/21 - continue atorvastatin 80 mg daily.  - no ASA given stable CAD and apixaban use  6: Mesenteric panniculitis- - saw GI Norma Fredrickson) 03/24 - no longer taking prednisone; denies current abdominal pain  Return in 3 months, sooner if needed.

## 2022-10-23 ENCOUNTER — Ambulatory Visit: Payer: Medicare Other | Attending: Family | Admitting: Family

## 2022-10-23 ENCOUNTER — Encounter: Payer: Self-pay | Admitting: Family

## 2022-10-23 VITALS — BP 105/56 | HR 74 | Wt 180.0 lb

## 2022-10-23 DIAGNOSIS — Z7984 Long term (current) use of oral hypoglycemic drugs: Secondary | ICD-10-CM | POA: Insufficient documentation

## 2022-10-23 DIAGNOSIS — I2510 Atherosclerotic heart disease of native coronary artery without angina pectoris: Secondary | ICD-10-CM | POA: Diagnosis not present

## 2022-10-23 DIAGNOSIS — I10 Essential (primary) hypertension: Secondary | ICD-10-CM

## 2022-10-23 DIAGNOSIS — N189 Chronic kidney disease, unspecified: Secondary | ICD-10-CM | POA: Diagnosis not present

## 2022-10-23 DIAGNOSIS — I130 Hypertensive heart and chronic kidney disease with heart failure and stage 1 through stage 4 chronic kidney disease, or unspecified chronic kidney disease: Secondary | ICD-10-CM | POA: Insufficient documentation

## 2022-10-23 DIAGNOSIS — I4891 Unspecified atrial fibrillation: Secondary | ICD-10-CM | POA: Insufficient documentation

## 2022-10-23 DIAGNOSIS — I480 Paroxysmal atrial fibrillation: Secondary | ICD-10-CM

## 2022-10-23 DIAGNOSIS — E1122 Type 2 diabetes mellitus with diabetic chronic kidney disease: Secondary | ICD-10-CM | POA: Diagnosis not present

## 2022-10-23 DIAGNOSIS — Z8249 Family history of ischemic heart disease and other diseases of the circulatory system: Secondary | ICD-10-CM | POA: Diagnosis not present

## 2022-10-23 DIAGNOSIS — I5022 Chronic systolic (congestive) heart failure: Secondary | ICD-10-CM | POA: Diagnosis present

## 2022-10-23 DIAGNOSIS — K654 Sclerosing mesenteritis: Secondary | ICD-10-CM | POA: Diagnosis not present

## 2022-10-23 DIAGNOSIS — Z794 Long term (current) use of insulin: Secondary | ICD-10-CM | POA: Diagnosis not present

## 2022-10-23 DIAGNOSIS — Z951 Presence of aortocoronary bypass graft: Secondary | ICD-10-CM | POA: Insufficient documentation

## 2022-10-23 DIAGNOSIS — Z7901 Long term (current) use of anticoagulants: Secondary | ICD-10-CM | POA: Diagnosis not present

## 2022-10-23 DIAGNOSIS — Z79899 Other long term (current) drug therapy: Secondary | ICD-10-CM | POA: Diagnosis not present

## 2022-10-23 DIAGNOSIS — Z95810 Presence of automatic (implantable) cardiac defibrillator: Secondary | ICD-10-CM | POA: Insufficient documentation

## 2022-10-23 DIAGNOSIS — N1832 Chronic kidney disease, stage 3b: Secondary | ICD-10-CM

## 2022-12-13 ENCOUNTER — Encounter: Payer: Medicare Other | Admitting: Cardiology

## 2022-12-18 NOTE — Progress Notes (Deleted)
Electrophysiology Office Follow up Visit Note:    Date:  12/18/2022   ID:  [name], DOB 1950-10-23, MRN [id number]  PCP:  Gardiner Coins, PA-C  [facility] Cardiologist:  None  [facility] Electrophysiologist:  Lanier Prude, MD    Interval History:    Jermaine Kramer is a 72 y.o. male who presents for a follow up visit.   The patient had a CCM implant Sep 04, 2022. He has an ICD that was implanted June 22, 2021.      Past medical, surgical, social and family history were reviewed.  ROS:   Please see the history of present illness.    All other systems reviewed and are negative.  EKGs/Labs/Other Studies Reviewed:    The following studies were reviewed today:  December 20, 2022 in clinic device interrogation personally reviewed ***       Physical Exam:    VS:  There were no vitals taken for this visit.    Wt Readings from Last 3 Encounters:  10/23/22 180 lb (81.6 kg)  09/04/22 177 lb (80.3 kg)  08/14/22 180 lb 8 oz (81.9 kg)     GEN: *** Well nourished, well developed in no acute distress CARDIAC: ***RRR, no murmurs, rubs, gallops RESPIRATORY:  Clear to auscultation without rales, wheezing or rhonchi       ASSESSMENT:    No diagnosis found. PLAN:    In order of problems listed above:   #Chronic systolic heart failure #ICD in situ #CCM in situ NYHA class II-III.  Warm and dry on exam. Continue torsemide, spironolactone, metoprolol, losartan  #Atrial fibrillation On Eliquis for stroke prophylaxis   Follow-up 1 year with APP.      Signed, Steffanie Dunn, MD, [facility], [facility] 12/18/2022 11:27 AM    Electrophysiology [facility]

## 2022-12-20 ENCOUNTER — Ambulatory Visit (INDEPENDENT_AMBULATORY_CARE_PROVIDER_SITE_OTHER): Payer: Medicare Other

## 2022-12-20 ENCOUNTER — Encounter: Payer: Medicare Other | Admitting: Cardiology

## 2022-12-20 DIAGNOSIS — I255 Ischemic cardiomyopathy: Secondary | ICD-10-CM | POA: Diagnosis not present

## 2022-12-20 LAB — CUP PACEART REMOTE DEVICE CHECK
Battery Remaining Longevity: 103 mo
Battery Voltage: 3.01 V
Brady Statistic AP VP Percent: 0.05 %
Brady Statistic AP VS Percent: 12.92 %
Brady Statistic AS VP Percent: 0.03 %
Brady Statistic AS VS Percent: 87 %
Brady Statistic RA Percent Paced: 12.73 %
Brady Statistic RV Percent Paced: 0.08 %
Date Time Interrogation Session: 20240821063625
HighPow Impedance: 56 Ohm
Implantable Lead Connection Status: 753985
Implantable Lead Connection Status: 753985
Implantable Lead Implant Date: 20230222
Implantable Lead Implant Date: 20230222
Implantable Lead Location: 753859
Implantable Lead Location: 753860
Implantable Lead Model: 5076
Implantable Pulse Generator Implant Date: 20230222
Lead Channel Impedance Value: 456 Ohm
Lead Channel Impedance Value: 456 Ohm
Lead Channel Impedance Value: 532 Ohm
Lead Channel Pacing Threshold Amplitude: 0.5 V
Lead Channel Pacing Threshold Amplitude: 0.75 V
Lead Channel Pacing Threshold Pulse Width: 0.4 ms
Lead Channel Pacing Threshold Pulse Width: 0.4 ms
Lead Channel Sensing Intrinsic Amplitude: 1.5 mV
Lead Channel Sensing Intrinsic Amplitude: 1.5 mV
Lead Channel Sensing Intrinsic Amplitude: 10.25 mV
Lead Channel Sensing Intrinsic Amplitude: 10.25 mV
Lead Channel Setting Pacing Amplitude: 1.5 V
Lead Channel Setting Pacing Amplitude: 1.5 V
Lead Channel Setting Pacing Pulse Width: 0.4 ms
Lead Channel Setting Sensing Sensitivity: 0.3 mV
Zone Setting Status: 755011
Zone Setting Status: 755011

## 2022-12-22 ENCOUNTER — Emergency Department
Admission: EM | Admit: 2022-12-22 | Discharge: 2022-12-22 | Disposition: A | Discharge status: Home / Self Care | Payer: Medicare Other | Attending: Emergency Medicine | Admitting: Emergency Medicine

## 2022-12-22 ENCOUNTER — Emergency Department: Payer: Medicare Other

## 2022-12-22 DIAGNOSIS — I110 Hypertensive heart disease with heart failure: Secondary | ICD-10-CM | POA: Diagnosis not present

## 2022-12-22 DIAGNOSIS — E119 Type 2 diabetes mellitus without complications: Secondary | ICD-10-CM | POA: Diagnosis not present

## 2022-12-22 DIAGNOSIS — I509 Heart failure, unspecified: Secondary | ICD-10-CM | POA: Insufficient documentation

## 2022-12-22 DIAGNOSIS — R109 Unspecified abdominal pain: Secondary | ICD-10-CM

## 2022-12-22 DIAGNOSIS — R1084 Generalized abdominal pain: Secondary | ICD-10-CM | POA: Diagnosis not present

## 2022-12-22 LAB — COMPREHENSIVE METABOLIC PANEL
ALT: 16 U/L (ref 0–44)
AST: 19 U/L (ref 15–41)
Albumin: 4.3 g/dL (ref 3.5–5.0)
Alkaline Phosphatase: 78 U/L (ref 38–126)
Anion gap: 13 (ref 5–15)
BUN: 21 mg/dL (ref 8–23)
CO2: 24 mmol/L (ref 22–32)
Calcium: 9.3 mg/dL (ref 8.9–10.3)
Chloride: 99 mmol/L (ref 98–111)
Creatinine, Ser: 1.6 mg/dL — ABNORMAL HIGH (ref 0.61–1.24)
GFR, Estimated: 45 mL/min — ABNORMAL LOW (ref >60)
Glucose, Bld: 197 mg/dL — ABNORMAL HIGH (ref 70–99)
Potassium: 4.4 mmol/L (ref 3.5–5.1)
Sodium: 136 mmol/L (ref 135–145)
Total Bilirubin: 1.1 mg/dL (ref 0.3–1.2)
Total Protein: 7.7 g/dL (ref 6.5–8.1)

## 2022-12-22 LAB — CBC
HCT: 40.2 % (ref 39.0–52.0)
Hemoglobin: 14.4 g/dL (ref 13.0–17.0)
MCH: 32.4 pg (ref 26.0–34.0)
MCHC: 35.8 g/dL (ref 30.0–36.0)
MCV: 90.3 fL (ref 80.0–100.0)
Platelets: 174 10*3/uL (ref 150–400)
RBC: 4.45 MIL/uL (ref 4.22–5.81)
RDW: 11.6 % (ref 11.5–15.5)
WBC: 5 10*3/uL (ref 4.0–10.5)
nRBC: 0 % (ref 0.0–0.2)

## 2022-12-22 LAB — URINALYSIS, ROUTINE W REFLEX MICROSCOPIC
Bacteria, UA: NONE SEEN
Bilirubin Urine: NEGATIVE
Glucose, UA: >=500 mg/dL — AB
Hgb urine dipstick: NEGATIVE
Ketones, ur: NEGATIVE mg/dL
Leukocytes,Ua: NEGATIVE
Nitrite: NEGATIVE
Protein, ur: NEGATIVE mg/dL
Specific Gravity, Urine: 1.012 (ref 1.005–1.030)
Squamous Epithelial / HPF: NONE SEEN /HPF (ref 0–5)
WBC, UA: NONE SEEN WBC/hpf (ref 0–5)
pH: 7 (ref 5.0–8.0)

## 2022-12-22 LAB — LIPASE, BLOOD: Lipase: 49 U/L (ref 11–51)

## 2022-12-22 MED ORDER — ONDANSETRON HCL 4 MG/2ML IJ SOLN
4.0000 mg | Freq: Once | INTRAMUSCULAR | Status: AC
  Administered 2022-12-22: 4 mg via INTRAVENOUS
  Filled 2022-12-22: qty 2

## 2022-12-22 MED ORDER — MORPHINE SULFATE (PF) 4 MG/ML IV SOLN
4.0000 mg | Freq: Once | INTRAVENOUS | Status: AC
  Administered 2022-12-22: 4 mg via INTRAVENOUS
  Filled 2022-12-22: qty 1

## 2022-12-22 MED ORDER — IOHEXOL 300 MG/ML  SOLN
80.0000 mL | Freq: Once | INTRAMUSCULAR | Status: AC | PRN
  Administered 2022-12-22: 80 mL via INTRAVENOUS

## 2022-12-22 MED ORDER — OXYCODONE HCL 5 MG PO TABS: 5.0000 mg | ORAL_TABLET | Freq: Three times a day (TID) | ORAL | 0 refills | Status: DC | PRN

## 2022-12-22 NOTE — ED Provider Notes (Signed)
[facility] Provider Note    Event Date/Time   First MD Initiated Contact with Patient 12/22/22 1136     (approximate)  History   Chief Complaint: Flank Pain  HPI  Jermaine Kramer is a 72 y.o. male Jermaine Kramer, Jermaine Kramer, Jermaine Kramer, Jermaine Kramer, Jermaine Kramer, presents to Jermaine emergency department for abdominal pain.  According to Jermaine patient for Jermaine past several days he has been experiencing left flank pain which has been worse over Jermaine last 24 hours to Jermaine point where Jermaine patient is having trouble moving due to Jermaine pain.  Patient states a history of recurrent colitis in Jermaine past which this feels identical.  States last bowel movement was yesterday somewhat loose.  Denies any vomiting.  No fever.  Patient describes his pain as a 10/10 in Jermaine left lower quadrant.  Physical Exam   Triage Vital Signs: ED Triage Vitals  Encounter Vitals Group     BP 12/22/22 1051 128/80     Systolic BP Percentile --      Diastolic BP Percentile --      Pulse Rate 12/22/22 1051 70     Resp 12/22/22 1051 18     Temp 12/22/22 1051 97.6 F (36.4 C)     Temp Source 12/22/22 1051 Oral     SpO2 12/22/22 1051 100 %     Weight 12/22/22 1052 179 lb 14.3 oz (81.6 kg)     Height 12/22/22 1052 5\' 5"  (1.651 m)     Head Circumference --      Peak Flow --      Pain Score 12/22/22 1052 10     Pain Loc --      Pain Education --      Exclude from Growth Chart --     Most recent vital signs: Vitals:   12/22/22 1051  BP: 128/80  Pulse: 70  Resp: 18  Temp: 97.6 F (36.4 C)  SpO2: 100%    General: Awake, no distress.  CV:  Good peripheral perfusion.  Regular rate and rhythm  Resp:  Normal effort.  Equal breath sounds bilaterally.  Abd:  No distention.  Moderately distended abdomen, wife states this is fairly typical.  Moderate diffuse tenderness without rebound or guarding, more so in Jermaine left lower quadrant.   ED Results / Procedures / Treatments    RADIOLOGY  I have reviewed and interpreted CT images I do not see any obvious significant abnormality such as obstruction on my evaluation. Radiology is read Jermaine CT scan is negative for acute finding.   MEDICATIONS ORDERED IN ED: Medications - No data to display   IMPRESSION / MDM / ASSESSMENT AND PLAN / ED COURSE  I reviewed Jermaine triage vital signs and Jermaine nursing notes.  Patient's presentation is most consistent with acute presentation with potential threat to life or bodily function.  Patient presents emergency department for abdominal pain mostly in left lower quadrant worsening over Jermaine last several days much worse today per patient.  Describes 10 and 10 pain left lower quadrant.  States loose stool, denies any vomiting or fever.  Patient's lab work today shows a reassuring CBC with a normal white blood cell count, reassuring chemistry with normal LFTs.  Normal lipase.  Urinalysis shows no concerning findings.  We will obtain a CT scan treat pain nausea and continue to closely monitor while awaiting results.  Patient agreeable to plan of care.  CT scan essentially negative.  Patient  states he is feeling much better after pain medication.  Given Jermaine patient's reassuring labs reassuring vitals reassuring CT I believe Jermaine patient will be safe for discharge home.  I do not have a clear cause for Jermaine patient's abdominal pain at this time.  Will discharge with a short course of pain medication I discussed return precautions for any return of/worsening pain fever.  Otherwise patient will follow-up with his doctor.  FINAL CLINICAL IMPRESSION(S) / ED DIAGNOSES   Abdominal pain  Rx / DC Orders   Oxycodone  Note:  This document was prepared using Dragon voice recognition software and may include unintentional dictation errors.   Minna Antis, MD 12/22/22 1510

## 2022-12-22 NOTE — ED Triage Notes (Signed)
Pt here via [facility] from home with left flank pain. Pt has hx of colitis. Pt states any movement makes his pain worse, seen here for same a number of times. Pt able to walk, vitals WNL. Hx of DM.   200-cbg

## 2022-12-22 NOTE — Discharge Instructions (Signed)
Take your pain medication as needed, as prescribed.  Do not drink alcohol or drive while taking this medication.  If you find that the abdominal pain returns or fails to improve, you develop a fever or any other symptom personally concerning to yourself please return to the emergency department for further evaluation.  Otherwise please follow-up with your doctor 1 to 2 days for recheck/reevaluation.

## 2022-12-29 ENCOUNTER — Encounter: Payer: Self-pay | Admitting: Cardiology

## 2022-12-29 ENCOUNTER — Ambulatory Visit: Payer: Medicare Other | Attending: Cardiology | Admitting: Cardiology

## 2022-12-29 VITALS — BP 102/62 | HR 70 | Ht 65.0 in | Wt 180.0 lb

## 2022-12-29 DIAGNOSIS — Z95810 Presence of automatic (implantable) cardiac defibrillator: Secondary | ICD-10-CM

## 2022-12-29 DIAGNOSIS — I10 Essential (primary) hypertension: Secondary | ICD-10-CM | POA: Diagnosis present

## 2022-12-29 DIAGNOSIS — I5022 Chronic systolic (congestive) heart failure: Secondary | ICD-10-CM

## 2022-12-29 NOTE — Progress Notes (Signed)
  Electrophysiology Office Follow up Visit Note:    Date:  12/29/2022   ID:  Jermaine Kramer, Jermaine Kramer May 12, 1950, MRN [id number]  PCP:  Jermaine Coins, PA-C  [facility] Cardiologist:  None  [facility] Electrophysiologist:  Jermaine Prude, MD    Interval History:    Jermaine Kramer is a 73 y.o. male who presents for a follow up visit.   He underwent a CCM implant on Sep 04, 2022. He has an ICD in situ.  Remote monitoring has shown stable device function.  He is doing well today.  He is with his wife today.  He reports an improved energy level after the CCM implant.  He is doing yard work now and feels an improvement in his symptoms.     Past medical, surgical, social and family history were reviewed.  ROS:   Please see the history of present illness.    All other systems reviewed and are negative.  EKGs/Labs/Other Studies Reviewed:    The following studies were reviewed today:  December 29, 2022 in clinic device interrogation personally reviewed Battery and lead parameter stable.  No programming changes made today.  CCM interrogation also performed today in clinic with representative Device functioning appropriately.       Physical Exam:    VS:  BP 102/62   Pulse 70   Ht 5\' 5"  (1.651 m)   Wt 180 lb (81.6 kg)   SpO2 98%   BMI 29.95 kg/m     Wt Readings from Last 3 Encounters:  12/29/22 180 lb (81.6 kg)  12/22/22 179 lb 14.3 oz (81.6 kg)  10/23/22 180 lb (81.6 kg)     GEN:  Well nourished, well developed in no acute distress CARDIAC: RRR, no murmurs, rubs, gallops.  Bilateral prepectoral pockets well-healed.  No hematoma or significant pain. RESPIRATORY:  Clear to auscultation without rales, wheezing or rhonchi       ASSESSMENT:    1. Chronic systolic heart failure (HCC)   2. Primary hypertension   3. Implantable cardioverter-defibrillator (ICD) in situ    PLAN:    In order of problems listed above:  #Chronic systolic heart failure #ICD in  situ #CCM in situ NYHA class II today.  Warm and dry on exam. Continue torsemide, spironolactone, metoprolol, losartan  ICD in CCM device is functioning appropriately.  Continue remote monitoring of ICD.  #Atrial fibrillation Continue Eliquis for stroke prophylaxis Low burden on ICD check  Follow-up 1 year with APP.     Signed, Jermaine Dunn, MD, [facility], [facility] 12/29/2022 8:22 AM    Electrophysiology [facility]

## 2022-12-29 NOTE — Patient Instructions (Addendum)
 Medication Instructions:  The current medical regimen is effective;  continue present plan and medications.  *If you need a refill on your cardiac medications before your next appointment, please call your pharmacy*   Follow-Up: At [facility], you and your health needs are our priority.  As part of our continuing mission to provide you with exceptional heart care, we have created designated Provider Care Teams.  These Care Teams include your primary Cardiologist (physician) and Advanced Practice Providers (APPs -  Physician Assistants and Nurse Practitioners) who all work together to provide you with the care you need, when you need it.  We recommend signing up for the patient portal called "MyChart".  Sign up information is provided on this After Visit Summary.  MyChart is used to connect with patients for Virtual Visits (Telemedicine).  Patients are able to view lab/test results, encounter notes, upcoming appointments, etc.  Non-urgent messages can be sent to your provider as well.   To learn more about what you can do with MyChart, go to [email or web address].    Your next appointment:   12 month(s)  Provider:   Sherie Don, NP

## 2023-01-03 NOTE — Progress Notes (Signed)
Remote ICD transmission.   

## 2023-01-23 ENCOUNTER — Encounter: Payer: Self-pay | Admitting: Pharmacy Technician

## 2023-01-23 ENCOUNTER — Ambulatory Visit: Payer: Medicare Other | Attending: Family | Admitting: Family

## 2023-01-23 ENCOUNTER — Encounter: Payer: Self-pay | Admitting: Family

## 2023-01-23 VITALS — BP 99/66 | HR 78 | Ht 65.0 in | Wt 178.0 lb

## 2023-01-23 DIAGNOSIS — I130 Hypertensive heart and chronic kidney disease with heart failure and stage 1 through stage 4 chronic kidney disease, or unspecified chronic kidney disease: Secondary | ICD-10-CM | POA: Diagnosis not present

## 2023-01-23 DIAGNOSIS — N189 Chronic kidney disease, unspecified: Secondary | ICD-10-CM | POA: Diagnosis not present

## 2023-01-23 DIAGNOSIS — K654 Sclerosing mesenteritis: Secondary | ICD-10-CM | POA: Diagnosis not present

## 2023-01-23 DIAGNOSIS — E1122 Type 2 diabetes mellitus with diabetic chronic kidney disease: Secondary | ICD-10-CM | POA: Diagnosis not present

## 2023-01-23 DIAGNOSIS — I2510 Atherosclerotic heart disease of native coronary artery without angina pectoris: Secondary | ICD-10-CM | POA: Insufficient documentation

## 2023-01-23 DIAGNOSIS — I110 Hypertensive heart disease with heart failure: Secondary | ICD-10-CM | POA: Diagnosis present

## 2023-01-23 DIAGNOSIS — I480 Paroxysmal atrial fibrillation: Secondary | ICD-10-CM | POA: Diagnosis not present

## 2023-01-23 DIAGNOSIS — Z7901 Long term (current) use of anticoagulants: Secondary | ICD-10-CM | POA: Diagnosis not present

## 2023-01-23 DIAGNOSIS — Z7984 Long term (current) use of oral hypoglycemic drugs: Secondary | ICD-10-CM | POA: Insufficient documentation

## 2023-01-23 DIAGNOSIS — I4891 Unspecified atrial fibrillation: Secondary | ICD-10-CM | POA: Insufficient documentation

## 2023-01-23 DIAGNOSIS — I5022 Chronic systolic (congestive) heart failure: Secondary | ICD-10-CM | POA: Insufficient documentation

## 2023-01-23 DIAGNOSIS — I10 Essential (primary) hypertension: Secondary | ICD-10-CM | POA: Diagnosis not present

## 2023-01-23 DIAGNOSIS — N1832 Chronic kidney disease, stage 3b: Secondary | ICD-10-CM

## 2023-01-23 NOTE — Consult Note (Signed)
[facility] - HEART FAILURE CLINIC - PHARMACIST COUNSELING NOTE  Guideline-Directed Medical Therapy/Evidence Based Medicine  ACE/ARB/ARNI: Losartan 12.5 mg daily Beta Blocker: Metoprolol succinate 12.5 mg daily Aldosterone Antagonist: Spironolactone 12.5 mg daily Diuretic: Torsemide 40 mg daily (and an additional 20 mg daily PRN weight gain/edema) SGLT2i:  N/A  Adherence Assessment  Do you ever forget to take your medication? [] Yes [x] No  Do you ever skip doses due to side effects? [] Yes [x] No  Do you have trouble affording your medicines? [x] Yes [] No  Are you ever unable to pick up your medication due to transportation difficulties? [] Yes [x] No  Do you ever stop taking your medications because you don't believe they are helping? [] Yes [x] No  Do you check your weight daily? [x] Yes [] No   Adherence strategy: Pill box and spousal help  Barriers to obtaining medications: Cost when in the donut hole Patient able to afford his weekly semaglutide due to assistance from [employer]  Vital signs: HR 78, BP 99.66, weight (pounds) 178 ECHO: Date 06/29/2022, EF 20-25%, notes LV global hypokinesis; moderate LV dilation; mildly reduced RV function and normal PA systolic pressure; grade I DD; moderate MVR; mild AVR and AV sclerosis/calcification      Latest Ref Rng & Units 12/22/2022   10:54 AM 08/14/2022   10:10 AM 07/28/2022    9:42 PM  BMP  Glucose 70 - 99 mg/dL 829  562  130   BUN 8 - 23 mg/dL 21  33  48   Creatinine 0.61 - 1.24 mg/dL 8.65  7.84  6.96   Sodium 135 - 145 mmol/L 136  135  129   Potassium 3.5 - 5.1 mmol/L 4.4  4.7  4.7   Chloride 98 - 111 mmol/L 99  102  91   CO2 22 - 32 mmol/L 24  24  23    Calcium 8.9 - 10.3 mg/dL 9.3  8.9  9.5     Past Medical History:  Diagnosis Date   Arrhythmia    atrial fibrillation   CHF (congestive heart failure) (HCC)    Coronary artery disease    Diabetes mellitus without complication (HCC)     GERD (gastroesophageal reflux disease)    Hypercholesteremia    Hypertension    Sleep apnea    Stroke [facility])     ASSESSMENT 72 year old male who presents to the HF clinic for follow-up. Patient has no complaints today aside from usual SOB. Recent medication changes include: discontinuation of insulin glargine (only needed while taking steroids), increasing tamsulosin to 0.4 mg twice daily, and taking omeprazole 40 mg once daily down from twice daily. Patient also endorses having trouble affording medications once he enters the donut hole although they told me that he has a steady supply of semaglutide due to assistance from the [employer].  Notably, patient's BP is 99/66 today. His BP normally runs on lower side but due to steadily decreasing weight over the past year, patient may me more susceptible due to medication effects.  Recent ED Visit (past 6 months):  Date -  12/22/2022, CC - Abnormal pain  07/28/2022, CC - hyperglycemia  PLAN CHF / HTN Continue losartan 12.5 mg daily, metoprolol succinate 12.5 mg daily, spironolactone 12.5 mg daily, and torsemide 40 mg daily Recommend continuing to monitor weight and blood pressure daily and if noticing the blood pressure decreasing further, consider decreasing torsemide to 20 mg daily Atrial fibrillation CHADSVASc 5 HR controlled today at 78 Continue metoprolol succinate 12.5  mg daily and apixaban 5 mg twice daily DM 06/10/2022 A1c 8.4% Continue metformin 500 mg twice daily, and semaglutide 2 mg weekly Continue to avoid SGLT2i given h/o yeast infection while on empagliflozin HLD 08/03/2021 LDL 42 Continue atorvastatin 80 mg daily    Time spent: 15 minutes  Orson Aloe, Pharm.D. Clinical Pharmacist 01/23/2023 9:30 AM

## 2023-01-23 NOTE — Progress Notes (Signed)
PCP: Gardiner Coins, PA-C (last seen 09/24) Primary Cardiologist: Marcina Millard, MD (last seen 05/24) [facility] provider: Marca Ancona, MD (last seen 02/24)  HPI:  Mr Rihn is a 72 y/o male with a history of leukemia, DM, mesenteric panniculitis, CAD (CABG 02/16/21), AF, sleep apnea, hyperlipidemia, HTN, stroke, GERD & chronic heart failure. Had CCM generator with A/V lead insertion on 09/04/22.  Was in the ED 04/07/22 due to cough/ SOB. Admitted 06/09/22 due to LLQ abdominal pain. CT showed mesenteric pancolitis. Was in the ED 07/21/22 due to LUQ pain.Was in the ED 07/28/22 due to hyperglycemia. Was in the ED 12/22/22 due to left flank pain over the last 24 hours. Abdominal CT negative. Reassuring CBC with a normal white blood cell count, reassuring chemistry with normal LFTs. Normal lipase. Urinalysis shows no concerning findings.     Echo 05/16/21: EF <20% along with mild/moderate LAE and mild/moderate MR.  Echo 09/16/21: EF 30-35% along with mild MR. Echo 06/29/22: EF 25% with moderate MR.    LHC 02/10/21:  2v CAD, including 100% midRCA, 70% midLAD with 80% focal distal LAD, 70% D1  Elevated LVEDP ( )   Cardiac MRI 01/27/21:  1. The left ventricle is mildly dilated.  Wall thickness is normal.     a.  The inferior wall is akinetic.     b.  The basal septum is severely hypokinetic.  The mid-distal septum is moderately hypokinetic.     c.  The basal anterior wall is severely hypokinetic. The mid-distal anterior wall and apex are  moderately hypokinetic.     d.  The lateral wall is moderately. hypokinetic.   Global LV systolic function is severely reduced with an LVEF calculated at 30%.    2. The right ventricle is normal in cavity size and wall thickness.  There is mild global hypokinesis with  mildly reduced RV systolic function.  The RVEF is calculated at 43%.   He presents today for a HF follow-up visit with a chief complaint of minimal fatigue with moderate exertion. Chronic in  nature. Has associated shortness of breath and occasional chest tightness along with this. Denies chest pain, cough, palpitations, abdominal distention, pedal edema, dizziness, blurry vision or difficulty sleeping.   Was having issues with low blood pressure and his spironolactone was held but his blood pressure didn't change so it has been resumed. Wife feels like patient is doing better since he had the CCM generator placed earlier this year.   Had CCM generator with A/V lead insertion on 09/04/22. Wife notices that since this was placed, he has more energy and they've been able to do more around the house and go more places than previously.   ROS: All systems negative except as listed in HPI, PMH and Problem List.  SH:  Social History   Socioeconomic History   Marital status: Married    Spouse name: Not on file   Number of children: Not on file   Years of education: Not on file   Highest education level: Not on file  Occupational History   Not on file  Tobacco Use   Smoking status: Never   Smokeless tobacco: Never  Vaping Use   Vaping status: Never Used  Substance and Sexual Activity   Alcohol use: Not Currently   Drug use: No   Sexual activity: Not Currently  Other Topics Concern   Not on file  Social History Narrative   Not on file   Social Determinants of Health   Financial  Resource Strain: Low Risk  (10/03/2022)   Received from [facility], [facility]   Overall Financial Resource Strain (CARDIA)    Difficulty of Paying Living Expenses: Not hard at all  Food Insecurity: No Food Insecurity (10/03/2022)   Received from [facility], [facility]   Hunger Vital Sign    Worried About Running Out of Food in the Last Year: Never true    Ran Out of Food in the Last Year: Never true  Transportation Needs: No Transportation Needs (10/03/2022)   Received from [facility], [facility]   [facility] - Transportation    In the past 12 months, has lack of transportation kept you from medical appointments or from getting medications?: No    Lack of Transportation (Non-Medical): No  Physical Activity: Not on file  Stress: Not on file  Social Connections: Not on file  Intimate Partner Violence: Not At Risk (06/10/2022)   Humiliation, Afraid, Rape, and Kick questionnaire    Fear of Current or Ex-Partner: No    Emotionally Abused: No    Physically Abused: No    Sexually Abused: No    FH:  Family History  Problem Relation Age of Onset   Parkinson's disease Mother    Lupus Mother    Heart disease Father     Past Medical History:  Diagnosis Date   Arrhythmia    atrial fibrillation   CHF (congestive heart failure) (HCC)    Coronary artery disease    Diabetes mellitus without complication (HCC)    GERD (gastroesophageal reflux disease)    Hypercholesteremia    Hypertension    Sleep apnea    Stroke [facility])     Current Outpatient Medications  Medication Sig Dispense Refill   apixaban (ELIQUIS) 5 MG TABS tablet Take 1 tablet (5 mg total) by mouth 2 (two) times daily. 180 tablet 3   atorvastatin (LIPITOR) 80 MG tablet Take 1 tablet (80 mg total) by mouth daily. 30 tablet 5   calcitRIOL (ROCALTROL) 0.25 MCG capsule Take 0.25 mcg by mouth daily.     LANTUS 100 UNIT/ML injection Inject 12 Units into the skin at bedtime.     losartan (COZAAR) 25 MG tablet TAKE 1/2 TABLET BY MOUTH EVERY DAY 45 tablet 2   metFORMIN (GLUCOPHAGE) 500 MG tablet Take 500 mg by mouth 2 (two) times daily.     metoprolol succinate (TOPROL-XL) 25 MG 24 hr tablet TAKE 0.5 TABLETS BY MOUTH EVERY EVENING. (Patient taking differently: Take 12.5 mg by mouth daily.) 45 tablet 3   omeprazole (PRILOSEC) 40 MG capsule Take 40 mg by mouth 2 (two) times daily.     ondansetron (ZOFRAN) 4 MG tablet Take 4 mg by mouth every 8 (eight) hours as needed.     oxyCODONE (ROXICODONE) 5 MG immediate release tablet  Take 1 tablet (5 mg total) by mouth every 8 (eight) hours as needed. 12 tablet 0   Semaglutide,0.25 or 0.5MG /DOS, (OZEMPIC, 0.25 OR 0.5 MG/DOSE,) 2 MG/1.5ML SOPN Inject into the skin once a week.     spironolactone (ALDACTONE) 25 MG tablet TAKE 1 TABLET (25 MG TOTAL) BY MOUTH DAILY. (Patient taking differently: Take 12.5 mg by mouth daily.) 90 tablet 3   tamsulosin (FLOMAX) 0.4 MG CAPS capsule Take 0.4 mg by mouth daily.     torsemide (DEMADEX) 20 MG tablet TAKE 2 TABLETS (40 MG TOTAL) BY MOUTH DAILY. AND ADDITIONAL 20MG  IF NEEDED 180 tablet  1   No current facility-administered medications for this visit.   Vitals:   01/23/23 0939  BP: 99/66  Pulse: 78  SpO2: 99%  Weight: 178 lb (80.7 kg)  Height: 5\' 5"  (1.651 m)   Wt Readings from Last 3 Encounters:  01/23/23 178 lb (80.7 kg)  12/29/22 180 lb (81.6 kg)  12/22/22 179 lb 14.3 oz (81.6 kg)   Lab Results  Component Value Date   CREATININE 1.60 (H) 12/22/2022   CREATININE 1.95 (H) 08/14/2022   CREATININE 2.14 (H) 07/28/2022   PHYSICAL EXAM:  General:  Well appearing. No resp difficulty HEENT: normal Neck: supple. JVP flat. No lymphadenopathy or thryomegaly appreciated. Cor: PMI normal. Regular rate & rhythm. No rubs, gallops or murmurs. Lungs: clear Abdomen: soft, nontender, nondistended. No hepatosplenomegaly. No bruits or masses.  Extremities: no cyanosis, clubbing, rash, edema Neuro: alert & oriented x3, cranial nerves grossly intact. Moves all 4 extremities w/o difficulty. Affect pleasant.   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  -  Echo 09/16/21: EF 30-35% along with mild MR. - Echo 06/29/22: EF 25% with moderate MR.   - LHC 02/10/21: 2v CAD, including 100% midRCA, 70% midLAD with 80% focal distal LAD, 70% D1  elevated LVEDP ( )  - Cardiac MRI 01/27/21: 1. The left ventricle is mildly dilated. Wall thickness is normal. The inferior wall is akinetic. The basal septum is severely hypokinetic. The mid-distal septum is moderately hypokinetic. The basal anterior wall is severely hypokinetic. The mid-distal anterior wall and apex are moderately hypokinetic. The lateral wall is moderately. hypokinetic. 2. The right ventricle is normal in cavity size and wall thickness. There is mild global hypokinesis with mildly reduced RV systolic function. The RVEF is calculated at 43%.  - not adding salt and had been reading food labels for sodium content  - continue losartan 12.5mg  daily - continue metoprolol succinate 12.5mg  daily - continue spironolactone to 12.5mg  daily  - continue torsemide 40mg  daily w/ additional 20mg  PRN; has not had to take additional dose in quite some time - current BP will not allow for med titration; if BP lowers anymore, consider decreasing torsemide to 20mg  daily - allergic to empagliflozin with a yeast infection - saw [facility] provider Jearld Pies) 02/24 - saw EP Lalla Brothers) 03/24 - cardiac contractility modulator placed 05/24  - had AICD implanted 06/22/21; no shocks have been delivered - BNP 07/21/22 was 837.9  2: HTN- - BP 99/66; no symptoms, continue current meds - BP gets checked daily at home - saw PCP Lowell Guitar) 09/24 - BMP 12/22/22 reviewed and showed sodium 136, potassium 4.4, creatinine 1.6 & GFR 45  3: DM with CKD- - A1c 01/15/23 was 8.3% - ozempic 2mg  weekly; less GI upset with this compared to trulicity - saw nephrology Cherylann Ratel) 07/24  4: Atrial fibrillation- - saw EP Lalla Brothers) 08/24 - continue metoprolol succinate 12.5mg  daily - continue apixaban 5mg  BID  -if he has recurrent AF, would favor  ablation. 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   5: CAD- - saw cardiology (  Drane) 05/24 - CABG with a LIMA to LAD, SVG to ramus, and SVG to PDA on 02/16/21 - continue atorvastatin 80 mg daily.  - no ASA given stable CAD and apixaban use  6: Mesenteric panniculitis- - saw GI Norma Fredrickson) 03/24 - no longer taking prednisone  Return in 05/2023, sooner if needed.

## 2023-01-23 NOTE — Patient Instructions (Addendum)
If the top of your blood pressure stays close to 90 & your fluid status/ weight is stable, decrease your fluid pill to 20mg  daily.    Keep up the good work and it was good to see you today!

## 2023-02-25 ENCOUNTER — Other Ambulatory Visit: Payer: Self-pay | Admitting: Family

## 2023-03-21 ENCOUNTER — Ambulatory Visit: Payer: Medicare Other

## 2023-03-21 DIAGNOSIS — I5042 Chronic combined systolic (congestive) and diastolic (congestive) heart failure: Secondary | ICD-10-CM

## 2023-03-21 DIAGNOSIS — I255 Ischemic cardiomyopathy: Secondary | ICD-10-CM

## 2023-03-22 LAB — CUP PACEART REMOTE DEVICE CHECK
Battery Remaining Longevity: 101 mo
Battery Voltage: 3 V
Brady Statistic AP VP Percent: 0.09 %
Brady Statistic AP VS Percent: 12.56 %
Brady Statistic AS VP Percent: 0.03 %
Brady Statistic AS VS Percent: 87.32 %
Brady Statistic RA Percent Paced: 12.44 %
Brady Statistic RV Percent Paced: 0.12 %
Date Time Interrogation Session: 20241120031707
HighPow Impedance: 58 Ohm
Implantable Lead Connection Status: 753985
Implantable Lead Connection Status: 753985
Implantable Lead Implant Date: 20230222
Implantable Lead Implant Date: 20230222
Implantable Lead Location: 753859
Implantable Lead Location: 753860
Implantable Lead Model: 5076
Implantable Pulse Generator Implant Date: 20230222
Lead Channel Impedance Value: 475 Ohm
Lead Channel Impedance Value: 532 Ohm
Lead Channel Impedance Value: 646 Ohm
Lead Channel Pacing Threshold Amplitude: 0.625 V
Lead Channel Pacing Threshold Amplitude: 0.75 V
Lead Channel Pacing Threshold Pulse Width: 0.4 ms
Lead Channel Pacing Threshold Pulse Width: 0.4 ms
Lead Channel Sensing Intrinsic Amplitude: 1.375 mV
Lead Channel Sensing Intrinsic Amplitude: 1.375 mV
Lead Channel Sensing Intrinsic Amplitude: 9.875 mV
Lead Channel Sensing Intrinsic Amplitude: 9.875 mV
Lead Channel Setting Pacing Amplitude: 1.5 V
Lead Channel Setting Pacing Amplitude: 1.5 V
Lead Channel Setting Pacing Pulse Width: 0.4 ms
Lead Channel Setting Sensing Sensitivity: 0.3 mV
Zone Setting Status: 755011
Zone Setting Status: 755011

## 2023-04-05 ENCOUNTER — Emergency Department
Admission: EM | Admit: 2023-04-05 | Discharge: 2023-04-06 | Disposition: A | Discharge status: Home / Self Care | Payer: Medicare Other | Attending: Emergency Medicine | Admitting: Emergency Medicine

## 2023-04-05 ENCOUNTER — Other Ambulatory Visit: Payer: Self-pay

## 2023-04-05 DIAGNOSIS — T887XXA Unspecified adverse effect of drug or medicament, initial encounter: Secondary | ICD-10-CM | POA: Insufficient documentation

## 2023-04-05 DIAGNOSIS — Z7901 Long term (current) use of anticoagulants: Secondary | ICD-10-CM | POA: Insufficient documentation

## 2023-04-05 DIAGNOSIS — R531 Weakness: Secondary | ICD-10-CM | POA: Diagnosis present

## 2023-04-05 DIAGNOSIS — Z951 Presence of aortocoronary bypass graft: Secondary | ICD-10-CM | POA: Diagnosis not present

## 2023-04-05 DIAGNOSIS — T50905A Adverse effect of unspecified drugs, medicaments and biological substances, initial encounter: Secondary | ICD-10-CM

## 2023-04-05 LAB — URINALYSIS, ROUTINE W REFLEX MICROSCOPIC
Bilirubin Urine: NEGATIVE
Glucose, UA: NEGATIVE mg/dL
Ketones, ur: NEGATIVE mg/dL
Nitrite: NEGATIVE
Protein, ur: NEGATIVE mg/dL
Specific Gravity, Urine: 1.011 (ref 1.005–1.030)
Squamous Epithelial / HPF: 0 /[HPF] (ref 0–5)
pH: 5 (ref 5.0–8.0)

## 2023-04-05 LAB — BASIC METABOLIC PANEL
Anion gap: 11 (ref 5–15)
BUN: 31 mg/dL — ABNORMAL HIGH (ref 8–23)
CO2: 23 mmol/L (ref 22–32)
Calcium: 8.5 mg/dL — ABNORMAL LOW (ref 8.9–10.3)
Chloride: 97 mmol/L — ABNORMAL LOW (ref 98–111)
Creatinine, Ser: 1.97 mg/dL — ABNORMAL HIGH (ref 0.61–1.24)
GFR, Estimated: 35 mL/min — ABNORMAL LOW (ref >60)
Glucose, Bld: 174 mg/dL — ABNORMAL HIGH (ref 70–99)
Potassium: 3.9 mmol/L (ref 3.5–5.1)
Sodium: 131 mmol/L — ABNORMAL LOW (ref 135–145)

## 2023-04-05 LAB — CBC
HCT: 35.2 % — ABNORMAL LOW (ref 39.0–52.0)
Hemoglobin: 13 g/dL (ref 13.0–17.0)
MCH: 34 pg (ref 26.0–34.0)
MCHC: 36.9 g/dL — ABNORMAL HIGH (ref 30.0–36.0)
MCV: 92.1 fL (ref 80.0–100.0)
Platelets: 129 10*3/uL — ABNORMAL LOW (ref 150–400)
RBC: 3.82 MIL/uL — ABNORMAL LOW (ref 4.22–5.81)
RDW: 12.5 % (ref 11.5–15.5)
WBC: 6.6 10*3/uL (ref 4.0–10.5)
nRBC: 0 % (ref 0.0–0.2)

## 2023-04-05 NOTE — ED Triage Notes (Signed)
Patient states weakness and poor appetite since starting Ozempic one month ago; today weakness is worse.

## 2023-04-06 MED ORDER — ONDANSETRON 4 MG PO TBDP
4.0000 mg | ORAL_TABLET | Freq: Three times a day (TID) | ORAL | 0 refills | Status: DC | PRN
Start: 2023-04-06 — End: 2023-04-16

## 2023-04-06 NOTE — ED Provider Notes (Signed)
[facility] Provider Note    Event Date/Time   First MD Initiated Contact with Patient 04/05/23 2339     (approximate)   History   Weakness   HPI  Jermaine Kramer is a 72 y.o. male who presents to the ED for evaluation of Weakness   I review a cardiology clinic visit from 2 weeks ago.  CABG x3 2022, reduced ejection fraction, ICD in place.  A-fib on Eliquis.  Chronic exertional dyspnea and soft blood pressures.  Patient presents with his wife for evaluation of chronic intermittent weakness since he started taking Ozempic.  Injections every Wednesday.  He reports feeling great Monday and Tuesday and then feeling worse as soon as he takes the medication and is the same cycle every week for the past 5 weeks.  Reports his fluid and breathing is doing well without concerns for increased fluid retention or unintentional weight gain.  Chronic dyspnea on exertion without acute worsening.  No fevers, falls or syncope.  Nausea without emesis.  No abdominal pain.   Physical Exam   Triage Vital Signs: ED Triage Vitals  Encounter Vitals Group     BP 04/05/23 1854 91/69     Systolic BP Percentile --      Diastolic BP Percentile --      Pulse Rate 04/05/23 1854 100     Resp 04/05/23 1854 20     Temp 04/05/23 1854 98.9 F (37.2 C)     Temp Source 04/05/23 1854 Oral     SpO2 04/05/23 1854 98 %     Weight 04/05/23 1852 170 lb (77.1 kg)     Height 04/05/23 1852 5\' 5"  (1.651 m)     Head Circumference --      Peak Flow --      Pain Score 04/05/23 1852 0     Pain Loc --      Pain Education --      Exclude from Growth Chart --     Most recent vital signs: Vitals:   04/06/23 0030 04/06/23 0200  BP: 101/68   Pulse: 93   Resp: (!) 22   Temp:  98.7 F (37.1 C)  SpO2: 96%     General: Awake, no distress.  CV:  Good peripheral perfusion.  Resp:  Normal effort.  Abd:  No distention.  Soft and benign MSK:  No deformity noted.  Neuro:  No focal deficits  appreciated. Other:     ED Results / Procedures / Treatments   Labs (all labs ordered are listed, but only abnormal results are displayed) Labs Reviewed  BASIC METABOLIC PANEL - Abnormal; Notable for the following components:      Result Value   Sodium 131 (*)    Chloride 97 (*)    Glucose, Bld 174 (*)    BUN 31 (*)    Creatinine, Ser 1.97 (*)    Calcium 8.5 (*)    GFR, Estimated 35 (*)    All other components within normal limits  CBC - Abnormal; Notable for the following components:   RBC 3.82 (*)    HCT 35.2 (*)    MCHC 36.9 (*)    Platelets 129 (*)    All other components within normal limits  URINALYSIS, ROUTINE W REFLEX MICROSCOPIC - Abnormal; Notable for the following components:   Color, Urine YELLOW (*)    APPearance CLEAR (*)    Hgb urine dipstick SMALL (*)    Leukocytes,Ua TRACE (*)  Bacteria, UA RARE (*)    All other components within normal limits    EKG Sinus tachycardia with rate of 124 bpm.  Normal axis.  Right bundle.  No STEMI.  RADIOLOGY   Official radiology report(s): No results found.  PROCEDURES and INTERVENTIONS:  Procedures  Medications - No data to display   IMPRESSION / MDM / ASSESSMENT AND PLAN / ED COURSE  I reviewed the triage vital signs and the nursing notes.  Differential diagnosis includes, but is not limited to, CHF exacerbation, blood loss anemia, electrolyte derangement, AKI, sepsis  {Patient presents with symptoms of an acute illness or injury that is potentially life-threatening.  Patient presents with likely side effect of his Ozempic and suitable for outpatient management.  We discussed it is likely worthwhile to go ahead and stop the Ozempic and follow-up with his PCP.  We discussed close return precautions.  Blood work with CKD near baseline, normal CBC.  Urine without infectious features.      FINAL CLINICAL IMPRESSION(S) / ED DIAGNOSES   Final diagnoses:  Generalized weakness  Medication side effect,  initial encounter     Rx / DC Orders   ED Discharge Orders          Ordered    ondansetron (ZOFRAN-ODT) 4 MG disintegrating tablet  Every 8 hours PRN        04/06/23 0147             Note:  This document was prepared using Dragon voice recognition software and may include unintentional dictation errors.   Delton Prairie, MD 04/06/23 [phone]

## 2023-04-16 ENCOUNTER — Telehealth: Payer: Self-pay

## 2023-04-16 ENCOUNTER — Emergency Department
Admission: EM | Admit: 2023-04-16 | Discharge: 2023-04-16 | Disposition: A | Discharge status: Home / Self Care | Payer: Medicare Other | Attending: Emergency Medicine | Admitting: Emergency Medicine

## 2023-04-16 ENCOUNTER — Encounter: Payer: Self-pay | Admitting: Emergency Medicine

## 2023-04-16 ENCOUNTER — Other Ambulatory Visit: Payer: Self-pay

## 2023-04-16 DIAGNOSIS — E119 Type 2 diabetes mellitus without complications: Secondary | ICD-10-CM | POA: Diagnosis not present

## 2023-04-16 DIAGNOSIS — I110 Hypertensive heart disease with heart failure: Secondary | ICD-10-CM | POA: Diagnosis not present

## 2023-04-16 DIAGNOSIS — I2510 Atherosclerotic heart disease of native coronary artery without angina pectoris: Secondary | ICD-10-CM | POA: Insufficient documentation

## 2023-04-16 DIAGNOSIS — Z951 Presence of aortocoronary bypass graft: Secondary | ICD-10-CM | POA: Diagnosis not present

## 2023-04-16 DIAGNOSIS — I959 Hypotension, unspecified: Secondary | ICD-10-CM | POA: Insufficient documentation

## 2023-04-16 DIAGNOSIS — I509 Heart failure, unspecified: Secondary | ICD-10-CM | POA: Diagnosis not present

## 2023-04-16 DIAGNOSIS — R638 Other symptoms and signs concerning food and fluid intake: Secondary | ICD-10-CM | POA: Insufficient documentation

## 2023-04-16 LAB — BASIC METABOLIC PANEL
Anion gap: 8 (ref 5–15)
BUN: 26 mg/dL — ABNORMAL HIGH (ref 8–23)
CO2: 26 mmol/L (ref 22–32)
Calcium: 9.1 mg/dL (ref 8.9–10.3)
Chloride: 101 mmol/L (ref 98–111)
Creatinine, Ser: 1.53 mg/dL — ABNORMAL HIGH (ref 0.61–1.24)
GFR, Estimated: 47 mL/min — ABNORMAL LOW (ref >60)
Glucose, Bld: 148 mg/dL — ABNORMAL HIGH (ref 70–99)
Potassium: 4.4 mmol/L (ref 3.5–5.1)
Sodium: 135 mmol/L (ref 135–145)

## 2023-04-16 LAB — CBC
HCT: 36.7 % — ABNORMAL LOW (ref 39.0–52.0)
Hemoglobin: 12.7 g/dL — ABNORMAL LOW (ref 13.0–17.0)
MCH: 32.6 pg (ref 26.0–34.0)
MCHC: 34.6 g/dL (ref 30.0–36.0)
MCV: 94.3 fL (ref 80.0–100.0)
Platelets: 227 10*3/uL (ref 150–400)
RBC: 3.89 MIL/uL — ABNORMAL LOW (ref 4.22–5.81)
RDW: 12.6 % (ref 11.5–15.5)
WBC: 6.1 10*3/uL (ref 4.0–10.5)
nRBC: 0 % (ref 0.0–0.2)

## 2023-04-16 LAB — CBG MONITORING, ED: Glucose-Capillary: 84 mg/dL (ref 70–99)

## 2023-04-16 MED ORDER — ONDANSETRON 4 MG PO TBDP: 4.0000 mg | ORAL_TABLET | Freq: Three times a day (TID) | ORAL | 0 refills | Status: DC | PRN

## 2023-04-16 NOTE — ED Provider Notes (Signed)
[facility] Provider Note    Event Date/Time   First MD Initiated Contact with Patient 04/16/23 2146     (approximate)   History   Chief Complaint Hypotension   HPI  Jermaine Kramer is a 72 y.o. male with past medical history of hypertension, hyperlipidemia, diabetes, CAD status post CABG, CHF with cardiac contractility modulator, and atrial fibrillation who presents to the ED complaining of hypotension.  Patient reports that he went to his PCP for follow-up earlier today and was found to have a blood pressure in the 70s systolic, was subsequently sent to the ED for further evaluation.  Patient reports that he has been feeling generally weak for about 2 weeks now with decreased oral intake.  Wife attributes this to being started on Ozempic, which was subsequently stopped shortly after the onset of symptoms.  Wife states that his oral intake has been gradually improving but is still worse than usual.  He has been taking Zofran as needed for nausea with improvement, denies any vomiting or diarrhea.  He has not had any fevers, cough, chest pain, shortness of breath, or dysuria.     Physical Exam   Triage Vital Signs: ED Triage Vitals  Encounter Vitals Group     BP 04/16/23 1141 127/73     Systolic BP Percentile --      Diastolic BP Percentile --      Pulse Rate 04/16/23 1141 69     Resp 04/16/23 1141 18     Temp 04/16/23 1141 (!) 97.5 F (36.4 C)     Temp Source 04/16/23 1814 Oral     SpO2 04/16/23 1141 100 %     Weight 04/16/23 1141 167 lb (75.8 kg)     Height 04/16/23 1141 5\' 5"  (1.651 m)     Head Circumference --      Peak Flow --      Pain Score 04/16/23 1141 0     Pain Loc --      Pain Education --      Exclude from Growth Chart --     Most recent vital signs: Vitals:   04/16/23 2230 04/16/23 2300  BP: 113/69 95/63  Pulse: 72 71  Resp:  18  Temp:    SpO2:  100%    Constitutional: Alert and oriented. Eyes: Conjunctivae are normal. Head:  Atraumatic. Nose: No congestion/rhinnorhea. Mouth/Throat: Mucous membranes are moist.  Cardiovascular: Normal rate, regular rhythm. Grossly normal heart sounds.  2+ radial pulses bilaterally. Respiratory: Normal respiratory effort.  No retractions. Lungs CTAB. Gastrointestinal: Soft and nontender. No distention. Musculoskeletal: No lower extremity tenderness nor edema.  Neurologic:  Normal speech and language. No gross focal neurologic deficits are appreciated.    ED Results / Procedures / Treatments   Labs (all labs ordered are listed, but only abnormal results are displayed) Labs Reviewed  BASIC METABOLIC PANEL - Abnormal; Notable for the following components:      Result Value   Glucose, Bld 148 (*)    BUN 26 (*)    Creatinine, Ser 1.53 (*)    GFR, Estimated 47 (*)    All other components within normal limits  CBC - Abnormal; Notable for the following components:   RBC 3.89 (*)    Hemoglobin 12.7 (*)    HCT 36.7 (*)    All other components within normal limits  URINALYSIS, ROUTINE W REFLEX MICROSCOPIC  CBG MONITORING, ED     EKG  ED ECG REPORT I,  Chesley Noon, the attending physician, personally viewed and interpreted this ECG.   Date: 04/16/2023  EKG Time: 11:44  Rate: 72  Rhythm: Normal sinus rhythm  Axis: Normal  Intervals:right bundle branch block  ST&T Change: None  PROCEDURES:  Critical Care performed: No  Procedures   MEDICATIONS ORDERED IN ED: Medications - No data to display   IMPRESSION / MDM / ASSESSMENT AND PLAN / ED COURSE  I reviewed the triage vital signs and the nursing notes.                              72 y.o. male with a past medical history of hypertension, hyperlipidemia, diabetes, CAD status post CABG, CHF, and atrial fibrillation who presents to the ED complaining of generalized weakness and hypotension noted at his PCPs office.  Patient's presentation is most consistent with acute presentation with potential threat to life  or bodily function.  Differential diagnosis includes, but is not limited to, sepsis, cardiogenic shock, dehydration, orthostatic hypotension, arrhythmia, electrolyte abnormality, AKI, anemia.  Patient nontoxic-appearing and in no acute distress, vital signs here in the ED are significant for borderline hypotension, however once patient placed in a room I checked his blood pressure multiple times and found him to be normotensive at 111/78 followed by 95/63.  Labs show no significant anemia, leukocytosis, electrolyte abnormality, or AKI.  EKG shows no evidence of arrhythmia or ischemia.  Patient's generalized weakness recently and appetite seem to be improving, do suspect dehydration playing a role in his low blood pressure seen at PCPs office.  Given his advanced heart failure, do not feel IV fluid bolus would be beneficial given he is now normotensive.  Patient and wife are eager to be discharged home, which seems reasonable given reassuring workup.  He was counseled to follow-up with his cardiologist and make sure that he drinks about 64 ounces of fluid each day, which has been recommended by cardiology.  He was counseled to return to the ED for new or worsening symptoms.  Patient and spouse agree with plan.      FINAL CLINICAL IMPRESSION(S) / ED DIAGNOSES   Final diagnoses:  Hypotension, unspecified hypotension type  Decreased oral intake     Rx / DC Orders   ED Discharge Orders          Ordered    ondansetron (ZOFRAN-ODT) 4 MG disintegrating tablet  Every 8 hours PRN        04/16/23 2313             Note:  This document was prepared using Dragon voice recognition software and may include unintentional dictation errors.   Chesley Noon, MD 04/16/23 [phone]

## 2023-04-16 NOTE — ED Provider Triage Note (Signed)
Emergency Medicine Provider Triage Evaluation Note  Jermaine Kramer , a 72 y.o. male  was evaluated in triage.  Pt complains of hypotension 70/40 at pcp. Reports generalized weakness, BP normal now.  Review of Systems  Positive: weakness Negative:   Physical Exam  BP 127/73   Pulse 69   Temp (!) 97.5 F (36.4 C)   Resp 18   Ht 5\' 5"  (1.651 m)   Wt 75.8 kg   SpO2 100%   BMI 27.79 kg/m  Gen:   Awake, no distress   Resp:  Normal effort  MSK:   Moves extremities without difficulty  Other:    Medical Decision Making  Medically screening exam initiated at 11:42 AM.  Appropriate orders placed.  Jermaine Kramer was informed that the remainder of the evaluation will be completed by another provider, this initial triage assessment does not replace that evaluation, and the importance of remaining in the ED until their evaluation is complete.     Cameron Ali, PA-C 04/16/23 1143

## 2023-04-16 NOTE — Telephone Encounter (Addendum)
Received call from patient's wife who states that patients BP is low this morning at [facility] primary care pharmacy- appointment- per patients wife BP is 70/46 and was checked twice.   Per patients wife- patient feels fine, denies lightheadedness/shortness of breath/ or any other symptoms   Has not had any cardiac medications this morning- has only had metformin   Per Inetta Fermo, NP- patient will need to report to ER- patient and patient wife aware

## 2023-04-16 NOTE — ED Triage Notes (Signed)
Patient to ED via POV for hypotension. States it was 70/46 at PCP. PT c/o weakness. Currently on ozempic- states not eating and drinking as much as normal .

## 2023-04-17 NOTE — Progress Notes (Signed)
Remote ICD transmission.   

## 2023-04-17 NOTE — Addendum Note (Signed)
Addended by: Geralyn Flash D on: 04/17/2023 03:07 PM   Modules accepted: Orders

## 2023-04-19 NOTE — Progress Notes (Signed)
PCP: Gardiner Coins, PA-C (last seen 12/24) Primary Cardiologist: Marcina Millard, MD/ Leanora Ivanoff, [place] (last seen 11/24) ADHF provider: Marca Ancona, MD (last seen 02/24)  Chief Complaint: Fatigue  HPI:  Jermaine Kramer is a 72 y/o male with a history of leukemia, DM, mesenteric panniculitis, CAD (CABG 02/16/21), AF, sleep apnea, hyperlipidemia, HTN, stroke, GERD & chronic heart failure. Had CCM generator with A/V lead insertion on 09/04/22.  Was in the ED 04/07/22 due to cough/ SOB. Admitted 06/09/22 due to LLQ abdominal pain. CT showed mesenteric pancolitis. Was in the ED 07/21/22 due to LUQ pain.Was in the ED 07/28/22 due to hyperglycemia. Was in the ED 12/22/22 due to left flank pain over the last 24 hours. Abdominal CT negative. Reassuring CBC with a normal white blood cell count, reassuring chemistry with normal LFTs. Normal lipase. Urinalysis shows no concerning findings.     Was in the ED 04/05/23 due to weakness thought to be due to ozempic use. Ozempic stopped. Was in the ED 04/16/23 due to hypotension. SBP in the 70's. Has had decreased fluid intake since ozempic usage which was stopped 2 weeks prior. Oral intake improving but still with nausea. Labs show no significant anemia, leukocytosis, electrolyte abnormality, or AKI. EKG shows no evidence of arrhythmia or ischemia.   Echo 05/16/21: EF <20% along with mild/moderate LAE and mild/moderate Jermaine.  Echo 09/16/21: EF 30-35% along with mild Jermaine. Echo 06/29/22: EF 25% with moderate Jermaine.    LHC 02/10/21:  2v CAD, including 100% midRCA, 70% midLAD with 80% focal distal LAD, 70% D1  Elevated LVEDP ( )   Cardiac MRI 01/27/21:  1. The left ventricle is mildly dilated.  Wall thickness is normal.     a.  The inferior wall is akinetic.     b.  The basal septum is severely hypokinetic.  The mid-distal septum is moderately hypokinetic.     c.  The basal anterior wall is severely hypokinetic. The mid-distal anterior wall and apex are  moderately  hypokinetic.     d.  The lateral wall is moderately. hypokinetic.   Global LV systolic function is severely reduced with an LVEF calculated at 30%.    2. The right ventricle is normal in cavity size and wall thickness.  There is mild global hypokinesis with  mildly reduced RV systolic function.  The RVEF is calculated at 43%.   He presents today for a HF follow-up visit with a chief complaint of moderate fatigue with minimal exertion. Chronic in nature although he does feel like it is slowly improving. Has shortness of breath and difficulty sleeping due to waking up frequently at night to urinate. Denies chest pain, cough, palpitations, abdominal distention, pedal edema, dizziness or weight gain. He is unable to wear cpap due to severe dry mouth.  Developed quite a bit of weakness/ nausea with ozempic so it was subsequently stopped. Says that he feels better and is slowly improving although is not back to his baseline yet. In the last week, home SBP ranging from 76-107 with home DBP ranging from 57-70  ROS: All systems negative except as listed in HPI, PMH and Problem List.  SH:  Social History   Socioeconomic History   Marital status: Married    Spouse name: Not on file   Number of children: Not on file   Years of education: Not on file   Highest education level: Not on file  Occupational History   Not on file  Tobacco Use   Smoking  status: Never   Smokeless tobacco: Never  Vaping Use   Vaping status: Never Used  Substance and Sexual Activity   Alcohol use: Not Currently   Drug use: No   Sexual activity: Not Currently  Other Topics Concern   Not on file  Social History Narrative   Not on file   Social Drivers of Health   Financial Resource Strain: Low Risk  (10/03/2022)   Received from [facility], [facility]   Overall Financial Resource Strain (CARDIA)    Difficulty of Paying Living Expenses: Not hard at all  Food Insecurity: No Food  Insecurity (10/03/2022)   Received from [facility], [facility]   Hunger Vital Sign    Worried About Running Out of Food in the Last Year: Never true    Ran Out of Food in the Last Year: Never true  Transportation Needs: No Transportation Needs (10/03/2022)   Received from [facility], [facility]   [facility] - Transportation    In the past 12 months, has lack of transportation kept you from medical appointments or from getting medications?: No    Lack of Transportation (Non-Medical): No  Physical Activity: Not on file  Stress: Not on file  Social Connections: Not on file  Intimate Partner Violence: Not At Risk (06/10/2022)   Humiliation, Afraid, Rape, and Kick questionnaire    Fear of Current or Ex-Partner: No    Emotionally Abused: No    Physically Abused: No    Sexually Abused: No    FH:  Family History  Problem Relation Age of Onset   Parkinson's disease Mother    Lupus Mother    Heart disease Father     Past Medical History:  Diagnosis Date   Arrhythmia    atrial fibrillation   CHF (congestive heart failure) (HCC)    Coronary artery disease    Diabetes mellitus without complication (HCC)    GERD (gastroesophageal reflux disease)    Hypercholesteremia    Hypertension    Sleep apnea    Stroke [facility])     Current Outpatient Medications  Medication Sig Dispense Refill   apixaban (ELIQUIS) 5 MG TABS tablet Take 1 tablet (5 mg total) by mouth 2 (two) times daily. 180 tablet 3   atorvastatin (LIPITOR) 80 MG tablet Take 1 tablet (80 mg total) by mouth daily. 30 tablet 5   losartan (COZAAR) 25 MG tablet TAKE 1/2 TABLET BY MOUTH DAILY 45 tablet 2   metFORMIN (GLUCOPHAGE) 500 MG tablet Take 500 mg by mouth 2 (two) times daily.     metoprolol succinate (TOPROL-XL) 25 MG 24 hr tablet TAKE 0.5 TABLETS BY MOUTH EVERY EVENING. (Patient taking differently: Take 12.5 mg by mouth daily.) 45 tablet 3   omeprazole  (PRILOSEC) 40 MG capsule Take 40 mg by mouth daily.     ondansetron (ZOFRAN-ODT) 4 MG disintegrating tablet Take 1 tablet (4 mg total) by mouth every 8 (eight) hours as needed for nausea or vomiting. 12 tablet 0   oxyCODONE (ROXICODONE) 5 MG immediate release tablet Take 1 tablet (5 mg total) by mouth every 8 (eight) hours as needed. (Patient not taking: Reported on 01/23/2023) 12 tablet 0   Semaglutide,0.25 or 0.5MG /DOS, (OZEMPIC, 0.25 OR 0.5 MG/DOSE,) 2 MG/1.5ML SOPN Inject 2 mg into the skin once a week.     spironolactone (ALDACTONE) 25 MG tablet TAKE 1 TABLET (25 MG TOTAL) BY MOUTH DAILY. (Patient taking differently: Take  12.5 mg by mouth daily.) 90 tablet 3   tamsulosin (FLOMAX) 0.4 MG CAPS capsule Take 0.4 mg by mouth in the morning and at bedtime.     torsemide (DEMADEX) 20 MG tablet TAKE 2 TABLETS (40 MG TOTAL) BY MOUTH DAILY. AND ADDITIONAL 20MG  IF NEEDED 180 tablet 1   No current facility-administered medications for this visit.   Vitals:   04/20/23 1109  BP: (!) 97/59  Pulse: 77  SpO2: 100%  Weight: 177 lb (80.3 kg)   Wt Readings from Last 3 Encounters:  04/20/23 177 lb (80.3 kg)  04/16/23 167 lb (75.8 kg)  04/05/23 170 lb (77.1 kg)   Lab Results  Component Value Date   CREATININE 1.53 (H) 04/16/2023   CREATININE 1.97 (H) 04/05/2023   CREATININE 1.60 (H) 12/22/2022    PHYSICAL EXAM:  General:  Well appearing. No resp difficulty HEENT: normal Neck: supple. JVP flat. No lymphadenopathy or thryomegaly appreciated. Cor: PMI normal. Regular rate & rhythm. No rubs, gallops or murmurs. Lungs: clear Abdomen: soft, nontender, nondistended. No hepatosplenomegaly. No bruits or masses.  Extremities: no cyanosis, clubbing, rash, edema Neuro: alert & orientedx3, cranial nerves grossly intact. Moves all 4 extremities w/o difficulty. Affect pleasant.   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  - Echo 09/16/21: EF 30-35% along with mild Jermaine. - Echo 06/29/22: EF 25% with moderate Jermaine.   - LHC 02/10/21: 2v CAD, including 100% midRCA, 70% midLAD with 80% focal distal LAD, 70% D1  elevated LVEDP ( )  - Cardiac MRI 01/27/21: 1. The left ventricle is mildly dilated. Wall thickness is normal. The inferior wall is akinetic. The basal septum is severely hypokinetic. The mid-distal septum is moderately hypokinetic. The basal anterior wall is severely hypokinetic. The mid-distal anterior wall and apex are moderately hypokinetic. The lateral wall is moderately. hypokinetic. 2. The right ventricle is normal in cavity size and wall thickness. There is mild global hypokinesis with mildly reduced RV systolic function. The RVEF is calculated at 43%.  - not adding salt and had been reading food labels for sodium content  - continue losartan 12.5mg  daily - continue metoprolol succinate 12.5mg  daily - continue spironolactone to 12.5mg  daily  - decrease torsemide to 20mg  daily with additional 20mg  if needed for weight gain, swelling/ SOB - allergic to empagliflozin with a yeast infection - saw ADHF provider Jearld Pies) 02/24 - saw EP Lalla Brothers) 03/24 - cardiac contractility modulator placed 05/24  - had AICD implanted 06/22/21; no shocks have been delivered - BNP 07/21/22 was 837.9  2: HTN- - BP 97/59; decreasing torsemide per above - saw PCP Lowell Guitar) 12/24 - BMP 04/16/23 showed sodium 135, potassium 4.4, creatinine 1.53 & GFR 47  3: DM with CKD- - A1c 04/16/23 was 7.1% - unable to tolerate ozempic due to weakness/ nausea - on glipizide 2.5mg  daily - saw nephrology Cherylann Ratel) 11/24  4: Atrial fibrillation- - saw EP Lalla Brothers)  08/24 - continue metoprolol succinate 12.5mg  daily - continue apixaban 5mg  BID  -if he has recurrent AF, would favor ablation. 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  5: CAD- - saw cardiology Mellissa Kohut) 11/24 - CABG with a LIMA to LAD, SVG to ramus, and SVG to PDA on 02/16/21 - continue atorvastatin 80 mg daily.  - LDL 08/03/21 was 42; repeat LDL next visit - no ASA given stable CAD and apixaban use  6: Mesenteric panniculitis- - saw GI Norma Fredrickson) 03/24   Return in 2 months, sooner if needed.

## 2023-04-20 ENCOUNTER — Ambulatory Visit: Payer: Medicare Other | Attending: Family | Admitting: Family

## 2023-04-20 ENCOUNTER — Encounter: Payer: Self-pay | Admitting: Family

## 2023-04-20 VITALS — BP 97/59 | HR 77 | Wt 177.0 lb

## 2023-04-20 DIAGNOSIS — K219 Gastro-esophageal reflux disease without esophagitis: Secondary | ICD-10-CM | POA: Diagnosis not present

## 2023-04-20 DIAGNOSIS — Z7901 Long term (current) use of anticoagulants: Secondary | ICD-10-CM | POA: Insufficient documentation

## 2023-04-20 DIAGNOSIS — I4891 Unspecified atrial fibrillation: Secondary | ICD-10-CM | POA: Diagnosis not present

## 2023-04-20 DIAGNOSIS — E1165 Type 2 diabetes mellitus with hyperglycemia: Secondary | ICD-10-CM | POA: Diagnosis not present

## 2023-04-20 DIAGNOSIS — I10 Essential (primary) hypertension: Secondary | ICD-10-CM

## 2023-04-20 DIAGNOSIS — R531 Weakness: Secondary | ICD-10-CM | POA: Insufficient documentation

## 2023-04-20 DIAGNOSIS — I130 Hypertensive heart and chronic kidney disease with heart failure and stage 1 through stage 4 chronic kidney disease, or unspecified chronic kidney disease: Secondary | ICD-10-CM | POA: Diagnosis present

## 2023-04-20 DIAGNOSIS — E1122 Type 2 diabetes mellitus with diabetic chronic kidney disease: Secondary | ICD-10-CM | POA: Insufficient documentation

## 2023-04-20 DIAGNOSIS — Z95810 Presence of automatic (implantable) cardiac defibrillator: Secondary | ICD-10-CM | POA: Insufficient documentation

## 2023-04-20 DIAGNOSIS — Z79899 Other long term (current) drug therapy: Secondary | ICD-10-CM | POA: Diagnosis not present

## 2023-04-20 DIAGNOSIS — Z951 Presence of aortocoronary bypass graft: Secondary | ICD-10-CM | POA: Diagnosis not present

## 2023-04-20 DIAGNOSIS — I2510 Atherosclerotic heart disease of native coronary artery without angina pectoris: Secondary | ICD-10-CM | POA: Diagnosis not present

## 2023-04-20 DIAGNOSIS — Z7985 Long-term (current) use of injectable non-insulin antidiabetic drugs: Secondary | ICD-10-CM | POA: Diagnosis not present

## 2023-04-20 DIAGNOSIS — I5022 Chronic systolic (congestive) heart failure: Secondary | ICD-10-CM | POA: Insufficient documentation

## 2023-04-20 DIAGNOSIS — G4730 Sleep apnea, unspecified: Secondary | ICD-10-CM | POA: Diagnosis not present

## 2023-04-20 DIAGNOSIS — R0602 Shortness of breath: Secondary | ICD-10-CM | POA: Diagnosis not present

## 2023-04-20 DIAGNOSIS — N189 Chronic kidney disease, unspecified: Secondary | ICD-10-CM | POA: Insufficient documentation

## 2023-04-20 DIAGNOSIS — K654 Sclerosing mesenteritis: Secondary | ICD-10-CM | POA: Insufficient documentation

## 2023-04-20 DIAGNOSIS — I480 Paroxysmal atrial fibrillation: Secondary | ICD-10-CM

## 2023-04-20 DIAGNOSIS — N1832 Chronic kidney disease, stage 3b: Secondary | ICD-10-CM

## 2023-04-20 DIAGNOSIS — I255 Ischemic cardiomyopathy: Secondary | ICD-10-CM | POA: Diagnosis not present

## 2023-04-20 DIAGNOSIS — Z8673 Personal history of transient ischemic attack (TIA), and cerebral infarction without residual deficits: Secondary | ICD-10-CM | POA: Insufficient documentation

## 2023-04-20 MED ORDER — TORSEMIDE 20 MG PO TABS: 20.0000 mg | ORAL_TABLET | Freq: Every day | ORAL | Status: DC

## 2023-04-20 NOTE — Patient Instructions (Addendum)
DECREASE TORSEMIDE TO 20 MG ONCE DAILY

## 2023-05-15 ENCOUNTER — Encounter: Payer: Medicare Other | Admitting: Family

## 2023-05-21 ENCOUNTER — Other Ambulatory Visit: Payer: Self-pay | Admitting: Family

## 2023-06-20 ENCOUNTER — Ambulatory Visit (INDEPENDENT_AMBULATORY_CARE_PROVIDER_SITE_OTHER): Payer: Medicare Other

## 2023-06-20 DIAGNOSIS — I255 Ischemic cardiomyopathy: Secondary | ICD-10-CM

## 2023-06-20 LAB — CUP PACEART REMOTE DEVICE CHECK
Battery Remaining Longevity: 98 mo
Battery Voltage: 3.01 V
Brady Statistic AP VP Percent: 0.06 %
Brady Statistic AP VS Percent: 16.75 %
Brady Statistic AS VP Percent: 0.03 %
Brady Statistic AS VS Percent: 83.17 %
Brady Statistic RA Percent Paced: 16.43 %
Brady Statistic RV Percent Paced: 0.08 %
Date Time Interrogation Session: 20250219012504
HighPow Impedance: 56 Ohm
Implantable Lead Connection Status: 753985
Implantable Lead Connection Status: 753985
Implantable Lead Implant Date: 20230222
Implantable Lead Implant Date: 20230222
Implantable Lead Location: 753859
Implantable Lead Location: 753860
Implantable Lead Model: 5076
Implantable Pulse Generator Implant Date: 20230222
Lead Channel Impedance Value: 456 Ohm
Lead Channel Impedance Value: 456 Ohm
Lead Channel Impedance Value: 532 Ohm
Lead Channel Pacing Threshold Amplitude: 0.625 V
Lead Channel Pacing Threshold Amplitude: 0.75 V
Lead Channel Pacing Threshold Pulse Width: 0.4 ms
Lead Channel Pacing Threshold Pulse Width: 0.4 ms
Lead Channel Sensing Intrinsic Amplitude: 1.25 mV
Lead Channel Sensing Intrinsic Amplitude: 1.25 mV
Lead Channel Sensing Intrinsic Amplitude: 9.375 mV
Lead Channel Sensing Intrinsic Amplitude: 9.375 mV
Lead Channel Setting Pacing Amplitude: 1.5 V
Lead Channel Setting Pacing Amplitude: 1.5 V
Lead Channel Setting Pacing Pulse Width: 0.4 ms
Lead Channel Setting Sensing Sensitivity: 0.3 mV
Zone Setting Status: 755011
Zone Setting Status: 755011

## 2023-06-21 ENCOUNTER — Telehealth: Payer: Self-pay | Admitting: Family

## 2023-06-21 LAB — CUP PACEART REMOTE DEVICE CHECK
Battery Remaining Longevity: 98 mo
Battery Voltage: 3.01 V
Brady Statistic AP VP Percent: 0.06 %
Brady Statistic AP VS Percent: 16.75 %
Brady Statistic AS VP Percent: 0.03 %
Brady Statistic AS VS Percent: 83.17 %
Brady Statistic RA Percent Paced: 16.43 %
Brady Statistic RV Percent Paced: 0.08 %
Date Time Interrogation Session: 20250219012504
HighPow Impedance: 56 Ohm
Implantable Lead Connection Status: 753985
Implantable Lead Connection Status: 753985
Implantable Lead Implant Date: 20230222
Implantable Lead Implant Date: 20230222
Implantable Lead Location: 753859
Implantable Lead Location: 753860
Implantable Lead Model: 5076
Implantable Pulse Generator Implant Date: 20230222
Lead Channel Impedance Value: 456 Ohm
Lead Channel Impedance Value: 456 Ohm
Lead Channel Impedance Value: 532 Ohm
Lead Channel Pacing Threshold Amplitude: 0.625 V
Lead Channel Pacing Threshold Amplitude: 0.75 V
Lead Channel Pacing Threshold Pulse Width: 0.4 ms
Lead Channel Pacing Threshold Pulse Width: 0.4 ms
Lead Channel Sensing Intrinsic Amplitude: 1.25 mV
Lead Channel Sensing Intrinsic Amplitude: 1.25 mV
Lead Channel Sensing Intrinsic Amplitude: 9.375 mV
Lead Channel Sensing Intrinsic Amplitude: 9.375 mV
Lead Channel Setting Pacing Amplitude: 1.5 V
Lead Channel Setting Pacing Amplitude: 1.5 V
Lead Channel Setting Pacing Pulse Width: 0.4 ms
Lead Channel Setting Sensing Sensitivity: 0.3 mV
Zone Setting Status: 755011
Zone Setting Status: 755011

## 2023-06-21 NOTE — Progress Notes (Unsigned)
Advanced [facility] Failure Clinic Note    PCP: Gardiner Coins, PA-C (last seen 01/25) Primary Cardiologist: Marcina Millard, MD/ Leanora Ivanoff, [place] (last seen 11/24) ADHF provider: Marca Ancona, MD (last seen 02/24)  Chief Complaint:  HPI:  Jermaine Kramer is a 73 y/o male with a history of leukemia, DM, mesenteric panniculitis, CAD (CABG 02/16/21), AF, sleep apnea, hyperlipidemia, HTN, stroke, GERD & chronic heart failure. Had CCM generator with A/V lead insertion on 09/04/22.  Was in the ED 04/07/22 due to cough/ SOB. Admitted 06/09/22 due to LLQ abdominal pain. CT showed mesenteric pancolitis. Was in the ED 07/21/22 due to LUQ pain.Was in the ED 07/28/22 due to hyperglycemia. Was in the ED 12/22/22 due to left flank pain over the last 24 hours. Abdominal CT negative. Reassuring CBC with a normal white blood cell count, reassuring chemistry with normal LFTs. Normal lipase. Urinalysis shows no concerning findings.     Was in the ED 04/05/23 due to weakness thought to be due to ozempic use. Ozempic stopped. Was in the ED 04/16/23 due to hypotension. SBP in the 70's. Has had decreased fluid intake since ozempic usage which was stopped 2 weeks prior. Oral intake improving but still with nausea. Labs show no significant anemia, leukocytosis, electrolyte abnormality, or AKI. EKG shows no evidence of arrhythmia or ischemia.   Echo 05/16/21: EF <20% along with mild/moderate LAE and mild/moderate Jermaine.  Echo 09/16/21: EF 30-35% along with mild Jermaine. Echo 06/29/22: EF 25% with moderate Jermaine.    LHC 02/10/21:  2v CAD, including 100% midRCA, 70% midLAD with 80% focal distal LAD, 70% D1  Elevated LVEDP ( )   Cardiac MRI 01/27/21:  1. The left ventricle is mildly dilated.  Wall thickness is normal.     a.  The inferior wall is akinetic.     b.  The basal septum is severely hypokinetic.  The mid-distal septum is moderately hypokinetic.     c.  The basal anterior wall is severely hypokinetic. The mid-distal  anterior wall and apex are  moderately hypokinetic.     d.  The lateral wall is moderately. hypokinetic.   Global LV systolic function is severely reduced with an LVEF calculated at 30%.    2. The right ventricle is normal in cavity size and wall thickness.  There is mild global hypokinesis with  mildly reduced RV systolic function.  The RVEF is calculated at 43%.   He presents today for a HF follow-up visit with a chief complaint of    At last visit, torsemide was decreased to 20mg  daily with 20mg  PRN due to hypotension.   ROS: All systems negative except as listed in HPI, PMH and Problem List.  SH:  Social History   Socioeconomic History   Marital status: Married    Spouse name: Not on file   Number of children: Not on file   Years of education: Not on file   Highest education level: Not on file  Occupational History   Not on file  Tobacco Use   Smoking status: Never   Smokeless tobacco: Never  Vaping Use   Vaping status: Never Used  Substance and Sexual Activity   Alcohol use: Not Currently   Drug use: No   Sexual activity: Not Currently  Other Topics Concern   Not on file  Social History Narrative   Not on file   Social Drivers of Health   Financial Resource Strain: Low Risk  (10/03/2022)   Received from [facility], [facility]   Overall Financial Resource Strain (CARDIA)    Difficulty of Paying Living Expenses: Not hard at all  Food Insecurity: No Food Insecurity (10/03/2022)   Received from [facility], [facility]   Hunger Vital Sign    Worried About Running Out of Food in the Last Year: Never true    Ran Out of Food in the Last Year: Never true  Transportation Needs: No Transportation Needs (10/03/2022)   Received from [facility], [facility]   [facility] - Transportation    In the past 12 months, has lack of transportation kept you from medical  appointments or from getting medications?: No    Lack of Transportation (Non-Medical): No  Physical Activity: Not on file  Stress: Not on file  Social Connections: Not on file  Intimate Partner Violence: Not At Risk (06/10/2022)   Humiliation, Afraid, Rape, and Kick questionnaire    Fear of Current or Ex-Partner: No    Emotionally Abused: No    Physically Abused: No    Sexually Abused: No    FH:  Family History  Problem Relation Age of Onset   Parkinson's disease Mother    Lupus Mother    Heart disease Father     Past Medical History:  Diagnosis Date   Arrhythmia    atrial fibrillation   CHF (congestive heart failure) (HCC)    Coronary artery disease    Diabetes mellitus without complication (HCC)    GERD (gastroesophageal reflux disease)    Hypercholesteremia    Hypertension    Sleep apnea    Stroke [facility])     Current Outpatient Medications  Medication Sig Dispense Refill   apixaban (ELIQUIS) 5 MG TABS tablet Take 1 tablet (5 mg total) by mouth 2 (two) times daily. 180 tablet 3   atorvastatin (LIPITOR) 80 MG tablet Take 1 tablet (80 mg total) by mouth daily. 30 tablet 5   glipiZIDE (GLUCOTROL XL) 2.5 MG 24 hr tablet Take 2.5 mg by mouth daily with breakfast.     losartan (COZAAR) 25 MG tablet TAKE 1/2 TABLET BY MOUTH DAILY 45 tablet 2   metFORMIN (GLUCOPHAGE) 500 MG tablet Take 500 mg by mouth 2 (two) times daily.     metoprolol succinate (TOPROL-XL) 25 MG 24 hr tablet TAKE 0.5 TABLETS BY MOUTH EVERY EVENING. (Patient taking differently: Take 12.5 mg by mouth daily.) 45 tablet 3   omeprazole (PRILOSEC) 40 MG capsule Take 40 mg by mouth daily.     ondansetron (ZOFRAN-ODT) 4 MG disintegrating tablet Take 1 tablet (4 mg total) by mouth every 8 (eight) hours as needed for nausea or vomiting. 12 tablet 0   oxyCODONE (ROXICODONE) 5 MG immediate release tablet Take 1 tablet (5 mg total) by mouth every 8 (eight) hours as needed. 12 tablet 0   spironolactone (ALDACTONE) 25 MG  tablet TAKE 1 TABLET (25 MG TOTAL) BY MOUTH DAILY. (Patient taking differently: Take 12.5 mg by mouth daily.) 90 tablet 3   tamsulosin (FLOMAX) 0.4 MG CAPS capsule Take 0.4 mg by mouth in the morning and at bedtime.     torsemide (DEMADEX) 20 MG tablet TAKE 1 TABLET (20 MG TOTAL) BY MOUTH DAILY. AND ADDITIONAL 20MG  IF NEEDED 120 tablet 1   No current facility-administered medications for this visit.      PHYSICAL EXAM:  General:  Well appearing. No resp difficulty HEENT: normal Neck: supple. JVP flat. No lymphadenopathy or thryomegaly appreciated.  Cor: PMI normal. Regular rate & rhythm. No rubs, gallops or murmurs. Lungs: clear Abdomen: soft, nontender, nondistended. No hepatosplenomegaly. No bruits or masses.  Extremities: no cyanosis, clubbing, rash, edema Neuro: alert & orientedx3, cranial nerves grossly intact. Moves all 4 extremities w/o difficulty. Affect pleasant.   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  - Echo 09/16/21: EF 30-35% along with mild Jermaine. - Echo 06/29/22: EF 25% with moderate Jermaine.   - LHC 02/10/21: 2v CAD, including 100% midRCA, 70% midLAD with 80% focal distal LAD, 70% D1  elevated LVEDP ( )  - Cardiac MRI 01/27/21: 1. The left ventricle is mildly dilated. Wall thickness is normal. The inferior wall is akinetic. The basal septum is severely hypokinetic. The mid-distal septum is moderately hypokinetic. The basal anterior wall is severely hypokinetic. The mid-distal anterior wall and apex are moderately hypokinetic. The lateral wall is moderately. hypokinetic. 2. The right ventricle is normal in cavity size and  wall thickness. There is mild global hypokinesis with mildly reduced RV systolic function. The RVEF is calculated at 43%.  - not adding salt and had been reading food labels for sodium content  - continue losartan 12.5mg  daily - continue metoprolol succinate 12.5mg  daily - continue spironolactone to 12.5mg  daily  - continue torsemide 20mg  daily with additional 20mg  if needed for weight gain, swelling/ SOB - allergic to empagliflozin with a yeast infection - saw ADHF provider Jearld Pies) 02/24 - saw EP Lalla Brothers) 03/24 - cardiac contractility modulator placed 05/24  - had AICD implanted 06/22/21; no shocks have been delivered - BNP 07/21/22 was 837.9  2: HTN- - BP  - saw PCP (Olmedo) 01/25 - BMP 04/16/23 reviewed and showed sodium 135, potassium 4.4, creatinine 1.53 & GFR 47  3: DM with CKD- - A1c 04/16/23 reviewed and was 7.1% - unable to tolerate ozempic due to weakness/ nausea - continue glipizide 2.5mg  daily - saw nephrology Cherylann Ratel) 11/24  4: Atrial fibrillation- - saw EP Lalla Brothers) 08/24 - continue metoprolol succinate 12.5mg  daily - continue apixaban 5mg  BID  -if he has recurrent AF, would favor ablation. 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   5: CAD- - saw cardiology Mellissa Kohut) 11/24 - CABG with a LIMA to LAD, SVG to ramus, and SVG to PDA on 02/16/21 - continue atorvastatin 80 mg daily.  - LDL 08/03/21 was 42; repeat LDL next visit - no ASA given stable CAD and apixaban use  6: Mesenteric panniculitis- - saw GI [facility]) 03/24      Delma Freeze, FNP 06/21/23

## 2023-06-21 NOTE — Telephone Encounter (Signed)
Pt confirmed appt for 06/22/23

## 2023-06-22 ENCOUNTER — Encounter: Payer: Self-pay | Admitting: Family

## 2023-06-22 ENCOUNTER — Encounter: Payer: Self-pay | Admitting: Cardiology

## 2023-06-22 ENCOUNTER — Ambulatory Visit: Payer: Medicare Other | Attending: Family | Admitting: Family

## 2023-06-22 VITALS — BP 104/63 | HR 96 | Wt 188.0 lb

## 2023-06-22 DIAGNOSIS — Z8673 Personal history of transient ischemic attack (TIA), and cerebral infarction without residual deficits: Secondary | ICD-10-CM | POA: Insufficient documentation

## 2023-06-22 DIAGNOSIS — Z951 Presence of aortocoronary bypass graft: Secondary | ICD-10-CM | POA: Diagnosis not present

## 2023-06-22 DIAGNOSIS — Z95810 Presence of automatic (implantable) cardiac defibrillator: Secondary | ICD-10-CM | POA: Insufficient documentation

## 2023-06-22 DIAGNOSIS — R0602 Shortness of breath: Secondary | ICD-10-CM | POA: Insufficient documentation

## 2023-06-22 DIAGNOSIS — I4891 Unspecified atrial fibrillation: Secondary | ICD-10-CM | POA: Insufficient documentation

## 2023-06-22 DIAGNOSIS — E1122 Type 2 diabetes mellitus with diabetic chronic kidney disease: Secondary | ICD-10-CM | POA: Diagnosis not present

## 2023-06-22 DIAGNOSIS — I255 Ischemic cardiomyopathy: Secondary | ICD-10-CM | POA: Diagnosis not present

## 2023-06-22 DIAGNOSIS — Z79899 Other long term (current) drug therapy: Secondary | ICD-10-CM | POA: Diagnosis not present

## 2023-06-22 DIAGNOSIS — Z7901 Long term (current) use of anticoagulants: Secondary | ICD-10-CM | POA: Diagnosis not present

## 2023-06-22 DIAGNOSIS — I2510 Atherosclerotic heart disease of native coronary artery without angina pectoris: Secondary | ICD-10-CM | POA: Insufficient documentation

## 2023-06-22 DIAGNOSIS — I130 Hypertensive heart and chronic kidney disease with heart failure and stage 1 through stage 4 chronic kidney disease, or unspecified chronic kidney disease: Secondary | ICD-10-CM | POA: Diagnosis present

## 2023-06-22 DIAGNOSIS — I480 Paroxysmal atrial fibrillation: Secondary | ICD-10-CM | POA: Diagnosis not present

## 2023-06-22 DIAGNOSIS — I5022 Chronic systolic (congestive) heart failure: Secondary | ICD-10-CM | POA: Diagnosis not present

## 2023-06-22 DIAGNOSIS — G4730 Sleep apnea, unspecified: Secondary | ICD-10-CM | POA: Diagnosis not present

## 2023-06-22 DIAGNOSIS — N189 Chronic kidney disease, unspecified: Secondary | ICD-10-CM | POA: Diagnosis not present

## 2023-06-22 DIAGNOSIS — I10 Essential (primary) hypertension: Secondary | ICD-10-CM | POA: Diagnosis not present

## 2023-06-22 DIAGNOSIS — K219 Gastro-esophageal reflux disease without esophagitis: Secondary | ICD-10-CM | POA: Insufficient documentation

## 2023-06-22 DIAGNOSIS — N1832 Chronic kidney disease, stage 3b: Secondary | ICD-10-CM

## 2023-06-22 DIAGNOSIS — Z856 Personal history of leukemia: Secondary | ICD-10-CM | POA: Diagnosis not present

## 2023-06-22 NOTE — Patient Instructions (Signed)
Good to see you today, keep up the good work.

## 2023-06-25 ENCOUNTER — Other Ambulatory Visit: Payer: Self-pay | Admitting: Family

## 2023-07-25 NOTE — Progress Notes (Signed)
 Remote ICD transmission.

## 2023-07-25 NOTE — Addendum Note (Signed)
 Addended by: Elease Etienne A on: 07/25/2023 11:46 AM   Modules accepted: Orders

## 2023-08-27 ENCOUNTER — Other Ambulatory Visit: Payer: Self-pay | Admitting: Family

## 2023-09-19 ENCOUNTER — Ambulatory Visit (INDEPENDENT_AMBULATORY_CARE_PROVIDER_SITE_OTHER): Payer: Medicare Other

## 2023-09-19 ENCOUNTER — Other Ambulatory Visit: Payer: Self-pay | Admitting: Family

## 2023-09-19 DIAGNOSIS — I255 Ischemic cardiomyopathy: Secondary | ICD-10-CM | POA: Diagnosis not present

## 2023-09-20 ENCOUNTER — Ambulatory Visit: Payer: Self-pay | Admitting: Cardiology

## 2023-09-20 LAB — CUP PACEART REMOTE DEVICE CHECK
Battery Remaining Longevity: 95 mo
Battery Voltage: 3 V
Brady Statistic AP VP Percent: 0.07 %
Brady Statistic AP VS Percent: 8.06 %
Brady Statistic AS VP Percent: 0.03 %
Brady Statistic AS VS Percent: 91.84 %
Brady Statistic RA Percent Paced: 8.02 %
Brady Statistic RV Percent Paced: 0.1 %
Date Time Interrogation Session: 20250521012302
HighPow Impedance: 60 Ohm
Implantable Lead Connection Status: 753985
Implantable Lead Connection Status: 753985
Implantable Lead Implant Date: 20230222
Implantable Lead Implant Date: 20230222
Implantable Lead Location: 753859
Implantable Lead Location: 753860
Implantable Lead Model: 5076
Implantable Pulse Generator Implant Date: 20230222
Lead Channel Impedance Value: 456 Ohm
Lead Channel Impedance Value: 475 Ohm
Lead Channel Impedance Value: 532 Ohm
Lead Channel Pacing Threshold Amplitude: 0.5 V
Lead Channel Pacing Threshold Amplitude: 0.625 V
Lead Channel Pacing Threshold Pulse Width: 0.4 ms
Lead Channel Pacing Threshold Pulse Width: 0.4 ms
Lead Channel Sensing Intrinsic Amplitude: 1.375 mV
Lead Channel Sensing Intrinsic Amplitude: 1.375 mV
Lead Channel Sensing Intrinsic Amplitude: 9 mV
Lead Channel Sensing Intrinsic Amplitude: 9 mV
Lead Channel Setting Pacing Amplitude: 1.5 V
Lead Channel Setting Pacing Amplitude: 1.5 V
Lead Channel Setting Pacing Pulse Width: 0.4 ms
Lead Channel Setting Sensing Sensitivity: 0.3 mV
Zone Setting Status: 755011
Zone Setting Status: 755011

## 2023-11-08 NOTE — Addendum Note (Signed)
 Addended by: VICCI SELLER A on: 11/08/2023 08:46 AM   Modules accepted: Orders

## 2023-11-08 NOTE — Progress Notes (Signed)
 Remote ICD transmission.

## 2023-11-13 ENCOUNTER — Other Ambulatory Visit: Payer: Self-pay | Admitting: Family

## 2023-11-13 ENCOUNTER — Other Ambulatory Visit: Payer: Self-pay | Admitting: Cardiology

## 2023-12-03 ENCOUNTER — Other Ambulatory Visit (HOSPITAL_COMMUNITY): Payer: Self-pay

## 2023-12-03 ENCOUNTER — Ambulatory Visit: Attending: Family | Admitting: Adult Health

## 2023-12-03 VITALS — BP 107/76 | HR 82

## 2023-12-03 DIAGNOSIS — E119 Type 2 diabetes mellitus without complications: Secondary | ICD-10-CM | POA: Insufficient documentation

## 2023-12-03 DIAGNOSIS — G4730 Sleep apnea, unspecified: Secondary | ICD-10-CM | POA: Insufficient documentation

## 2023-12-03 DIAGNOSIS — Z7984 Long term (current) use of oral hypoglycemic drugs: Secondary | ICD-10-CM | POA: Diagnosis not present

## 2023-12-03 DIAGNOSIS — I110 Hypertensive heart disease with heart failure: Secondary | ICD-10-CM | POA: Insufficient documentation

## 2023-12-03 DIAGNOSIS — Z7901 Long term (current) use of anticoagulants: Secondary | ICD-10-CM | POA: Insufficient documentation

## 2023-12-03 DIAGNOSIS — Z951 Presence of aortocoronary bypass graft: Secondary | ICD-10-CM | POA: Diagnosis not present

## 2023-12-03 DIAGNOSIS — I255 Ischemic cardiomyopathy: Secondary | ICD-10-CM | POA: Diagnosis not present

## 2023-12-03 DIAGNOSIS — Z856 Personal history of leukemia: Secondary | ICD-10-CM | POA: Diagnosis not present

## 2023-12-03 DIAGNOSIS — Z79899 Other long term (current) drug therapy: Secondary | ICD-10-CM | POA: Insufficient documentation

## 2023-12-03 DIAGNOSIS — R0602 Shortness of breath: Secondary | ICD-10-CM

## 2023-12-03 DIAGNOSIS — E785 Hyperlipidemia, unspecified: Secondary | ICD-10-CM | POA: Insufficient documentation

## 2023-12-03 DIAGNOSIS — I5023 Acute on chronic systolic (congestive) heart failure: Secondary | ICD-10-CM | POA: Diagnosis not present

## 2023-12-03 DIAGNOSIS — I480 Paroxysmal atrial fibrillation: Secondary | ICD-10-CM | POA: Diagnosis not present

## 2023-12-03 DIAGNOSIS — I4891 Unspecified atrial fibrillation: Secondary | ICD-10-CM | POA: Diagnosis not present

## 2023-12-03 DIAGNOSIS — I2510 Atherosclerotic heart disease of native coronary artery without angina pectoris: Secondary | ICD-10-CM

## 2023-12-03 DIAGNOSIS — K219 Gastro-esophageal reflux disease without esophagitis: Secondary | ICD-10-CM | POA: Diagnosis not present

## 2023-12-03 DIAGNOSIS — Z950 Presence of cardiac pacemaker: Secondary | ICD-10-CM | POA: Insufficient documentation

## 2023-12-03 DIAGNOSIS — Z8673 Personal history of transient ischemic attack (TIA), and cerebral infarction without residual deficits: Secondary | ICD-10-CM | POA: Diagnosis not present

## 2023-12-03 DIAGNOSIS — Z95810 Presence of automatic (implantable) cardiac defibrillator: Secondary | ICD-10-CM

## 2023-12-03 DIAGNOSIS — I509 Heart failure, unspecified: Secondary | ICD-10-CM | POA: Diagnosis present

## 2023-12-03 DIAGNOSIS — I5042 Chronic combined systolic (congestive) and diastolic (congestive) heart failure: Secondary | ICD-10-CM | POA: Diagnosis present

## 2023-12-03 MED ORDER — FUROSCIX 80 MG/10ML ~~LOC~~ CTKT: 80.0000 mg | CARTRIDGE | Freq: Every day | SUBCUTANEOUS | Status: DC

## 2023-12-03 NOTE — Patient Instructions (Signed)
 Medication Changes:  Monday, August 4th: Hold Torsemide . Take one furoscix  injection.  Tuesday, August 5th: Hold Torsemide . Take one furoscix  injection. Wednesday, August 6th: Resume Torsemide  at 40 mg daily. Friday, August 8th: Return for appointment with Ellouise Class and lab work.   Special Instructions // Education:  Do the following things EVERYDAY: Weigh yourself in the morning before breakfast. Write it down and keep it in a log. Take your medicines as prescribed Eat low salt foods--Limit salt (sodium) to 2000 mg per day.  Stay as active as you can everyday Limit all fluids for the day to less than 2 liters   Follow-Up in: 12/07/23 at 11:30    If you have any questions or concerns before your next appointment please send us  a message through [place] or call our office at [phone], If it is after office hours your call will be answered by our answering service and directed appropriately.     At the [facility], you and your health needs are our priority. We have a designated team specialized in the treatment of Heart Failure. This Care Team includes your primary Heart Failure Specialized Cardiologist (physician), Advanced Practice Providers (APPs- Physician Assistants and Nurse Practitioners), and Pharmacist who all work together to provide you with the care you need, when you need it.   You may see any of the following providers on your designated Care Team at your next follow up:  Dr. Toribio Fuel Dr. Ezra Shuck Dr. Ria Commander Dr. Odis Brownie Greig Mosses, NP Caffie Shed, [place] [address] [place], [place] Beckey Coe, NP [place] Lee, NP Ellouise Class, NP Jaun Bash, PharmD

## 2023-12-03 NOTE — Progress Notes (Signed)
 Advanced Heart Failure Clinic Note    PCP: Perri Constance Sor, PA-C (last seen 01/25) Primary Cardiologist: Ammon Blunt, MD/ Clarisa Kung, [place] (last seen 11/24) ADHF provider: Rolan Barrack, MD (last seen 02/24)  Chief Complaint: Heart Failure   HPI: Mr Greenley is a 73 y/o male with a history of leukemia, DM, mesenteric panniculitis, CAD (CABG 02/16/21), AF, sleep apnea, hyperlipidemia, HTN, stroke, GERD & chronic heart failure. Had CCM generator with A/V lead insertion on 09/04/22.  Was in the ED 04/07/22 due to cough/ SOB. Admitted 06/09/22 due to LLQ abdominal pain. CT showed mesenteric pancolitis. Was in the ED 07/21/22 due to LUQ pain.Was in the ED 07/28/22 due to hyperglycemia. Was in the ED 12/22/22 due to left flank pain over the last 24 hours. Abdominal CT negative. Reassuring CBC with a normal white blood cell count, reassuring chemistry with normal LFTs. Normal lipase. Urinalysis shows no concerning findings.     Was in the ED 04/05/23 due to weakness thought to be due to ozempic use. Ozempic stopped. Was in the ED 04/16/23 due to hypotension. SBP in the 70's. Has had decreased fluid intake since ozempic usage which was stopped 2 weeks prior. Oral intake improving but still with nausea. Labs show no significant anemia, leukocytosis, electrolyte abnormality, or AKI. EKG shows no evidence of arrhythmia or ischemia.   Last week was seen by Nephrology. Fluid overloaded. He was instructed to take 40 mg torsemide  x 6 days. Nephrology recommended follow up in the HF clinic.   Complaining fatigue. SOB with exertion. Denies PND/Orthopnea. Appetite poor. Likes watermelon but hasn't had any in 2 weeks. Complaining of  nausea. Says he is nauseated all the time after starting on Ozempic.  No fever or chills. Weight at home 180-186 pounds. Taking all medications.  Cardiac Test Echo 05/16/21: EF <20% along with mild/moderate LAE and mild/moderate MR.  Echo 09/16/21: EF 30-35% along with mild  MR. Echo 06/29/22: EF 25% with moderate MR.    LHC 02/10/21:  2v CAD, including 100% midRCA, 70% midLAD with 80% focal distal LAD, 70% D1  Elevated LVEDP ( )   Cardiac MRI 01/27/21:  1. The left ventricle is mildly dilated.  Wall thickness is normal.     a.  The inferior wall is akinetic.     b.  The basal septum is severely hypokinetic.  The mid-distal septum is moderately hypokinetic.     c.  The basal anterior wall is severely hypokinetic. The mid-distal anterior wall and apex are  moderately hypokinetic.     d.  The lateral wall is moderately. hypokinetic.   Global LV systolic function is severely reduced with an LVEF calculated at 30%.    2. The right ventricle is normal in cavity size and wall thickness.  There is mild global hypokinesis with  mildly reduced RV systolic function.  The RVEF is calculated at 43%.    ROS: All systems negative except as listed in HPI, PMH and Problem List.  SH:  Social History   Socioeconomic History   Marital status: Married    Spouse name: Not on file   Number of children: Not on file   Years of education: Not on file   Highest education level: Not on file  Occupational History   Not on file  Tobacco Use   Smoking status: Never   Smokeless tobacco: Never  Vaping Use   Vaping status: Never Used  Substance and Sexual Activity   Alcohol use: Not Currently   Drug  use: No   Sexual activity: Not Currently  Other Topics Concern   Not on file  Social History Narrative   Not on file   Social Drivers of Health   Financial Resource Strain: Low Risk  (10/24/2023)   Received from [facility]   Overall Financial Resource Strain (CARDIA)    Difficulty of Paying Living Expenses: Not hard at all  Food Insecurity: No Food Insecurity (10/24/2023)   Received from [facility]   Hunger Vital Sign    Within the past 12 months, you worried that your food would run out before you got the money to buy more.: Never  true    Within the past 12 months, the food you bought just didn't last and you didn't have money to get more.: Never true  Transportation Needs: No Transportation Needs (10/24/2023)   Received from [facility] - Transportation    In the past 12 months, has lack of transportation kept you from medical appointments or from getting medications?: No    Lack of Transportation (Non-Medical): No  Physical Activity: Not on file  Stress: Not on file  Social Connections: Not on file  Intimate Partner Violence: Not At Risk (06/10/2022)   Humiliation, Afraid, Rape, and Kick questionnaire    Fear of Current or Ex-Partner: No    Emotionally Abused: No    Physically Abused: No    Sexually Abused: No    FH:  Family History  Problem Relation Age of Onset   Parkinson's disease Mother    Lupus Mother    Heart disease Father     Past Medical History:  Diagnosis Date   Arrhythmia    atrial fibrillation   CHF (congestive heart failure) (HCC)    Coronary artery disease    Diabetes mellitus without complication (HCC)    GERD (gastroesophageal reflux disease)    Hypercholesteremia    Hypertension    Sleep apnea    Stroke [facility])     Current Outpatient Medications  Medication Sig Dispense Refill   apixaban  (ELIQUIS ) 5 MG TABS tablet Take 1 tablet (5 mg total) by mouth 2 (two) times daily. 180 tablet 3   atorvastatin  (LIPITOR ) 80 MG tablet Take 1 tablet (80 mg total) by mouth daily. 30 tablet 5   losartan  (COZAAR ) 25 MG tablet TAKE 1/2 TABLET BY MOUTH DAILY 45 tablet 2   metFORMIN  (GLUCOPHAGE ) 500 MG tablet Take 500 mg by mouth 2 (two) times daily.     metoprolol  succinate (TOPROL -XL) 25 MG 24 hr tablet TAKE 1/2 TABLET BY MOUTH EVERY EVENING 45 tablet 3   omeprazole (PRILOSEC) 40 MG capsule Take 40 mg by mouth daily.     ondansetron  (ZOFRAN -ODT) 4 MG disintegrating tablet Take 1 tablet (4 mg total) by mouth every 8 (eight) hours as needed for nausea or vomiting. 12  tablet 0   Semaglutide,0.25 or 0.5MG /DOS, 2 MG/3ML SOPN Inject 0.25 mg into the skin once a week.     spironolactone  (ALDACTONE ) 25 MG tablet TAKE 1 TABLET (25 MG TOTAL) BY MOUTH DAILY. 90 tablet 0   tamsulosin  (FLOMAX ) 0.4 MG CAPS capsule Take 0.4 mg by mouth in the morning and at bedtime.     torsemide  (DEMADEX ) 20 MG tablet TAKE 1 TABLET (20 MG TOTAL) BY MOUTH DAILY. AND ADDITIONAL 20MG  IF NEEDED 180 tablet 1   No current facility-administered medications for this visit.   Vitals:   12/03/23 0905  BP: 107/76  Pulse:  82  SpO2: 97%   Wt Readings from Last 3 Encounters:  06/22/23 188 lb (85.3 kg)  04/20/23 177 lb (80.3 kg)  04/16/23 167 lb (75.8 kg)   Lab Results  Component Value Date   CREATININE 1.53 (H) 04/16/2023   CREATININE 1.97 (H) 04/05/2023   CREATININE 1.60 (H) 12/22/2022    PHYSICAL EXAM: General:   No resp difficulty Neck: JVP 10-11  Cor: Regular rate & rhythm. Lungs: clear Abdomen: soft, nontender, distended.  Extremities: R and LLE 2+  edema Neuro: alert & oriented x3  ASSESSMENT & PLAN: 1. A/C HFrEF, ICM  - - Echo 05/16/21: EF <20% along with mild/moderate LAE and mild/moderate MR.  - Echo 09/16/21: EF 30-35% along with mild MR. - Echo 06/29/22: EF 25% with moderate MR.   - LHC 02/10/21: 2v CAD, including 100% midRCA, 70% midLAD with 80% focal distal LAD, 70% D1  elevated LVEDP ( )  -cardiac contractility modulator placed 05/24  -NYHA III. Volume overloaded.  FUROSCIX  prescribed  Patient viewed patient education video with QR code for FUROSCIX    QR code for FUROSCIX  placed on AVS  Call FUROSCIX  Direct at [phone] for questions regarding on body infuser.  Day 1 12/03/23 FUROSCIX  80 mg once daily  via on body infuser   Day 2 12/04/23 FUROSCIX  80 mg once daily  via on body infuser  Day 3 12/05/23 Torsemide  40 mg daily.  - continue losartan  12.5mg  daily - continue metoprolol  succinate 12.5mg  daily - continue spironolactone  to 12.5mg  daily  -  continue torsemide  20mg  daily  - allergic to empagliflozin with a yeast infection  2. HTN Stable. Continue losartan  12.5 mg daily and toprol  XL 12.5 mg daily  3.  DMII - A1c 04/16/23 reviewed and was 7.1% -On Ozempic per PCP. Having difficulty with side effects.   4.  Atrial fibrillation  - Interrogation < 1 hour per day.  - continue metoprolol  succinate 12.5mg  daily - continue apixaban  5 mg BID - no bleeding episodes   5. CAD -  CABG with a LIMA to LAD, SVG to ramus, and SVG to PDA on 02/16/21 - continue atorvastatin  80 mg daily.  - LDL 08/03/21 was 42; repeat LDL next visit - no ASA. Continue apixaban  use   Follow up on Friday to reassess volume status. Repeat BMET at that time.   Greig Mosses, NP 12/03/23

## 2023-12-04 ENCOUNTER — Telehealth: Payer: Self-pay

## 2023-12-04 NOTE — Telephone Encounter (Signed)
 Pt wife called to report that patient is down 3 pounds and feeling so much better after first dose of furoscix  yesterday. She asks if he still needed to take the second furoscix  injection today since I know his kidney's are in horrible shape.  Ellouise Class, NP, notified and patient's spouse informed of provider response.  Per [facility], he can hold today's dose of furoscix  and take the second dose later this week if symptoms return or he starts to gain weight. If he does take another dose of furoscix , he will need to hold torsemide  on that day. Otherwise, resume torsemide  as prescribed in your discharge paperwork.   Pt's spouse verbalized understanding and agreed they will hold furoscix  today and restart torsemide  and will take furoscix   if needed tomorrow or Thursday, for weight gain or symptoms and will follow up in office with Ellouise Class on Friday.

## 2023-12-06 NOTE — Progress Notes (Unsigned)
 Advanced Heart Failure Clinic Note    PCP: Perri Constance Sor, PA-C (last seen 01/25) Primary Cardiologist: Ammon Blunt, MD/ Clarisa Kung, [place] (last seen 11/24) ADHF provider: Rolan Barrack, MD (last seen 02/24)  Chief Complaint: fatigue  HPI:  Jermaine Kramer is a 73 y/o male with a history of leukemia, DM, mesenteric panniculitis, CAD (CABG 02/16/21), AF, sleep apnea, hyperlipidemia, HTN, stroke, GERD & chronic heart failure. Had CCM generator with A/V lead insertion on 09/04/22.  Was in the ED 04/07/22 due to cough/ SOB. Admitted 06/09/22 due to LLQ abdominal pain. CT showed mesenteric pancolitis. Was in the ED 07/21/22 due to LUQ pain.Was in the ED 07/28/22 due to hyperglycemia. Was in the ED 12/22/22 due to left flank pain over the last 24 hours. Abdominal CT negative. Reassuring CBC with a normal white blood cell count, reassuring chemistry with normal LFTs. Normal lipase. Urinalysis shows no concerning findings.     Was in the ED 04/05/23 due to weakness thought to be due to ozempic use. Ozempic stopped. Was in the ED 04/16/23 due to hypotension. SBP in the 70's. Has had decreased fluid intake since ozempic usage which was stopped 2 weeks prior. Oral intake improving but still with nausea. Labs show no significant anemia, leukocytosis, electrolyte abnormality, or AKI. EKG shows no evidence of arrhythmia or ischemia.   Echo 05/16/21: EF <20% along with mild/moderate LAE and mild/moderate Jermaine.  Echo 09/16/21: EF 30-35% along with mild Jermaine. Echo 06/29/22: EF 25% with moderate Jermaine.    LHC 02/10/21:  2v CAD, including 100% midRCA, 70% midLAD with 80% focal distal LAD, 70% D1  Elevated LVEDP ( )   Cardiac MRI 01/27/21:  1. The left ventricle is mildly dilated.  Wall thickness is normal.     a.  The inferior wall is akinetic.     b.  The basal septum is severely hypokinetic.  The mid-distal septum is moderately hypokinetic.     c.  The basal anterior wall is severely hypokinetic. The  mid-distal anterior wall and apex are  moderately hypokinetic.     d.  The lateral wall is moderately. hypokinetic.   Global LV systolic function is severely reduced with an LVEF calculated at 30%.    2. The right ventricle is normal in cavity size and wall thickness.  There is mild global hypokinesis with  mildly reduced RV systolic function.  The RVEF is calculated at 43%.   He presents today, with his wife, for a HF follow-up visit with a chief complaint of fatigue (improving). Has shortness of breath along with this. Denies chest pain, palpitations, abdominal distention, pedal edema, dizziness or difficulty sleeping. Overall he and his wife think he's doing a little bit better.   At last visit, torsemide  was decreased to 20mg  daily with 20mg  PRN due to hypotension. Home BP log reviewed and BP's are much better. Has not had to take any PRN torsemide .   Is now back on low dose ozempic and tolerating this dose.   ROS: All systems negative except as listed in HPI, PMH and Problem List.  SH:  Social History   Socioeconomic History   Marital status: Married    Spouse name: Not on file   Number of children: Not on file   Years of education: Not on file   Highest education level: Not on file  Occupational History   Not on file  Tobacco Use   Smoking status: Never   Smokeless tobacco: Never  Vaping Use  Vaping status: Never Used  Substance and Sexual Activity   Alcohol use: Not Currently   Drug use: No   Sexual activity: Not Currently  Other Topics Concern   Not on file  Social History Narrative   Not on file   Social Drivers of Health   Financial Resource Strain: Low Risk  (10/24/2023)   Received from [facility]   Overall Financial Resource Strain (CARDIA)    Difficulty of Paying Living Expenses: Not hard at all  Food Insecurity: No Food Insecurity (10/24/2023)   Received from [facility]   Hunger Vital Sign    Within the past 12  months, you worried that your food would run out before you got the money to buy more.: Never true    Within the past 12 months, the food you bought just didn't last and you didn't have money to get more.: Never true  Transportation Needs: No Transportation Needs (10/24/2023)   Received from [facility] - Transportation    In the past 12 months, has lack of transportation kept you from medical appointments or from getting medications?: No    Lack of Transportation (Non-Medical): No  Physical Activity: Not on file  Stress: Not on file  Social Connections: Not on file  Intimate Partner Violence: Not At Risk (06/10/2022)   Humiliation, Afraid, Rape, and Kick questionnaire    Fear of Current or Ex-Partner: No    Emotionally Abused: No    Physically Abused: No    Sexually Abused: No    FH:  Family History  Problem Relation Age of Onset   Parkinson's disease Mother    Lupus Mother    Heart disease Father     Past Medical History:  Diagnosis Date   Arrhythmia    atrial fibrillation   CHF (congestive heart failure) (HCC)    Coronary artery disease    Diabetes mellitus without complication (HCC)    GERD (gastroesophageal reflux disease)    Hypercholesteremia    Hypertension    Sleep apnea    Stroke [facility])     Current Outpatient Medications  Medication Sig Dispense Refill   apixaban  (ELIQUIS ) 5 MG TABS tablet Take 1 tablet (5 mg total) by mouth 2 (two) times daily. 180 tablet 3   atorvastatin  (LIPITOR ) 80 MG tablet Take 1 tablet (80 mg total) by mouth daily. 30 tablet 5   Furosemide  (FUROSCIX ) 80 MG/10ML CTKT Inject 80 mg into the skin daily for 2 days.     losartan  (COZAAR ) 25 MG tablet TAKE 1/2 TABLET BY MOUTH DAILY 45 tablet 2   metFORMIN  (GLUCOPHAGE ) 500 MG tablet Take 500 mg by mouth 2 (two) times daily.     metoprolol  succinate (TOPROL -XL) 25 MG 24 hr tablet TAKE 1/2 TABLET BY MOUTH EVERY EVENING 45 tablet 3   omeprazole (PRILOSEC) 40 MG capsule  Take 40 mg by mouth daily.     ondansetron  (ZOFRAN -ODT) 4 MG disintegrating tablet Take 1 tablet (4 mg total) by mouth every 8 (eight) hours as needed for nausea or vomiting. 12 tablet 0   Semaglutide,0.25 or 0.5MG /DOS, 2 MG/3ML SOPN Inject 0.25 mg into the skin once a week.     spironolactone  (ALDACTONE ) 25 MG tablet TAKE 1 TABLET (25 MG TOTAL) BY MOUTH DAILY. 90 tablet 0   tamsulosin  (FLOMAX ) 0.4 MG CAPS capsule Take 0.4 mg by mouth in the morning and at bedtime.     torsemide  (DEMADEX ) 20 MG tablet TAKE  1 TABLET (20 MG TOTAL) BY MOUTH DAILY. AND ADDITIONAL 20MG  IF NEEDED 180 tablet 1   No current facility-administered medications for this visit.   There were no vitals filed for this visit.  Wt Readings from Last 3 Encounters:  06/22/23 188 lb (85.3 kg)  04/20/23 177 lb (80.3 kg)  04/16/23 167 lb (75.8 kg)   Lab Results  Component Value Date   CREATININE 1.53 (H) 04/16/2023   CREATININE 1.97 (H) 04/05/2023   CREATININE 1.60 (H) 12/22/2022    PHYSICAL EXAM:  General: Well appearing. No resp difficulty HEENT: normal Neck: supple, no JVD Cor: Regular rhythm, rate. No rubs, gallops or murmurs Lungs: clear Abdomen: soft, nontender, nondistended. Extremities: no cyanosis, clubbing, rash, edema Neuro: alert & oriented X 3. Moves all 4 extremities w/o difficulty. 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  - Echo 09/16/21: EF 30-35% along with mild Jermaine. - Echo 06/29/22: EF 25% with moderate Jermaine.   - LHC 02/10/21: 2v CAD, including 100% midRCA, 70% midLAD with  80% focal distal LAD, 70% D1  elevated LVEDP ( )  - Cardiac MRI 01/27/21: 1. The left ventricle is mildly dilated. Wall thickness is normal. The inferior wall is akinetic. The basal septum is severely hypokinetic. The mid-distal septum is moderately hypokinetic. The basal anterior wall is severely hypokinetic. The mid-distal anterior wall and apex are moderately hypokinetic. The lateral wall is moderately. hypokinetic. 2. The right ventricle is normal in cavity size and wall thickness. There is mild global hypokinesis with mildly reduced RV systolic function. The RVEF is calculated at 43%.  - not adding salt and had been reading food labels for sodium content  - continue losartan  12.5mg  daily - continue metoprolol  succinate 12.5mg  daily - continue spironolactone  to 12.5mg  daily  - continue torsemide  20mg  daily with additional 20mg  if needed for weight gain, swelling/ SOB (has not taken any PRN torsemide  doses) - allergic to empagliflozin with a yeast infection - saw ADHF provider Larance) 02/24 - saw EP Marny) 03/24 - cardiac contractility modulator placed 05/24  - had AICD implanted 06/22/21; no shocks have been delivered - BNP 07/21/22 was 837.9  2: HTN- - BP 104/63 - saw PCP (Olmedo) 01/25 - BMP 04/16/23 reviewed and showed sodium 135, potassium 4.4, creatinine 1.53 & GFR 47  3: DM with CKD- - A1c 04/16/23 reviewed and was 7.1% - back on low dose ozempic and tolerating this well at this time - continue glipizide 2.5mg  daily - saw nephrology Geoffry) 11/24; returns 03/25 and will be getting lab work done at that time  4: Atrial fibrillation- - saw EP Marny) 08/24 - continue metoprolol  succinate 12.5mg  daily - continue apixaban  5mg  BID - no bleeding episodes  -if he has recurrent AF, would favor ablation. 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   5: CAD- - saw cardiology Melva) 11/24 - CABG with a LIMA to LAD, SVG to ramus,  and SVG to PDA on 02/16/21 - continue atorvastatin  80 mg daily.  -  LDL 08/03/21 was 42; repeat LDL next visit - no ASA given stable CAD and apixaban  use   Returns in 6 months, sooner if needed  Ellouise DELENA Class, [place] 12/06/23

## 2023-12-07 ENCOUNTER — Other Ambulatory Visit
Admission: RE | Admit: 2023-12-07 | Discharge: 2023-12-07 | Disposition: A | Discharge status: Home / Self Care | Source: Ambulatory Visit | Attending: Family | Admitting: Family

## 2023-12-07 ENCOUNTER — Ambulatory Visit: Payer: Self-pay | Admitting: Family

## 2023-12-07 ENCOUNTER — Encounter: Payer: Self-pay | Admitting: Family

## 2023-12-07 ENCOUNTER — Ambulatory Visit (HOSPITAL_BASED_OUTPATIENT_CLINIC_OR_DEPARTMENT_OTHER): Admitting: Family

## 2023-12-07 VITALS — BP 92/64 | HR 77 | Wt 183.0 lb

## 2023-12-07 DIAGNOSIS — I5042 Chronic combined systolic (congestive) and diastolic (congestive) heart failure: Secondary | ICD-10-CM

## 2023-12-07 DIAGNOSIS — I10 Essential (primary) hypertension: Secondary | ICD-10-CM

## 2023-12-07 DIAGNOSIS — I2510 Atherosclerotic heart disease of native coronary artery without angina pectoris: Secondary | ICD-10-CM

## 2023-12-07 DIAGNOSIS — E1122 Type 2 diabetes mellitus with diabetic chronic kidney disease: Secondary | ICD-10-CM

## 2023-12-07 DIAGNOSIS — I480 Paroxysmal atrial fibrillation: Secondary | ICD-10-CM | POA: Diagnosis not present

## 2023-12-07 DIAGNOSIS — N1832 Chronic kidney disease, stage 3b: Secondary | ICD-10-CM

## 2023-12-07 LAB — BASIC METABOLIC PANEL WITH GFR
Anion gap: 14 (ref 5–15)
BUN: 34 mg/dL — ABNORMAL HIGH (ref 8–23)
CO2: 21 mmol/L — ABNORMAL LOW (ref 22–32)
Calcium: 9.3 mg/dL (ref 8.9–10.3)
Chloride: 106 mmol/L (ref 98–111)
Creatinine, Ser: 1.94 mg/dL — ABNORMAL HIGH (ref 0.61–1.24)
GFR, Estimated: 35 mL/min — ABNORMAL LOW (ref >60)
Glucose, Bld: 142 mg/dL — ABNORMAL HIGH (ref 70–99)
Potassium: 4.1 mmol/L (ref 3.5–5.1)
Sodium: 141 mmol/L (ref 135–145)

## 2023-12-07 LAB — BRAIN NATRIURETIC PEPTIDE: B Natriuretic Peptide: 1053.7 pg/mL — ABNORMAL HIGH (ref 0.0–100.0)

## 2023-12-07 MED ORDER — TORSEMIDE 20 MG PO TABS: 20.0000 mg | ORAL_TABLET | Freq: Two times a day (BID) | ORAL | Status: DC

## 2023-12-07 NOTE — Telephone Encounter (Signed)
 Spoke to pt and family about lab work and new medication changes. Pt agreeable to temporary changes and blood work next Friday.

## 2023-12-07 NOTE — Telephone Encounter (Signed)
-----   Message from Ellouise DELENA Class sent at 12/07/2023  1:48 PM EDT ----- Kidney function/ potassium are stable. BNP level that indicates heart stress is elevated. Take an extra 20mg  torsemide  every other day X 3 doses. BMET/ BNP next week.  ----- Message ----- From: Interface, Lab In [place] Sent: 12/07/2023  12:29 PM EDT To: Ellouise DELENA Class, FNP

## 2023-12-07 NOTE — Patient Instructions (Addendum)
 Medication Changes:  No medication changes today!  Lab Work:  Go over to the [facility]. Go pass the gift shop and have your blood work completed.  We will only call you if the results are abnormal or if the provider would like to make medication changes.  No news is good news.   Special Instructions // Education:  Please wear compression socks daily and remove at bedtime.  Follow-Up in: Please keep follow up appointment with the Advanced Heart Failure Clinic in 2 weeks.    Thank you for choosing [facility] Advanced [facility] Failure [facility].    At the Advanced Heart Failure Clinic, you and your health needs are our priority. We have a designated team specialized in the treatment of Heart Failure. This Care Team includes your primary Heart Failure Specialized Cardiologist (physician), Advanced Practice Providers (APPs- Physician Assistants and Nurse Practitioners), and Pharmacist who all work together to provide you with the care you need, when you need it.   You may see any of the following providers on your designated Care Team at your next follow up:  Dr. Toribio Fuel Dr. Ezra Shuck Dr. Ria Commander Dr. Morene Brownie Ellouise Class, FNP Jaun Bash, RPH-CPP  Please be sure to bring in all your medications bottles to every appointment.   Need to Contact Us :  If you have any questions or concerns before your next appointment please send us  a message through [place] or call our office at [phone].    TO LEAVE A MESSAGE FOR THE NURSE SELECT OPTION 2, PLEASE LEAVE A MESSAGE INCLUDING: YOUR NAME DATE OF BIRTH CALL BACK NUMBER REASON FOR CALL**this is important as we prioritize the call backs  YOU WILL RECEIVE A CALL BACK THE SAME DAY AS LONG AS YOU CALL BEFORE 4:00 PM

## 2023-12-13 ENCOUNTER — Telehealth: Payer: Self-pay

## 2023-12-13 MED ORDER — METOLAZONE 2.5 MG PO TABS: 2.5000 mg | ORAL_TABLET | Freq: Every day | ORAL | 3 refills | Status: DC

## 2023-12-13 NOTE — Telephone Encounter (Signed)
 Pt wife agreeable to new medication and lab work. No further questions at this time. Orders placed

## 2023-12-13 NOTE — Telephone Encounter (Signed)
 Callers name: Arland Relation to patient: spouse  Call back phone #: [phone]    Issue/reason for call: wife called. Pt weight has gone up in the last few days. His weight went from 179lbs to 185lbs (from home scale) in the last 4 days. He has previous completed furosix and temporary torsemide  changes with no lasting result. Wife states that he didn't lose any weight from the torsemide  change. His abdomen is still swollen. Denies swelling in legs. Takes bp daily and it has been stable, 116/94 this am.

## 2023-12-14 ENCOUNTER — Other Ambulatory Visit
Admission: RE | Admit: 2023-12-14 | Discharge: 2023-12-14 | Disposition: A | Discharge status: Home / Self Care | Attending: Family | Admitting: Family

## 2023-12-14 ENCOUNTER — Ambulatory Visit: Payer: Self-pay | Admitting: Family

## 2023-12-14 DIAGNOSIS — I5042 Chronic combined systolic (congestive) and diastolic (congestive) heart failure: Secondary | ICD-10-CM | POA: Insufficient documentation

## 2023-12-14 LAB — BASIC METABOLIC PANEL WITH GFR
Anion gap: 10 (ref 5–15)
BUN: 38 mg/dL — ABNORMAL HIGH (ref 8–23)
CO2: 23 mmol/L (ref 22–32)
Calcium: 8.8 mg/dL — ABNORMAL LOW (ref 8.9–10.3)
Chloride: 102 mmol/L (ref 98–111)
Creatinine, Ser: 2.11 mg/dL — ABNORMAL HIGH (ref 0.61–1.24)
GFR, Estimated: 32 mL/min — ABNORMAL LOW (ref >60)
Glucose, Bld: 175 mg/dL — ABNORMAL HIGH (ref 70–99)
Potassium: 3.7 mmol/L (ref 3.5–5.1)
Sodium: 135 mmol/L (ref 135–145)

## 2023-12-14 LAB — BRAIN NATRIURETIC PEPTIDE: B Natriuretic Peptide: 1336.4 pg/mL — ABNORMAL HIGH (ref 0.0–100.0)

## 2023-12-19 ENCOUNTER — Ambulatory Visit (INDEPENDENT_AMBULATORY_CARE_PROVIDER_SITE_OTHER): Payer: Medicare Other

## 2023-12-19 DIAGNOSIS — I255 Ischemic cardiomyopathy: Secondary | ICD-10-CM

## 2023-12-20 ENCOUNTER — Telehealth: Payer: Self-pay | Admitting: Family

## 2023-12-20 LAB — CUP PACEART REMOTE DEVICE CHECK
Battery Remaining Longevity: 91 mo
Battery Voltage: 2.98 V
Brady Statistic AP VP Percent: 0.19 %
Brady Statistic AP VS Percent: 2 %
Brady Statistic AS VP Percent: 0.05 %
Brady Statistic AS VS Percent: 97.76 %
Brady Statistic RA Percent Paced: 2.16 %
Brady Statistic RV Percent Paced: 0.22 %
Date Time Interrogation Session: 20250821074231
HighPow Impedance: 53 Ohm
Implantable Lead Connection Status: 753985
Implantable Lead Connection Status: 753985
Implantable Lead Implant Date: 20230222
Implantable Lead Implant Date: 20230222
Implantable Lead Location: 753859
Implantable Lead Location: 753860
Implantable Lead Model: 5076
Implantable Pulse Generator Implant Date: 20230222
Lead Channel Impedance Value: 361 Ohm
Lead Channel Impedance Value: 456 Ohm
Lead Channel Impedance Value: 456 Ohm
Lead Channel Pacing Threshold Amplitude: 0.5 V
Lead Channel Pacing Threshold Amplitude: 0.75 V
Lead Channel Pacing Threshold Pulse Width: 0.4 ms
Lead Channel Pacing Threshold Pulse Width: 0.4 ms
Lead Channel Sensing Intrinsic Amplitude: 1.25 mV
Lead Channel Sensing Intrinsic Amplitude: 1.25 mV
Lead Channel Sensing Intrinsic Amplitude: 9.875 mV
Lead Channel Sensing Intrinsic Amplitude: 9.875 mV
Lead Channel Setting Pacing Amplitude: 1.5 V
Lead Channel Setting Pacing Amplitude: 1.5 V
Lead Channel Setting Pacing Pulse Width: 0.4 ms
Lead Channel Setting Sensing Sensitivity: 0.3 mV
Zone Setting Status: 755011
Zone Setting Status: 755011

## 2023-12-20 NOTE — Telephone Encounter (Signed)
 Called to confirm/remind patient of their appointment at the Advanced Heart Failure Clinic on 12/21/23.   Appointment:   [x] Confirmed  [] Left mess   [] No answer/No voice mail  [] VM Full/unable to leave message  [] Phone not in service  Patient reminded to bring all medications and/or complete list.  Confirmed patient has transportation. Gave directions, instructed to utilize valet parking.

## 2023-12-20 NOTE — Progress Notes (Unsigned)
 Advanced Heart Failure Clinic Note    PCP: Perri Constance Sor, PA-C  Primary Cardiologist: Ammon Blunt, MD/ Clarisa Kung, [place] (last seen 07/25) ADHF provider: Rolan Barrack, MD   Chief Complaint: shortness of breath   HPI:  Jermaine Kramer is a 73 y/o male with a history of leukemia, DM, mesenteric panniculitis, CAD (CABG 02/16/21), AF, sleep apnea, hyperlipidemia, HTN, stroke, GERD & chronic heart failure. Had CCM generator with A/V lead insertion on 09/04/22.  Was in the ED 04/07/22 due to cough/ SOB.   Echo 05/16/21: EF <20% along with mild/moderate LAE and mild/moderate Jermaine.  Echo 09/16/21: EF 30-35% along with mild Jermaine.  Admitted 06/09/22 due to LLQ abdominal pain. CT showed mesenteric pancolitis.   Echo 06/29/22: EF 25% with moderate Jermaine.    Was in the ED 12/22/22 due to left flank pain over the last 24 hours. Abdominal CT negative. Reassuring CBC with a normal white blood cell count, reassuring chemistry with normal LFTs. Normal lipase. Urinalysis shows no concerning findings.     Was in the ED 04/05/23 due to weakness thought to be due to ozempic use. Ozempic stopped.   Was in the ED 04/16/23 due to hypotension. SBP in the 70's. Has had decreased fluid intake since ozempic usage which was stopped 2 weeks prior. Oral intake improving but still with nausea. Labs show no significant anemia, leukocytosis, electrolyte abnormality, or AKI. EKG shows no evidence of arrhythmia or ischemia.   Seen in [facility] 12/03/23 due to being fluid up. Was given furoscix  X 2 days. Patient called the office the following day down 3 pounds and feeling much better. Asking if they still needed to take the 2nd fuoscix due to renal function. Advised to hold it and use if needed later in the week.   He presents today, with his wife, for a HF follow-up visit with a chief complaint of shortness of breath (unchanged). Has decreased appetite, fatigue and continued nausea. Wife / patient continue to be frustrated with his  constant nausea/ intermittent diarrhea each week with ozempic use. Has numerous appointment over the next several months. Took the metolazone  since last visit but doesn't think it made much difference.    Previous cardiac studies:   LHC 02/10/21:  2v CAD, including 100% midRCA, 70% midLAD with 80% focal distal LAD, 70% D1  Elevated LVEDP ( )   Cardiac MRI 01/27/21:  1. The left ventricle is mildly dilated.  Wall thickness is normal.     a.  The inferior wall is akinetic.     b.  The basal septum is severely hypokinetic.  The mid-distal septum is moderately hypokinetic.     c.  The basal anterior wall is severely hypokinetic. The mid-distal anterior wall and apex are  moderately hypokinetic.     d.  The lateral wall is moderately. hypokinetic.   Global LV systolic function is severely reduced with an LVEF calculated at 30%.    2. The right ventricle is normal in cavity size and wall thickness.  There is mild global hypokinesis with  mildly reduced RV systolic function.  The RVEF is calculated at 43%.   ROS: All systems negative except as listed in HPI, PMH and Problem List.  SH:  Social History   Socioeconomic History   Marital status: Married    Spouse name: Not on file   Number of children: Not on file   Years of education: Not on file   Highest education level: Not on file  Occupational History  Not on file  Tobacco Use   Smoking status: Never   Smokeless tobacco: Never  Vaping Use   Vaping status: Never Used  Substance and Sexual Activity   Alcohol use: Not Currently   Drug use: No   Sexual activity: Not Currently  Other Topics Concern   Not on file  Social History Narrative   Not on file   Social Drivers of Health   Financial Resource Strain: Low Risk  (10/24/2023)   Received [facility]   Overall Financial Resource Strain (CARDIA)    Difficulty of Paying Living Expenses: Not hard at all  Food Insecurity: No Food Insecurity  (10/24/2023)   Received from [facility]   Hunger Vital Sign    Within the past 12 months, you worried that your food would run out before you got the money to buy more.: Never true    Within the past 12 months, the food you bought just didn't last and you didn't have money to get more.: Never true  Transportation Needs: No Transportation Needs (10/24/2023)   Received from [facility] - Transportation    In the past 12 months, has lack of transportation kept you from medical appointments or from getting medications?: No    Lack of Transportation (Non-Medical): No  Physical Activity: Not on file  Stress: Not on file  Social Connections: Not on file  Intimate Partner Violence: Not At Risk (06/10/2022)   Humiliation, Afraid, Rape, and Kick questionnaire    Fear of Current or Ex-Partner: No    Emotionally Abused: No    Physically Abused: No    Sexually Abused: No    FH:  Family History  Problem Relation Age of Onset   Parkinson's disease Mother    Lupus Mother    Heart disease Father     Past Medical History:  Diagnosis Date   Arrhythmia    atrial fibrillation   CHF (congestive heart failure) (HCC)    Coronary artery disease    Diabetes mellitus without complication (HCC)    GERD (gastroesophageal reflux disease)    Hypercholesteremia    Hypertension    Sleep apnea    Stroke [facility])     Current Outpatient Medications  Medication Sig Dispense Refill   apixaban  (ELIQUIS ) 5 MG TABS tablet Take 1 tablet (5 mg total) by mouth 2 (two) times daily. 180 tablet 3   atorvastatin  (LIPITOR ) 80 MG tablet Take 1 tablet (80 mg total) by mouth daily. 30 tablet 5   Furosemide  (FUROSCIX ) 80 MG/10ML CTKT Inject 80 mg into the skin daily for 2 days.     losartan  (COZAAR ) 25 MG tablet TAKE 1/2 TABLET BY MOUTH DAILY 45 tablet 2   metFORMIN  (GLUCOPHAGE ) 500 MG tablet Take 500 mg by mouth 2 (two) times daily.     metolazone  (ZAROXOLYN ) 2.5 MG tablet Take  1 tablet (2.5 mg total) by mouth daily. 10 tablet 3   metoprolol  succinate (TOPROL -XL) 25 MG 24 hr tablet TAKE 1/2 TABLET BY MOUTH EVERY EVENING 45 tablet 3   omeprazole (PRILOSEC) 40 MG capsule Take 40 mg by mouth daily.     ondansetron  (ZOFRAN -ODT) 4 MG disintegrating tablet Take 1 tablet (4 mg total) by mouth every 8 (eight) hours as needed for nausea or vomiting. 12 tablet 0   Semaglutide,0.25 or 0.5MG /DOS, 2 MG/3ML SOPN Inject 0.25 mg into the skin once a week.     spironolactone  (ALDACTONE ) 25 MG tablet TAKE  1 TABLET (25 MG TOTAL) BY MOUTH DAILY. 90 tablet 0   tamsulosin  (FLOMAX ) 0.4 MG CAPS capsule Take 0.4 mg by mouth in the morning and at bedtime.     torsemide  (DEMADEX ) 20 MG tablet Take 1 tablet (20 mg total) by mouth 2 (two) times daily.     No current facility-administered medications for this visit.   Vitals:   12/21/23 0854  BP: 91/61  Pulse: 75  SpO2: 97%  Weight: 182 lb (82.6 kg)   Wt Readings from Last 3 Encounters:  12/21/23 182 lb (82.6 kg)  12/07/23 183 lb (83 kg)  06/22/23 188 lb (85.3 kg)   Lab Results  Component Value Date   CREATININE 1.84 (H) 12/21/2023   CREATININE 2.11 (H) 12/14/2023   CREATININE 1.94 (H) 12/07/2023    PHYSICAL EXAM:  General: Well appearing.  Cor: No JVD. Regular rhythm, rate.  Lungs: clear Abdomen: soft, nontender, nondistended. Extremities: no edema Neuro:. 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  - Echo 09/16/21: EF 30-35% along with mild Jermaine. - Echo 06/29/22: EF 25% with moderate Jermaine.   - LHC 02/10/21: 2v CAD, including 100% midRCA, 70% midLAD with 80% focal distal LAD, 70% D1  elevated LVEDP ( )  - Cardiac MRI 01/27/21: 1. The left ventricle is mildly dilated.  Wall thickness is normal. The inferior wall is akinetic. The basal septum is severely hypokinetic. The mid-distal septum is moderately hypokinetic. The basal anterior wall is severely hypokinetic. The mid-distal anterior wall and apex are moderately hypokinetic. The lateral wall is moderately. hypokinetic. 2. The right ventricle is normal in cavity size and wall thickness. There is mild global hypokinesis with mildly reduced RV systolic function. The RVEF is calculated at 43%.  - not adding salt and had been reading food labels for sodium content  - encouraged to get compression socks and wear them daily with removal at bedtime - continue losartan  12.5mg  daily - continue metoprolol  succinate 12.5mg  daily - continue spironolactone  25mg  daily  - continue torsemide  20mg  daily with additional 20mg  if needed for weight gain, swelling/ SOB  - allergic to empagliflozin with a yeast infection - saw ADHF provider Larance) 02/24 - saw EP Marny) 03/24 - cardiac contractility modulator placed 05/24  - had AICD implanted 06/22/21; no shocks have been delivered - BNP 07/21/22 was 837.9. BNP today  2: HTN- - BP 91/61, no dizziness - saw PCP Sonny) 08/25 - BMP 12/14/23 reviewed: sodium 135, potassium 3.7, creatinine 2.11 & GFR 32 - BMET today  3: DM with CKD- - A1c 10/24/23 was 7.6% - continue ozempic 0.5mg  weekly. Encouraged to discuss usage with PCP as  - continue metformin  - saw nephrology Geoffry) 07/25  4: Atrial fibrillation- - saw EP Marny) 08/24 - continue metoprolol  succinate 12.5mg  daily - continue apixaban  5mg  BID - no bleeding episodes  -if he has recurrent AF, would favor ablation. 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   5: CAD- - saw cardiology Melva) 07/25 - CABG with a LIMA to LAD, SVG to ramus, and SVG to PDA on 02/16/21 - continue atorvastatin  80 mg daily.  - LDL 07/16/23 was 52 - no ASA given stable CAD and apixaban   use   Return in 5 months, sooner if needed.   Ellouise DELENA Class, FNP 12/20/23

## 2023-12-21 ENCOUNTER — Ambulatory Visit: Payer: Self-pay | Admitting: Family

## 2023-12-21 ENCOUNTER — Encounter: Payer: Self-pay | Admitting: Family

## 2023-12-21 ENCOUNTER — Ambulatory Visit: Payer: Medicare Other | Admitting: Family

## 2023-12-21 ENCOUNTER — Other Ambulatory Visit
Admission: RE | Admit: 2023-12-21 | Discharge: 2023-12-21 | Disposition: A | Discharge status: Home / Self Care | Source: Ambulatory Visit | Attending: Family | Admitting: Family

## 2023-12-21 ENCOUNTER — Ambulatory Visit: Payer: Self-pay | Admitting: Cardiology

## 2023-12-21 VITALS — BP 91/61 | HR 75 | Wt 182.0 lb

## 2023-12-21 DIAGNOSIS — I480 Paroxysmal atrial fibrillation: Secondary | ICD-10-CM

## 2023-12-21 DIAGNOSIS — I10 Essential (primary) hypertension: Secondary | ICD-10-CM | POA: Diagnosis not present

## 2023-12-21 DIAGNOSIS — I2510 Atherosclerotic heart disease of native coronary artery without angina pectoris: Secondary | ICD-10-CM

## 2023-12-21 DIAGNOSIS — N1832 Chronic kidney disease, stage 3b: Secondary | ICD-10-CM

## 2023-12-21 DIAGNOSIS — I5042 Chronic combined systolic (congestive) and diastolic (congestive) heart failure: Secondary | ICD-10-CM | POA: Insufficient documentation

## 2023-12-21 DIAGNOSIS — I5022 Chronic systolic (congestive) heart failure: Secondary | ICD-10-CM

## 2023-12-21 DIAGNOSIS — E1122 Type 2 diabetes mellitus with diabetic chronic kidney disease: Secondary | ICD-10-CM

## 2023-12-21 LAB — BASIC METABOLIC PANEL WITH GFR
Anion gap: 10 (ref 5–15)
BUN: 35 mg/dL — ABNORMAL HIGH (ref 8–23)
CO2: 24 mmol/L (ref 22–32)
Calcium: 9.1 mg/dL (ref 8.9–10.3)
Chloride: 100 mmol/L (ref 98–111)
Creatinine, Ser: 1.84 mg/dL — ABNORMAL HIGH (ref 0.61–1.24)
GFR, Estimated: 38 mL/min — ABNORMAL LOW (ref >60)
Glucose, Bld: 165 mg/dL — ABNORMAL HIGH (ref 70–99)
Potassium: 3.8 mmol/L (ref 3.5–5.1)
Sodium: 134 mmol/L — ABNORMAL LOW (ref 135–145)

## 2023-12-21 LAB — BRAIN NATRIURETIC PEPTIDE: B Natriuretic Peptide: 1128.3 pg/mL — ABNORMAL HIGH (ref 0.0–100.0)

## 2023-12-21 NOTE — Telephone Encounter (Signed)
 Relayed lab information. Pt and wife grateful for the new. No further questions at this time.

## 2023-12-21 NOTE — Patient Instructions (Signed)
 Medication Changes:  No medication changes today!  Lab Work:  Go over to the [facility]. Go pass the gift shop and have your blood work completed.  We will only call you if the results are abnormal or if the provider would like to make medication changes.  No news is good news.   Follow-Up in: Please follow up with the Advanced Heart Failure Clinic in January 2026 with Ellouise Class, FNP.   Thank you for choosing [facility] Advanced Heart Failure Clinic.    At the Advanced Heart Failure Clinic, you and your health needs are our priority. We have a designated team specialized in the treatment of Heart Failure. This Care Team includes your primary Heart Failure Specialized Cardiologist (physician), Advanced Practice Providers (APPs- Physician Assistants and Nurse Practitioners), and Pharmacist who all work together to provide you with the care you need, when you need it.   You may see any of the following providers on your designated Care Team at your next follow up:  Dr. Toribio Fuel Dr. Ezra Shuck Dr. Ria Commander Dr. Morene Brownie Ellouise Class, FNP Jaun Bash, RPH-CPP  Please be sure to bring in all your medications bottles to every appointment.   Need to Contact Us :  If you have any questions or concerns before your next appointment please send us  a message through [place] or call our office at [phone].    TO LEAVE A MESSAGE FOR THE NURSE SELECT OPTION 2, PLEASE LEAVE A MESSAGE INCLUDING: YOUR NAME DATE OF BIRTH CALL BACK NUMBER REASON FOR CALL**this is important as we prioritize the call backs  YOU WILL RECEIVE A CALL BACK THE SAME DAY AS LONG AS YOU CALL BEFORE 4:00 PM

## 2023-12-22 ENCOUNTER — Other Ambulatory Visit: Payer: Self-pay | Admitting: Cardiology

## 2023-12-22 NOTE — Progress Notes (Unsigned)
 Electrophysiology Clinic Note    Date:  12/24/2023  Patient ID:  Jermaine, Kramer 1951-03-06, MRN [id number] PCP:  Perri Constance Sor, PA-C  Cardiologist:  None HF - NP [facility]  Electrophysiologist:  OLE ONEIDA HOLTS, MD  Electrophysiology APP:  Latonya Knight, NP    Discussed the use of AI scribe software for clinical note transcription with the patient, who gave verbal consent to proceed.   Patient Profile    Chief Complaint: 46yr ICD, CCM follow-up  History of Present Illness: Jermaine Kramer is a 73 y.o. male with PMH notable for HFrEF s/p ICD and CCM implant, parox AFib, ICM s/p CABG, HTN, CKD - 3b, T2DM, CVA, OSA; seen today for OLE ONEIDA HOLTS, MD for routine electrophysiology followup.   He last saw Dr. HOLTS 11/2022 for routine 3 month post-CCM implant at which time he had improved energy since implant.   He saw HF NP [facility] 8/22 with ongoing decreased appetite, fatigue, nausea (thought associated with ozempic ues).   On follow-up today, he continues to have N/D and constipation. Patient's wife has sent a message to PCP about ozempic use to discuss stopping vs alternatives. He has stable chest tightness and SOB. Has been present for many years. He has no palpitations, presyncope.  Continues to take eliquis  BID, no bleeding concerns.  Weight is stable. Tends to hold fluid in abd, appears stable today. Denies lower extremity edema.   He charges CCM weekly on Thursday.       Arrhythmia/Device History MDT dual chamber ICD, imp 2023; dx CHF  CCM, imp 08/2022   AAD -  Amiodarone  - stopped d/t SE (tremor)     ROS:  Please see the history of present illness. All other systems are reviewed and otherwise negative.    Physical Exam    VS:  BP 118/76 (BP Location: Left Arm, Patient Position: Sitting, Cuff Size: Normal)   Pulse 79   Ht 5' 5 (1.651 m)   Wt 183 lb (83 kg)   SpO2 97%   BMI 30.45 kg/m  BMI: Body mass index is 30.45 kg/m.      Wt  Readings from Last 3 Encounters:  12/24/23 183 lb (83 kg)  12/21/23 182 lb (82.6 kg)  12/07/23 183 lb (83 kg)     GEN- The patient is well appearing, alert and oriented x 3 today.   Lungs- Clear to ausculation bilaterally, normal work of breathing.  Heart- Regular rate and rhythm, 2/6 murmur, rubs or gallops Extremities- No peripheral edema, warm, dry Skin-  device pocket well-healed, no tethering   Device interrogation done today and reviewed by myself:  Battery good Lead thresholds, impedence, sensing stable One very brief NSVT episode Low VP Optivol low No changes made  Studies Reviewed   Previous EP, cardiology notes.    EKG is ordered. Personal review of EKG from today shows:   EKG Interpretation Date/Time:  Monday December 24 2023 09:22:46 EDT Ventricular Rate:  79 PR Interval:  156 QRS Duration:  168 QT Interval:  456 QTC Calculation: 522 R Axis:   -46  Text Interpretation: Normal sinus rhythm Left axis deviation Right bundle branch block Confirmed by Carlie Corpus [phone]) on 12/24/2023 9:25:43 AM    TTE, 06/29/2022  1. Left ventricular ejection fraction, by estimation, is 20 to 25%. The left ventricle has severely decreased function. The left ventricle demonstrates global hypokinesis. The left ventricular internal cavity size was moderately dilated. Left ventricular diastolic parameters are consistent  with Grade I diastolic dysfunction (impaired relaxation). The average left ventricular global longitudinal strain is -4.2 %. The global longitudinal strain is abnormal.   2. Right ventricular systolic function is mildly reduced. The right ventricular size is normal. There is normal pulmonary artery systolic pressure. The estimated right ventricular systolic pressure is 26.0 mmHg.   3. The mitral valve is normal in structure. Moderate mitral valve regurgitation. No evidence of mitral stenosis.   4. The aortic valve has an indeterminant number of cusps. Aortic valve  regurgitation is mild. Aortic valve sclerosis/calcification is present, without any evidence of aortic stenosis   TTE, 09/16/2021  1. Left ventricular ejection fraction, by estimation, is 30 to 35%. The left ventricle has moderately decreased function. The left ventricle demonstrates global hypokinesis. The left ventricular internal cavity size was severely dilated. Left ventricular diastolic parameters are consistent with Grade III diastolic dysfunction (restrictive).   2. Right ventricular systolic function is low normal. The right ventricular size is normal. Mildly increased right ventricular wall thickness.   3. Left atrial size was moderately dilated.   4. Right atrial size was moderately dilated.   5. The mitral valve is normal in structure. Mild mitral valve regurgitation.   6. The tricuspid valve is abnormal.   7. The aortic valve is normal in structure. There is mild calcification of the aortic valve. There is mild thickening of the aortic valve. Aortic valve regurgitation is not visualized. Aortic valve sclerosis is present, with no evidence of aortic valve stenosis.    Assessment and Plan     #) ICM s/p ICD #) CCM in situ ICD functioning well, see paceart for details No significant arrhythmias noted Good CCM adherence, device optimized by CCM rep  #) HFrEF Appears warm and euvolemic on exam Optivol low Follows regularly with HF team  #) parox AFib No AFib by ICD  #) Hypercoag d/t parox afib CHA2DS2-VASc Score = at least 8 [CHF History: 1, HTN History: 1, Diabetes History: 1, Stroke History: 2, Vascular Disease History: 1, Age Score: 2, Gender Score: 0].  Therefore, the patient's annual risk of stroke is 10.8 %.    Stroke ppx - 5mg  eliquis  BID, appropriately dosed No bleeding concerns   #) Nausea, diarrhea, constipation Significantly affecting quality of life Recommended patient discuss further with PCP as it appears to correlate with ozempic use.  Consider endocrine  referral        Current medicines are reviewed at length with the patient today.   The patient has concerns regarding his medicines.  The following changes were made today:  none  Labs/ tests ordered today include:  Orders Placed This Encounter  Procedures   EKG 12-Lead     Disposition: Follow up with Dr. Cindie or EP APP in 12 months, continue remote monitoring q53mon   Signed, Chantal Needle, NP  12/24/23  9:53 AM  Electrophysiology [facility]

## 2023-12-24 ENCOUNTER — Ambulatory Visit: Attending: Cardiology | Admitting: Cardiology

## 2023-12-24 ENCOUNTER — Encounter: Payer: Self-pay | Admitting: Cardiology

## 2023-12-24 ENCOUNTER — Ambulatory Visit: Payer: Self-pay | Admitting: Cardiology

## 2023-12-24 VITALS — BP 118/76 | HR 79 | Ht 65.0 in | Wt 183.0 lb

## 2023-12-24 DIAGNOSIS — Z959 Presence of cardiac and vascular implant and graft, unspecified: Secondary | ICD-10-CM | POA: Diagnosis present

## 2023-12-24 DIAGNOSIS — D6869 Other thrombophilia: Secondary | ICD-10-CM | POA: Diagnosis present

## 2023-12-24 DIAGNOSIS — Z95810 Presence of automatic (implantable) cardiac defibrillator: Secondary | ICD-10-CM | POA: Insufficient documentation

## 2023-12-24 DIAGNOSIS — I255 Ischemic cardiomyopathy: Secondary | ICD-10-CM | POA: Diagnosis present

## 2023-12-24 DIAGNOSIS — I480 Paroxysmal atrial fibrillation: Secondary | ICD-10-CM | POA: Diagnosis not present

## 2023-12-24 LAB — CUP PACEART INCLINIC DEVICE CHECK
Date Time Interrogation Session: 20250825103908
Implantable Lead Connection Status: 753985
Implantable Lead Connection Status: 753985
Implantable Lead Implant Date: 20230222
Implantable Lead Implant Date: 20230222
Implantable Lead Location: 753859
Implantable Lead Location: 753860
Implantable Lead Model: 5076
Implantable Pulse Generator Implant Date: 20230222

## 2023-12-24 NOTE — Patient Instructions (Signed)
 Medication Instructions:  Your physician recommends that you continue on your current medications as directed. Please refer to the Current Medication list given to you today.    *If you need a refill on your cardiac medications before your next appointment, please call your pharmacy*  Lab Work: No labs ordered today    Testing/Procedures: No test ordered today   Follow-Up: At [facility], you and your health needs are our priority.  As part of our continuing mission to provide you with exceptional heart care, our providers are all part of one team.  This team includes your primary Cardiologist (physician) and Advanced Practice Providers or APPs (Physician Assistants and Nurse Practitioners) who all work together to provide you with the care you need, when you need it.  Your next appointment:   1 year(s)  Provider:   Ole Holts, MD or Suzann Riddle, NP

## 2024-01-19 ENCOUNTER — Other Ambulatory Visit: Payer: Self-pay

## 2024-01-19 ENCOUNTER — Emergency Department

## 2024-01-19 ENCOUNTER — Inpatient Hospital Stay

## 2024-01-19 ENCOUNTER — Inpatient Hospital Stay
Admission: EM | Admit: 2024-01-19 | Discharge: 2024-01-22 | DRG: 291 | Disposition: A | Discharge status: Home / Self Care | Attending: Internal Medicine | Admitting: Internal Medicine

## 2024-01-19 DIAGNOSIS — E1122 Type 2 diabetes mellitus with diabetic chronic kidney disease: Secondary | ICD-10-CM | POA: Diagnosis present

## 2024-01-19 DIAGNOSIS — Z951 Presence of aortocoronary bypass graft: Secondary | ICD-10-CM | POA: Diagnosis not present

## 2024-01-19 DIAGNOSIS — I255 Ischemic cardiomyopathy: Secondary | ICD-10-CM | POA: Diagnosis present

## 2024-01-19 DIAGNOSIS — T465X5A Adverse effect of other antihypertensive drugs, initial encounter: Secondary | ICD-10-CM | POA: Diagnosis not present

## 2024-01-19 DIAGNOSIS — Z888 Allergy status to other drugs, medicaments and biological substances status: Secondary | ICD-10-CM

## 2024-01-19 DIAGNOSIS — N1832 Chronic kidney disease, stage 3b: Secondary | ICD-10-CM | POA: Diagnosis present

## 2024-01-19 DIAGNOSIS — I480 Paroxysmal atrial fibrillation: Secondary | ICD-10-CM | POA: Diagnosis present

## 2024-01-19 DIAGNOSIS — K219 Gastro-esophageal reflux disease without esophagitis: Secondary | ICD-10-CM | POA: Diagnosis present

## 2024-01-19 DIAGNOSIS — R0602 Shortness of breath: Secondary | ICD-10-CM | POA: Diagnosis present

## 2024-01-19 DIAGNOSIS — I5023 Acute on chronic systolic (congestive) heart failure: Secondary | ICD-10-CM | POA: Diagnosis present

## 2024-01-19 DIAGNOSIS — I5020 Unspecified systolic (congestive) heart failure: Secondary | ICD-10-CM | POA: Diagnosis not present

## 2024-01-19 DIAGNOSIS — Z8269 Family history of other diseases of the musculoskeletal system and connective tissue: Secondary | ICD-10-CM

## 2024-01-19 DIAGNOSIS — Z881 Allergy status to other antibiotic agents status: Secondary | ICD-10-CM

## 2024-01-19 DIAGNOSIS — Z7984 Long term (current) use of oral hypoglycemic drugs: Secondary | ICD-10-CM | POA: Diagnosis not present

## 2024-01-19 DIAGNOSIS — Z794 Long term (current) use of insulin: Secondary | ICD-10-CM

## 2024-01-19 DIAGNOSIS — I2510 Atherosclerotic heart disease of native coronary artery without angina pectoris: Secondary | ICD-10-CM | POA: Diagnosis present

## 2024-01-19 DIAGNOSIS — I130 Hypertensive heart and chronic kidney disease with heart failure and stage 1 through stage 4 chronic kidney disease, or unspecified chronic kidney disease: Principal | ICD-10-CM | POA: Diagnosis present

## 2024-01-19 DIAGNOSIS — N400 Enlarged prostate without lower urinary tract symptoms: Secondary | ICD-10-CM | POA: Diagnosis present

## 2024-01-19 DIAGNOSIS — E871 Hypo-osmolality and hyponatremia: Secondary | ICD-10-CM | POA: Diagnosis present

## 2024-01-19 DIAGNOSIS — Z9841 Cataract extraction status, right eye: Secondary | ICD-10-CM

## 2024-01-19 DIAGNOSIS — Z7901 Long term (current) use of anticoagulants: Secondary | ICD-10-CM | POA: Diagnosis not present

## 2024-01-19 DIAGNOSIS — Z9049 Acquired absence of other specified parts of digestive tract: Secondary | ICD-10-CM

## 2024-01-19 DIAGNOSIS — I959 Hypotension, unspecified: Secondary | ICD-10-CM | POA: Diagnosis not present

## 2024-01-19 DIAGNOSIS — Z8549 Personal history of malignant neoplasm of other male genital organs: Secondary | ICD-10-CM

## 2024-01-19 DIAGNOSIS — G4730 Sleep apnea, unspecified: Secondary | ICD-10-CM | POA: Diagnosis present

## 2024-01-19 DIAGNOSIS — Z8249 Family history of ischemic heart disease and other diseases of the circulatory system: Secondary | ICD-10-CM

## 2024-01-19 DIAGNOSIS — E7800 Pure hypercholesterolemia, unspecified: Secondary | ICD-10-CM | POA: Diagnosis present

## 2024-01-19 DIAGNOSIS — Z820 Family history of epilepsy and other diseases of the nervous system: Secondary | ICD-10-CM

## 2024-01-19 DIAGNOSIS — Z856 Personal history of leukemia: Secondary | ICD-10-CM

## 2024-01-19 DIAGNOSIS — Z9842 Cataract extraction status, left eye: Secondary | ICD-10-CM

## 2024-01-19 DIAGNOSIS — Z8673 Personal history of transient ischemic attack (TIA), and cerebral infarction without residual deficits: Secondary | ICD-10-CM

## 2024-01-19 DIAGNOSIS — Z79899 Other long term (current) drug therapy: Secondary | ICD-10-CM | POA: Diagnosis not present

## 2024-01-19 DIAGNOSIS — Z95810 Presence of automatic (implantable) cardiac defibrillator: Secondary | ICD-10-CM

## 2024-01-19 DIAGNOSIS — Z7985 Long-term (current) use of injectable non-insulin antidiabetic drugs: Secondary | ICD-10-CM | POA: Diagnosis not present

## 2024-01-19 DIAGNOSIS — I5021 Acute systolic (congestive) heart failure: Secondary | ICD-10-CM | POA: Diagnosis not present

## 2024-01-19 DIAGNOSIS — I509 Heart failure, unspecified: Principal | ICD-10-CM

## 2024-01-19 LAB — CBC
HCT: 38.6 % — ABNORMAL LOW (ref 39.0–52.0)
Hemoglobin: 12.4 g/dL — ABNORMAL LOW (ref 13.0–17.0)
MCH: 27.3 pg (ref 26.0–34.0)
MCHC: 32.1 g/dL (ref 30.0–36.0)
MCV: 85 fL (ref 80.0–100.0)
Platelets: 164 K/uL (ref 150–400)
RBC: 4.54 MIL/uL (ref 4.22–5.81)
RDW: 13.4 % (ref 11.5–15.5)
WBC: 5.1 K/uL (ref 4.0–10.5)
nRBC: 0 % (ref 0.0–0.2)

## 2024-01-19 LAB — GLUCOSE, CAPILLARY: Glucose-Capillary: 110 mg/dL — ABNORMAL HIGH (ref 70–99)

## 2024-01-19 LAB — TROPONIN I (HIGH SENSITIVITY)
Troponin I (High Sensitivity): 28 ng/L — ABNORMAL HIGH (ref <18)
Troponin I (High Sensitivity): 21 ng/L — ABNORMAL HIGH (ref <18)

## 2024-01-19 LAB — BASIC METABOLIC PANEL WITH GFR
Anion gap: 12 (ref 5–15)
BUN: 26 mg/dL — ABNORMAL HIGH (ref 8–23)
CO2: 22 mmol/L (ref 22–32)
Calcium: 8.8 mg/dL — ABNORMAL LOW (ref 8.9–10.3)
Chloride: 99 mmol/L (ref 98–111)
Creatinine, Ser: 2.04 mg/dL — ABNORMAL HIGH (ref 0.61–1.24)
GFR, Estimated: 33 mL/min — ABNORMAL LOW (ref >60)
Glucose, Bld: 155 mg/dL — ABNORMAL HIGH (ref 70–99)
Potassium: 4.4 mmol/L (ref 3.5–5.1)
Sodium: 133 mmol/L — ABNORMAL LOW (ref 135–145)

## 2024-01-19 LAB — CBG MONITORING, ED
Glucose-Capillary: 150 mg/dL — ABNORMAL HIGH (ref 70–99)
Glucose-Capillary: 129 mg/dL — ABNORMAL HIGH (ref 70–99)

## 2024-01-19 LAB — BRAIN NATRIURETIC PEPTIDE: B Natriuretic Peptide: 1161 pg/mL — ABNORMAL HIGH (ref 0.0–100.0)

## 2024-01-19 LAB — RESP PANEL BY RT-PCR (RSV, FLU A&B, COVID)  RVPGX2
Influenza A by PCR: NEGATIVE
Influenza B by PCR: NEGATIVE
Resp Syncytial Virus by PCR: NEGATIVE
SARS Coronavirus 2 by RT PCR: NEGATIVE

## 2024-01-19 MED ORDER — ACETAMINOPHEN 325 MG PO TABS: 650.0000 mg | ORAL_TABLET | ORAL | Status: DC | PRN

## 2024-01-19 MED ORDER — TAMSULOSIN HCL 0.4 MG PO CAPS
0.4000 mg | ORAL_CAPSULE | Freq: Every day | ORAL | Status: DC
  Administered 2024-01-19 – 2024-01-21 (×3): 0.4 mg via ORAL
  Filled 2024-01-19 (×3): qty 1

## 2024-01-19 MED ORDER — ATORVASTATIN CALCIUM 80 MG PO TABS
80.0000 mg | ORAL_TABLET | Freq: Every day | ORAL | Status: DC
  Administered 2024-01-19 – 2024-01-22 (×4): 80 mg via ORAL
  Filled 2024-01-19 (×3): qty 1
  Filled 2024-01-19: qty 4

## 2024-01-19 MED ORDER — APIXABAN 5 MG PO TABS
5.0000 mg | ORAL_TABLET | Freq: Two times a day (BID) | ORAL | Status: DC
  Administered 2024-01-19 – 2024-01-22 (×7): 5 mg via ORAL
  Filled 2024-01-19 (×7): qty 1

## 2024-01-19 MED ORDER — SODIUM CHLORIDE 0.9% FLUSH
3.0000 mL | Freq: Two times a day (BID) | INTRAVENOUS | Status: DC
  Administered 2024-01-19 – 2024-01-22 (×7): 3 mL via INTRAVENOUS

## 2024-01-19 MED ORDER — PANTOPRAZOLE SODIUM 40 MG PO TBEC
40.0000 mg | DELAYED_RELEASE_TABLET | Freq: Every day | ORAL | Status: DC
  Administered 2024-01-19 – 2024-01-22 (×4): 40 mg via ORAL
  Filled 2024-01-19 (×4): qty 1

## 2024-01-19 MED ORDER — SODIUM CHLORIDE 0.9% FLUSH: 3.0000 mL | INTRAVENOUS | Status: DC | PRN

## 2024-01-19 MED ORDER — METOLAZONE 2.5 MG PO TABS
2.5000 mg | ORAL_TABLET | Freq: Every day | ORAL | Status: DC
Start: 2024-01-19 — End: 2024-01-21
  Administered 2024-01-19 – 2024-01-20 (×2): 2.5 mg via ORAL
  Filled 2024-01-19 (×3): qty 1

## 2024-01-19 MED ORDER — METOPROLOL SUCCINATE ER 25 MG PO TB24
12.5000 mg | ORAL_TABLET | Freq: Every day | ORAL | Status: DC
  Administered 2024-01-19 – 2024-01-20 (×2): 12.5 mg via ORAL
  Filled 2024-01-19 (×2): qty 1

## 2024-01-19 MED ORDER — FUROSEMIDE 10 MG/ML IJ SOLN
60.0000 mg | Freq: Once | INTRAMUSCULAR | Status: AC
  Administered 2024-01-19: 60 mg via INTRAVENOUS
  Filled 2024-01-19: qty 8

## 2024-01-19 MED ORDER — FUROSEMIDE 10 MG/ML IJ SOLN
40.0000 mg | Freq: Two times a day (BID) | INTRAMUSCULAR | Status: DC
  Administered 2024-01-19 – 2024-01-21 (×5): 40 mg via INTRAVENOUS
  Filled 2024-01-19 (×5): qty 4

## 2024-01-19 MED ORDER — ONDANSETRON HCL 4 MG/2ML IJ SOLN
4.0000 mg | Freq: Four times a day (QID) | INTRAMUSCULAR | Status: DC | PRN
  Filled 2024-01-19: qty 2

## 2024-01-19 MED ORDER — ONDANSETRON 4 MG PO TBDP
4.0000 mg | ORAL_TABLET | Freq: Three times a day (TID) | ORAL | Status: DC | PRN
  Administered 2024-01-20: 4 mg via ORAL
  Filled 2024-01-19: qty 1

## 2024-01-19 MED ORDER — LOSARTAN POTASSIUM 25 MG PO TABS
12.5000 mg | ORAL_TABLET | Freq: Every day | ORAL | Status: DC
Start: 2024-01-19 — End: 2024-01-21
  Administered 2024-01-20 – 2024-01-21 (×2): 12.5 mg via ORAL
  Filled 2024-01-19 (×2): qty 1
  Filled 2024-01-19: qty 0.5

## 2024-01-19 MED ORDER — INSULIN ASPART 100 UNIT/ML IJ SOLN
0.0000 [IU] | Freq: Three times a day (TID) | INTRAMUSCULAR | Status: DC
  Administered 2024-01-19 – 2024-01-20 (×2): 1 [IU] via SUBCUTANEOUS
  Administered 2024-01-20 (×2): 2 [IU] via SUBCUTANEOUS
  Administered 2024-01-21: 1 [IU] via SUBCUTANEOUS
  Administered 2024-01-21 (×2): 2 [IU] via SUBCUTANEOUS
  Administered 2024-01-22: 1 [IU] via SUBCUTANEOUS
  Filled 2024-01-19 (×6): qty 1

## 2024-01-19 MED ORDER — SPIRONOLACTONE 25 MG PO TABS
25.0000 mg | ORAL_TABLET | Freq: Every day | ORAL | Status: DC
  Administered 2024-01-19 – 2024-01-22 (×4): 25 mg via ORAL
  Filled 2024-01-19 (×4): qty 1

## 2024-01-19 MED ORDER — SODIUM CHLORIDE 0.9 % IV SOLN: 250.0000 mL | INTRAVENOUS | Status: AC | PRN

## 2024-01-19 NOTE — H&P (Signed)
 History and Physical    Jermaine Kramer:[id number] DOB: [birth year] DOA: 01/19/2024  PCP: Perri Constance Sor, PA-C (Confirm with patient/family/NH records and if not entered, this has to be entered at [facility] point of entry) Patient coming from: Home  I have personally briefly reviewed patient's old medical records in [facility]  Chief Complaint: Shortness of breath, peripheral edema  HPI: Jermaine Kramer is a 73 y.o. male with medical history significant of CAD, chronic HFrEF with LVEF 20 to 25%, IIDM, HTN, HLD, CKD stage IIIb, BPH, PPM, presented with worsening of shortness of breath and peripheral edema.  Symptoms started about 7 to 8 days ago, patient started to feel increasing exertional dyspnea and ankle edema.  As a result he started to double his torsemide  dosage from 20 mg daily to 20 mg twice daily for last 7 days, she saw some improvement initially but continued to gain weight and saw increasing abdominal girth and gained 5 pounds over the last 3 days and decided to come to the hospital.  Last 2 nights he also started to experience orthopnea.  Denied any chest pains, he does have occasional dry cough but no fever or chills.  ED Course: Afebrile, nontachycardic blood pressure 96/65 O2 saturation 100% on room air, chest x-ray showed pulmonary congestion, and blood work showed hemoglobin 12.4 WBC 5.1 BUN 26 creatinine 2.0 compared to baseline 1.8-2.0 K4.4 bicarb 22.    Patient was given IV Lasix  60 mg x 1 in the ED.  Review of Systems: As per HPI otherwise 14 point review of systems negative.    Past Medical History:  Diagnosis Date   Arrhythmia    atrial fibrillation   CHF (congestive heart failure) (HCC)    Coronary artery disease    Diabetes mellitus without complication (HCC)    GERD (gastroesophageal reflux disease)    Hypercholesteremia    Hypertension    Sleep apnea    Stroke [facility])    2015 or longer ago    Past Surgical History:  Procedure Laterality Date    CATARACT EXTRACTION Bilateral 04/06/2022   CCM GENERATOR AND A/V LEAD INSERTION N/A 09/04/2022   Procedure: CCM GENERATOR AND A/V LEAD INSERTION;  Surgeon: Cindie Ole DASEN, MD;  Location: [facility] INVASIVE CV LAB;  Service: Cardiovascular;  Laterality: N/A;   CHOLECYSTECTOMY     COLONOSCOPY WITH PROPOFOL  N/A 04/14/2019   Procedure: COLONOSCOPY WITH PROPOFOL ;  Surgeon: Toledo, Ladell POUR, MD;  Location: [facility] ENDOSCOPY;  Service: Gastroenterology;  Laterality: N/A;   CORONARY ARTERY BYPASS GRAFT  02/16/2021   ICD IMPLANT N/A 06/22/2021   Procedure: ICD IMPLANT;  Surgeon: Cindie Ole DASEN, MD;  Location: [facility] INVASIVE CV LAB;  Service: Cardiovascular;  Laterality: N/A;   KNEE ARTHROSCOPY     RENAL CYST EXCISION       reports that he has never smoked. He has never used smokeless tobacco. He reports that he does not currently use alcohol. He reports that he does not use drugs.  Allergies  Allergen Reactions   Doxycycline Anaphylaxis    Patient does not recall having this reaction.     Heparin Other (See Comments)    heparin-induced thrombocytopenia    Empagliflozin Itching    Groin infection Caused a groin infection.      Family History  Problem Relation Age of Onset   Parkinson's disease Mother    Lupus Mother    Heart disease Father      Prior to Admission medications   Medication  Sig Start Date End Date Taking? Authorizing Provider  apixaban  (ELIQUIS ) 5 MG TABS tablet Take 1 tablet (5 mg total) by mouth 2 (two) times daily. 11/28/21  Yes Hackney, Ellouise A, FNP  atorvastatin  (LIPITOR ) 80 MG tablet Take 1 tablet (80 mg total) by mouth daily. 01/10/22  Yes Hackney, Ellouise A, FNP  Furosemide  (FUROSCIX ) 80 MG/10ML CTKT Inject 80 mg into the skin daily for 2 days. Patient taking differently: Inject 80 mg into the skin as needed. 12/03/23 01/19/24 Yes Clegg, Amy D, NP  losartan  (COZAAR ) 25 MG tablet TAKE 1/2 TABLET BY MOUTH DAILY 08/29/23  Yes Donette Ellouise A, FNP  metFORMIN  (GLUCOPHAGE ) 500 MG  tablet Take 500 mg by mouth 2 (two) times daily.   Yes [provider]  metolazone  (ZAROXOLYN ) 2.5 MG tablet Take 1 tablet (2.5 mg total) by mouth daily. 12/13/23 03/12/24 Yes Donette Ellouise A, FNP  omeprazole (PRILOSEC) 40 MG capsule Take 40 mg by mouth 2 (two) times daily. 03/11/21  Yes [provider]  ondansetron  (ZOFRAN -ODT) 4 MG disintegrating tablet Take 1 tablet (4 mg total) by mouth every 8 (eight) hours as needed for nausea or vomiting. 04/16/23  Yes Willo Dunnings, MD  Semaglutide,0.25 or 0.5MG /DOS, 2 MG/3ML SOPN Inject 0.25 mg into the skin once a week. 05/28/23  Yes [provider]  spironolactone  (ALDACTONE ) 25 MG tablet TAKE 1 TABLET (25 MG TOTAL) BY MOUTH DAILY. 12/24/23  Yes Riddle, Suzann, NP  tamsulosin  (FLOMAX ) 0.4 MG CAPS capsule Take 0.4 mg by mouth in the morning and at bedtime.   Yes [provider]  torsemide  (DEMADEX ) 20 MG tablet Take 1 tablet (20 mg total) by mouth 2 (two) times daily. Patient taking differently: Take 20 mg by mouth daily. 12/07/23  Yes Donette Ellouise A, FNP  calcitRIOL (ROCALTROL) 0.25 MCG capsule Take 0.25 mcg by mouth. 12/17/23 12/16/24  [provider]  Continuous Glucose Sensor (FREESTYLE LIBRE 2 SENSOR) MISC by Does not apply route.    [provider]  Insulin  Pen Needle (COMFORT EZ PEN NEEDLES) 32G X 8 MM MISC by Other route. 07/16/23 07/15/24  [provider]  metoprolol  succinate (TOPROL -XL) 25 MG 24 hr tablet TAKE 1/2 TABLET BY MOUTH EVERY EVENING 06/25/23   Donette Ellouise LABOR, FNP    Physical Exam: Vitals:   01/19/24 1000 01/19/24 1030 01/19/24 1100 01/19/24 1130  BP: 99/75 100/76 92/77 (!) 85/72  Pulse: 91 90 89 90  Resp: 20 (!) 28 20 (!) 26  Temp:    97.6 F (36.4 C)  TempSrc:    Oral  SpO2: 94% 95% 99% 97%  Weight:      Height:        Constitutional: NAD, calm, comfortable Vitals:   01/19/24 1000 01/19/24 1030 01/19/24 1100 01/19/24 1130  BP: 99/75 100/76 92/77 (!) 85/72   Pulse: 91 90 89 90  Resp: 20 (!) 28 20 (!) 26  Temp:    97.6 F (36.4 C)  TempSrc:    Oral  SpO2: 94% 95% 99% 97%  Weight:      Height:       Eyes: PERRL, lids and conjunctivae normal ENMT: Mucous membranes are moist. Posterior pharynx clear of any exudate or lesions.Normal dentition.  Neck: normal, supple, no masses, no thyromegaly Respiratory: clear to auscultation bilaterally, no wheezing, fine crackles on bilateral lower fields, increasing respiratory effort. No accessory muscle use.  Cardiovascular: Regular rate and rhythm, no murmurs / rubs / gallops.  1+ extremity edema. 2+ pedal pulses.  No carotid bruits.  Abdomen: no tenderness, no masses palpated. No hepatosplenomegaly. Bowel sounds positive.  Musculoskeletal: no clubbing / cyanosis. No joint deformity upper and lower extremities. Good ROM, no contractures. Normal muscle tone.  Skin: no rashes, lesions, ulcers. No induration Neurologic: CN 2-12 grossly intact. Sensation intact, DTR normal. Strength 5/5 in all 4.  Psychiatric: Normal judgment and insight. Alert and oriented x 3. Normal mood.    Labs on Admission: I have personally reviewed following labs and imaging studies  CBC: Recent Labs  Lab 01/19/24 0721  WBC 5.1  HGB 12.4*  HCT 38.6*  MCV 85.0  PLT 164   Basic Metabolic Panel: Recent Labs  Lab 01/19/24 0721  NA 133*  K 4.4  CL 99  CO2 22  GLUCOSE 155*  BUN 26*  CREATININE 2.04*  CALCIUM  8.8*   GFR: Estimated Creatinine Clearance: 32 mL/min (A) (by C-G formula based on SCr of 2.04 mg/dL (H)). Liver Function Tests: No results for input(s): AST, ALT, ALKPHOS, BILITOT, PROT, ALBUMIN in the last 168 hours. No results for input(s): LIPASE, AMYLASE in the last 168 hours. No results for input(s): AMMONIA in the last 168 hours. Coagulation Profile: No results for input(s): INR, PROTIME in the last 168 hours. Cardiac Enzymes: No results for input(s): CKTOTAL, CKMB, CKMBINDEX,  TROPONINI in the last 168 hours. BNP (last 3 results) No results for input(s): PROBNP in the last 8760 hours. HbA1C: No results for input(s): HGBA1C in the last 72 hours. CBG: Recent Labs  Lab 01/19/24 1144  GLUCAP 150*   Lipid Profile: No results for input(s): CHOL, HDL, LDLCALC, TRIG, CHOLHDL, LDLDIRECT in the last 72 hours. Thyroid Function Tests: No results for input(s): TSH, T4TOTAL, FREET4, T3FREE, THYROIDAB in the last 72 hours. Anemia Panel: No results for input(s): VITAMINB12, FOLATE, FERRITIN, TIBC, IRON, RETICCTPCT in the last 72 hours. Urine analysis:    Component Value Date/Time   COLORURINE YELLOW (A) 04/05/2023 1925   APPEARANCEUR CLEAR (A) 04/05/2023 1925   APPEARANCEUR CLEAR 01/15/2013 1659   LABSPEC 1.011 04/05/2023 1925   LABSPEC 1.005 01/15/2013 1659   PHURINE 5.0 04/05/2023 1925   GLUCOSEU NEGATIVE 04/05/2023 1925   GLUCOSEU NEGATIVE 01/15/2013 1659   HGBUR SMALL (A) 04/05/2023 1925   BILIRUBINUR NEGATIVE 04/05/2023 1925   BILIRUBINUR NEGATIVE 01/15/2013 1659   KETONESUR NEGATIVE 04/05/2023 1925   PROTEINUR NEGATIVE 04/05/2023 1925   NITRITE NEGATIVE 04/05/2023 1925   LEUKOCYTESUR TRACE (A) 04/05/2023 1925   LEUKOCYTESUR 1+ 01/15/2013 1659    Radiological Exams on Admission: DG Chest 2 View Result Date: 01/19/2024 CLINICAL DATA:  73 year old male with shortness of breath and unintentional weight gain. Chest tightness. EXAM: CHEST - 2 VIEW COMPARISON:  Chest radiographs 09/04/2022 and earlier. FINDINGS: PA and lateral views 0732 hours. Bilateral cardiac pacemaker/ICD are stable. Prior CABG. Mild cardiomegaly. Stable to mildly improved lung volumes from last year. No pneumothorax. No pulmonary edema. No convincing pleural effusion or acute lung opacity. Streaky mild infrahilar opacity appears to be chronic, stable since 2023. Hyperostosis related thoracic spinal ankylosis. No acute osseous abnormality identified. Small  surgical clips in the visible upper abdomen. Nonobstructed bowel-gas pattern. IMPRESSION: 1. No acute cardiopulmonary abnormality. 2. Chronic cardiac pacemakers/ICD. Stable mild cardiomegaly. Prior CABG. Electronically Signed   By: VEAR Hurst M.D.   On: 01/19/2024 07:52    EKG: Independently reviewed.  Sinus rhythm, chronic RBBB, no acute ST changes.  Assessment/Plan Principal Problem:   CHF (congestive heart failure) (HCC) Active Problems:   Acute CHF (congestive  heart failure) (HCC)   HFrEF (heart failure with reduced ejection fraction) (HCC)  (please populate well all problems here in Problem List. (For example, if patient is on BP meds at home and you resume or decide to hold them, it is a problem that needs to be her. Same for CAD, COPD, HLD and so on)  Acute on chronic HFrEF decompensation - Failed outpatient management - Continue IV Lasix  40 mg twice daily, monitoring kidney function response, continue metolazone  - Echocardiogram was done within the year, will not repeat echocardiogram at this time.  CKD stage IIIb - Creatinine level stable, significant volume overloaded - IV diuresis as above  Hyponatremia - Hypervolemic, secondary to CHF, management as above  PAF - In sinus rhythm - Continue beta-blocker, continue Eliquis   IIDM - Hold off metformin  - Start SSI  BPH -Continue Flomax   DVT prophylaxis: Eliquis  Code Status: Full code  Family Communication: Wife at bedside Disposition Plan: Patient sick with CHF decompensation failed outpatient management requiring IV diuresis and complicated by CKD stage IIIb which requiring close monitoring kidney function responded to IV diuresis, expect more than 2 midnight hospital stay Consults called: None Admission status: Telemetry admission   Cort ONEIDA Mana MD [facility] Pager [phone]  01/19/2024, 12:45 PM

## 2024-01-19 NOTE — Plan of Care (Signed)

## 2024-01-19 NOTE — ED Provider Notes (Signed)
 [facility] Provider Note    Event Date/Time   First MD Initiated Contact with Patient 01/19/24 0730     (approximate)   History   Shortness of Breath and Chest Pain   HPI  Jermaine Kramer is a 73 y.o. male with a history of CHF, CAD, diabetes, hypertension who presents with complaints of shortness of breath and chest tightness ongoing over the last 24 hours or so.  He has gained 4 pounds as well.  He reports most of his weight gain is in his waist which is typical for him but he also has some fluid in his legs and elbows.  Reports compliance with his torsemide , follows with the CHF clinic, has seen [facility] cardiology     Physical Exam   Triage Vital Signs: ED Triage Vitals  Encounter Vitals Group     BP 01/19/24 0721 91/74     Girls Systolic BP Percentile --      Girls Diastolic BP Percentile --      Boys Systolic BP Percentile --      Boys Diastolic BP Percentile --      Pulse Rate 01/19/24 0721 96     Resp 01/19/24 0721 (!) 24     Temp 01/19/24 0721 (!) 97 F (36.1 C)     Temp Source 01/19/24 0721 Axillary     SpO2 01/19/24 0721 96 %     Weight 01/19/24 0719 84.8 kg (187 lb)     Height 01/19/24 0719 1.676 m (5' 6)     Head Circumference --      Peak Flow --      Pain Score 01/19/24 0718 5     Pain Loc --      Pain Education --      Exclude from Growth Chart --     Most recent vital signs: Vitals:   01/19/24 0740 01/19/24 0744  BP: 96/65   Pulse: 84   Resp: (!) 25   Temp: 97.6 F (36.4 C)   SpO2: 100% 100%     General: Awake,  CV:  Good peripheral perfusion.  Resp:  Tachypnea, bibasilar rales, no wheezing Abd:  No distention.  Fluid in the abdomen, right elbow, 1+ edema bilaterally to the level of the knees Other:     ED Results / Procedures / Treatments   Labs (all labs ordered are listed, but only abnormal results are displayed) Labs Reviewed  BASIC METABOLIC PANEL WITH GFR - Abnormal; Notable for the following  components:      Result Value   Sodium 133 (*)    Glucose, Bld 155 (*)    BUN 26 (*)    Creatinine, Ser 2.04 (*)    Calcium  8.8 (*)    GFR, Estimated 33 (*)    All other components within normal limits  CBC - Abnormal; Notable for the following components:   Hemoglobin 12.4 (*)    HCT 38.6 (*)    All other components within normal limits  BRAIN NATRIURETIC PEPTIDE - Abnormal; Notable for the following components:   B Natriuretic Peptide 1,161.0 (*)    All other components within normal limits  TROPONIN I (HIGH SENSITIVITY) - Abnormal; Notable for the following components:   Troponin I (High Sensitivity) 28 (*)    All other components within normal limits  RESP PANEL BY RT-PCR (RSV, FLU A&B, COVID)  [id number]     EKG  ED ECG REPORT I, Lamar Price, the attending physician, personally  viewed and interpreted this ECG.  Date: 01/19/2024  Rhythm: normal sinus rhythm QRS Axis: normal Intervals: Right bundle branch block ST/T Wave abnormalities: Nonspecific changes Narrative Interpretation: no evidence of acute ischemia    RADIOLOGY Chest x-ray without overt edema but vascular congestion    PROCEDURES:  Critical Care performed:   Procedures   MEDICATIONS ORDERED IN ED: Medications  furosemide  (LASIX ) injection 60 mg (has no administration in time range)     IMPRESSION / MDM / ASSESSMENT AND PLAN / ED COURSE  I reviewed the triage vital signs and the nursing notes. Patient's presentation is most consistent with severe exacerbation of chronic illness.  Patient presents with 4 pound weight gain, tachypnea, shortness of breath with any exertion especially but also while sitting in bed.  Reports compliance with his medications.  Lab work is notable for troponin which is elevated at 28 and significantly elevated BNP of 1600.  Does have a history of CKD.  Chest x-ray without overt edema but vascular congestion and presentation is consistent with CHF exacerbation.  Will  start the patient on IV Lasix .  Will consult with the hospitalist for admission        FINAL CLINICAL IMPRESSION(S) / ED DIAGNOSES   Final diagnoses:  Acute on chronic congestive heart failure, unspecified heart failure type (HCC)     Rx / DC Orders   ED Discharge Orders     None        Note:  This document was prepared using Dragon voice recognition software and may include unintentional dictation errors.   Arlander Charleston, MD 01/19/24 [phone]

## 2024-01-19 NOTE — ED Triage Notes (Signed)
 Pt to ED with wife for SOB since last night. Hx CHF and has gained 4# since yesterday AM. Also chest tightness since last night. Hx CABG in 2022. Pt is tachypneic.

## 2024-01-19 NOTE — TOC CM/SW Note (Signed)
..  Transition of Care [facility]) - Inpatient Brief Assessment   Patient Details  Name: Jermaine Kramer MRN: [id number] Date of Birth: [birth year]  [facility] of [facility]) CM/SW Contact:    Edsel DELENA Fischer, LCSW Phone Number: 01/19/2024, 10:02 AM   Clinical Narrative:  TOC to handoff to heart failure team  Transition of Care Asessment:

## 2024-01-20 DIAGNOSIS — I5020 Unspecified systolic (congestive) heart failure: Secondary | ICD-10-CM | POA: Diagnosis not present

## 2024-01-20 DIAGNOSIS — I5021 Acute systolic (congestive) heart failure: Secondary | ICD-10-CM | POA: Diagnosis not present

## 2024-01-20 LAB — BASIC METABOLIC PANEL WITH GFR
Anion gap: 9 (ref 5–15)
BUN: 24 mg/dL — ABNORMAL HIGH (ref 8–23)
CO2: 24 mmol/L (ref 22–32)
Calcium: 8.8 mg/dL — ABNORMAL LOW (ref 8.9–10.3)
Chloride: 101 mmol/L (ref 98–111)
Creatinine, Ser: 1.96 mg/dL — ABNORMAL HIGH (ref 0.61–1.24)
GFR, Estimated: 35 mL/min — ABNORMAL LOW (ref >60)
Glucose, Bld: 106 mg/dL — ABNORMAL HIGH (ref 70–99)
Potassium: 3.5 mmol/L (ref 3.5–5.1)
Sodium: 134 mmol/L — ABNORMAL LOW (ref 135–145)

## 2024-01-20 LAB — GLUCOSE, CAPILLARY
Glucose-Capillary: 128 mg/dL — ABNORMAL HIGH (ref 70–99)
Glucose-Capillary: 196 mg/dL — ABNORMAL HIGH (ref 70–99)
Glucose-Capillary: 155 mg/dL — ABNORMAL HIGH (ref 70–99)
Glucose-Capillary: 122 mg/dL — ABNORMAL HIGH (ref 70–99)

## 2024-01-20 LAB — HEMOGLOBIN A1C
Hgb A1c MFr Bld: 7.1 % — ABNORMAL HIGH (ref 4.8–5.6)
Mean Plasma Glucose: 157.07 mg/dL

## 2024-01-20 NOTE — Progress Notes (Signed)
  Progress Note   Patient: Jermaine Kramer FMW:[id number] DOB: [birth year] DOA: 01/19/2024     1 DOS: the patient was seen and examined on 01/20/2024   Brief hospital course: Jermaine Kramer is a 73 y.o. male with medical history significant of CAD, chronic HFrEF with LVEF 20 to 25%, IIDM, HTN, HLD, CKD stage IIIb, BPH, PPM, presented with worsening of shortness of breath and peripheral edema.  See H&P for full HPI on admission & ED course.  Pt was admitted on 01/19/24 for further evaluation and management of acute on chronic HFrEF and started on IV Lasix  for diuresis.   Cons[facility]: [facility]   Assessment and Plan:  Acute on chronic HFrEF decompensation - Failed outpatient management with increased diuretics.   Pt follows with [facility] - consulted. - Continue IV Lasix  40 mg BID - Continue metolazone  - Monitoring renal function response & electrolytes - Strict Io's & daily weights - Echocardiogram was done within the year, repeat not ordered on admission   CKD stage IIIb - Creatinine level stable, significant volume overloaded - IV diuresis as above   Hyponatremia - hypervolemic. Expect improvement with diuresis. - Monitor BMP   PAF - In sinus rhythm - Continue beta-blocker, Eliquis  - Telemetry   IIDM - Hold off metformin  - Start SSI   BPH -Continue Flomax       Subjective: Pt seen awake resting in bed, s/o at bedside. He reports feeling better since admission, dyspnea significantly improved today.  Denies other acute complaints.    Physical Exam: Vitals:   01/20/24 0333 01/20/24 0500 01/20/24 0738 01/20/24 1153  BP: 94/69  99/71 98/67  Pulse:   71 91  Resp: 20  16   Temp: 98.2 F (36.8 C)  98.6 F (37 C) 98 F (36.7 C)  TempSrc: Oral  Oral Oral  SpO2: 97%  96% 91%  Weight:  81.2 kg    Height:       General exam: awake, alert, no acute distress HEENT: moist mucus membranes, hearing grossly normal  Respiratory system: CTAB diminished bases, no wheezes,  rales or rhonchi, normal respiratory effort. Cardiovascular system: normal S1/S2, RRR, no peripheral edema.   Gastrointestinal system: distended, non-tender Central nervous system: A&O x3. no gross focal neurologic deficits, normal speech Skin: dry, intact, normal temperature Psychiatry: normal mood, congruent affect, judgement and insight appear normal   Data Reviewed:  Notable labs -- Na 134, glucose 106, BUN 24, Cr 2.04 >> 1.96, Ca 8.8  A1c 7.1 improved from 8.4 one year ago  Family Communication: at bedside on rounds  Disposition: Status is: Inpatient Remains inpatient appropriate because: remains on IV diuresis. D/C pending cardiology's clearance   Planned Discharge Destination: Home    Time spent: 42 minutes  Author: Burnard DELENA Cunning, DO 01/20/2024 2:11 PM  For on call review [email or web address].

## 2024-01-20 NOTE — Plan of Care (Signed)
  Problem: Coping: Goal: Ability to adjust to condition or change in health will improve Outcome: Progressing   Problem: Fluid Volume: Goal: Ability to maintain a balanced intake and output will improve Outcome: Progressing   Problem: Health Behavior/Discharge Planning: Goal: Ability to identify and utilize available resources and services will improve Outcome: Progressing Goal: Ability to manage health-related needs will improve Outcome: Progressing   Problem: Metabolic: Goal: Ability to maintain appropriate glucose levels will improve Outcome: Progressing   Problem: Nutritional: Goal: Maintenance of adequate nutrition will improve Outcome: Progressing   Problem: Skin Integrity: Goal: Risk for impaired skin integrity will decrease Outcome: Progressing

## 2024-01-20 NOTE — Consult Note (Signed)
 [facility] CLINIC CARDIOLOGY CONSULT NOTE       Patient ID: Jermaine Kramer MRN: [id number] DOB/AGE: [birth year] 73 y.o.  Admit date: 01/19/2024 Referring Physician Dr Tobie Primary Physician Perri, Mychal Burnard, PA-C Primary Cardiologist Dr Ammon Reason for Consultation AECHF  HPI: Jermaine Kramer is a 73 y.o. male  with a past medical history of CAD, chronic HFrEF with LVEF 20 to 25%, IIDM, HTN, HLD, CKD stage IIIb, BPH, PPM, presented with worsening of shortness of breath and peripheral edema. This morning, patient is feeling much better and states he has diuresed at least ten pounds so far. He does still feel like he has a lot of fluid in his abdomen but significantly improved compared to when he first came in.  Review of systems complete and found to be negative unless listed above     Past Medical History:  Diagnosis Date   Arrhythmia    atrial fibrillation   CHF (congestive heart failure) (HCC)    Coronary artery disease    Diabetes mellitus without complication (HCC)    GERD (gastroesophageal reflux disease)    Hypercholesteremia    Hypertension    Sleep apnea    Stroke [facility])    2015 or longer ago    Past Surgical History:  Procedure Laterality Date   CATARACT EXTRACTION Bilateral 04/06/2022   CCM GENERATOR AND A/V LEAD INSERTION N/A 09/04/2022   Procedure: CCM GENERATOR AND A/V LEAD INSERTION;  Surgeon: Cindie Ole DASEN, MD;  Location: [facility] INVASIVE CV LAB;  Service: Cardiovascular;  Laterality: N/A;   CHOLECYSTECTOMY     COLONOSCOPY WITH PROPOFOL  N/A 04/14/2019   Procedure: COLONOSCOPY WITH PROPOFOL ;  Surgeon: Toledo, Ladell POUR, MD;  Location: [facility] ENDOSCOPY;  Service: Gastroenterology;  Laterality: N/A;   CORONARY ARTERY BYPASS GRAFT  02/16/2021   ICD IMPLANT N/A 06/22/2021   Procedure: ICD IMPLANT;  Surgeon: Cindie Ole DASEN, MD;  Location: [facility] INVASIVE CV LAB;  Service: Cardiovascular;  Laterality: N/A;   KNEE ARTHROSCOPY     RENAL CYST EXCISION       Medications Prior to Admission  Medication Sig Dispense Refill Last Dose/Taking   apixaban  (ELIQUIS ) 5 MG TABS tablet Take 1 tablet (5 mg total) by mouth 2 (two) times daily. 180 tablet 3 01/18/2024   atorvastatin  (LIPITOR ) 80 MG tablet Take 1 tablet (80 mg total) by mouth daily. 30 tablet 5 01/18/2024   Furosemide  (FUROSCIX ) 80 MG/10ML CTKT Inject 80 mg into the skin daily for 2 days. (Patient taking differently: Inject 80 mg into the skin as needed.)   01/18/2024   losartan  (COZAAR ) 25 MG tablet TAKE 1/2 TABLET BY MOUTH DAILY 45 tablet 2 01/18/2024   metFORMIN  (GLUCOPHAGE ) 500 MG tablet Take 500 mg by mouth 2 (two) times daily.   01/18/2024   metolazone  (ZAROXOLYN ) 2.5 MG tablet Take 1 tablet (2.5 mg total) by mouth daily. 10 tablet 3 Taking   omeprazole (PRILOSEC) 40 MG capsule Take 40 mg by mouth 2 (two) times daily.   01/18/2024   ondansetron  (ZOFRAN -ODT) 4 MG disintegrating tablet Take 1 tablet (4 mg total) by mouth every 8 (eight) hours as needed for nausea or vomiting. 12 tablet 0 Taking As Needed   Semaglutide,0.25 or 0.5MG /DOS, 2 MG/3ML SOPN Inject 0.25 mg into the skin once a week.   01/13/2024   spironolactone  (ALDACTONE ) 25 MG tablet TAKE 1 TABLET (25 MG TOTAL) BY MOUTH DAILY. 90 tablet 3 01/18/2024   tamsulosin  (FLOMAX ) 0.4 MG CAPS capsule Take 0.4 mg by mouth  in the morning and at bedtime.   01/18/2024   torsemide  (DEMADEX ) 20 MG tablet Take 1 tablet (20 mg total) by mouth 2 (two) times daily. (Patient taking differently: Take 20 mg by mouth daily.)   01/18/2024   calcitRIOL (ROCALTROL) 0.25 MCG capsule Take 0.25 mcg by mouth.      Continuous Glucose Sensor (FREESTYLE LIBRE 2 SENSOR) MISC by Does not apply route.      Insulin  Pen Needle (COMFORT EZ PEN NEEDLES) 32G X 8 MM MISC by Other route.      metoprolol  succinate (TOPROL -XL) 25 MG 24 hr tablet TAKE 1/2 TABLET BY MOUTH EVERY EVENING 45 tablet 3    Social History   Socioeconomic History   Marital status: Married    Spouse name: Not  on file   Number of children: Not on file   Years of education: Not on file   Highest education level: Not on file  Occupational History   Not on file  Tobacco Use   Smoking status: Never   Smokeless tobacco: Never  Vaping Use   Vaping status: Never Used  Substance and Sexual Activity   Alcohol use: Not Currently   Drug use: No   Sexual activity: Not Currently  Other Topics Concern   Not on file  Social History Narrative   Not on file   Social Drivers of Health   Financial Resource Strain: Low Risk  (10/24/2023)   Received from [facility]   Overall Financial Resource Strain (CARDIA)    Difficulty of Paying Living Expenses: Not hard at all  Food Insecurity: No Food Insecurity (01/19/2024)   Hunger Vital Sign    Worried About Running Out of Food in the Last Year: Never true    Ran Out of Food in the Last Year: Never true  Transportation Needs: No Transportation Needs (01/19/2024)   PRAPARE - Administrator, Civil Service (Medical): No    Lack of Transportation (Non-Medical): No  Physical Activity: Not on file  Stress: Not on file  Social Connections: Moderately Integrated (01/19/2024)   Social Connection and Isolation Panel    Frequency of Communication with Friends and Family: More than three times a week    Frequency of Social Gatherings with Friends and Family: More than three times a week    Attends Religious Services: More than 4 times per year    Active Member of [employer] or Organizations: No    Attends Banker Meetings: Never    Marital Status: Married  Catering manager Violence: Not At Risk (01/19/2024)   Humiliation, Afraid, Rape, and Kick questionnaire    Fear of Current or Ex-Partner: No    Emotionally Abused: No    Physically Abused: No    Sexually Abused: No    Family History  Problem Relation Age of Onset   Parkinson's disease Mother    Lupus Mother    Heart disease Father      Vitals:   01/19/24 2243 01/20/24  0333 01/20/24 0500 01/20/24 0738  BP:  94/69  99/71  Pulse:    71  Resp:  20  16  Temp:  98.2 F (36.8 C)  98.6 F (37 C)  TempSrc:  Oral  Oral  SpO2:  97%  96%  Weight: 82.4 kg  81.2 kg   Height: 5' 5 (1.651 m)       PHYSICAL EXAM General: awake, well nourished, in no acute distress. HEENT: Normocephalic and atraumatic. Neck: No JVD.  Lungs: Normal respiratory effort. Clear bilaterally to auscultation. No wheezes, crackles, rhonchi.  Heart: HRRR. Normal S1 and S2 without gallops or murmurs.  Abdomen: Non-distended appearing.  Msk: Normal strength and tone for age. Extremities: Warm and well perfused. No clubbing, cyanosis. No peripheral edema.  Neuro: Alert and oriented X 3. Psych: Answers questions appropriately.   Labs: Basic Metabolic Panel: Recent Labs    01/19/24 0721 01/20/24 0422  NA 133* 134*  K 4.4 3.5  CL 99 101  CO2 22 24  GLUCOSE 155* 106*  BUN 26* 24*  CREATININE 2.04* 1.96*  CALCIUM  8.8* 8.8*   Liver Function Tests: No results for input(s): AST, ALT, ALKPHOS, BILITOT, PROT, ALBUMIN in the last 72 hours. No results for input(s): LIPASE, AMYLASE in the last 72 hours. CBC: Recent Labs    01/19/24 0721  WBC 5.1  HGB 12.4*  HCT 38.6*  MCV 85.0  PLT 164   Cardiac Enzymes: Recent Labs    01/19/24 0721 01/19/24 0928  TROPONINIHS 28* 21*   BNP: Recent Labs    01/19/24 0721  BNP 1,161.0*   D-Dimer: No results for input(s): DDIMER in the last 72 hours. Hemoglobin A1C: Recent Labs    01/19/24 1142  HGBA1C 7.1*   Fasting Lipid Panel: No results for input(s): CHOL, HDL, LDLCALC, TRIG, CHOLHDL, LDLDIRECT in the last 72 hours. Thyroid Function Tests: No results for input(s): TSH, T4TOTAL, T3FREE, THYROIDAB in the last 72 hours.  Invalid input(s): FREET3 Anemia Panel: No results for input(s): VITAMINB12, FOLATE, FERRITIN, TIBC, IRON, RETICCTPCT in the last 72 hours.   Radiology: US   ASCITES (ABDOMEN LIMITED) Result Date: 01/19/2024 CLINICAL DATA:  Congestive heart failure.  Previous cholecystectomy. EXAM: LIMITED ABDOMEN ULTRASOUND FOR ASCITES TECHNIQUE: Limited ultrasound survey for ascites was performed in all four abdominal quadrants. COMPARISON:  CT abdomen 12/22/2022 FINDINGS: Minimal ascites over the right upper and right lower quadrants with trace free fluid adjacent the spleen in the left upper quadrant. No significant free fluid in the left lower quadrant this free fluid is new since the previous CT of August 2024. IMPRESSION: Minimal ascites as described. Electronically Signed   By: Toribio Agreste M.D.   On: 01/19/2024 15:02   DG Chest 2 View Result Date: 01/19/2024 CLINICAL DATA:  73 year old male with shortness of breath and unintentional weight gain. Chest tightness. EXAM: CHEST - 2 VIEW COMPARISON:  Chest radiographs 09/04/2022 and earlier. FINDINGS: PA and lateral views 0732 hours. Bilateral cardiac pacemaker/ICD are stable. Prior CABG. Mild cardiomegaly. Stable to mildly improved lung volumes from last year. No pneumothorax. No pulmonary edema. No convincing pleural effusion or acute lung opacity. Streaky mild infrahilar opacity appears to be chronic, stable since 2023. Hyperostosis related thoracic spinal ankylosis. No acute osseous abnormality identified. Small surgical clips in the visible upper abdomen. Nonobstructed bowel-gas pattern. IMPRESSION: 1. No acute cardiopulmonary abnormality. 2. Chronic cardiac pacemakers/ICD. Stable mild cardiomegaly. Prior CABG. Electronically Signed   By: VEAR Hurst M.D.   On: 01/19/2024 07:52   CUP PACEART INCLINIC DEVICE CHECK Result Date: 12/24/2023 Normal in-clinic _dual__ chamber ICD check. Presenting Rhythm: _AS-VS__ . Routine testing was performed. Thresholds, sensing, and impedance demonstrate stable parameters and no programming changes needed. No treated arrhythmias. Estimated longevity _7.6 years___ . Pt enrolled in remote  follow-up. CANDIE Needle, NP   ECHO pending  TELEMETRY reviewed by me [facility]) 01/20/2024 : NSR  EKG reviewed by me: NSR with RBBB  Data reviewed by me [facility]) 01/20/2024: last 24h vitals tele labs imaging I/O  provider notes  Principal Problem:   CHF (congestive heart failure) (HCC) Active Problems:   Acute CHF (congestive heart failure) (HCC)   HFrEF (heart failure with reduced ejection fraction) (HCC)    ASSESSMENT AND PLAN:   Acute on chronic HFrEF, currently decompensated Restart home cardiac meds. Continue with IV diuresis. Likely will need 1 or 2 more days of IV diuresis Maintain on telemetry. Strict I&Os, daily weights Optimize lytes, keep Mag>2 and K>4.   Signed: Marriah Sanderlin, DO 01/20/2024, 10:51 AM [facility]

## 2024-01-21 ENCOUNTER — Other Ambulatory Visit (HOSPITAL_COMMUNITY): Payer: Self-pay

## 2024-01-21 DIAGNOSIS — I5021 Acute systolic (congestive) heart failure: Secondary | ICD-10-CM | POA: Diagnosis not present

## 2024-01-21 LAB — MAGNESIUM: Magnesium: 1.8 mg/dL (ref 1.7–2.4)

## 2024-01-21 LAB — BASIC METABOLIC PANEL WITH GFR
Anion gap: 12 (ref 5–15)
BUN: 24 mg/dL — ABNORMAL HIGH (ref 8–23)
CO2: 26 mmol/L (ref 22–32)
Calcium: 9.2 mg/dL (ref 8.9–10.3)
Chloride: 97 mmol/L — ABNORMAL LOW (ref 98–111)
Creatinine, Ser: 1.98 mg/dL — ABNORMAL HIGH (ref 0.61–1.24)
GFR, Estimated: 35 mL/min — ABNORMAL LOW (ref >60)
Glucose, Bld: 119 mg/dL — ABNORMAL HIGH (ref 70–99)
Potassium: 3.8 mmol/L (ref 3.5–5.1)
Sodium: 135 mmol/L (ref 135–145)

## 2024-01-21 LAB — GLUCOSE, CAPILLARY
Glucose-Capillary: 145 mg/dL — ABNORMAL HIGH (ref 70–99)
Glucose-Capillary: 161 mg/dL — ABNORMAL HIGH (ref 70–99)
Glucose-Capillary: 176 mg/dL — ABNORMAL HIGH (ref 70–99)
Glucose-Capillary: 124 mg/dL — ABNORMAL HIGH (ref 70–99)

## 2024-01-21 MED ORDER — TRAZODONE HCL 50 MG PO TABS
25.0000 mg | ORAL_TABLET | Freq: Every evening | ORAL | Status: DC | PRN
  Administered 2024-01-21: 25 mg via ORAL
  Filled 2024-01-21: qty 1

## 2024-01-21 MED ORDER — METOLAZONE 2.5 MG PO TABS: 2.5000 mg | ORAL_TABLET | Freq: Every day | ORAL | Status: DC | PRN

## 2024-01-21 NOTE — Care Management Important Message (Signed)
 Important Message  Patient Details  Name: Jermaine Kramer MRN: [id number] Date of Birth: Aug 03, 1950   Important Message Given:  Yes - Medicare IM     Jermaine Kramer 01/21/2024, 5:01 PM

## 2024-01-21 NOTE — Progress Notes (Signed)
 Heart Failure Stewardship Pharmacy Note  PCP: Perri Constance Sor, PA-C PCP-Cardiologist: -  HPI: Jermaine Kramer is a 73 y.o. male with CAD, chronic HFrEF, T2DM, HTN, HLD, CKD stage IIIb, BPH, PPM  who presented with shortness of breath and peripheral edema. On admission, BNP was 1161, HS-troponin was 28, and A1c 7.1. Chest x-ray noted no acute abnormality.    Pertinent cardiac history: Underwent CABG 2022. TTE 05/2021 with LVEF <20%, G1DD. ICD placed 06/2021. TTE 08/2021 with LVEF 30-35% and G3DD. TTE 06/2022 with LVEF 20-25%, G1DD, mild RV dysfunction. Gen change and A/V lead insertion in 08/2022.  Pertinent Lab Values: Creatinine  Date Value Ref Range Status  10/23/2013 0.95 0.60 - 1.30 mg/dL Final   Creatinine, Ser  Date Value Ref Range Status  01/21/2024 1.98 (H) 0.61 - 1.24 mg/dL Final   BUN  Date Value Ref Range Status  01/21/2024 24 (H) 8 - 23 mg/dL Final  93/74/7984 14 7 - 18 mg/dL Final   Potassium  Date Value Ref Range Status  01/21/2024 3.8 3.5 - 5.1 mmol/L Final  10/23/2013 4.0 3.5 - 5.1 mmol/L Final   Sodium  Date Value Ref Range Status  01/21/2024 135 135 - 145 mmol/L Final  10/23/2013 139 136 - 145 mmol/L Final   B Natriuretic Peptide  Date Value Ref Range Status  01/19/2024 1,161.0 (H) 0.0 - 100.0 pg/mL Final    Comment:    Performed at [facility], [address] [place], [place] [address]   Magnesium   Date Value Ref Range Status  01/21/2024 1.8 1.7 - 2.4 mg/dL Final    Comment:    Performed at [facility], [address] [place], [place] [address]   Hgb A1c MFr Bld  Date Value Ref Range Status  01/19/2024 7.1 (H) 4.8 - 5.6 % Final    Comment:    (NOTE) Diagnosis of Diabetes The following HbA1c ranges recommended by the American Diabetes Association (ADA) may be used as an aid in the diagnosis of diabetes mellitus.  Hemoglobin             Suggested A1C NGSP%              Diagnosis  <5.7                   Non  Diabetic  5.7-6.4                Pre-Diabetic  >6.4                   Diabetic  <7.0                   Glycemic control for                       adults with diabetes.      Vital Signs:  Temp:  [97.4 F (36.3 C)-98.6 F (37 C)] 98 F (36.7 C) (09/22 0401) Pulse Rate:  [71-91] 81 (09/22 0401) Cardiac Rhythm: Sinus tachycardia;Bundle branch block (09/21 1900) Resp:  [16-17] 16 (09/22 0401) BP: (91-99)/(65-71) 96/66 (09/22 0401) SpO2:  [91 %-97 %] 96 % (09/22 0401) Weight:  [79.3 kg (174 lb 13.2 oz)] 79.3 kg (174 lb 13.2 oz) (09/22 0500)  Intake/Output Summary (Last 24 hours) at 01/21/2024 0733 Last data filed at 01/20/2024 1923 Gross per 24 hour  Intake 480 ml  Output 1350 ml  Net -870 ml    Current Heart Failure  Medications:  Loop diuretic: furosemide  40 mg IV BID Beta-Blocker: metoprolol  succinate 12.5 mg daily ACEI/ARB/ARNI: losartan  12.5 mg daily MRA: spironolactone  25 mg daily SGLT2i: none Other: none  Prior to admission Heart Failure Medications:  Loop diuretic: torsemide  20 mg daily Beta-Blocker: metoprolol  succinate 12.5 mg daily ACEI/ARB/ARNI: losartan  12.5 mg daily MRA: spironolactone  25 mg daily SGLT2i: none Other: none  Assessment: 1. 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 NYHA class II-III symptoms.  -Symptoms: Reports feeling close to baseline. Denies any shortness of breath, orthopnea, or LEE. Abdominal swelling improved. -Volume: Appears near euvolemic. Can consider transition back to oral torsemide  20 mg daily. -Hemodynamics: BP soft, but asymptomatic.  -BB: Continue metoprolol  succinate 12.5 mg daily -ACEI/ARB/ARNI: Continue losartan  12.5 mg daily. BP too soft to titrate at this time. -MRA: Continue spironolactone  25 mg daily for now. Creatinine is nearing the cutoff. May require transition to Kerendia if CKD has progressed. -SGLT2i: No SGLT2i given hx of penile cancer  and UTI.  Plan: 1) Medication changes recommended at this time: -Consider transition to torsemide  20 mg daily  2) Patient assistance: -Pending  3) Education: - Patient has been educated on current HF medications and potential additions to HF medication regimen - Patient verbalizes understanding that over the next few months, these medication doses may change and more medications may be added to optimize HF regimen - Patient has been educated on basic disease state pathophysiology and goals of therapy  Medication Assistance / Insurance Benefits Check: Does the patient have prescription insurance?    Please do not hesitate to reach out with questions or concerns,  Jaun Bash, PharmD, CPP, BCPS, [facility] Heart Failure Pharmacist  Phone - [phone] 01/21/2024 9:26 AM

## 2024-01-21 NOTE — Progress Notes (Signed)
 [facility] CLINIC CARDIOLOGY PROGRESS NOTE       Patient ID: [name] MRN: [id number] DOB/AGE: Jan 28, 1951 73 y.o.  Admit date: 01/19/2024 Referring Physician Dr Tobie Primary Physician Perri, Mychal Burnard, PA-C Primary Cardiologist Dr Ammon Reason for Consultation AoCHFrEF  HPI: Jermaine Kramer is a 73 y.o. male  with a past medical history of CAD s/p CABG 2022, chronic HFrEF with LVEF 20-25%, ICM s/p ICD, IIDM, HTN, HLD, CKD stage IIIb, BPH, leukemia, hx penile cancer presented with worsening of shortness of breath and peripheral edema. Cardiology was consulted for further evaluation of acute heart failure.   Interval history: -Patient seen and examined this AM, resting in hospital bed.  -Reports he feels much better today, breathing back to baseline and LE edema much improved.  -Renal function stable overall. Continues to report good UOP.   Review of systems complete and found to be negative unless listed above     Past Medical History:  Diagnosis Date   Arrhythmia    atrial fibrillation   CHF (congestive heart failure) (HCC)    Coronary artery disease    Diabetes mellitus without complication (HCC)    GERD (gastroesophageal reflux disease)    Hypercholesteremia    Hypertension    Sleep apnea    Stroke [facility])    2015 or longer ago    Past Surgical History:  Procedure Laterality Date   CATARACT EXTRACTION Bilateral 04/06/2022   CCM GENERATOR AND A/V LEAD INSERTION N/A 09/04/2022   Procedure: CCM GENERATOR AND A/V LEAD INSERTION;  Surgeon: Cindie Ole DASEN, MD;  Location: [facility] INVASIVE CV LAB;  Service: Cardiovascular;  Laterality: N/A;   CHOLECYSTECTOMY     COLONOSCOPY WITH PROPOFOL  N/A 04/14/2019   Procedure: COLONOSCOPY WITH PROPOFOL ;  Surgeon: Toledo, Ladell POUR, MD;  Location: [facility] ENDOSCOPY;  Service: Gastroenterology;  Laterality: N/A;   CORONARY ARTERY BYPASS GRAFT  02/16/2021   ICD IMPLANT N/A 06/22/2021   Procedure: ICD IMPLANT;  Surgeon: Cindie Ole DASEN,  MD;  Location: [facility] INVASIVE CV LAB;  Service: Cardiovascular;  Laterality: N/A;   KNEE ARTHROSCOPY     RENAL CYST EXCISION      Medications Prior to Admission  Medication Sig Dispense Refill Last Dose/Taking   apixaban  (ELIQUIS ) 5 MG TABS tablet Take 1 tablet (5 mg total) by mouth 2 (two) times daily. 180 tablet 3 01/18/2024   atorvastatin  (LIPITOR ) 80 MG tablet Take 1 tablet (80 mg total) by mouth daily. 30 tablet 5 01/18/2024   Furosemide  (FUROSCIX ) 80 MG/10ML CTKT Inject 80 mg into the skin daily for 2 days. (Patient taking differently: Inject 80 mg into the skin as needed.)   01/18/2024   losartan  (COZAAR ) 25 MG tablet TAKE 1/2 TABLET BY MOUTH DAILY 45 tablet 2 01/18/2024   metFORMIN  (GLUCOPHAGE ) 500 MG tablet Take 500 mg by mouth 2 (two) times daily.   01/18/2024   metolazone  (ZAROXOLYN ) 2.5 MG tablet Take 1 tablet (2.5 mg total) by mouth daily. 10 tablet 3 Taking   omeprazole (PRILOSEC) 40 MG capsule Take 40 mg by mouth 2 (two) times daily.   01/18/2024   ondansetron  (ZOFRAN -ODT) 4 MG disintegrating tablet Take 1 tablet (4 mg total) by mouth every 8 (eight) hours as needed for nausea or vomiting. 12 tablet 0 Taking As Needed   Semaglutide,0.25 or 0.5MG /DOS, 2 MG/3ML SOPN Inject 0.25 mg into the skin once a week.   01/13/2024   spironolactone  (ALDACTONE ) 25 MG tablet TAKE 1 TABLET (25 MG TOTAL) BY MOUTH DAILY. 90  tablet 3 01/18/2024   tamsulosin  (FLOMAX ) 0.4 MG CAPS capsule Take 0.4 mg by mouth in the morning and at bedtime.   01/18/2024   torsemide  (DEMADEX ) 20 MG tablet Take 1 tablet (20 mg total) by mouth 2 (two) times daily. (Patient taking differently: Take 20 mg by mouth daily.)   01/18/2024   calcitRIOL (ROCALTROL) 0.25 MCG capsule Take 0.25 mcg by mouth.      Continuous Glucose Sensor (FREESTYLE LIBRE 2 SENSOR) MISC by Does not apply route.      Insulin  Pen Needle (COMFORT EZ PEN NEEDLES) 32G X 8 MM MISC by Other route.      metoprolol  succinate (TOPROL -XL) 25 MG 24 hr tablet TAKE 1/2 TABLET  BY MOUTH EVERY EVENING 45 tablet 3    Social History   Socioeconomic History   Marital status: Married    Spouse name: Not on file   Number of children: Not on file   Years of education: Not on file   Highest education level: Not on file  Occupational History   Not on file  Tobacco Use   Smoking status: Never   Smokeless tobacco: Never  Vaping Use   Vaping status: Never Used  Substance and Sexual Activity   Alcohol use: Not Currently   Drug use: No   Sexual activity: Not Currently  Other Topics Concern   Not on file  Social History Narrative   Not on file   Social Drivers of Health   Financial Resource Strain: Low Risk  (10/24/2023)   Received from [facility]   Overall Financial Resource Strain (CARDIA)    Difficulty of Paying Living Expenses: Not hard at all  Food Insecurity: No Food Insecurity (01/19/2024)   Hunger Vital Sign    Worried About Running Out of Food in the Last Year: Never true    Ran Out of Food in the Last Year: Never true  Transportation Needs: No Transportation Needs (01/19/2024)   PRAPARE - Administrator, Civil Service (Medical): No    Lack of Transportation (Non-Medical): No  Physical Activity: Not on file  Stress: Not on file  Social Connections: Moderately Integrated (01/19/2024)   Social Connection and Isolation Panel    Frequency of Communication with Friends and Family: More than three times a week    Frequency of Social Gatherings with Friends and Family: More than three times a week    Attends Religious Services: More than 4 times per year    Active Member of Clubs or Organizations: No    Attends Banker Meetings: Never    Marital Status: Married  Catering manager Violence: Not At Risk (01/19/2024)   Humiliation, Afraid, Rape, and Kick questionnaire    Fear of Current or Ex-Partner: No    Emotionally Abused: No    Physically Abused: No    Sexually Abused: No    Family History  Problem Relation  Age of Onset   Parkinson's disease Mother    Lupus Mother    Heart disease Father      Vitals:   01/20/24 2311 01/21/24 0401 01/21/24 0500 01/21/24 0700  BP: 95/68 96/66  (!) 87/71  Pulse: 85 81  79  Resp: 17 16  14   Temp: 98.2 F (36.8 C) 98 F (36.7 C)  98.3 F (36.8 C)  TempSrc:    Oral  SpO2: 92% 96%  95%  Weight:   79.3 kg   Height:        PHYSICAL  EXAM General: awake, well nourished, in no acute distress. HEENT: Normocephalic and atraumatic. Neck: No JVD.  Lungs: Normal respiratory effort. Clear bilaterally to auscultation. No wheezes, crackles, rhonchi.  Heart: HRRR. Normal S1 and S2 without gallops or murmurs.  Abdomen: Non-distended appearing.  Msk: Normal strength and tone for age. Extremities: Warm and well perfused. No clubbing, cyanosis. No peripheral edema.  Neuro: Alert and oriented X 3. Psych: Answers questions appropriately.   Labs: Basic Metabolic Panel: Recent Labs    01/20/24 0422 01/21/24 0328  NA 134* 135  K 3.5 3.8  CL 101 97*  CO2 24 26  GLUCOSE 106* 119*  BUN 24* 24*  CREATININE 1.96* 1.98*  CALCIUM  8.8* 9.2  MG  --  1.8   Liver Function Tests: No results for input(s): AST, ALT, ALKPHOS, BILITOT, PROT, ALBUMIN in the last 72 hours. No results for input(s): LIPASE, AMYLASE in the last 72 hours. CBC: Recent Labs    01/19/24 0721  WBC 5.1  HGB 12.4*  HCT 38.6*  MCV 85.0  PLT 164   Cardiac Enzymes: Recent Labs    01/19/24 0721 01/19/24 0928  TROPONINIHS 28* 21*   BNP: Recent Labs    01/19/24 0721  BNP 1,161.0*   D-Dimer: No results for input(s): DDIMER in the last 72 hours. Hemoglobin A1C: Recent Labs    01/19/24 1142  HGBA1C 7.1*   Fasting Lipid Panel: No results for input(s): CHOL, HDL, LDLCALC, TRIG, CHOLHDL, LDLDIRECT in the last 72 hours. Thyroid Function Tests: No results for input(s): TSH, T4TOTAL, T3FREE, THYROIDAB in the last 72 hours.  Invalid input(s):  FREET3 Anemia Panel: No results for input(s): VITAMINB12, FOLATE, FERRITIN, TIBC, IRON, RETICCTPCT in the last 72 hours.   Radiology: US  ASCITES (ABDOMEN LIMITED) Result Date: 01/19/2024 CLINICAL DATA:  Congestive heart failure.  Previous cholecystectomy. EXAM: LIMITED ABDOMEN ULTRASOUND FOR ASCITES TECHNIQUE: Limited ultrasound survey for ascites was performed in all four abdominal quadrants. COMPARISON:  CT abdomen 12/22/2022 FINDINGS: Minimal ascites over the right upper and right lower quadrants with trace free fluid adjacent the spleen in the left upper quadrant. No significant free fluid in the left lower quadrant this free fluid is new since the previous CT of August 2024. IMPRESSION: Minimal ascites as described. Electronically Signed   By: Toribio Agreste M.D.   On: 01/19/2024 15:02   DG Chest 2 View Result Date: 01/19/2024 CLINICAL DATA:  73 year old male with shortness of breath and unintentional weight gain. Chest tightness. EXAM: CHEST - 2 VIEW COMPARISON:  Chest radiographs 09/04/2022 and earlier. FINDINGS: PA and lateral views 0732 hours. Bilateral cardiac pacemaker/ICD are stable. Prior CABG. Mild cardiomegaly. Stable to mildly improved lung volumes from last year. No pneumothorax. No pulmonary edema. No convincing pleural effusion or acute lung opacity. Streaky mild infrahilar opacity appears to be chronic, stable since 2023. Hyperostosis related thoracic spinal ankylosis. No acute osseous abnormality identified. Small surgical clips in the visible upper abdomen. Nonobstructed bowel-gas pattern. IMPRESSION: 1. No acute cardiopulmonary abnormality. 2. Chronic cardiac pacemakers/ICD. Stable mild cardiomegaly. Prior CABG. Electronically Signed   By: VEAR Hurst M.D.   On: 01/19/2024 07:52   CUP PACEART INCLINIC DEVICE CHECK Result Date: 12/24/2023 Normal in-clinic _dual__ chamber ICD check. Presenting Rhythm: _AS-VS__ . Routine testing was performed. Thresholds, sensing, and  impedance demonstrate stable parameters and no programming changes needed. No treated arrhythmias. Estimated longevity _7.6 years___ . Pt enrolled in remote follow-up. CANDIE Needle, NP   ECHO 06/2022:  1. Left ventricular ejection fraction, by estimation,  is 20 to 25%. The left ventricle has severely decreased function. The left ventricle demonstrates global hypokinesis. The left ventricular internal cavity size was moderately dilated. Left ventricular diastolic parameters are consistent with Grade I diastolic dysfunction (impaired relaxation). The average left ventricular global  longitudinal strain is -4.2 %. The global longitudinal strain is abnormal.   2. Right ventricular systolic function is mildly reduced. The right ventricular size is normal. There is normal pulmonary artery systolic pressure. The estimated right ventricular systolic pressure is 26.0 mmHg.   3. The mitral valve is normal in structure. Moderate mitral valve  regurgitation. No evidence of mitral stenosis.   4. The aortic valve has an indeterminant number of cusps. Aortic valve  regurgitation is mild. Aortic valve sclerosis/calcification is present,  without any evidence of aortic stenosis.   TELEMETRY reviewed by me [facility]) 01/21/2024 : NSR  EKG reviewed by me: NSR with RBBB  Data reviewed by me [facility]) 01/21/2024: last 24h vitals tele labs imaging I/O provider notes  Principal Problem:   CHF (congestive heart failure) (HCC) Active Problems:   Acute CHF (congestive heart failure) (HCC)   HFrEF (heart failure with reduced ejection fraction) (HCC)    ASSESSMENT AND PLAN:  Jermaine Kramer is a 73 y.o. male  with a past medical history of CAD s/p CABG 2022, chronic HFrEF with LVEF 20-25%, ICM s/p ICD, IIDM, HTN, HLD, CKD stage IIIb, BPH, leukemia, hx penile cancer presented with worsening of shortness of breath and peripheral edema. Cardiology was consulted for further evaluation of acute heart failure.   # Acute on chronic  HFrEF # Coronary artery disease s/p CABG 2022 # Ischemic cardiomyopathy s/p ICD # Chronic kidney disease stage IIIb # Paroxysmal atrial fibrillation Presented with worsening SOB, LE edema. BNP elevated at 1161. Started on IV diuretics in the ED.  -S/p 1 dose IV lasix  40 mg this AM, plan to transition to PO torsemide  tomorrow (home 20 mg).  -Changed metolazone  to prn, has received 2 doses since admission (takes prn at home).  -Continue spironolactone  25 mg daily (closely monitor GFR), losartan  12.5 mg daily, metoprolol  succinate 12.5 mg daily. Not a candidate for SGLT2i.  -Continue Eliquis  5 mg bid.  -Continue atorvastatin  80 mg daily.   This patient's plan of care was discussed and created with Dr. Wilburn and he is in agreement.    Signed: Danita Bloch, PA-C 01/21/2024, 9:43 AM [facility] Cardiology

## 2024-01-21 NOTE — TOC CM/SW Note (Signed)
 Transition of Care [facility]) - Inpatient Brief Assessment   Patient Details  Name: Jermaine Kramer MRN: [id number] Date of Birth: 1951/03/18  [facility]) CM/SW Contact:    Alfonso Rummer, LCSW Phone Number: 01/21/2024, 1:44 PM   Clinical Narrative:  Jermaine Kramer Rummer completed toc chart review. No TOC needs identified.   Transition of Care Asessment:

## 2024-01-21 NOTE — Progress Notes (Signed)
  Progress Note   Patient: Jermaine Kramer FMW:[id number] DOB: [birth year] DOA: 01/19/2024     2 DOS: the patient was seen and examined on 01/21/2024   Brief hospital course: Jermaine Kramer is a 73 y.o. male with medical history significant of CAD, chronic HFrEF with LVEF 20 to 25%, IIDM, HTN, HLD, CKD stage IIIb, BPH, PPM, presented with worsening of shortness of breath and peripheral edema.  See H&P for full HPI on admission & ED course.  Pt was admitted on 01/19/24 for further evaluation and management of acute on chronic HFrEF and started on IV Lasix  for diuresis.   Consults: [facility] Cardiology   Assessment and Plan:  Acute on chronic HFrEF decompensation - Failed outpatient management with increased diuretics.   Pt follows with [facility]iology - consulted. - Continue IV Lasix  40 mg BID - Continue metolazone  - Monitoring renal function response & electrolytes - Strict Io's & daily weights - Echocardiogram was done within the year, repeat not ordered on admission   CKD stage IIIb - Creatinine level stable, significant volume overloaded - IV diuresis as above   Hyponatremia - hypervolemic. Expect improvement with diuresis. - Monitor BMP   PAF - In sinus rhythm - Continue beta-blocker, Eliquis  - Telemetry   IIDM - Hold off metformin  - Start SSI   BPH -Continue Flomax       Subjective: Pt seated edge of bed when seen this AM.  Reports still making good urine output on IV lasix .  He denies other complaints or issues. Hopes he can go home tomorrow.   Physical Exam: Vitals:   01/21/24 0500 01/21/24 0700 01/21/24 1000 01/21/24 1208  BP:  (!) 87/71 (!) 89/63 93/66  Pulse:  79 79 80  Resp:  14 17 18   Temp:  98.3 F (36.8 C)  97.6 F (36.4 C)  TempSrc:  Oral    SpO2:  95% 95% 99%  Weight: 79.3 kg     Height:       General exam: awake, alert, no acute distress HEENT: moist mucus membranes, hearing grossly normal  Respiratory system: CTAB diminished bases, no wheezes, rales  or rhonchi, normal respiratory effort. Cardiovascular system: normal S1/S2, RRR, no peripheral edema.   Gastrointestinal system: distended, non-tender Central nervous system: A&O x3. no gross focal neurologic deficits, normal speech Skin: dry, intact, normal temperature Psychiatry: normal mood, congruent affect, judgement and insight appear normal   Data Reviewed:  Notable labs -- glucose 119, BUN 24, Cr 2.04 >> 1.96 >> 1.98 stable  A1c 7.1 improved from 8.4 one year ago  Family Communication: at bedside on rounds  Disposition: Status is: Inpatient Remains inpatient appropriate because: remains on IV diuresis. D/C pending cardiology's clearance   Planned Discharge Destination: Home    Time spent: 38 minutes  Author: Burnard DELENA Cunning, DO 01/21/2024 3:04 PM  For on call review [email or web address].

## 2024-01-21 NOTE — Plan of Care (Signed)
  Problem: Activity: Goal: Risk for activity intolerance will decrease Outcome: Progressing   Problem: Clinical Measurements: Goal: Respiratory complications will improve Outcome: Progressing Goal: Cardiovascular complication will be avoided Outcome: Progressing   Problem: Nutrition: Goal: Adequate nutrition will be maintained Outcome: Progressing

## 2024-01-21 NOTE — Progress Notes (Signed)
 Heart Failure Navigator Progress Note  Current Advanced Heart Failure team patient of Ellouise Class, FNP.  Hospital Follow-Up scheduled for 01/25/24 @ 10:30 AM. Patient and wife aware of appointment details.  Navigator will sign off at this time.  Charmaine Pines, RN, BSN [facility] Heart Failure Navigator Secure Chat Only

## 2024-01-22 ENCOUNTER — Other Ambulatory Visit: Payer: Self-pay

## 2024-01-22 DIAGNOSIS — I5020 Unspecified systolic (congestive) heart failure: Secondary | ICD-10-CM | POA: Diagnosis not present

## 2024-01-22 LAB — BASIC METABOLIC PANEL WITH GFR
Anion gap: 10 (ref 5–15)
BUN: 28 mg/dL — ABNORMAL HIGH (ref 8–23)
CO2: 26 mmol/L (ref 22–32)
Calcium: 8.7 mg/dL — ABNORMAL LOW (ref 8.9–10.3)
Chloride: 94 mmol/L — ABNORMAL LOW (ref 98–111)
Creatinine, Ser: 2.06 mg/dL — ABNORMAL HIGH (ref 0.61–1.24)
GFR, Estimated: 33 mL/min — ABNORMAL LOW (ref >60)
Glucose, Bld: 119 mg/dL — ABNORMAL HIGH (ref 70–99)
Potassium: 3.7 mmol/L (ref 3.5–5.1)
Sodium: 130 mmol/L — ABNORMAL LOW (ref 135–145)

## 2024-01-22 LAB — GLUCOSE, CAPILLARY: Glucose-Capillary: 126 mg/dL — ABNORMAL HIGH (ref 70–99)

## 2024-01-22 MED ORDER — TAMSULOSIN HCL 0.4 MG PO CAPS: 0.4000 mg | ORAL_CAPSULE | Freq: Every day | ORAL | Status: DC

## 2024-01-22 MED ORDER — TORSEMIDE 20 MG PO TABS
40.0000 mg | ORAL_TABLET | Freq: Every day | ORAL | 0 refills | Status: DC
  Filled 2024-01-22: qty 60, 30d supply, fill #0

## 2024-01-22 MED ORDER — TORSEMIDE 20 MG PO TABS
40.0000 mg | ORAL_TABLET | Freq: Every day | ORAL | Status: DC
  Administered 2024-01-22: 40 mg via ORAL
  Filled 2024-01-22: qty 2

## 2024-01-22 MED ORDER — METOLAZONE 2.5 MG PO TABS
2.5000 mg | ORAL_TABLET | Freq: Every day | ORAL | 0 refills | Status: DC | PRN
  Filled 2024-01-22: qty 30, 30d supply, fill #0

## 2024-01-22 NOTE — TOC Transition Note (Signed)
 Transition of Care [facility]) - Discharge Note   Patient Details  Name: Jermaine Kramer MRN: [id number] Date of Birth: 1950/05/12  Transition of Care [facility]) CM/SW Contact:  Alfonso Rummer, LCSW Phone Number: 01/22/2024, 10:52 AM   Clinical Narrative:      Jermaine Kramer Rummer completed TOC chart review. TOC needs not identified and no barriers for barriers. Please contact TOC should needs arise. TOC signing off.        Patient Goals and CMS Choice            Discharge Placement                       Discharge Plan and Services Additional resources added to the After Visit Summary for                                       Social Drivers of Health (SDOH) Interventions SDOH Screenings   Food Insecurity: No Food Insecurity (01/19/2024)  Housing: Unknown (01/19/2024)  Transportation Needs: No Transportation Needs (01/19/2024)  Utilities: Not At Risk (01/19/2024)  Depression (PHQ2-9): Low Risk  (01/31/2022)  Financial Resource Strain: Low Risk  (10/24/2023)   Received from [facility]  Social Connections: Moderately Integrated (01/19/2024)  Tobacco Use: Low Risk  (01/19/2024)  Recent Concern: Tobacco Use - Medium Risk (11/26/2023)   Received from [facility]     Readmission Risk Interventions    06/11/2022   11:05 AM 07/27/2021   12:31 PM  Readmission Risk Prevention Plan  Transportation Screening Complete Complete  PCP or Specialist Appt within 3-5 Days  Complete  Social Work Consult for Recovery Care Planning/Counseling  Complete  Palliative Care Screening  Not Applicable  Medication Review Oceanographer) Complete Complete  PCP or Specialist appointment within 3-5 days of discharge Complete   SW Recovery Care/Counseling Consult Complete   Palliative Care Screening Not Applicable   Skilled Nursing Facility Not Applicable

## 2024-01-22 NOTE — Progress Notes (Signed)
 [facility] CLINIC CARDIOLOGY PROGRESS NOTE       Patient ID: Jermaine Kramer MRN: [id number] DOB/AGE: [birth year] 75 y.o.  Admit date: 01/19/2024 Referring Physician Dr Tobie Primary Physician Perri, Mychal Burnard, PA-C Primary Cardiologist Dr Ammon Reason for Consultation AoCHFrEF  HPI: Jermaine Kramer is a 73 y.o. male  with a past medical history of CAD s/p CABG 2022, chronic HFrEF with LVEF 20-25%, ICM s/p ICD, IIDM, HTN, HLD, CKD stage IIIb, BPH, leukemia, hx penile cancer presented with worsening of shortness of breath and peripheral edema. Cardiology was consulted for further evaluation of acute heart failure.   Interval history: -Patient seen and examined this AM, resting in hospital bed with wife at bedside. -Reports he feels well today, denies SOB. No LE edema.  -Renal function remains stable.  -BP borderline low which is baseline for him. He is without dizziness or lightheadedness and has felt fine when OOB.   Review of systems complete and found to be negative unless listed above     Past Medical History:  Diagnosis Date   Arrhythmia    atrial fibrillation   CHF (congestive heart failure) (HCC)    Coronary artery disease    Diabetes mellitus without complication (HCC)    GERD (gastroesophageal reflux disease)    Hypercholesteremia    Hypertension    Sleep apnea    Stroke [facility])    2015 or longer ago    Past Surgical History:  Procedure Laterality Date   CATARACT EXTRACTION Bilateral 04/06/2022   CCM GENERATOR AND A/V LEAD INSERTION N/A 09/04/2022   Procedure: CCM GENERATOR AND A/V LEAD INSERTION;  Surgeon: Cindie Ole DASEN, MD;  Location: [facility] INVASIVE CV LAB;  Service: Cardiovascular;  Laterality: N/A;   CHOLECYSTECTOMY     COLONOSCOPY WITH PROPOFOL  N/A 04/14/2019   Procedure: COLONOSCOPY WITH PROPOFOL ;  Surgeon: Toledo, Ladell POUR, MD;  Location: [facility] ENDOSCOPY;  Service: Gastroenterology;  Laterality: N/A;   CORONARY ARTERY BYPASS GRAFT  02/16/2021   ICD IMPLANT  N/A 06/22/2021   Procedure: ICD IMPLANT;  Surgeon: Cindie Ole DASEN, MD;  Location: [facility] INVASIVE CV LAB;  Service: Cardiovascular;  Laterality: N/A;   KNEE ARTHROSCOPY     RENAL CYST EXCISION      Medications Prior to Admission  Medication Sig Dispense Refill Last Dose/Taking   apixaban  (ELIQUIS ) 5 MG TABS tablet Take 1 tablet (5 mg total) by mouth 2 (two) times daily. 180 tablet 3 01/18/2024   atorvastatin  (LIPITOR ) 80 MG tablet Take 1 tablet (80 mg total) by mouth daily. 30 tablet 5 01/18/2024   Furosemide  (FUROSCIX ) 80 MG/10ML CTKT Inject 80 mg into the skin daily for 2 days. (Patient taking differently: Inject 80 mg into the skin as needed.)   01/18/2024   losartan  (COZAAR ) 25 MG tablet TAKE 1/2 TABLET BY MOUTH DAILY 45 tablet 2 01/18/2024   metFORMIN  (GLUCOPHAGE ) 500 MG tablet Take 500 mg by mouth 2 (two) times daily.   01/18/2024   metolazone  (ZAROXOLYN ) 2.5 MG tablet Take 1 tablet (2.5 mg total) by mouth daily. 10 tablet 3 Taking   omeprazole (PRILOSEC) 40 MG capsule Take 40 mg by mouth 2 (two) times daily.   01/18/2024   ondansetron  (ZOFRAN -ODT) 4 MG disintegrating tablet Take 1 tablet (4 mg total) by mouth every 8 (eight) hours as needed for nausea or vomiting. 12 tablet 0 Taking As Needed   Semaglutide,0.25 or 0.5MG /DOS, 2 MG/3ML SOPN Inject 0.25 mg into the skin once a week.   01/13/2024   spironolactone  (  ALDACTONE ) 25 MG tablet TAKE 1 TABLET (25 MG TOTAL) BY MOUTH DAILY. 90 tablet 3 01/18/2024   tamsulosin  (FLOMAX ) 0.4 MG CAPS capsule Take 0.4 mg by mouth in the morning and at bedtime.   01/18/2024   torsemide  (DEMADEX ) 20 MG tablet Take 1 tablet (20 mg total) by mouth 2 (two) times daily. (Patient taking differently: Take 20 mg by mouth daily.)   01/18/2024   calcitRIOL (ROCALTROL) 0.25 MCG capsule Take 0.25 mcg by mouth.      Continuous Glucose Sensor (FREESTYLE LIBRE 2 SENSOR) MISC by Does not apply route.      Insulin  Pen Needle (COMFORT EZ PEN NEEDLES) 32G X 8 MM MISC by Other route.       metoprolol  succinate (TOPROL -XL) 25 MG 24 hr tablet TAKE 1/2 TABLET BY MOUTH EVERY EVENING 45 tablet 3    Social History   Socioeconomic History   Marital status: Married    Spouse name: Not on file   Number of children: Not on file   Years of education: Not on file   Highest education level: Not on file  Occupational History   Not on file  Tobacco Use   Smoking status: Never   Smokeless tobacco: Never  Vaping Use   Vaping status: Never Used  Substance and Sexual Activity   Alcohol use: Not Currently   Drug use: No   Sexual activity: Not Currently  Other Topics Concern   Not on file  Social History Narrative   Not on file   Social Drivers of Health   Financial Resource Strain: Low Risk  (10/24/2023)   Received from [facility]   Overall Financial Resource Strain (CARDIA)    Difficulty of Paying Living Expenses: Not hard at all  Food Insecurity: No Food Insecurity (01/19/2024)   Hunger Vital Sign    Worried About Running Out of Food in the Last Year: Never true    Ran Out of Food in the Last Year: Never true  Transportation Needs: No Transportation Needs (01/19/2024)   PRAPARE - Administrator, Civil Service (Medical): No    Lack of Transportation (Non-Medical): No  Physical Activity: Not on file  Stress: Not on file  Social Connections: Moderately Integrated (01/19/2024)   Social Connection and Isolation Panel    Frequency of Communication with Friends and Family: More than three times a week    Frequency of Social Gatherings with Friends and Family: More than three times a week    Attends Religious Services: More than 4 times per year    Active Member of [employer] or Organizations: No    Attends Banker Meetings: Never    Marital Status: Married  Catering manager Violence: Not At Risk (01/19/2024)   Humiliation, Afraid, Rape, and Kick questionnaire    Fear of Current or Ex-Partner: No    Emotionally Abused: No    Physically  Abused: No    Sexually Abused: No    Family History  Problem Relation Age of Onset   Parkinson's disease Mother    Lupus Mother    Heart disease Father      Vitals:   01/22/24 0351 01/22/24 0453 01/22/24 0459 01/22/24 0737  BP: (!) 86/51 (!) 85/61  92/66  Pulse: 82   77  Resp: 18   20  Temp: 97.9 F (36.6 C)   97.8 F (36.6 C)  TempSrc:    Oral  SpO2: 93%   95%  Weight:   79.9  kg   Height:        PHYSICAL EXAM General: awake, well nourished, in no acute distress. HEENT: Normocephalic and atraumatic. Neck: No JVD.  Lungs: Normal respiratory effort. Clear bilaterally to auscultation. No wheezes, crackles, rhonchi.  Heart: HRRR. Normal S1 and S2 without gallops or murmurs.  Abdomen: Non-distended appearing.  Msk: Normal strength and tone for age. Extremities: Warm and well perfused. No clubbing, cyanosis. No peripheral edema.  Neuro: Alert and oriented X 3. Psych: Answers questions appropriately.   Labs: Basic Metabolic Panel: Recent Labs    01/21/24 0328 01/22/24 0318  NA 135 130*  K 3.8 3.7  CL 97* 94*  CO2 26 26  GLUCOSE 119* 119*  BUN 24* 28*  CREATININE 1.98* 2.06*  CALCIUM  9.2 8.7*  MG 1.8  --    Liver Function Tests: No results for input(s): AST, ALT, ALKPHOS, BILITOT, PROT, ALBUMIN in the last 72 hours. No results for input(s): LIPASE, AMYLASE in the last 72 hours. CBC: No results for input(s): WBC, NEUTROABS, HGB, HCT, MCV, PLT in the last 72 hours.  Cardiac Enzymes: Recent Labs    01/19/24 0928  TROPONINIHS 21*   BNP: No results for input(s): BNP in the last 72 hours.  D-Dimer: No results for input(s): DDIMER in the last 72 hours. Hemoglobin A1C: Recent Labs    01/19/24 1142  HGBA1C 7.1*   Fasting Lipid Panel: No results for input(s): CHOL, HDL, LDLCALC, TRIG, CHOLHDL, LDLDIRECT in the last 72 hours. Thyroid Function Tests: No results for input(s): TSH, T4TOTAL, T3FREE, THYROIDAB  in the last 72 hours.  Invalid input(s): FREET3 Anemia Panel: No results for input(s): VITAMINB12, FOLATE, FERRITIN, TIBC, IRON, RETICCTPCT in the last 72 hours.   Radiology: US  ASCITES (ABDOMEN LIMITED) Result Date: 01/19/2024 CLINICAL DATA:  Congestive heart failure.  Previous cholecystectomy. EXAM: LIMITED ABDOMEN ULTRASOUND FOR ASCITES TECHNIQUE: Limited ultrasound survey for ascites was performed in all four abdominal quadrants. COMPARISON:  CT abdomen 12/22/2022 FINDINGS: Minimal ascites over the right upper and right lower quadrants with trace free fluid adjacent the spleen in the left upper quadrant. No significant free fluid in the left lower quadrant this free fluid is new since the previous CT of August 2024. IMPRESSION: Minimal ascites as described. Electronically Signed   By: Toribio Agreste M.D.   On: 01/19/2024 15:02   DG Chest 2 View Result Date: 01/19/2024 CLINICAL DATA:  72 year old male with shortness of breath and unintentional weight gain. Chest tightness. EXAM: CHEST - 2 VIEW COMPARISON:  Chest radiographs 09/04/2022 and earlier. FINDINGS: PA and lateral views 0732 hours. Bilateral cardiac pacemaker/ICD are stable. Prior CABG. Mild cardiomegaly. Stable to mildly improved lung volumes from last year. No pneumothorax. No pulmonary edema. No convincing pleural effusion or acute lung opacity. Streaky mild infrahilar opacity appears to be chronic, stable since 2023. Hyperostosis related thoracic spinal ankylosis. No acute osseous abnormality identified. Small surgical clips in the visible upper abdomen. Nonobstructed bowel-gas pattern. IMPRESSION: 1. No acute cardiopulmonary abnormality. 2. Chronic cardiac pacemakers/ICD. Stable mild cardiomegaly. Prior CABG. Electronically Signed   By: VEAR Hurst M.D.   On: 01/19/2024 07:52   CUP PACEART INCLINIC DEVICE CHECK Result Date: 12/24/2023 Normal in-clinic _dual__ chamber ICD check. Presenting Rhythm: _AS-VS__ . Routine testing was  performed. Thresholds, sensing, and impedance demonstrate stable parameters and no programming changes needed. No treated arrhythmias. Estimated longevity _7.6 years___ . Pt enrolled in remote follow-up. CANDIE Needle, NP   ECHO 06/2022:  1. Left ventricular ejection fraction, by estimation,  is 20 to 25%. The left ventricle has severely decreased function. The left ventricle demonstrates global hypokinesis. The left ventricular internal cavity size was moderately dilated. Left ventricular diastolic parameters are consistent with Grade I diastolic dysfunction (impaired relaxation). The average left ventricular global  longitudinal strain is -4.2 %. The global longitudinal strain is abnormal.   2. Right ventricular systolic function is mildly reduced. The right ventricular size is normal. There is normal pulmonary artery systolic pressure. The estimated right ventricular systolic pressure is 26.0 mmHg.   3. The mitral valve is normal in structure. Moderate mitral valve  regurgitation. No evidence of mitral stenosis.   4. The aortic valve has an indeterminant number of cusps. Aortic valve  regurgitation is mild. Aortic valve sclerosis/calcification is present,  without any evidence of aortic stenosis.   TELEMETRY reviewed by me [facility]) 01/22/2024 : VP 80s  EKG reviewed by me: NSR with RBBB  Data reviewed by me [facility]) 01/22/2024: last 24h vitals tele labs imaging I/O provider notes  Principal Problem:   CHF (congestive heart failure) (HCC) Active Problems:   Acute CHF (congestive heart failure) (HCC)   HFrEF (heart failure with reduced ejection fraction) (HCC)    ASSESSMENT AND PLAN:  Jermaine Kramer is a 73 y.o. male  with a past medical history of CAD s/p CABG 2022, chronic HFrEF with LVEF 20-25%, ICM s/p ICD, IIDM, HTN, HLD, CKD stage IIIb, BPH, leukemia, hx penile cancer presented with worsening of shortness of breath and peripheral edema. Cardiology was consulted for further evaluation of acute heart  failure.   # Acute on chronic HFrEF # Coronary artery disease s/p CABG 2022 # Ischemic cardiomyopathy s/p ICD # Chronic kidney disease stage IIIb # Paroxysmal atrial fibrillation Presented with worsening SOB, LE edema. BNP elevated at 1161. Started on IV diuretics in the ED.  -Start PO torsemide  40 mg daily today.  -Metolazone  prn, has received 2 doses since admission (takes prn at home).  -Losartan  discontinued yesterday due to hypotension. Continue spironolactone  25 mg daily (closely monitor GFR), metoprolol  succinate 12.5 mg daily. Not a candidate for SGLT2i.  -Continue Eliquis  5 mg bid.  -Continue atorvastatin  80 mg daily.   Ok for discharge today from a cardiac perspective. Has HF follow up with Ellouise Class on Friday. Will arrange for follow up in clinic with Therisa Pierre, PA or Dr. Ammon in 1-2 weeks.    This patient's plan of care was discussed and created with Dr. Wilburn and he is in agreement.    Signed: Danita Bloch, PA-C 01/22/2024, 9:26 AM [facility]

## 2024-01-22 NOTE — Discharge Summary (Signed)
 Physician Discharge Summary   Patient: Jermaine Kramer MRN: [id number] DOB: 08-Dec-1950  Admit date:     01/19/2024  Discharge date: 01/22/2024  Discharge Physician: Burnard DELENA Cunning   PCP: Perri Constance Burnard, PA-C   Recommendations at discharge:    Follow up with Cardiology as scheduled Follow up with Primary Care in 1-2 weeks Repeat CBC, CMP, Mg at follow up  Discharge Diagnoses: Principal Problem:   CHF (congestive heart failure) (HCC) Active Problems:   Acute CHF (congestive heart failure) (HCC)   HFrEF (heart failure with reduced ejection fraction) (HCC)  Resolved Problems:   * No resolved hospital problems. *  Hospital Course:  Jermaine Kramer is a 73 y.o. male with medical history significant of CAD, chronic HFrEF with LVEF 20 to 25%, IIDM, HTN, HLD, CKD stage IIIb, BPH, PPM, presented with worsening of shortness of breath and peripheral edema.  See H&P for full HPI on admission & ED course.   Pt was admitted on 01/19/24 for further evaluation and management of acute on chronic HFrEF and started on IV Lasix  for diuresis.     Further hospital course and management as outlined below.  9/23 -- pt doing well this AM, has no acute complaints.  Cleared by Cardiology and is medically stable, requesting discharge home today.     Assessment and Plan:   Acute on chronic HFrEF decompensation - Failed outpatient management with increased diuretics.   Pt follows with [facility] Cardiology - consulted. - Treated w IV Lasix  40 mg BID - Continue metolazone  - losartan  dc'd due to soft BP's - GDMT as BP tolerates - Resume torsemide  on discharge - Monitoring renal function response & electrolytes - Daily weights - Follow up with Cardiology   CKD stage IIIb - Creatinine level stable near basline - repeat BMP in follow up   Hyponatremia - hypervolemic. Improved to 135 with diuresis >> 130 this AM - Monitor BMP - Continue torsemide  - daily weights   PAF - In sinus rhythm -  Continue beta-blocker, Eliquis  - Telemetry   IIDM - Resume metformin  - Covered with SSI during admission   BPH -Continue Flomax       Consultants: [facility] Cardiology Procedures performed: None  Disposition: Home Diet recommendation:  Cardiac and Carb modified diet DISCHARGE MEDICATION: Allergies as of 01/22/2024       Reactions   Doxycycline Anaphylaxis   Patient does not recall having this reaction.    Heparin Other (See Comments)   heparin-induced thrombocytopenia   Empagliflozin Itching   Groin infection Caused a groin infection.         Medication List     STOP taking these medications    Furoscix  80 MG/10ML Ctkt Generic drug: Furosemide    losartan  25 MG tablet Commonly known as: COZAAR        TAKE these medications    apixaban  5 MG Tabs tablet Commonly known as: ELIQUIS  Take 1 tablet (5 mg total) by mouth 2 (two) times daily.   atorvastatin  80 MG tablet Commonly known as: LIPITOR  Take 1 tablet (80 mg total) by mouth daily.   calcitRIOL 0.25 MCG capsule Commonly known as: ROCALTROL Take 0.25 mcg by mouth.   Comfort EZ Pen Needles 32G X 8 MM Misc Generic drug: Insulin  Pen Needle by Other route.   FreeStyle Libre 2 Sensor Misc by Does not apply route.   metFORMIN  500 MG tablet Commonly known as: GLUCOPHAGE  Take 500 mg by mouth 2 (two) times daily.   metolazone  2.5 MG tablet  Commonly known as: ZAROXOLYN  Take 1 tablet (2.5 mg total) by mouth daily as needed (worsening edema, SOB). What changed:  when to take this reasons to take this   metoprolol  succinate 25 MG 24 hr tablet Commonly known as: TOPROL -XL TAKE 1/2 TABLET BY MOUTH EVERY EVENING   omeprazole 40 MG capsule Commonly known as: PRILOSEC Take 40 mg by mouth 2 (two) times daily.   ondansetron  4 MG disintegrating tablet Commonly known as: ZOFRAN -ODT Take 1 tablet (4 mg total) by mouth every 8 (eight) hours as needed for nausea or vomiting.   Semaglutide(0.25 or 0.5MG /DOS) 2  MG/3ML Sopn Inject 0.25 mg into the skin once a week.   spironolactone  25 MG tablet Commonly known as: ALDACTONE  TAKE 1 TABLET (25 MG TOTAL) BY MOUTH DAILY. What changed: how much to take   tamsulosin  0.4 MG Caps capsule Commonly known as: FLOMAX  Take 1 capsule (0.4 mg total) by mouth daily after supper. What changed: when to take this   torsemide  20 MG tablet Commonly known as: DEMADEX  Take 2 tablets (40 mg total) by mouth daily. What changed:  how much to take when to take this        Follow-up Information     [facility]. Go on 01/25/2024.   Specialty: Cardiology Why: Hospital Follow-Up 01/25/24 @ 10:30 Please bring all medications to folloe-up appt. [facility], [address] Free Valet Parking at the [employer] information: [address] [place]  [address] [phone]        Clarisa Kung, PA-C. Go in 1 week(s).   Why: Schedule with Kung Clarisa, PA or Dr. Ammon Pass information: [phone] [facility] [place] [address] [phone]                Discharge Exam: Filed Weights   01/20/24 0500 01/21/24 0500 01/22/24 0459  Weight: 81.2 kg 79.3 kg 79.9 kg   General exam: awake, alert, no acute distress HEENT: moist mucus membranes, hearing grossly normal  Respiratory system: CTAB, no wheezes, rales or rhonchi, normal respiratory effort. Cardiovascular system: normal S1/S2, RRR, no JVD, murmurs, rubs, gallops, no pedal edema.   Gastrointestinal system: soft, NT, ND Central nervous system: A&O x3. no gross focal neurologic deficits, normal speech Extremities: moves all, no edema, normal tone Psychiatry: normal mood, congruent affect, judgement and insight appear normal'  Condition at discharge: stable  The results of significant diagnostics from this hospitalization (including imaging, microbiology, ancillary and laboratory) are  listed below for reference.   Imaging Studies: US  ASCITES (ABDOMEN LIMITED) Result Date: 01/19/2024 CLINICAL DATA:  Congestive heart failure.  Previous cholecystectomy. EXAM: LIMITED ABDOMEN ULTRASOUND FOR ASCITES TECHNIQUE: Limited ultrasound survey for ascites was performed in all four abdominal quadrants. COMPARISON:  CT abdomen 12/22/2022 FINDINGS: Minimal ascites over the right upper and right lower quadrants with trace free fluid adjacent the spleen in the left upper quadrant. No significant free fluid in the left lower quadrant this free fluid is new since the previous CT of August 2024. IMPRESSION: Minimal ascites as described. Electronically Signed   By: Toribio Agreste M.D.   On: 01/19/2024 15:02   DG Chest 2 View Result Date: 01/19/2024 CLINICAL DATA:  73 year old male with shortness of breath and unintentional weight gain. Chest tightness. EXAM: CHEST - 2 VIEW COMPARISON:  Chest radiographs 09/04/2022 and earlier. FINDINGS: PA and lateral views 0732 hours. Bilateral cardiac pacemaker/ICD are stable. Prior CABG. Mild cardiomegaly. Stable to  mildly improved lung volumes from last year. No pneumothorax. No pulmonary edema. No convincing pleural effusion or acute lung opacity. Streaky mild infrahilar opacity appears to be chronic, stable since 2023. Hyperostosis related thoracic spinal ankylosis. No acute osseous abnormality identified. Small surgical clips in the visible upper abdomen. Nonobstructed bowel-gas pattern. IMPRESSION: 1. No acute cardiopulmonary abnormality. 2. Chronic cardiac pacemakers/ICD. Stable mild cardiomegaly. Prior CABG. Electronically Signed   By: VEAR Hurst M.D.   On: 01/19/2024 07:52    Microbiology: Results for orders placed or performed during the hospital encounter of 01/19/24  Resp panel by RT-PCR (RSV, Flu A&B, Covid) Anterior Nasal Swab     Status: None   Collection Time: 01/19/24  7:22 AM   Specimen: Anterior Nasal Swab  Result Value Ref Range Status   SARS  Coronavirus 2 by RT PCR NEGATIVE NEGATIVE Final    Comment: (NOTE) SARS-CoV-2 target nucleic acids are NOT DETECTED.  The SARS-CoV-2 RNA is generally detectable in upper respiratory specimens during the acute phase of infection. The lowest concentration of SARS-CoV-2 viral copies this assay can detect is 138 copies/mL. A negative result does not preclude SARS-Cov-2 infection and should not be used as the sole basis for treatment or other patient management decisions. A negative result may occur with  improper specimen collection/handling, submission of specimen other than nasopharyngeal swab, presence of viral mutation(s) within the areas targeted by this assay, and inadequate number of viral copies(<138 copies/mL). A negative result must be combined with clinical observations, patient history, and epidemiological information. The expected result is Negative.  Fact Sheet for Patients:  BloggerCourse.com  Fact Sheet for Healthcare Providers:  SeriousBroker.it  This test is no t yet approved or cleared by the [place]  FDA and  has been authorized for detection and/or diagnosis of SARS-CoV-2 by FDA under an Emergency Use Authorization (EUA). This EUA will remain  in effect (meaning this test can be used) for the duration of the COVID-19 declaration under Section 564(b)(1) of the Act, 21 U.S.C.section 360bbb-3(b)(1), unless the authorization is terminated  or revoked sooner.       Influenza A by PCR NEGATIVE NEGATIVE Final   Influenza B by PCR NEGATIVE NEGATIVE Final    Comment: (NOTE) The Xpert Xpress SARS-CoV-2/FLU/RSV plus assay is intended as an aid in the diagnosis of influenza from Nasopharyngeal swab specimens and should not be used as a sole basis for treatment. Nasal washings and aspirates are unacceptable for Xpert Xpress SARS-CoV-2/FLU/RSV testing.  Fact Sheet for  Patients: BloggerCourse.com  Fact Sheet for Healthcare Providers: SeriousBroker.it  This test is not yet approved or cleared by the [place]  FDA and has been authorized for detection and/or diagnosis of SARS-CoV-2 by FDA under an Emergency Use Authorization (EUA). This EUA will remain in effect (meaning this test can be used) for the duration of the COVID-19 declaration under Section 564(b)(1) of the Act, 21 U.S.C. section 360bbb-3(b)(1), unless the authorization is terminated or revoked.     Resp Syncytial Virus by PCR NEGATIVE NEGATIVE Final    Comment: (NOTE) Fact Sheet for Patients: BloggerCourse.com  Fact Sheet for Healthcare Providers: SeriousBroker.it  This test is not yet approved or cleared by the [place]  FDA and has been authorized for detection and/or diagnosis of SARS-CoV-2 by FDA under an Emergency Use Authorization (EUA). This EUA will remain in effect (meaning this test can be used) for the duration of the COVID-19 declaration under Section 564(b)(1) of the Act, 21 U.S.C. section 360bbb-3(b)(1), unless the authorization is  terminated or revoked.  Performed at [facility], [address] [place], [place] [address]     Labs: CBC: No results for input(s): WBC, NEUTROABS, HGB, HCT, MCV, PLT in the last 168 hours.  Basic Metabolic Panel: No results for input(s): NA, K, CL, CO2, GLUCOSE, BUN, CREATININE, CALCIUM , MG, PHOS in the last 168 hours.  Liver Function Tests: No results for input(s): AST, ALT, ALKPHOS, BILITOT, PROT, ALBUMIN in the last 168 hours. CBG: No results for input(s): GLUCAP in the last 168 hours.   Discharge time spent: less than 30 minutes.  Signed: Burnard DELENA Cunning, DO [facility] 01/31/2024

## 2024-01-22 NOTE — Progress Notes (Signed)
 Discharge instructions (including medications) discussed with and copy provided to patient. Patient and wife given the opportunity to ask questions. Questions clarified.

## 2024-01-22 NOTE — Plan of Care (Signed)

## 2024-01-22 NOTE — Progress Notes (Signed)
 Heart Failure Stewardship Pharmacy Note  PCP: Perri Constance Sor, PA-C PCP-Cardiologist: -  HPI: Jermaine Kramer is a 73 y.o. male with CAD, chronic HFrEF, T2DM, HTN, HLD, CKD stage IIIb, BPH, PPM  who presented with shortness of breath and peripheral edema. On admission, BNP was 1161, HS-troponin was 28, and A1c 7.1. Chest x-ray noted no acute abnormality.    Pertinent cardiac history: Underwent CABG 2022. TTE 05/2021 with LVEF <20%, G1DD. ICD placed 06/2021. TTE 08/2021 with LVEF 30-35% and G3DD. TTE 06/2022 with LVEF 20-25%, G1DD, mild RV dysfunction. Gen change and A/V lead insertion in 08/2022.  Pertinent Lab Values: Creatinine  Date Value Ref Range Status  10/23/2013 0.95 0.60 - 1.30 mg/dL Final   Creatinine, Ser  Date Value Ref Range Status  01/22/2024 2.06 (H) 0.61 - 1.24 mg/dL Final   BUN  Date Value Ref Range Status  01/22/2024 28 (H) 8 - 23 mg/dL Final  93/74/7984 14 7 - 18 mg/dL Final   Potassium  Date Value Ref Range Status  01/22/2024 3.7 3.5 - 5.1 mmol/L Final  10/23/2013 4.0 3.5 - 5.1 mmol/L Final   Sodium  Date Value Ref Range Status  01/22/2024 130 (L) 135 - 145 mmol/L Final  10/23/2013 139 136 - 145 mmol/L Final   B Natriuretic Peptide  Date Value Ref Range Status  01/19/2024 1,161.0 (H) 0.0 - 100.0 pg/mL Final    Comment:    Performed at [facility], [address] [place], [place] [address]   Magnesium   Date Value Ref Range Status  01/21/2024 1.8 1.7 - 2.4 mg/dL Final    Comment:    Performed at [facility], [address] [place], [place] [address]   Hgb A1c MFr Bld  Date Value Ref Range Status  01/19/2024 7.1 (H) 4.8 - 5.6 % Final    Comment:    (NOTE) Diagnosis of Diabetes The following HbA1c ranges recommended by the American Diabetes Association (ADA) may be used as an aid in the diagnosis of diabetes mellitus.  Hemoglobin             Suggested A1C NGSP%              Diagnosis  <5.7                   Non  Diabetic  5.7-6.4                Pre-Diabetic  >6.4                   Diabetic  <7.0                   Glycemic control for                       adults with diabetes.      Vital Signs:  Temp:  [97.4 F (36.3 C)-98 F (36.7 C)] 97.9 F (36.6 C) (09/23 0351) Pulse Rate:  [79-82] 82 (09/23 0351) Cardiac Rhythm: Normal sinus rhythm;Bundle branch block (09/22 1900) Resp:  [17-20] 18 (09/23 0351) BP: (83-93)/(51-66) 85/61 (09/23 0453) SpO2:  [93 %-99 %] 93 % (09/23 0351) Weight:  [79.9 kg (176 lb 3.2 oz)] 79.9 kg (176 lb 3.2 oz) (09/23 0459)  Intake/Output Summary (Last 24 hours) at 01/22/2024 0719 Last data filed at 01/21/2024 2112 Gross per 24 hour  Intake 1563 ml  Output 1375 ml  Net 188 ml    Current  Heart Failure Medications:  Loop diuretic: torsemide  40 mg daily Beta-Blocker: metoprolol  succinate 12.5 mg daily ACEI/ARB/ARNI: losartan  12.5 mg daily MRA: spironolactone  25 mg daily SGLT2i: none Other: none  Prior to admission Heart Failure Medications:  Loop diuretic: torsemide  20 mg daily Beta-Blocker: metoprolol  succinate 12.5 mg daily ACEI/ARB/ARNI: losartan  12.5 mg daily MRA: spironolactone  25 mg daily SGLT2i: none Other: none  Assessment: 1. 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 NYHA class II-III symptoms.  -Symptoms: Reports feeling close to baseline. Denies any shortness of breath, orthopnea, or LEE. Abdominal swelling improved. -Volume: Appears euvolemic. Urine color darker. Transitioned to oral torsemide  40 mg daily. -Hemodynamics: BP soft, but asymptomatic.  -BB: Continue metoprolol  succinate 12.5 mg daily -ACEI/ARB/ARNI: Continue losartan  12.5 mg daily. BP too soft to titrate at this time. -MRA: Continue spironolactone  25 mg daily for now. Creatinine is nearing the cutoff. May require transition to Kerendia if CKD has progressed. -SGLT2i: No SGLT2i given hx of penile cancer and  UTI.  Plan: 1) Medication changes recommended at this time: -none  2) Patient assistance: -Pending  3) Education: - Patient has been educated on current HF medications and potential additions to HF medication regimen - Patient verbalizes understanding that over the next few months, these medication doses may change and more medications may be added to optimize HF regimen - Patient has been educated on basic disease state pathophysiology and goals of therapy  Medication Assistance / Insurance Benefits Check: Does the patient have prescription insurance?    Please do not hesitate to reach out with questions or concerns,  Jaun Bash, PharmD, CPP, BCPS, [facility] Heart Failure Pharmacist  Phone - [phone] 01/22/2024 7:19 AM

## 2024-01-22 NOTE — Plan of Care (Signed)
  Problem: Clinical Measurements: Goal: Ability to maintain clinical measurements within normal limits will improve Outcome: Progressing   Problem: Pain Managment: Goal: General experience of comfort will improve and/or be controlled Outcome: Progressing   Problem: Safety: Goal: Ability to remain free from injury will improve Outcome: Progressing

## 2024-01-23 NOTE — Progress Notes (Signed)
Remote ICD Transmission.

## 2024-01-24 ENCOUNTER — Telehealth: Payer: Self-pay | Admitting: Family

## 2024-01-24 NOTE — Telephone Encounter (Signed)
 Called to confirm/remind patient of their appointment at the [facility] on 01/25/24.   Appointment:   [x] Confirmed  [] Left mess   [] No answer/No voice mail  [] VM Full/unable to leave message  [] Phone not in service  Patient reminded to bring all medications and/or complete list.  Confirmed patient has transportation. Gave directions, instructed to utilize valet parking.

## 2024-01-24 NOTE — Progress Notes (Unsigned)
 Advanced Heart Failure Clinic Note    PCP: Perri Constance Sor, PA-C  Primary Cardiologist: Ammon Blunt, MD/ Clarisa Kung, [place] (last seen 07/25) ADHF provider: Rolan Barrack, MD   Chief Complaint: shortness of breath   HPI:  Mr Jermaine Kramer is a 73 y/o male with a history of leukemia, DM, mesenteric panniculitis, CAD (CABG 02/16/21), AF, sleep apnea, hyperlipidemia, HTN, stroke, GERD & chronic heart failure. Had CCM generator with A/V lead insertion on 09/04/22.  Was in the ED 04/07/22 due to cough/ SOB.   Echo 05/16/21: EF <20% along with mild/moderate LAE and mild/moderate MR.  Echo 09/16/21: EF 30-35% along with mild MR.  Admitted 06/09/22 due to LLQ abdominal pain. CT showed mesenteric pancolitis.   Echo 06/29/22: EF 25% with moderate MR.    Was in the ED 12/22/22 due to left flank pain over the last 24 hours. Abdominal CT negative. Reassuring CBC with a normal white blood cell count, reassuring chemistry with normal LFTs. Normal lipase. Urinalysis shows no concerning findings.     Was in the ED 04/05/23 due to weakness thought to be due to ozempic use. Ozempic stopped.   Was in the ED 04/16/23 due to hypotension. SBP in the 70's. Has had decreased fluid intake since ozempic usage which was stopped 2 weeks prior. Oral intake improving but still with nausea. Labs show no significant anemia, leukocytosis, electrolyte abnormality, or AKI. EKG shows no evidence of arrhythmia or ischemia.   Seen in [facility] 12/03/23 due to being fluid up. Was given furoscix  X 2 days. Patient called the office the following day down 3 pounds and feeling much better. Asking if they still needed to take the 2nd fuoscix due to renal function. Advised to hold it and use if needed later in the week.   He presents today, with his wife, for a HF follow-up visit with a chief complaint of shortness of breath (unchanged). Has decreased appetite, fatigue and continued nausea. Wife / patient continue to be frustrated with his  constant nausea/ intermittent diarrhea each week with ozempic use. Has numerous appointment over the next several months. Took the metolazone  since last visit but doesn't think it made much difference.    Previous cardiac studies:   LHC 02/10/21:  2v CAD, including 100% midRCA, 70% midLAD with 80% focal distal LAD, 70% D1  Elevated LVEDP ( )   Cardiac MRI 01/27/21:  1. The left ventricle is mildly dilated.  Wall thickness is normal.     a.  The inferior wall is akinetic.     b.  The basal septum is severely hypokinetic.  The mid-distal septum is moderately hypokinetic.     c.  The basal anterior wall is severely hypokinetic. The mid-distal anterior wall and apex are  moderately hypokinetic.     d.  The lateral wall is moderately. hypokinetic.   Global LV systolic function is severely reduced with an LVEF calculated at 30%.    2. The right ventricle is normal in cavity size and wall thickness.  There is mild global hypokinesis with  mildly reduced RV systolic function.  The RVEF is calculated at 43%.   ROS: All systems negative except as listed in HPI, PMH and Problem List.  SH:  Social History   Socioeconomic History   Marital status: Married    Spouse name: Not on file   Number of children: Not on file   Years of education: Not on file   Highest education level: Not on file  Occupational History  Not on file  Tobacco Use   Smoking status: Never   Smokeless tobacco: Never  Vaping Use   Vaping status: Never Used  Substance and Sexual Activity   Alcohol use: Not Currently   Drug use: No   Sexual activity: Not Currently  Other Topics Concern   Not on file  Social History Narrative   Not on file   Social Drivers of Health   Financial Resource Strain: Low Risk  (10/24/2023)   Received from [facility]   Overall Financial Resource Strain (CARDIA)    Difficulty of Paying Living Expenses: Not hard at all  Food Insecurity: No Food Insecurity  (01/19/2024)   Hunger Vital Sign    Worried About Running Out of Food in the Last Year: Never true    Ran Out of Food in the Last Year: Never true  Transportation Needs: No Transportation Needs (01/19/2024)   PRAPARE - Administrator, Civil Service (Medical): No    Lack of Transportation (Non-Medical): No  Physical Activity: Not on file  Stress: Not on file  Social Connections: Moderately Integrated (01/19/2024)   Social Connection and Isolation Panel    Frequency of Communication with Friends and Family: More than three times a week    Frequency of Social Gatherings with Friends and Family: More than three times a week    Attends Religious Services: More than 4 times per year    Active Member of [employer] or Organizations: No    Attends Banker Meetings: Never    Marital Status: Married  Catering manager Violence: Not At Risk (01/19/2024)   Humiliation, Afraid, Rape, and Kick questionnaire    Fear of Current or Ex-Partner: No    Emotionally Abused: No    Physically Abused: No    Sexually Abused: No    FH:  Family History  Problem Relation Age of Onset   Parkinson's disease Mother    Lupus Mother    Heart disease Father     Past Medical History:  Diagnosis Date   Arrhythmia    atrial fibrillation   CHF (congestive heart failure) (HCC)    Coronary artery disease    Diabetes mellitus without complication (HCC)    GERD (gastroesophageal reflux disease)    Hypercholesteremia    Hypertension    Sleep apnea    Stroke (HCC)    2015 or longer ago    Current Outpatient Medications  Medication Sig Dispense Refill   apixaban  (ELIQUIS ) 5 MG TABS tablet Take 1 tablet (5 mg total) by mouth 2 (two) times daily. 180 tablet 3   atorvastatin  (LIPITOR ) 80 MG tablet Take 1 tablet (80 mg total) by mouth daily. 30 tablet 5   calcitRIOL (ROCALTROL) 0.25 MCG capsule Take 0.25 mcg by mouth.     Continuous Glucose Sensor (FREESTYLE LIBRE 2 SENSOR) MISC by Does not apply  route.     Insulin  Pen Needle (COMFORT EZ PEN NEEDLES) 32G X 8 MM MISC by Other route.     metFORMIN  (GLUCOPHAGE ) 500 MG tablet Take 500 mg by mouth 2 (two) times daily.     metolazone  (ZAROXOLYN ) 2.5 MG tablet Take 1 tablet (2.5 mg total) by mouth daily as needed (worsening edema, SOB). 30 tablet 0   metoprolol  succinate (TOPROL -XL) 25 MG 24 hr tablet TAKE 1/2 TABLET BY MOUTH EVERY EVENING 45 tablet 3   omeprazole (PRILOSEC) 40 MG capsule Take 40 mg by mouth 2 (two) times daily.     ondansetron  (  ZOFRAN -ODT) 4 MG disintegrating tablet Take 1 tablet (4 mg total) by mouth every 8 (eight) hours as needed for nausea or vomiting. 12 tablet 0   Semaglutide,0.25 or 0.5MG /DOS, 2 MG/3ML SOPN Inject 0.25 mg into the skin once a week.     spironolactone  (ALDACTONE ) 25 MG tablet TAKE 1 TABLET (25 MG TOTAL) BY MOUTH DAILY. 90 tablet 3   tamsulosin  (FLOMAX ) 0.4 MG CAPS capsule Take 1 capsule (0.4 mg total) by mouth daily after supper.     torsemide  (DEMADEX ) 20 MG tablet Take 2 tablets (40 mg total) by mouth daily. 60 tablet 0   No current facility-administered medications for this visit.   There were no vitals filed for this visit.  Wt Readings from Last 3 Encounters:  01/22/24 176 lb 3.2 oz (79.9 kg)  12/24/23 183 lb (83 kg)  12/21/23 182 lb (82.6 kg)   Lab Results  Component Value Date   CREATININE 2.06 (H) 01/22/2024   CREATININE 1.98 (H) 01/21/2024   CREATININE 1.96 (H) 01/20/2024    PHYSICAL EXAM:  General: Well appearing.  Cor: No JVD. Regular rhythm, rate.  Lungs: clear Abdomen: soft, nontender, nondistended. Extremities: no edema Neuro:. Affect pleasant   ECG: not done   ASSESSMENT & PLAN:  1: Chronic ischemic heart failure with reduced ejection fraction- - previous CABG 10/22 - NYHA class III - euvolemic - weight stable from last visit here 2 weeks ago - Echo 05/16/21: EF <20% along with mild/moderate LAE and mild/moderate MR.  - Echo 09/16/21: EF 30-35% along with mild  MR. - Echo 06/29/22: EF 25% with moderate MR.   - LHC 02/10/21: 2v CAD, including 100% midRCA, 70% midLAD with 80% focal distal LAD, 70% D1  elevated LVEDP ( )  - Cardiac MRI 01/27/21: 1. The left ventricle is mildly dilated. Wall thickness is normal. The inferior wall is akinetic. The basal septum is severely hypokinetic. The mid-distal septum is moderately hypokinetic. The basal anterior wall is severely hypokinetic. The mid-distal anterior wall and apex are moderately hypokinetic. The lateral wall is moderately. hypokinetic. 2. The right ventricle is normal in cavity size and wall thickness. There is mild global hypokinesis with mildly reduced RV systolic function. The RVEF is calculated at 43%.  - not adding salt and had been reading food labels for sodium content  - encouraged to get compression socks and wear them daily with removal at bedtime - continue losartan  12.5mg  daily - continue metoprolol  succinate 12.5mg  daily - continue spironolactone  25mg  daily  - continue torsemide  20mg  daily with additional 20mg  if needed for weight gain, swelling/ SOB  - allergic to empagliflozin with a yeast infection - saw ADHF provider Larance) 02/24 - saw EP Marny) 03/24 - cardiac contractility modulator placed 05/24  - had AICD implanted 06/22/21; no shocks have been delivered - BNP 07/21/22 was 837.9. BNP today  2: HTN- - BP 91/61, no dizziness - saw PCP Sonny) 08/25 - BMP 12/14/23 reviewed: sodium 135, potassium 3.7, creatinine 2.11 & GFR 32 - BMET today  3: DM with CKD- - A1c 10/24/23 was 7.6% - continue ozempic 0.5mg  weekly. Encouraged to discuss usage with PCP as  - continue metformin  - saw nephrology Geoffry) 07/25  4: Atrial fibrillation- - saw EP Marny) 08/24 - continue metoprolol  succinate 12.5mg  daily - continue apixaban  5mg  BID - no bleeding episodes  -if he has recurrent AF, would favor ablation. Have been hesitant so far given history of HIT which would complicate  anticoagulation.   5: CAD- -  saw cardiology Melva) 07/25 - CABG with a LIMA to LAD, SVG to ramus, and SVG to PDA on 02/16/21 - continue atorvastatin  80 mg daily.  - LDL 07/16/23 was 52 - no ASA given stable CAD and apixaban  use   Return in 5 months, sooner if needed.   Ellouise DELENA Class, FNP 01/24/24

## 2024-01-25 ENCOUNTER — Other Ambulatory Visit (HOSPITAL_COMMUNITY): Payer: Self-pay

## 2024-01-25 ENCOUNTER — Other Ambulatory Visit: Payer: Self-pay

## 2024-01-25 ENCOUNTER — Encounter: Payer: Self-pay | Admitting: Family

## 2024-01-25 ENCOUNTER — Ambulatory Visit: Attending: Family | Admitting: Family

## 2024-01-25 VITALS — BP 84/65 | HR 78 | Wt 181.0 lb

## 2024-01-25 DIAGNOSIS — Z820 Family history of epilepsy and other diseases of the nervous system: Secondary | ICD-10-CM | POA: Diagnosis not present

## 2024-01-25 DIAGNOSIS — I255 Ischemic cardiomyopathy: Secondary | ICD-10-CM | POA: Diagnosis not present

## 2024-01-25 DIAGNOSIS — Z7901 Long term (current) use of anticoagulants: Secondary | ICD-10-CM | POA: Insufficient documentation

## 2024-01-25 DIAGNOSIS — Z951 Presence of aortocoronary bypass graft: Secondary | ICD-10-CM | POA: Diagnosis not present

## 2024-01-25 DIAGNOSIS — G4730 Sleep apnea, unspecified: Secondary | ICD-10-CM | POA: Insufficient documentation

## 2024-01-25 DIAGNOSIS — N189 Chronic kidney disease, unspecified: Secondary | ICD-10-CM | POA: Insufficient documentation

## 2024-01-25 DIAGNOSIS — Z856 Personal history of leukemia: Secondary | ICD-10-CM | POA: Insufficient documentation

## 2024-01-25 DIAGNOSIS — Z8269 Family history of other diseases of the musculoskeletal system and connective tissue: Secondary | ICD-10-CM | POA: Insufficient documentation

## 2024-01-25 DIAGNOSIS — Z79899 Other long term (current) drug therapy: Secondary | ICD-10-CM | POA: Diagnosis not present

## 2024-01-25 DIAGNOSIS — I4891 Unspecified atrial fibrillation: Secondary | ICD-10-CM | POA: Diagnosis not present

## 2024-01-25 DIAGNOSIS — I10 Essential (primary) hypertension: Secondary | ICD-10-CM

## 2024-01-25 DIAGNOSIS — N1832 Chronic kidney disease, stage 3b: Secondary | ICD-10-CM

## 2024-01-25 DIAGNOSIS — Z95810 Presence of automatic (implantable) cardiac defibrillator: Secondary | ICD-10-CM | POA: Insufficient documentation

## 2024-01-25 DIAGNOSIS — E7800 Pure hypercholesterolemia, unspecified: Secondary | ICD-10-CM | POA: Insufficient documentation

## 2024-01-25 DIAGNOSIS — Z8249 Family history of ischemic heart disease and other diseases of the circulatory system: Secondary | ICD-10-CM | POA: Diagnosis not present

## 2024-01-25 DIAGNOSIS — I130 Hypertensive heart and chronic kidney disease with heart failure and stage 1 through stage 4 chronic kidney disease, or unspecified chronic kidney disease: Secondary | ICD-10-CM | POA: Diagnosis present

## 2024-01-25 DIAGNOSIS — I480 Paroxysmal atrial fibrillation: Secondary | ICD-10-CM | POA: Diagnosis not present

## 2024-01-25 DIAGNOSIS — E1122 Type 2 diabetes mellitus with diabetic chronic kidney disease: Secondary | ICD-10-CM | POA: Diagnosis not present

## 2024-01-25 DIAGNOSIS — I2510 Atherosclerotic heart disease of native coronary artery without angina pectoris: Secondary | ICD-10-CM | POA: Diagnosis not present

## 2024-01-25 DIAGNOSIS — I5020 Unspecified systolic (congestive) heart failure: Secondary | ICD-10-CM | POA: Diagnosis not present

## 2024-01-25 DIAGNOSIS — Z7984 Long term (current) use of oral hypoglycemic drugs: Secondary | ICD-10-CM | POA: Insufficient documentation

## 2024-01-25 DIAGNOSIS — Z8673 Personal history of transient ischemic attack (TIA), and cerebral infarction without residual deficits: Secondary | ICD-10-CM | POA: Diagnosis not present

## 2024-01-25 MED ORDER — POTASSIUM CHLORIDE CRYS ER 20 MEQ PO TBCR: 20.0000 meq | EXTENDED_RELEASE_TABLET | Freq: Every day | ORAL | 0 refills | Status: DC | PRN

## 2024-01-25 MED ORDER — FUROSCIX 80 MG/10ML ~~LOC~~ CTKT
CARTRIDGE | SUBCUTANEOUS | 0 refills | Status: DC
  Filled 2024-01-25: qty 5, fill #0

## 2024-01-25 MED ORDER — POTASSIUM CHLORIDE CRYS ER 20 MEQ PO TBCR: 40.0000 meq | EXTENDED_RELEASE_TABLET | ORAL | 0 refills | Status: DC

## 2024-01-25 MED ORDER — MIDODRINE HCL 2.5 MG PO TABS: 2.5000 mg | ORAL_TABLET | Freq: Two times a day (BID) | ORAL | 5 refills | Status: DC

## 2024-01-25 NOTE — Patient Instructions (Signed)
 It was good to see you today!  If you receive a satisfaction survey regarding the Heart Failure Clinic, please take the time to fill it out. This way we can continue to provide excellent care and make any changes that need to be made.   Medication Changes:  STOP Losartan    START Midrodrine 2.5mg  (1 tab) two times daily  TAKE TODAY Furoscix  with 40meq (2 tabs) of Potassium. You may repeat repeat this on Sunday if needed (with 2 tabs of potassium). DO NOT TAKE Torsemide  the same day as you are taking Furoscix .   Follow-Up in: Please follow up with the Advanced Heart Failure Clinic in 1 weeks with Ellouise Class, FNP.   Thank you for choosing [facility].    At the Advanced Heart Failure Clinic, you and your health needs are our priority. We have a designated team specialized in the treatment of Heart Failure. This Care Team includes your primary Heart Failure Specialized Cardiologist (physician), Advanced Practice Providers (APPs- Physician Assistants and Nurse Practitioners), and Pharmacist who all work together to provide you with the care you need, when you need it.   You may see any of the following providers on your designated Care Team at your next follow up:  Dr. Toribio Fuel Dr. Ezra Shuck Dr. Ria Commander Dr. Morene Brownie Ellouise Class, FNP Jaun Bash, RPH-CPP  Please be sure to bring in all your medications bottles to every appointment.   Need to Contact Us :  If you have any questions or concerns before your next appointment please send us  a message through [facility] or call our office at [phone].    TO LEAVE A MESSAGE FOR THE NURSE SELECT OPTION 2, PLEASE LEAVE A MESSAGE INCLUDING: YOUR NAME DATE OF BIRTH CALL BACK NUMBER REASON FOR CALL**this is important as we prioritize the call backs  YOU WILL RECEIVE A CALL BACK THE SAME DAY AS LONG AS YOU CALL BEFORE 4:00 PM

## 2024-01-31 ENCOUNTER — Telehealth: Payer: Self-pay | Admitting: Family

## 2024-01-31 NOTE — Telephone Encounter (Signed)
 Called to confirm/remind patient of their appointment at the Advanced [facility] Failure Clinic on 02/01/24.   Appointment:   [x] Confirmed  [] Left mess   [] No answer/No voice mail  [] VM Full/unable to leave message  [] Phone not in service  Patient reminded to bring all medications and/or complete list.  Confirmed patient has transportation. Gave directions, instructed to utilize valet parking.

## 2024-01-31 NOTE — Progress Notes (Unsigned)
 Advanced Heart Failure Clinic Note    PCP: Perri Constance Sor, PA-C  Primary Cardiologist: Ammon Blunt, MD/ Clarisa Kung, [place] (last seen 05/25; returns 11/25)   Chief Complaint: shortness of breath   HPI:  Mr Jermaine Kramer is a 73 y/o male with a history of leukemia, DM, mesenteric panniculitis, CAD (CABG 02/16/21), AF, sleep apnea, hyperlipidemia, HTN, stroke, GERD & chronic heart failure. Had CCM generator with A/V lead insertion on 09/04/22.  Was in the ED 04/07/22 due to cough/ SOB.   Echo 05/16/21: EF <20% along with mild/moderate LAE and mild/moderate MR.  Echo 09/16/21: EF 30-35% along with mild MR.  Admitted 06/09/22 due to LLQ abdominal pain. CT showed mesenteric pancolitis.   Echo 06/29/22: EF 25% with moderate MR.    Was in the ED 12/22/22 due to left flank pain over the last 24 hours. Abdominal CT negative. Reassuring CBC with a normal white blood cell count, reassuring chemistry with normal LFTs. Normal lipase. Urinalysis shows no concerning findings.     Was in the ED 04/05/23 due to weakness thought to be due to ozempic use. Ozempic stopped.   Was in the ED 04/16/23 due to hypotension. SBP in the 70's. Has had decreased fluid intake since ozempic usage which was stopped 2 weeks prior. Oral intake improving but still with nausea. Labs show no significant anemia, leukocytosis, electrolyte abnormality, or AKI. EKG shows no evidence of arrhythmia or ischemia.   Seen in [facility] 12/03/23 due to being fluid up. Was given furoscix  X 2 days. Patient called the office the following day down 3 pounds and feeling much better. Asking if they still needed to take the 2nd fuoscix due to renal function. Advised to hold it and use if needed later in the week.   Admitted 01/19/24 with worsening SOB/ pedal edema over the preceding week even after increasing home diuretics. IV diuresed. Cardiology consulted.   At last visit 01/25/24, he was instructed to use 1 furoscix  and can use 2nd one if needed.  Losartan  stopped and midodrine  started due to hypotension.   He presents today, with his wife, for a HF follow-up visit with a chief complaint of shortness of breath, unchanged. Has associated fatigue, sporadic chest tightness, abdominal distention, intermittent trouble sleeping, periodic leg cramps. Reports improvement in his appetite since GLP1 was stopped. Still has some nausea but is eating better.    Previous cardiac studies:   LHC 02/10/21:  2v CAD, including 100% midRCA, 70% midLAD with 80% focal distal LAD, 70% D1  Elevated LVEDP ( )   Cardiac MRI 01/27/21:  1. The left ventricle is mildly dilated.  Wall thickness is normal.     a.  The inferior wall is akinetic.     b.  The basal septum is severely hypokinetic.  The mid-distal septum is moderately hypokinetic.     c.  The basal anterior wall is severely hypokinetic. The mid-distal anterior wall and apex are  moderately hypokinetic.     d.  The lateral wall is moderately. hypokinetic.   Global LV systolic function is severely reduced with an LVEF calculated at 30%.    2. The right ventricle is normal in cavity size and wall thickness.  There is mild global hypokinesis with  mildly reduced RV systolic function.  The RVEF is calculated at 43%.   ROS: All systems negative except as listed in HPI, PMH and Problem List.  SH:  Social History   Socioeconomic History   Marital status: Married  Spouse name: Not on file   Number of children: Not on file   Years of education: Not on file   Highest education level: Not on file  Occupational History   Not on file  Tobacco Use   Smoking status: Never   Smokeless tobacco: Never  Vaping Use   Vaping status: Never Used  Substance and Sexual Activity   Alcohol use: Not Currently   Drug use: No   Sexual activity: Not Currently  Other Topics Concern   Not on file  Social History Narrative   Not on file   Social Drivers of Health   Financial Resource Strain: Low Risk   (10/24/2023)   Received from [facility]   Overall Financial Resource Strain (CARDIA)    Difficulty of Paying Living Expenses: Not hard at all  Food Insecurity: No Food Insecurity (01/19/2024)   Hunger Vital Sign    Worried About Running Out of Food in the Last Year: Never true    Ran Out of Food in the Last Year: Never true  Transportation Needs: No Transportation Needs (01/19/2024)   PRAPARE - Administrator, Civil Service (Medical): No    Lack of Transportation (Non-Medical): No  Physical Activity: Not on file  Stress: Not on file  Social Connections: Moderately Integrated (01/19/2024)   Social Connection and Isolation Panel    Frequency of Communication with Friends and Family: More than three times a week    Frequency of Social Gatherings with Friends and Family: More than three times a week    Attends Religious Services: More than 4 times per year    Active Member of [employer] or Organizations: No    Attends Banker Meetings: Never    Marital Status: Married  Catering manager Violence: Not At Risk (01/19/2024)   Humiliation, Afraid, Rape, and Kick questionnaire    Fear of Current or Ex-Partner: No    Emotionally Abused: No    Physically Abused: No    Sexually Abused: No    FH:  Family History  Problem Relation Age of Onset   Parkinson's disease Mother    Lupus Mother    Heart disease Father     Past Medical History:  Diagnosis Date   Arrhythmia    atrial fibrillation   CHF (congestive heart failure) (HCC)    Coronary artery disease    Diabetes mellitus without complication (HCC)    GERD (gastroesophageal reflux disease)    Hypercholesteremia    Hypertension    Sleep apnea    Stroke (HCC)    2015 or longer ago    Current Outpatient Medications  Medication Sig Dispense Refill   apixaban  (ELIQUIS ) 5 MG TABS tablet Take 1 tablet (5 mg total) by mouth 2 (two) times daily. 180 tablet 3   atorvastatin  (LIPITOR ) 80 MG tablet Take  1 tablet (80 mg total) by mouth daily. 30 tablet 5   calcitRIOL (ROCALTROL) 0.25 MCG capsule Take 0.25 mcg by mouth.     Continuous Glucose Sensor (FREESTYLE LIBRE 2 SENSOR) MISC by Does not apply route.     Furosemide  (FUROSCIX ) 80 MG/10ML CTKT Take 80mg  daily as directed. 5 each 0   Insulin  Pen Needle (COMFORT EZ PEN NEEDLES) 32G X 8 MM MISC by Other route.     metFORMIN  (GLUCOPHAGE ) 500 MG tablet Take 500 mg by mouth 2 (two) times daily.     metolazone  (ZAROXOLYN ) 2.5 MG tablet Take 1 tablet (2.5 mg total) by mouth daily  as needed (worsening edema, SOB). 30 tablet 0   metoprolol  succinate (TOPROL -XL) 25 MG 24 hr tablet TAKE 1/2 TABLET BY MOUTH EVERY EVENING 45 tablet 3   midodrine  (PROAMATINE ) 2.5 MG tablet Take 1 tablet (2.5 mg total) by mouth 2 (two) times daily with a meal. 60 tablet 5   omeprazole (PRILOSEC) 40 MG capsule Take 40 mg by mouth 2 (two) times daily.     ondansetron  (ZOFRAN -ODT) 4 MG disintegrating tablet Take 1 tablet (4 mg total) by mouth every 8 (eight) hours as needed for nausea or vomiting. 12 tablet 0   potassium chloride  SA (KLOR-CON  M) 20 MEQ tablet Take 2 tablets (40 mEq total) by mouth as directed. By heart failure clinic 60 tablet 0   Semaglutide,0.25 or 0.5MG /DOS, 2 MG/3ML SOPN Inject 0.25 mg into the skin once a week.     spironolactone  (ALDACTONE ) 25 MG tablet TAKE 1 TABLET (25 MG TOTAL) BY MOUTH DAILY. (Patient taking differently: Take 12.5 mg by mouth daily.) 90 tablet 3   tamsulosin  (FLOMAX ) 0.4 MG CAPS capsule Take 1 capsule (0.4 mg total) by mouth daily after supper.     torsemide  (DEMADEX ) 20 MG tablet Take 2 tablets (40 mg total) by mouth daily. 60 tablet 0   No current facility-administered medications for this visit.   Vitals:   02/01/24 1036  BP: 103/70  Pulse: 80  SpO2: 97%  Weight: 183 lb 12.8 oz (83.4 kg)   Wt Readings from Last 3 Encounters:  02/01/24 183 lb 12.8 oz (83.4 kg)  01/25/24 181 lb (82.1 kg)  01/22/24 176 lb 3.2 oz (79.9 kg)    Lab Results  Component Value Date   CREATININE 2.06 (H) 01/22/2024   CREATININE 1.98 (H) 01/21/2024   CREATININE 1.96 (H) 01/20/2024    PHYSICAL EXAM:  General: Well appearing.  Cor: No JVD. Regular rhythm, rate.  Lungs: clear Abdomen: soft, nontender, distended.  Extremities: no edema Neuro:. 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 Reports improved appetite. - used 1 dose of furoscix  since last here last week & feels like it helped. 1 sample provided today so that he can have it on hand - Cardiac MRI 01/27/21: 1. The left ventricle is mildly dilated. Wall thickness is normal. The inferior wall is akinetic. The basal septum is severely hypokinetic. The mid-distal septum is moderately hypokinetic. The basal anterior wall is severely hypokinetic. The mid-distal anterior wall and apex are moderately hypokinetic. The lateral wall is moderately. hypokinetic. 2. The right ventricle is normal in cavity size and wall thickness. There is mild global hypokinesis with mildly reduced RV systolic function. The RVEF is calculated at 43%. - Echo 05/16/21: EF <20% along with mild/moderate LAE and mild/moderate MR.  - Echo 09/16/21: EF 30-35% along with mild MR. - Echo 06/29/22: EF 25% with moderate MR.   - will get echo updated - BMET, BNP, Mg today (patient having leg cramps) - not adding salt and had been reading food labels for sodium content  - encouraged to get compression socks and wear them daily with removal at bedtime - stopped losartan  at last visit - continue metoprolol  succinate 12.5mg  daily - continue spironolactone  12.5mg  daily  - continue torsemide  20mg  daily with additional 20mg  if needed for weight gain, swelling/ SOB.  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   5: CAD- - saw cardiology Jermaine Kramer) 05/25 - CABG with a LIMA to LAD, SVG to ramus, and SVG to PDA on 02/16/21 - LHC 02/10/21: 2v CAD, including 100% midRCA, 70% midLAD with 80% focal distal LAD, 70% D1 elevated LVEDP ( )  - continue atorvastatin  80 mg daily.  - LDL 07/16/23 was 52 - no ASA given stable CAD and apixaban  use   Keep already scheduled appt 01/26, return sooner if needed.   I spent 22 minutes reviewing records, interviewing/ examing patient and managing plan/ orders.   Jermaine DELENA Class, FNP 01/31/24

## 2024-02-01 ENCOUNTER — Encounter: Payer: Self-pay | Admitting: Family

## 2024-02-01 ENCOUNTER — Ambulatory Visit: Attending: Family | Admitting: Family

## 2024-02-01 VITALS — BP 103/70 | HR 80 | Wt 183.8 lb

## 2024-02-01 DIAGNOSIS — I130 Hypertensive heart and chronic kidney disease with heart failure and stage 1 through stage 4 chronic kidney disease, or unspecified chronic kidney disease: Secondary | ICD-10-CM | POA: Diagnosis not present

## 2024-02-01 DIAGNOSIS — R110 Nausea: Secondary | ICD-10-CM | POA: Diagnosis not present

## 2024-02-01 DIAGNOSIS — E7800 Pure hypercholesterolemia, unspecified: Secondary | ICD-10-CM | POA: Diagnosis not present

## 2024-02-01 DIAGNOSIS — Z7984 Long term (current) use of oral hypoglycemic drugs: Secondary | ICD-10-CM | POA: Insufficient documentation

## 2024-02-01 DIAGNOSIS — Z8673 Personal history of transient ischemic attack (TIA), and cerebral infarction without residual deficits: Secondary | ICD-10-CM | POA: Insufficient documentation

## 2024-02-01 DIAGNOSIS — I4891 Unspecified atrial fibrillation: Secondary | ICD-10-CM | POA: Diagnosis not present

## 2024-02-01 DIAGNOSIS — I480 Paroxysmal atrial fibrillation: Secondary | ICD-10-CM

## 2024-02-01 DIAGNOSIS — Z79899 Other long term (current) drug therapy: Secondary | ICD-10-CM | POA: Insufficient documentation

## 2024-02-01 DIAGNOSIS — I10 Essential (primary) hypertension: Secondary | ICD-10-CM | POA: Diagnosis not present

## 2024-02-01 DIAGNOSIS — Z95810 Presence of automatic (implantable) cardiac defibrillator: Secondary | ICD-10-CM | POA: Diagnosis not present

## 2024-02-01 DIAGNOSIS — N189 Chronic kidney disease, unspecified: Secondary | ICD-10-CM | POA: Diagnosis not present

## 2024-02-01 DIAGNOSIS — K654 Sclerosing mesenteritis: Secondary | ICD-10-CM | POA: Insufficient documentation

## 2024-02-01 DIAGNOSIS — E1122 Type 2 diabetes mellitus with diabetic chronic kidney disease: Secondary | ICD-10-CM

## 2024-02-01 DIAGNOSIS — K219 Gastro-esophageal reflux disease without esophagitis: Secondary | ICD-10-CM | POA: Diagnosis not present

## 2024-02-01 DIAGNOSIS — Z856 Personal history of leukemia: Secondary | ICD-10-CM | POA: Diagnosis not present

## 2024-02-01 DIAGNOSIS — N1832 Chronic kidney disease, stage 3b: Secondary | ICD-10-CM

## 2024-02-01 DIAGNOSIS — I2510 Atherosclerotic heart disease of native coronary artery without angina pectoris: Secondary | ICD-10-CM | POA: Diagnosis not present

## 2024-02-01 DIAGNOSIS — R5383 Other fatigue: Secondary | ICD-10-CM | POA: Diagnosis not present

## 2024-02-01 DIAGNOSIS — Z7901 Long term (current) use of anticoagulants: Secondary | ICD-10-CM | POA: Insufficient documentation

## 2024-02-01 DIAGNOSIS — I5022 Chronic systolic (congestive) heart failure: Secondary | ICD-10-CM | POA: Diagnosis not present

## 2024-02-01 DIAGNOSIS — G4730 Sleep apnea, unspecified: Secondary | ICD-10-CM | POA: Diagnosis not present

## 2024-02-01 DIAGNOSIS — Z951 Presence of aortocoronary bypass graft: Secondary | ICD-10-CM | POA: Insufficient documentation

## 2024-02-01 DIAGNOSIS — R0789 Other chest pain: Secondary | ICD-10-CM | POA: Insufficient documentation

## 2024-02-01 DIAGNOSIS — R252 Cramp and spasm: Secondary | ICD-10-CM | POA: Insufficient documentation

## 2024-02-01 NOTE — Patient Instructions (Addendum)
 It was good to see you today.  If you receive a satisfaction survey regarding the Heart Failure Clinic, please take the time to fill it out. This way we can continue to provide excellent care and make any changes that need to be made.   Medication Changes:  No medication changes today!  Lab Work: Go downstairs to [facility] on [address] to have your blood work completed.  We will only call you if the results are abnormal or if the provider would like to make medication changes.  No news is good news.     Testing/Procedures:  Your physician has requested that you have an echocardiogram. Echocardiography is a painless test that uses sound waves to create images of your heart. It provides your doctor with information about the size and shape of your heart and how well your heart's chambers and valves are working. This procedure takes approximately one hour. There are no restrictions for this procedure. Please do NOT wear cologne, perfume, aftershave, or lotions (deodorant is allowed). Please arrive 15 minutes prior to your appointment time.  Please note: We ask at that you not bring children with you during ultrasound (echo/ vascular) testing. Due to room size and safety concerns, children are not allowed in the ultrasound rooms during exams. Our front office staff cannot provide observation of children in our lobby area while testing is being conducted. An adult accompanying a patient to their appointment will only be allowed in the ultrasound room at the discretion of the ultrasound technician under special circumstances. We apologize for any inconvenience.  Someone will contact you in order to schedule your appointment.   Follow-Up in: Please follow up with the Advanced Heart Failure Clinic in January.   Thank you for choosing [facility].    At the Advanced Heart Failure Clinic, you and your health needs are our priority. We have a designated  team specialized in the treatment of Heart Failure. This Care Team includes your primary Heart Failure Specialized Cardiologist (physician), Advanced Practice Providers (APPs- Physician Assistants and Nurse Practitioners), and Pharmacist who all work together to provide you with the care you need, when you need it.   You may see any of the following providers on your designated Care Team at your next follow up:  Dr. Toribio Fuel Dr. Ezra Shuck Dr. Ria Commander Dr. Morene Brownie Ellouise Class, FNP Jaun Bash, RPH-CPP  Please be sure to bring in all your medications bottles to every appointment.   Need to Contact Us :  If you have any questions or concerns before your next appointment please send us  a message through [place] or call our office at [phone].    TO LEAVE A MESSAGE FOR THE NURSE SELECT OPTION 2, PLEASE LEAVE A MESSAGE INCLUDING: YOUR NAME DATE OF BIRTH CALL BACK NUMBER REASON FOR CALL**this is important as we prioritize the call backs  YOU WILL RECEIVE A CALL BACK THE SAME DAY AS LONG AS YOU CALL BEFORE 4:00 PM

## 2024-02-03 ENCOUNTER — Ambulatory Visit: Payer: Self-pay | Admitting: Family

## 2024-02-03 LAB — BASIC METABOLIC PANEL WITH GFR
BUN/Creatinine Ratio: 12 (ref 10–24)
BUN: 25 mg/dL (ref 8–27)
CO2: 20 mmol/L (ref 20–29)
Calcium: 9.4 mg/dL (ref 8.6–10.2)
Chloride: 99 mmol/L (ref 96–106)
Creatinine, Ser: 2.1 mg/dL — ABNORMAL HIGH (ref 0.76–1.27)
Glucose: 175 mg/dL — ABNORMAL HIGH (ref 70–99)
Potassium: 4 mmol/L (ref 3.5–5.2)
Sodium: 138 mmol/L (ref 134–144)
eGFR: 32 mL/min/1.73 — ABNORMAL LOW (ref >59)

## 2024-02-03 LAB — BRAIN NATRIURETIC PEPTIDE: BNP: 941.8 pg/mL — ABNORMAL HIGH (ref 0.0–100.0)

## 2024-02-03 LAB — MAGNESIUM: Magnesium: 1.6 mg/dL (ref 1.6–2.3)

## 2024-02-06 ENCOUNTER — Telehealth: Payer: Self-pay | Admitting: Family

## 2024-02-07 ENCOUNTER — Ambulatory Visit (HOSPITAL_BASED_OUTPATIENT_CLINIC_OR_DEPARTMENT_OTHER): Admitting: Family

## 2024-02-07 ENCOUNTER — Telehealth: Payer: Self-pay

## 2024-02-07 ENCOUNTER — Encounter: Payer: Self-pay | Admitting: Family

## 2024-02-07 VITALS — BP 95/75 | HR 85 | Wt 187.0 lb

## 2024-02-07 DIAGNOSIS — G4730 Sleep apnea, unspecified: Secondary | ICD-10-CM | POA: Insufficient documentation

## 2024-02-07 DIAGNOSIS — I2510 Atherosclerotic heart disease of native coronary artery without angina pectoris: Secondary | ICD-10-CM | POA: Insufficient documentation

## 2024-02-07 DIAGNOSIS — I5022 Chronic systolic (congestive) heart failure: Secondary | ICD-10-CM | POA: Insufficient documentation

## 2024-02-07 DIAGNOSIS — Z951 Presence of aortocoronary bypass graft: Secondary | ICD-10-CM | POA: Insufficient documentation

## 2024-02-07 DIAGNOSIS — R0602 Shortness of breath: Secondary | ICD-10-CM | POA: Insufficient documentation

## 2024-02-07 DIAGNOSIS — I509 Heart failure, unspecified: Secondary | ICD-10-CM | POA: Diagnosis not present

## 2024-02-07 DIAGNOSIS — I255 Ischemic cardiomyopathy: Secondary | ICD-10-CM | POA: Insufficient documentation

## 2024-02-07 DIAGNOSIS — I130 Hypertensive heart and chronic kidney disease with heart failure and stage 1 through stage 4 chronic kidney disease, or unspecified chronic kidney disease: Secondary | ICD-10-CM | POA: Insufficient documentation

## 2024-02-07 DIAGNOSIS — I10 Essential (primary) hypertension: Secondary | ICD-10-CM | POA: Diagnosis not present

## 2024-02-07 DIAGNOSIS — Z856 Personal history of leukemia: Secondary | ICD-10-CM | POA: Insufficient documentation

## 2024-02-07 DIAGNOSIS — Z95810 Presence of automatic (implantable) cardiac defibrillator: Secondary | ICD-10-CM | POA: Insufficient documentation

## 2024-02-07 DIAGNOSIS — Z8673 Personal history of transient ischemic attack (TIA), and cerebral infarction without residual deficits: Secondary | ICD-10-CM | POA: Insufficient documentation

## 2024-02-07 DIAGNOSIS — I480 Paroxysmal atrial fibrillation: Secondary | ICD-10-CM

## 2024-02-07 DIAGNOSIS — Z7984 Long term (current) use of oral hypoglycemic drugs: Secondary | ICD-10-CM | POA: Insufficient documentation

## 2024-02-07 DIAGNOSIS — Z7901 Long term (current) use of anticoagulants: Secondary | ICD-10-CM | POA: Insufficient documentation

## 2024-02-07 DIAGNOSIS — R140 Abdominal distension (gaseous): Secondary | ICD-10-CM | POA: Insufficient documentation

## 2024-02-07 DIAGNOSIS — N1832 Chronic kidney disease, stage 3b: Secondary | ICD-10-CM

## 2024-02-07 DIAGNOSIS — Z79899 Other long term (current) drug therapy: Secondary | ICD-10-CM | POA: Insufficient documentation

## 2024-02-07 DIAGNOSIS — R0789 Other chest pain: Secondary | ICD-10-CM | POA: Insufficient documentation

## 2024-02-07 DIAGNOSIS — N189 Chronic kidney disease, unspecified: Secondary | ICD-10-CM | POA: Insufficient documentation

## 2024-02-07 DIAGNOSIS — I4891 Unspecified atrial fibrillation: Secondary | ICD-10-CM | POA: Insufficient documentation

## 2024-02-07 DIAGNOSIS — E1122 Type 2 diabetes mellitus with diabetic chronic kidney disease: Secondary | ICD-10-CM | POA: Diagnosis not present

## 2024-02-07 DIAGNOSIS — R5383 Other fatigue: Secondary | ICD-10-CM | POA: Insufficient documentation

## 2024-02-07 NOTE — Telephone Encounter (Signed)
 Pt wife called stating pt is short of breath. States that he had a 1lbs weight gain over night but a 3-4lb weight gain over the last few days. States the pt is wheezing. Would like to be seen. Discussed with provider and scheduled.

## 2024-02-07 NOTE — Patient Instructions (Signed)
 Medication Changes:  TAKE Furoscix  and Potassium Tomorrow and Saturday. Do not take Torsemide  tomorrow or Saturday   Follow-Up in: Please follow up with the Advanced Heart Failure Clinic on Monday February 11 2024.   Thank you for choosing [facility].    At the Advanced Heart Failure Clinic, you and your health needs are our priority. We have a designated team specialized in the treatment of Heart Failure. This Care Team includes your primary Heart Failure Specialized Cardiologist (physician), Advanced Practice Providers (APPs- Physician Assistants and Nurse Practitioners), and Pharmacist who all work together to provide you with the care you need, when you need it.   You may see any of the following providers on your designated Care Team at your next follow up:  Dr. Toribio Fuel Dr. Ezra Shuck Dr. Ria Commander Dr. Morene Brownie Ellouise Class, FNP Jaun Bash, RPH-CPP  Please be sure to bring in all your medications bottles to every appointment.   Need to Contact Us :  If you have any questions or concerns before your next appointment please send us  a message through [place] or call our office at [phone].    TO LEAVE A MESSAGE FOR THE NURSE SELECT OPTION 2, PLEASE LEAVE A MESSAGE INCLUDING: YOUR NAME DATE OF BIRTH CALL BACK NUMBER REASON FOR CALL**this is important as we prioritize the call backs  YOU WILL RECEIVE A CALL BACK THE SAME DAY AS LONG AS YOU CALL BEFORE 4:00 PM

## 2024-02-07 NOTE — Progress Notes (Signed)
 Advanced [facility] Failure Clinic Note    PCP: Perri Constance Sor, PA-C  Primary Cardiologist: Ammon Blunt, MD/ Clarisa Kung, [place] (last seen 05/25; returns 11/25)   Chief Complaint: shortness of breath   HPI:  Mr Winegarden is a 73 y/o male with a history of leukemia, DM, mesenteric panniculitis, CAD (CABG 02/16/21), AF, sleep apnea, hyperlipidemia, HTN, stroke, GERD & chronic heart failure. Had CCM generator with A/V lead insertion on 09/04/22.  Was in the ED 04/07/22 due to cough/ SOB.   Echo 05/16/21: EF <20% along with mild/moderate LAE and mild/moderate MR.  Echo 09/16/21: EF 30-35% along with mild MR.  Admitted 06/09/22 due to LLQ abdominal pain. CT showed mesenteric pancolitis.   Echo 06/29/22: EF 25% with moderate MR.    Was in the ED 12/22/22 due to left flank pain over the last 24 hours. Abdominal CT negative. Reassuring CBC with a normal white blood cell count, reassuring chemistry with normal LFTs. Normal lipase. Urinalysis shows no concerning findings.     Was in the ED 04/05/23 due to weakness thought to be due to ozempic use. Ozempic stopped.   Was in the ED 04/16/23 due to hypotension. SBP in the 70's. Has had decreased fluid intake since ozempic usage which was stopped 2 weeks prior. Oral intake improving but still with nausea. Labs show no significant anemia, leukocytosis, electrolyte abnormality, or AKI. EKG shows no evidence of arrhythmia or ischemia.   Seen in [facility] 12/03/23 due to being fluid up. Was given furoscix  X 2 days. Patient called the office the following day down 3 pounds and feeling much better. Asking if they still needed to take the 2nd fuoscix due to renal function. Advised to hold it and use if needed later in the week.   Admitted 01/19/24 with worsening SOB/ pedal edema over the preceding week even after increasing home diuretics. IV diuresed. Cardiology consulted.   At last visit 01/25/24, he was instructed to use 1 furoscix  and can use 2nd one if needed.  Losartan  stopped and midodrine  started due to hypotension.   He presents today, with his wife, for an acute HF visit with 3-4 pound weight gain over the last several days, wheezing and worsening shortness of breath. Has fatigue, stable chest tightness, abdominal distention and intermittent trouble sleeping. No further leg cramps. Wife put furoscix  on him this morning prior to coming to the appointment and she says that she doesn't notice any further wheezing. She did not give him his torsemide  today since he's using furoscix .    Previous cardiac studies:   LHC 02/10/21:  2v CAD, including 100% midRCA, 70% midLAD with 80% focal distal LAD, 70% D1  Elevated LVEDP ( )   Cardiac MRI 01/27/21:  1. The left ventricle is mildly dilated.  Wall thickness is normal.     a.  The inferior wall is akinetic.     b.  The basal septum is severely hypokinetic.  The mid-distal septum is moderately hypokinetic.     c.  The basal anterior wall is severely hypokinetic. The mid-distal anterior wall and apex are  moderately hypokinetic.     d.  The lateral wall is moderately. hypokinetic.   Global LV systolic function is severely reduced with an LVEF calculated at 30%.    2. The right ventricle is normal in cavity size and wall thickness.  There is mild global hypokinesis with  mildly reduced RV systolic function.  The RVEF is calculated at 43%.   ROS: All systems negative  except as listed in HPI, PMH and Problem List.  SH:  Social History   Socioeconomic History   Marital status: Married    Spouse name: Not on file   Number of children: Not on file   Years of education: Not on file   Highest education level: Not on file  Occupational History   Not on file  Tobacco Use   Smoking status: Never   Smokeless tobacco: Never  Vaping Use   Vaping status: Never Used  Substance and Sexual Activity   Alcohol use: Not Currently   Drug use: No   Sexual activity: Not Currently  Other Topics Concern    Not on file  Social History Narrative   Not on file   Social Drivers of Health   Financial Resource Strain: Low Risk  (10/24/2023)   Received from [facility]   Overall Financial Resource Strain (CARDIA)    Difficulty of Paying Living Expenses: Not hard at all  Food Insecurity: No Food Insecurity (01/19/2024)   Hunger Vital Sign    Worried About Running Out of Food in the Last Year: Never true    Ran Out of Food in the Last Year: Never true  Transportation Needs: No Transportation Needs (01/19/2024)   PRAPARE - Administrator, Civil Service (Medical): No    Lack of Transportation (Non-Medical): No  Physical Activity: Not on file  Stress: Not on file  Social Connections: Moderately Integrated (01/19/2024)   Social Connection and Isolation Panel    Frequency of Communication with Friends and Family: More than three times a week    Frequency of Social Gatherings with Friends and Family: More than three times a week    Attends Religious Services: More than 4 times per year    Active Member of [employer] or Organizations: No    Attends Banker Meetings: Never    Marital Status: Married  Catering manager Violence: Not At Risk (01/19/2024)   Humiliation, Afraid, Rape, and Kick questionnaire    Fear of Current or Ex-Partner: No    Emotionally Abused: No    Physically Abused: No    Sexually Abused: No    FH:  Family History  Problem Relation Age of Onset   Parkinson's disease Mother    Lupus Mother    Heart disease Father     Past Medical History:  Diagnosis Date   Arrhythmia    atrial fibrillation   CHF (congestive heart failure) (HCC)    Coronary artery disease    Diabetes mellitus without complication (HCC)    GERD (gastroesophageal reflux disease)    Hypercholesteremia    Hypertension    Sleep apnea    Stroke (HCC)    2015 or longer ago    Current Outpatient Medications  Medication Sig Dispense Refill   apixaban  (ELIQUIS ) 5 MG  TABS tablet Take 1 tablet (5 mg total) by mouth 2 (two) times daily. 180 tablet 3   atorvastatin  (LIPITOR ) 80 MG tablet Take 1 tablet (80 mg total) by mouth daily. 30 tablet 5   calcitRIOL (ROCALTROL) 0.25 MCG capsule Take 0.25 mcg by mouth.     Continuous Glucose Sensor (FREESTYLE LIBRE 2 SENSOR) MISC by Does not apply route.     Furosemide  (FUROSCIX ) 80 MG/10ML CTKT Take 80mg  daily as directed. 5 each 0   Insulin  Pen Needle (COMFORT EZ PEN NEEDLES) 32G X 8 MM MISC by Other route.     metFORMIN  (GLUCOPHAGE ) 500 MG tablet Take  500 mg by mouth 2 (two) times daily.     metolazone  (ZAROXOLYN ) 2.5 MG tablet Take 1 tablet (2.5 mg total) by mouth daily as needed (worsening edema, SOB). 30 tablet 0   metoprolol  succinate (TOPROL -XL) 25 MG 24 hr tablet TAKE 1/2 TABLET BY MOUTH EVERY EVENING 45 tablet 3   midodrine  (PROAMATINE ) 2.5 MG tablet Take 1 tablet (2.5 mg total) by mouth 2 (two) times daily with a meal. 60 tablet 5   omeprazole (PRILOSEC) 40 MG capsule Take 40 mg by mouth 2 (two) times daily.     ondansetron  (ZOFRAN -ODT) 4 MG disintegrating tablet Take 1 tablet (4 mg total) by mouth every 8 (eight) hours as needed for nausea or vomiting. 12 tablet 0   potassium chloride  SA (KLOR-CON  M) 20 MEQ tablet Take 2 tablets (40 mEq total) by mouth as directed. By heart failure clinic 60 tablet 0   spironolactone  (ALDACTONE ) 25 MG tablet TAKE 1 TABLET (25 MG TOTAL) BY MOUTH DAILY. (Patient taking differently: Take 12.5 mg by mouth daily.) 90 tablet 3   tamsulosin  (FLOMAX ) 0.4 MG CAPS capsule Take 1 capsule (0.4 mg total) by mouth daily after supper. (Patient taking differently: Take 0.4 mg by mouth in the morning and at bedtime.)     torsemide  (DEMADEX ) 20 MG tablet Take 2 tablets (40 mg total) by mouth daily. (Patient taking differently: Take 20 mg by mouth daily.) 60 tablet 0   No current facility-administered medications for this visit.   Vitals:   02/07/24 1050  BP: 95/75  Pulse: 85  SpO2: 97%   Weight: 187 lb (84.8 kg)   Wt Readings from Last 3 Encounters:  02/07/24 187 lb (84.8 kg)  02/01/24 183 lb 12.8 oz (83.4 kg)  01/25/24 181 lb (82.1 kg)   Lab Results  Component Value Date   CREATININE 2.10 (H) 02/01/2024   CREATININE 2.06 (H) 01/22/2024   CREATININE 1.98 (H) 01/21/2024    PHYSICAL EXAM:  General: Appears to not feel well although in NAD. Able to talk in complete sentences. Cor: Elevated JVD. Regular rhythm, rate.  Lungs: clear Abdomen: soft, nontender, distended. Furoscix  device in place Extremities: trace pitting edema lower legs Neuro:. 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 Will repeat daily for next 2 days. Reminded to not take torsemide  while using furoscix . To resume torsemide  on Sunday 02/10/24 - BMET at upcoming appt - Cardiac MRI 01/27/21: 1. The left ventricle is mildly dilated. Wall thickness is normal. The inferior wall is akinetic. The basal septum is severely hypokinetic. The mid-distal septum is moderately hypokinetic. The basal anterior wall is severely hypokinetic. The mid-distal anterior wall and apex are moderately hypokinetic. The lateral wall is moderately. hypokinetic. 2. The right ventricle is normal in cavity size and wall thickness. There is mild global hypokinesis with mildly reduced RV systolic function. The RVEF is calculated at 43%. - Echo 05/16/21: EF <20% along with mild/moderate LAE and mild/moderate MR.  - Echo 09/16/21: EF 30-35% along with mild MR. - Echo 06/29/22: EF 25% with moderate MR.   - updated echo has been ordered.  - continue metoprolol  succinate 12.5mg  daily - continue spironolactone  12.5mg  daily  -  continue torsemide  20mg  daily with  additional 20mg  if needed for weight gain, swelling/ SOB. Holding while using furoscix .  - allergic to empagliflozin with a yeast infection - cardiac contractility modulator placed 05/24  - had AICD implanted 06/22/21; no shocks have been delivered - BNP 02/01/24 was 941.8 - Should symptoms worsen over the weekend, discussed that he should present to the ER.    2: HTN- - BP 95/75 - continue midodrine  2.5mg  BID to keep SBP >100 - saw PCP 10/25 - BMP 02/01/24 reviewed: sodium 138, potassium 4.0, creatinine 2.10 & GFR 32 - BMET in 4 days   3: DM with CKD- - A1c 01/19/24 was 7.1% - no longer using GLP1 - continue metformin  - saw nephrology Geoffry) 07/25  4: Atrial fibrillation- - saw EP (Riddle) 08/25 - continue metoprolol  succinate 12.5mg  daily - continue apixaban  5mg  BID - no bleeding episodes  -if he has recurrent AF, would favor ablation. 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   5: CAD- - saw cardiology Melva) 05/25 - CABG with a LIMA to LAD, SVG to ramus, and SVG to PDA on 02/16/21 - LHC 02/10/21: 2v CAD, including 100% midRCA, 70% midLAD with 80% focal distal LAD, 70% D1 elevated LVEDP ( )  - continue atorvastatin  80 mg daily.  - LDL 07/16/23 was 52 - no ASA given stable CAD and apixaban  use   Return in 4 days, sooner if needed.   I spent 31 minutes reviewing records, interviewing/ examing patient and managing plan/ orders.    Ellouise DELENA Class, FNP 02/07/24

## 2024-02-07 NOTE — Progress Notes (Signed)
 ReDS Vest / Clip - 02/07/24 1000       ReDS Vest / Clip   Station Marker A    Ruler Value 29    ReDS Value Range High volume overload    ReDS Actual Value 46

## 2024-02-07 NOTE — Progress Notes (Signed)
 Medication Samples have been provided to the patient.  Drug name: Furoscix        Strength: 80mg         Qty: 1 box  LOT: [id number]  Exp.Date: 03/30/2025  Dosing instructions: take 80mg  daily as directed   The patient has been instructed regarding the correct time, dose, and frequency of taking this medication, including desired effects and most common side effects.   Jermaine Kramer 11:29 AM 02/07/2024

## 2024-02-08 ENCOUNTER — Telehealth: Payer: Self-pay | Admitting: Cardiology

## 2024-02-08 NOTE — Telephone Encounter (Signed)
 Called to confirm/remind patient of their appointment at the [facility] on 02/11/24.   Appointment:   [x] Confirmed  [] Left mess   [] No answer/No voice mail  [] VM Full/unable to leave message  [] Phone not in service  Patient reminded to bring all medications and/or complete list.  Confirmed patient has transportation. Gave directions, instructed to utilize valet parking.

## 2024-02-09 ENCOUNTER — Other Ambulatory Visit: Payer: Self-pay

## 2024-02-09 ENCOUNTER — Encounter: Payer: Self-pay | Admitting: Emergency Medicine

## 2024-02-09 ENCOUNTER — Emergency Department

## 2024-02-09 ENCOUNTER — Inpatient Hospital Stay
Admission: EM | Admit: 2024-02-09 | Discharge: 2024-02-13 | DRG: 291 | Disposition: A | Discharge status: Home / Self Care | Attending: Student | Admitting: Student

## 2024-02-09 DIAGNOSIS — I2489 Other forms of acute ischemic heart disease: Secondary | ICD-10-CM | POA: Diagnosis present

## 2024-02-09 DIAGNOSIS — N2581 Secondary hyperparathyroidism of renal origin: Secondary | ICD-10-CM | POA: Diagnosis present

## 2024-02-09 DIAGNOSIS — Z8249 Family history of ischemic heart disease and other diseases of the circulatory system: Secondary | ICD-10-CM

## 2024-02-09 DIAGNOSIS — Z7901 Long term (current) use of anticoagulants: Secondary | ICD-10-CM | POA: Diagnosis not present

## 2024-02-09 DIAGNOSIS — I480 Paroxysmal atrial fibrillation: Secondary | ICD-10-CM | POA: Diagnosis present

## 2024-02-09 DIAGNOSIS — E7800 Pure hypercholesterolemia, unspecified: Secondary | ICD-10-CM | POA: Diagnosis present

## 2024-02-09 DIAGNOSIS — I130 Hypertensive heart and chronic kidney disease with heart failure and stage 1 through stage 4 chronic kidney disease, or unspecified chronic kidney disease: Secondary | ICD-10-CM | POA: Diagnosis present

## 2024-02-09 DIAGNOSIS — E119 Type 2 diabetes mellitus without complications: Secondary | ICD-10-CM

## 2024-02-09 DIAGNOSIS — E1122 Type 2 diabetes mellitus with diabetic chronic kidney disease: Secondary | ICD-10-CM | POA: Diagnosis present

## 2024-02-09 DIAGNOSIS — I5043 Acute on chronic combined systolic (congestive) and diastolic (congestive) heart failure: Secondary | ICD-10-CM | POA: Diagnosis present

## 2024-02-09 DIAGNOSIS — Z95810 Presence of automatic (implantable) cardiac defibrillator: Secondary | ICD-10-CM | POA: Diagnosis not present

## 2024-02-09 DIAGNOSIS — D631 Anemia in chronic kidney disease: Secondary | ICD-10-CM | POA: Diagnosis present

## 2024-02-09 DIAGNOSIS — Z888 Allergy status to other drugs, medicaments and biological substances status: Secondary | ICD-10-CM

## 2024-02-09 DIAGNOSIS — Z79899 Other long term (current) drug therapy: Secondary | ICD-10-CM

## 2024-02-09 DIAGNOSIS — I5023 Acute on chronic systolic (congestive) heart failure: Secondary | ICD-10-CM | POA: Diagnosis not present

## 2024-02-09 DIAGNOSIS — Z7984 Long term (current) use of oral hypoglycemic drugs: Secondary | ICD-10-CM | POA: Diagnosis not present

## 2024-02-09 DIAGNOSIS — E872 Acidosis, unspecified: Secondary | ICD-10-CM | POA: Diagnosis present

## 2024-02-09 DIAGNOSIS — I2510 Atherosclerotic heart disease of native coronary artery without angina pectoris: Secondary | ICD-10-CM | POA: Diagnosis present

## 2024-02-09 DIAGNOSIS — I420 Dilated cardiomyopathy: Secondary | ICD-10-CM | POA: Diagnosis present

## 2024-02-09 DIAGNOSIS — I5042 Chronic combined systolic (congestive) and diastolic (congestive) heart failure: Secondary | ICD-10-CM | POA: Diagnosis present

## 2024-02-09 DIAGNOSIS — Z856 Personal history of leukemia: Secondary | ICD-10-CM

## 2024-02-09 DIAGNOSIS — I255 Ischemic cardiomyopathy: Secondary | ICD-10-CM | POA: Diagnosis present

## 2024-02-09 DIAGNOSIS — Z8673 Personal history of transient ischemic attack (TIA), and cerebral infarction without residual deficits: Secondary | ICD-10-CM

## 2024-02-09 DIAGNOSIS — M7989 Other specified soft tissue disorders: Secondary | ICD-10-CM

## 2024-02-09 DIAGNOSIS — N1832 Chronic kidney disease, stage 3b: Secondary | ICD-10-CM | POA: Diagnosis present

## 2024-02-09 DIAGNOSIS — N400 Enlarged prostate without lower urinary tract symptoms: Secondary | ICD-10-CM | POA: Diagnosis present

## 2024-02-09 DIAGNOSIS — K219 Gastro-esophageal reflux disease without esophagitis: Secondary | ICD-10-CM | POA: Diagnosis present

## 2024-02-09 DIAGNOSIS — I509 Heart failure, unspecified: Secondary | ICD-10-CM | POA: Diagnosis present

## 2024-02-09 DIAGNOSIS — E1169 Type 2 diabetes mellitus with other specified complication: Secondary | ICD-10-CM | POA: Diagnosis present

## 2024-02-09 DIAGNOSIS — Z820 Family history of epilepsy and other diseases of the nervous system: Secondary | ICD-10-CM

## 2024-02-09 DIAGNOSIS — Z881 Allergy status to other antibiotic agents status: Secondary | ICD-10-CM | POA: Diagnosis not present

## 2024-02-09 DIAGNOSIS — E785 Hyperlipidemia, unspecified: Secondary | ICD-10-CM | POA: Diagnosis present

## 2024-02-09 DIAGNOSIS — R7989 Other specified abnormal findings of blood chemistry: Secondary | ICD-10-CM

## 2024-02-09 DIAGNOSIS — Z8269 Family history of other diseases of the musculoskeletal system and connective tissue: Secondary | ICD-10-CM

## 2024-02-09 DIAGNOSIS — N179 Acute kidney failure, unspecified: Secondary | ICD-10-CM | POA: Diagnosis present

## 2024-02-09 DIAGNOSIS — I959 Hypotension, unspecified: Secondary | ICD-10-CM | POA: Diagnosis present

## 2024-02-09 DIAGNOSIS — Z951 Presence of aortocoronary bypass graft: Secondary | ICD-10-CM | POA: Diagnosis not present

## 2024-02-09 LAB — TROPONIN I (HIGH SENSITIVITY)
Troponin I (High Sensitivity): 27 ng/L — ABNORMAL HIGH (ref <18)
Troponin I (High Sensitivity): 21 ng/L — ABNORMAL HIGH (ref <18)

## 2024-02-09 LAB — CBC
HCT: 38.3 % — ABNORMAL LOW (ref 39.0–52.0)
Hemoglobin: 11.8 g/dL — ABNORMAL LOW (ref 13.0–17.0)
MCH: 26.1 pg (ref 26.0–34.0)
MCHC: 30.8 g/dL (ref 30.0–36.0)
MCV: 84.7 fL (ref 80.0–100.0)
Platelets: 155 K/uL (ref 150–400)
RBC: 4.52 MIL/uL (ref 4.22–5.81)
RDW: 14.4 % (ref 11.5–15.5)
WBC: 5 K/uL (ref 4.0–10.5)
nRBC: 0 % (ref 0.0–0.2)

## 2024-02-09 LAB — BASIC METABOLIC PANEL WITH GFR
Anion gap: 10 (ref 5–15)
BUN: 31 mg/dL — ABNORMAL HIGH (ref 8–23)
CO2: 26 mmol/L (ref 22–32)
Calcium: 9 mg/dL (ref 8.9–10.3)
Chloride: 102 mmol/L (ref 98–111)
Creatinine, Ser: 2.33 mg/dL — ABNORMAL HIGH (ref 0.61–1.24)
GFR, Estimated: 28 mL/min — ABNORMAL LOW (ref >60)
Glucose, Bld: 241 mg/dL — ABNORMAL HIGH (ref 70–99)
Potassium: 4.3 mmol/L (ref 3.5–5.1)
Sodium: 138 mmol/L (ref 135–145)

## 2024-02-09 LAB — D-DIMER, QUANTITATIVE: D-Dimer, Quant: 4.93 ug{FEU}/mL — ABNORMAL HIGH (ref 0.00–0.50)

## 2024-02-09 LAB — BRAIN NATRIURETIC PEPTIDE: B Natriuretic Peptide: 1871.3 pg/mL — ABNORMAL HIGH (ref 0.0–100.0)

## 2024-02-09 MED ORDER — METOPROLOL SUCCINATE ER 25 MG PO TB24
12.5000 mg | ORAL_TABLET | Freq: Every evening | ORAL | Status: DC
  Administered 2024-02-10 – 2024-02-12 (×3): 12.5 mg via ORAL
  Filled 2024-02-09 (×3): qty 1

## 2024-02-09 MED ORDER — OXYCODONE HCL 5 MG PO TABS: 5.0000 mg | ORAL_TABLET | ORAL | Status: DC | PRN

## 2024-02-09 MED ORDER — TAMSULOSIN HCL 0.4 MG PO CAPS
0.4000 mg | ORAL_CAPSULE | Freq: Every day | ORAL | Status: DC
  Administered 2024-02-10 – 2024-02-13 (×4): 0.4 mg via ORAL
  Filled 2024-02-09 (×5): qty 1

## 2024-02-09 MED ORDER — ACETAMINOPHEN 325 MG PO TABS
650.0000 mg | ORAL_TABLET | Freq: Four times a day (QID) | ORAL | Status: DC | PRN
  Administered 2024-02-12: 650 mg via ORAL
  Filled 2024-02-09: qty 2

## 2024-02-09 MED ORDER — ACETAMINOPHEN 650 MG RE SUPP: 650.0000 mg | Freq: Four times a day (QID) | RECTAL | Status: DC | PRN

## 2024-02-09 MED ORDER — ONDANSETRON HCL 4 MG/2ML IJ SOLN
4.0000 mg | Freq: Four times a day (QID) | INTRAMUSCULAR | Status: DC | PRN
  Administered 2024-02-12: 4 mg via INTRAVENOUS
  Filled 2024-02-09: qty 2

## 2024-02-09 MED ORDER — FUROSEMIDE 10 MG/ML IJ SOLN
40.0000 mg | Freq: Two times a day (BID) | INTRAMUSCULAR | Status: DC
  Administered 2024-02-09: 40 mg via INTRAVENOUS
  Filled 2024-02-09: qty 4

## 2024-02-09 MED ORDER — ONDANSETRON HCL 4 MG PO TABS
4.0000 mg | ORAL_TABLET | Freq: Four times a day (QID) | ORAL | Status: DC | PRN
  Administered 2024-02-10 – 2024-02-13 (×2): 4 mg via ORAL
  Filled 2024-02-09 (×2): qty 1

## 2024-02-09 MED ORDER — MIDODRINE HCL 5 MG PO TABS
2.5000 mg | ORAL_TABLET | Freq: Two times a day (BID) | ORAL | Status: DC
  Administered 2024-02-09 – 2024-02-13 (×8): 2.5 mg via ORAL
  Filled 2024-02-09 (×8): qty 1

## 2024-02-09 MED ORDER — FUROSEMIDE 10 MG/ML IJ SOLN
100.0000 mg | Freq: Once | INTRAVENOUS | Status: DC
  Filled 2024-02-09: qty 10

## 2024-02-09 MED ORDER — APIXABAN 5 MG PO TABS
5.0000 mg | ORAL_TABLET | Freq: Two times a day (BID) | ORAL | Status: DC
  Administered 2024-02-09 – 2024-02-13 (×8): 5 mg via ORAL
  Filled 2024-02-09 (×8): qty 1

## 2024-02-09 MED ORDER — ATORVASTATIN CALCIUM 80 MG PO TABS
80.0000 mg | ORAL_TABLET | Freq: Every day | ORAL | Status: DC
  Administered 2024-02-10 – 2024-02-13 (×4): 80 mg via ORAL
  Filled 2024-02-09 (×4): qty 1

## 2024-02-09 MED ORDER — POLYETHYLENE GLYCOL 3350 17 G PO PACK: 17.0000 g | PACK | Freq: Every day | ORAL | Status: DC | PRN

## 2024-02-09 MED ORDER — CALCITRIOL 0.25 MCG PO CAPS
0.2500 ug | ORAL_CAPSULE | Freq: Every day | ORAL | Status: DC
  Administered 2024-02-09 – 2024-02-13 (×5): 0.25 ug via ORAL
  Filled 2024-02-09 (×5): qty 1

## 2024-02-09 NOTE — ED Provider Notes (Signed)
 [facility] Provider Note    Event Date/Time   First MD Initiated Contact with Patient 02/09/24 [phone]     (approximate)   History   Shortness of Breath   HPI  Jermaine Kramer is a 73 year old male with history of leukemia, diabetes, CAD, CHF presenting to the emergency department for evaluation of shortness of breath.  Patient with ongoing shortness of breath for few months, but worse over the last week or so.  Saw the heart failure team and has been taking his Lasix  injection the past few days but has not noticed any improvement in his fluid overload.  Does report abdominal distention as well as wheezing when attempting to sleep.  No history of asthma or COPD.  I reviewed his heart failure visit from 02/07/2024.  At that time patient was noted to to up 4 pounds from visit 6 days prior. Placed on Furoscix  with plan to resume torsemide  on Sunday.      Physical Exam   Triage Vital Signs: ED Triage Vitals  Encounter Vitals Group     BP 02/09/24 0911 100/71     Girls Systolic BP Percentile --      Girls Diastolic BP Percentile --      Boys Systolic BP Percentile --      Boys Diastolic BP Percentile --      Pulse Rate 02/09/24 0911 93     Resp 02/09/24 0911 (!) 26     Temp 02/09/24 0911 97.7 F (36.5 C)     Temp Source 02/09/24 0911 Oral     SpO2 02/09/24 0911 95 %     Weight 02/09/24 0908 187 lb (84.8 kg)     Height 02/09/24 0908 5' 5 (1.651 m)     Head Circumference --      Peak Flow --      Pain Score 02/09/24 0906 5     Pain Loc --      Pain Education --      Exclude from Growth Chart --     Most recent vital signs: Vitals:   02/09/24 1030 02/09/24 1100  BP: 95/70 102/70  Pulse: 81 86  Resp: 20 16  Temp:    SpO2: 93% 100%     General: Awake, interactive  CV:  Good peripheral perfusion Resp:  Tachypneic with mildly labored respirations, bibasilar crackles with scattered wheeze noted Abd:  Nondistended.  Neuro:  Symmetric facial  movement, fluid speech Other:   Bilateral lower extremity edema, does appear to be slightly worse on the right, intact distal pulses   ED Results / Procedures / Treatments   Labs (all labs ordered are listed, but only abnormal results are displayed) Labs Reviewed  BASIC METABOLIC PANEL WITH GFR - Abnormal; Notable for the following components:      Result Value   Glucose, Bld 241 (*)    BUN 31 (*)    Creatinine, Ser 2.33 (*)    GFR, Estimated 28 (*)    All other components within normal limits  CBC - Abnormal; Notable for the following components:   Hemoglobin 11.8 (*)    HCT 38.3 (*)    All other components within normal limits  BRAIN NATRIURETIC PEPTIDE - Abnormal; Notable for the following components:   B Natriuretic Peptide 1,871.3 (*)    All other components within normal limits  D-DIMER, QUANTITATIVE - Abnormal; Notable for the following components:   D-Dimer, Quant 4.93 (*)    All other components  within normal limits  TROPONIN I (HIGH SENSITIVITY) - Abnormal; Notable for the following components:   Troponin I (High Sensitivity) 27 (*)    All other components within normal limits  TROPONIN I (HIGH SENSITIVITY)     EKG EKG independently reviewed and interpreted by myself demonstrates:  EKG demonstrate sinus rhythm at a rate of 91, PR 159, QRS 165, QTc 512, nonspecific ST changes noted, right bundle branch block, no STEMI  RADIOLOGY Imaging independently reviewed and interpreted by myself demonstrates:  CXR with mild pulmonary edema  Formal Radiology Read:  DG Chest Portable 1 View Result Date: 02/09/2024 EXAM: 1 VIEW(S) XRAY OF THE CHEST 02/09/2024 09:24:00 AM COMPARISON: 01/19/2024 CLINICAL HISTORY: sob. Pt c/o SOB for the past couple of months, that has gotten worse the past week or so. States he started taken 80mg  of Lasix  on Thursday once a day and it has not helped. Report some abd distention. Also c/o of tightness in his chest and some cough and ; congestion.  Pt has a hx of CHF. Pt is A\T\Ox4, pt labored and tachypneic. FINDINGS: LINES, TUBES AND DEVICES: Stable left AICD with 2 leads. Stable right AICD with 2 leads. LUNGS AND PLEURA: Mild pulmonary edema. Bibasilar atelectasis. Small bilateral pleural effusions. No pneumothorax. HEART AND MEDIASTINUM: Cardiomegaly. CABG markers noted. BONES AND SOFT TISSUES: Sternotomy wires noted. No acute osseous abnormality. IMPRESSION: 1. Mild pulmonary edema with bibasilar atelectasis 2. Small bilateral pleural effusions Electronically signed by: Waddell Calk MD 02/09/2024 09:53 AM EDT RP Workstation: [id number]    PROCEDURES:  Critical Care performed: Yes, see critical care procedure note(s)  CRITICAL CARE Performed by: Nilsa Dade   Total critical care time: 31 minutes  Critical care time was exclusive of separately billable procedures and treating other patients.  Critical care was necessary to treat or prevent imminent or life-threatening deterioration.  Critical care was time spent personally by me on the following activities: development of treatment plan with patient and/or surrogate as well as nursing, discussions with consultants, evaluation of patient's response to treatment, examination of patient, obtaining history from patient or surrogate, ordering and performing treatments and interventions, ordering and review of laboratory studies, ordering and review of radiographic studies, pulse oximetry and re-evaluation of patient's condition.   Procedures   MEDICATIONS ORDERED IN ED: Medications - No data to display   IMPRESSION / MDM / ASSESSMENT AND PLAN / ED COURSE  I reviewed the triage vital signs and the nursing notes.  Differential diagnosis includes, but is not limited to, CHF exacerbation, pneumonia, pneumothorax, ACS, pulmonary embolism  Patient's presentation is most consistent with acute presentation with potential threat to life or bodily function.  73 year old male presenting to  the emergency department for evaluation of shortness of breath.  Tachypneic but otherwise stable vitals on presentation.  Clinically does appear to have ongoing volume overload.  Will obtain labs, x-Lezlee Gills to further evaluate.  With asymmetric leg swelling will obtain D-dimer as well.  CBC with normal white blood cell count, mild anemia not significantly changed from prior.  BMP with stable to slightly worsened renal function at a creatinine of 2.33.  BNP elevated at 1800, increased from recent priors.  D-dimer also significantly elevated at 4.93 as below.  Troponin slightly elevated at 27, consistent with recent priors.  Clinical Course as of 02/09/24 1119  Sat Feb 09, 2024  1029 D-dimer, quantitative(!) D-dimer significantly elevated.  CTA initially planned, but GFR under 30.  In the setting of this I have ordered ultrasounds  of the bilateral lower extremities as well as a perfusion study.  Do think patient is appropriate for admission for further evaluation of what appears to be at least a CHF exacerbation, but moderate concern for possible VTE.  Does have soft blood pressure here, will defer on diuretics for now.  Will discuss with hospitalist team. [NR]  1110 Case discussed with Dr. Paudel.  He will evaluate for anticipated admission. [NR]    Clinical Course User Index [NR] Levander Slate, MD      FINAL CLINICAL IMPRESSION(S) / ED DIAGNOSES   Final diagnoses:  Acute on chronic congestive heart failure, unspecified heart failure type (HCC)  Leg swelling  Positive D dimer     Rx / DC Orders   ED Discharge Orders     None        Note:  This document was prepared using Dragon voice recognition software and may include unintentional dictation errors.   Levander Slate, MD 02/09/24 [phone]

## 2024-02-09 NOTE — H&P (Addendum)
 History and Physical    DUILIO HERITAGE FMW:[id number] DOB: 09-Sep-1950 DOA: 02/09/2024  DOS: the patient was seen and examined on 02/09/2024  PCP: Perri Constance Sor, PA-C   Patient coming from: Home  I have personally briefly reviewed patient's old medical records in [facility]  Chief Complaint: Shortness of breath for 2 months  HPI: Jermaine Kramer is a pleasant 73 y.o. male with medical history significant for CHF, A-fib on Eliquis , leukemia, CAD, stroke, HTN, HLD, GERD, diabetes who came into ED complaining of shortness of breath for the last 2 months getting worse for the last few days.  Patient stated that he started taking Lasix  80 mg p.o. on Thursday but it did not help.  He had a similar incident few days ago and he was hospitalized and he lost maybe 10 pounds of weight when he was admitted to the hospital per his wife.  After he was discharged he gained again 4 or 5 pounds.  Patient also reported abdominal distention and during last admission he was advised that there is no fluid in the abdominal cavity.  He also complains some chest tightness and some cough and congestion.  He is alert awake and oriented.  He was able to provide history.  Also his wife was at bedside.  She advised that he used to have a very high blood pressure but recently he is blood pressure was low and he was given midodrine  and given him Lasix .  Patient denies any fever, palpitation, dysuria, hematuria.  ED Course: Upon arrival to the ED, patient is found to be bilateral lower leg swelling, tachypneic at 26 soft blood pressure at 102/70, blood glucose 241, BUN 31, creatinine 2.3, hemoglobin 11.8, hematocrit 38.3, BNP 1871, D-dimer 4.93, troponin 27.  EKG showed sinus rhythm at 91 bpm, no ST elevation, chest x-ray showed mild pulmonary edema with bibasilar atelectasis.  Small bilateral pleural effusion.  Patient was being planned for Lasix  but due to low blood pressure that was held.  Bilateral lower extremity  ultrasound showed no DVT.  Hospital service was consulted for evaluation for admission.  Review of Systems:  ROS  All other systems negative except as noted in the HPI.  Past Medical History:  Diagnosis Date   Arrhythmia    atrial fibrillation   CHF (congestive heart failure) (HCC)    Coronary artery disease    Diabetes mellitus without complication (HCC)    GERD (gastroesophageal reflux disease)    Hypercholesteremia    Hypertension    Sleep apnea    Stroke [facility])    2015 or longer ago    Past Surgical History:  Procedure Laterality Date   CATARACT EXTRACTION Bilateral 04/06/2022   CCM GENERATOR AND A/V LEAD INSERTION N/A 09/04/2022   Procedure: CCM GENERATOR AND A/V LEAD INSERTION;  Surgeon: Cindie Ole DASEN, MD;  Location: [facility] INVASIVE CV LAB;  Service: Cardiovascular;  Laterality: N/A;   CHOLECYSTECTOMY     COLONOSCOPY WITH PROPOFOL  N/A 04/14/2019   Procedure: COLONOSCOPY WITH PROPOFOL ;  Surgeon: Toledo, Ladell POUR, MD;  Location: [facility] ENDOSCOPY;  Service: Gastroenterology;  Laterality: N/A;   CORONARY ARTERY BYPASS GRAFT  02/16/2021   ICD IMPLANT N/A 06/22/2021   Procedure: ICD IMPLANT;  Surgeon: Cindie Ole DASEN, MD;  Location: [facility] INVASIVE CV LAB;  Service: Cardiovascular;  Laterality: N/A;   KNEE ARTHROSCOPY     RENAL CYST EXCISION       reports that he has never smoked. He has never used smokeless tobacco.  He reports that he does not currently use alcohol. He reports that he does not use drugs.  Allergies  Allergen Reactions   Doxycycline Anaphylaxis    Patient does not recall having this reaction.     Heparin Other (See Comments)    heparin-induced thrombocytopenia    Empagliflozin Itching    Groin infection Caused a groin infection.      Family History  Problem Relation Age of Onset   Parkinson's disease Mother    Lupus Mother    Heart disease Father     Prior to Admission medications   Medication Sig Start Date End Date Taking? Authorizing  Provider  apixaban  (ELIQUIS ) 5 MG TABS tablet Take 1 tablet (5 mg total) by mouth 2 (two) times daily. 11/28/21   Donette Ellouise LABOR, FNP  atorvastatin  (LIPITOR ) 80 MG tablet Take 1 tablet (80 mg total) by mouth daily. 01/10/22   Donette Ellouise LABOR, FNP  calcitRIOL (ROCALTROL) 0.25 MCG capsule Take 0.25 mcg by mouth. 12/17/23 12/16/24  [provider]  Continuous Glucose Sensor (FREESTYLE LIBRE 2 SENSOR) MISC by Does not apply route.    [provider]  Furosemide  (FUROSCIX ) 80 MG/10ML CTKT Take 80mg  daily as directed. 01/25/24   Donette Ellouise LABOR, FNP  Insulin  Pen Needle (COMFORT EZ PEN NEEDLES) 32G X 8 MM MISC by Other route. 07/16/23 07/15/24  [provider]  metFORMIN  (GLUCOPHAGE ) 500 MG tablet Take 500 mg by mouth 2 (two) times daily.    [provider]  metolazone  (ZAROXOLYN ) 2.5 MG tablet Take 1 tablet (2.5 mg total) by mouth daily as needed (worsening edema, SOB). 01/22/24   Fausto Burnard LABOR, DO  metoprolol  succinate (TOPROL -XL) 25 MG 24 hr tablet TAKE 1/2 TABLET BY MOUTH EVERY EVENING 06/25/23   Donette Ellouise LABOR, FNP  midodrine  (PROAMATINE ) 2.5 MG tablet Take 1 tablet (2.5 mg total) by mouth 2 (two) times daily with a meal. 01/25/24   Donette Ellouise LABOR, FNP  omeprazole (PRILOSEC) 40 MG capsule Take 40 mg by mouth 2 (two) times daily. 03/11/21   [provider]  ondansetron  (ZOFRAN -ODT) 4 MG disintegrating tablet Take 1 tablet (4 mg total) by mouth every 8 (eight) hours as needed for nausea or vomiting. 04/16/23   Willo Dunnings, MD  potassium chloride  SA (KLOR-CON  M) 20 MEQ tablet Take 2 tablets (40 mEq total) by mouth as directed. By heart failure clinic 01/25/24   Donette Ellouise A, FNP  spironolactone  (ALDACTONE ) 25 MG tablet TAKE 1 TABLET (25 MG TOTAL) BY MOUTH DAILY. Patient taking differently: Take 12.5 mg by mouth daily. 12/24/23   Riddle, Suzann, NP  tamsulosin  (FLOMAX ) 0.4 MG CAPS capsule Take 1 capsule (0.4 mg total) by mouth daily after supper. Patient  taking differently: Take 0.4 mg by mouth in the morning and at bedtime. 01/22/24   Fausto Burnard LABOR, DO  torsemide  (DEMADEX ) 20 MG tablet Take 2 tablets (40 mg total) by mouth daily. Patient taking differently: Take 20 mg by mouth daily. 01/23/24   Fausto Burnard LABOR, DO    Physical Exam: Vitals:   02/09/24 1145 02/09/24 1230 02/09/24 1330 02/09/24 1336  BP:  98/72 93/81   Pulse: 87 89 92   Resp: 18  17   Temp:    97.9 F (36.6 C)  TempSrc:    Oral  SpO2: 93% 94% 97%   Weight:      Height:        Physical Exam   Constitutional: Alert, awake, calm, comfortable HEENT: Neck  supple Respiratory: Bilateral decreased air entry at the bases, bilateral basal rales no wheezes no rhonchi Cardiovascular: Regular rate and rhythm, no murmurs / rubs / gallops.  2+ pedal pulses. No carotid bruits.  Bilateral 2+ pitting edema Abdomen: Soft, no tenderness, Bowel sounds positive.  Distended abdomen, no tenderness, no ascites Musculoskeletal: no clubbing / cyanosis. Good ROM, no contractures. Normal muscle tone.  Skin: no rashes, lesions, ulcers. Neurologic: CN 2-12 grossly intact. Sensation intact, No focal deficit identified Psychiatric: Alert and oriented x 3. Normal mood.    Labs on Admission: I have personally reviewed following labs and imaging studies  CBC: Recent Labs  Lab 02/09/24 0921  WBC 5.0  HGB 11.8*  HCT 38.3*  MCV 84.7  PLT 155   Basic Metabolic Panel: Recent Labs  Lab 02/09/24 0921  NA 138  K 4.3  CL 102  CO2 26  GLUCOSE 241*  BUN 31*  CREATININE 2.33*  CALCIUM  9.0   GFR: Estimated Creatinine Clearance: 27.4 mL/min (A) (by C-G formula based on SCr of 2.33 mg/dL (H)). Liver Function Tests: No results for input(s): AST, ALT, ALKPHOS, BILITOT, PROT, ALBUMIN in the last 168 hours. No results for input(s): LIPASE, AMYLASE in the last 168 hours. No results for input(s): AMMONIA in the last 168 hours. Coagulation Profile: No results for input(s):  INR, PROTIME in the last 168 hours. Cardiac Enzymes: Recent Labs  Lab 02/09/24 0921 02/09/24 1154  TROPONINIHS 27* 21*   BNP (last 3 results) Recent Labs    01/19/24 0721 02/01/24 1118 02/09/24 0921  BNP 1,161.0* 941.8* 1,871.3*   HbA1C: No results for input(s): HGBA1C in the last 72 hours. CBG: No results for input(s): GLUCAP in the last 168 hours. Lipid Profile: No results for input(s): CHOL, HDL, LDLCALC, TRIG, CHOLHDL, LDLDIRECT in the last 72 hours. Thyroid Function Tests: No results for input(s): TSH, T4TOTAL, FREET4, T3FREE, THYROIDAB in the last 72 hours. Anemia Panel: No results for input(s): VITAMINB12, FOLATE, FERRITIN, TIBC, IRON, RETICCTPCT in the last 72 hours. Urine analysis:    Component Value Date/Time   COLORURINE YELLOW (A) 04/05/2023 1925   APPEARANCEUR CLEAR (A) 04/05/2023 1925   APPEARANCEUR CLEAR 01/15/2013 1659   LABSPEC 1.011 04/05/2023 1925   LABSPEC 1.005 01/15/2013 1659   PHURINE 5.0 04/05/2023 1925   GLUCOSEU NEGATIVE 04/05/2023 1925   GLUCOSEU NEGATIVE 01/15/2013 1659   HGBUR SMALL (A) 04/05/2023 1925   BILIRUBINUR NEGATIVE 04/05/2023 1925   BILIRUBINUR NEGATIVE 01/15/2013 1659   KETONESUR NEGATIVE 04/05/2023 1925   PROTEINUR NEGATIVE 04/05/2023 1925   NITRITE NEGATIVE 04/05/2023 1925   LEUKOCYTESUR TRACE (A) 04/05/2023 1925   LEUKOCYTESUR 1+ 01/15/2013 1659    Radiological Exams on Admission: I have personally reviewed images US  Venous Img Lower Bilateral Result Date: 02/09/2024 CLINICAL DATA:  Leg swelling EXAM: BILATERAL LOWER EXTREMITY VENOUS DOPPLER ULTRASOUND TECHNIQUE: Gray-scale sonography with compression, as well as color and duplex ultrasound, were performed to evaluate the deep venous system(s) from the level of the common femoral vein through the popliteal and proximal calf veins. COMPARISON:  None Available. FINDINGS: VENOUS Normal compressibility of the common femoral, superficial  femoral, and popliteal veins, as well as the visualized calf veins. Visualized portions of profunda femoral vein and great saphenous vein unremarkable. No filling defects to suggest DVT on grayscale or color Doppler imaging. Doppler waveforms show normal direction of venous flow, normal respiratory plasticity and response to augmentation. OTHER Subcutaneous soft tissue edema of bilateral lower legs. Heterogeneously hypoechoic, ovoid structures in the proximal  medial thighs without appreciable internal vascularity on color Doppler evaluation measure 4.3 x 2.6 x 1.4 cm on the right and 4.0 x 1.8 x 1.3 cm on the left. Limitations: none IMPRESSION: 1. No evidence of deep venous thrombosis in the bilateral lower extremities. 2. Bilateral lower leg soft tissue edema. 3. Heterogeneously hypoechoic, ovoid structures in the proximal medial thighs without appreciable internal vascularity on color Doppler evaluation measure 4.3 x 2.6 x 1.4 cm on the right and 4.0 x 1.8 x 1.3 cm on the left. These are nonspecific and may represent hematomas, prominent musculature, or other soft tissue mass. Recommend further evaluation with CT or MRI of the thighs. Electronically Signed   By: Limin  Xu M.D.   On: 02/09/2024 12:44   DG Chest Portable 1 View Result Date: 02/09/2024 EXAM: 1 VIEW(S) XRAY OF THE CHEST 02/09/2024 09:24:00 AM COMPARISON: 01/19/2024 CLINICAL HISTORY: sob. Pt c/o SOB for the past couple of months, that has gotten worse the past week or so. States he started taken 80mg  of Lasix  on Thursday once a day and it has not helped. Report some abd distention. Also c/o of tightness in his chest and some cough and ; congestion. Pt has a hx of CHF. Pt is A\T\Ox4, pt labored and tachypneic. FINDINGS: LINES, TUBES AND DEVICES: Stable left AICD with 2 leads. Stable right AICD with 2 leads. LUNGS AND PLEURA: Mild pulmonary edema. Bibasilar atelectasis. Small bilateral pleural effusions. No pneumothorax. HEART AND MEDIASTINUM:  Cardiomegaly. CABG markers noted. BONES AND SOFT TISSUES: Sternotomy wires noted. No acute osseous abnormality. IMPRESSION: 1. Mild pulmonary edema with bibasilar atelectasis 2. Small bilateral pleural effusions Electronically signed by: Waddell Calk MD 02/09/2024 09:53 AM EDT RP Workstation: [id number]    EKG: My personal interpretation of EKG shows: Sinus rhythm at 91 bpm, no ST elevation    Assessment/Plan Principal Problem:   CHF (congestive heart failure) (HCC) Active Problems:   Hypotension   DM (diabetes mellitus), type 2 with coma (HCC)   GERD (gastroesophageal reflux disease)   Dilated cardiomyopathy (HCC)   CAD s/p CABG x 3   Hyperlipidemia LDL goal <70   Chronic combined systolic and diastolic congestive heart failure (HCC)    Assessment and Plan: 73 year old male double/PMH of CAD s/p CABG, HFrEF/cardiomyopathy, DM, HLD, hypotension on midodrine  who came into ED complaining of shortness of breath.  1.  Acute exacerbation of congestive heart failure - Patient is short of breath but no hypoxemic. - He will be admitted to hospital as inpatient for acute exacerbation of congestive heart failure failed outpatient treatment with 80 mg of Lasix  - He will be placed on Lasix  40 mg twice daily with holding parameters of systolic blood pressure less than 100 - Input output charting - Heart failure protocol -There was concern for possible persistent shortness of breath on Lasix  to rule out PE.  Patient has AKI and suspicions for PE is low as the ultrasound for DVTs are negative. - Cardiology evaluation  2.  AKI on CKD 4 - Continue monitoring kidney function - He needs diuresis sometimes it may improve the kidney function - If his kidney functions are worse then we may need to use inotropic agent. - Will defer to cardiology - If kidney function were to go bad then may need nephrology for evaluation.  3.  Diabetes - Will place him on insulin  sliding scale - Hold oral  hypoglycemic occasion  4.  Atrial fibrillation on Eliquis  - Heart rate has been stable - Continue Eliquis  -  His medication has not been updated but it looks like he needs metoprolol  which has been continued at 12.5 daily. - He has a midodrine  for hypotension episodes.  Which will be continued  5.  Hypotension - Continue midodrine   6.  GERD - He takes omeprazole - Will give him Protonix  while he is in the hospital.   7.  BPH - Continue Flomax .  8.  Deconditioning - Give him PT/OT    DVT prophylaxis: Eliquis  Code Status: Full Code Family Communication: Wife at bedside  Disposition Plan: Home Consults called: Cardiology Admission status: Inpatient, Telemetry bed   Nena Rebel, MD [facility] 02/09/2024, 4:34 PM

## 2024-02-09 NOTE — ED Triage Notes (Addendum)
 Pt via POV from home. Pt c/o SOB for the past couple of months, that has gotten worse the past week or so. States he started taken 80mg  of Lasix  on Thursday once a day and it has not helped. Report some abd distention. Also c/o of tightness in his chest and some cough and congestion. Pt has a hx of CHF. Pt is A&Ox4, pt labored and tachypneic.

## 2024-02-09 NOTE — ED Notes (Signed)
 Pt has had some intermittent drops in SPO2 while he sleeps, but quickly resolves. Pt states he does have sleep apnea but refuses to wear oxygen.

## 2024-02-09 NOTE — ED Notes (Signed)
 EDP informed of BP of 95/70. Lasix  dc'd at this time. Pt states he does take midodrine  for his low BP, reports taking a dose this morning.

## 2024-02-10 ENCOUNTER — Encounter: Payer: Self-pay | Admitting: Hospitalist

## 2024-02-10 DIAGNOSIS — I5023 Acute on chronic systolic (congestive) heart failure: Secondary | ICD-10-CM | POA: Diagnosis not present

## 2024-02-10 LAB — COMPREHENSIVE METABOLIC PANEL WITH GFR
ALT: 9 U/L (ref 0–44)
AST: 13 U/L — ABNORMAL LOW (ref 15–41)
Albumin: 3.3 g/dL — ABNORMAL LOW (ref 3.5–5.0)
Alkaline Phosphatase: 56 U/L (ref 38–126)
Anion gap: 8 (ref 5–15)
BUN: 29 mg/dL — ABNORMAL HIGH (ref 8–23)
CO2: 25 mmol/L (ref 22–32)
Calcium: 8.8 mg/dL — ABNORMAL LOW (ref 8.9–10.3)
Chloride: 105 mmol/L (ref 98–111)
Creatinine, Ser: 2.05 mg/dL — ABNORMAL HIGH (ref 0.61–1.24)
GFR, Estimated: 33 mL/min — ABNORMAL LOW (ref >60)
Glucose, Bld: 143 mg/dL — ABNORMAL HIGH (ref 70–99)
Potassium: 3.9 mmol/L (ref 3.5–5.1)
Sodium: 138 mmol/L (ref 135–145)
Total Bilirubin: 0.8 mg/dL (ref 0.0–1.2)
Total Protein: 5.9 g/dL — ABNORMAL LOW (ref 6.5–8.1)

## 2024-02-10 LAB — CBC
HCT: 34.2 % — ABNORMAL LOW (ref 39.0–52.0)
Hemoglobin: 11 g/dL — ABNORMAL LOW (ref 13.0–17.0)
MCH: 26.5 pg (ref 26.0–34.0)
MCHC: 32.2 g/dL (ref 30.0–36.0)
MCV: 82.4 fL (ref 80.0–100.0)
Platelets: 146 K/uL — ABNORMAL LOW (ref 150–400)
RBC: 4.15 MIL/uL — ABNORMAL LOW (ref 4.22–5.81)
RDW: 14.5 % (ref 11.5–15.5)
WBC: 5.1 K/uL (ref 4.0–10.5)
nRBC: 0 % (ref 0.0–0.2)

## 2024-02-10 LAB — MAGNESIUM: Magnesium: 1.5 mg/dL — ABNORMAL LOW (ref 1.7–2.4)

## 2024-02-10 LAB — PROTIME-INR
INR: 1.6 — ABNORMAL HIGH (ref 0.8–1.2)
Prothrombin Time: 20.3 s — ABNORMAL HIGH (ref 11.4–15.2)

## 2024-02-10 LAB — GLUCOSE, CAPILLARY
Glucose-Capillary: 230 mg/dL — ABNORMAL HIGH (ref 70–99)
Glucose-Capillary: 192 mg/dL — ABNORMAL HIGH (ref 70–99)
Glucose-Capillary: 160 mg/dL — ABNORMAL HIGH (ref 70–99)
Glucose-Capillary: 168 mg/dL — ABNORMAL HIGH (ref 70–99)

## 2024-02-10 MED ORDER — FUROSEMIDE 10 MG/ML IJ SOLN
5.0000 mg/h | INTRAVENOUS | Status: DC
  Administered 2024-02-10 – 2024-02-11 (×2): 5 mg/h via INTRAVENOUS
  Filled 2024-02-10 (×2): qty 20

## 2024-02-10 MED ORDER — MELATONIN 5 MG PO TABS
5.0000 mg | ORAL_TABLET | Freq: Every evening | ORAL | Status: DC | PRN
  Administered 2024-02-12: 5 mg via ORAL
  Filled 2024-02-10: qty 1

## 2024-02-10 MED ORDER — FUROSEMIDE 10 MG/ML IJ SOLN: 40.0000 mg | Freq: Two times a day (BID) | INTRAMUSCULAR | Status: DC

## 2024-02-10 MED ORDER — HYDRALAZINE HCL 20 MG/ML IJ SOLN: 5.0000 mg | Freq: Four times a day (QID) | INTRAMUSCULAR | Status: DC | PRN

## 2024-02-10 MED ORDER — FUROSEMIDE 10 MG/ML IJ SOLN
20.0000 mg | Freq: Once | INTRAMUSCULAR | Status: AC
  Administered 2024-02-10: 20 mg via INTRAVENOUS
  Filled 2024-02-10: qty 2

## 2024-02-10 MED ORDER — INSULIN ASPART 100 UNIT/ML IJ SOLN: 0.0000 [IU] | Freq: Every day | INTRAMUSCULAR | Status: DC

## 2024-02-10 MED ORDER — FUROSEMIDE 10 MG/ML IJ SOLN: 40.0000 mg | Freq: Once | INTRAMUSCULAR | Status: DC

## 2024-02-10 MED ORDER — IPRATROPIUM-ALBUTEROL 0.5-2.5 (3) MG/3ML IN SOLN
3.0000 mL | Freq: Four times a day (QID) | RESPIRATORY_TRACT | Status: DC | PRN
  Administered 2024-02-11 – 2024-02-12 (×2): 3 mL via RESPIRATORY_TRACT
  Filled 2024-02-10 (×2): qty 3

## 2024-02-10 MED ORDER — SPIRONOLACTONE 12.5 MG HALF TABLET
12.5000 mg | ORAL_TABLET | Freq: Every day | ORAL | Status: DC
  Administered 2024-02-10 – 2024-02-13 (×3): 12.5 mg via ORAL
  Filled 2024-02-10 (×4): qty 1

## 2024-02-10 MED ORDER — IPRATROPIUM-ALBUTEROL 0.5-2.5 (3) MG/3ML IN SOLN
3.0000 mL | Freq: Four times a day (QID) | RESPIRATORY_TRACT | Status: AC
  Administered 2024-02-10 – 2024-02-11 (×2): 3 mL via RESPIRATORY_TRACT
  Filled 2024-02-10 (×3): qty 3

## 2024-02-10 MED ORDER — MAGNESIUM SULFATE IN D5W 1-5 GM/100ML-% IV SOLN
1.0000 g | Freq: Once | INTRAVENOUS | Status: AC
  Administered 2024-02-10: 1 g via INTRAVENOUS
  Filled 2024-02-10: qty 100

## 2024-02-10 MED ORDER — PANTOPRAZOLE SODIUM 40 MG PO TBEC
80.0000 mg | DELAYED_RELEASE_TABLET | Freq: Two times a day (BID) | ORAL | Status: DC
  Administered 2024-02-10 – 2024-02-13 (×7): 80 mg via ORAL
  Filled 2024-02-10 (×7): qty 2

## 2024-02-10 MED ORDER — INSULIN ASPART 100 UNIT/ML IJ SOLN
0.0000 [IU] | Freq: Three times a day (TID) | INTRAMUSCULAR | Status: DC
  Administered 2024-02-10 – 2024-02-12 (×6): 2 [IU] via SUBCUTANEOUS
  Administered 2024-02-12: 3 [IU] via SUBCUTANEOUS
  Administered 2024-02-13: 2 [IU] via SUBCUTANEOUS
  Administered 2024-02-13: 1 [IU] via SUBCUTANEOUS
  Filled 2024-02-10 (×9): qty 1

## 2024-02-10 NOTE — Assessment & Plan Note (Signed)
 Home PPI equivalent twice daily resumed

## 2024-02-10 NOTE — Consult Note (Signed)
 [facility]  CARDIOLOGY CONSULT NOTE  Patient ID: Jermaine Kramer MRN: [id number] DOB/AGE: 10-04-1950 73 y.o.  Admit date: 02/09/2024 Referring Physician [facility] Primary Physician Perri, ([facility]) Primary Cardiologist Brandii Lakey Reason for Consultation acute on chronic HFrEF  HPI: 73 year old male referred for evaluation of acute on chronic HFrEF.  Patient has a history of HFrEF, atrial fibrillation, and coronary artery disease, status post CABG x 3 (LIMA-LAD, SVG to ramus, SVG to PDA) on 02/16/2021, who presents with 30-month history of worsening shortness of breath, weight gain and peripheral edema.  Patient followed by Ellouise Class FNP at [facility], and has been refractory to outpatient diuretic therapy.  In the emergency room, patient noted to have bilateral peripheral edema, EKG nondiagnostic, chest x-ray revealing pulmonary edema and was treated with dose of IV furosemide .  ECG nondiagnostic.  Patient is status post ICD 06/22/2021.  Patient was seen by nephrology earlier today, and furosemide  IV twice daily was changed to furosemide  drip.  Notable labs include BNP 1871, BUN and creatinine 29 and 2.05, respectively.  Troponin was borderline elevated 27, 21, in the absence of chest pain.  Review of systems complete and found to be negative unless listed above     Past Medical History:  Diagnosis Date   Arrhythmia    atrial fibrillation   CHF (congestive heart failure) (HCC)    Coronary artery disease    Diabetes mellitus without complication (HCC)    GERD (gastroesophageal reflux disease)    Hypercholesteremia    Hypertension    Sleep apnea    Stroke [facility])    2015 or longer ago    Past Surgical History:  Procedure Laterality Date   CATARACT EXTRACTION Bilateral 04/06/2022   CCM GENERATOR AND A/V LEAD INSERTION N/A 09/04/2022   Procedure: CCM GENERATOR AND A/V LEAD INSERTION;  Surgeon: Cindie Ole DASEN, MD;  Location: [facility] INVASIVE CV LAB;  Service:  Cardiovascular;  Laterality: N/A;   CHOLECYSTECTOMY     COLONOSCOPY WITH PROPOFOL  N/A 04/14/2019   Procedure: COLONOSCOPY WITH PROPOFOL ;  Surgeon: Toledo, Ladell POUR, MD;  Location: [facility] ENDOSCOPY;  Service: Gastroenterology;  Laterality: N/A;   CORONARY ARTERY BYPASS GRAFT  02/16/2021   ICD IMPLANT N/A 06/22/2021   Procedure: ICD IMPLANT;  Surgeon: Cindie Ole DASEN, MD;  Location: [facility] INVASIVE CV LAB;  Service: Cardiovascular;  Laterality: N/A;   KNEE ARTHROSCOPY     RENAL CYST EXCISION      Medications Prior to Admission  Medication Sig Dispense Refill Last Dose/Taking   apixaban  (ELIQUIS ) 5 MG TABS tablet Take 1 tablet (5 mg total) by mouth 2 (two) times daily. 180 tablet 3 02/09/2024 Morning   atorvastatin  (LIPITOR ) 80 MG tablet Take 1 tablet (80 mg total) by mouth daily. 30 tablet 5 02/09/2024 Morning   calcitRIOL (ROCALTROL) 0.25 MCG capsule Take 0.25 mcg by mouth.   02/09/2024 Morning   Continuous Glucose Sensor (FREESTYLE LIBRE 2 SENSOR) MISC by Does not apply route.   02/09/2024 Morning   Furosemide  (FUROSCIX ) 80 MG/10ML CTKT Take 80mg  daily as directed. 5 each 0 02/09/2024 Morning   metFORMIN  (GLUCOPHAGE ) 500 MG tablet Take 500 mg by mouth 2 (two) times daily.   02/09/2024 Morning   metolazone  (ZAROXOLYN ) 2.5 MG tablet Take 1 tablet (2.5 mg total) by mouth daily as needed (worsening edema, SOB). 30 tablet 0 Taking As Needed   metoprolol  succinate (TOPROL -XL) 25 MG 24 hr tablet TAKE 1/2 TABLET BY MOUTH EVERY EVENING 45 tablet 3 02/09/2024 Morning  midodrine  (PROAMATINE ) 2.5 MG tablet Take 1 tablet (2.5 mg total) by mouth 2 (two) times daily with a meal. 60 tablet 5 02/09/2024 Morning   omeprazole (PRILOSEC) 40 MG capsule Take 40 mg by mouth 2 (two) times daily.   02/09/2024 Morning   ondansetron  (ZOFRAN -ODT) 4 MG disintegrating tablet Take 1 tablet (4 mg total) by mouth every 8 (eight) hours as needed for nausea or vomiting. 12 tablet 0 Taking As Needed   potassium chloride  SA  (KLOR-CON  M) 20 MEQ tablet Take 2 tablets (40 mEq total) by mouth as directed. By heart failure clinic 60 tablet 0 02/09/2024 Morning   spironolactone  (ALDACTONE ) 25 MG tablet TAKE 1 TABLET (25 MG TOTAL) BY MOUTH DAILY. (Patient taking differently: Take 12.5 mg by mouth daily.) 90 tablet 3 02/09/2024 Morning   tamsulosin  (FLOMAX ) 0.4 MG CAPS capsule Take 1 capsule (0.4 mg total) by mouth daily after supper. (Patient taking differently: Take 0.4 mg by mouth in the morning and at bedtime.)   02/09/2024 Morning   torsemide  (DEMADEX ) 20 MG tablet Take 2 tablets (40 mg total) by mouth daily. (Patient taking differently: Take 20 mg by mouth daily.) 60 tablet 0 02/09/2024 Morning   Insulin  Pen Needle (COMFORT EZ PEN NEEDLES) 32G X 8 MM MISC by Other route.      Social History   Socioeconomic History   Marital status: Married    Spouse name: Not on file   Number of children: Not on file   Years of education: Not on file   Highest education level: Not on file  Occupational History   Not on file  Tobacco Use   Smoking status: Never   Smokeless tobacco: Never  Vaping Use   Vaping status: Never Used  Substance and Sexual Activity   Alcohol use: Not Currently   Drug use: No   Sexual activity: Not Currently  Other Topics Concern   Not on file  Social History Narrative   Not on file   Social Drivers of Health   Financial Resource Strain: Low Risk  (10/24/2023)   Received from [facility]   Overall Financial Resource Strain (CARDIA)    Difficulty of Paying Living Expenses: Not hard at all  Food Insecurity: No Food Insecurity (02/10/2024)   Hunger Vital Sign    Worried About Running Out of Food in the Last Year: Never true    Ran Out of Food in the Last Year: Never true  Transportation Needs: No Transportation Needs (02/10/2024)   PRAPARE - Administrator, Civil Service (Medical): No    Lack of Transportation (Non-Medical): No  Physical Activity: Not on file   Stress: Not on file  Social Connections: Moderately Integrated (02/10/2024)   Social Connection and Isolation Panel    Frequency of Communication with Friends and Family: More than three times a week    Frequency of Social Gatherings with Friends and Family: More than three times a week    Attends Religious Services: More than 4 times per year    Active Member of [employer] or Organizations: No    Attends Banker Meetings: Never    Marital Status: Married  Catering manager Violence: Not At Risk (02/10/2024)   Humiliation, Afraid, Rape, and Kick questionnaire    Fear of Current or Ex-Partner: No    Emotionally Abused: No    Physically Abused: No    Sexually Abused: No    Family History  Problem Relation Age of Onset  Parkinson's disease Mother    Lupus Mother    Heart disease Father       Review of systems complete and found to be negative unless listed above      PHYSICAL EXAM  General: Well developed, well nourished, in no acute distress HEENT:  Normocephalic and atramatic Neck:  No JVD.  Lungs: Clear bilaterally to auscultation and percussion. Heart: HRRR . Normal S1 and S2 without gallops or murmurs.  Abdomen: Bowel sounds are positive, abdomen soft and non-tender  Msk:  Back normal, normal gait. Normal strength and tone for age. Extremities: No clubbing, cyanosis or edema.   Neuro: Alert and oriented X 3. Psych:  Good affect, responds appropriately  Labs:   Lab Results  Component Value Date   WBC 5.1 02/10/2024   HGB 11.0 (L) 02/10/2024   HCT 34.2 (L) 02/10/2024   MCV 82.4 02/10/2024   PLT 146 (L) 02/10/2024    Recent Labs  Lab 02/10/24 0315  NA 138  K 3.9  CL 105  CO2 25  BUN 29*  CREATININE 2.05*  CALCIUM  8.8*  PROT 5.9*  BILITOT 0.8  ALKPHOS 56  ALT 9  AST 13*  GLUCOSE 143*   Lab Results  Component Value Date   CKTOTAL 21 (L) 06/09/2022   TROPONINI < 0.02 10/19/2013   No results found for: CHOL No results found for:  HDL No results found for: LDLCALC No results found for: TRIG No results found for: CHOLHDL No results found for: LDLDIRECT    Radiology: US  Venous Img Lower Bilateral Result Date: 02/09/2024 CLINICAL DATA:  Leg swelling EXAM: BILATERAL LOWER EXTREMITY VENOUS DOPPLER ULTRASOUND TECHNIQUE: Gray-scale sonography with compression, as well as color and duplex ultrasound, were performed to evaluate the deep venous system(s) from the level of the common femoral vein through the popliteal and proximal calf veins. COMPARISON:  None Available. FINDINGS: VENOUS Normal compressibility of the common femoral, superficial femoral, and popliteal veins, as well as the visualized calf veins. Visualized portions of profunda femoral vein and great saphenous vein unremarkable. No filling defects to suggest DVT on grayscale or color Doppler imaging. Doppler waveforms show normal direction of venous flow, normal respiratory plasticity and response to augmentation. OTHER Subcutaneous soft tissue edema of bilateral lower legs. Heterogeneously hypoechoic, ovoid structures in the proximal medial thighs without appreciable internal vascularity on color Doppler evaluation measure 4.3 x 2.6 x 1.4 cm on the right and 4.0 x 1.8 x 1.3 cm on the left. Limitations: none IMPRESSION: 1. No evidence of deep venous thrombosis in the bilateral lower extremities. 2. Bilateral lower leg soft tissue edema. 3. Heterogeneously hypoechoic, ovoid structures in the proximal medial thighs without appreciable internal vascularity on color Doppler evaluation measure 4.3 x 2.6 x 1.4 cm on the right and 4.0 x 1.8 x 1.3 cm on the left. These are nonspecific and may represent hematomas, prominent musculature, or other soft tissue mass. Recommend further evaluation with CT or MRI of the thighs. Electronically Signed   By: Limin  Xu M.D.   On: 02/09/2024 12:44   DG Chest Portable 1 View Result Date: 02/09/2024 EXAM: 1 VIEW(S) XRAY OF THE CHEST  02/09/2024 09:24:00 AM COMPARISON: 01/19/2024 CLINICAL HISTORY: sob. Pt c/o SOB for the past couple of months, that has gotten worse the past week or so. States he started taken 80mg  of Lasix  on Thursday once a day and it has not helped. Report some abd distention. Also c/o of tightness in his chest and some cough and ;  congestion. Pt has a hx of CHF. Pt is A\T\Ox4, pt labored and tachypneic. FINDINGS: LINES, TUBES AND DEVICES: Stable left AICD with 2 leads. Stable right AICD with 2 leads. LUNGS AND PLEURA: Mild pulmonary edema. Bibasilar atelectasis. Small bilateral pleural effusions. No pneumothorax. HEART AND MEDIASTINUM: Cardiomegaly. CABG markers noted. BONES AND SOFT TISSUES: Sternotomy wires noted. No acute osseous abnormality. IMPRESSION: 1. Mild pulmonary edema with bibasilar atelectasis 2. Small bilateral pleural effusions Electronically signed by: Waddell Calk MD 02/09/2024 09:53 AM EDT RP Workstation: [id number]   US  ASCITES (ABDOMEN LIMITED) Result Date: 01/19/2024 CLINICAL DATA:  Congestive heart failure.  Previous cholecystectomy. EXAM: LIMITED ABDOMEN ULTRASOUND FOR ASCITES TECHNIQUE: Limited ultrasound survey for ascites was performed in all four abdominal quadrants. COMPARISON:  CT abdomen 12/22/2022 FINDINGS: Minimal ascites over the right upper and right lower quadrants with trace free fluid adjacent the spleen in the left upper quadrant. No significant free fluid in the left lower quadrant this free fluid is new since the previous CT of August 2024. IMPRESSION: Minimal ascites as described. Electronically Signed   By: Toribio Agreste M.D.   On: 01/19/2024 15:02   DG Chest 2 View Result Date: 01/19/2024 CLINICAL DATA:  73 year old male with shortness of breath and unintentional weight gain. Chest tightness. EXAM: CHEST - 2 VIEW COMPARISON:  Chest radiographs 09/04/2022 and earlier. FINDINGS: PA and lateral views 0732 hours. Bilateral cardiac pacemaker/ICD are stable. Prior CABG. Mild  cardiomegaly. Stable to mildly improved lung volumes from last year. No pneumothorax. No pulmonary edema. No convincing pleural effusion or acute lung opacity. Streaky mild infrahilar opacity appears to be chronic, stable since 2023. Hyperostosis related thoracic spinal ankylosis. No acute osseous abnormality identified. Small surgical clips in the visible upper abdomen. Nonobstructed bowel-gas pattern. IMPRESSION: 1. No acute cardiopulmonary abnormality. 2. Chronic cardiac pacemakers/ICD. Stable mild cardiomegaly. Prior CABG. Electronically Signed   By: VEAR Hurst M.D.   On: 01/19/2024 07:52    EKG: Sinus rhythm with right bundle branch block at 94 bpm  ASSESSMENT AND PLAN:   1.  Acute on chronic HFrEF, LVEF 20-25% 06/28/2022, with 65-month history of worsening exertional dyspnea, peripheral edema, refractory to outpatient diuretic therapy.  BNP 1871.  Patient initially treated with IV furosemide , now on furosemide  drip per nephrology. 2.  CAD, status post CABG x 3 (LIMA-LAD, SVG-ramus, SVG-PDA 02/16/2021) 3.  Borderline elevated troponin, likely demand supply ischemia, in the absence of chest pain, in the setting of acute on chronic HFrEF 4.  CKD stage IV, been seen by nephrology who changed IV furosemide  to furosemide  drip 5.  Paroxysmal atrial fibrillation, rate controlled, on Eliquis  for stroke prevention 6.  Status post ICD  Recommendations  1.  Agree with current therapy 2.  Continue diuresis 3.  Carefully monitor renal status, per nephrology 4.  Continue Eliquis  for stroke prevention 5.  Defer further cardiac diagnostics at this time  Signed: Marsa Dooms MD,PhD, FACC 02/10/2024, 10:00 AM

## 2024-02-10 NOTE — Plan of Care (Signed)

## 2024-02-10 NOTE — Assessment & Plan Note (Signed)
 Suspect cardiorenal, nephrology has been consulted at patient's request Nephrology recommends continuous furosemide  infusion Strict I's and O's Recheck BMP in a.m.

## 2024-02-10 NOTE — Assessment & Plan Note (Signed)
 Atorvastatin  80 mg daily

## 2024-02-10 NOTE — Assessment & Plan Note (Signed)
 Home midodrine  2.5 mg p.o. twice daily with meals

## 2024-02-10 NOTE — Progress Notes (Signed)
 PROGRESS NOTE  Jermaine Kramer  FMW:[id number] DOB: 05/14/1950 DOA: 02/09/2024 PCP: Perri Constance Sor, PA-C   Jermaine Kramer is a 73 year old male with history of CAD, hyperlipidemia, paroxysmal atrial fibrillation on Eliquis , hypertension, GERD, non-insulin -dependent diabetes mellitus, CKD stage IIIb, who presents emergency department for chief concerns of shortness of breath for 1 week.  02/09/24: Patient was admitted to hospital service for heart failure exacerbation.  02/10/2024: Nephrology has been consulted and initiated furosemide  infusion.  Continue strict I's and O's.  Assessment & Plan:   Principal Problem:   CHF (congestive heart failure) (HCC) Active Problems:   AKI (acute kidney injury) with metabolic acidosis(HCC)   Hypotension   DM (diabetes mellitus), type 2 with coma (HCC)   GERD (gastroesophageal reflux disease)   Hyperlipidemia LDL goal <70   Dilated cardiomyopathy (HCC)   CAD s/p CABG x 3   Chronic combined systolic and diastolic congestive heart failure (HCC)   Assessment and Plan:  * CHF (congestive heart failure) (HCC) Patient baseline weight is 180 pounds, and on admission he was 187 Strict I's and O's Furosemide  40 mg IV twice daily Home spironolactone  12.5 mg daily resumed Metoprolol  succinate 12.5 mg daily resumed  AKI (acute kidney injury) with metabolic acidosis(HCC) Suspect cardiorenal, nephrology has been consulted at patient's request Nephrology recommends continuous furosemide  infusion Strict I's and O's Recheck BMP in a.m.  Hypotension Home midodrine  2.5 mg p.o. twice daily with meals  Hyperlipidemia LDL goal <70 Atorvastatin  80 mg daily  GERD (gastroesophageal reflux disease) Home PPI equivalent twice daily resumed  DM (diabetes mellitus), type 2 with coma (HCC) Non-insulin  diabetes mellitus type 2 01/19/2024: A1c 7.1% Home metformin  will not be resumed on admission Insulin  SSI with at bedtime coverage ordered  DVT prophylaxis:  Apixaban  Code Status: Full code Family Communication: Updated spouse (of 49 years) at bedside with patient's permission Disposition Plan: Pending clinical course Level of care: Progressive  Consultants:  Nephrology  Procedures:  None at this time  Antimicrobials: Not indicated  Subjective:  At bedside patient was able to tell me his first month name, age, location, current, the year.  He reports some worsening wheezing and tightness when he lays to flat on the bed.  Patient reports the symptoms are improved when his bed angle is increased to about 40 to 45 degrees.  He reports that at home, he sleeps on 2-3 pillows now.  Objective: Vitals:   02/09/24 2235 02/10/24 0430 02/10/24 0527 02/10/24 1034  BP: 105/75 92/66  99/73  Pulse: 93 91  90  Resp: 16     Temp: 97.9 F (36.6 C)   97.6 F (36.4 C)  TempSrc:    Oral  SpO2: 95% 96%  97%  Weight:   83.1 kg   Height:       Intake/Output Summary (Last 24 hours) at 02/10/2024 1118 Last data filed at 02/10/2024 0600 Gross per 24 hour  Intake 120 ml  Output 950 ml  Net -830 ml   Filed Weights   02/09/24 0908 02/10/24 0527  Weight: 84.8 kg 83.1 kg   Examination:  General exam: Appears calm and comfortable  Respiratory system: Clear to auscultation. Respiratory effort normal. Cardiovascular system: S1 & S2 heard, RRR. No JVD, murmurs, rubs, gallops or clicks. No pedal edema. Gastrointestinal system: Obese abdomen, abdomen is nondistended, soft and nontender. No organomegaly or masses felt. Normal bowel sounds heard. Central nervous system: Alert and oriented. No focal neurological deficits. Extremities: Symmetric 5 x 5 power. Skin: No  rashes, lesions or ulcers Psychiatry: Judgement and insight appear normal. Mood & affect appropriate.   Data Reviewed: I have personally reviewed following labs and imaging studies  CBC: Recent Labs  Lab 02/09/24 0921 02/10/24 0315  WBC 5.0 5.1  HGB 11.8* 11.0*  HCT 38.3* 34.2*  MCV  84.7 82.4  PLT 155 146*   Basic Metabolic Panel: Recent Labs  Lab 02/09/24 0921 02/10/24 0315 02/10/24 1041  NA 138 138  --   K 4.3 3.9  --   CL 102 105  --   CO2 26 25  --   GLUCOSE 241* 143*  --   BUN 31* 29*  --   CREATININE 2.33* 2.05*  --   CALCIUM  9.0 8.8*  --   MG  --   --  1.5*   GFR: Estimated Creatinine Clearance: 30.9 mL/min (A) (by C-G formula based on SCr of 2.05 mg/dL (H)).  Liver Function Tests: Recent Labs  Lab 02/10/24 0315  AST 13*  ALT 9  ALKPHOS 56  BILITOT 0.8  PROT 5.9*  ALBUMIN 3.3*   Coagulation Profile: Recent Labs  Lab 02/10/24 0315  INR 1.6*   CBG: Recent Labs  Lab 02/10/24 0854  GLUCAP 230*   Radiology Studies: US  Venous Img Lower Bilateral Result Date: 02/09/2024 CLINICAL DATA:  Leg swelling EXAM: BILATERAL LOWER EXTREMITY VENOUS DOPPLER ULTRASOUND TECHNIQUE: Gray-scale sonography with compression, as well as color and duplex ultrasound, were performed to evaluate the deep venous system(s) from the level of the common femoral vein through the popliteal and proximal calf veins. COMPARISON:  None Available. FINDINGS: VENOUS Normal compressibility of the common femoral, superficial femoral, and popliteal veins, as well as the visualized calf veins. Visualized portions of profunda femoral vein and great saphenous vein unremarkable. No filling defects to suggest DVT on grayscale or color Doppler imaging. Doppler waveforms show normal direction of venous flow, normal respiratory plasticity and response to augmentation. OTHER Subcutaneous soft tissue edema of bilateral lower legs. Heterogeneously hypoechoic, ovoid structures in the proximal medial thighs without appreciable internal vascularity on color Doppler evaluation measure 4.3 x 2.6 x 1.4 cm on the right and 4.0 x 1.8 x 1.3 cm on the left. Limitations: none IMPRESSION: 1. No evidence of deep venous thrombosis in the bilateral lower extremities. 2. Bilateral lower leg soft tissue edema. 3.  Heterogeneously hypoechoic, ovoid structures in the proximal medial thighs without appreciable internal vascularity on color Doppler evaluation measure 4.3 x 2.6 x 1.4 cm on the right and 4.0 x 1.8 x 1.3 cm on the left. These are nonspecific and may represent hematomas, prominent musculature, or other soft tissue mass. Recommend further evaluation with CT or MRI of the thighs. Electronically Signed   By: Limin  Xu M.D.   On: 02/09/2024 12:44   DG Chest Portable 1 View Result Date: 02/09/2024 EXAM: 1 VIEW(S) XRAY OF THE CHEST 02/09/2024 09:24:00 AM COMPARISON: 01/19/2024 CLINICAL HISTORY: sob. Pt c/o SOB for the past couple of months, that has gotten worse the past week or so. States he started taken 80mg  of Lasix  on Thursday once a day and it has not helped. Report some abd distention. Also c/o of tightness in his chest and some cough and ; congestion. Pt has a hx of CHF. Pt is A\T\Ox4, pt labored and tachypneic. FINDINGS: LINES, TUBES AND DEVICES: Stable left AICD with 2 leads. Stable right AICD with 2 leads. LUNGS AND PLEURA: Mild pulmonary edema. Bibasilar atelectasis. Small bilateral pleural effusions. No pneumothorax. HEART AND MEDIASTINUM:  Cardiomegaly. CABG markers noted. BONES AND SOFT TISSUES: Sternotomy wires noted. No acute osseous abnormality. IMPRESSION: 1. Mild pulmonary edema with bibasilar atelectasis 2. Small bilateral pleural effusions Electronically signed by: Taylor Stroud MD 02/09/2024 09:53 AM EDT RP Workstation: [id number]   Scheduled Meds:  apixaban   5 mg Oral BID   atorvastatin   80 mg Oral Daily   calcitRIOL  0.25 mcg Oral Daily   insulin  aspart  0-5 Units Subcutaneous QHS   insulin  aspart  0-9 Units Subcutaneous TID WC   metoprolol  succinate  12.5 mg Oral QPM   midodrine   2.5 mg Oral BID WC   pantoprazole   80 mg Oral BID   spironolactone   12.5 mg Oral Daily   tamsulosin   0.4 mg Oral QPC breakfast   Continuous Infusions:  furosemide  (LASIX ) 200 mg in dextrose  5 % 100 mL (2  mg/mL) infusion     magnesium  sulfate bolus IVPB      LOS: 1 day   Time spent: 35 minutes  Dr. Sherre [facility] If 7PM-7AM, please contact night-coverage 02/10/2024, 11:18 AM

## 2024-02-10 NOTE — Hospital Course (Signed)
 [name] is a 73 year old male with history of CAD, hyperlipidemia, paroxysmal atrial fibrillation on Eliquis , hypertension, GERD, non-insulin -dependent diabetes mellitus, CKD stage IIIb, who presents emergency department for chief concerns of shortness of breath for 1 week.  02/09/24: Patient was admitted to hospital service for heart failure exacerbation.  02/10/2024: Nephrology has been consulted and initiated furosemide  infusion.  Continue strict I's and O's.  02/11/2024: Continue furosemide  infusion, strict I's and O's.  Nephrology and cardiology have been consulted.  Continue close monitoring of renal function.  sCr 3 weeks ago was 1.96-1.98/eGFR 35.  02/12/2024: Furosemide  infusion will be discontinued with transition to p.o. antibiotic.  Cardiology agreed nothing from a cardiology standpoint.  Pending discharge tomorrow.

## 2024-02-10 NOTE — Progress Notes (Signed)
 [facility]  ROUNDING NOTE   Subjective:   Patient well-known to us  from the office.  He has known past medical history of atrial fibrillation, hyperlipidemia, diabetes mellitus type 2, chronic systolic heart failure ejection fraction 30 to 35%, obstructive sleep apnea, hypertension, history of CVA, GERD, coronary disease status post CABG, chronic kidney disease stage IIIb who presented with increasing shortness of breath and acute exacerbation of chronic systolic heart failure.  He was started on Lasix  40 mg IV twice a day.  Objective:  Vital signs in last 24 hours:  Temp:  [97.6 F (36.4 C)-98.1 F (36.7 C)] 97.9 F (36.6 C) (10/11 2235) Pulse Rate:  [81-109] 91 (10/12 0430) Resp:  [12-27] 16 (10/11 2235) BP: (85-127)/(63-82) 92/66 (10/12 0430) SpO2:  [92 %-100 %] 96 % (10/12 0430) Weight:  [83.1 kg-84.8 kg] 83.1 kg (10/12 0527)  Weight change:  Filed Weights   02/09/24 0908 02/10/24 0527  Weight: 84.8 kg 83.1 kg    Intake/Output: I/O last 3 completed shifts: In: 120 [P.O.:120] Out: 950 [Urine:950]   Intake/Output this shift:  No intake/output data recorded.  Physical Exam: General: No acute distress  Head: Normocephalic, atraumatic. Moist oral mucosal membranes  Neck: Supple  Lungs:  Basilar rales  Heart: S1S2 no rubs  Abdomen:  Soft, nontender, bowel sounds present  Extremities: 1+ peripheral edema.  Neurologic: Awake, alert, following commands  Skin: No acute rash  Access: No hemodialysis access    Basic Metabolic Panel: Recent Labs  Lab 02/09/24 0921 02/10/24 0315  NA 138 138  K 4.3 3.9  CL 102 105  CO2 26 25  GLUCOSE 241* 143*  BUN 31* 29*  CREATININE 2.33* 2.05*  CALCIUM  9.0 8.8*    Liver Function Tests: Recent Labs  Lab 02/10/24 0315  AST 13*  ALT 9  ALKPHOS 56  BILITOT 0.8  PROT 5.9*  ALBUMIN 3.3*   No results for input(s): LIPASE, AMYLASE in the last 168 hours. No results for input(s): AMMONIA in the last 168  hours.  CBC: Recent Labs  Lab 02/09/24 0921 02/10/24 0315  WBC 5.0 5.1  HGB 11.8* 11.0*  HCT 38.3* 34.2*  MCV 84.7 82.4  PLT 155 146*    Cardiac Enzymes: No results for input(s): CKTOTAL, CKMB, CKMBINDEX, TROPONINI in the last 168 hours.  BNP: Invalid input(s): POCBNP  CBG: Recent Labs  Lab 02/10/24 0854  GLUCAP 230*    Microbiology: Results for orders placed or performed during the hospital encounter of 01/19/24  Resp panel by RT-PCR (RSV, Flu A&B, Covid) Anterior Nasal Swab     Status: None   Collection Time: 01/19/24  7:22 AM   Specimen: Anterior Nasal Swab  Result Value Ref Range Status   SARS Coronavirus 2 by RT PCR NEGATIVE NEGATIVE Final    Comment: (NOTE) SARS-CoV-2 target nucleic acids are NOT DETECTED.  The SARS-CoV-2 RNA is generally detectable in upper respiratory specimens during the acute phase of infection. The lowest concentration of SARS-CoV-2 viral copies this assay can detect is 138 copies/mL. A negative result does not preclude SARS-Cov-2 infection and should not be used as the sole basis for treatment or other patient management decisions. A negative result may occur with  improper specimen collection/handling, submission of specimen other than nasopharyngeal swab, presence of viral mutation(s) within the areas targeted by this assay, and inadequate number of viral copies(<138 copies/mL). A negative result must be combined with clinical observations, patient history, and epidemiological information. The expected result is Negative.  Fact  Sheet for Patients:  BloggerCourse.com  Fact Sheet for Healthcare Providers:  SeriousBroker.it  This test is no t yet approved or cleared by the [place]  FDA and  has been authorized for detection and/or diagnosis of SARS-CoV-2 by FDA under an Emergency Use Authorization (EUA). This EUA will remain  in effect (meaning this test can be used)  for the duration of the COVID-19 declaration under Section 564(b)(1) of the Act, 21 U.S.C.section 360bbb-3(b)(1), unless the authorization is terminated  or revoked sooner.       Influenza A by PCR NEGATIVE NEGATIVE Final   Influenza B by PCR NEGATIVE NEGATIVE Final    Comment: (NOTE) The Xpert Xpress SARS-CoV-2/FLU/RSV plus assay is intended as an aid in the diagnosis of influenza from Nasopharyngeal swab specimens and should not be used as a sole basis for treatment. Nasal washings and aspirates are unacceptable for Xpert Xpress SARS-CoV-2/FLU/RSV testing.  Fact Sheet for Patients: BloggerCourse.com  Fact Sheet for Healthcare Providers: SeriousBroker.it  This test is not yet approved or cleared by the [place]  FDA and has been authorized for detection and/or diagnosis of SARS-CoV-2 by FDA under an Emergency Use Authorization (EUA). This EUA will remain in effect (meaning this test can be used) for the duration of the COVID-19 declaration under Section 564(b)(1) of the Act, 21 U.S.C. section 360bbb-3(b)(1), unless the authorization is terminated or revoked.     Resp Syncytial Virus by PCR NEGATIVE NEGATIVE Final    Comment: (NOTE) Fact Sheet for Patients: BloggerCourse.com  Fact Sheet for Healthcare Providers: SeriousBroker.it  This test is not yet approved or cleared by the [place]  FDA and has been authorized for detection and/or diagnosis of SARS-CoV-2 by FDA under an Emergency Use Authorization (EUA). This EUA will remain in effect (meaning this test can be used) for the duration of the COVID-19 declaration under Section 564(b)(1) of the Act, 21 U.S.C. section 360bbb-3(b)(1), unless the authorization is terminated or revoked.  Performed at [facility], [address] [place], [place] [address]     Coagulation Studies: Recent Labs     02/10/24 0315  LABPROT 20.3*  INR 1.6*    Urinalysis: No results for input(s): COLORURINE, LABSPEC, PHURINE, GLUCOSEU, HGBUR, BILIRUBINUR, KETONESUR, PROTEINUR, UROBILINOGEN, NITRITE, LEUKOCYTESUR in the last 72 hours.  Invalid input(s): APPERANCEUR    Imaging: US  Venous Img Lower Bilateral Result Date: 02/09/2024 CLINICAL DATA:  Leg swelling EXAM: BILATERAL LOWER EXTREMITY VENOUS DOPPLER ULTRASOUND TECHNIQUE: Gray-scale sonography with compression, as well as color and duplex ultrasound, were performed to evaluate the deep venous system(s) from the level of the common femoral vein through the popliteal and proximal calf veins. COMPARISON:  None Available. FINDINGS: VENOUS Normal compressibility of the common femoral, superficial femoral, and popliteal veins, as well as the visualized calf veins. Visualized portions of profunda femoral vein and great saphenous vein unremarkable. No filling defects to suggest DVT on grayscale or color Doppler imaging. Doppler waveforms show normal direction of venous flow, normal respiratory plasticity and response to augmentation. OTHER Subcutaneous soft tissue edema of bilateral lower legs. Heterogeneously hypoechoic, ovoid structures in the proximal medial thighs without appreciable internal vascularity on color Doppler evaluation measure 4.3 x 2.6 x 1.4 cm on the right and 4.0 x 1.8 x 1.3 cm on the left. Limitations: none IMPRESSION: 1. No evidence of deep venous thrombosis in the bilateral lower extremities. 2. Bilateral lower leg soft tissue edema. 3. Heterogeneously hypoechoic, ovoid structures in the proximal medial thighs without appreciable internal vascularity on color Doppler  evaluation measure 4.3 x 2.6 x 1.4 cm on the right and 4.0 x 1.8 x 1.3 cm on the left. These are nonspecific and may represent hematomas, prominent musculature, or other soft tissue mass. Recommend further evaluation with CT or MRI of the thighs. Electronically  Signed   By: Limin  Xu M.D.   On: 02/09/2024 12:44   DG Chest Portable 1 View Result Date: 02/09/2024 EXAM: 1 VIEW(S) XRAY OF THE CHEST 02/09/2024 09:24:00 AM COMPARISON: 01/19/2024 CLINICAL HISTORY: sob. Pt c/o SOB for the past couple of months, that has gotten worse the past week or so. States he started taken 80mg  of Lasix  on Thursday once a day and it has not helped. Report some abd distention. Also c/o of tightness in his chest and some cough and ; congestion. Pt has a hx of CHF. Pt is A\T\Ox4, pt labored and tachypneic. FINDINGS: LINES, TUBES AND DEVICES: Stable left AICD with 2 leads. Stable right AICD with 2 leads. LUNGS AND PLEURA: Mild pulmonary edema. Bibasilar atelectasis. Small bilateral pleural effusions. No pneumothorax. HEART AND MEDIASTINUM: Cardiomegaly. CABG markers noted. BONES AND SOFT TISSUES: Sternotomy wires noted. No acute osseous abnormality. IMPRESSION: 1. Mild pulmonary edema with bibasilar atelectasis 2. Small bilateral pleural effusions Electronically signed by: Waddell Calk MD 02/09/2024 09:53 AM EDT RP Workstation: [id number]     Medications:     apixaban   5 mg Oral BID   atorvastatin   80 mg Oral Daily   calcitRIOL  0.25 mcg Oral Daily   furosemide   40 mg Intravenous BID   metoprolol  succinate  12.5 mg Oral QPM   midodrine   2.5 mg Oral BID WC   tamsulosin   0.4 mg Oral QPC breakfast   acetaminophen  **OR** acetaminophen , ondansetron  **OR** ondansetron  (ZOFRAN ) IV, oxyCODONE , polyethylene glycol  Assessment/ Plan:  73 y.o. male with past medical history of atrial fibrillation, hyperlipidemia, diabetes mellitus type 2 with chronic kidney disease, chronic systolic heart failure ejection fraction 20 to 25%, obstructive sleep apnea, hypertension, history of CVA, GERD, coronary disease status post CABG who returns now with shortness of breath and acute exacerbation of chronic systolic heart failure.  1.  Acute kidney injury/chronic kidney disease stage IIIb likely  secondary to cardiorenal syndrome.  Recent baseline creatinine appears to be 1.9.  Creatinine currently 2.05 with eGFR 33.  BNP high at 1871.  Discontinue bolus Lasix  and transition to Lasix  drip at 5 mg/h and titrate as necessary.  No immediate need for dialysis at the moment.  2.  Acute on chronic systolic heart failure exacerbation.  Ejection fraction 20 to 25% and quite low at baseline.  DC bolus Lasix  and transition to Lasix  drip at 5 mg/h and titrate as necessary.  3.  Secondary hyperparathyroidism.  Maintain the patient Calcitrol 0.25 mcg p.o. daily.  Monitor bone metabolism parameters.  4.  Thanks for consultation.   LOS: 1 Jermaine Kramer 10/12/20259:02 AM

## 2024-02-10 NOTE — Assessment & Plan Note (Signed)
 Non-insulin  diabetes mellitus type 2 01/19/2024: A1c 7.1% Home metformin  will not be resumed on admission Insulin  SSI with at bedtime coverage ordered

## 2024-02-10 NOTE — Plan of Care (Signed)
  Problem: Education: Goal: Ability to demonstrate management of disease process will improve Outcome: Progressing   Problem: Activity: Goal: Capacity to carry out activities will improve Outcome: Progressing   Problem: Activity: Goal: Risk for activity intolerance will decrease Outcome: Progressing   Problem: Elimination: Goal: Will not experience complications related to bowel motility Outcome: Progressing

## 2024-02-10 NOTE — Assessment & Plan Note (Addendum)
 Patient baseline weight is 180 pounds, and on admission he was 187 Strict I's and O's Furosemide  40 mg IV twice daily Home spironolactone  12.5 mg daily resumed Metoprolol  succinate 12.5 mg daily resumed

## 2024-02-11 ENCOUNTER — Encounter

## 2024-02-11 ENCOUNTER — Inpatient Hospital Stay

## 2024-02-11 DIAGNOSIS — I5023 Acute on chronic systolic (congestive) heart failure: Secondary | ICD-10-CM | POA: Diagnosis not present

## 2024-02-11 LAB — BASIC METABOLIC PANEL WITH GFR
Anion gap: 11 (ref 5–15)
BUN: 30 mg/dL — ABNORMAL HIGH (ref 8–23)
CO2: 26 mmol/L (ref 22–32)
Calcium: 8.8 mg/dL — ABNORMAL LOW (ref 8.9–10.3)
Chloride: 101 mmol/L (ref 98–111)
Creatinine, Ser: 2.01 mg/dL — ABNORMAL HIGH (ref 0.61–1.24)
GFR, Estimated: 34 mL/min — ABNORMAL LOW (ref >60)
Glucose, Bld: 155 mg/dL — ABNORMAL HIGH (ref 70–99)
Potassium: 4 mmol/L (ref 3.5–5.1)
Sodium: 138 mmol/L (ref 135–145)

## 2024-02-11 LAB — CBC
HCT: 35.7 % — ABNORMAL LOW (ref 39.0–52.0)
Hemoglobin: 11.2 g/dL — ABNORMAL LOW (ref 13.0–17.0)
MCH: 26 pg (ref 26.0–34.0)
MCHC: 31.4 g/dL (ref 30.0–36.0)
MCV: 83 fL (ref 80.0–100.0)
Platelets: 152 K/uL (ref 150–400)
RBC: 4.3 MIL/uL (ref 4.22–5.81)
RDW: 14.6 % (ref 11.5–15.5)
WBC: 5.1 K/uL (ref 4.0–10.5)
nRBC: 0 % (ref 0.0–0.2)

## 2024-02-11 LAB — MAGNESIUM: Magnesium: 1.9 mg/dL (ref 1.7–2.4)

## 2024-02-11 LAB — GLUCOSE, CAPILLARY
Glucose-Capillary: 180 mg/dL — ABNORMAL HIGH (ref 70–99)
Glucose-Capillary: 219 mg/dL — ABNORMAL HIGH (ref 70–99)
Glucose-Capillary: 191 mg/dL — ABNORMAL HIGH (ref 70–99)
Glucose-Capillary: 185 mg/dL — ABNORMAL HIGH (ref 70–99)

## 2024-02-11 MED ORDER — TECHNETIUM TO 99M ALBUMIN AGGREGATED
4.0000 | Freq: Once | INTRAVENOUS | Status: AC | PRN
  Administered 2024-02-11: 4.33 via INTRAVENOUS

## 2024-02-11 NOTE — TOC CM/SW Note (Signed)
 Transition of Care [facility]) CM/SW Note    Transition of Care [facility]) - Inpatient Brief Assessment   Patient Details  Name: Jermaine Kramer MRN: [id number] Date of Birth: 02-May-1950  Transition of Care [facility]) CM/SW Contact:    Alfonso Rummer, LCSW Phone Number: 02/11/2024, 3:49 PM   Clinical Narrative:  CSW A. Mathilde Mcwherter completed TOC Chart Review. No current TOC needs identified please contact should needs arise.   Transition of Care Asessment: Insurance and Status: Insurance coverage has been reviewed Patient has primary care physician: Yes (POWELL, MYCHAL KELLY) Home environment has been reviewed: Single Family Home   Prior/Current Home Services: No current home services Social Drivers of Health Review: SDOH reviewed no interventions necessary Readmission risk has been reviewed: No Transition of care needs: no transition of care needs at this time

## 2024-02-11 NOTE — Progress Notes (Signed)
 PROGRESS NOTE  [name]  FMW:[id number] DOB: Dec 10, 1950 DOA: 02/09/2024 PCP: Perri Constance Sor, PA-C   Jermaine Kramer is a 73 year old male with history of CAD, hyperlipidemia, paroxysmal atrial fibrillation on Eliquis , hypertension, GERD, non-insulin -dependent diabetes mellitus, CKD stage IIIb, who presents emergency department for chief concerns of shortness of breath for 1 week.  02/09/24: Patient was admitted to hospital service for heart failure exacerbation.  02/10/2024: Nephrology has been consulted and initiated furosemide  infusion.  Continue strict I's and O's.  02/11/2024: Continue furosemide  infusion, strict I's and O's.  Nephrology and cardiology have been consulted.  Continue close monitoring of renal function.  sCr 3 weeks ago was 1.96-1.98/eGFR 35.  Assessment & Plan:   Principal Problem:   CHF (congestive heart failure) (HCC) Active Problems:   AKI (acute kidney injury) with metabolic acidosis(HCC)   Hypotension   DM (diabetes mellitus), type 2 with coma (HCC)   GERD (gastroesophageal reflux disease)   Hyperlipidemia LDL goal <70   Dilated cardiomyopathy (HCC)   CAD s/p CABG x 3   Chronic combined systolic and diastolic congestive heart failure (HCC)   Assessment and Plan:  * CHF (congestive heart failure) (HCC) Patient baseline weight is 180 pounds, and on admission he was 187 Strict I's and O's Furosemide  40 mg IV twice daily Home spironolactone  12.5 mg daily resumed Metoprolol  succinate 12.5 mg daily resumed  AKI (acute kidney injury) with metabolic acidosis(HCC) Suspect cardiorenal, nephrology has been consulted at patient's request Nephrology recommends continuous furosemide  infusion Strict I's and O's Recheck BMP in a.m.  Hypotension Home midodrine  2.5 mg p.o. twice daily with meals  Hyperlipidemia LDL goal <70 Atorvastatin  80 mg daily  GERD (gastroesophageal reflux disease) Home PPI equivalent twice daily resumed  DM (diabetes  mellitus), type 2 with coma (HCC) Non-insulin  diabetes mellitus type 2 01/19/2024: A1c 7.1% Home metformin  will not be resumed on admission Insulin  SSI with at bedtime coverage ordered  DVT prophylaxis: Apixaban  Code Status: Full code Family Communication: No family was at bedside Disposition Plan: Pending clinical course Level of care: Progressive  Consultants:  Nephrology, cardiology  Procedures:  None at this time  Antimicrobials: None indicated  Subjective:  At bedside patient was awake alert and oriented x 3, he does not appear to be in acute distress.  He reports baseline chest tightness that has been ongoing for years.  He denies shortness of breath, dysuria, hematuria, diarrhea.  Objective: Vitals:   02/11/24 0358 02/11/24 0500 02/11/24 0735 02/11/24 0754  BP: 97/69   98/71  Pulse: 80   86  Resp: 17   18  Temp: 97.9 F (36.6 C)   97.7 F (36.5 C)  TempSrc:      SpO2: 95%  95% 97%  Weight:  82 kg    Height:       Intake/Output Summary (Last 24 hours) at 02/11/2024 1016 Last data filed at 02/11/2024 1007 Gross per 24 hour  Intake 155.85 ml  Output 1150 ml  Net -994.15 ml   Filed Weights   02/09/24 0908 02/10/24 0527 02/11/24 0500  Weight: 84.8 kg 83.1 kg 82 kg   Examination:  General exam: Appears calm and comfortable  Respiratory system: Clear to auscultation. Respiratory effort normal. Cardiovascular system: S1 & S2 heard, RRR. No JVD, murmurs, rubs, gallops or clicks. No pedal edema. Gastrointestinal system: Obese abdomen, abdomen is nondistended, soft and nontender. No organomegaly or masses felt. Normal bowel sounds heard. Central nervous system: Alert and oriented. No focal neurological deficits. Extremities:  Symmetric 5 x 5 power. Skin: No rashes, lesions or ulcers Psychiatry: Judgement and insight appear normal. Mood & affect appropriate.   Data Reviewed: I have personally reviewed following labs and imaging studies  CBC: Recent Labs  Lab  02/09/24 0921 02/10/24 0315 02/11/24 0419  WBC 5.0 5.1 5.1  HGB 11.8* 11.0* 11.2*  HCT 38.3* 34.2* 35.7*  MCV 84.7 82.4 83.0  PLT 155 146* 152   Basic Metabolic Panel: Recent Labs  Lab 02/09/24 0921 02/10/24 0315 02/10/24 1041 02/11/24 0419  NA 138 138  --  138  K 4.3 3.9  --  4.0  CL 102 105  --  101  CO2 26 25  --  26  GLUCOSE 241* 143*  --  155*  BUN 31* 29*  --  30*  CREATININE 2.33* 2.05*  --  2.01*  CALCIUM  9.0 8.8*  --  8.8*  MG  --   --  1.5* 1.9   GFR: Estimated Creatinine Clearance: 31.3 mL/min (A) (by C-G formula based on SCr of 2.01 mg/dL (H)).  Liver Function Tests: Recent Labs  Lab 02/10/24 0315  AST 13*  ALT 9  ALKPHOS 56  BILITOT 0.8  PROT 5.9*  ALBUMIN 3.3*   Coagulation Profile: Recent Labs  Lab 02/10/24 0315  INR 1.6*   CBG: Recent Labs  Lab 02/10/24 0854 02/10/24 1237 02/10/24 1650 02/10/24 2024 02/11/24 0751  GLUCAP 230* 192* 160* 168* 180*   Radiology Studies: DG Chest 2 View Result Date: 02/11/2024 CLINICAL DATA:  Shortness of breath. EXAM: CHEST - 2 VIEW COMPARISON:  02/09/2024 and CT chest 07/24/2021. FINDINGS: Trachea is midline. Heart is enlarged, stable. Pacemaker and ICD lead tips are stable in position. Lungs are somewhat low in volume with bibasilar streaky atelectasis, right greater than left. Tiny bilateral pleural effusions. Mildly elevated right hemidiaphragm. IMPRESSION: Bibasilar atelectasis with tiny bilateral pleural effusions. Electronically Signed   By: Newell Eke M.D.   On: 02/11/2024 10:09   US  Venous Img Lower Bilateral Result Date: 02/09/2024 CLINICAL DATA:  Leg swelling EXAM: BILATERAL LOWER EXTREMITY VENOUS DOPPLER ULTRASOUND TECHNIQUE: Gray-scale sonography with compression, as well as color and duplex ultrasound, were performed to evaluate the deep venous system(s) from the level of the common femoral vein through the popliteal and proximal calf veins. COMPARISON:  None Available. FINDINGS: VENOUS  Normal compressibility of the common femoral, superficial femoral, and popliteal veins, as well as the visualized calf veins. Visualized portions of profunda femoral vein and great saphenous vein unremarkable. No filling defects to suggest DVT on grayscale or color Doppler imaging. Doppler waveforms show normal direction of venous flow, normal respiratory plasticity and response to augmentation. OTHER Subcutaneous soft tissue edema of bilateral lower legs. Heterogeneously hypoechoic, ovoid structures in the proximal medial thighs without appreciable internal vascularity on color Doppler evaluation measure 4.3 x 2.6 x 1.4 cm on the right and 4.0 x 1.8 x 1.3 cm on the left. Limitations: none IMPRESSION: 1. No evidence of deep venous thrombosis in the bilateral lower extremities. 2. Bilateral lower leg soft tissue edema. 3. Heterogeneously hypoechoic, ovoid structures in the proximal medial thighs without appreciable internal vascularity on color Doppler evaluation measure 4.3 x 2.6 x 1.4 cm on the right and 4.0 x 1.8 x 1.3 cm on the left. These are nonspecific and may represent hematomas, prominent musculature, or other soft tissue mass. Recommend further evaluation with CT or MRI of the thighs. Electronically Signed   By: Limin  Xu M.D.   On: 02/09/2024  12:44   Scheduled Meds:  apixaban   5 mg Oral BID   atorvastatin   80 mg Oral Daily   calcitRIOL  0.25 mcg Oral Daily   insulin  aspart  0-5 Units Subcutaneous QHS   insulin  aspart  0-9 Units Subcutaneous TID WC   ipratropium-albuterol   3 mL Nebulization QID   metoprolol  succinate  12.5 mg Oral QPM   midodrine   2.5 mg Oral BID WC   pantoprazole   80 mg Oral BID   spironolactone   12.5 mg Oral Daily   tamsulosin   0.4 mg Oral QPC breakfast   Continuous Infusions:  furosemide  (LASIX ) 200 mg in dextrose  5 % 100 mL (2 mg/mL) infusion 5 mg/hr (02/11/24 0345)    LOS: 2 days  Time spent: 35 minutes  Dr. Sherre [facility] If 7PM-7AM, please contact  night-coverage 02/11/2024, 10:16 AM

## 2024-02-11 NOTE — Care Management Important Message (Signed)
 Important Message  Patient Details  Name: Jermaine Kramer MRN: [id number] Date of Birth: 03-May-1950   Important Message Given:  Yes - Medicare IM     Rojelio SHAUNNA Rattler 02/11/2024, 3:43 PM

## 2024-02-11 NOTE — Progress Notes (Signed)
 [facility] Cardiology  CARDIOLOGY PROGRESS NOTE  Patient ID: Jermaine Kramer MRN: [id number] DOB/AGE: [birth year] 75 y.o.  Admit date: 02/09/2024 Referring Physician [facility] Primary Physician Perri, ([facility]) Primary Cardiologist Paraschos Reason for Consultation acute on chronic HFrEF  HPI: 73 year old male referred for evaluation of acute on chronic HFrEF.  Patient has a history of HFrEF, atrial fibrillation, and coronary artery disease, status post CABG x 3 (LIMA-LAD, SVG to ramus, SVG to PDA) on 02/16/2021, who presents with 45-month history of worsening shortness of breath, weight gain and peripheral edema.  Patient followed by Ellouise Class FNP at [facility], and has been refractory to outpatient diuretic therapy.  In the emergency room, patient noted to have bilateral peripheral edema, EKG nondiagnostic, chest x-ray revealing pulmonary edema and was treated with dose of IV furosemide .  ECG nondiagnostic.  Patient is status post ICD 06/22/2021.  Patient was seen by nephrology earlier today, and furosemide  IV twice daily was changed to furosemide  drip.  Notable labs include BNP 1871, BUN and creatinine 29 and 2.05, respectively.  Troponin was borderline elevated 27, 21, in the absence of chest pain.  Interval history: -Patient seen and examined this AM, resting in bedside chair.  -States SOB is improving, LE edema improving as well.  -Cr stable today on lasix  gtt.   Review of systems complete and found to be negative unless listed above     Past Medical History:  Diagnosis Date   Arrhythmia    atrial fibrillation   CHF (congestive heart failure) (HCC)    Coronary artery disease    Diabetes mellitus without complication (HCC)    GERD (gastroesophageal reflux disease)    Hypercholesteremia    Hypertension    Sleep apnea    Stroke [facility])    2015 or longer ago    Past Surgical History:  Procedure Laterality Date   CATARACT EXTRACTION Bilateral 04/06/2022   CCM  GENERATOR AND A/V LEAD INSERTION N/A 09/04/2022   Procedure: CCM GENERATOR AND A/V LEAD INSERTION;  Surgeon: Cindie Ole DASEN, MD;  [facility]: [facility] INVASIVE CV LAB;  Service: Cardiovascular;  Laterality: N/A;   CHOLECYSTECTOMY     COLONOSCOPY WITH PROPOFOL  N/A 04/14/2019   Procedure: COLONOSCOPY WITH PROPOFOL ;  Surgeon: Toledo, Ladell POUR, MD;  Location: [facility] ENDOSCOPY;  Service: Gastroenterology;  Laterality: N/A;   CORONARY ARTERY BYPASS GRAFT  02/16/2021   ICD IMPLANT N/A 06/22/2021   Procedure: ICD IMPLANT;  Surgeon: Cindie Ole DASEN, MD;  Location: [facility] INVASIVE CV LAB;  Service: Cardiovascular;  Laterality: N/A;   KNEE ARTHROSCOPY     RENAL CYST EXCISION      Medications Prior to Admission  Medication Sig Dispense Refill Last Dose/Taking   apixaban  (ELIQUIS ) 5 MG TABS tablet Take 1 tablet (5 mg total) by mouth 2 (two) times daily. 180 tablet 3 02/09/2024 Morning   atorvastatin  (LIPITOR ) 80 MG tablet Take 1 tablet (80 mg total) by mouth daily. 30 tablet 5 02/09/2024 Morning   calcitRIOL (ROCALTROL) 0.25 MCG capsule Take 0.25 mcg by mouth.   02/09/2024 Morning   Continuous Glucose Sensor (FREESTYLE LIBRE 2 SENSOR) MISC by Does not apply route.   02/09/2024 Morning   Furosemide  (FUROSCIX ) 80 MG/10ML CTKT Take 80mg  daily as directed. 5 each 0 02/09/2024 Morning   metFORMIN  (GLUCOPHAGE ) 500 MG tablet Take 500 mg by mouth 2 (two) times daily.   02/09/2024 Morning   metolazone  (ZAROXOLYN ) 2.5 MG tablet Take 1 tablet (2.5 mg total) by mouth daily as needed (  worsening edema, SOB). 30 tablet 0 Taking As Needed   metoprolol  succinate (TOPROL -XL) 25 MG 24 hr tablet TAKE 1/2 TABLET BY MOUTH EVERY EVENING 45 tablet 3 02/09/2024 Morning   midodrine  (PROAMATINE ) 2.5 MG tablet Take 1 tablet (2.5 mg total) by mouth 2 (two) times daily with a meal. 60 tablet 5 02/09/2024 Morning   omeprazole (PRILOSEC) 40 MG capsule Take 40 mg by mouth 2 (two) times daily.   02/09/2024 Morning   ondansetron  (ZOFRAN -ODT) 4 MG  disintegrating tablet Take 1 tablet (4 mg total) by mouth every 8 (eight) hours as needed for nausea or vomiting. 12 tablet 0 Taking As Needed   potassium chloride  SA (KLOR-CON  M) 20 MEQ tablet Take 2 tablets (40 mEq total) by mouth as directed. By heart failure clinic 60 tablet 0 02/09/2024 Morning   spironolactone  (ALDACTONE ) 25 MG tablet TAKE 1 TABLET (25 MG TOTAL) BY MOUTH DAILY. (Patient taking differently: Take 12.5 mg by mouth daily.) 90 tablet 3 02/09/2024 Morning   tamsulosin  (FLOMAX ) 0.4 MG CAPS capsule Take 1 capsule (0.4 mg total) by mouth daily after supper. (Patient taking differently: Take 0.4 mg by mouth in the morning and at bedtime.)   02/09/2024 Morning   torsemide  (DEMADEX ) 20 MG tablet Take 2 tablets (40 mg total) by mouth daily. (Patient taking differently: Take 20 mg by mouth daily.) 60 tablet 0 02/09/2024 Morning   Insulin  Pen Needle (COMFORT EZ PEN NEEDLES) 32G X 8 MM MISC by Other route.      Social History   Socioeconomic History   Marital status: Married    Spouse name: Not on file   Number of children: Not on file   Years of education: Not on file   Highest education level: Not on file  Occupational History   Not on file  Tobacco Use   Smoking status: Never   Smokeless tobacco: Never  Vaping Use   Vaping status: Never Used  Substance and Sexual Activity   Alcohol use: Not Currently   Drug use: No   Sexual activity: Not Currently  Other Topics Concern   Not on file  Social History Narrative   Not on file   Social Drivers of Health   Financial Resource Strain: Low Risk  (10/24/2023)   Received from [facility]   Overall Financial Resource Strain (CARDIA)    Difficulty of Paying Living Expenses: Not hard at all  Food Insecurity: No Food Insecurity (02/10/2024)   Hunger Vital Sign    Worried About Running Out of Food in the Last Year: Never true    Ran Out of Food in the Last Year: Never true  Transportation Needs: No Transportation  Needs (02/10/2024)   PRAPARE - Administrator, Civil Service (Medical): No    Lack of Transportation (Non-Medical): No  Physical Activity: Not on file  Stress: Not on file  Social Connections: Moderately Integrated (02/10/2024)   Social Connection and Isolation Panel    Frequency of Communication with Friends and Family: More than three times a week    Frequency of Social Gatherings with Friends and Family: More than three times a week    Attends Religious Services: More than 4 times per year    Active Member of [employer] or Organizations: No    Attends Banker Meetings: Never    Marital Status: Married  Catering manager Violence: Not At Risk (02/10/2024)   Humiliation, Afraid, Rape, and Kick questionnaire    Fear of Current or  Ex-Partner: No    Emotionally Abused: No    Physically Abused: No    Sexually Abused: No    Family History  Problem Relation Age of Onset   Parkinson's disease Mother    Lupus Mother    Heart disease Father      Review of systems complete and found to be negative unless listed above    PHYSICAL EXAM General: Well developed, well nourished, in no acute distress HEENT:  Normocephalic and atramatic Neck:  No JVD.  Lungs: Clear bilaterally to auscultation and percussion. Heart: HRRR . Normal S1 and S2 without gallops or murmurs.  Abdomen: Bowel sounds are positive, abdomen soft and non-tender  Msk:  Back normal, normal gait. Normal strength and tone for age. Extremities: No clubbing, cyanosis or edema.   Neuro: Alert and oriented X 3. Psych:  Good affect, responds appropriately  Labs:   Lab Results  Component Value Date   WBC 5.1 02/11/2024   HGB 11.2 (L) 02/11/2024   HCT 35.7 (L) 02/11/2024   MCV 83.0 02/11/2024   PLT 152 02/11/2024    Recent Labs  Lab 02/10/24 0315 02/11/24 0419  NA 138 138  K 3.9 4.0  CL 105 101  CO2 25 26  BUN 29* 30*  CREATININE 2.05* 2.01*  CALCIUM  8.8* 8.8*  PROT 5.9*  --   BILITOT 0.8  --    ALKPHOS 56  --   ALT 9  --   AST 13*  --   GLUCOSE 143* 155*   Lab Results  Component Value Date   CKTOTAL 21 (L) 06/09/2022   TROPONINI < 0.02 10/19/2013   No results found for: CHOL No results found for: HDL No results found for: LDLCALC No results found for: TRIG No results found for: CHOLHDL No results found for: LDLDIRECT    Radiology: US  Venous Img Lower Bilateral Result Date: 02/09/2024 CLINICAL DATA:  Leg swelling EXAM: BILATERAL LOWER EXTREMITY VENOUS DOPPLER ULTRASOUND TECHNIQUE: Gray-scale sonography with compression, as well as color and duplex ultrasound, were performed to evaluate the deep venous system(s) from the level of the common femoral vein through the popliteal and proximal calf veins. COMPARISON:  None Available. FINDINGS: VENOUS Normal compressibility of the common femoral, superficial femoral, and popliteal veins, as well as the visualized calf veins. Visualized portions of profunda femoral vein and great saphenous vein unremarkable. No filling defects to suggest DVT on grayscale or color Doppler imaging. Doppler waveforms show normal direction of venous flow, normal respiratory plasticity and response to augmentation. OTHER Subcutaneous soft tissue edema of bilateral lower legs. Heterogeneously hypoechoic, ovoid structures in the proximal medial thighs without appreciable internal vascularity on color Doppler evaluation measure 4.3 x 2.6 x 1.4 cm on the right and 4.0 x 1.8 x 1.3 cm on the left. Limitations: none IMPRESSION: 1. No evidence of deep venous thrombosis in the bilateral lower extremities. 2. Bilateral lower leg soft tissue edema. 3. Heterogeneously hypoechoic, ovoid structures in the proximal medial thighs without appreciable internal vascularity on color Doppler evaluation measure 4.3 x 2.6 x 1.4 cm on the right and 4.0 x 1.8 x 1.3 cm on the left. These are nonspecific and may represent hematomas, prominent musculature, or other soft tissue mass.  Recommend further evaluation with CT or MRI of the thighs. Electronically Signed   By: Limin  Xu M.D.   On: 02/09/2024 12:44   DG Chest Portable 1 View Result Date: 02/09/2024 EXAM: 1 VIEW(S) XRAY OF THE CHEST 02/09/2024 09:24:00 AM COMPARISON: 01/19/2024  CLINICAL HISTORY: sob. Pt c/o SOB for the past couple of months, that has gotten worse the past week or so. States he started taken 80mg  of Lasix  on Thursday once a day and it has not helped. Report some abd distention. Also c/o of tightness in his chest and some cough and ; congestion. Pt has a hx of CHF. Pt is A\T\Ox4, pt labored and tachypneic. FINDINGS: LINES, TUBES AND DEVICES: Stable left AICD with 2 leads. Stable right AICD with 2 leads. LUNGS AND PLEURA: Mild pulmonary edema. Bibasilar atelectasis. Small bilateral pleural effusions. No pneumothorax. HEART AND MEDIASTINUM: Cardiomegaly. CABG markers noted. BONES AND SOFT TISSUES: Sternotomy wires noted. No acute osseous abnormality. IMPRESSION: 1. Mild pulmonary edema with bibasilar atelectasis 2. Small bilateral pleural effusions Electronically signed by: Waddell Calk MD 02/09/2024 09:53 AM EDT RP Workstation: [id number]   US  ASCITES (ABDOMEN LIMITED) Result Date: 01/19/2024 CLINICAL DATA:  Congestive heart failure.  Previous cholecystectomy. EXAM: LIMITED ABDOMEN ULTRASOUND FOR ASCITES TECHNIQUE: Limited ultrasound survey for ascites was performed in all four abdominal quadrants. COMPARISON:  CT abdomen 12/22/2022 FINDINGS: Minimal ascites over the right upper and right lower quadrants with trace free fluid adjacent the spleen in the left upper quadrant. No significant free fluid in the left lower quadrant this free fluid is new since the previous CT of August 2024. IMPRESSION: Minimal ascites as described. Electronically Signed   By: Toribio Agreste M.D.   On: 01/19/2024 15:02   DG Chest 2 View Result Date: 01/19/2024 CLINICAL DATA:  73 year old male with shortness of breath and unintentional  weight gain. Chest tightness. EXAM: CHEST - 2 VIEW COMPARISON:  Chest radiographs 09/04/2022 and earlier. FINDINGS: PA and lateral views 0732 hours. Bilateral cardiac pacemaker/ICD are stable. Prior CABG. Mild cardiomegaly. Stable to mildly improved lung volumes from last year. No pneumothorax. No pulmonary edema. No convincing pleural effusion or acute lung opacity. Streaky mild infrahilar opacity appears to be chronic, stable since 2023. Hyperostosis related thoracic spinal ankylosis. No acute osseous abnormality identified. Small surgical clips in the visible upper abdomen. Nonobstructed bowel-gas pattern. IMPRESSION: 1. No acute cardiopulmonary abnormality. 2. Chronic cardiac pacemakers/ICD. Stable mild cardiomegaly. Prior CABG. Electronically Signed   By: VEAR Hurst M.D.   On: 01/19/2024 07:52    EKG: Sinus rhythm with right bundle branch block at 94 bpm  Tele reviewed by me: Sinus rhythm LBBB rate 90s  ASSESSMENT AND PLAN:   1.  Acute on chronic HFrEF, LVEF 20-25% 06/28/2022, with 43-month history of worsening exertional dyspnea, peripheral edema, refractory to outpatient diuretic therapy.  BNP 1871.  Patient initially treated with IV furosemide , now on furosemide  drip per nephrology. 2.  CAD, status post CABG x 3 (LIMA-LAD, SVG-ramus, SVG-PDA 02/16/2021) 3.  Borderline elevated troponin, likely demand supply ischemia, in the absence of chest pain, in the setting of acute on chronic HFrEF 4.  CKD stage IV, been seen by nephrology who changed IV furosemide  to furosemide  gtt.  5.  Paroxysmal atrial fibrillation, rate controlled, on Eliquis  for stroke prevention 6.  Status post ICD  Recommendations  1.  Agree with current therapy 2.  Continue diuresis. Appreciate nephro input. 3.  Carefully monitor renal status with diuresis 4.  Continue Eliquis  for stroke prevention 5.  Defer further cardiac diagnostics at this time  This patient's plan of care was discussed and created with Dr. Florencio and  he is in agreement.    Signed: Danita Bloch, PA-C  02/11/2024, 9:43 AM [facility] Card[facility]

## 2024-02-11 NOTE — Progress Notes (Addendum)
 Heart Failure Navigator Progress Note  Assessed for Heart & Vascular TOC clinic readiness.  Patient is currently followed by the AHF clinic, Ellouise Class, FNP.  Hospital Follow-Up scheduled for 02/18/24 @ 12:00.   Navigator will sign off at this time.  Charmaine Pines, RN, BSN [facility] Heart Failure Navigator Secure Chat Only

## 2024-02-11 NOTE — Progress Notes (Signed)
 [facility]  ROUNDING NOTE   Subjective:   Patient well-known to us  from the office.  He has known past medical history of atrial fibrillation, hyperlipidemia, diabetes mellitus type 2, chronic systolic heart failure ejection fraction 30 to 35%, obstructive sleep apnea, hypertension, history of CVA, GERD, coronary disease status post CABG, chronic kidney disease stage IIIb who presented with increasing shortness of breath and acute exacerbation of chronic systolic heart failure.   Update: Patient seen sitting up in bed Alert and oriented Denies shortness of breath  Creatinine 2.01  Objective:  Vital signs in last 24 hours:  Temp:  [97.7 F (36.5 C)-98.7 F (37.1 C)] 97.7 F (36.5 C) (10/13 0754) Pulse Rate:  [80-91] 86 (10/13 0754) Resp:  [17-19] 18 (10/13 0754) BP: (94-115)/(64-80) 98/71 (10/13 0754) SpO2:  [92 %-99 %] 97 % (10/13 0754) Weight:  [82 kg] 82 kg (10/13 0500)  Weight change: -2.823 kg Filed Weights   02/09/24 0908 02/10/24 0527 02/11/24 0500  Weight: 84.8 kg 83.1 kg 82 kg    Intake/Output: I/O last 3 completed shifts: In: 155.9 [P.O.:120; I.V.:35.9] Out: 1350 [Urine:1350]   Intake/Output this shift:  Total I/O In: 141.1 [P.O.:120; I.V.:21.1] Out: 300 [Urine:300]  Physical Exam: General: No acute distress  Head: Normocephalic, atraumatic. Moist oral mucosal membranes  Neck: Supple  Lungs:  Faint rales  Heart: S1S2 no rubs  Abdomen:  Soft, nontender, bowel sounds present  Extremities: 1+ peripheral edema.  Neurologic: Awake, alert, following commands  Skin: No acute rash  Access: No hemodialysis access    Basic Metabolic Panel: Recent Labs  Lab 02/09/24 0921 02/10/24 0315 02/10/24 1041 02/11/24 0419  NA 138 138  --  138  K 4.3 3.9  --  4.0  CL 102 105  --  101  CO2 26 25  --  26  GLUCOSE 241* 143*  --  155*  BUN 31* 29*  --  30*  CREATININE 2.33* 2.05*  --  2.01*  CALCIUM  9.0 8.8*  --  8.8*  MG  --   --  1.5* 1.9     Liver Function Tests: Recent Labs  Lab 02/10/24 0315  AST 13*  ALT 9  ALKPHOS 56  BILITOT 0.8  PROT 5.9*  ALBUMIN 3.3*   No results for input(s): LIPASE, AMYLASE in the last 168 hours. No results for input(s): AMMONIA in the last 168 hours.  CBC: Recent Labs  Lab 02/09/24 0921 02/10/24 0315 02/11/24 0419  WBC 5.0 5.1 5.1  HGB 11.8* 11.0* 11.2*  HCT 38.3* 34.2* 35.7*  MCV 84.7 82.4 83.0  PLT 155 146* 152    Cardiac Enzymes: No results for input(s): CKTOTAL, CKMB, CKMBINDEX, TROPONINI in the last 168 hours.  BNP: Invalid input(s): POCBNP  CBG: Recent Labs  Lab 02/10/24 0854 02/10/24 1237 02/10/24 1650 02/10/24 2024 02/11/24 0751  GLUCAP 230* 192* 160* 168* 180*    Microbiology: Results for orders placed or performed during the hospital encounter of 01/19/24  Resp panel by RT-PCR (RSV, Flu A&B, Covid) Anterior Nasal Swab     Status: None   Collection Time: 01/19/24  7:22 AM   Specimen: Anterior Nasal Swab  Result Value Ref Range Status   SARS Coronavirus 2 by RT PCR NEGATIVE NEGATIVE Final    Comment: (NOTE) SARS-CoV-2 target nucleic acids are NOT DETECTED.  The SARS-CoV-2 RNA is generally detectable in upper respiratory specimens during the acute phase of infection. The lowest concentration of SARS-CoV-2 viral copies this assay can detect is  138 copies/mL. A negative result does not preclude SARS-Cov-2 infection and should not be used as the sole basis for treatment or other patient management decisions. A negative result may occur with  improper specimen collection/handling, submission of specimen other than nasopharyngeal swab, presence of viral mutation(s) within the areas targeted by this assay, and inadequate number of viral copies(<138 copies/mL). A negative result must be combined with clinical observations, patient history, and epidemiological information. The expected result is Negative.  Fact Sheet for Patients:   BloggerCourse.com  Fact Sheet for Healthcare Providers:  SeriousBroker.it  This test is no t yet approved or cleared by the [place]  FDA and  has been authorized for detection and/or diagnosis of SARS-CoV-2 by FDA under an Emergency Use Authorization (EUA). This EUA will remain  in effect (meaning this test can be used) for the duration of the COVID-19 declaration under Section 564(b)(1) of the Act, 21 U.S.C.section 360bbb-3(b)(1), unless the authorization is terminated  or revoked sooner.       Influenza A by PCR NEGATIVE NEGATIVE Final   Influenza B by PCR NEGATIVE NEGATIVE Final    Comment: (NOTE) The Xpert Xpress SARS-CoV-2/FLU/RSV plus assay is intended as an aid in the diagnosis of influenza from Nasopharyngeal swab specimens and should not be used as a sole basis for treatment. Nasal washings and aspirates are unacceptable for Xpert Xpress SARS-CoV-2/FLU/RSV testing.  Fact Sheet for Patients: BloggerCourse.com  Fact Sheet for Healthcare Providers: SeriousBroker.it  This test is not yet approved or cleared by the [place]  FDA and has been authorized for detection and/or diagnosis of SARS-CoV-2 by FDA under an Emergency Use Authorization (EUA). This EUA will remain in effect (meaning this test can be used) for the duration of the COVID-19 declaration under Section 564(b)(1) of the Act, 21 U.S.C. section 360bbb-3(b)(1), unless the authorization is terminated or revoked.     Resp Syncytial Virus by PCR NEGATIVE NEGATIVE Final    Comment: (NOTE) Fact Sheet for Patients: BloggerCourse.com  Fact Sheet for Healthcare Providers: SeriousBroker.it  This test is not yet approved or cleared by the [place]  FDA and has been authorized for detection and/or diagnosis of SARS-CoV-2 by FDA under an Emergency Use  Authorization (EUA). This EUA will remain in effect (meaning this test can be used) for the duration of the COVID-19 declaration under Section 564(b)(1) of the Act, 21 U.S.C. section 360bbb-3(b)(1), unless the authorization is terminated or revoked.  Performed at [facility], [address] [place], [place] [address]     Coagulation Studies: Recent Labs    02/10/24 0315  LABPROT 20.3*  INR 1.6*    Urinalysis: No results for input(s): COLORURINE, LABSPEC, PHURINE, GLUCOSEU, HGBUR, BILIRUBINUR, KETONESUR, PROTEINUR, UROBILINOGEN, NITRITE, LEUKOCYTESUR in the last 72 hours.  Invalid input(s): APPERANCEUR    Imaging: DG Chest 2 View Result Date: 02/11/2024 CLINICAL DATA:  Shortness of breath. EXAM: CHEST - 2 VIEW COMPARISON:  02/09/2024 and CT chest 07/24/2021. FINDINGS: Trachea is midline. Heart is enlarged, stable. Pacemaker and ICD lead tips are stable in position. Lungs are somewhat low in volume with bibasilar streaky atelectasis, right greater than left. Tiny bilateral pleural effusions. Mildly elevated right hemidiaphragm. IMPRESSION: Bibasilar atelectasis with tiny bilateral pleural effusions. Electronically Signed   By: Newell Eke M.D.   On: 02/11/2024 10:09     Medications:    furosemide  (LASIX ) 200 mg in dextrose  5 % 100 mL (2 mg/mL) infusion 5 mg/hr (02/11/24 1219)    apixaban   5 mg Oral BID  atorvastatin   80 mg Oral Daily   calcitRIOL  0.25 mcg Oral Daily   insulin  aspart  0-5 Units Subcutaneous QHS   insulin  aspart  0-9 Units Subcutaneous TID WC   ipratropium-albuterol   3 mL Nebulization QID   metoprolol  succinate  12.5 mg Oral QPM   midodrine   2.5 mg Oral BID WC   pantoprazole   80 mg Oral BID   spironolactone   12.5 mg Oral Daily   tamsulosin   0.4 mg Oral QPC breakfast   acetaminophen  **OR** acetaminophen , hydrALAZINE , ipratropium-albuterol , melatonin, ondansetron  **OR** ondansetron  (ZOFRAN ) IV, oxyCODONE ,  polyethylene glycol  Assessment/ Plan:  73 y.o. male with past medical history of atrial fibrillation, hyperlipidemia, diabetes mellitus type 2 with chronic kidney disease, chronic systolic heart failure ejection fraction 20 to 25%, obstructive sleep apnea, hypertension, history of CVA, GERD, coronary disease status post CABG who returns now with shortness of breath and acute exacerbation of chronic systolic heart failure.  1.  Acute kidney injury/chronic kidney disease stage IIIb likely secondary to cardiorenal syndrome.  Recent baseline creatinine appears to be 1.9.    Creatinine has stabilized at baseline  2.  Acute on chronic systolic heart failure exacerbation.  Ejection fraction 20 to 25% and quite low at baseline.  Transferred to Lasix  drip at 5 mg/h and titrate as necessary.  3.  Secondary hyperparathyroidism.  Maintain the patient Calcitrol 0.25 mcg p.o. daily.  Calcium  within optimal range.    LOS: 2 [place] 10/13/202512:57 PM

## 2024-02-11 NOTE — Plan of Care (Signed)
  Problem: Education: Goal: Ability to demonstrate management of disease process will improve Outcome: Progressing Goal: Ability to verbalize understanding of medication therapies will improve Outcome: Progressing Goal: Individualized Educational Video(s) Outcome: Progressing   Problem: Activity: Goal: Capacity to carry out activities will improve Outcome: Progressing   Problem: Cardiac: Goal: Ability to achieve and maintain adequate cardiopulmonary perfusion will improve Outcome: Progressing   Problem: Education: Goal: Knowledge of General Education information will improve Description: Including pain rating scale, medication(s)/side effects and non-pharmacologic comfort measures Outcome: Progressing   Problem: Health Behavior/Discharge Planning: Goal: Ability to manage health-related needs will improve Outcome: Progressing   Problem: Clinical Measurements: Goal: Ability to maintain clinical measurements within normal limits will improve Outcome: Progressing Goal: Will remain free from infection Outcome: Progressing Goal: Diagnostic test results will improve Outcome: Progressing Goal: Respiratory complications will improve Outcome: Progressing Goal: Cardiovascular complication will be avoided Outcome: Progressing   Problem: Activity: Goal: Risk for activity intolerance will decrease Outcome: Progressing   Problem: Nutrition: Goal: Adequate nutrition will be maintained Outcome: Progressing   Problem: Coping: Goal: Level of anxiety will decrease Outcome: Progressing   Problem: Elimination: Goal: Will not experience complications related to bowel motility Outcome: Progressing Goal: Will not experience complications related to urinary retention Outcome: Progressing   Problem: Pain Managment: Goal: General experience of comfort will improve and/or be controlled Outcome: Progressing   Problem: Safety: Goal: Ability to remain free from injury will  improve Outcome: Progressing   Problem: Skin Integrity: Goal: Risk for impaired skin integrity will decrease Outcome: Progressing   Problem: Education: Goal: Ability to describe self-care measures that may prevent or decrease complications (Diabetes Survival Skills Education) will improve Outcome: Progressing Goal: Individualized Educational Video(s) Outcome: Progressing   Problem: Coping: Goal: Ability to adjust to condition or change in health will improve Outcome: Progressing   Problem: Fluid Volume: Goal: Ability to maintain a balanced intake and output will improve Outcome: Progressing   Problem: Health Behavior/Discharge Planning: Goal: Ability to identify and utilize available resources and services will improve Outcome: Progressing Goal: Ability to manage health-related needs will improve Outcome: Progressing   Problem: Metabolic: Goal: Ability to maintain appropriate glucose levels will improve Outcome: Progressing   Problem: Nutritional: Goal: Maintenance of adequate nutrition will improve Outcome: Progressing Goal: Progress toward achieving an optimal weight will improve Outcome: Progressing   Problem: Skin Integrity: Goal: Risk for impaired skin integrity will decrease Outcome: Progressing   Problem: Tissue Perfusion: Goal: Adequacy of tissue perfusion will improve Outcome: Progressing

## 2024-02-11 NOTE — Progress Notes (Addendum)
 Heart Failure Stewardship Pharmacy Note  PCP: Perri Constance Sor, PA-C PCP-Cardiologist: None  HPI: Jermaine Kramer is a 73 y.o. male with CAD, chronic HFrEF, T2DM, HTN, HLD, CKD stage IIIb, BPH, PPM  who presented with shortness of breath and peripheral edema. On admission, BNP was 1871.3, HS-troponin was 27, and d-dimer 4.93. Chest x-ray noted mild pulmonary edema and small bilateral pleural effusions.   Pertinent cardiac history: Underwent CABG 2022. TTE 05/2021 with LVEF <20%, G1DD. ICD placed 06/2021. TTE 08/2021 with LVEF 30-35% and G3DD. TTE 06/2022 with LVEF 20-25%, G1DD, mild RV dysfunction. Gen change and A/V lead insertion in 08/2022.  Pertinent Lab Values: Creatinine  Date Value Ref Range Status  10/23/2013 0.95 0.60 - 1.30 mg/dL Final   Creatinine, Ser  Date Value Ref Range Status  02/11/2024 2.01 (H) 0.61 - 1.24 mg/dL Final   BUN  Date Value Ref Range Status  02/11/2024 30 (H) 8 - 23 mg/dL Final  89/96/7974 25 8 - 27 mg/dL Final  93/74/7984 14 7 - 18 mg/dL Final   Potassium  Date Value Ref Range Status  02/11/2024 4.0 3.5 - 5.1 mmol/L Final  10/23/2013 4.0 3.5 - 5.1 mmol/L Final   Sodium  Date Value Ref Range Status  02/11/2024 138 135 - 145 mmol/L Final  02/01/2024 138 134 - 144 mmol/L Final  10/23/2013 139 136 - 145 mmol/L Final   B Natriuretic Peptide  Date Value Ref Range Status  02/09/2024 1,871.3 (H) 0.0 - 100.0 pg/mL Final    Comment:    Performed at [facility], [address] [place], [place] [address]   Magnesium   Date Value Ref Range Status  02/11/2024 1.9 1.7 - 2.4 mg/dL Final    Comment:    Performed at [facility], [address] [place], [place] [address]   Hgb A1c MFr Bld  Date Value Ref Range Status  01/19/2024 7.1 (H) 4.8 - 5.6 % Final    Comment:    (NOTE) Diagnosis of Diabetes The following HbA1c ranges recommended by the American Diabetes Association (ADA) may be used as an aid in the diagnosis of  diabetes mellitus.  Hemoglobin             Suggested A1C NGSP%              Diagnosis  <5.7                   Non Diabetic  5.7-6.4                Pre-Diabetic  >6.4                   Diabetic  <7.0                   Glycemic control for                       adults with diabetes.      Vital Signs:  Temp:  [97.6 F (36.4 C)-98.7 F (37.1 C)] 97.9 F (36.6 C) (10/13 0358) Pulse Rate:  [80-91] 80 (10/13 0358) Cardiac Rhythm: Normal sinus rhythm;Bundle branch block (10/12 1900) Resp:  [17-19] 17 (10/13 0358) BP: (94-115)/(64-80) 97/69 (10/13 0358) SpO2:  [92 %-99 %] 95 % (10/13 0735) Weight:  [82 kg (180 lb 12.4 oz)] 82 kg (180 lb 12.4 oz) (10/13 0500)  Intake/Output Summary (Last 24 hours) at 02/11/2024 0751 Last data filed at 02/11/2024 9251 Gross  per 24 hour  Intake 35.85 ml  Output 1150 ml  Net -1114.15 ml    Current Heart Failure Medications:  Loop diuretic: furosemide  5 mg/hr Beta-Blocker: metoprolol  succinate 12.5 mg daily ACEI/ARB/ARNI: none MRA: spironolactone  12.5 mg daily SGLT2i: none Other: midodrine  2.5 mg TID  Prior to admission Heart Failure Medications:  Loop diuretic: torsemide  40 mg daily Beta-Blocker: metoprolol  succinate 12.5 mg daily ACEI/ARB/ARNI: losartan  12.5 mg daily MRA: spironolactone  25 mg daily SGLT2i: none Other: none  Assessment: 1. 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 NYHA class II-III symptoms.  -Symptoms: Reports feeling fine for several days after discharge, but experienced progressive shortness of breath unresolved with outpatient Furoscix . Still short of breath today, though this is improved with nebs. -Volume: Appears hypervolemic. Currently on low dose Lasix  gtt with modest urine output documented. Would likely do better on higher dose bolus than lower dose gtt. Consider increasing diuretics.  -Hemodynamics: BP soft, but asymptomatic.  -BB:  Continue metoprolol  succinate 12.5 mg daily -ACEI/ARB/ARNI: Home losartan  12.5 mg daily on hold -MRA: Continue spironolactone  25 mg daily for now.  -SGLT2i: No SGLT2i given hx of penile cancer and UTI. -Currently on midodrine  2.5 mg TID. Would aim to wean this when able. Would not reintroduce afterload reduction until BP stable off midodrine .  Plan: 1) Medication changes recommended at this time: -Consider doubling home dose of diuretics  2) Patient assistance: -Pending  3) Education: - Patient has been educated on current HF medications and potential additions to HF medication regimen - Patient verbalizes understanding that over the next few months, these medication doses may change and more medications may be added to optimize HF regimen - Patient has been educated on basic disease state pathophysiology and goals of therapy  Medication Assistance / Insurance Benefits Check: Does the patient have prescription insurance?    Please do not hesitate to reach out with questions or concerns,  Jaun Bash, PharmD, CPP, BCPS, [facility] Heart Failure Pharmacist  Phone - [phone] 02/11/2024 9:39 AM

## 2024-02-12 DIAGNOSIS — I5023 Acute on chronic systolic (congestive) heart failure: Secondary | ICD-10-CM | POA: Diagnosis not present

## 2024-02-12 DIAGNOSIS — E119 Type 2 diabetes mellitus without complications: Secondary | ICD-10-CM

## 2024-02-12 LAB — BASIC METABOLIC PANEL WITH GFR
Anion gap: 10 (ref 5–15)
BUN: 29 mg/dL — ABNORMAL HIGH (ref 8–23)
CO2: 27 mmol/L (ref 22–32)
Calcium: 8.9 mg/dL (ref 8.9–10.3)
Chloride: 100 mmol/L (ref 98–111)
Creatinine, Ser: 2.05 mg/dL — ABNORMAL HIGH (ref 0.61–1.24)
GFR, Estimated: 33 mL/min — ABNORMAL LOW (ref >60)
Glucose, Bld: 187 mg/dL — ABNORMAL HIGH (ref 70–99)
Potassium: 4 mmol/L (ref 3.5–5.1)
Sodium: 137 mmol/L (ref 135–145)

## 2024-02-12 LAB — CBC
HCT: 37.1 % — ABNORMAL LOW (ref 39.0–52.0)
Hemoglobin: 11.4 g/dL — ABNORMAL LOW (ref 13.0–17.0)
MCH: 25.8 pg — ABNORMAL LOW (ref 26.0–34.0)
MCHC: 30.7 g/dL (ref 30.0–36.0)
MCV: 83.9 fL (ref 80.0–100.0)
Platelets: 137 K/uL — ABNORMAL LOW (ref 150–400)
RBC: 4.42 MIL/uL (ref 4.22–5.81)
RDW: 14.5 % (ref 11.5–15.5)
WBC: 5.5 K/uL (ref 4.0–10.5)
nRBC: 0 % (ref 0.0–0.2)

## 2024-02-12 LAB — GLUCOSE, CAPILLARY
Glucose-Capillary: 173 mg/dL — ABNORMAL HIGH (ref 70–99)
Glucose-Capillary: 160 mg/dL — ABNORMAL HIGH (ref 70–99)
Glucose-Capillary: 230 mg/dL — ABNORMAL HIGH (ref 70–99)
Glucose-Capillary: 163 mg/dL — ABNORMAL HIGH (ref 70–99)
Glucose-Capillary: 155 mg/dL — ABNORMAL HIGH (ref 70–99)

## 2024-02-12 MED ORDER — TORSEMIDE 20 MG PO TABS
40.0000 mg | ORAL_TABLET | Freq: Every day | ORAL | Status: DC
  Administered 2024-02-12 – 2024-02-13 (×2): 40 mg via ORAL
  Filled 2024-02-12 (×2): qty 2

## 2024-02-12 NOTE — TOC Initial Note (Signed)
 Transition of Care [facility]) - Initial/Assessment Note    Patient Details  Name: Jermaine Kramer MRN: [id number] Date of Birth: 12/11/1950  [facility] CM/SW Contact:    Jermaine JAYSON Carpen, LCSW Phone Number: 02/12/2024, 12:58 PM  Clinical Narrative: Readmission prevention screen complete. CSW met with patient. No family at bedside. CSW introduced role and explained that discharge planning would be discussed. PCP is Jermaine Perri, PA-C. Wife drives him to appointments. [facility] [place]. No issues obtaining medications. Patient lives home with his wife, daughter, and grandchild. No home health or DME use prior to admission. No further concerns. CSW will continue to follow patient for support and facilitate return home once stable. Wife will drive him home at discharge.                 Expected Discharge Plan: Home/Self Care Barriers to Discharge: Continued Medical Work up   Patient Goals and CMS Choice            Expected Discharge Plan and Services     Post Acute Care Choice: NA Living arrangements for the past 2 months: Mobile Home                                      Prior Living Arrangements/Services Living arrangements for the past 2 months: Mobile Home Lives with:: Adult Children, Spouse, Relatives Patient language and need for interpreter reviewed:: Yes Do you feel safe going back to the place where you live?: Yes      Need for Family Participation in Patient Care: Yes (Comment) Care giver support system in place?: Yes (comment)   Criminal Activity/Legal Involvement Pertinent to Current Situation/Hospitalization: No - Comment as needed  Activities of Daily Living   ADL Screening (condition at time of admission) Independently performs ADLs?: Yes (appropriate for developmental age) Is the patient deaf or have difficulty hearing?: No Does the patient have difficulty seeing, even when wearing glasses/contacts?: No Does the patient have difficulty  concentrating, remembering, or making decisions?: No  Permission Sought/Granted                  Emotional Assessment Appearance:: Appears stated age Attitude/Demeanor/Rapport: Engaged, Gracious Affect (typically observed): Accepting, Appropriate, Calm, Pleasant Orientation: : Oriented to Self, Oriented to Place, Oriented to  Time, Oriented to Situation Alcohol / Substance Use: Not Applicable Psych Involvement: No (comment)  Admission diagnosis:  CHF (congestive heart failure) (HCC) [I50.9] Leg swelling [M79.89] Positive D dimer [R79.89] Acute on chronic congestive heart failure, unspecified heart failure type (HCC) [I50.9] Patient Active Problem List   Diagnosis Date Noted   DM2 (diabetes mellitus, type 2) (HCC) 02/12/2024   CHF (congestive heart failure) (HCC) 01/19/2024   UTI (urinary tract infection) 06/09/2022   Abdominal pain 06/09/2022   Elevated lactic acid level 06/09/2022   Gastroenteritis and colitis, viral 10/05/2021   Acute gastroenteritis, rotavirus 10/04/2021   AKI (acute kidney injury) with metabolic acidosis(HCC) 10/04/2021   Diabetes mellitus without complication (HCC)    Hypokalemia    Hypotension    Dental caries    Acute pulmonary edema (HCC)    Left flank pain 07/24/2021   Stroke (HCC) 07/24/2021   Chronic combined systolic and diastolic congestive heart failure (HCC) 07/24/2021   Atrial fibrillation, chronic (HCC) 07/24/2021   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   Kidney stone on left side 07/24/2021   HFrEF (heart failure with  reduced ejection fraction) (HCC) 06/22/2021   Ischemic cardiomyopathy 06/22/2021   CAD s/p CABG x 3 06/22/2021   Hyperlipidemia LDL goal <70 06/22/2021   Acute CHF (congestive heart failure) (HCC) 05/14/2021   Incomplete emptying of bladder 05/04/2021   Acute respiratory failure with hypoxia (HCC)    Troponin I above reference range    COVID 04/21/2021   Postoperative atrial fibrillation (HCC) 04/21/2021   GERD  (gastroesophageal reflux disease)    Hypertension    Acute parotitis 02/27/2021   Volume overload 02/19/2021   Thrombocytopenia 02/18/2021   Receiving inotropic medication 02/18/2021   Postoperative pain 02/18/2021   On enteral nutrition 02/18/2021   Postoperative anemia due to acute blood loss 02/18/2021   Altered mental status 02/18/2021   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   Bradycardia 12/10/2020   Epididymo-orchitis 11/30/2020   Urinary retention 04/09/2020   Obesity (BMI 30.0-34.9) 05/14/2017   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 [place] [address] Phone: [phone] Fax: [phone]  [id number] [place], [place] - [address] [place] [address] Phone: [phone] Fax: [phone]  [facility] [address] [place] [address] Phone: [phone] Fax: [phone]  Jermaine Kramer - [facility] [address] [place] [address] Phone: [phone] Fax: [phone]     Social Drivers of Health (SDOH) Social History: SDOH Screenings   Food Insecurity: No Food Insecurity (02/10/2024)  Housing: Unknown (02/10/2024)  Transportation Needs: No Transportation Needs (02/10/2024)  Utilities: Not At Risk (02/10/2024)  Depression (PHQ2-9): Low Risk  (01/31/2022)  Financial Resource Strain: Low Risk  (10/24/2023)   Received from [facility]  Social  Connections: Moderately Integrated (02/10/2024)  Tobacco Use: Low Risk  (02/10/2024)  Recent Concern: Tobacco Use - Medium Risk (01/30/2024)   Received from [facility]   SDOH Interventions:     Readmission Risk Interventions    02/12/2024   12:56 PM 06/11/2022   11:05 AM 07/27/2021   12:31 PM  Readmission Risk Prevention Plan  Transportation Screening Complete Complete Complete  PCP or Specialist Appt within 3-5 Days Complete  Complete  Social Work Consult for Recovery Care Planning/Counseling Complete  Complete  Palliative Care Screening Not Applicable  Not Applicable  Medication Review Oceanographer) Complete Complete Complete  PCP or Specialist appointment within 3-5 days of discharge  Complete   SW Recovery Care/Counseling Consult  Complete   Palliative Care Screening  Not Applicable   Skilled Nursing Facility  Not Applicable

## 2024-02-12 NOTE — Progress Notes (Addendum)
 Heart Failure Stewardship Pharmacy Note  PCP: Perri Constance Sor, PA-C PCP-Cardiologist: None  HPI: Jermaine Kramer is a 73 y.o. male with CAD, chronic HFrEF, T2DM, HTN, HLD, CKD stage IIIb, BPH, PPM  who presented with shortness of breath and peripheral edema. On admission, BNP was 1871.3, HS-troponin was 27, and d-dimer 4.93. Chest x-ray noted mild pulmonary edema and small bilateral pleural effusions.   Pertinent cardiac history: Underwent CABG 2022. TTE 05/2021 with LVEF <20%, G1DD. ICD placed 06/2021. TTE 08/2021 with LVEF 30-35% and G3DD. TTE 06/2022 with LVEF 20-25%, G1DD, mild RV dysfunction. Gen change and A/V lead insertion in 08/2022.  Pertinent Lab Values: Creatinine  Date Value Ref Range Status  10/23/2013 0.95 0.60 - 1.30 mg/dL Final   Creatinine, Ser  Date Value Ref Range Status  02/12/2024 2.05 (H) 0.61 - 1.24 mg/dL Final   BUN  Date Value Ref Range Status  02/12/2024 29 (H) 8 - 23 mg/dL Final  89/96/7974 25 8 - 27 mg/dL Final  93/74/7984 14 7 - 18 mg/dL Final   Potassium  Date Value Ref Range Status  02/12/2024 4.0 3.5 - 5.1 mmol/L Final  10/23/2013 4.0 3.5 - 5.1 mmol/L Final   Sodium  Date Value Ref Range Status  02/12/2024 137 135 - 145 mmol/L Final  02/01/2024 138 134 - 144 mmol/L Final  10/23/2013 139 136 - 145 mmol/L Final   B Natriuretic Peptide  Date Value Ref Range Status  02/09/2024 1,871.3 (H) 0.0 - 100.0 pg/mL Final    Comment:    Performed at [facility], [address] [place], [place] [address]   Magnesium   Date Value Ref Range Status  02/11/2024 1.9 1.7 - 2.4 mg/dL Final    Comment:    Performed at [facility], [address] [place], [place] [address]   Hgb A1c MFr Bld  Date Value Ref Range Status  01/19/2024 7.1 (H) 4.8 - 5.6 % Final    Comment:    (NOTE) Diagnosis of Diabetes The following HbA1c ranges recommended by the American Diabetes Association (ADA) may be used as an aid in the diagnosis of  diabetes mellitus.  Hemoglobin             Suggested A1C NGSP%              Diagnosis  <5.7                   Non Diabetic  5.7-6.4                Pre-Diabetic  >6.4                   Diabetic  <7.0                   Glycemic control for                       adults with diabetes.      Vital Signs:  Temp:  [97.6 F (36.4 C)-98.6 F (37 C)] 97.7 F (36.5 C) (10/14 0503) Pulse Rate:  [77-90] 81 (10/14 0503) Cardiac Rhythm: Normal sinus rhythm;Bundle branch block (10/13 1903) Resp:  [15-18] 17 (10/14 0503) BP: (88-98)/(67-86) 95/72 (10/14 0503) SpO2:  [94 %-97 %] 94 % (10/14 0503) Weight:  [83.7 kg (184 lb 8.4 oz)] 83.7 kg (184 lb 8.4 oz) (10/14 0500)  Intake/Output Summary (Last 24 hours) at 02/12/2024 0733 Last data filed at 02/11/2024 2300 Gross  per 24 hour  Intake 394.13 ml  Output 1150 ml  Net -755.87 ml    Current Heart Failure Medications:  Loop diuretic: furosemide  5 mg/hr Beta-Blocker: metoprolol  succinate 12.5 mg daily ACEI/ARB/ARNI: none MRA: spironolactone  12.5 mg daily SGLT2i: none Other: midodrine  2.5 mg TID  Prior to admission Heart Failure Medications:  Loop diuretic: torsemide  40 mg daily Beta-Blocker: metoprolol  succinate 12.5 mg daily ACEI/ARB/ARNI: losartan  12.5 mg daily MRA: spironolactone  25 mg daily SGLT2i: none Other: none  Assessment: 1. 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 NYHA class II-III symptoms.  -Symptoms: Reports feeling fine for several days after discharge, but experienced progressive shortness of breath unresolved with outpatient Furoscix . Reports shortness of breath is improved, but remains on 2L O2. Still with orthopnea.  -Volume: Appears hypervolemic. JVP still up. Currently on low dose Lasix  gtt with modest urine output documented. Would likely do better on higher dose bolus than lower dose gtt. Creatinine is stable Consider increasing diuretics.   -Hemodynamics: BP soft, but asymptomatic.  -BB: Continue metoprolol  succinate 12.5 mg daily -ACEI/ARB/ARNI: Home losartan  12.5 mg daily on hold -MRA: Continue spironolactone  25 mg daily for now.  -SGLT2i: No SGLT2i given hx of penile cancer and UTI. -Currently on midodrine  2.5 mg TID. Would aim to wean this when able. Would not reintroduce afterload reduction until BP stable off midodrine .  Plan: 1) Medication changes recommended at this time: -Consider transitioning from infusion to furosemide  80 mg IV BID today   2) Patient assistance: -Pending  3) Education: - Patient has been educated on current HF medications and potential additions to HF medication regimen - Patient verbalizes understanding that over the next few months, these medication doses may change and more medications may be added to optimize HF regimen - Patient has been educated on basic disease state pathophysiology and goals of therapy  Medication Assistance / Insurance Benefits Check: Does the patient have prescription insurance?    Please do not hesitate to reach out with questions or concerns,  Jaun Bash, PharmD, CPP, BCPS, [facility] Heart Failure Pharmacist  Phone - [phone] 02/12/2024 7:33 AM

## 2024-02-12 NOTE — Progress Notes (Signed)
 [facility] Cardiology  CARDIOLOGY PROGRESS NOTE  Patient ID: Jermaine Kramer MRN: [id number] DOB/AGE: 1951/04/20 73 y.o.  Admit date: 02/09/2024 Referring Physician [facility] Primary Physician Perri, ([facility]) Primary Cardiologist Paraschos Reason for Consultation acute on chronic HFrEF  HPI: 73 year old male referred for evaluation of acute on chronic HFrEF.  Patient has a history of HFrEF, atrial fibrillation, and coronary artery disease, status post CABG x 3 (LIMA-LAD, SVG to ramus, SVG to PDA) on 02/16/2021, who presents with 31-month history of worsening shortness of breath, weight gain and peripheral edema.  Patient followed by Ellouise Class FNP at [facility], and has been refractory to outpatient diuretic therapy.  In the emergency room, patient noted to have bilateral peripheral edema, EKG nondiagnostic, chest x-ray revealing pulmonary edema and was treated with dose of IV furosemide .  ECG nondiagnostic.  Patient is status post ICD 06/22/2021.  Patient was seen by nephrology earlier today, and furosemide  IV twice daily was changed to furosemide  drip.  Notable labs include BNP 1871, BUN and creatinine 29 and 2.05, respectively.  Troponin was borderline elevated 27, 21, in the absence of chest pain.  Interval history: -Patient seen and examined this AM, resting on side of hospital bed. -States SOB is improving, LE edema improving as well after wearing compression stockings yesterday.  -Cr stable today on lasix  gtt.   Review of systems complete and found to be negative unless listed above     Past Medical History:  Diagnosis Date   Arrhythmia    atrial fibrillation   CHF (congestive heart failure) (HCC)    Coronary artery disease    Diabetes mellitus without complication (HCC)    GERD (gastroesophageal reflux disease)    Hypercholesteremia    Hypertension    Sleep apnea    Stroke [facility])    2015 or longer ago    Past Surgical History:  Procedure Laterality Date    CATARACT EXTRACTION Bilateral 04/06/2022   CCM GENERATOR AND A/V LEAD INSERTION N/A 09/04/2022   Procedure: CCM GENERATOR AND A/V LEAD INSERTION;  Surgeon: Cindie Ole DASEN, MD;  Location: [facility] INVASIVE CV LAB;  Service: Cardiovascular;  Laterality: N/A;   CHOLECYSTECTOMY     COLONOSCOPY WITH PROPOFOL  N/A 04/14/2019   Procedure: COLONOSCOPY WITH PROPOFOL ;  Surgeon: Toledo, Ladell POUR, MD;  Location: [facility] ENDOSCOPY;  Service: Gastroenterology;  Laterality: N/A;   CORONARY ARTERY BYPASS GRAFT  02/16/2021   ICD IMPLANT N/A 06/22/2021   Procedure: ICD IMPLANT;  Surgeon: Cindie Ole DASEN, MD;  Location: [facility] INVASIVE CV LAB;  Service: Cardiovascular;  Laterality: N/A;   KNEE ARTHROSCOPY     RENAL CYST EXCISION      Medications Prior to Admission  Medication Sig Dispense Refill Last Dose/Taking   apixaban  (ELIQUIS ) 5 MG TABS tablet Take 1 tablet (5 mg total) by mouth 2 (two) times daily. 180 tablet 3 02/09/2024 Morning   atorvastatin  (LIPITOR ) 80 MG tablet Take 1 tablet (80 mg total) by mouth daily. 30 tablet 5 02/09/2024 Morning   calcitRIOL (ROCALTROL) 0.25 MCG capsule Take 0.25 mcg by mouth.   02/09/2024 Morning   Continuous Glucose Sensor (FREESTYLE LIBRE 2 SENSOR) MISC by Does not apply route.   02/09/2024 Morning   Furosemide  (FUROSCIX ) 80 MG/10ML CTKT Take 80mg  daily as directed. 5 each 0 02/09/2024 Morning   metFORMIN  (GLUCOPHAGE ) 500 MG tablet Take 500 mg by mouth 2 (two) times daily.   02/09/2024 Morning   metolazone  (ZAROXOLYN ) 2.5 MG tablet Take 1 tablet (2.5 mg  total) by mouth daily as needed (worsening edema, SOB). 30 tablet 0 Taking As Needed   metoprolol  succinate (TOPROL -XL) 25 MG 24 hr tablet TAKE 1/2 TABLET BY MOUTH EVERY EVENING 45 tablet 3 02/09/2024 Morning   midodrine  (PROAMATINE ) 2.5 MG tablet Take 1 tablet (2.5 mg total) by mouth 2 (two) times daily with a meal. 60 tablet 5 02/09/2024 Morning   omeprazole (PRILOSEC) 40 MG capsule Take 40 mg by mouth 2 (two) times daily.    02/09/2024 Morning   ondansetron  (ZOFRAN -ODT) 4 MG disintegrating tablet Take 1 tablet (4 mg total) by mouth every 8 (eight) hours as needed for nausea or vomiting. 12 tablet 0 Taking As Needed   potassium chloride  SA (KLOR-CON  M) 20 MEQ tablet Take 2 tablets (40 mEq total) by mouth as directed. By heart failure clinic 60 tablet 0 02/09/2024 Morning   spironolactone  (ALDACTONE ) 25 MG tablet TAKE 1 TABLET (25 MG TOTAL) BY MOUTH DAILY. (Patient taking differently: Take 12.5 mg by mouth daily.) 90 tablet 3 02/09/2024 Morning   tamsulosin  (FLOMAX ) 0.4 MG CAPS capsule Take 1 capsule (0.4 mg total) by mouth daily after supper. (Patient taking differently: Take 0.4 mg by mouth in the morning and at bedtime.)   02/09/2024 Morning   torsemide  (DEMADEX ) 20 MG tablet Take 2 tablets (40 mg total) by mouth daily. (Patient taking differently: Take 20 mg by mouth daily.) 60 tablet 0 02/09/2024 Morning   Insulin  Pen Needle (COMFORT EZ PEN NEEDLES) 32G X 8 MM MISC by Other route.      Social History   Socioeconomic History   Marital status: Married    Spouse name: Not on file   Number of children: Not on file   Years of education: Not on file   Highest education level: Not on file  Occupational History   Not on file  Tobacco Use   Smoking status: Never   Smokeless tobacco: Never  Vaping Use   Vaping status: Never Used  Substance and Sexual Activity   Alcohol use: Not Currently   Drug use: No   Sexual activity: Not Currently  Other Topics Concern   Not on file  Social History Narrative   Not on file   Social Drivers of Health   Financial Resource Strain: Low Risk  (10/24/2023)   Received from [facility]   Overall Financial Resource Strain (CARDIA)    Difficulty of Paying Living Expenses: Not hard at all  Food Insecurity: No Food Insecurity (02/10/2024)   Hunger Vital Sign    Worried About Running Out of Food in the Last Year: Never true    Ran Out of Food in the Last Year:  Never true  Transportation Needs: No Transportation Needs (02/10/2024)   PRAPARE - Administrator, Civil Service (Medical): No    Lack of Transportation (Non-Medical): No  Physical Activity: Not on file  Stress: Not on file  Social Connections: Moderately Integrated (02/10/2024)   Social Connection and Isolation Panel    Frequency of Communication with Friends and Family: More than three times a week    Frequency of Social Gatherings with Friends and Family: More than three times a week    Attends Religious Services: More than 4 times per year    Active Member of [employer] or Organizations: No    Attends Banker Meetings: Never    Marital Status: Married  Catering manager Violence: Not At Risk (02/10/2024)   Humiliation, Afraid, Rape, and Kick questionnaire  Fear of Current or Ex-Partner: No    Emotionally Abused: No    Physically Abused: No    Sexually Abused: No    Family History  Problem Relation Age of Onset   Parkinson's disease Mother    Lupus Mother    Heart disease Father      Review of systems complete and found to be negative unless listed above    PHYSICAL EXAM General: Well developed, well nourished, in no acute distress HEENT:  Normocephalic and atramatic Neck:  No JVD.  Lungs: Clear bilaterally to auscultation and percussion. Heart: HRRR . Normal S1 and S2 without gallops or murmurs.  Abdomen: Bowel sounds are positive, abdomen soft and non-tender  Msk:  Back normal, normal gait. Normal strength and tone for age. Extremities: No clubbing, cyanosis or edema.   Neuro: Alert and oriented X 3. Psych:  Good affect, responds appropriately  Labs:   Lab Results  Component Value Date   WBC 5.5 02/12/2024   HGB 11.4 (L) 02/12/2024   HCT 37.1 (L) 02/12/2024   MCV 83.9 02/12/2024   PLT 137 (L) 02/12/2024    Recent Labs  Lab 02/10/24 0315 02/11/24 0419 02/12/24 0341  NA 138   < > 137  K 3.9   < > 4.0  CL 105   < > 100  CO2 25   < >  27  BUN 29*   < > 29*  CREATININE 2.05*   < > 2.05*  CALCIUM  8.8*   < > 8.9  PROT 5.9*  --   --   BILITOT 0.8  --   --   ALKPHOS 56  --   --   ALT 9  --   --   AST 13*  --   --   GLUCOSE 143*   < > 187*   < > = values in this interval not displayed.   Lab Results  Component Value Date   CKTOTAL 21 (L) 06/09/2022   TROPONINI < 0.02 10/19/2013   No results found for: CHOL No results found for: HDL No results found for: LDLCALC No results found for: TRIG No results found for: CHOLHDL No results found for: LDLDIRECT    Radiology: NM Pulmonary Perfusion Result Date: 02/11/2024 CLINICAL DATA:  Pulmonary embolism (PE) suspected, low to intermediate prob, positive D-dimer History of atrial fibrillation, congestive heart failure and hypertension. EXAM: NUCLEAR MEDICINE PERFUSION LUNG SCAN TECHNIQUE: Perfusion images were obtained in multiple projections after intravenous injection of radiopharmaceutical. No ventilation imaging performed. RADIOPHARMACEUTICALS:  4.33 mCi Tc-67m MAA IV COMPARISON:  Chest radiographs 02/11/2024 and 01/19/2024. Lower extremity venous Doppler ultrasound 02/09/2024. Chest CTA 07/24/2021. FINDINGS: There are no wedge-shaped peripheral perfusion defects to suggest pulmonary embolism. 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 IMPRESSION: No evidence of acute pulmonary embolism on perfusion scintigraphy by PISAPED criteria. Electronically Signed   By: Elsie Perone M.D.   On: 02/11/2024 13:11   DG Chest 2 View Result Date: 02/11/2024 CLINICAL DATA:  Shortness of breath. EXAM: CHEST - 2 VIEW COMPARISON:  02/09/2024 and CT chest 07/24/2021. FINDINGS: Trachea is midline. Heart is enlarged, stable. Pacemaker and ICD lead tips are stable in position. Lungs are somewhat low in volume with bibasilar streaky atelectasis, right greater than left. Tiny bilateral pleural effusions. Mildly  elevated right hemidiaphragm. IMPRESSION: Bibasilar atelectasis with tiny bilateral pleural effusions. Electronically Signed   By: Newell Eke M.D.   On:  02/11/2024 10:09   US  Venous Img Lower Bilateral Result Date: 02/09/2024 CLINICAL DATA:  Leg swelling EXAM: BILATERAL LOWER EXTREMITY VENOUS DOPPLER ULTRASOUND TECHNIQUE: Gray-scale sonography with compression, as well as color and duplex ultrasound, were performed to evaluate the deep venous system(s) from the level of the common femoral vein through the popliteal and proximal calf veins. COMPARISON:  None Available. FINDINGS: VENOUS Normal compressibility of the common femoral, superficial femoral, and popliteal veins, as well as the visualized calf veins. Visualized portions of profunda femoral vein and great saphenous vein unremarkable. No filling defects to suggest DVT on grayscale or color Doppler imaging. Doppler waveforms show normal direction of venous flow, normal respiratory plasticity and response to augmentation. OTHER Subcutaneous soft tissue edema of bilateral lower legs. Heterogeneously hypoechoic, ovoid structures in the proximal medial thighs without appreciable internal vascularity on color Doppler evaluation measure 4.3 x 2.6 x 1.4 cm on the right and 4.0 x 1.8 x 1.3 cm on the left. Limitations: none IMPRESSION: 1. No evidence of deep venous thrombosis in the bilateral lower extremities. 2. Bilateral lower leg soft tissue edema. 3. Heterogeneously hypoechoic, ovoid structures in the proximal medial thighs without appreciable internal vascularity on color Doppler evaluation measure 4.3 x 2.6 x 1.4 cm on the right and 4.0 x 1.8 x 1.3 cm on the left. These are nonspecific and may represent hematomas, prominent musculature, or other soft tissue mass. Recommend further evaluation with CT or MRI of the thighs. Electronically Signed   By: Limin  Xu M.D.   On: 02/09/2024 12:44   DG Chest Portable 1 View Result Date: 02/09/2024 EXAM: 1  VIEW(S) XRAY OF THE CHEST 02/09/2024 09:24:00 AM COMPARISON: 01/19/2024 CLINICAL HISTORY: sob. Pt c/o SOB for the past couple of months, that has gotten worse the past week or so. States he started taken 80mg  of Lasix  on Thursday once a day and it has not helped. Report some abd distention. Also c/o of tightness in his chest and some cough and ; congestion. Pt has a hx of CHF. Pt is A\T\Ox4, pt labored and tachypneic. FINDINGS: LINES, TUBES AND DEVICES: Stable left AICD with 2 leads. Stable right AICD with 2 leads. LUNGS AND PLEURA: Mild pulmonary edema. Bibasilar atelectasis. Small bilateral pleural effusions. No pneumothorax. HEART AND MEDIASTINUM: Cardiomegaly. CABG markers noted. BONES AND SOFT TISSUES: Sternotomy wires noted. No acute osseous abnormality. IMPRESSION: 1. Mild pulmonary edema with bibasilar atelectasis 2. Small bilateral pleural effusions Electronically signed by: Waddell Calk MD 02/09/2024 09:53 AM EDT RP Workstation: [id number]   US  ASCITES (ABDOMEN LIMITED) Result Date: 01/19/2024 CLINICAL DATA:  Congestive heart failure.  Previous cholecystectomy. EXAM: LIMITED ABDOMEN ULTRASOUND FOR ASCITES TECHNIQUE: Limited ultrasound survey for ascites was performed in all four abdominal quadrants. COMPARISON:  CT abdomen 12/22/2022 FINDINGS: Minimal ascites over the right upper and right lower quadrants with trace free fluid adjacent the spleen in the left upper quadrant. No significant free fluid in the left lower quadrant this free fluid is new since the previous CT of August 2024. IMPRESSION: Minimal ascites as described. Electronically Signed   By: Toribio Agreste M.D.   On: 01/19/2024 15:02   DG Chest 2 View Result Date: 01/19/2024 CLINICAL DATA:  73 year old male with shortness of breath and unintentional weight gain. Chest tightness. EXAM: CHEST - 2 VIEW COMPARISON:  Chest radiographs 09/04/2022 and earlier. FINDINGS: PA and lateral views 0732 hours. Bilateral cardiac pacemaker/ICD are  stable. Prior CABG. Mild cardiomegaly. Stable to mildly improved lung volumes from last year. No pneumothorax.  No pulmonary edema. No convincing pleural effusion or acute lung opacity. Streaky mild infrahilar opacity appears to be chronic, stable since 2023. Hyperostosis related thoracic spinal ankylosis. No acute osseous abnormality identified. Small surgical clips in the visible upper abdomen. Nonobstructed bowel-gas pattern. IMPRESSION: 1. No acute cardiopulmonary abnormality. 2. Chronic cardiac pacemakers/ICD. Stable mild cardiomegaly. Prior CABG. Electronically Signed   By: VEAR Hurst M.D.   On: 01/19/2024 07:52    EKG: Sinus rhythm with right bundle branch block at 94 bpm  Tele reviewed by me: Sinus rhythm LBBB rate 90s  ASSESSMENT AND PLAN:   1.  Acute on chronic HFrEF, LVEF 20-25% 06/28/2022, with 7-month history of worsening exertional dyspnea, peripheral edema, refractory to outpatient diuretic therapy.  BNP 1871.  Patient initially treated with IV furosemide , now on furosemide  drip per nephrology. 2.  CAD, status post CABG x 3 (LIMA-LAD, SVG-ramus, SVG-PDA 02/16/2021) 3.  Borderline elevated troponin, likely demand supply ischemia, in the absence of chest pain, in the setting of acute on chronic HFrEF 4.  CKD stage IV, been seen by nephrology who changed IV furosemide  to furosemide  gtt.  5.  Paroxysmal atrial fibrillation, rate controlled, on Eliquis  for stroke prevention 6.  Status post ICD  Recommendations  1.  Agree with current therapy 2.  Continue diuresis. Appreciate nephro input. 3.  Continue metoprolol  succinate 12.5 mg daily, spironolactone  12.5 mg daily. Not a candidate for SGLT2i. 4.  Continue Eliquis  for stroke prevention 5.  Defer further cardiac diagnostics at this time  This patient's plan of care was discussed and created with Dr. Florencio and he is in agreement.    Signed: Danita Bloch, PA-C  02/12/2024, 9:35 AM [facility]

## 2024-02-12 NOTE — Plan of Care (Signed)
  Problem: Education: Goal: Ability to demonstrate management of disease process will improve Outcome: Progressing Goal: Ability to verbalize understanding of medication therapies will improve Outcome: Progressing Goal: Individualized Educational Video(s) Outcome: Progressing   Problem: Activity: Goal: Capacity to carry out activities will improve Outcome: Progressing   Problem: Cardiac: Goal: Ability to achieve and maintain adequate cardiopulmonary perfusion will improve Outcome: Progressing   Problem: Education: Goal: Knowledge of General Education information will improve Description: Including pain rating scale, medication(s)/side effects and non-pharmacologic comfort measures Outcome: Progressing   Problem: Health Behavior/Discharge Planning: Goal: Ability to manage health-related needs will improve Outcome: Progressing   Problem: Clinical Measurements: Goal: Ability to maintain clinical measurements within normal limits will improve Outcome: Progressing Goal: Will remain free from infection Outcome: Progressing Goal: Diagnostic test results will improve Outcome: Progressing Goal: Respiratory complications will improve Outcome: Progressing Goal: Cardiovascular complication will be avoided Outcome: Progressing   Problem: Activity: Goal: Risk for activity intolerance will decrease Outcome: Progressing   Problem: Nutrition: Goal: Adequate nutrition will be maintained Outcome: Progressing   Problem: Coping: Goal: Level of anxiety will decrease Outcome: Progressing   Problem: Elimination: Goal: Will not experience complications related to bowel motility Outcome: Progressing Goal: Will not experience complications related to urinary retention Outcome: Progressing   Problem: Pain Managment: Goal: General experience of comfort will improve and/or be controlled Outcome: Progressing   Problem: Safety: Goal: Ability to remain free from injury will  improve Outcome: Progressing   Problem: Skin Integrity: Goal: Risk for impaired skin integrity will decrease Outcome: Progressing   Problem: Education: Goal: Ability to describe self-care measures that may prevent or decrease complications (Diabetes Survival Skills Education) will improve Outcome: Progressing Goal: Individualized Educational Video(s) Outcome: Progressing   Problem: Coping: Goal: Ability to adjust to condition or change in health will improve Outcome: Progressing   Problem: Fluid Volume: Goal: Ability to maintain a balanced intake and output will improve Outcome: Progressing   Problem: Health Behavior/Discharge Planning: Goal: Ability to identify and utilize available resources and services will improve Outcome: Progressing Goal: Ability to manage health-related needs will improve Outcome: Progressing   Problem: Metabolic: Goal: Ability to maintain appropriate glucose levels will improve Outcome: Progressing   Problem: Nutritional: Goal: Maintenance of adequate nutrition will improve Outcome: Progressing Goal: Progress toward achieving an optimal weight will improve Outcome: Progressing   Problem: Skin Integrity: Goal: Risk for impaired skin integrity will decrease Outcome: Progressing   Problem: Tissue Perfusion: Goal: Adequacy of tissue perfusion will improve Outcome: Progressing

## 2024-02-12 NOTE — Progress Notes (Signed)
 Mobility Specialist - Progress Note ALL ON RA Pre-mobility: , SpO2-93%  During mobility:, SpO2-89% recover <30 sec to 94%  Post-mobility:, SPO2- 95%   02/12/24 0925  Mobility  Activity Ambulated with assistance  Level of Assistance Standby assist, set-up cues, supervision of patient - no hands on  Assistive Device Other (Comment) (IV Stand)  Distance Ambulated (ft) 325 ft  Range of Motion/Exercises All extremities  Activity Response Tolerated well  Mobility visit 1 Mobility  Mobility Specialist Start Time (ACUTE ONLY) 0855  Mobility Specialist Stop Time (ACUTE ONLY) 0919  Mobility Specialist Time Calculation (min) (ACUTE ONLY) 24 min    Pt was supine in bed with HOB elevated. Pt agreed to mobility. Pt was on O2 at time of entry CN entered and requested mobility without to see how pt manages. O2 tank was brought as a precaution. Pt was able to EOB independently with bed features. Pt was able to STS with no AD. Pt ambulated well without O2. During activity pt O2 dropped momentarily and recovered in less than 30 sec. O2 levels remained above 92% throughout activity. After activity pt returned to room in bed with guest in room and needs in reach.  Clem Rodes Mobility Specialist 02/12/24, 9:32 AM

## 2024-02-12 NOTE — Progress Notes (Addendum)
 PROGRESS NOTE  SCHAWN BYAS  FMW:[id number] DOB: 1951-04-21 DOA: 02/09/2024 PCP: Perri Constance Sor, PA-C   [name] is a 73 year old male with history of CAD, hyperlipidemia, paroxysmal atrial fibrillation on Eliquis , hypertension, GERD, non-insulin -dependent diabetes mellitus, CKD stage IIIb, who presents emergency department for chief concerns of shortness of breath for 1 week.  02/09/24: Patient was admitted to hospital service for heart failure exacerbation.  02/10/2024: Nephrology has been consulted and initiated furosemide  infusion.  Continue strict I's and O's.  02/11/2024: Continue furosemide  infusion, strict I's and O's.  Nephrology and cardiology have been consulted.  Continue close monitoring of renal function.  sCr 3 weeks ago was 1.96-1.98/eGFR 35.  02/12/2024: Furosemide  infusion will be discontinued with transition to p.o. antibiotic.  Cardiology agreed nothing from a cardiology standpoint.  Pending discharge tomorrow.  Assessment & Plan:   Principal Problem:   CHF (congestive heart failure) (HCC) Active Problems:   AKI (acute kidney injury) with metabolic acidosis(HCC)   Hypotension   GERD (gastroesophageal reflux disease)   Hyperlipidemia LDL goal <70   Dilated cardiomyopathy (HCC)   CAD s/p CABG x 3   Chronic combined systolic and diastolic congestive heart failure (HCC)   DM2 (diabetes mellitus, type 2) (HCC)   Assessment and Plan:  * CHF (congestive heart failure) (HCC) Patient baseline weight is 180 pounds, and on admission he was 187 Strict I's and O's Furosemide  40 mg IV twice daily Home spironolactone  12.5 mg daily resumed Metoprolol  succinate 12.5 mg daily resumed  AKI (acute kidney injury) with metabolic acidosis(HCC) Suspect cardiorenal, nephrology has been consulted at patient's request Nephrology recommends continuous furosemide  infusion Strict I's and O's Recheck BMP in a.m.  Hypotension Home midodrine  2.5 mg p.o. twice daily with  meals  Hyperlipidemia LDL goal <70 Atorvastatin  80 mg daily  GERD (gastroesophageal reflux disease) Home PPI equivalent twice daily resumed  DM2 (diabetes mellitus, type 2) (HCC) Non-insulin  diabetes mellitus type 2 01/19/2024: A1c 7.1% Home metformin  will not be resumed on admission Insulin  SSI with at bedtime coverage ordered  DVT prophylaxis: Apixaban  Code Status: Full code Family Communication: Updated spouse at bedside with patient's permission Disposition Plan: Pending clinical course Level of care: Progressive  Consultants:  Cardiology, nephrology  Procedures:  None at this time  Antimicrobials: None indicated  Subjective:  At bedside, patient was awake alert and oriented x 4.  He does not appear to be in acute distress.  He reports he is feeling significantly better and he denies further shortness of breath.  His swelling in his legs has improved.  Objective: Vitals:   02/12/24 0500 02/12/24 0503 02/12/24 0836 02/12/24 0838  BP:  95/72  102/76  Pulse:  81 96 92  Resp:  17    Temp:  97.7 F (36.5 C) 97.6 F (36.4 C)   TempSrc:      SpO2:  94% 95% 96%  Weight: 83.7 kg     Height:       Intake/Output Summary (Last 24 hours) at 02/12/2024 1150 Last data filed at 02/12/2024 1049 Gross per 24 hour  Intake 274.13 ml  Output 1050 ml  Net -775.87 ml   Filed Weights   02/10/24 0527 02/11/24 0500 02/12/24 0500  Weight: 83.1 kg 82 kg 83.7 kg   Examination:  General exam: Appears calm and comfortable  Respiratory system: Clear to auscultation. Respiratory effort normal. Cardiovascular system: S1 & S2 heard, RRR. No JVD, murmurs, rubs, gallops or clicks. No pedal edema. Gastrointestinal system: Obese abdomen, abdomen  is nondistended, soft and nontender. No organomegaly or masses felt. Normal bowel sounds heard. Central nervous system: Alert and oriented. No focal neurological deficits. Extremities: Symmetric 5 x 5 power. Skin: No rashes, lesions or  ulcers Psychiatry: Judgement and insight appear normal. Mood & affect appropriate.   Data Reviewed: I have personally reviewed following labs and imaging studies  CBC: Recent Labs  Lab 02/09/24 0921 02/10/24 0315 02/11/24 0419 02/12/24 0341  WBC 5.0 5.1 5.1 5.5  HGB 11.8* 11.0* 11.2* 11.4*  HCT 38.3* 34.2* 35.7* 37.1*  MCV 84.7 82.4 83.0 83.9  PLT 155 146* 152 137*   Basic Metabolic Panel: Recent Labs  Lab 02/09/24 0921 02/10/24 0315 02/10/24 1041 02/11/24 0419 02/12/24 0341  NA 138 138  --  138 137  K 4.3 3.9  --  4.0 4.0  CL 102 105  --  101 100  CO2 26 25  --  26 27  GLUCOSE 241* 143*  --  155* 187*  BUN 31* 29*  --  30* 29*  CREATININE 2.33* 2.05*  --  2.01* 2.05*  CALCIUM  9.0 8.8*  --  8.8* 8.9  MG  --   --  1.5* 1.9  --    GFR: Estimated Creatinine Clearance: 31 mL/min (A) (by C-G formula based on SCr of 2.05 mg/dL (H)).  Liver Function Tests: Recent Labs  Lab 02/10/24 0315  AST 13*  ALT 9  ALKPHOS 56  BILITOT 0.8  PROT 5.9*  ALBUMIN 3.3*   Coagulation Profile: Recent Labs  Lab 02/10/24 0315  INR 1.6*   CBG: Recent Labs  Lab 02/11/24 0751 02/11/24 1305 02/11/24 1607 02/11/24 2109 02/12/24 0833  GLUCAP 180* 219* 191* 185* 173*   Radiology Studies: NM Pulmonary Perfusion Result Date: 02/11/2024 CLINICAL DATA:  Pulmonary embolism (PE) suspected, low to intermediate prob, positive D-dimer History of atrial fibrillation, congestive heart failure and hypertension. EXAM: NUCLEAR MEDICINE PERFUSION LUNG SCAN TECHNIQUE: Perfusion images were obtained in multiple projections after intravenous injection of radiopharmaceutical. No ventilation imaging performed. RADIOPHARMACEUTICALS:  4.33 mCi Tc-81m MAA IV COMPARISON:  Chest radiographs 02/11/2024 and 01/19/2024. Lower extremity venous Doppler ultrasound 02/09/2024. Chest CTA 07/24/2021. FINDINGS: There are no wedge-shaped peripheral perfusion defects to suggest pulmonary embolism. Fissure sign noted on  the right and defect related to left subclavian pacemaker generator overlying the left upper lobe. IMPRESSION: No evidence of acute pulmonary embolism on perfusion scintigraphy by PISAPED criteria. Electronically Signed   By: Elsie Perone M.D.   On: 02/11/2024 13:11   DG Chest 2 View Result Date: 02/11/2024 CLINICAL DATA:  Shortness of breath. EXAM: CHEST - 2 VIEW COMPARISON:  02/09/2024 and CT chest 07/24/2021. FINDINGS: Trachea is midline. Heart is enlarged, stable. Pacemaker and ICD lead tips are stable in position. Lungs are somewhat low in volume with bibasilar streaky atelectasis, right greater than left. Tiny bilateral pleural effusions. Mildly elevated right hemidiaphragm. IMPRESSION: Bibasilar atelectasis with tiny bilateral pleural effusions. Electronically Signed   By: Newell Eke M.D.   On: 02/11/2024 10:09   Scheduled Meds:  apixaban   5 mg Oral BID   atorvastatin   80 mg Oral Daily   calcitRIOL  0.25 mcg Oral Daily   insulin  aspart  0-5 Units Subcutaneous QHS   insulin  aspart  0-9 Units Subcutaneous TID WC   metoprolol  succinate  12.5 mg Oral QPM   midodrine   2.5 mg Oral BID WC   pantoprazole   80 mg Oral BID   spironolactone   12.5 mg Oral Daily   tamsulosin   0.4 mg Oral QPC breakfast   Continuous Infusions:  furosemide  (LASIX ) 200 mg in dextrose  5 % 100 mL (2 mg/mL) infusion 5 mg/hr (02/11/24 2105)    LOS: 3 days   Time spent: 35 minutes  Dr. Sherre [facility] If 7PM-7AM, please contact night-coverage 02/12/2024, 11:50 AM

## 2024-02-12 NOTE — Progress Notes (Signed)
 [facility]  ROUNDING NOTE   Subjective:   Patient well-known to us  from the office.  He has known past medical history of atrial fibrillation, hyperlipidemia, diabetes mellitus type 2, chronic systolic heart failure ejection fraction 30 to 35%, obstructive sleep apnea, hypertension, history of CVA, GERD, coronary disease status post CABG, chronic kidney disease stage IIIb who presented with increasing shortness of breath and acute exacerbation of chronic systolic heart failure.   Update: Patient seen sitting at bedside Alert and oriented States he feels well Tolerating meals Room air  Creatinine 2.05  Objective:  Vital signs in last 24 hours:  Temp:  [97.6 F (36.4 C)-98.6 F (37 C)] 97.8 F (36.6 C) (10/14 1225) Pulse Rate:  [77-103] 103 (10/14 1225) Resp:  [15-18] 17 (10/14 0503) BP: (93-104)/(71-86) 104/72 (10/14 1225) SpO2:  [94 %-98 %] 98 % (10/14 1225) Weight:  [83.7 kg] 83.7 kg (10/14 0500)  Weight change: 1.7 kg Filed Weights   02/10/24 0527 02/11/24 0500 02/12/24 0500  Weight: 83.1 kg 82 kg 83.7 kg    Intake/Output: I/O last 3 completed shifts: In: 430 [P.O.:360; I.V.:70] Out: 1700 [Urine:1700]   Intake/Output this shift:  Total I/O In: -  Out: 200 [Urine:200]  Physical Exam: General: No acute distress  Head: Normocephalic, atraumatic. Moist oral mucosal membranes  Neck: Supple  Lungs:  Clear, room air  Heart: S1S2 no rubs  Abdomen:  Soft, nontender, bowel sounds present  Extremities: 1+ peripheral edema.  Neurologic: Awake, alert, following commands  Skin: No acute rash  Access: No hemodialysis access    Basic Metabolic Panel: Recent Labs  Lab 02/09/24 0921 02/10/24 0315 02/10/24 1041 02/11/24 0419 02/12/24 0341  NA 138 138  --  138 137  K 4.3 3.9  --  4.0 4.0  CL 102 105  --  101 100  CO2 26 25  --  26 27  GLUCOSE 241* 143*  --  155* 187*  BUN 31* 29*  --  30* 29*  CREATININE 2.33* 2.05*  --  2.01* 2.05*  CALCIUM  9.0  8.8*  --  8.8* 8.9  MG  --   --  1.5* 1.9  --     Liver Function Tests: Recent Labs  Lab 02/10/24 0315  AST 13*  ALT 9  ALKPHOS 56  BILITOT 0.8  PROT 5.9*  ALBUMIN 3.3*   No results for input(s): LIPASE, AMYLASE in the last 168 hours. No results for input(s): AMMONIA in the last 168 hours.  CBC: Recent Labs  Lab 02/09/24 0921 02/10/24 0315 02/11/24 0419 02/12/24 0341  WBC 5.0 5.1 5.1 5.5  HGB 11.8* 11.0* 11.2* 11.4*  HCT 38.3* 34.2* 35.7* 37.1*  MCV 84.7 82.4 83.0 83.9  PLT 155 146* 152 137*    Cardiac Enzymes: No results for input(s): CKTOTAL, CKMB, CKMBINDEX, TROPONINI in the last 168 hours.  BNP: Invalid input(s): POCBNP  CBG: Recent Labs  Lab 02/11/24 1305 02/11/24 1607 02/11/24 2109 02/12/24 0833 02/12/24 1228  GLUCAP 219* 191* 185* 173* 160*    Microbiology: Results for orders placed or performed during the hospital encounter of 01/19/24  Resp panel by RT-PCR (RSV, Flu A&B, Covid) Anterior Nasal Swab     Status: None   Collection Time: 01/19/24  7:22 AM   Specimen: Anterior Nasal Swab  Result Value Ref Range Status   SARS Coronavirus 2 by RT PCR NEGATIVE NEGATIVE Final    Comment: (NOTE) SARS-CoV-2 target nucleic acids are NOT DETECTED.  The SARS-CoV-2 RNA is generally  detectable in upper respiratory specimens during the acute phase of infection. The lowest concentration of SARS-CoV-2 viral copies this assay can detect is 138 copies/mL. A negative result does not preclude SARS-Cov-2 infection and should not be used as the sole basis for treatment or other patient management decisions. A negative result may occur with  improper specimen collection/handling, submission of specimen other than nasopharyngeal swab, presence of viral mutation(s) within the areas targeted by this assay, and inadequate number of viral copies(<138 copies/mL). A negative result must be combined with clinical observations, patient history, and  epidemiological information. The expected result is Negative.  Fact Sheet for Patients:  BloggerCourse.com  Fact Sheet for Healthcare Providers:  SeriousBroker.it  This test is no t yet approved or cleared by the [place]  FDA and  has been authorized for detection and/or diagnosis of SARS-CoV-2 by FDA under an Emergency Use Authorization (EUA). This EUA will remain  in effect (meaning this test can be used) for the duration of the COVID-19 declaration under Section 564(b)(1) of the Act, 21 U.S.C.section 360bbb-3(b)(1), unless the authorization is terminated  or revoked sooner.       Influenza A by PCR NEGATIVE NEGATIVE Final   Influenza B by PCR NEGATIVE NEGATIVE Final    Comment: (NOTE) The Xpert Xpress SARS-CoV-2/FLU/RSV plus assay is intended as an aid in the diagnosis of influenza from Nasopharyngeal swab specimens and should not be used as a sole basis for treatment. Nasal washings and aspirates are unacceptable for Xpert Xpress SARS-CoV-2/FLU/RSV testing.  Fact Sheet for Patients: BloggerCourse.com  Fact Sheet for Healthcare Providers: SeriousBroker.it  This test is not yet approved or cleared by the [place]  FDA and has been authorized for detection and/or diagnosis of SARS-CoV-2 by FDA under an Emergency Use Authorization (EUA). This EUA will remain in effect (meaning this test can be used) for the duration of the COVID-19 declaration under Section 564(b)(1) of the Act, 21 U.S.C. section 360bbb-3(b)(1), unless the authorization is terminated or revoked.     Resp Syncytial Virus by PCR NEGATIVE NEGATIVE Final    Comment: (NOTE) Fact Sheet for Patients: BloggerCourse.com  Fact Sheet for Healthcare Providers: SeriousBroker.it  This test is not yet approved or cleared by the [place]  FDA and has been  authorized for detection and/or diagnosis of SARS-CoV-2 by FDA under an Emergency Use Authorization (EUA). This EUA will remain in effect (meaning this test can be used) for the duration of the COVID-19 declaration under Section 564(b)(1) of the Act, 21 U.S.C. section 360bbb-3(b)(1), unless the authorization is terminated or revoked.  Performed at [facility], [address] [place], [place] [address]     Coagulation Studies: Recent Labs    02/10/24 0315  LABPROT 20.3*  INR 1.6*    Urinalysis: No results for input(s): COLORURINE, LABSPEC, PHURINE, GLUCOSEU, HGBUR, BILIRUBINUR, KETONESUR, PROTEINUR, UROBILINOGEN, NITRITE, LEUKOCYTESUR in the last 72 hours.  Invalid input(s): APPERANCEUR    Imaging: NM Pulmonary Perfusion Result Date: 02/11/2024 CLINICAL DATA:  Pulmonary embolism (PE) suspected, low to intermediate prob, positive D-dimer History of atrial fibrillation, congestive heart failure and hypertension. EXAM: NUCLEAR MEDICINE PERFUSION LUNG SCAN TECHNIQUE: Perfusion images were obtained in multiple projections after intravenous injection of radiopharmaceutical. No ventilation imaging performed. RADIOPHARMACEUTICALS:  4.33 mCi Tc-32m MAA IV COMPARISON:  Chest radiographs 02/11/2024 and 01/19/2024. Lower extremity venous Doppler ultrasound 02/09/2024. Chest CTA 07/24/2021. FINDINGS: There are no wedge-shaped peripheral perfusion defects to suggest pulmonary embolism. Fissure sign noted on the right and defect related to left  subclavian pacemaker generator overlying the left upper lobe. IMPRESSION: No evidence of acute pulmonary embolism on perfusion scintigraphy by PISAPED criteria. Electronically Signed   By: Elsie Perone M.D.   On: 02/11/2024 13:11   DG Chest 2 View Result Date: 02/11/2024 CLINICAL DATA:  Shortness of breath. EXAM: CHEST - 2 VIEW COMPARISON:  02/09/2024 and CT chest 07/24/2021. FINDINGS: Trachea is midline. Heart is  enlarged, stable. Pacemaker and ICD lead tips are stable in position. Lungs are somewhat low in volume with bibasilar streaky atelectasis, right greater than left. Tiny bilateral pleural effusions. Mildly elevated right hemidiaphragm. IMPRESSION: Bibasilar atelectasis with tiny bilateral pleural effusions. Electronically Signed   By: Newell Eke M.D.   On: 02/11/2024 10:09     Medications:    furosemide  (LASIX ) 200 mg in dextrose  5 % 100 mL (2 mg/mL) infusion 5 mg/hr (02/11/24 2105)    apixaban   5 mg Oral BID   atorvastatin   80 mg Oral Daily   calcitRIOL  0.25 mcg Oral Daily   insulin  aspart  0-5 Units Subcutaneous QHS   insulin  aspart  0-9 Units Subcutaneous TID WC   metoprolol  succinate  12.5 mg Oral QPM   midodrine   2.5 mg Oral BID WC   pantoprazole   80 mg Oral BID   spironolactone   12.5 mg Oral Daily   tamsulosin   0.4 mg Oral QPC breakfast   acetaminophen  **OR** acetaminophen , hydrALAZINE , ipratropium-albuterol , melatonin, ondansetron  **OR** ondansetron  (ZOFRAN ) IV, oxyCODONE , polyethylene glycol  Assessment/ Plan:  73 y.o. male with past medical history of atrial fibrillation, hyperlipidemia, diabetes mellitus type 2 with chronic kidney disease, chronic systolic heart failure ejection fraction 20 to 25%, obstructive sleep apnea, hypertension, history of CVA, GERD, coronary disease status post CABG who returns now with shortness of breath and acute exacerbation of chronic systolic heart failure.  1.  Acute kidney injury/chronic kidney disease stage IIIb likely secondary to cardiorenal syndrome.  Recent baseline creatinine appears to be 1.9.    Creatinine remains at baseline.  Adequate urine output, 1150 mL in previous 24 hours.    2.  Acute on chronic systolic heart failure exacerbation.  Ejection fraction 20 to 25% and quite low at baseline.  Patient appears more comfortable, agreeable to transition to oral diuretics.  Will place patient back on home regimen, torsemide  40 mg  daily.  3.  Secondary hyperparathyroidism.  Maintain the patient Calcitrol 0.25 mcg p.o. daily.  If calcium  within optimal range.    LOS: 3 Karder Goodin 10/14/20251:27 PM

## 2024-02-12 NOTE — Assessment & Plan Note (Signed)
 Non-insulin  diabetes mellitus type 2 01/19/2024: A1c 7.1% Home metformin  will not be resumed on admission Insulin  SSI with at bedtime coverage ordered

## 2024-02-13 LAB — CBC
HCT: 37.1 % — ABNORMAL LOW (ref 39.0–52.0)
Hemoglobin: 11.5 g/dL — ABNORMAL LOW (ref 13.0–17.0)
MCH: 25.8 pg — ABNORMAL LOW (ref 26.0–34.0)
MCHC: 31 g/dL (ref 30.0–36.0)
MCV: 83.4 fL (ref 80.0–100.0)
Platelets: 159 K/uL (ref 150–400)
RBC: 4.45 MIL/uL (ref 4.22–5.81)
RDW: 14.6 % (ref 11.5–15.5)
WBC: 4.2 K/uL (ref 4.0–10.5)
nRBC: 0 % (ref 0.0–0.2)

## 2024-02-13 LAB — BASIC METABOLIC PANEL WITH GFR
Anion gap: 11 (ref 5–15)
BUN: 31 mg/dL — ABNORMAL HIGH (ref 8–23)
CO2: 26 mmol/L (ref 22–32)
Calcium: 8.7 mg/dL — ABNORMAL LOW (ref 8.9–10.3)
Chloride: 99 mmol/L (ref 98–111)
Creatinine, Ser: 2.43 mg/dL — ABNORMAL HIGH (ref 0.61–1.24)
GFR, Estimated: 27 mL/min — ABNORMAL LOW (ref >60)
Glucose, Bld: 140 mg/dL — ABNORMAL HIGH (ref 70–99)
Potassium: 4.1 mmol/L (ref 3.5–5.1)
Sodium: 136 mmol/L (ref 135–145)

## 2024-02-13 LAB — GLUCOSE, CAPILLARY
Glucose-Capillary: 146 mg/dL — ABNORMAL HIGH (ref 70–99)
Glucose-Capillary: 175 mg/dL — ABNORMAL HIGH (ref 70–99)

## 2024-02-13 MED ORDER — ALBUTEROL SULFATE HFA 108 (90 BASE) MCG/ACT IN AERS: 2.0000 | INHALATION_SPRAY | Freq: Four times a day (QID) | RESPIRATORY_TRACT | 2 refills | Status: DC | PRN

## 2024-02-13 MED ORDER — SPIRONOLACTONE 25 MG PO TABS: 12.5000 mg | ORAL_TABLET | Freq: Every day | ORAL | Status: DC

## 2024-02-13 MED ORDER — TORSEMIDE 20 MG PO TABS: 40.0000 mg | ORAL_TABLET | Freq: Every day | ORAL | Status: DC

## 2024-02-13 NOTE — Progress Notes (Signed)
 Heart Failure Stewardship Pharmacy Note  PCP: Perri Constance Sor, PA-C PCP-Cardiologist: None  HPI: Jermaine Kramer is a 73 y.o. male with CAD, chronic HFrEF, T2DM, HTN, HLD, CKD stage IIIb, BPH, PPM  who presented with shortness of breath and peripheral edema. On admission, BNP was 1871.3, HS-troponin was 27, and d-dimer 4.93. Chest x-ray noted mild pulmonary edema and small bilateral pleural effusions.   Pertinent cardiac history: Underwent CABG 2022. TTE 05/2021 with LVEF <20%, G1DD. ICD placed 06/2021. TTE 08/2021 with LVEF 30-35% and G3DD. TTE 06/2022 with LVEF 20-25%, G1DD, mild RV dysfunction. Gen change and A/V lead insertion in 08/2022.  Pertinent Lab Values: Creatinine  Date Value Ref Range Status  10/23/2013 0.95 0.60 - 1.30 mg/dL Final   Creatinine, Ser  Date Value Ref Range Status  02/13/2024 2.43 (H) 0.61 - 1.24 mg/dL Final   BUN  Date Value Ref Range Status  02/13/2024 31 (H) 8 - 23 mg/dL Final  89/96/7974 25 8 - 27 mg/dL Final  93/74/7984 14 7 - 18 mg/dL Final   Potassium  Date Value Ref Range Status  02/13/2024 4.1 3.5 - 5.1 mmol/L Final  10/23/2013 4.0 3.5 - 5.1 mmol/L Final   Sodium  Date Value Ref Range Status  02/13/2024 136 135 - 145 mmol/L Final  02/01/2024 138 134 - 144 mmol/L Final  10/23/2013 139 136 - 145 mmol/L Final   B Natriuretic Peptide  Date Value Ref Range Status  02/09/2024 1,871.3 (H) 0.0 - 100.0 pg/mL Final    Comment:    Performed at [facility], [address] [place], [place] [address]   Magnesium   Date Value Ref Range Status  02/11/2024 1.9 1.7 - 2.4 mg/dL Final    Comment:    Performed at [facility], [address] [place], [place] [address]   Hgb A1c MFr Bld  Date Value Ref Range Status  01/19/2024 7.1 (H) 4.8 - 5.6 % Final    Comment:    (NOTE) Diagnosis of Diabetes The following HbA1c ranges recommended by the American Diabetes Association (ADA) may be used as an aid in the diagnosis of  diabetes mellitus.  Hemoglobin             Suggested A1C NGSP%              Diagnosis  <5.7                   Non Diabetic  5.7-6.4                Pre-Diabetic  >6.4                   Diabetic  <7.0                   Glycemic control for                       adults with diabetes.      Vital Signs:  Temp:  [97.6 F (36.4 C)-98.3 F (36.8 C)] 97.9 F (36.6 C) (10/15 0722) Pulse Rate:  [61-103] 87 (10/15 0722) Cardiac Rhythm: Normal sinus rhythm (10/14 2200) Resp:  [16-20] 19 (10/15 0722) BP: (94-104)/(46-77) 102/74 (10/15 0722) SpO2:  [91 %-98 %] 94 % (10/15 0722) Weight:  [81.9 kg (180 lb 8.9 oz)] 81.9 kg (180 lb 8.9 oz) (10/15 0500)  Intake/Output Summary (Last 24 hours) at 02/13/2024 0734 Last data filed at 02/13/2024 0050 Gross per 24  hour  Intake 240 ml  Output 900 ml  Net -660 ml    Current Heart Failure Medications:  Loop diuretic: none Beta-Blocker: metoprolol  succinate 12.5 mg daily ACEI/ARB/ARNI: none MRA: spironolactone  12.5 mg daily SGLT2i: none Other: midodrine  2.5 mg TID  Prior to admission Heart Failure Medications:  Loop diuretic: torsemide  40 mg daily Beta-Blocker: metoprolol  succinate 12.5 mg daily ACEI/ARB/ARNI: losartan  12.5 mg daily MRA: spironolactone  25 mg daily SGLT2i: none Other: none  Assessment: 1. 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 NYHA class II-III symptoms.  -Symptoms: Reports feeling fine for several days after discharge, but experienced progressive shortness of breath unresolved with outpatient Furoscix . Reports shortness of breath is resolved and now off O2. Orthopnea improved and patient slept well. LEE is also resolved.  -Volume: Appears euvolemic. Transitioned to torsemide  today. Creatinine bumped.  -Hemodynamics: BP soft, but asymptomatic. Currently on midodrine  2.5 mg TID. Hopefully can discontinue this in clinic.  -BB: Continue metoprolol   succinate 12.5 mg daily -ACEI/ARB/ARNI: Home losartan  12.5 mg daily on hold. Would not resume until off midodrine  -MRA: Continue spironolactone  25 mg daily for now.  -SGLT2i: No SGLT2i given hx of penile cancer and UTI.   Plan: 1) Medication changes recommended at this time: -None  2) Patient assistance: -Pending  3) Education: - Patient has been educated on current HF medications and potential additions to HF medication regimen - Patient verbalizes understanding that over the next few months, these medication doses may change and more medications may be added to optimize HF regimen - Patient has been educated on basic disease state pathophysiology and goals of therapy  Medication Assistance / Insurance Benefits Check: Does the patient have prescription insurance?    Please do not hesitate to reach out with questions or concerns,  Jaun Bash, PharmD, CPP, BCPS, [facility] Heart Failure Pharmacist  Phone - [phone] 02/13/2024 7:34 AM

## 2024-02-13 NOTE — Discharge Instructions (Signed)
 Please follow up with your heart failure team to make sure your medications are working appropriately and do not need to be changed.

## 2024-02-13 NOTE — Progress Notes (Signed)
 [facility] Cardiology  CARDIOLOGY PROGRESS NOTE  Patient ID: Jermaine Kramer MRN: [id number] DOB/AGE: Dec 19, 1950 73 y.o.  Admit date: 02/09/2024 Referring Physician [facility] Primary Physician Perri, ([facility]) Primary Cardiologist Paraschos Reason for Consultation acute on chronic HFrEF  HPI: 73 year old male referred for evaluation of acute on chronic HFrEF.  Patient has a history of HFrEF, atrial fibrillation, and coronary artery disease, status post CABG x 3 (LIMA-LAD, SVG to ramus, SVG to PDA) on 02/16/2021, who presents with 66-month history of worsening shortness of breath, weight gain and peripheral edema.  Patient followed by Ellouise Class FNP at [facility], and has been refractory to outpatient diuretic therapy.  In the emergency room, patient noted to have bilateral peripheral edema, EKG nondiagnostic, chest x-ray revealing pulmonary edema and was treated with dose of IV furosemide .  ECG nondiagnostic.  Patient is status post ICD 06/22/2021.  Patient was seen by nephrology earlier today, and furosemide  IV twice daily was changed to furosemide  drip.  Notable labs include BNP 1871, BUN and creatinine 29 and 2.05, respectively.  Troponin was borderline elevated 27, 21, in the absence of chest pain.  Interval history: - Patient seen and examined this AM, resting in bedside chair. - Reports he feels much better today.  Denies any shortness of breath and lower extremity edema has resolved. - Nephrology stopped IV Lasix  and transition to p.o.  Review of systems complete and found to be negative unless listed above     Past Medical History:  Diagnosis Date   Arrhythmia    atrial fibrillation   CHF (congestive heart failure) (HCC)    Coronary artery disease    Diabetes mellitus without complication (HCC)    GERD (gastroesophageal reflux disease)    Hypercholesteremia    Hypertension    Sleep apnea    Stroke [facility])    2015 or longer ago    Past Surgical History:   Procedure Laterality Date   CATARACT EXTRACTION Bilateral 04/06/2022   CCM GENERATOR AND A/V LEAD INSERTION N/A 09/04/2022   Procedure: CCM GENERATOR AND A/V LEAD INSERTION;  Surgeon: Cindie Ole DASEN, MD;  Location: [facility] INVASIVE CV LAB;  Service: Cardiovascular;  Laterality: N/A;   CHOLECYSTECTOMY     COLONOSCOPY WITH PROPOFOL  N/A 04/14/2019   Procedure: COLONOSCOPY WITH PROPOFOL ;  Surgeon: Toledo, Ladell POUR, MD;  Location: [facility] ENDOSCOPY;  Service: Gastroenterology;  Laterality: N/A;   CORONARY ARTERY BYPASS GRAFT  02/16/2021   ICD IMPLANT N/A 06/22/2021   Procedure: ICD IMPLANT;  Surgeon: Cindie Ole DASEN, MD;  Location: [facility] INVASIVE CV LAB;  Service: Cardiovascular;  Laterality: N/A;   KNEE ARTHROSCOPY     RENAL CYST EXCISION      Medications Prior to Admission  Medication Sig Dispense Refill Last Dose/Taking   apixaban  (ELIQUIS ) 5 MG TABS tablet Take 1 tablet (5 mg total) by mouth 2 (two) times daily. 180 tablet 3 02/09/2024 Morning   atorvastatin  (LIPITOR ) 80 MG tablet Take 1 tablet (80 mg total) by mouth daily. 30 tablet 5 02/09/2024 Morning   calcitRIOL (ROCALTROL) 0.25 MCG capsule Take 0.25 mcg by mouth.   02/09/2024 Morning   Continuous Glucose Sensor (FREESTYLE LIBRE 2 SENSOR) MISC by Does not apply route.   02/09/2024 Morning   Furosemide  (FUROSCIX ) 80 MG/10ML CTKT Take 80mg  daily as directed. 5 each 0 02/09/2024 Morning   metFORMIN  (GLUCOPHAGE ) 500 MG tablet Take 500 mg by mouth 2 (two) times daily.   02/09/2024 Morning   metolazone  (ZAROXOLYN ) 2.5 MG tablet  Take 1 tablet (2.5 mg total) by mouth daily as needed (worsening edema, SOB). 30 tablet 0 Taking As Needed   metoprolol  succinate (TOPROL -XL) 25 MG 24 hr tablet TAKE 1/2 TABLET BY MOUTH EVERY EVENING 45 tablet 3 02/09/2024 Morning   midodrine  (PROAMATINE ) 2.5 MG tablet Take 1 tablet (2.5 mg total) by mouth 2 (two) times daily with a meal. 60 tablet 5 02/09/2024 Morning   omeprazole (PRILOSEC) 40 MG capsule Take 40 mg by  mouth 2 (two) times daily.   02/09/2024 Morning   ondansetron  (ZOFRAN -ODT) 4 MG disintegrating tablet Take 1 tablet (4 mg total) by mouth every 8 (eight) hours as needed for nausea or vomiting. 12 tablet 0 Taking As Needed   potassium chloride  SA (KLOR-CON  M) 20 MEQ tablet Take 2 tablets (40 mEq total) by mouth as directed. By heart failure clinic 60 tablet 0 02/09/2024 Morning   spironolactone  (ALDACTONE ) 25 MG tablet TAKE 1 TABLET (25 MG TOTAL) BY MOUTH DAILY. (Patient taking differently: Take 12.5 mg by mouth daily.) 90 tablet 3 02/09/2024 Morning   tamsulosin  (FLOMAX ) 0.4 MG CAPS capsule Take 1 capsule (0.4 mg total) by mouth daily after supper. (Patient taking differently: Take 0.4 mg by mouth in the morning and at bedtime.)   02/09/2024 Morning   torsemide  (DEMADEX ) 20 MG tablet Take 2 tablets (40 mg total) by mouth daily. (Patient taking differently: Take 20 mg by mouth daily.) 60 tablet 0 02/09/2024 Morning   Insulin  Pen Needle (COMFORT EZ PEN NEEDLES) 32G X 8 MM MISC by Other route.      Social History   Socioeconomic History   Marital status: Married    Spouse name: Not on file   Number of children: Not on file   Years of education: Not on file   Highest education level: Not on file  Occupational History   Not on file  Tobacco Use   Smoking status: Never   Smokeless tobacco: Never  Vaping Use   Vaping status: Never Used  Substance and Sexual Activity   Alcohol use: Not Currently   Drug use: No   Sexual activity: Not Currently  Other Topics Concern   Not on file  Social History Narrative   Not on file   Social Drivers of Health   Financial Resource Strain: Low Risk  (10/24/2023)   Received from [facility]   Overall Financial Resource Strain (CARDIA)    Difficulty of Paying Living Expenses: Not hard at all  Food Insecurity: No Food Insecurity (02/10/2024)   Hunger Vital Sign    Worried About Running Out of Food in the Last Year: Never true    Ran Out  of Food in the Last Year: Never true  Transportation Needs: No Transportation Needs (02/10/2024)   PRAPARE - Administrator, Civil Service (Medical): No    Lack of Transportation (Non-Medical): No  Physical Activity: Not on file  Stress: Not on file  Social Connections: Moderately Integrated (02/10/2024)   Social Connection and Isolation Panel    Frequency of Communication with Friends and Family: More than three times a week    Frequency of Social Gatherings with Friends and Family: More than three times a week    Attends Religious Services: More than 4 times per year    Active Member of [employer] or Organizations: No    Attends Banker Meetings: Never    Marital Status: Married  Catering manager Violence: Not At Risk (02/10/2024)   Humiliation, Afraid,  Rape, and Kick questionnaire    Fear of Current or Ex-Partner: No    Emotionally Abused: No    Physically Abused: No    Sexually Abused: No    Family History  Problem Relation Age of Onset   Parkinson's disease Mother    Lupus Mother    Heart disease Father      Review of systems complete and found to be negative unless listed above    PHYSICAL EXAM General: Well developed, well nourished, in no acute distress HEENT:  Normocephalic and atramatic Neck:  No JVD.  Lungs: Clear bilaterally to auscultation and percussion. Heart: HRRR . Normal S1 and S2 without gallops or murmurs.  Abdomen: Bowel sounds are positive, abdomen soft and non-tender  Msk:  Back normal, normal gait. Normal strength and tone for age. Extremities: No clubbing, cyanosis or edema.   Neuro: Alert and oriented X 3. Psych:  Good affect, responds appropriately  Labs:   Lab Results  Component Value Date   WBC 4.2 02/13/2024   HGB 11.5 (L) 02/13/2024   HCT 37.1 (L) 02/13/2024   MCV 83.4 02/13/2024   PLT 159 02/13/2024    Recent Labs  Lab 02/10/24 0315 02/11/24 0419 02/13/24 0432  NA 138   < > 136  K 3.9   < > 4.1  CL 105   <  > 99  CO2 25   < > 26  BUN 29*   < > 31*  CREATININE 2.05*   < > 2.43*  CALCIUM  8.8*   < > 8.7*  PROT 5.9*  --   --   BILITOT 0.8  --   --   ALKPHOS 56  --   --   ALT 9  --   --   AST 13*  --   --   GLUCOSE 143*   < > 140*   < > = values in this interval not displayed.   Lab Results  Component Value Date   CKTOTAL 21 (L) 06/09/2022   TROPONINI < 0.02 10/19/2013   No results found for: CHOL No results found for: HDL No results found for: LDLCALC No results found for: TRIG No results found for: CHOLHDL No results found for: LDLDIRECT    Radiology: NM Pulmonary Perfusion Result Date: 02/11/2024 CLINICAL DATA:  Pulmonary embolism (PE) suspected, low to intermediate prob, positive D-dimer History of atrial fibrillation, congestive heart failure and hypertension. EXAM: NUCLEAR MEDICINE PERFUSION LUNG SCAN TECHNIQUE: Perfusion images were obtained in multiple projections after intravenous injection of radiopharmaceutical. No ventilation imaging performed. RADIOPHARMACEUTICALS:  4.33 mCi Tc-59m MAA IV COMPARISON:  Chest radiographs 02/11/2024 and 01/19/2024. Lower extremity venous Doppler ultrasound 02/09/2024. Chest CTA 07/24/2021. FINDINGS: There are no wedge-shaped peripheral perfusion defects to suggest pulmonary embolism. 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 IMPRESSION: No evidence of acute pulmonary embolism on perfusion scintigraphy by PISAPED criteria. Electronically Signed   By: Elsie Perone M.D.   On: 02/11/2024 13:11   DG Chest 2 View Result Date: 02/11/2024 CLINICAL DATA:  Shortness of breath. EXAM: CHEST - 2 VIEW COMPARISON:  02/09/2024 and CT chest 07/24/2021. FINDINGS: Trachea is midline. Heart is enlarged, stable. Pacemaker and ICD lead tips are stable in position. Lungs are somewhat low in volume with bibasilar streaky atelectasis, right greater than left. Tiny bilateral pleural  effusions. Mildly elevated right hemidiaphragm. IMPRESSION: Bibasilar atelectasis with tiny bilateral pleural effusions. Electronically Signed   By: Newell  Blietz M.D.   On: 02/11/2024 10:09   US  Venous Img Lower Bilateral Result Date: 02/09/2024 CLINICAL DATA:  Leg swelling EXAM: BILATERAL LOWER EXTREMITY VENOUS DOPPLER ULTRASOUND TECHNIQUE: Gray-scale sonography with compression, as well as color and duplex ultrasound, were performed to evaluate the deep venous system(s) from the level of the common femoral vein through the popliteal and proximal calf veins. COMPARISON:  None Available. FINDINGS: VENOUS Normal compressibility of the common femoral, superficial femoral, and popliteal veins, as well as the visualized calf veins. Visualized portions of profunda femoral vein and great saphenous vein unremarkable. No filling defects to suggest DVT on grayscale or color Doppler imaging. Doppler waveforms show normal direction of venous flow, normal respiratory plasticity and response to augmentation. OTHER Subcutaneous soft tissue edema of bilateral lower legs. Heterogeneously hypoechoic, ovoid structures in the proximal medial thighs without appreciable internal vascularity on color Doppler evaluation measure 4.3 x 2.6 x 1.4 cm on the right and 4.0 x 1.8 x 1.3 cm on the left. Limitations: none IMPRESSION: 1. No evidence of deep venous thrombosis in the bilateral lower extremities. 2. Bilateral lower leg soft tissue edema. 3. Heterogeneously hypoechoic, ovoid structures in the proximal medial thighs without appreciable internal vascularity on color Doppler evaluation measure 4.3 x 2.6 x 1.4 cm on the right and 4.0 x 1.8 x 1.3 cm on the left. These are nonspecific and may represent hematomas, prominent musculature, or other soft tissue mass. Recommend further evaluation with CT or MRI of the thighs. Electronically Signed   By: Limin  Xu M.D.   On: 02/09/2024 12:44   DG Chest Portable 1 View Result Date:  02/09/2024 EXAM: 1 VIEW(S) XRAY OF THE CHEST 02/09/2024 09:24:00 AM COMPARISON: 01/19/2024 CLINICAL HISTORY: sob. Pt c/o SOB for the past couple of months, that has gotten worse the past week or so. States he started taken 80mg  of Lasix  on Thursday once a day and it has not helped. Report some abd distention. Also c/o of tightness in his chest and some cough and ; congestion. Pt has a hx of CHF. Pt is A\T\Ox4, pt labored and tachypneic. FINDINGS: LINES, TUBES AND DEVICES: Stable left AICD with 2 leads. Stable right AICD with 2 leads. LUNGS AND PLEURA: Mild pulmonary edema. Bibasilar atelectasis. Small bilateral pleural effusions. No pneumothorax. HEART AND MEDIASTINUM: Cardiomegaly. CABG markers noted. BONES AND SOFT TISSUES: Sternotomy wires noted. No acute osseous abnormality. IMPRESSION: 1. Mild pulmonary edema with bibasilar atelectasis 2. Small bilateral pleural effusions Electronically signed by: Waddell Calk MD 02/09/2024 09:53 AM EDT RP Workstation: [id number]   US  ASCITES (ABDOMEN LIMITED) Result Date: 01/19/2024 CLINICAL DATA:  Congestive heart failure.  Previous cholecystectomy. EXAM: LIMITED ABDOMEN ULTRASOUND FOR ASCITES TECHNIQUE: Limited ultrasound survey for ascites was performed in all four abdominal quadrants. COMPARISON:  CT abdomen 12/22/2022 FINDINGS: Minimal ascites over the right upper and right lower quadrants with trace free fluid adjacent the spleen in the left upper quadrant. No significant free fluid in the left lower quadrant this free fluid is new since the previous CT of August 2024. IMPRESSION: Minimal ascites as described. Electronically Signed   By: Toribio Agreste M.D.   On: 01/19/2024 15:02   DG Chest 2 View Result Date: 01/19/2024 CLINICAL DATA:  73 year old male with shortness of breath and unintentional weight gain. Chest tightness. EXAM: CHEST - 2 VIEW COMPARISON:  Chest radiographs 09/04/2022 and earlier. FINDINGS: PA and lateral views 0732 hours. Bilateral cardiac  pacemaker/ICD are stable. Prior CABG. Mild cardiomegaly. Stable to mildly improved lung volumes  from last year. No pneumothorax. No pulmonary edema. No convincing pleural effusion or acute lung opacity. Streaky mild infrahilar opacity appears to be chronic, stable since 2023. Hyperostosis related thoracic spinal ankylosis. No acute osseous abnormality identified. Small surgical clips in the visible upper abdomen. Nonobstructed bowel-gas pattern. IMPRESSION: 1. No acute cardiopulmonary abnormality. 2. Chronic cardiac pacemakers/ICD. Stable mild cardiomegaly. Prior CABG. Electronically Signed   By: VEAR Hurst M.D.   On: 01/19/2024 07:52    EKG: Sinus rhythm with right bundle branch block at 94 bpm  Tele reviewed by me: Sinus rhythm LBBB rate 90s  ASSESSMENT AND PLAN:   1.  Acute on chronic HFrEF, LVEF 20-25% 06/28/2022, with 47-month history of worsening exertional dyspnea, peripheral edema, refractory to outpatient diuretic therapy.  BNP 1871.  Patient initially treated with IV furosemide , now on furosemide  drip per nephrology. 2.  CAD, status post CABG x 3 (LIMA-LAD, SVG-ramus, SVG-PDA 02/16/2021) 3.  Borderline elevated troponin, likely demand supply ischemia, in the absence of chest pain, in the setting of acute on chronic HFrEF 4.  CKD stage IV, been seen by nephrology who changed IV furosemide  to furosemide  gtt.  5.  Paroxysmal atrial fibrillation, rate controlled, on Eliquis  for stroke prevention 6.  Status post ICD  Recommendations  1.  Agree with current therapy 2.  Transition to p.o. diuretics per nephrology, appreciate their input. 3.  Continue metoprolol  succinate 12.5 mg daily, spironolactone  12.5 mg daily. Not a candidate for SGLT2i. 4.  Continue Eliquis  for stroke prevention 5.  Defer further cardiac diagnostics at this time  [facility] for discharge today from a cardiac perspective. Will arrange for follow up in clinic with Dr. Ammon in 1-2 weeks.    This patient's plan of care was  discussed and created with Dr. Florencio and he is in agreement.    Signed: Danita Bloch, PA-C  02/13/2024, 10:27 AM [facility] Cardiology

## 2024-02-13 NOTE — Progress Notes (Signed)
 [facility]  ROUNDING NOTE   Subjective:   Patient well-known to us  from the office.  He has known past medical history of atrial fibrillation, hyperlipidemia, diabetes mellitus type 2, chronic systolic heart failure ejection fraction 30 to 35%, obstructive sleep apnea, hypertension, history of CVA, GERD, coronary disease status post CABG, chronic kidney disease stage IIIb who presented with increasing shortness of breath and acute exacerbation of chronic systolic heart failure.   Update: Patient seen sitting at bedside No family present Patient states he has been told he will discharge today  Creatinine 2.43  Objective:  Vital signs in last 24 hours:  Temp:  [97.7 F (36.5 C)-98.3 F (36.8 C)] 97.8 F (36.6 C) (10/15 1122) Pulse Rate:  [61-92] 76 (10/15 1122) Resp:  [16-20] 18 (10/15 1122) BP: (93-102)/(46-77) 93/71 (10/15 1122) SpO2:  [91 %-96 %] 96 % (10/15 1122) Weight:  [81.9 kg] 81.9 kg (10/15 0500)  Weight change: -1.8 kg Filed Weights   02/11/24 0500 02/12/24 0500 02/13/24 0500  Weight: 82 kg 83.7 kg 81.9 kg    Intake/Output: I/O last 3 completed shifts: In: 360 [P.O.:360] Out: 1500 [Urine:1500]   Intake/Output this shift:  Total I/O In: 240 [P.O.:240] Out: 300 [Urine:300]  Physical Exam: General: No acute distress  Head: Normocephalic, atraumatic. Moist oral mucosal membranes  Neck: Supple  Lungs:  Clear, room air  Heart: S1S2 no rubs  Abdomen:  Soft, nontender, bowel sounds present  Extremities: Trace peripheral edema.  Neurologic: Awake, alert, following commands  Skin: No acute rash  Access: No hemodialysis access    Basic Metabolic Panel: Recent Labs  Lab 02/09/24 0921 02/10/24 0315 02/10/24 1041 02/11/24 0419 02/12/24 0341 02/13/24 0432  NA 138 138  --  138 137 136  K 4.3 3.9  --  4.0 4.0 4.1  CL 102 105  --  101 100 99  CO2 26 25  --  26 27 26   GLUCOSE 241* 143*  --  155* 187* 140*  BUN 31* 29*  --  30* 29* 31*   CREATININE 2.33* 2.05*  --  2.01* 2.05* 2.43*  CALCIUM  9.0 8.8*  --  8.8* 8.9 8.7*  MG  --   --  1.5* 1.9  --   --     Liver Function Tests: Recent Labs  Lab 02/10/24 0315  AST 13*  ALT 9  ALKPHOS 56  BILITOT 0.8  PROT 5.9*  ALBUMIN 3.3*   No results for input(s): LIPASE, AMYLASE in the last 168 hours. No results for input(s): AMMONIA in the last 168 hours.  CBC: Recent Labs  Lab 02/09/24 0921 02/10/24 0315 02/11/24 0419 02/12/24 0341 02/13/24 0432  WBC 5.0 5.1 5.1 5.5 4.2  HGB 11.8* 11.0* 11.2* 11.4* 11.5*  HCT 38.3* 34.2* 35.7* 37.1* 37.1*  MCV 84.7 82.4 83.0 83.9 83.4  PLT 155 146* 152 137* 159    Cardiac Enzymes: No results for input(s): CKTOTAL, CKMB, CKMBINDEX, TROPONINI in the last 168 hours.  BNP: Invalid input(s): POCBNP  CBG: Recent Labs  Lab 02/12/24 1709 02/12/24 2053 02/12/24 2153 02/13/24 0723 02/13/24 1122  GLUCAP 230* 163* 155* 146* 175*    Microbiology: Results for orders placed or performed during the hospital encounter of 01/19/24  Resp panel by RT-PCR (RSV, Flu A&B, Covid) Anterior Nasal Swab     Status: None   Collection Time: 01/19/24  7:22 AM   Specimen: Anterior Nasal Swab  Result Value Ref Range Status   SARS Coronavirus 2 by RT PCR  NEGATIVE NEGATIVE Final    Comment: (NOTE) SARS-CoV-2 target nucleic acids are NOT DETECTED.  The SARS-CoV-2 RNA is generally detectable in upper respiratory specimens during the acute phase of infection. The lowest concentration of SARS-CoV-2 viral copies this assay can detect is 138 copies/mL. A negative result does not preclude SARS-Cov-2 infection and should not be used as the sole basis for treatment or other patient management decisions. A negative result may occur with  improper specimen collection/handling, submission of specimen other than nasopharyngeal swab, presence of viral mutation(s) within the areas targeted by this assay, and inadequate number of  viral copies(<138 copies/mL). A negative result must be combined with clinical observations, patient history, and epidemiological information. The expected result is Negative.  Fact Sheet for Patients:  BloggerCourse.com  Fact Sheet for Healthcare Providers:  SeriousBroker.it  This test is no t yet approved or cleared by the [place]  FDA and  has been authorized for detection and/or diagnosis of SARS-CoV-2 by FDA under an Emergency Use Authorization (EUA). This EUA will remain  in effect (meaning this test can be used) for the duration of the COVID-19 declaration under Section 564(b)(1) of the Act, 21 U.S.C.section 360bbb-3(b)(1), unless the authorization is terminated  or revoked sooner.       Influenza A by PCR NEGATIVE NEGATIVE Final   Influenza B by PCR NEGATIVE NEGATIVE Final    Comment: (NOTE) The Xpert Xpress SARS-CoV-2/FLU/RSV plus assay is intended as an aid in the diagnosis of influenza from Nasopharyngeal swab specimens and should not be used as a sole basis for treatment. Nasal washings and aspirates are unacceptable for Xpert Xpress SARS-CoV-2/FLU/RSV testing.  Fact Sheet for Patients: BloggerCourse.com  Fact Sheet for Healthcare Providers: SeriousBroker.it  This test is not yet approved or cleared by the [place]  FDA and has been authorized for detection and/or diagnosis of SARS-CoV-2 by FDA under an Emergency Use Authorization (EUA). This EUA will remain in effect (meaning this test can be used) for the duration of the COVID-19 declaration under Section 564(b)(1) of the Act, 21 U.S.C. section 360bbb-3(b)(1), unless the authorization is terminated or revoked.     Resp Syncytial Virus by PCR NEGATIVE NEGATIVE Final    Comment: (NOTE) Fact Sheet for Patients: BloggerCourse.com  Fact Sheet for Healthcare  Providers: SeriousBroker.it  This test is not yet approved or cleared by the [place]  FDA and has been authorized for detection and/or diagnosis of SARS-CoV-2 by FDA under an Emergency Use Authorization (EUA). This EUA will remain in effect (meaning this test can be used) for the duration of the COVID-19 declaration under Section 564(b)(1) of the Act, 21 U.S.C. section 360bbb-3(b)(1), unless the authorization is terminated or revoked.  Performed at [facility], [address] [place], [place] [address]     Coagulation Studies: No results for input(s): LABPROT, INR in the last 72 hours.   Urinalysis: No results for input(s): COLORURINE, LABSPEC, PHURINE, GLUCOSEU, HGBUR, BILIRUBINUR, KETONESUR, PROTEINUR, UROBILINOGEN, NITRITE, LEUKOCYTESUR in the last 72 hours.  Invalid input(s): APPERANCEUR    Imaging: NM Pulmonary Perfusion Result Date: 02/11/2024 CLINICAL DATA:  Pulmonary embolism (PE) suspected, low to intermediate prob, positive D-dimer History of atrial fibrillation, congestive heart failure and hypertension. EXAM: NUCLEAR MEDICINE PERFUSION LUNG SCAN TECHNIQUE: Perfusion images were obtained in multiple projections after intravenous injection of radiopharmaceutical. No ventilation imaging performed. RADIOPHARMACEUTICALS:  4.33 mCi Tc-60m MAA IV COMPARISON:  Chest radiographs 02/11/2024 and 01/19/2024. Lower extremity venous Doppler ultrasound 02/09/2024. Chest CTA 07/24/2021. FINDINGS: There are no wedge-shaped  peripheral perfusion defects to suggest pulmonary embolism. 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 IMPRESSION: No evidence of acute pulmonary embolism on perfusion scintigraphy by PISAPED criteria. Electronically Signed   By: Elsie Perone M.D.   On: 02/11/2024 13:11     Medications:      apixaban   5 mg Oral BID    atorvastatin   80 mg Oral Daily   calcitRIOL  0.25 mcg Oral Daily   insulin  aspart  0-5 Units Subcutaneous QHS   insulin  aspart  0-9 Units Subcutaneous TID WC   metoprolol  succinate  12.5 mg Oral QPM   midodrine   2.5 mg Oral BID WC   pantoprazole   80 mg Oral BID   spironolactone   12.5 mg Oral Daily   tamsulosin   0.4 mg Oral QPC breakfast   torsemide   40 mg Oral Daily   acetaminophen  **OR** acetaminophen , hydrALAZINE , ipratropium-albuterol , melatonin, ondansetron  **OR** ondansetron  (ZOFRAN ) IV, oxyCODONE , polyethylene glycol  Assessment/ Plan:  73 y.o. male with past medical history of atrial fibrillation, hyperlipidemia, diabetes mellitus type 2 with chronic kidney disease, chronic systolic heart failure ejection fraction 20 to 25%, obstructive sleep apnea, hypertension, history of CVA, GERD, coronary disease status post CABG who returns now with shortness of breath and acute exacerbation of chronic systolic heart failure.  1.  Acute kidney injury/chronic kidney disease stage IIIb likely secondary to cardiorenal syndrome.  Recent baseline creatinine appears to be 1.9.    Creatinine r slightly elevated today however patient transition to oral diuretics yesterday.  Patient will follow-up with our office in 1 to 2 weeks with labs to monitor renal function.  2.  Acute on chronic systolic heart failure exacerbation.  Ejection fraction 20 to 25% and quite low at baseline.  Continue orsemide 40 mg daily at discharge.  3.  Secondary hyperparathyroidism.  Maintain the patient Calcitrol 0.25 mcg p.o. daily.  Calcium  is acceptable, 8.7    LOS: 4 [place] 10/15/202512:53 PM

## 2024-02-15 ENCOUNTER — Telehealth: Payer: Self-pay

## 2024-02-15 NOTE — Telephone Encounter (Signed)
 Cannot leave voicemail to confirm appt 02/18/24

## 2024-02-17 NOTE — Discharge Summary (Addendum)
 Physician Discharge Summary   Patient: Jermaine Kramer MRN: [id number] DOB: 1950-06-10  Admit date:     02/09/2024  Discharge date: 02/13/2024  Discharge Physician: Alban Pepper   PCP: Perri Constance Sor, PA-C   Recommendations at discharge:    F/u BMP to ensure serum creatinine stable.  Work up normocytic anemia outpatient   Discharge Diagnoses: Principal Problem:   CHF (congestive heart failure) (HCC) Active Problems:   AKI (acute kidney injury) with metabolic acidosis(HCC)   Hypotension   GERD (gastroesophageal reflux disease)   Hyperlipidemia LDL goal <70   Dilated cardiomyopathy (HCC)   CAD s/p CABG x 3   Chronic combined systolic and diastolic congestive heart failure (HCC)   DM2 (diabetes mellitus, type 2) (HCC)  Resolved Problems:   DM (diabetes mellitus), type 2 with coma [name])  Hospital Course: [name] is a 73 year old male with history of CAD, hyperlipidemia, paroxysmal atrial fibrillation on Eliquis , hypertension, GERD, non-insulin -dependent diabetes mellitus, CKD stage IIIb, who presents emergency department for chief concerns of shortness of breath for 1 week.  02/09/24: Patient was admitted to hospital service for heart failure exacerbation.  02/10/2024: Nephrology has been consulted and initiated furosemide  infusion.  Continue strict I's and O's.  02/11/2024: Continue furosemide  infusion, strict I's and O's.  Nephrology and cardiology have been consulted.  Continue close monitoring of renal function.  sCr 3 weeks ago was 1.96-1.98/eGFR 35.  02/12/2024: Furosemide  IV will be discontinued with transition to p.o. diuretic.  Cardiology agreed nothing from a cardiology standpoint.   Assessment and Plan: * Acute on chronic HFrEF (HCC) Patient baseline weight is 180 pounds, and on admission he was 187 He was diuresed on Lasix  infusion, then IV lasix  40 mg twice daily.  With good response.  His discharge weight was back to his baseline. He was  transitioned to torsemide  40 mg daily.  He was continued on home spironolactone  12.5mg  and metoprolol  succinate 12-1/2 mg.   AKI on CKD 3B Metabolic acidosis present on arrival, resolved Suspect cardiorenal, nephrology has been consulted at patient's request Initially improved with diuresis.  Discharge serum creatinine of 2.43.  Patient was transition to oral diuretic and will have follow-up for continued monitoring of his renal function.  Hypotension Home midodrine  2.5 mg p.o. twice daily with meals  Hyperlipidemia LDL goal <70 Atorvastatin  80 mg daily  GERD (gastroesophageal reflux disease) Home PPI equivalent twice daily resumed  DM2 (diabetes mellitus, type 2) (HCC) Non-insulin  diabetes mellitus type 2 01/19/2024: A1c 7.1% Home metformin  resumed on discharge  Mild normocytic anemia  Hgb stable.     Latest Ref Rng & Units 02/13/2024    4:32 AM 02/12/2024    3:41 AM 02/11/2024    4:19 AM  CBC  WBC 4.0 - 10.5 K/uL 4.2  5.5  5.1   Hemoglobin 13.0 - 17.0 g/dL 88.4  88.5  88.7   Hematocrit 39.0 - 52.0 % 37.1  37.1  35.7   Platelets 150 - 400 K/uL 159  137  152    Likely multifactorial in setting of CKD.  Will need further work up outpatient.   CVA CAD s/p CABG Statin and eliquis        Consultants: Nephrology, Cardiology  Procedures performed: None  Disposition: Home Diet recommendation:  Discharge Diet Orders (From admission, onward)     Start     Ordered   02/13/24 0000  Diet - low sodium heart healthy        02/13/24 1112   02/13/24 0000  Diet Carb Modified        02/13/24 1112           Cardiac diet DISCHARGE MEDICATION: Allergies as of 02/13/2024       Reactions   Doxycycline Anaphylaxis   Patient does not recall having this reaction.    Heparin Other (See Comments)   heparin-induced thrombocytopenia   Empagliflozin Itching   Groin infection Caused a groin infection.         Medication List     PAUSE taking these medications     Furoscix  80 MG/10ML Ctkt Wait to take this until your doctor or other care provider tells you to start again. Generic drug: Furosemide  Take 80mg  daily as directed.   potassium chloride  SA 20 MEQ tablet Wait to take this until your doctor or other care provider tells you to start again. Commonly known as: KLOR-CON  M Take 2 tablets (40 mEq total) by mouth as directed. By heart failure clinic       TAKE these medications    albuterol  108 (90 Base) MCG/ACT inhaler Commonly known as: VENTOLIN  HFA Inhale 2 puffs into the lungs every 6 (six) hours as needed for wheezing or shortness of breath.   apixaban  5 MG Tabs tablet Commonly known as: ELIQUIS  Take 1 tablet (5 mg total) by mouth 2 (two) times daily.   atorvastatin  80 MG tablet Commonly known as: LIPITOR  Take 1 tablet (80 mg total) by mouth daily.   calcitRIOL 0.25 MCG capsule Commonly known as: ROCALTROL Take 0.25 mcg by mouth.   Comfort EZ Pen Needles 32G X 8 MM Misc Generic drug: Insulin  Pen Needle by Other route.   FreeStyle Libre 2 Sensor Misc by Does not apply route.   metFORMIN  500 MG tablet Commonly known as: GLUCOPHAGE  Take 500 mg by mouth 2 (two) times daily.   metolazone  2.5 MG tablet Commonly known as: ZAROXOLYN  Take 1 tablet (2.5 mg total) by mouth daily as needed (worsening edema, SOB).   metoprolol  succinate 25 MG 24 hr tablet Commonly known as: TOPROL -XL TAKE 1/2 TABLET BY MOUTH EVERY EVENING   midodrine  2.5 MG tablet Commonly known as: PROAMATINE  Take 1 tablet (2.5 mg total) by mouth 2 (two) times daily with a meal.   omeprazole 40 MG capsule Commonly known as: PRILOSEC Take 40 mg by mouth 2 (two) times daily.   ondansetron  4 MG disintegrating tablet Commonly known as: ZOFRAN -ODT Take 1 tablet (4 mg total) by mouth every 8 (eight) hours as needed for nausea or vomiting.   spironolactone  25 MG tablet Commonly known as: ALDACTONE  Take 0.5 tablets (12.5 mg total) by mouth daily.    tamsulosin  0.4 MG Caps capsule Commonly known as: FLOMAX  Take 1 capsule (0.4 mg total) by mouth daily after supper. What changed: when to take this   torsemide  20 MG tablet Commonly known as: DEMADEX  Take 2 tablets (40 mg total) by mouth daily. What changed: how much to take        Follow-up Information     Clarisa Kung, [place]. Go in 1 month(s).   Contact information: [address] [facility] [place] [address] [phone]         [facility]. Go on 02/18/2024.   Specialty: Cardiology Why: Hospital Follow-Up 02/18/24 @ 12:00 Please bring all medications to follow-up appointment with you. [facility], [address], [employer] parking at the door. Contact information: [address] [place]   [address] [phone]               Discharge Exam: Filed Weights   02/11/24 0500 02/12/24 0500 02/13/24 0500  Weight: 82 kg 83.7 kg 81.9 kg   Physical Exam  Constitutional: In no distress.  Cardiovascular: Normal rate, regular rhythm. Trace lower extremity edema  Pulmonary: Non labored breathing on room air, no wheezing or rales.   Abdominal: Soft. Non distended and non tender Musculoskeletal: Normal range of motion.     Neurological: Alert and oriented to person, place, and time. Non focal  Skin: Skin is warm and dry.    Condition at discharge: good  The results of significant diagnostics from this hospitalization (including imaging, microbiology, ancillary and laboratory) are listed below for reference.   Imaging Studies: NM Pulmonary Perfusion Result Date: 02/11/2024 CLINICAL DATA:  Pulmonary embolism (PE) suspected, low to intermediate prob, positive D-dimer History of atrial fibrillation, congestive heart failure and hypertension. EXAM: NUCLEAR MEDICINE PERFUSION LUNG SCAN TECHNIQUE: Perfusion images were obtained in multiple projections after  intravenous injection of radiopharmaceutical. No ventilation imaging performed. RADIOPHARMACEUTICALS:  4.33 mCi Tc-68m MAA IV COMPARISON:  Chest radiographs 02/11/2024 and 01/19/2024. Lower extremity venous Doppler ultrasound 02/09/2024. Chest CTA 07/24/2021. FINDINGS: There are no wedge-shaped peripheral perfusion defects to suggest pulmonary embolism. 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 IMPRESSION: No evidence of acute pulmonary embolism on perfusion scintigraphy by PISAPED criteria. Electronically Signed   By: Elsie Perone M.D.   On: 02/11/2024 13:11   DG Chest 2 View Result Date: 02/11/2024 CLINICAL DATA:  Shortness of breath. EXAM: CHEST - 2 VIEW COMPARISON:  02/09/2024 and CT chest 07/24/2021. FINDINGS: Trachea is midline. Heart is enlarged, stable. Pacemaker and ICD lead tips are stable in position. Lungs are somewhat low in volume with bibasilar streaky atelectasis, right greater than left. Tiny bilateral pleural effusions. Mildly elevated right hemidiaphragm. IMPRESSION: Bibasilar atelectasis with tiny bilateral pleural effusions. Electronically Signed   By: Newell Eke M.D.   On: 02/11/2024 10:09   US  Venous Img Lower Bilateral Result Date: 02/09/2024 CLINICAL DATA:  Leg swelling EXAM: BILATERAL LOWER EXTREMITY VENOUS DOPPLER ULTRASOUND TECHNIQUE: Gray-scale sonography with compression, as well as color and duplex ultrasound, were performed to evaluate the deep venous system(s) from the level of the common femoral vein through the popliteal and proximal calf veins. COMPARISON:  None Available. FINDINGS: VENOUS Normal compressibility of the common femoral, superficial femoral, and popliteal veins, as well as the visualized calf veins. Visualized portions of profunda femoral vein and great saphenous vein unremarkable. No filling defects to suggest DVT on grayscale or color Doppler imaging. Doppler waveforms  show normal direction of venous flow, normal respiratory plasticity and response to augmentation. OTHER Subcutaneous soft tissue edema of bilateral lower legs. Heterogeneously hypoechoic, ovoid structures in the proximal medial thighs without appreciable internal vascularity on color Doppler evaluation measure 4.3 x 2.6 x 1.4 cm on the right and 4.0 x 1.8 x 1.3 cm on the left. Limitations: none IMPRESSION: 1. No evidence of deep venous thrombosis in the bilateral lower extremities. 2. Bilateral lower leg soft tissue edema. 3. Heterogeneously hypoechoic, ovoid structures in the proximal medial thighs without appreciable internal vascularity on color Doppler evaluation measure 4.3 x 2.6 x 1.4 cm on the right and 4.0 x 1.8 x 1.3 cm on the left. These are nonspecific and may represent hematomas, prominent musculature, or other soft tissue mass. Recommend further evaluation with CT  or MRI of the thighs. Electronically Signed   By: Limin  Xu M.D.   On: 02/09/2024 12:44   DG Chest Portable 1 View Result Date: 02/09/2024 EXAM: 1 VIEW(S) XRAY OF THE CHEST 02/09/2024 09:24:00 AM COMPARISON: 01/19/2024 CLINICAL HISTORY: sob. Pt c/o SOB for the past couple of months, that has gotten worse the past week or so. States he started taken 80mg  of Lasix  on Thursday once a day and it has not helped. Report some abd distention. Also c/o of tightness in his chest and some cough and ; congestion. Pt has a hx of CHF. Pt is A\T\Ox4, pt labored and tachypneic. FINDINGS: LINES, TUBES AND DEVICES: Stable left AICD with 2 leads. Stable right AICD with 2 leads. LUNGS AND PLEURA: Mild pulmonary edema. Bibasilar atelectasis. Small bilateral pleural effusions. No pneumothorax. HEART AND MEDIASTINUM: Cardiomegaly. CABG markers noted. BONES AND SOFT TISSUES: Sternotomy wires noted. No acute osseous abnormality. IMPRESSION: 1. Mild pulmonary edema with bibasilar atelectasis 2. Small bilateral pleural effusions Electronically signed by: Waddell Calk MD 02/09/2024 09:53 AM EDT RP Workstation: [id number]   US  ASCITES (ABDOMEN LIMITED) Result Date: 01/19/2024 CLINICAL DATA:  Congestive heart failure.  Previous cholecystectomy. EXAM: LIMITED ABDOMEN ULTRASOUND FOR ASCITES TECHNIQUE: Limited ultrasound survey for ascites was performed in all four abdominal quadrants. COMPARISON:  CT abdomen 12/22/2022 FINDINGS: Minimal ascites over the right upper and right lower quadrants with trace free fluid adjacent the spleen in the left upper quadrant. No significant free fluid in the left lower quadrant this free fluid is new since the previous CT of August 2024. IMPRESSION: Minimal ascites as described. Electronically Signed   By: Toribio Agreste M.D.   On: 01/19/2024 15:02   DG Chest 2 View Result Date: 01/19/2024 CLINICAL DATA:  73 year old male with shortness of breath and unintentional weight gain. Chest tightness. EXAM: CHEST - 2 VIEW COMPARISON:  Chest radiographs 09/04/2022 and earlier. FINDINGS: PA and lateral views 0732 hours. Bilateral cardiac pacemaker/ICD are stable. Prior CABG. Mild cardiomegaly. Stable to mildly improved lung volumes from last year. No pneumothorax. No pulmonary edema. No convincing pleural effusion or acute lung opacity. Streaky mild infrahilar opacity appears to be chronic, stable since 2023. Hyperostosis related thoracic spinal ankylosis. No acute osseous abnormality identified. Small surgical clips in the visible upper abdomen. Nonobstructed bowel-gas pattern. IMPRESSION: 1. No acute cardiopulmonary abnormality. 2. Chronic cardiac pacemakers/ICD. Stable mild cardiomegaly. Prior CABG. Electronically Signed   By: VEAR Hurst M.D.   On: 01/19/2024 07:52    Microbiology: Results for orders placed or performed during the hospital encounter of 01/19/24  Resp panel by RT-PCR (RSV, Flu A&B, Covid) Anterior Nasal Swab     Status: None   Collection Time: 01/19/24  7:22 AM   Specimen: Anterior Nasal Swab  Result Value Ref Range Status    SARS Coronavirus 2 by RT PCR NEGATIVE NEGATIVE Final    Comment: (NOTE) SARS-CoV-2 target nucleic acids are NOT DETECTED.  The SARS-CoV-2 RNA is generally detectable in upper respiratory specimens during the acute phase of infection. The lowest concentration of SARS-CoV-2 viral copies this assay can detect is 138 copies/mL. A negative result does not preclude SARS-Cov-2 infection and should not be used as the sole basis for treatment or other patient management decisions. A negative result may occur with  improper specimen collection/handling, submission of specimen other than nasopharyngeal swab, presence of viral mutation(s) within the areas targeted by this assay, and inadequate number of viral copies(<138 copies/mL). A negative result must be combined with  clinical observations, patient history, and epidemiological information. The expected result is Negative.  Fact Sheet for Patients:  BloggerCourse.com  Fact Sheet for Healthcare Providers:  SeriousBroker.it  This test is no t yet approved or cleared by the [place]  FDA and  has been authorized for detection and/or diagnosis of SARS-CoV-2 by FDA under an Emergency Use Authorization (EUA). This EUA will remain  in effect (meaning this test can be used) for the duration of the COVID-19 declaration under Section 564(b)(1) of the Act, 21 U.S.C.section 360bbb-3(b)(1), unless the authorization is terminated  or revoked sooner.       Influenza A by PCR NEGATIVE NEGATIVE Final   Influenza B by PCR NEGATIVE NEGATIVE Final    Comment: (NOTE) The Xpert Xpress SARS-CoV-2/FLU/RSV plus assay is intended as an aid in the diagnosis of influenza from Nasopharyngeal swab specimens and should not be used as a sole basis for treatment. Nasal washings and aspirates are unacceptable for Xpert Xpress SARS-CoV-2/FLU/RSV testing.  Fact Sheet for  Patients: BloggerCourse.com  Fact Sheet for Healthcare Providers: SeriousBroker.it  This test is not yet approved or cleared by the [place]  FDA and has been authorized for detection and/or diagnosis of SARS-CoV-2 by FDA under an Emergency Use Authorization (EUA). This EUA will remain in effect (meaning this test can be used) for the duration of the COVID-19 declaration under Section 564(b)(1) of the Act, 21 U.S.C. section 360bbb-3(b)(1), unless the authorization is terminated or revoked.     Resp Syncytial Virus by PCR NEGATIVE NEGATIVE Final    Comment: (NOTE) Fact Sheet for Patients: BloggerCourse.com  Fact Sheet for Healthcare Providers: SeriousBroker.it  This test is not yet approved or cleared by the [place]  FDA and has been authorized for detection and/or diagnosis of SARS-CoV-2 by FDA under an Emergency Use Authorization (EUA). This EUA will remain in effect (meaning this test can be used) for the duration of the COVID-19 declaration under Section 564(b)(1) of the Act, 21 U.S.C. section 360bbb-3(b)(1), unless the authorization is terminated or revoked.  Performed at [facility], [address] [place], [place] [address]     Labs: CBC: Recent Labs  Lab 02/11/24 0419 02/12/24 0341 02/13/24 0432  WBC 5.1 5.5 4.2  HGB 11.2* 11.4* 11.5*  HCT 35.7* 37.1* 37.1*  MCV 83.0 83.9 83.4  PLT 152 137* 159   Basic Metabolic Panel: Recent Labs  Lab 02/10/24 1041 02/11/24 0419 02/12/24 0341 02/13/24 0432  NA  --  138 137 136  K  --  4.0 4.0 4.1  CL  --  101 100 99  CO2  --  26 27 26   GLUCOSE  --  155* 187* 140*  BUN  --  30* 29* 31*  CREATININE  --  2.01* 2.05* 2.43*  CALCIUM   --  8.8* 8.9 8.7*  MG 1.5* 1.9  --   --    Liver Function Tests: No results for input(s): AST, ALT, ALKPHOS, BILITOT, PROT, ALBUMIN in the last 168  hours. CBG: Recent Labs  Lab 02/12/24 1709 02/12/24 2053 02/12/24 2153 02/13/24 0723 02/13/24 1122  GLUCAP 230* 163* 155* 146* 175*    Discharge time spent: greater than 30 minutes.  Signed: Alban Pepper, MD [facility] 02/17/2024

## 2024-02-18 ENCOUNTER — Inpatient Hospital Stay (HOSPITAL_BASED_OUTPATIENT_CLINIC_OR_DEPARTMENT_OTHER): Admitting: Adult Health

## 2024-02-18 VITALS — BP 91/66 | HR 87 | Ht 65.0 in | Wt 188.0 lb

## 2024-02-18 DIAGNOSIS — I480 Paroxysmal atrial fibrillation: Secondary | ICD-10-CM

## 2024-02-18 DIAGNOSIS — N1832 Chronic kidney disease, stage 3b: Secondary | ICD-10-CM

## 2024-02-18 DIAGNOSIS — I5022 Chronic systolic (congestive) heart failure: Secondary | ICD-10-CM

## 2024-02-18 DIAGNOSIS — I959 Hypotension, unspecified: Secondary | ICD-10-CM

## 2024-02-18 DIAGNOSIS — I2510 Atherosclerotic heart disease of native coronary artery without angina pectoris: Secondary | ICD-10-CM | POA: Diagnosis not present

## 2024-02-18 MED ORDER — MIDODRINE HCL 5 MG PO TABS: 5.0000 mg | ORAL_TABLET | Freq: Two times a day (BID) | ORAL | 3 refills | Status: DC

## 2024-02-18 MED ORDER — TORSEMIDE 20 MG PO TABS: 60.0000 mg | ORAL_TABLET | Freq: Every day | ORAL | 3 refills | Status: DC

## 2024-02-18 NOTE — Progress Notes (Signed)
 Advanced Heart Failure Clinic Note    PCP: Perri Constance Sor, PA-C  Primary Cardiologist: Ammon Blunt, MD/ Clarisa Kung, [place]   Chief Complaint:  [facility] HF Follow UP   HPI: Mr Jermaine Kramer is a 73 y/o male with a history of leukemia, DM, mesenteric panniculitis, CAD (CABG 02/16/21), AF, sleep apnea, hyperlipidemia, HTN, stroke, GERD & chronic heart failure. Had CCM generator with A/V lead insertion on 09/04/22.  Admitted 06/09/22 due to LLQ abdominal pain. CT showed mesenteric pancolitis.   Was in the ED 04/05/23 due to weakness thought to be due to ozempic use. Ozempic stopped.   Was in the ED 04/16/23 due to hypotension. SBP in the 70's. Has had decreased fluid intake since ozempic usage which was stopped 2 weeks prior. Oral intake improving but still with nausea. Labs show no significant anemia, leukocytosis, electrolyte abnormality, or AKI. EKG shows no evidence of arrhythmia or ischemia.   Seen in [facility] 12/03/23. Volume overloaded. Given furoscix  X 2 days. Patient called the office the following day down 3 pounds and feeling much better.   Admitted 01/19/24 with worsening SOB/ pedal edema over the preceding week even after increasing home diuretics. IV diuresed. Cardiology consulted.   At last visit 01/25/24, he was instructed to use 1 furoscix  and can use 2nd one if needed. Losartan  stopped and midodrine  started due to hypotension.   Admitted 02/09/2024 with A/C HFrEF. Diuresed with lasix  drip. Transitioned to torsemide  40 mg daily (previous home dose of torsemide ).. Creatinine on the day discharge was 2.4. Discharged on 02/13/2024   Today he returns for post hospital heart failure follow up with his wife. Initially felt pretty good when he left the hospital but has started feeling worse aagin. Complaining of difficulty sleeping and shortness of breath with exertion. +Orthopnea.  Denies PND. Appetite poor.  SBP at home 90-100s. No fever or chills. Weight at home 180--->184  pounds.  Taking all medications. He can't afford Furoscix   co pay.   Previous cardiac studies:  LHC 02/10/21:  2v CAD, including 100% midRCA, 70% midLAD with 80% focal distal LAD, 70% D1  Elevated LVEDP ( )   Cardiac MRI 01/27/21:  1. The left ventricle is mildly dilated.  Wall thickness is normal.     a.  The inferior wall is akinetic.     b.  The basal septum is severely hypokinetic.  The mid-distal septum is moderately hypokinetic.     c.  The basal anterior wall is severely hypokinetic. The mid-distal anterior wall and apex are  moderately hypokinetic.     d.  The lateral wall is moderately. hypokinetic.   Global LV systolic function is severely reduced with an LVEF calculated at 30%.    2. The right ventricle is normal in cavity size and wall thickness.  There is mild global hypokinesis with  mildly reduced RV systolic function.  The RVEF is calculated at 43%.    Echo 05/16/21: EF <20% along with mild/moderate LAE and mild/moderate MR.  Echo 09/16/21: EF 30-35% along with mild MR. Echo 06/29/22: EF 25% with moderate MR ROS: All systems negative except as listed in HPI, PMH and Problem List.  SH:  Social History   Socioeconomic History   Marital status: Married    Spouse name: Not on file   Number of children: Not on file   Years of education: Not on file   Highest education level: Not on file  Occupational History   Not on file  Tobacco Use  Smoking status: Never   Smokeless tobacco: Never  Vaping Use   Vaping status: Never Used  Substance and Sexual Activity   Alcohol use: Not Currently   Drug use: No   Sexual activity: Not Currently  Other Topics Concern   Not on file  Social History Narrative   Not on file   Social Drivers of Health   Financial Resource Strain: Low Risk  (10/24/2023)   Received from [facility]   Overall Financial Resource Strain (CARDIA)    Difficulty of Paying Living Expenses: Not hard at all  Food Insecurity: No Food  Insecurity (02/10/2024)   Hunger Vital Sign    Worried About Running Out of Food in the Last Year: Never true    Ran Out of Food in the Last Year: Never true  Transportation Needs: No Transportation Needs (02/10/2024)   PRAPARE - Administrator, Civil Service (Medical): No    Lack of Transportation (Non-Medical): No  Physical Activity: Not on file  Stress: Not on file  Social Connections: Moderately Integrated (02/10/2024)   Social Connection and Isolation Panel    Frequency of Communication with Friends and Family: More than three times a week    Frequency of Social Gatherings with Friends and Family: More than three times a week    Attends Religious Services: More than 4 times per year    Active Member of [employer] or Organizations: No    Attends Banker Meetings: Never    Marital Status: Married  Catering manager Violence: Not At Risk (02/10/2024)   Humiliation, Afraid, Rape, and Kick questionnaire    Fear of Current or Ex-Partner: No    Emotionally Abused: No    Physically Abused: No    Sexually Abused: No    FH:  Family History  Problem Relation Age of Onset   Parkinson's disease Mother    Lupus Mother    Heart disease Father     Past Medical History:  Diagnosis Date   Arrhythmia    atrial fibrillation   CHF (congestive heart failure) (HCC)    Coronary artery disease    Diabetes mellitus without complication (HCC)    GERD (gastroesophageal reflux disease)    Hypercholesteremia    Hypertension    Sleep apnea    Stroke (HCC)    2015 or longer ago    Current Outpatient Medications  Medication Sig Dispense Refill   albuterol  (VENTOLIN  HFA) 108 (90 Base) MCG/ACT inhaler Inhale 2 puffs into the lungs every 6 (six) hours as needed for wheezing or shortness of breath. 8.5 g 2   apixaban  (ELIQUIS ) 5 MG TABS tablet Take 1 tablet (5 mg total) by mouth 2 (two) times daily. 180 tablet 3   atorvastatin  (LIPITOR ) 80 MG tablet Take 1 tablet (80 mg total)  by mouth daily. 30 tablet 5   calcitRIOL (ROCALTROL) 0.25 MCG capsule Take 0.25 mcg by mouth.     Continuous Glucose Sensor (FREESTYLE LIBRE 2 SENSOR) MISC by Does not apply route.     Insulin  Pen Needle (COMFORT EZ PEN NEEDLES) 32G X 8 MM MISC by Other route.     metFORMIN  (GLUCOPHAGE ) 500 MG tablet Take 500 mg by mouth 2 (two) times daily.     metolazone  (ZAROXOLYN ) 2.5 MG tablet Take 1 tablet (2.5 mg total) by mouth daily as needed (worsening edema, SOB). 30 tablet 0   metoprolol  succinate (TOPROL -XL) 25 MG 24 hr tablet TAKE 1/2 TABLET BY MOUTH EVERY EVENING 45  tablet 3   midodrine  (PROAMATINE ) 2.5 MG tablet Take 1 tablet (2.5 mg total) by mouth 2 (two) times daily with a meal. 60 tablet 5   omeprazole (PRILOSEC) 40 MG capsule Take 40 mg by mouth 2 (two) times daily.     ondansetron  (ZOFRAN -ODT) 4 MG disintegrating tablet Take 1 tablet (4 mg total) by mouth every 8 (eight) hours as needed for nausea or vomiting. 12 tablet 0   spironolactone  (ALDACTONE ) 25 MG tablet Take 0.5 tablets (12.5 mg total) by mouth daily.     tamsulosin  (FLOMAX ) 0.4 MG CAPS capsule Take 1 capsule (0.4 mg total) by mouth daily after supper. (Patient taking differently: Take 0.4 mg by mouth in the morning and at bedtime.)     torsemide  (DEMADEX ) 20 MG tablet Take 2 tablets (40 mg total) by mouth daily.     [Paused] Furosemide  (FUROSCIX ) 80 MG/10ML CTKT Take 80mg  daily as directed. (Patient not taking: Reported on 02/18/2024) 5 each 0   [Paused] potassium chloride  SA (KLOR-CON  M) 20 MEQ tablet Take 2 tablets (40 mEq total) by mouth as directed. By heart failure clinic (Patient not taking: Reported on 02/18/2024) 60 tablet 0   No current facility-administered medications for this visit.   Vitals:   02/18/24 1135  BP: 91/66  Pulse: 87  SpO2: 95%  Weight: 188 lb (85.3 kg)    Wt Readings from Last 3 Encounters:  02/18/24 188 lb (85.3 kg)  02/13/24 180 lb 8.9 oz (81.9 kg)  02/07/24 187 lb (84.8 kg)   Lab Results   Component Value Date   CREATININE 2.43 (H) 02/13/2024   CREATININE 2.05 (H) 02/12/2024   CREATININE 2.01 (H) 02/11/2024    PHYSICAL EXAM: General:   No resp difficulty Neck: JVP 11-12 Cor: Regular rate & rhythm.  Lungs: clear Abdomen: soft, nontender, distended.  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  Echo (5/23) with EF 30-35%, severe LV dilation, mild MR, low normal RV systolic function. He has a Medtronic ICD.   - Echo 05/16/21: EF <20% along with mild/moderate LAE and mild/moderate MR.  - Echo 09/16/21: EF 30-35% along with mild MR. - Echo 06/29/22: EF 25% with moderate MR.   - Echo needs to be updated.  Discharged on 02/13/24 after being decompensated HF admission and now re-accumulating fluid. Optivol reviewed. Impedance trending back down. On exam he is volume overloaded. Will need to adjust oral diuretics. We discussed possible RHC to further assess hemodynamics if he remains difficult to manage.  ReDs reading: 39 %, abnormal NYHA III. Volume status trending up. Increase torsemide  60 mg daily. May need to add weekly metolazone  if volume status remains elevated.  - GDMT limited by hypotension and CKD Stage IIIb - continue metoprolol  succinate 12.5 mg daily - continue spironolactone  12.5mg  daily . He is getting lab work tomorrow for Nephrology appointment.  - allergic to empagliflozin with a yeast infection - cardiac contractility modulator placed 05/24  - had AICD implanted 06/22/21; no shocks have been delivered -Of note he can not afford furoscix  copay.   2: Hypotension  - Increase midodrine  5 mg twice a day. Keep SBP >100  3: DM with CKD Stage IIIb - A1c 01/19/24 was 7.1%  4: Atrial fibrillation- - continue metoprolol  succinate 12.5mg  daily - continue apixaban  5mg  BID - no bleeding issues.    5: CAD- - CABG  with a LIMA  to LAD, SVG to ramus, and SVG to PDA on 02/16/21 - LHC 02/10/21: 2v CAD, including 100% midRCA, 70% midLAD with 80% focal distal LAD, 70% D1 elevated LVEDP ( )  - continue atorvastatin  80 mg daily.  - no ASA given stable CAD and apixaban  use -No chest pain.   6. CKD Stage IIIb -Creatinine baseline ~ 2.2 -He has follow up with Nephrology next week. Asked to fax lab results.   Follow up in later this week to reassess volume status. May need to add weekly metolazone . He remains at high risk for readmission due to diuretic resistance.     Greig Mosses, NP 02/18/24

## 2024-02-18 NOTE — Patient Instructions (Addendum)
 Medication Changes:  INCREASE Midodrine  to 5mg  (1 tab) two times daily  INCREASE Torsemide  to 60mg  (3 tabs) daily  TAKE AN EXTRA Torsemide  20mg  today   Follow-Up in: Please follow up with the Advanced Heart Failure Clinic on Thursday 02/21/24 with Ellouise Class, FNP.   Thank you for choosing [facility] Advanced Heart Failure Clinic.    At the Advanced Heart Failure Clinic, you and your health needs are our priority. We have a designated team specialized in the treatment of Heart Failure. This Care Team includes your primary Heart Failure Specialized Cardiologist (physician), Advanced Practice Providers (APPs- Physician Assistants and Nurse Practitioners), and Pharmacist who all work together to provide you with the care you need, when you need it.   You may see any of the following providers on your designated Care Team at your next follow up:  Dr. Toribio Fuel Dr. Ezra Shuck Dr. Ria Commander Dr. Morene Brownie Ellouise Class, FNP Jaun Bash, RPH-CPP  Please be sure to bring in all your medications bottles to every appointment.   Need to Contact Us :  If you have any questions or concerns before your next appointment please send us  a message through [place] or call our office at [phone].    TO LEAVE A MESSAGE FOR THE NURSE SELECT OPTION 2, PLEASE LEAVE A MESSAGE INCLUDING: YOUR NAME DATE OF BIRTH CALL BACK NUMBER REASON FOR CALL**this is important as we prioritize the call backs  YOU WILL RECEIVE A CALL BACK THE SAME DAY AS LONG AS YOU CALL BEFORE 4:00 PM

## 2024-02-18 NOTE — Progress Notes (Signed)
 ReDS Vest / Clip - 02/18/24 1135       ReDS Vest / Clip   Station Marker A    Ruler Value 30    ReDS Value Range Moderate volume overload    ReDS Actual Value 39

## 2024-02-20 ENCOUNTER — Telehealth: Payer: Self-pay | Admitting: Family

## 2024-02-20 NOTE — Progress Notes (Unsigned)
 Advanced Heart Failure Clinic Note    PCP: Perri Constance Sor, PA-C  Primary Cardiologist: Ammon Blunt, MD/ Clarisa Kung, [place]   Chief Complaint:  shortness of breath   HPI:  Mr Mendenhall is a 73 y/o male with a history of leukemia, DM, mesenteric panniculitis, CAD (CABG 02/16/21), AF, sleep apnea, hyperlipidemia, HTN, stroke, GERD & chronic heart failure. Had CCM generator with A/V lead insertion on 09/04/22.  Admitted 06/09/22 due to LLQ abdominal pain. CT showed mesenteric pancolitis.   Was in the ED 04/05/23 due to weakness thought to be due to ozempic use. Ozempic stopped.   Was in the ED 04/16/23 due to hypotension. SBP in the 70's. Has had decreased fluid intake since ozempic usage which was stopped 2 weeks prior. Oral intake improving but still with nausea. Labs show no significant anemia, leukocytosis, electrolyte abnormality, or AKI. EKG shows no evidence of arrhythmia or ischemia.   Seen in [facility] 12/03/23. Volume overloaded. Given furoscix  X 2 days. Patient called the office the following day down 3 pounds and feeling much better.   Admitted 01/19/24 with worsening SOB/ pedal edema over the preceding week even after increasing home diuretics. IV diuresed. Cardiology consulted.   At [facility] visit 01/25/24, he was instructed to use 1 furoscix  and can use 2nd one if needed. Losartan  stopped and midodrine  started due to hypotension.   Admitted 02/09/2024 with A/C HFrEF. Diuresed with lasix  drip. Transitioned to torsemide  40 mg daily (previous home dose of torsemide ). Creatinine on the day discharge was 2.4. Discharged on 02/13/2024   At [facility] visit 02/18/24, torsemide  was increased to 60mg  daily due to fluid status being up and elevated ReDs reading. Midodrine  was also increased to 5mg  BID.   He presents today, with his wife for a HF follow-up visit with a chief complaint of moderate shortness of breath. Has associated fatigue, pedal edema, constipation, abdominal distention and not  sleeping well due to waking up coughing. + orthopnea. He says that he responded well to the increased torsemide  dose for the first day but doesn't feel like he's urinating anymore now than he normally does. Does admit that he's quite constipated because he has such a decreased appetite that he isn't eating as much. Drinking 3 bottles of water daily along with a cup of coffee.   Previous cardiac studies:  LHC 02/10/21:  2v CAD, including 100% midRCA, 70% midLAD with 80% focal distal LAD, 70% D1  Elevated LVEDP ( )   Cardiac MRI 01/27/21:  1. The left ventricle is mildly dilated.  Wall thickness is normal.     a.  The inferior wall is akinetic.     b.  The basal septum is severely hypokinetic.  The mid-distal septum is moderately hypokinetic.     c.  The basal anterior wall is severely hypokinetic. The mid-distal anterior wall and apex are  moderately hypokinetic.     d.  The lateral wall is moderately. hypokinetic.   Global LV systolic function is severely reduced with an LVEF calculated at 30%.    2. The right ventricle is normal in cavity size and wall thickness.  There is mild global hypokinesis with  mildly reduced RV systolic function.  The RVEF is calculated at 43%.   Echo 05/16/21: EF <20% along with mild/moderate LAE and mild/moderate MR.  Echo 09/16/21: EF 30-35% along with mild MR. Echo 06/29/22: EF 25% with moderate MR  ROS: All systems negative except as listed in HPI, PMH and Problem List.  SH:  Social History   Socioeconomic History   Marital status: Married    Spouse name: Not on file   Number of children: Not on file   Years of education: Not on file   Highest education level: Not on file  Occupational History   Not on file  Tobacco Use   Smoking status: Never   Smokeless tobacco: Never  Vaping Use   Vaping status: Never Used  Substance and Sexual Activity   Alcohol use: Not Currently   Drug use: No   Sexual activity: Not Currently  Other Topics Concern    Not on file  Social History Narrative   Not on file   Social Drivers of Health   Financial Resource Strain: Low Risk  (10/24/2023)   Received from [facility]   Overall Financial Resource Strain (CARDIA)    Difficulty of Paying Living Expenses: Not hard at all  Food Insecurity: No Food Insecurity (02/10/2024)   Hunger Vital Sign    Worried About Running Out of Food in the Last Year: Never true    Ran Out of Food in the Last Year: Never true  Transportation Needs: No Transportation Needs (02/10/2024)   PRAPARE - Administrator, Civil Service (Medical): No    Lack of Transportation (Non-Medical): No  Physical Activity: Not on file  Stress: Not on file  Social Connections: Moderately Integrated (02/10/2024)   Social Connection and Isolation Panel    Frequency of Communication with Friends and Family: More than three times a week    Frequency of Social Gatherings with Friends and Family: More than three times a week    Attends Religious Services: More than 4 times per year    Active Member of [employer] or Organizations: No    Attends Banker Meetings: Never    Marital Status: Married  Catering manager Violence: Not At Risk (02/10/2024)   Humiliation, Afraid, Rape, and Kick questionnaire    Fear of Current or Ex-Partner: No    Emotionally Abused: No    Physically Abused: No    Sexually Abused: No    FH:  Family History  Problem Relation Age of Onset   Parkinson's disease Mother    Lupus Mother    Heart disease Father     Past Medical History:  Diagnosis Date   Arrhythmia    atrial fibrillation   CHF (congestive heart failure) (HCC)    Coronary artery disease    Diabetes mellitus without complication (HCC)    GERD (gastroesophageal reflux disease)    Hypercholesteremia    Hypertension    Sleep apnea    Stroke (HCC)    2015 or longer ago    Current Outpatient Medications  Medication Sig Dispense Refill   albuterol  (VENTOLIN   HFA) 108 (90 Base) MCG/ACT inhaler Inhale 2 puffs into the lungs every 6 (six) hours as needed for wheezing or shortness of breath. 8.5 g 2   apixaban  (ELIQUIS ) 5 MG TABS tablet Take 1 tablet (5 mg total) by mouth 2 (two) times daily. 180 tablet 3   atorvastatin  (LIPITOR ) 80 MG tablet Take 1 tablet (80 mg total) by mouth daily. 30 tablet 5   calcitRIOL (ROCALTROL) 0.25 MCG capsule Take 0.25 mcg by mouth.     Continuous Glucose Sensor (FREESTYLE LIBRE 2 SENSOR) MISC by Does not apply route.     [Paused] Furosemide  (FUROSCIX ) 80 MG/10ML CTKT Take 80mg  daily as directed. (Patient not taking: Reported on 02/18/2024) 5 each 0  Insulin  Pen Needle (COMFORT EZ PEN NEEDLES) 32G X 8 MM MISC by Other route.     metFORMIN  (GLUCOPHAGE ) 500 MG tablet Take 500 mg by mouth 2 (two) times daily.     metolazone  (ZAROXOLYN ) 2.5 MG tablet Take 1 tablet (2.5 mg total) by mouth daily as needed (worsening edema, SOB). 30 tablet 0   metoprolol  succinate (TOPROL -XL) 25 MG 24 hr tablet TAKE 1/2 TABLET BY MOUTH EVERY EVENING 45 tablet 3   midodrine  (PROAMATINE ) 5 MG tablet Take 1 tablet (5 mg total) by mouth 2 (two) times daily with a meal. 60 tablet 3   omeprazole (PRILOSEC) 40 MG capsule Take 40 mg by mouth 2 (two) times daily.     ondansetron  (ZOFRAN -ODT) 4 MG disintegrating tablet Take 1 tablet (4 mg total) by mouth every 8 (eight) hours as needed for nausea or vomiting. 12 tablet 0   [Paused] potassium chloride  SA (KLOR-CON  M) 20 MEQ tablet Take 2 tablets (40 mEq total) by mouth as directed. By heart failure clinic (Patient not taking: Reported on 02/18/2024) 60 tablet 0   spironolactone  (ALDACTONE ) 25 MG tablet Take 0.5 tablets (12.5 mg total) by mouth daily.     tamsulosin  (FLOMAX ) 0.4 MG CAPS capsule Take 1 capsule (0.4 mg total) by mouth daily after supper. (Patient taking differently: Take 0.4 mg by mouth in the morning and at bedtime.)     torsemide  (DEMADEX ) 20 MG tablet Take 3 tablets (60 mg total) by mouth daily.  90 tablet 3   No current facility-administered medications for this visit.   Vitals:   02/21/24 0853  BP: 97/75  Pulse: 91  SpO2: 97%  Weight: 185 lb 2 oz (84 kg)   Wt Readings from Last 3 Encounters:  02/21/24 185 lb 2 oz (84 kg)  02/18/24 188 lb (85.3 kg)  02/13/24 180 lb 8.9 oz (81.9 kg)   Lab Results  Component Value Date   CREATININE 2.43 (H) 02/13/2024   CREATININE 2.05 (H) 02/12/2024   CREATININE 2.01 (H) 02/11/2024    PHYSICAL EXAM:  General: Well appearing.  Cor: Elevated JVD. Regular rhythm, rate.  Lungs: clear Abdomen: soft, nontender, distended. Extremities: 1+ pitting edema bilateral lower legs Neuro:. 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  Echo (5/23) with EF 30-35%, severe LV dilation, mild MR, low normal RV systolic function. He has a Medtronic ICD.   - NYHA III. Remains fluid volume up even though weight down a few pounds.  - Echo 05/16/21: EF <20% along with mild/moderate LAE and mild/moderate MR.  - Echo 06/29/22: EF 25% with moderate MR.   - Updated echo has been ordered - Discharged on 02/13/24 after decompensated HF admission and now re-accumulating fluid. On exam he is volume overloaded. Optivol reviewed and impedance trending down. Due to possible diuretic resistance, will stop torsemide  and begin bumex 2mg  daily. Add metolazone  2.5mg  weekly on Fridays.  - Have scheduled RHC at [facility] to further assess hemodynamics. Have reached out to his cardiologist at [facility] to see if they are agreeable to the RHC being done at [facility] & currently awaiting a response.  - ReDs reading: 39%;  3 days ago it was 39 % - GDMT limited by hypotension and CKD Stage IIIb - continue metoprolol  succinate 12.5 mg daily - continue spironolactone  12.5mg  daily. - allergic to empagliflozin with a yeast infection - cardiac contractility  modulator placed 05/24  - had AICD implanted 06/22/21; no shocks have been delivered - Of note he can not afford furoscix  copay.   2: Hypotension  - BP 97/75. Reports SBP at home upper 90's-120's - Continue midodrine  5 mg twice a day. Keep SBP >100 - BMET 02/19/24 reviewed: sodium 135, potassium 4.3, creatinine 2.41, GFR 27  3: DM with CKD Stage IIIb - A1c 01/19/24 was 7.1%  4: Atrial fibrillation- - continue metoprolol  succinate 12.5mg  daily - continue apixaban  5mg  BID - no bleeding issues.  - Hg 02/19/24 was 11.7   5: CAD- - CABG with a LIMA to LAD, SVG to ramus, and SVG to PDA on 02/16/21 - LHC 02/10/21: 2v CAD, including 100% midRCA, 70% midLAD with 80% focal distal LAD, 70% D1 elevated LVEDP ( )  - continue atorvastatin  80 mg daily.  - no ASA given stable CAD and apixaban  use - No chest pain.   6. CKD Stage IIIb -Creatinine baseline ~ 2.2 -He has follow up with Nephrology next week.   7: Constipation- - encouraged daily stool softener and PRN miralax    Return next week after RHC. He remains at high risk for readmission due to diuretic resistance.    I spent 35 minutes reviewing records, interviewing/ examing patient and managing plan/ orders.    Ellouise DELENA Class, FNP 02/20/24

## 2024-02-20 NOTE — Telephone Encounter (Signed)
 Called to confirm/remind patient of their appointment at the Advanced Heart Failure Clinic on 02/21/24.   Appointment:   [x] Confirmed  [] Left mess   [] No answer/No voice mail  [] VM Full/unable to leave message  [] Phone not in service  Patient reminded to bring all medications and/or complete list.  Confirmed patient has transportation. Gave directions, instructed to utilize valet parking.

## 2024-02-21 ENCOUNTER — Encounter: Payer: Self-pay | Admitting: Family

## 2024-02-21 ENCOUNTER — Ambulatory Visit: Payer: Self-pay | Admitting: Family

## 2024-02-21 ENCOUNTER — Other Ambulatory Visit
Admission: RE | Admit: 2024-02-21 | Discharge: 2024-02-21 | Disposition: A | Discharge status: Home / Self Care | Source: Ambulatory Visit | Attending: Family | Admitting: Family

## 2024-02-21 ENCOUNTER — Other Ambulatory Visit: Payer: Self-pay

## 2024-02-21 ENCOUNTER — Telehealth: Payer: Self-pay | Admitting: Family

## 2024-02-21 ENCOUNTER — Ambulatory Visit (HOSPITAL_BASED_OUTPATIENT_CLINIC_OR_DEPARTMENT_OTHER): Admitting: Family

## 2024-02-21 VITALS — BP 97/75 | HR 91 | Wt 185.1 lb

## 2024-02-21 DIAGNOSIS — I5022 Chronic systolic (congestive) heart failure: Secondary | ICD-10-CM | POA: Insufficient documentation

## 2024-02-21 DIAGNOSIS — E1122 Type 2 diabetes mellitus with diabetic chronic kidney disease: Secondary | ICD-10-CM | POA: Diagnosis not present

## 2024-02-21 DIAGNOSIS — I2510 Atherosclerotic heart disease of native coronary artery without angina pectoris: Secondary | ICD-10-CM

## 2024-02-21 DIAGNOSIS — I480 Paroxysmal atrial fibrillation: Secondary | ICD-10-CM | POA: Diagnosis not present

## 2024-02-21 DIAGNOSIS — I959 Hypotension, unspecified: Secondary | ICD-10-CM | POA: Diagnosis not present

## 2024-02-21 DIAGNOSIS — N1832 Chronic kidney disease, stage 3b: Secondary | ICD-10-CM

## 2024-02-21 DIAGNOSIS — K5900 Constipation, unspecified: Secondary | ICD-10-CM

## 2024-02-21 LAB — BASIC METABOLIC PANEL WITH GFR
Anion gap: 9 (ref 5–15)
BUN: 43 mg/dL — ABNORMAL HIGH (ref 8–23)
CO2: 24 mmol/L (ref 22–32)
Calcium: 8.6 mg/dL — ABNORMAL LOW (ref 8.9–10.3)
Chloride: 101 mmol/L (ref 98–111)
Creatinine, Ser: 2.4 mg/dL — ABNORMAL HIGH (ref 0.61–1.24)
GFR, Estimated: 27 mL/min — ABNORMAL LOW (ref >60)
Glucose, Bld: 158 mg/dL — ABNORMAL HIGH (ref 70–99)
Potassium: 3.9 mmol/L (ref 3.5–5.1)
Sodium: 134 mmol/L — ABNORMAL LOW (ref 135–145)

## 2024-02-21 LAB — CBC
HCT: 36.2 % — ABNORMAL LOW (ref 39.0–52.0)
Hemoglobin: 11.3 g/dL — ABNORMAL LOW (ref 13.0–17.0)
MCH: 25.4 pg — ABNORMAL LOW (ref 26.0–34.0)
MCHC: 31.2 g/dL (ref 30.0–36.0)
MCV: 81.3 fL (ref 80.0–100.0)
Platelets: 176 K/uL (ref 150–400)
RBC: 4.45 MIL/uL (ref 4.22–5.81)
RDW: 14.6 % (ref 11.5–15.5)
WBC: 5.1 K/uL (ref 4.0–10.5)
nRBC: 0 % (ref 0.0–0.2)

## 2024-02-21 MED ORDER — BUMETANIDE 2 MG PO TABS: 2.0000 mg | ORAL_TABLET | Freq: Every day | ORAL | 5 refills | Status: DC

## 2024-02-21 NOTE — Patient Instructions (Signed)
 Medication Changes:  Discontinue Torsemide   START taking Bumex 2 mg once daily  START taking Metolazone  every Friday  Lab Work:  Labs done today, your results will be available in MyChart, we will contact you for abnormal readings.   Testing/Procedures:   [facility] [address] [place] [address] Dept: [phone]  Jermaine Kramer  02/21/2024  You are scheduled for a Cardiac Catheterization on Monday, October 27 with Dr. Ezra Shuck.  1. Please arrive at the [facility] (Main Entrance A) at [facility]: [address] [place], [place] [address] at 8:30 AM (This time is 2 hour(s) before your procedure to ensure your preparation).   Free valet parking service is available. You will check in at ADMITTING. The support person will be asked to wait in the waiting room.  It is OK to have someone drop you off and come back when you are ready to be discharged.    Special note: Every effort is made to have your procedure done on time. Please understand that emergencies sometimes delay scheduled procedures.  2. Diet: Nothing to eat after midnight.   3. Hydration: On October 27, you may drink approved liquids (see below) until 2 hours before the procedure with 8 oz of water as your last intake.   List of approved liquids water, clear juice, clear tea, black coffee, fruit juices, non-citric and without pulp, carbonated beverages, Gatorade, Kool -Aid, plain Jello-O and plain ice popsicles.   5. Medication instructions in preparation for your procedure:   Contrast Allergy: No   Stop taking Eliquis  (Apixiban) on Monday, October 27.  Stop taking, Bumex (Bumetanide) Monday, October 27,  Do not take Diabetes Med Glucophage  (Metformin ) on the day of the procedure and HOLD 48 HOURS AFTER THE PROCEDURE.  Do not take insulin  on the morning of the procedure.  On the morning of your  procedure, take any morning medicines NOT listed above.  You may use sips of water.  6. Plan to go home the same day, you will only stay overnight if medically necessary. 7. Bring a current list of your medications and current insurance cards. 8. You MUST have a responsible person to drive you home. 9. Someone MUST be with you the first 24 hours after you arrive home or your discharge will be delayed. 10. Please wear clothes that are easy to get on and off and wear slip-on shoes.  Thank you for allowing us  to care for you!   -- [facility]      Follow-Up in: on Thursday, 02/28/2024 at 10 AM with Ellouise Class, NP.    If you have any questions or concerns before your next appointment please send us  a message through [facility] or call our office at [phone], If it is after office hours your call will be answered by our answering service and directed appropriately.     At the [facility], you and your health needs are our priority. We have a designated team specialized in the treatment of Heart Failure. This Care Team includes your primary Heart Failure Specialized Cardiologist (physician), Advanced Practice Providers (APPs- Physician Assistants and Nurse Practitioners), and Pharmacist who all work together to provide you with the care you need, when you need it.   You may see any of the following providers on your designated Care Team at your next follow up:  Dr. Toribio Fuel Dr. Ezra  McLean Dr. Ria Commander Dr. Odis Brownie Amy Lenetta, NP Caffie Shed, [place] [address] [place], [place] Beckey Coe, NP [place] Lee, NP Ellouise Class, NP Jaun Bash, PharmD

## 2024-02-21 NOTE — Progress Notes (Signed)
 ReDS Vest / Clip - 02/21/24 0853       ReDS Vest / Clip   Station Marker A    Ruler Value 30    ReDS Value Range Moderate volume overload    ReDS Actual Value 39

## 2024-02-21 NOTE — Progress Notes (Signed)
 Orders placed for right heart cath.   No precert required

## 2024-02-21 NOTE — Telephone Encounter (Signed)
 Heard back from [facility] cardiology and patient's cardiologist, Dr Ammon, prefers to do the RHC himself and this can be done at [facility] next week. Called the patient and spoke to both he and his wife regarding the date/ location change of the RHC. Told them they would be getting a phone call from Dr. Ammon office with the date/ time of RHC. They verbalized understanding and were appreciative of the information.   KATHEE Gosling, RN cancelled the RHC.

## 2024-02-25 ENCOUNTER — Encounter (HOSPITAL_COMMUNITY): Payer: Self-pay

## 2024-02-25 ENCOUNTER — Ambulatory Visit (HOSPITAL_COMMUNITY): Admit: 2024-02-25 | Admitting: Cardiology

## 2024-02-25 SURGERY — RIGHT HEART CATH
Anesthesia: LOCAL

## 2024-02-27 ENCOUNTER — Other Ambulatory Visit: Payer: Self-pay

## 2024-02-27 ENCOUNTER — Telehealth: Payer: Self-pay | Admitting: Family

## 2024-02-27 ENCOUNTER — Ambulatory Visit
Admission: RE | Admit: 2024-02-27 | Discharge: 2024-02-27 | Disposition: A | Discharge status: Home / Self Care | Attending: Cardiology | Admitting: Cardiology

## 2024-02-27 ENCOUNTER — Encounter: Payer: Self-pay | Admitting: Cardiology

## 2024-02-27 DIAGNOSIS — E1122 Type 2 diabetes mellitus with diabetic chronic kidney disease: Secondary | ICD-10-CM | POA: Insufficient documentation

## 2024-02-27 DIAGNOSIS — I272 Pulmonary hypertension, unspecified: Secondary | ICD-10-CM | POA: Insufficient documentation

## 2024-02-27 DIAGNOSIS — D631 Anemia in chronic kidney disease: Secondary | ICD-10-CM | POA: Insufficient documentation

## 2024-02-27 DIAGNOSIS — Z87891 Personal history of nicotine dependence: Secondary | ICD-10-CM | POA: Diagnosis not present

## 2024-02-27 DIAGNOSIS — I130 Hypertensive heart and chronic kidney disease with heart failure and stage 1 through stage 4 chronic kidney disease, or unspecified chronic kidney disease: Secondary | ICD-10-CM | POA: Insufficient documentation

## 2024-02-27 DIAGNOSIS — N2581 Secondary hyperparathyroidism of renal origin: Secondary | ICD-10-CM | POA: Diagnosis not present

## 2024-02-27 DIAGNOSIS — I5022 Chronic systolic (congestive) heart failure: Secondary | ICD-10-CM | POA: Diagnosis not present

## 2024-02-27 DIAGNOSIS — N1832 Chronic kidney disease, stage 3b: Secondary | ICD-10-CM | POA: Diagnosis not present

## 2024-02-27 DIAGNOSIS — Z951 Presence of aortocoronary bypass graft: Secondary | ICD-10-CM | POA: Diagnosis not present

## 2024-02-27 DIAGNOSIS — Z79899 Other long term (current) drug therapy: Secondary | ICD-10-CM | POA: Insufficient documentation

## 2024-02-27 HISTORY — PX: RIGHT HEART CATH: CATH118263

## 2024-02-27 LAB — POCT I-STAT EG7
Acid-Base Excess: 0 mmol/L (ref 0.0–2.0)
Acid-Base Excess: 0 mmol/L (ref 0.0–2.0)
Bicarbonate: 25.4 mmol/L (ref 20.0–28.0)
Bicarbonate: 24.9 mmol/L (ref 20.0–28.0)
Calcium, Ion: 1.13 mmol/L — ABNORMAL LOW (ref 1.15–1.40)
Calcium, Ion: 1.1 mmol/L — ABNORMAL LOW (ref 1.15–1.40)
HCT: 37 % — ABNORMAL LOW (ref 39.0–52.0)
HCT: 37 % — ABNORMAL LOW (ref 39.0–52.0)
Hemoglobin: 12.6 g/dL — ABNORMAL LOW (ref 13.0–17.0)
Hemoglobin: 12.6 g/dL — ABNORMAL LOW (ref 13.0–17.0)
O2 Saturation: 38 %
O2 Saturation: 39 %
Potassium: 3.9 mmol/L (ref 3.5–5.1)
Potassium: 3.9 mmol/L (ref 3.5–5.1)
Sodium: 128 mmol/L — ABNORMAL LOW (ref 135–145)
Sodium: 129 mmol/L — ABNORMAL LOW (ref 135–145)
TCO2: 27 mmol/L (ref 22–32)
TCO2: 26 mmol/L (ref 22–32)
pCO2, Ven: 43.1 mmHg — ABNORMAL LOW (ref 44–60)
pCO2, Ven: 42.6 mmHg — ABNORMAL LOW (ref 44–60)
pH, Ven: 7.379 (ref 7.25–7.43)
pH, Ven: 7.374 (ref 7.25–7.43)
pO2, Ven: 23 mmHg — CL (ref 32–45)
pO2, Ven: 23 mmHg — CL (ref 32–45)

## 2024-02-27 LAB — GLUCOSE, CAPILLARY
Glucose-Capillary: 167 mg/dL — ABNORMAL HIGH (ref 70–99)
Glucose-Capillary: 144 mg/dL — ABNORMAL HIGH (ref 70–99)

## 2024-02-27 SURGERY — RIGHT HEART CATH
Anesthesia: Moderate Sedation | Laterality: Right

## 2024-02-27 MED ORDER — LIDOCAINE HCL (PF) 1 % IJ SOLN
INTRAMUSCULAR | Status: DC | PRN
  Administered 2024-02-27: 15 mL
  Administered 2024-02-27: 5 mL

## 2024-02-27 MED ORDER — FREE WATER: 250.0000 mL | Freq: Once | Status: DC

## 2024-02-27 MED ORDER — HEPARIN (PORCINE) IN NACL 1000-0.9 UT/500ML-% IV SOLN
INTRAVENOUS | Status: DC | PRN
  Administered 2024-02-27 (×2): 500 mL

## 2024-02-27 MED ORDER — LIDOCAINE HCL 1 % IJ SOLN
INTRAMUSCULAR | Status: AC
  Filled 2024-02-27: qty 20

## 2024-02-27 MED ORDER — SODIUM CHLORIDE 0.9 % IV SOLN: 250.0000 mL | INTRAVENOUS | Status: DC | PRN

## 2024-02-27 MED ORDER — SODIUM CHLORIDE 0.9 % IV SOLN
250.0000 mL | INTRAVENOUS | Status: DC | PRN
  Administered 2024-02-27: 250 mL via INTRAVENOUS

## 2024-02-27 MED ORDER — SODIUM CHLORIDE 0.9% FLUSH: 3.0000 mL | INTRAVENOUS | Status: DC | PRN

## 2024-02-27 MED ORDER — SODIUM CHLORIDE 0.9% FLUSH: 3.0000 mL | Freq: Two times a day (BID) | INTRAVENOUS | Status: DC

## 2024-02-27 MED ORDER — IOHEXOL 300 MG/ML  SOLN
INTRAMUSCULAR | Status: DC | PRN
  Administered 2024-02-27: 10 mL

## 2024-02-27 MED ORDER — HYDRALAZINE HCL 20 MG/ML IJ SOLN: 10.0000 mg | INTRAMUSCULAR | Status: DC | PRN

## 2024-02-27 MED ORDER — LABETALOL HCL 5 MG/ML IV SOLN: 10.0000 mg | INTRAVENOUS | Status: DC | PRN

## 2024-02-27 MED ORDER — ONDANSETRON HCL 4 MG/2ML IJ SOLN: 4.0000 mg | Freq: Four times a day (QID) | INTRAMUSCULAR | Status: DC | PRN

## 2024-02-27 MED ORDER — ACETAMINOPHEN 325 MG PO TABS: 650.0000 mg | ORAL_TABLET | ORAL | Status: DC | PRN

## 2024-02-27 MED ORDER — HEPARIN (PORCINE) IN NACL 1000-0.9 UT/500ML-% IV SOLN
INTRAVENOUS | Status: AC
  Filled 2024-02-27: qty 1000

## 2024-02-27 MED ORDER — FENTANYL CITRATE (PF) 100 MCG/2ML IJ SOLN
INTRAMUSCULAR | Status: AC
  Filled 2024-02-27: qty 2

## 2024-02-27 MED ORDER — MIDAZOLAM HCL 2 MG/2ML IJ SOLN
INTRAMUSCULAR | Status: AC
  Filled 2024-02-27: qty 2

## 2024-02-27 SURGICAL SUPPLY — 13 items
CATH BALLN WEDGE 5F 110CM (CATHETERS) IMPLANT
CATH SWAN GANZ 7F STRAIGHT (CATHETERS) IMPLANT
DRAPE BRACHIAL (DRAPES) IMPLANT
GUIDEWIRE .025 260CM (WIRE) IMPLANT
GUIDEWIRE EMER 3M J .025X150CM (WIRE) IMPLANT
KIT SYRINGE INJ CVI SPIKEX1 (MISCELLANEOUS) IMPLANT
NDL PERC 18GX7CM (NEEDLE) IMPLANT
NEEDLE PERC 18GX7CM (NEEDLE) ×1 IMPLANT
PACK CARDIAC CATH (CUSTOM PROCEDURE TRAY) ×2 IMPLANT
SET ATX-X65L (MISCELLANEOUS) IMPLANT
SHEATH AVANTI 7FRX11 (SHEATH) IMPLANT
SHEATH GLIDE SLENDER 4/5FR (SHEATH) IMPLANT
STATION PROTECTION PRESSURIZED (MISCELLANEOUS) IMPLANT

## 2024-02-27 NOTE — Discharge Instructions (Addendum)
 May resume Eliquis  10/30/2025Right Heart Cath, Care After This sheet gives you information about how to care for yourself after your procedure. Your health care provider may also give you more specific instructions. If you have problems or questions, contact your health care provider. What can I expect after the procedure? After the procedure, it is common to have: Bruising or mild discomfort in the area where the IV was inserted (insertion site). Follow these instructions at home: Eating and drinking  You may eat and drink after your procedure.  Drink a lot of fluids for the first several days after the procedure, as directed by your health care provider. This helps to wash (flush) the contrast out of your body. Examples of healthy fluids include water or low-calorie drinks. General instructions Check your IV insertion area and also your venous access site every day for signs of infection. Check for: Redness, swelling, or pain. Fluid or blood. Warmth. Pus or a bad smell. Take over-the-counter and prescription medicines only as told by your health care provider. Rest and return to your normal activities as told by your health care provider. Ask your health care provider what activities are safe for you. Do not drive for 24 hours if you were given a medicine to help you relax (sedative), or until your health care provider approves. Keep all follow-up visits as told by your health care provider. This is important. Contact a health care provider if: Your skin becomes itchy or you develop a rash or hives. You have a fever that does not get better with medicine. You feel nauseous. You vomit. You have redness, swelling, or pain around the insertion site. You have fluid or blood coming from the insertion site. Your insertion area feels warm to the touch. You have pus or a bad smell coming from the insertion site. Get help right away if: You have difficulty breathing or shortness of breath. You  develop chest pain. You faint. You feel very dizzy. These symptoms may represent a serious problem that is an emergency. Do not wait to see if the symptoms will go away. Get medical help right away. Call your local emergency services (911 in the [place] Do not drive yourself to the hospital. Summary After your procedure, it is common to have bruising or mild discomfort in the area where the IV was inserted. You should check your IV insertion area every day for signs of infection. Take over-the-counter and prescription medicines only as told by your health care provider. You should drink a lot of fluids for the first several days after the procedure to help flush the contrast from your body. This information is not intended to replace advice given to you by your health care provider. Make sure you discuss any questions you have with your health care provider. Document Released: 02/05/2013 Document Revised: 03/30/2017 Document Reviewed: 03/11/2016 Elsevier Patient Education  2020 [employer].

## 2024-02-27 NOTE — Telephone Encounter (Signed)
 Called to confirm/remind patient of their appointment at the [facility] on 02/28/24.   Appointment:   [x] Confirmed  [] Left mess   [] No answer/No voice mail  [] VM Full/unable to leave message  [] Phone not in service  Patient reminded to bring all medications and/or complete list.  Confirmed patient has transportation. Gave directions, instructed to utilize valet parking.

## 2024-02-28 ENCOUNTER — Encounter: Admission: RE | Disposition: A | Payer: Self-pay | Attending: Cardiology

## 2024-02-28 ENCOUNTER — Ambulatory Visit: Attending: Family | Admitting: Family

## 2024-02-28 ENCOUNTER — Encounter: Payer: Self-pay | Admitting: Cardiology

## 2024-02-28 VITALS — BP 85/61 | HR 77 | Wt 186.4 lb

## 2024-02-28 DIAGNOSIS — Z79899 Other long term (current) drug therapy: Secondary | ICD-10-CM | POA: Insufficient documentation

## 2024-02-28 DIAGNOSIS — Z7901 Long term (current) use of anticoagulants: Secondary | ICD-10-CM | POA: Insufficient documentation

## 2024-02-28 DIAGNOSIS — N1832 Chronic kidney disease, stage 3b: Secondary | ICD-10-CM | POA: Diagnosis not present

## 2024-02-28 DIAGNOSIS — Z95810 Presence of automatic (implantable) cardiac defibrillator: Secondary | ICD-10-CM | POA: Insufficient documentation

## 2024-02-28 DIAGNOSIS — Z7984 Long term (current) use of oral hypoglycemic drugs: Secondary | ICD-10-CM | POA: Diagnosis not present

## 2024-02-28 DIAGNOSIS — I255 Ischemic cardiomyopathy: Secondary | ICD-10-CM | POA: Diagnosis not present

## 2024-02-28 DIAGNOSIS — E1122 Type 2 diabetes mellitus with diabetic chronic kidney disease: Secondary | ICD-10-CM | POA: Insufficient documentation

## 2024-02-28 DIAGNOSIS — Z951 Presence of aortocoronary bypass graft: Secondary | ICD-10-CM | POA: Diagnosis not present

## 2024-02-28 DIAGNOSIS — I272 Pulmonary hypertension, unspecified: Secondary | ICD-10-CM | POA: Diagnosis not present

## 2024-02-28 DIAGNOSIS — I5022 Chronic systolic (congestive) heart failure: Secondary | ICD-10-CM | POA: Diagnosis present

## 2024-02-28 DIAGNOSIS — Z794 Long term (current) use of insulin: Secondary | ICD-10-CM | POA: Insufficient documentation

## 2024-02-28 DIAGNOSIS — I4891 Unspecified atrial fibrillation: Secondary | ICD-10-CM | POA: Diagnosis not present

## 2024-02-28 DIAGNOSIS — I2510 Atherosclerotic heart disease of native coronary artery without angina pectoris: Secondary | ICD-10-CM | POA: Insufficient documentation

## 2024-02-28 DIAGNOSIS — I959 Hypotension, unspecified: Secondary | ICD-10-CM | POA: Diagnosis not present

## 2024-02-28 DIAGNOSIS — I480 Paroxysmal atrial fibrillation: Secondary | ICD-10-CM

## 2024-02-28 DIAGNOSIS — Z8673 Personal history of transient ischemic attack (TIA), and cerebral infarction without residual deficits: Secondary | ICD-10-CM | POA: Insufficient documentation

## 2024-02-28 DIAGNOSIS — I130 Hypertensive heart and chronic kidney disease with heart failure and stage 1 through stage 4 chronic kidney disease, or unspecified chronic kidney disease: Secondary | ICD-10-CM | POA: Diagnosis not present

## 2024-02-28 NOTE — Patient Instructions (Signed)
 It was good to see you today!

## 2024-02-28 NOTE — Progress Notes (Addendum)
 [facility] Note    PCP: Perri Constance Sor, PA-C  Primary Cardiologist: Ammon Blunt, MD/ Clarisa Kung, [place]   Chief Complaint:  shortness of breath   HPI:  Mr Jermaine Kramer is a 73 y/o male with a history of leukemia, DM, mesenteric panniculitis, CAD (CABG 02/16/21), AF, sleep apnea, hyperlipidemia, HTN, stroke, GERD & chronic heart failure. Had CCM generator with A/V lead insertion on 09/04/22.  Admitted 06/09/22 due to LLQ abdominal pain. CT showed mesenteric pancolitis.   Was in the ED 04/05/23 due to weakness thought to be due to ozempic use. Ozempic stopped.   Was in the ED 04/16/23 due to hypotension. SBP in the 70's. Has had decreased fluid intake since ozempic usage which was stopped 2 weeks prior. Oral intake improving but still with nausea. Labs show no significant anemia, leukocytosis, electrolyte abnormality, or AKI. EKG shows no evidence of arrhythmia or ischemia.   Seen in [facility] 12/03/23. Volume overloaded. Given furoscix  X 2 days. Patient called the office the following day down 3 pounds and feeling much better.   Admitted 01/19/24 with worsening SOB/ pedal edema over the preceding week even after increasing home diuretics. IV diuresed. Cardiology consulted.   At [facility] visit 01/25/24, he was instructed to use 1 furoscix  and can use 2nd one if needed. Losartan  stopped and midodrine  started due to hypotension.   Admitted 02/09/2024 with A/C HFrEF. Diuresed with lasix  drip. Transitioned to torsemide  40 mg daily (previous home dose of torsemide ). Creatinine on the day discharge was 2.4. Discharged on 02/13/2024   At [facility] visit 02/18/24, torsemide  was increased to 60mg  daily due to fluid status being up and elevated ReDs reading. Midodrine  was also increased to 5mg  BID.   RHC 02/27/24:   RA   27/26,  22 mmHg           RV   49/6,    21 mmHg           PA   54/29,  37 mmHg           PW  29/35,  27 mmHg            CO 2.65           CI  1.39  2.  Moderate pulmonary  hypertension  He presents today, with his wife for a HF follow-up visit with a chief complaint of moderate shortness of breath with little exertion. Has fatigue, abdominal distention, pedal edema. Continues to not sleep well. He has bruising and tenderness at right [facility] from catheter insertion. Wife says that she feels like he's responding better to the bumex that was started at last visit. He's due to take his weekly metolazone  tomorrow.   Home SBP running low-mid 100's.   Had RHC done yesterday and wife says she was told that the results were what was expected. Has cardiology appointment next week to discuss the results further.   Previous cardiac studies:  LHC 02/10/21:  2v CAD, including 100% midRCA, 70% midLAD with 80% focal distal LAD, 70% D1  Elevated LVEDP ( )   Cardiac MRI 01/27/21:  1. The left ventricle is mildly dilated.  Wall thickness is normal.     a.  The inferior wall is akinetic.     b.  The basal septum is severely hypokinetic.  The mid-distal septum is moderately hypokinetic.     c.  The basal anterior wall is severely hypokinetic. The mid-distal anterior wall and apex are  moderately hypokinetic.  d.  The lateral wall is moderately. hypokinetic.   Global LV systolic function is severely reduced with an LVEF calculated at 30%.    2. The right ventricle is normal in cavity size and wall thickness.  There is mild global hypokinesis with  mildly reduced RV systolic function.  The RVEF is calculated at 43%.   Echo 05/16/21: EF <20% along with mild/moderate LAE and mild/moderate MR.  Echo 09/16/21: EF 30-35% along with mild MR. Echo 06/29/22: EF 25% with moderate MR  ROS: All systems negative except as listed in HPI, PMH and Problem List.  SH:  Social History   Socioeconomic History   Marital status: Married    Spouse name: Not on file   Number of children: Not on file   Years of education: Not on file   Highest education level: Not on file  Occupational  History   Not on file  Tobacco Use   Smoking status: Never   Smokeless tobacco: Never  Vaping Use   Vaping status: Never Used  Substance and Sexual Activity   Alcohol use: Not Currently   Drug use: No   Sexual activity: Not Currently  Other Topics Concern   Not on file  Social History Narrative   Not on file   Social Drivers of Health   Financial Resource Strain: Low Risk  (10/24/2023)   Received from [facility]   Overall Financial Resource Strain (CARDIA)    Difficulty of Paying Living Expenses: Not hard at all  Food Insecurity: No Food Insecurity (02/10/2024)   Hunger Vital Sign    Worried About Running Out of Food in the Last Year: Never true    Ran Out of Food in the Last Year: Never true  Transportation Needs: No Transportation Needs (02/10/2024)   PRAPARE - Administrator, Civil Service (Medical): No    Lack of Transportation (Non-Medical): No  Physical Activity: Not on file  Stress: Not on file  Social Connections: Moderately Integrated (02/10/2024)   Social Connection and Isolation Panel    Frequency of Communication with Friends and Family: More than three times a week    Frequency of Social Gatherings with Friends and Family: More than three times a week    Attends Religious Services: More than 4 times per year    Active Member of [employer] or Organizations: No    Attends Banker Meetings: Never    Marital Status: Married  Catering Manager Violence: Not At Risk (02/10/2024)   Humiliation, Afraid, Rape, and Kick questionnaire    Fear of Current or Ex-Partner: No    Emotionally Abused: No    Physically Abused: No    Sexually Abused: No    FH:  Family History  Problem Relation Age of Onset   Parkinson's disease Mother    Lupus Mother    Heart disease Father     Past Medical History:  Diagnosis Date   Arrhythmia    atrial fibrillation   CHF (congestive heart failure) (HCC)    Coronary artery disease    Diabetes  mellitus without complication (HCC)    GERD (gastroesophageal reflux disease)    Hypercholesteremia    Hypertension    Sleep apnea    Stroke (HCC)    2015 or longer ago    Current Outpatient Medications  Medication Sig Dispense Refill   albuterol  (VENTOLIN  HFA) 108 (90 Base) MCG/ACT inhaler Inhale 2 puffs into the lungs every 6 (six) hours as needed for  wheezing or shortness of breath. 8.5 g 2   apixaban  (ELIQUIS ) 5 MG TABS tablet Take 1 tablet (5 mg total) by mouth 2 (two) times daily. 180 tablet 3   atorvastatin  (LIPITOR ) 80 MG tablet Take 1 tablet (80 mg total) by mouth daily. 30 tablet 5   bumetanide (BUMEX) 2 MG tablet Take 1 tablet (2 mg total) by mouth daily. 30 tablet 5   calcitRIOL (ROCALTROL) 0.25 MCG capsule Take 0.25 mcg by mouth daily.     Continuous Glucose Sensor (FREESTYLE LIBRE 2 SENSOR) MISC by Does not apply route.     Insulin  Pen Needle (COMFORT EZ PEN NEEDLES) 32G X 8 MM MISC by Other route.     metFORMIN  (GLUCOPHAGE ) 500 MG tablet Take 500 mg by mouth 2 (two) times daily.     metolazone  (ZAROXOLYN ) 2.5 MG tablet Take 1 tablet (2.5 mg total) by mouth daily as needed (worsening edema, SOB). (Patient taking differently: Take 2.5 mg by mouth every Friday.) 30 tablet 0   metoprolol  succinate (TOPROL -XL) 25 MG 24 hr tablet TAKE 1/2 TABLET BY MOUTH EVERY EVENING 45 tablet 3   midodrine  (PROAMATINE ) 5 MG tablet Take 1 tablet (5 mg total) by mouth 2 (two) times daily with a meal. 60 tablet 3   omeprazole (PRILOSEC) 40 MG capsule Take 40 mg by mouth 2 (two) times daily.     ondansetron  (ZOFRAN -ODT) 4 MG disintegrating tablet Take 1 tablet (4 mg total) by mouth every 8 (eight) hours as needed for nausea or vomiting. 12 tablet 0   spironolactone  (ALDACTONE ) 25 MG tablet Take 0.5 tablets (12.5 mg total) by mouth daily.     tamsulosin  (FLOMAX ) 0.4 MG CAPS capsule Take 1 capsule (0.4 mg total) by mouth daily after supper. (Patient taking differently: Take 0.4 mg by mouth in the  morning and at bedtime.)     No current facility-administered medications for this visit.   Vitals:   02/28/24 0945  BP: (!) 85/61  Pulse: 77  SpO2: 98%  Weight: 186 lb 6 oz (84.5 kg)   Wt Readings from Last 3 Encounters:  02/28/24 186 lb 6 oz (84.5 kg)  02/27/24 185 lb 9.6 oz (84.2 kg)  02/21/24 185 lb 2 oz (84 kg)   Lab Results  Component Value Date   CREATININE 2.40 (H) 02/21/2024   CREATININE 2.43 (H) 02/13/2024   CREATININE 2.05 (H) 02/12/2024    PHYSICAL EXAM:  General: Tired appearing, elderly male.  Cor: Elevated JVD. Regular rhythm, rate.  Lungs: clear Abdomen: soft, nontender, distended. Extremities: 1+ pitting edema bilateral lower legs. Bruising noted over right AC, no knot felt.  Neuro:. Affect pleasant, + radial pulse in right wrist. Hand warm.  Medtronic device interrogated and quick look was reviewed. 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  Echo (5/23) with EF 30-35%, severe LV dilation, mild MR, low normal RV systolic function. He has a Medtronic ICD.   - NYHA III. Remains fluid volume up although stable - Echo 05/16/21: EF <20% along with mild/moderate LAE and mild/moderate MR.  - Echo 06/29/22: EF 25% with moderate MR.   - Echo currently scheduled for 04/17/24 - GDMT limited by hypotension and CKD Stage IIIb - continue bumex 2mg  daily with weekly metolazone  on Fridays - continue metoprolol  succinate 12.5 mg daily - continue spironolactone  12.5mg  daily. BP will not tolerate titration - allergic to empagliflozin with a  yeast infection - cardiac contractility modulator placed 05/24  - had AICD implanted 06/22/21; no shocks have been delivered - Of note, he can not afford furoscix  copay.   2: Hypotension  - BP 85/61. Home SBP low-mid 100's - Continue midodrine  5 mg twice a day. Keep SBP >100 - BMET 02/21/24 reviewed: sodium 134,  potassium 3.9, creatinine 2.4, GFR 27 - sodium 02/27/24 pre-cath was 128  3: DM with CKD Stage IIIb - A1c 01/19/24 was 7.1%  4: Atrial fibrillation- - continue metoprolol  succinate 12.5mg  daily - continue apixaban  5mg  BID - no bleeding issues.  - Hg 02/27/24 was 12.6   5: CAD- - CABG with a LIMA to LAD, SVG to ramus, and SVG to PDA on 02/16/21 - LHC 02/10/21: 2v CAD, including 100% midRCA, 70% midLAD with 80% focal distal LAD, 70% D1 elevated LVEDP ( )  - continue atorvastatin  80 mg daily.  - no ASA given stable CAD and apixaban  use - No chest pain.   6. CKD Stage IIIb - Creatinine baseline ~ 2.2 - Saw nephrology Geoffry) 10/25  7: pHTN- - RHC 02/27/24:           RA   27/26,  22 mmHg           RV   49/6,    21 mmHg           PA   54/29,  37 mmHg           PW  29/35,  27 mmHg            CO 2.65           CI  1.39  - reviewed what pHTN is & that treatment may be dependent on risk/ benefit of treatment with his CKD. They have upcoming appointment with cardiology next week to discuss results further.  - right AC is bruised and sore. Can take tight bandage off today and place bandaid. Elevate arm on a pillow and apply warm compress. If worsens or if hand/ fingers get cool, contact [facility] cardiology.   He remains at high risk for readmission due to diuretic resistance.     Return at already scheduled appointment 01/26, sooner if needed.   I spent 48 minutes reviewing records, interviewing/ examing patient and managing plan/ orders.   Ellouise DELENA Class, FNP 02/28/24

## 2024-03-02 IMAGING — DX DG ABD PORTABLE 1V
2 series · 2 of 2 positions shown · non-contrast
Comparison: Chest x-ray dated July 26, 2021

CLINICAL DATA: Left flank pain

EXAM:
PORTABLE ABDOMEN - 1 VIEW

[abdomen supine (1 of 2)]
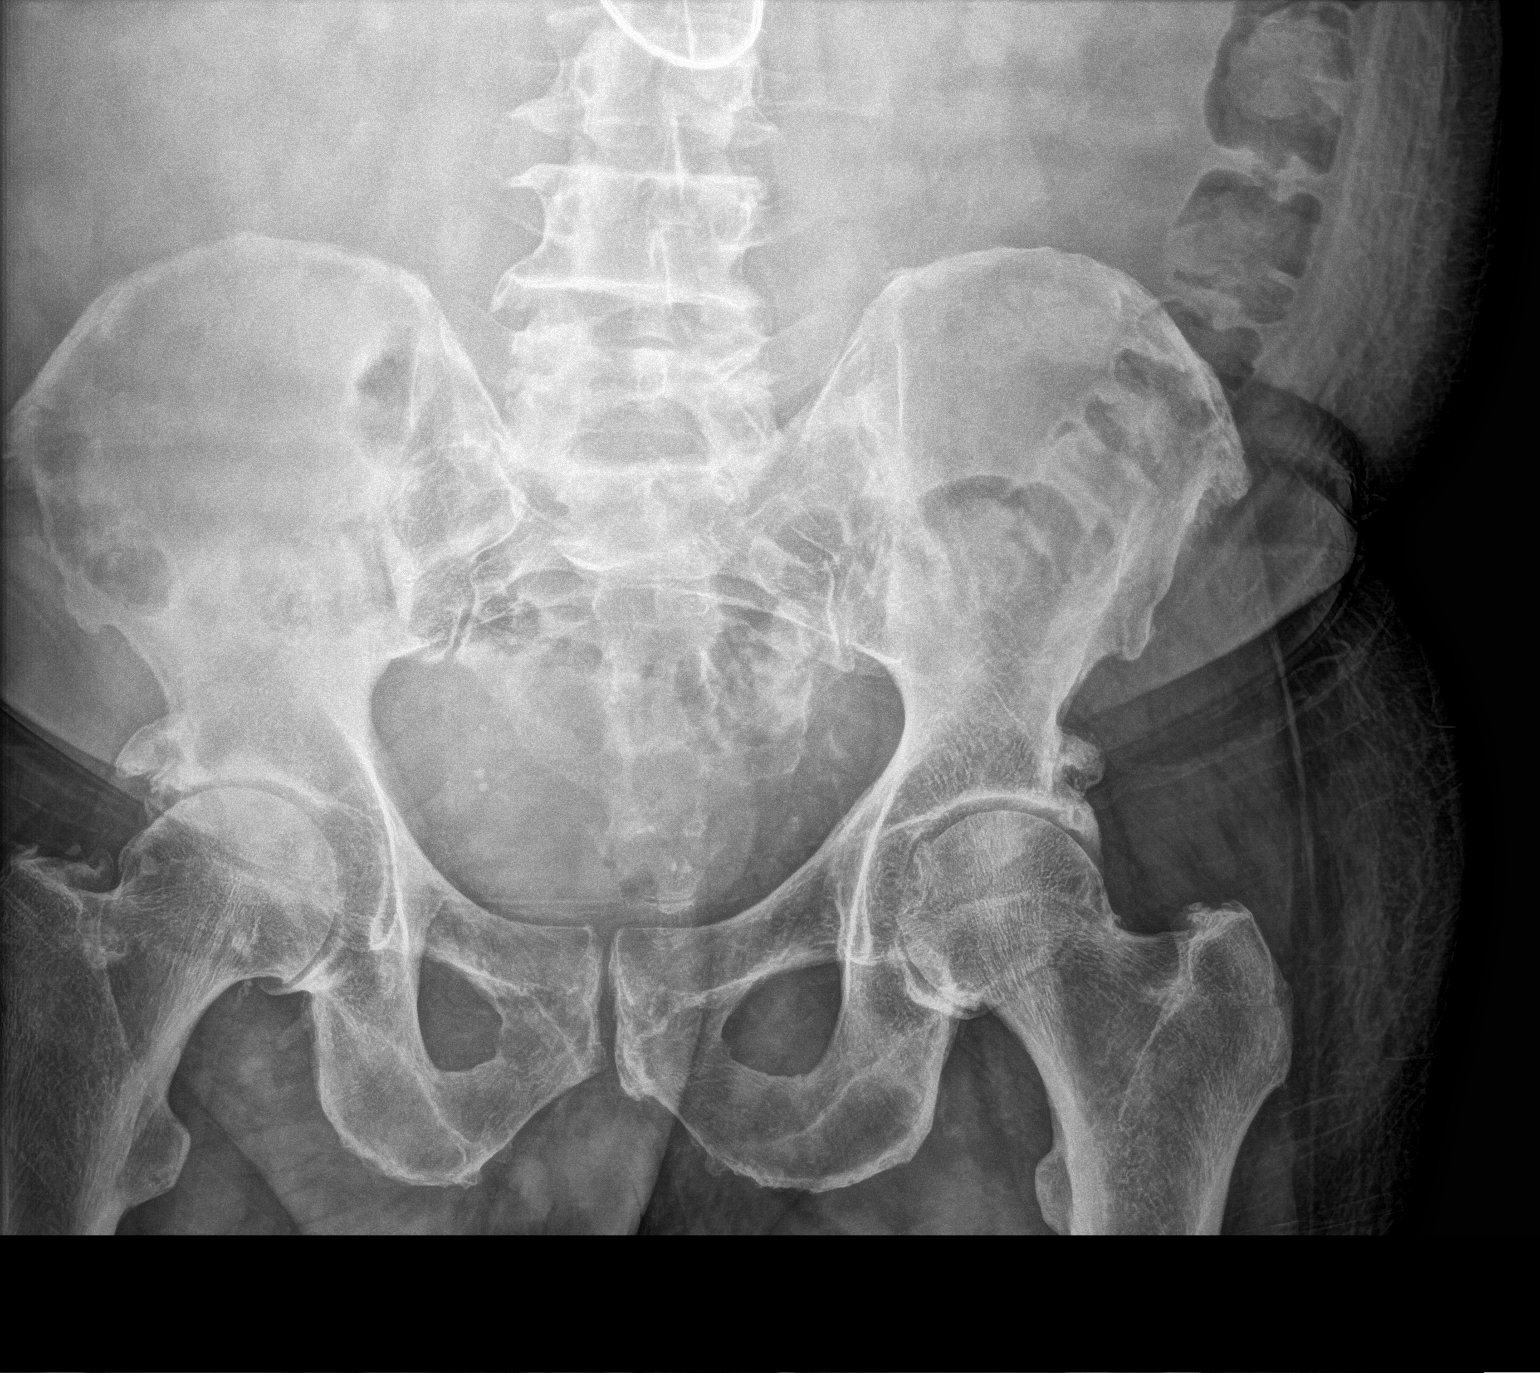

[abdomen supine (2 of 2)]
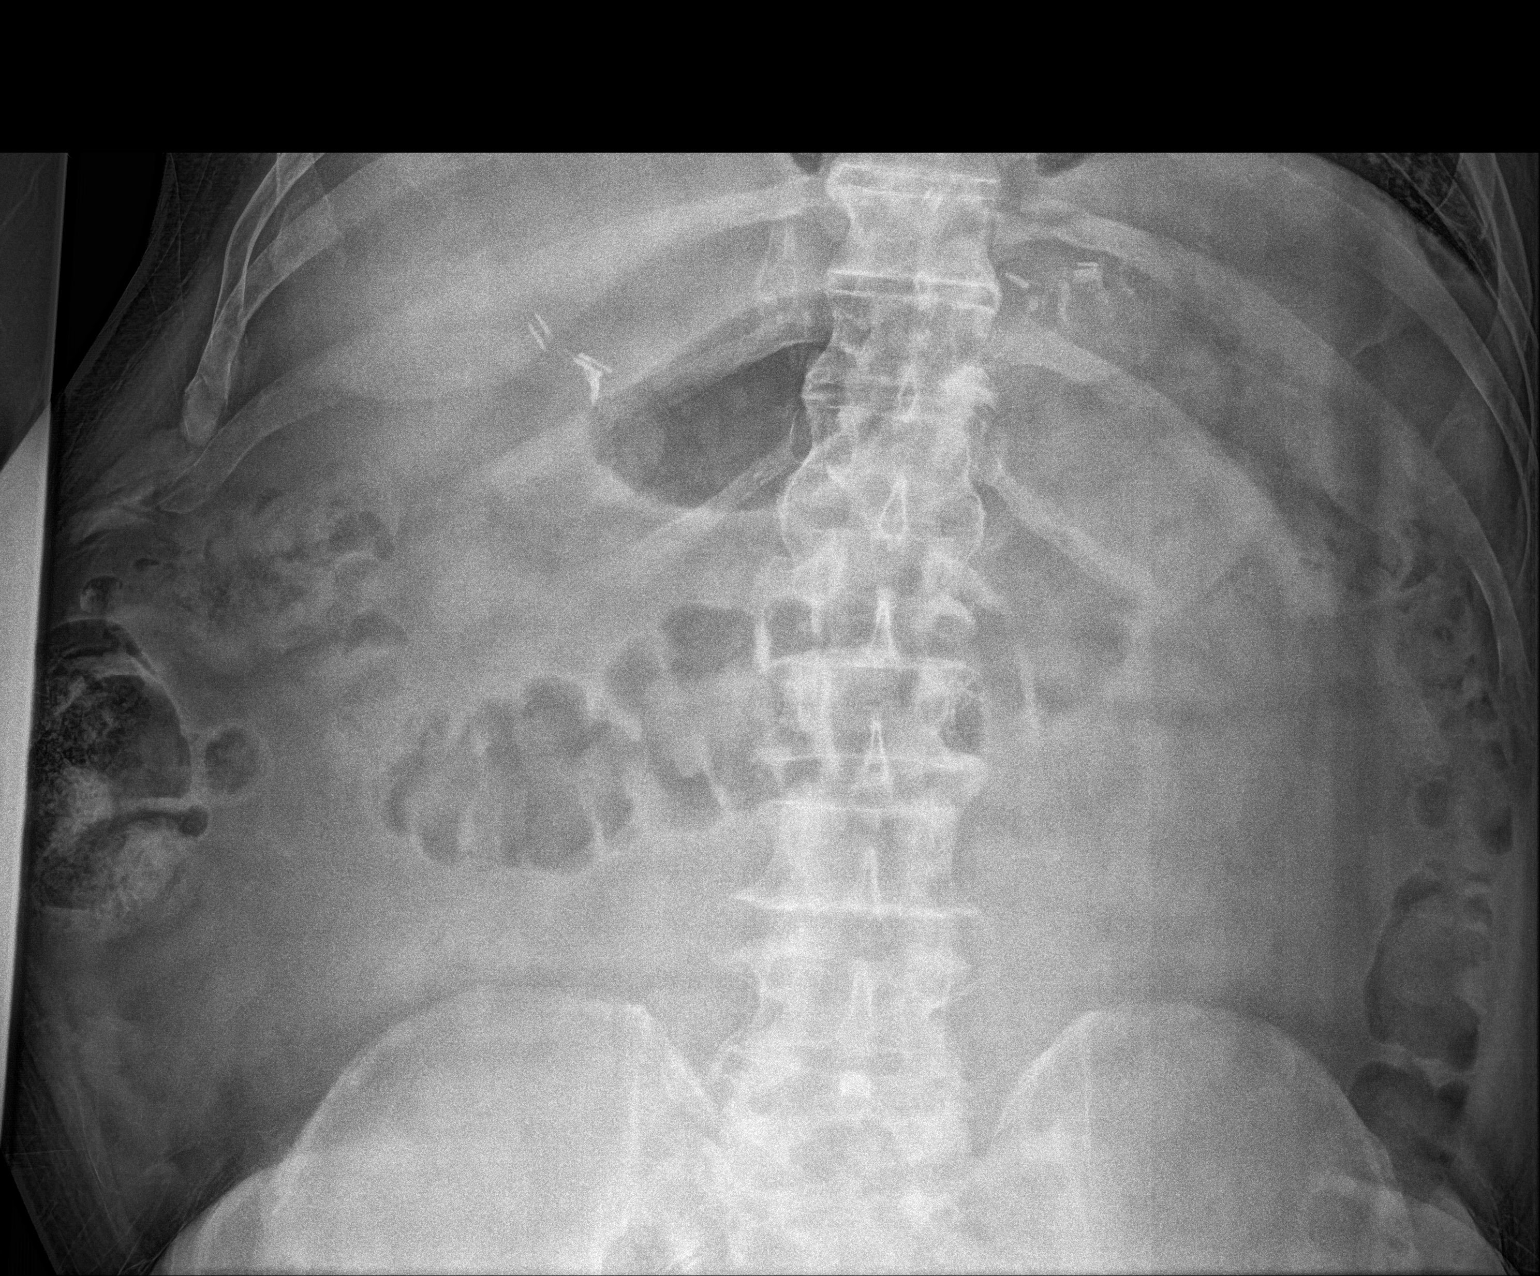

[2 of 2 positions shown; findings below may reference images not displayed]

FINDINGS: Nondilated air-filled loops of colon are seen. No gas-filled dilated
loops of small bowel. Visualized lung bases demonstrate bibasilar
atelectasis. No acute osseous abnormality.
IMPRESSION: Nonobstructive bowel gas pattern. No gas-filled dilated loops of
small bowel are seen on today's exam.

## 2024-03-06 ENCOUNTER — Telehealth: Payer: Self-pay

## 2024-03-17 ENCOUNTER — Encounter
Admission: RE | Admit: 2024-03-17 | Discharge: 2024-03-17 | Disposition: A | Discharge status: Home / Self Care | Source: Ambulatory Visit | Attending: Cardiology | Admitting: Cardiology

## 2024-03-17 DIAGNOSIS — I509 Heart failure, unspecified: Secondary | ICD-10-CM | POA: Insufficient documentation

## 2024-03-17 DIAGNOSIS — R140 Abdominal distension (gaseous): Secondary | ICD-10-CM | POA: Insufficient documentation

## 2024-03-17 MED ORDER — FUROSEMIDE 10 MG/ML IJ SOLN
120.0000 mg | Freq: Once | INTRAVENOUS | Status: AC
  Administered 2024-03-17: 120 mg via INTRAVENOUS
  Filled 2024-03-17: qty 12

## 2024-03-17 MED ORDER — POTASSIUM CHLORIDE CRYS ER 20 MEQ PO TBCR
40.0000 meq | EXTENDED_RELEASE_TABLET | Freq: Once | ORAL | Status: AC
  Administered 2024-03-17: 40 meq via ORAL

## 2024-03-17 MED ORDER — POTASSIUM CHLORIDE CRYS ER 20 MEQ PO TBCR
EXTENDED_RELEASE_TABLET | ORAL | Status: AC
  Filled 2024-03-17: qty 2

## 2024-03-19 ENCOUNTER — Inpatient Hospital Stay
Admission: EM | Admit: 2024-03-19 | Discharge: 2024-03-24 | DRG: 291 | Disposition: A | Discharge status: Home / Self Care | Source: Ambulatory Visit | Attending: Obstetrics and Gynecology | Admitting: Obstetrics and Gynecology

## 2024-03-19 ENCOUNTER — Emergency Department

## 2024-03-19 ENCOUNTER — Ambulatory Visit: Payer: Medicare Other

## 2024-03-19 ENCOUNTER — Other Ambulatory Visit: Payer: Self-pay

## 2024-03-19 DIAGNOSIS — I2510 Atherosclerotic heart disease of native coronary artery without angina pectoris: Secondary | ICD-10-CM | POA: Diagnosis present

## 2024-03-19 DIAGNOSIS — E66811 Obesity, class 1: Secondary | ICD-10-CM | POA: Diagnosis present

## 2024-03-19 DIAGNOSIS — I482 Chronic atrial fibrillation, unspecified: Secondary | ICD-10-CM | POA: Diagnosis present

## 2024-03-19 DIAGNOSIS — E871 Hypo-osmolality and hyponatremia: Secondary | ICD-10-CM | POA: Diagnosis present

## 2024-03-19 DIAGNOSIS — Z8549 Personal history of malignant neoplasm of other male genital organs: Secondary | ICD-10-CM

## 2024-03-19 DIAGNOSIS — N400 Enlarged prostate without lower urinary tract symptoms: Secondary | ICD-10-CM | POA: Diagnosis present

## 2024-03-19 DIAGNOSIS — Z9841 Cataract extraction status, right eye: Secondary | ICD-10-CM

## 2024-03-19 DIAGNOSIS — R188 Other ascites: Secondary | ICD-10-CM | POA: Diagnosis present

## 2024-03-19 DIAGNOSIS — R601 Generalized edema: Principal | ICD-10-CM

## 2024-03-19 DIAGNOSIS — N184 Chronic kidney disease, stage 4 (severe): Secondary | ICD-10-CM | POA: Diagnosis present

## 2024-03-19 DIAGNOSIS — Z6832 Body mass index (BMI) 32.0-32.9, adult: Secondary | ICD-10-CM

## 2024-03-19 DIAGNOSIS — I420 Dilated cardiomyopathy: Secondary | ICD-10-CM | POA: Diagnosis present

## 2024-03-19 DIAGNOSIS — I272 Pulmonary hypertension, unspecified: Secondary | ICD-10-CM | POA: Diagnosis present

## 2024-03-19 DIAGNOSIS — E119 Type 2 diabetes mellitus without complications: Secondary | ICD-10-CM

## 2024-03-19 DIAGNOSIS — I5022 Chronic systolic (congestive) heart failure: Secondary | ICD-10-CM

## 2024-03-19 DIAGNOSIS — I509 Heart failure, unspecified: Secondary | ICD-10-CM

## 2024-03-19 DIAGNOSIS — Z9049 Acquired absence of other specified parts of digestive tract: Secondary | ICD-10-CM

## 2024-03-19 DIAGNOSIS — I5042 Chronic combined systolic (congestive) and diastolic (congestive) heart failure: Secondary | ICD-10-CM | POA: Diagnosis present

## 2024-03-19 DIAGNOSIS — Z7984 Long term (current) use of oral hypoglycemic drugs: Secondary | ICD-10-CM

## 2024-03-19 DIAGNOSIS — D631 Anemia in chronic kidney disease: Secondary | ICD-10-CM | POA: Diagnosis present

## 2024-03-19 DIAGNOSIS — I5043 Acute on chronic combined systolic (congestive) and diastolic (congestive) heart failure: Secondary | ICD-10-CM | POA: Diagnosis present

## 2024-03-19 DIAGNOSIS — N179 Acute kidney failure, unspecified: Secondary | ICD-10-CM | POA: Diagnosis present

## 2024-03-19 DIAGNOSIS — Z951 Presence of aortocoronary bypass graft: Secondary | ICD-10-CM

## 2024-03-19 DIAGNOSIS — K219 Gastro-esophageal reflux disease without esophagitis: Secondary | ICD-10-CM | POA: Diagnosis present

## 2024-03-19 DIAGNOSIS — Z95810 Presence of automatic (implantable) cardiac defibrillator: Secondary | ICD-10-CM

## 2024-03-19 DIAGNOSIS — N1832 Chronic kidney disease, stage 3b: Secondary | ICD-10-CM | POA: Insufficient documentation

## 2024-03-19 DIAGNOSIS — E1122 Type 2 diabetes mellitus with diabetic chronic kidney disease: Secondary | ICD-10-CM | POA: Diagnosis present

## 2024-03-19 DIAGNOSIS — E878 Other disorders of electrolyte and fluid balance, not elsewhere classified: Secondary | ICD-10-CM | POA: Diagnosis present

## 2024-03-19 DIAGNOSIS — Z8269 Family history of other diseases of the musculoskeletal system and connective tissue: Secondary | ICD-10-CM

## 2024-03-19 DIAGNOSIS — Z8249 Family history of ischemic heart disease and other diseases of the circulatory system: Secondary | ICD-10-CM

## 2024-03-19 DIAGNOSIS — I130 Hypertensive heart and chronic kidney disease with heart failure and stage 1 through stage 4 chronic kidney disease, or unspecified chronic kidney disease: Principal | ICD-10-CM | POA: Diagnosis present

## 2024-03-19 DIAGNOSIS — E7800 Pure hypercholesterolemia, unspecified: Secondary | ICD-10-CM | POA: Diagnosis present

## 2024-03-19 DIAGNOSIS — I9589 Other hypotension: Secondary | ICD-10-CM | POA: Diagnosis present

## 2024-03-19 DIAGNOSIS — I255 Ischemic cardiomyopathy: Secondary | ICD-10-CM | POA: Diagnosis present

## 2024-03-19 DIAGNOSIS — Z820 Family history of epilepsy and other diseases of the nervous system: Secondary | ICD-10-CM

## 2024-03-19 DIAGNOSIS — E669 Obesity, unspecified: Secondary | ICD-10-CM | POA: Diagnosis present

## 2024-03-19 DIAGNOSIS — Z9842 Cataract extraction status, left eye: Secondary | ICD-10-CM

## 2024-03-19 DIAGNOSIS — Z7901 Long term (current) use of anticoagulants: Secondary | ICD-10-CM

## 2024-03-19 DIAGNOSIS — I480 Paroxysmal atrial fibrillation: Secondary | ICD-10-CM | POA: Diagnosis present

## 2024-03-19 DIAGNOSIS — I10 Essential (primary) hypertension: Secondary | ICD-10-CM | POA: Diagnosis present

## 2024-03-19 DIAGNOSIS — Z79899 Other long term (current) drug therapy: Secondary | ICD-10-CM

## 2024-03-19 DIAGNOSIS — I5020 Unspecified systolic (congestive) heart failure: Secondary | ICD-10-CM | POA: Diagnosis present

## 2024-03-19 DIAGNOSIS — Z8673 Personal history of transient ischemic attack (TIA), and cerebral infarction without residual deficits: Secondary | ICD-10-CM

## 2024-03-19 LAB — BASIC METABOLIC PANEL WITH GFR
Anion gap: 11 (ref 5–15)
BUN: 53 mg/dL — ABNORMAL HIGH (ref 8–23)
CO2: 26 mmol/L (ref 22–32)
Calcium: 8.8 mg/dL — ABNORMAL LOW (ref 8.9–10.3)
Chloride: 87 mmol/L — ABNORMAL LOW (ref 98–111)
Creatinine, Ser: 2.85 mg/dL — ABNORMAL HIGH (ref 0.61–1.24)
GFR, Estimated: 22 mL/min — ABNORMAL LOW (ref >60)
Glucose, Bld: 159 mg/dL — ABNORMAL HIGH (ref 70–99)
Potassium: 4.4 mmol/L (ref 3.5–5.1)
Sodium: 124 mmol/L — ABNORMAL LOW (ref 135–145)

## 2024-03-19 LAB — HEPATIC FUNCTION PANEL
ALT: 8 U/L (ref 0–44)
AST: 17 U/L (ref 15–41)
Albumin: 3.9 g/dL (ref 3.5–5.0)
Alkaline Phosphatase: 76 U/L (ref 38–126)
Bilirubin, Direct: 0.5 mg/dL — ABNORMAL HIGH (ref 0.0–0.2)
Indirect Bilirubin: 0.3 mg/dL (ref 0.3–0.9)
Total Bilirubin: 0.8 mg/dL (ref 0.0–1.2)
Total Protein: 6.2 g/dL — ABNORMAL LOW (ref 6.5–8.1)

## 2024-03-19 LAB — CBC
HCT: 34.6 % — ABNORMAL LOW (ref 39.0–52.0)
Hemoglobin: 11.2 g/dL — ABNORMAL LOW (ref 13.0–17.0)
MCH: 24.9 pg — ABNORMAL LOW (ref 26.0–34.0)
MCHC: 32.4 g/dL (ref 30.0–36.0)
MCV: 76.9 fL — ABNORMAL LOW (ref 80.0–100.0)
Platelets: 177 K/uL (ref 150–400)
RBC: 4.5 MIL/uL (ref 4.22–5.81)
RDW: 15.8 % — ABNORMAL HIGH (ref 11.5–15.5)
WBC: 6 K/uL (ref 4.0–10.5)
nRBC: 0 % (ref 0.0–0.2)

## 2024-03-19 LAB — RENAL FUNCTION PANEL
Albumin: 3.7 g/dL (ref 3.5–5.0)
Anion gap: 13 (ref 5–15)
BUN: 49 mg/dL — ABNORMAL HIGH (ref 8–23)
CO2: 24 mmol/L (ref 22–32)
Calcium: 8.7 mg/dL — ABNORMAL LOW (ref 8.9–10.3)
Chloride: 85 mmol/L — ABNORMAL LOW (ref 98–111)
Creatinine, Ser: 2.63 mg/dL — ABNORMAL HIGH (ref 0.61–1.24)
GFR, Estimated: 25 mL/min — ABNORMAL LOW (ref >60)
Glucose, Bld: 203 mg/dL — ABNORMAL HIGH (ref 70–99)
Phosphorus: 3.6 mg/dL (ref 2.5–4.6)
Potassium: 4.1 mmol/L (ref 3.5–5.1)
Sodium: 123 mmol/L — ABNORMAL LOW (ref 135–145)

## 2024-03-19 LAB — PHOSPHORUS: Phosphorus: 3.8 mg/dL (ref 2.5–4.6)

## 2024-03-19 LAB — MAGNESIUM: Magnesium: 1.7 mg/dL (ref 1.7–2.4)

## 2024-03-19 LAB — PRO BRAIN NATRIURETIC PEPTIDE: Pro Brain Natriuretic Peptide: 19467 pg/mL — ABNORMAL HIGH (ref <300.0)

## 2024-03-19 MED ORDER — METOPROLOL SUCCINATE ER 25 MG PO TB24
12.5000 mg | ORAL_TABLET | Freq: Every evening | ORAL | Status: DC
  Administered 2024-03-20 – 2024-03-21 (×2): 12.5 mg via ORAL
  Filled 2024-03-19 (×2): qty 1

## 2024-03-19 MED ORDER — MIDODRINE HCL 5 MG PO TABS
5.0000 mg | ORAL_TABLET | Freq: Three times a day (TID) | ORAL | Status: DC
  Administered 2024-03-19 – 2024-03-22 (×8): 5 mg via ORAL
  Filled 2024-03-19 (×9): qty 1

## 2024-03-19 MED ORDER — PANTOPRAZOLE SODIUM 40 MG PO TBEC
40.0000 mg | DELAYED_RELEASE_TABLET | Freq: Every day | ORAL | Status: DC
  Administered 2024-03-20 – 2024-03-24 (×5): 40 mg via ORAL
  Filled 2024-03-19 (×5): qty 1

## 2024-03-19 MED ORDER — ALBUTEROL SULFATE (2.5 MG/3ML) 0.083% IN NEBU
2.5000 mg | INHALATION_SOLUTION | Freq: Four times a day (QID) | RESPIRATORY_TRACT | Status: DC | PRN
  Administered 2024-03-19 – 2024-03-24 (×11): 2.5 mg via RESPIRATORY_TRACT
  Filled 2024-03-19 (×12): qty 3

## 2024-03-19 MED ORDER — ATORVASTATIN CALCIUM 80 MG PO TABS
80.0000 mg | ORAL_TABLET | Freq: Every day | ORAL | Status: DC
  Administered 2024-03-20 – 2024-03-24 (×5): 80 mg via ORAL
  Filled 2024-03-19 (×5): qty 1

## 2024-03-19 MED ORDER — APIXABAN 5 MG PO TABS
5.0000 mg | ORAL_TABLET | Freq: Two times a day (BID) | ORAL | Status: DC
  Administered 2024-03-19 – 2024-03-24 (×10): 5 mg via ORAL
  Filled 2024-03-19 (×10): qty 1

## 2024-03-19 MED ORDER — TAMSULOSIN HCL 0.4 MG PO CAPS
0.4000 mg | ORAL_CAPSULE | Freq: Every day | ORAL | Status: DC
  Administered 2024-03-20 – 2024-03-23 (×4): 0.4 mg via ORAL
  Filled 2024-03-19 (×4): qty 1

## 2024-03-19 MED ORDER — MELATONIN 5 MG PO TABS
5.0000 mg | ORAL_TABLET | Freq: Every evening | ORAL | Status: DC | PRN
  Administered 2024-03-19 – 2024-03-23 (×5): 5 mg via ORAL
  Filled 2024-03-19 (×5): qty 1

## 2024-03-19 MED ORDER — CALCITRIOL 0.25 MCG PO CAPS
0.2500 ug | ORAL_CAPSULE | Freq: Every day | ORAL | Status: DC
  Administered 2024-03-20 – 2024-03-24 (×5): 0.25 ug via ORAL
  Filled 2024-03-19 (×5): qty 1

## 2024-03-19 MED ORDER — SENNOSIDES-DOCUSATE SODIUM 8.6-50 MG PO TABS
1.0000 | ORAL_TABLET | Freq: Every evening | ORAL | Status: DC | PRN
  Administered 2024-03-23: 1 via ORAL
  Filled 2024-03-19: qty 1

## 2024-03-19 MED ORDER — ACETAMINOPHEN 325 MG PO TABS
650.0000 mg | ORAL_TABLET | Freq: Four times a day (QID) | ORAL | Status: DC | PRN
  Administered 2024-03-19 – 2024-03-21 (×3): 650 mg via ORAL
  Filled 2024-03-19 (×3): qty 2

## 2024-03-19 MED ORDER — SODIUM CHLORIDE 0.9% FLUSH
3.0000 mL | Freq: Two times a day (BID) | INTRAVENOUS | Status: DC
  Administered 2024-03-19 – 2024-03-21 (×4): 3 mL via INTRAVENOUS

## 2024-03-19 MED ORDER — FUROSEMIDE 10 MG/ML IJ SOLN
8.0000 mg/h | INTRAVENOUS | Status: AC
  Administered 2024-03-19: 8 mg/h via INTRAVENOUS
  Filled 2024-03-19: qty 20

## 2024-03-19 MED ORDER — ACETAMINOPHEN 650 MG RE SUPP: 650.0000 mg | Freq: Four times a day (QID) | RECTAL | Status: DC | PRN

## 2024-03-19 MED ORDER — MAGNESIUM SULFATE 4 GM/100ML IV SOLN
4.0000 g | Freq: Once | INTRAVENOUS | Status: AC
  Administered 2024-03-19: 4 g via INTRAVENOUS
  Filled 2024-03-19: qty 100

## 2024-03-19 NOTE — H&P (Addendum)
 History and Physical    Jermaine Kramer FMW:[id number] DOB: May 10, 1950 DOA: 03/19/2024  DOS: the patient was seen and examined on 03/19/2024  PCP: Perri Constance Sor, PA-C   Patient coming from: Clinic  I have personally briefly reviewed patient's old medical records in [facility] and CareEverywhere  HPI:   Jermaine Kramer is a 73 y.o. year old male with medical history of hypertension, hyperlipidemia c/b by CAD status post CABG, type 2 diabetes, HFrEF (EF 25% in 2024) status post ICD with pacemaker, atrial fibrillation presenting to the ED from his nephrology office for anasarca and complaints of weight gain and dyspnea.   Pt states he states he has been retaining fluid over the last few months. Pt reports urinating less and less over this time period. He has tried outpatient IV lasix  device at home about 7 weeks ago. He is taking his medications very regularly. Baseline weight is 180 lb. He is having some coughing with some sputum production. Denies any fevers, or chills.    On arrival to the ED patient was noted to be HDS stable.  Lab work and imaging obtained.  CBC with no leukocytosis, mild anemia at baseline.  BMP with hyponatremia at 124 with hypochloremia, AKI on CKD with creatinine at 2.8 with baseline around 2.  proBNP elevated at 19.4k.  Chest x-ray with trace bilateral pleural effusions, right upper quadrant obtained by EDP given concern for ascites and pending.  Given need for continued care, [facility] contacted for admission.  Review of Systems: As mentioned in the history of present illness. All other systems reviewed and are negative.   Past Medical History:  Diagnosis Date   Arrhythmia    atrial fibrillation   CHF (congestive heart failure) (HCC)    Coronary artery disease    Diabetes mellitus without complication (HCC)    GERD (gastroesophageal reflux disease)    Hypercholesteremia    Hypertension    Sleep apnea    Stroke [facility]    2015 or longer ago    Past  Surgical History:  Procedure Laterality Date   CATARACT EXTRACTION Bilateral 04/06/2022   CCM GENERATOR AND A/V LEAD INSERTION N/A 09/04/2022   Procedure: CCM GENERATOR AND A/V LEAD INSERTION;  Surgeon: Cindie Ole DASEN, MD;  Location: [facility] INVASIVE CV LAB;  Service: Cardiovascular;  Laterality: N/A;   CHOLECYSTECTOMY     COLONOSCOPY WITH PROPOFOL  N/A 04/14/2019   Procedure: COLONOSCOPY WITH PROPOFOL ;  Surgeon: Toledo, Ladell POUR, MD;  Location: [facility] ENDOSCOPY;  Service: Gastroenterology;  Laterality: N/A;   CORONARY ARTERY BYPASS GRAFT  02/16/2021   ICD IMPLANT N/A 06/22/2021   Procedure: ICD IMPLANT;  Surgeon: Cindie Ole DASEN, MD;  Location: [facility] INVASIVE CV LAB;  Service: Cardiovascular;  Laterality: N/A;   KNEE ARTHROSCOPY     RENAL CYST EXCISION     RIGHT HEART CATH Right 02/27/2024   Procedure: RIGHT HEART CATH;  Surgeon: Ammon Blunt, MD;  Location: [facility] INVASIVE CV LAB;  Service: Cardiovascular;  Laterality: Right;     Allergies  Allergen Reactions   Doxycycline Anaphylaxis    Patient does not recall having this reaction.     Heparin  Other (See Comments)    heparin -induced thrombocytopenia    Jardiance [Empagliflozin] Itching    Groin infection Caused a groin infection.      Family History  Problem Relation Age of Onset   Parkinson's disease Mother    Lupus Mother    Heart disease Father     Prior to  Admission medications   Medication Sig Start Date End Date Taking? Authorizing Provider  albuterol  (VENTOLIN  HFA) 108 (90 Base) MCG/ACT inhaler Inhale 2 puffs into the lungs every 6 (six) hours as needed for wheezing or shortness of breath. 02/13/24   Franchot Novel, MD  apixaban  (ELIQUIS ) 5 MG TABS tablet Take 1 tablet (5 mg total) by mouth 2 (two) times daily. 11/28/21   Donette Ellouise LABOR, FNP  atorvastatin  (LIPITOR ) 80 MG tablet Take 1 tablet (80 mg total) by mouth daily. 01/10/22   Donette Ellouise LABOR, FNP  bumetanide (BUMEX) 2 MG tablet Take 1 tablet (2 mg  total) by mouth daily. 02/21/24   Donette Ellouise LABOR, FNP  calcitRIOL (ROCALTROL) 0.25 MCG capsule Take 0.25 mcg by mouth daily. 12/17/23   [provider]  Continuous Glucose Sensor (FREESTYLE LIBRE 2 SENSOR) MISC by Does not apply route.    [provider]  Insulin  Pen Needle (COMFORT EZ PEN NEEDLES) 32G X 8 MM MISC by Other route. 07/16/23 07/15/24  [provider]  metFORMIN  (GLUCOPHAGE ) 500 MG tablet Take 500 mg by mouth 2 (two) times daily.    [provider]  metolazone  (ZAROXOLYN ) 2.5 MG tablet Take 1 tablet (2.5 mg total) by mouth daily as needed (worsening edema, SOB). Patient taking differently: Take 2.5 mg by mouth daily as needed (worsening edema, SOB). Every Friday 01/22/24   Fausto Burnard LABOR, DO  metoprolol  succinate (TOPROL -XL) 25 MG 24 hr tablet TAKE 1/2 TABLET BY MOUTH EVERY EVENING 06/25/23   Donette Ellouise A, FNP  midodrine  (PROAMATINE ) 5 MG tablet Take 1 tablet (5 mg total) by mouth 2 (two) times daily with a meal. 02/18/24   Clegg, Amy D, NP  omeprazole (PRILOSEC) 40 MG capsule Take 40 mg by mouth 2 (two) times daily. 03/11/21   [provider]  ondansetron  (ZOFRAN -ODT) 4 MG disintegrating tablet Take 1 tablet (4 mg total) by mouth every 8 (eight) hours as needed for nausea or vomiting. 04/16/23   Willo Dunnings, MD  spironolactone  (ALDACTONE ) 25 MG tablet Take 0.5 tablets (12.5 mg total) by mouth daily. 02/13/24   Franchot Novel, MD  tamsulosin  (FLOMAX ) 0.4 MG CAPS capsule Take 1 capsule (0.4 mg total) by mouth daily after supper. 01/22/24   Fausto Burnard LABOR, DO    Social History:  reports that he has never smoked. He has never used smokeless tobacco. He reports that he does not currently use alcohol. He reports that he does not use drugs. Lives with wife.  Tobacco- Denies use. EtOH- Denies use.  Illicit drug use- denies use.  IADLs/ADLs- Needs help with iADLs   Physical Exam: Vitals:   03/19/24 1500 03/19/24 1530 03/19/24 1600  03/19/24 1735  BP: 116/76 113/79 107/80 97/65  Pulse: 87 94 88 88  Resp: 17 13 (!) 21 (!) 25  Temp:      TempSrc:      SpO2: 94% 99% 95% 99%  Weight:      Height:         Gen: NAD HENT: NCAT CV: RRR, diminished pedal pulses, 3+ lower extremity edema, JVD present Resp: bibasilar rales Abd: No TTP, distended abdomen MSK: no asymmetry Skin: no lesions Neuro: alert and oriented x4 Psych: pleasant mood.   Labs on Admission: I have personally reviewed following labs and imaging studies  CBC: Recent Labs  Lab 03/19/24 1038  WBC 6.0  HGB 11.2*  HCT 34.6*  MCV 76.9*  PLT 177   Basic Metabolic Panel: Recent Labs  Lab  03/19/24 1038  NA 124*  K 4.4  CL 87*  CO2 26  GLUCOSE 159*  BUN 53*  CREATININE 2.85*  CALCIUM  8.8*  MG 1.7  PHOS 3.8   GFR: Estimated Creatinine Clearance: 22.8 mL/min (A) (by C-G formula based on SCr of 2.85 mg/dL (H)). Liver Function Tests: Recent Labs  Lab 03/19/24 1038  AST 17  ALT 8  ALKPHOS 76  BILITOT 0.8  PROT 6.2*  ALBUMIN 3.9   No results for input(s): LIPASE, AMYLASE in the last 168 hours. No results for input(s): AMMONIA in the last 168 hours. Coagulation Profile: No results for input(s): INR, PROTIME in the last 168 hours. Cardiac Enzymes: No results for input(s): CKTOTAL, CKMB, CKMBINDEX, TROPONINI, TROPONINIHS in the last 168 hours. BNP (last 3 results) Recent Labs    01/19/24 0721 02/01/24 1118 02/09/24 0921  BNP 1,161.0* 941.8* 1,871.3*   HbA1C: No results for input(s): HGBA1C in the last 72 hours. CBG: No results for input(s): GLUCAP in the last 168 hours. Lipid Profile: No results for input(s): CHOL, HDL, LDLCALC, TRIG, CHOLHDL, LDLDIRECT in the last 72 hours. Thyroid Function Tests: No results for input(s): TSH, T4TOTAL, FREET4, T3FREE, THYROIDAB in the last 72 hours. Anemia Panel: No results for input(s): VITAMINB12, FOLATE, FERRITIN, TIBC, IRON,  RETICCTPCT in the last 72 hours. Urine analysis:    Component Value Date/Time   COLORURINE YELLOW (A) 04/05/2023 1925   APPEARANCEUR CLEAR (A) 04/05/2023 1925   APPEARANCEUR CLEAR 01/15/2013 1659   LABSPEC 1.011 04/05/2023 1925   LABSPEC 1.005 01/15/2013 1659   PHURINE 5.0 04/05/2023 1925   GLUCOSEU NEGATIVE 04/05/2023 1925   GLUCOSEU NEGATIVE 01/15/2013 1659   HGBUR SMALL (A) 04/05/2023 1925   BILIRUBINUR NEGATIVE 04/05/2023 1925   BILIRUBINUR NEGATIVE 01/15/2013 1659   KETONESUR NEGATIVE 04/05/2023 1925   PROTEINUR NEGATIVE 04/05/2023 1925   NITRITE NEGATIVE 04/05/2023 1925   LEUKOCYTESUR TRACE (A) 04/05/2023 1925   LEUKOCYTESUR 1+ 01/15/2013 1659    Radiological Exams on Admission: I have personally reviewed images US  ABDOMEN LIMITED RUQ (LIVER/GB) Result Date: 03/19/2024 EXAM: Right Upper Quadrant Abdominal Ultrasound 03/19/2024 02:54:40 PM TECHNIQUE: Real-time ultrasonography of the right upper quadrant of the abdomen was performed. COMPARISON: US  Abdomen 01/19/2024. CLINICAL HISTORY: Ascites, cholecystectomy. FINDINGS: LIVER: The liver demonstrates normal echogenicity. No intrahepatic biliary ductal dilatation. No evidence of mass. Mild ascites is noted in the right upper quadrant around the liver. BILIARY SYSTEM: Status post cholecystectomy. Common bile duct is within normal limits measuring 3.1 mm. IMPRESSION: 1. Mild ascites in the right upper quadrant around the liver. 2. Status post cholecystectomy. Electronically signed by: Lynwood Seip MD 03/19/2024 03:24 PM EST RP Workstation: [id number]   DG Chest 2 View Result Date: 03/19/2024 CLINICAL DATA:  Shortness of breath. EXAM: DG CHEST 2V COMPARISON:  Chest [place]  dated 02/11/2024. FINDINGS: Small right pleural effusion and right lung base atelectasis. Trace left pleural effusion suspected pneumonia is not excluded. No pneumothorax. Stable cardiomegaly. Bilateral pectoral pacemaker devices. Median sternotomy wires. No acute  osseous pathology. IMPRESSION: Small right and trace left pleural effusion and right lung base atelectasis or infiltrate, relatively similar to prior radiograph. Electronically Signed   By: Vanetta Chou M.D.   On: 03/19/2024 11:39    EKG: My personal interpretation of EKG shows: Pending  Assessment/Plan Principal Problem:   Acute exacerbation of CHF (congestive heart failure) (HCC) Active Problems:   GERD (gastroesophageal reflux disease)   Hypertension   HFrEF (heart failure with reduced ejection fraction) (HCC)   CAD s/p  CABG x 3   Chronic combined systolic and diastolic congestive heart failure (HCC)   Atrial fibrillation, chronic (HCC)   BPH (benign prostatic hyperplasia)   DM2 (diabetes mellitus, type 2) (HCC)   Acute CHF exacerbation Pt with significant volume overload in setting of decreased urine output resulting in >10lb weight increase admitted for HF exacerbation. He is taking OP diuretic bumex. Admitted for IV diuresis. Cardiology consulted. Appreciate their recommendations. He is currently getting lasix  gtt and reports urine output. Will consult nephrology as well given developing AKI which may be secondary to vascular congestion.  Will continue Lasix  drip and replete electrolytes aggressively.  Did BMP in the evening to ensure potassium is repleted.  He may have a slight creatinine bump which can be expected with with diuresis but it showed improved. Renal panel q6hrs. Daily weights and strict I and O.   AKI on CKD 3B: Likely secondary to vascular congestion.  Getting IV diuresis.  There is concern for cardiorenal but given the patient is well-perfused and not hypoxic former is unlikely etiology.  Renally dose medications, avoid nephrotoxic agents and trend creatinine daily.  Hypertension: Holding home regimen given the active diuresis.  Currently normotensive.  He is on midodrine  for hypotension so we will request cardiology and nephrology given input regarding his  antihypertensives to see if they can be weaned especially his spironolactone .  Paroxysmal A-fib: In sinus rhythm.  Continue home anticoagulation.  Holding beta-blocker for now but this can be restarted if blood pressure remains stable.  GERD: Resume home PPI.   Type 2 diabetes: Will discontinue the metformin  altogether given GFR is less than 45 and currently less than 30.  At this point the risk outweigh the benefits.  Per wife he is not able to have SGLT2 inhibitor given he had multiple infections with it.  He may benefit from GLP 1 given his obesity.  BPH: Continue home Flomax   CAD/HLD: Continue home statin    VTE prophylaxis:  Eliquis   Diet: Heart healthy Code Status:  Full Code Telemetry:  Admission status: Inpatient, Progressive Patient is from: Home Anticipated d/c is to: Home Anticipated d/c is in: 2-3 days   Family Communication: Updated at bedside  Consults called: Cardiology, Nephrology   Severity of Illness: The appropriate patient status for this patient is INPATIENT. Inpatient status is judged to be reasonable and necessary in order to provide the required intensity of service to ensure the patient's safety. The patient's presenting symptoms, physical exam findings, and initial radiographic and laboratory data in the context of their chronic comorbidities is felt to place them at high risk for further clinical deterioration. Furthermore, it is not anticipated that the patient will be medically stable for discharge from the hospital within 2 midnights of admission.   * I certify that at the point of admission it is my clinical judgment that the patient will require inpatient hospital care spanning beyond 2 midnights from the point of admission due to high intensity of service, high risk for further deterioration and high frequency of surveillance required.Jermaine Morene Bathe, MD [facility]

## 2024-03-19 NOTE — ED Provider Notes (Signed)
 [facility] Provider Note    Event Date/Time   First MD Initiated Contact with Patient 03/19/24 1328     (approximate)   History   Shortness of Breath   HPI  Jermaine Kramer is a 73 y.o. male   history of congestive heart failure coronary disease diabetes followed by nephrology as well  Patient presented nephrology clinic today, please see my telephone note.  He was sent for concerns of severe hypervolemia in the setting of worsening weight gain and anasarca/edema.  His nephrologist advising attempts at IV diuresis unsuccessful, feels patient needs to be initiated on Lasix  infusion  Patient reports increasing weight gain shortness of breath cannot get fluid off for several days now.  Despite use of his diuretic  No chest pain some shortness of breath when he exerts himself primarily.  No fevers or chills.  His abdomen he reports is always fairly swollen but seems a little more swollen than normal as well as swelling into both lower legs and reports has had the same due to fluid buildup      Physical Exam   Triage Vital Signs: ED Triage Vitals  Encounter Vitals Group     BP 03/19/24 1036 95/63     Girls Systolic BP Percentile --      Girls Diastolic BP Percentile --      Boys Systolic BP Percentile --      Boys Diastolic BP Percentile --      Pulse Rate 03/19/24 1036 80     Resp 03/19/24 1036 20     Temp 03/19/24 1036 97.9 F (36.6 C)     Temp Source 03/19/24 1036 Oral     SpO2 03/19/24 1036 96 %     Weight 03/19/24 1035 194 lb (88 kg)     Height 03/19/24 1035 5' 5 (1.651 m)     Head Circumference --      Peak Flow --      Pain Score 03/19/24 1035 0     Pain Loc --      Pain Education --      Exclude from Growth Chart --     Most recent vital signs: Vitals:   03/19/24 1500 03/19/24 1530  BP: 116/76 113/79  Pulse: 87 94  Resp: 17 13  Temp:    SpO2: 94% 99%     General: Awake, no distress.  CV:  Good peripheral perfusion.  Normal  tones Resp:  Normal effort.  Diminished in the bases bilateral.  No frank rales Abd:  Abdomen mildly distended but not tender.  Fluid wave suggestive perhaps mild ascites.  No focal discomfort Other:  Moderate bilateral lower extremity edema but also notable element of what appears to be body wall anasarca   ED Results / Procedures / Treatments   Labs (all labs ordered are listed, but only abnormal results are displayed) Labs Reviewed  BASIC METABOLIC PANEL WITH GFR - Abnormal; Notable for the following components:      Result Value   Sodium 124 (*)    Chloride 87 (*)    Glucose, Bld 159 (*)    BUN 53 (*)    Creatinine, Ser 2.85 (*)    Calcium  8.8 (*)    GFR, Estimated 22 (*)    All other components within normal limits  CBC - Abnormal; Notable for the following components:   Hemoglobin 11.2 (*)    HCT 34.6 (*)    MCV 76.9 (*)  MCH 24.9 (*)    RDW 15.8 (*)    All other components within normal limits  PRO BRAIN NATRIURETIC PEPTIDE - Abnormal; Notable for the following components:   Pro Brain Natriuretic Peptide 19,467.0 (*)    All other components within normal limits  HEPATIC FUNCTION PANEL - Abnormal; Notable for the following components:   Total Protein 6.2 (*)    Bilirubin, Direct 0.5 (*)    All other components within normal limits  MAGNESIUM   PHOSPHORUS   Labs notable for significant hyponatremia.  Chronic renal disease slightly worse than normal.  Consulted with our nephrologist, advises with his sodium and examination recommendation for Lasix  drip  EKG  Some underlying artifact, appears to be sinus rhythm with right bundle branch block without frank evidence of ischemia   RADIOLOGY  Chest x-ray inter by me as mild bilateral pleural effusions, difficult to exclude infiltrate at the right base     DG Chest 2 View Result Date: 03/19/2024 CLINICAL DATA:  Shortness of breath. EXAM: DG CHEST 2V COMPARISON:  Chest [place]  dated 02/11/2024. FINDINGS: Small  right pleural effusion and right lung base atelectasis. Trace left pleural effusion suspected pneumonia is not excluded. No pneumothorax. Stable cardiomegaly. Bilateral pectoral pacemaker devices. Median sternotomy wires. No acute osseous pathology. IMPRESSION: Small right and trace left pleural effusion and right lung base atelectasis or infiltrate, relatively similar to prior radiograph. Electronically Signed   By: Vanetta Chou M.D.   On: 03/19/2024 11:39      PROCEDURES:  Critical Care performed: No  Procedures   MEDICATIONS ORDERED IN ED: Medications  furosemide  (LASIX ) 200 mg in dextrose  5 % 100 mL (2 mg/mL) infusion (8 mg/hr Intravenous New Bag/Given 03/19/24 1424)  sodium chloride  flush (NS) 0.9 % injection 3 mL (has no administration in time range)  acetaminophen  (TYLENOL ) tablet 650 mg (has no administration in time range)    Or  acetaminophen  (TYLENOL ) suppository 650 mg (has no administration in time range)  senna-docusate (Senokot-S) tablet 1 tablet (has no administration in time range)  albuterol  (PROVENTIL ) (2.5 MG/3ML) 0.083% nebulizer solution 2.5 mg (has no administration in time range)     IMPRESSION / MDM / ASSESSMENT AND PLAN / ED COURSE  I reviewed the triage vital signs and the nursing notes.                              Differential diagnosis includes, but is not limited to, anasarca, CHF volume overload cirrhosis given the abdominal exam, low protein, nephrotic syndrome etc.  No associated chest pain slight shortness of breath.  He appears grossly volume overload is been followed by nephrology and not doing well with outpatient diuresis and for further.  There is hyponatremia and exam suggestive of volume overload, consult with her nephrologist and I will initiate a Lasix  infusion.  I consulted with her hospitalist who is agreeable with admission, admitted to Dr. Fernand dense will follow-up on ultrasound results to evaluate for ascites or liver disease], though I  suspect this is likely secondary to cardiomyopathy and CHF.  Patient is not in acute extremis but certainly appears volume overloaded.  Nephrology consulting  Patient's presentation is most consistent with acute complicated illness / injury requiring diagnostic workup.   The patient is on the cardiac monitor to evaluate for evidence of arrhythmia and/or significant heart rate changes.  Patient agreeable with plan for admission  Clinical Course as of 03/19/24 1619  Wed Mar 19, 2024  1502 Consulted with nephrology, will initiate Lasix  infusion.  Also consult with patient admitted to hospitalist service under the care of Dr. Fernand.  Patient very agreeable he was expecting both admission and Lasix  infusion  Hospitalist will follow-up on ultrasound to evaluate for right upper quadrant/cirrhotic type pathology or ascites. [MQ]    Clinical Course User Index [MQ] Dicky Anes, MD     FINAL CLINICAL IMPRESSION(S) / ED DIAGNOSES   Final diagnoses:  Anasarca  Acute congestive heart failure, unspecified heart failure type (HCC)     Rx / DC Orders   ED Discharge Orders     None        Note:  This document was prepared using Dragon voice recognition software and may include unintentional dictation errors.   Dicky Anes, MD 03/19/24 [phone]

## 2024-03-19 NOTE — ED Triage Notes (Signed)
 Patient states shortness of breath and edema to bilateral legs; sent over by Nephrologist.

## 2024-03-19 NOTE — ED Notes (Signed)
 Meal tray delivered at this time.

## 2024-03-19 NOTE — ED Notes (Signed)
 Resumed care from [facility] t rn.  Pt alert  family with pt.  Pt waiting on admission.  Pt has sob. Iv meds infusing.  Md at bedside.

## 2024-03-19 NOTE — ED Notes (Signed)
 US  at bedside.

## 2024-03-19 NOTE — Telephone Encounter (Signed)
 Dr. Marcelino sending patient from clinic due to concerns of dyspnea, weight gain. Recently tried lasix  without improvement outpatient. Will be coming to ER with his wife. Dr. Marcelino recommends consider lasix  gtt (8mg /hr) and consult hospitalist, cardiology. Dr. Marcelino notifying Dr. VEAR Stank too.

## 2024-03-19 NOTE — ED Notes (Signed)
 Cardiology at bedside.

## 2024-03-19 NOTE — Consult Note (Signed)
 [facility] CARDIOLOGY CONSULT NOTE       Patient ID: Jermaine Kramer MRN: [id number] DOB/AGE: 05/05/1950 73 y.o.  Admit date: 03/19/2024 Referring Physician Dr. Morene Bathe Primary Physician Perri, Mychal Burnard, PA-C  Primary Cardiologist Dr. Ammon Reason for Consultation AF CHF  HPI: KEAN GAUTREAU is a 73 y.o. male  with a past medical history of  CAD (s/p recent CABG with LIMA to LAD, SVG to ramus and SVG to PDA on 02/16/2021 with Dr. Leanor), postoperative atrial fibrillation (on amiodarone  and Eliquis , dilated cardiomyopathy with LVEF 20-25%, mod MR, 07/22/2021 s/p dual chamber ICD 06/22/21, history of frequent PVCs, type 2 diabetes, history of TIA, history of penile cancer, hypertension  who presented to the ED on 03/19/2024 for swelling. Cardiology was consulted for further evaluation.   Patient presented to the ED with worsening swelling in legs and abdomen for 1 to 2 weeks.  Saw nephrology earlier and was advised to come in for evaluation.  Workup in the ED notable for creatinine 2.85, potassium 4.4, hemoglobin 11.2, WBC 6.0. BNP 19,467. EKG in the ED normal sinus rhythm right bundle blanch block rate 83 bpm.  US  abdomen with mild ascites.  Chest x-ray with small right and trace left pleural effusions.  Started on IV Lasix  infusion in the ED.  Review of systems complete and found to be negative unless listed above    Past Medical History:  Diagnosis Date   Arrhythmia    atrial fibrillation   CHF (congestive heart failure) (HCC)    Coronary artery disease    Diabetes mellitus without complication (HCC)    GERD (gastroesophageal reflux disease)    Hypercholesteremia    Hypertension    Sleep apnea    Stroke [facility])    2015 or longer ago    Past Surgical History:  Procedure Laterality Date   CATARACT EXTRACTION Bilateral 04/06/2022   CCM GENERATOR AND A/V LEAD INSERTION N/A 09/04/2022   Procedure: CCM GENERATOR AND A/V LEAD INSERTION;  Surgeon: Cindie Ole DASEN, MD;   Location: [facility] INVASIVE CV LAB;  Service: Cardiovascular;  Laterality: N/A;   CHOLECYSTECTOMY     COLONOSCOPY WITH PROPOFOL  N/A 04/14/2019   Procedure: COLONOSCOPY WITH PROPOFOL ;  Surgeon: Toledo, Ladell POUR, MD;  Location: [facility] ENDOSCOPY;  Service: Gastroenterology;  Laterality: N/A;   CORONARY ARTERY BYPASS GRAFT  02/16/2021   ICD IMPLANT N/A 06/22/2021   Procedure: ICD IMPLANT;  Surgeon: Cindie Ole DASEN, MD;  Location: [facility] INVASIVE CV LAB;  Service: Cardiovascular;  Laterality: N/A;   KNEE ARTHROSCOPY     RENAL CYST EXCISION     RIGHT HEART CATH Right 02/27/2024   Procedure: RIGHT HEART CATH;  Surgeon: Ammon Blunt, MD;  Location: [facility] INVASIVE CV LAB;  Service: Cardiovascular;  Laterality: Right;    (Not in a hospital admission)  Social History   Socioeconomic History   Marital status: Married    Spouse name: Not on file   Number of children: Not on file   Years of education: Not on file   Highest education level: Not on file  Occupational History   Not on file  Tobacco Use   Smoking status: Never   Smokeless tobacco: Never  Vaping Use   Vaping status: Never Used  Substance and Sexual Activity   Alcohol use: Not Currently   Drug use: No   Sexual activity: Not Currently  Other Topics Concern   Not on file  Social History Narrative   Not on file   Social  Drivers of Corporate Investment Banker Strain: Low Risk  (03/12/2024)   Received from [facility]   Overall Financial Resource Strain (CARDIA)    Difficulty of Paying Living Expenses: Not hard at all  Food Insecurity: No Food Insecurity (03/12/2024)   Received from [facility]   Hunger Vital Sign    Within the past 12 months, you worried that your food would run out before you got the money to buy more.: Never true    Within the past 12 months, the food you bought just didn't last and you didn't have money to get more.: Never true  Transportation Needs: No Transportation  Needs (03/12/2024)   Received from [facility] - Transportation    In the past 12 months, has lack of transportation kept you from medical appointments or from getting medications?: No    Lack of Transportation (Non-Medical): No  Physical Activity: Not on file  Stress: Not on file  Social Connections: Moderately Integrated (02/10/2024)   Social Connection and Isolation Panel    Frequency of Communication with Friends and Family: More than three times a week    Frequency of Social Gatherings with Friends and Family: More than three times a week    Attends Religious Services: More than 4 times per year    Active Member of [employer] or Organizations: No    Attends Banker Meetings: Never    Marital Status: Married  Catering Manager Violence: Not At Risk (02/10/2024)   Humiliation, Afraid, Rape, and Kick questionnaire    Fear of Current or Ex-Partner: No    Emotionally Abused: No    Physically Abused: No    Sexually Abused: No    Family History  Problem Relation Age of Onset   Parkinson's disease Mother    Lupus Mother    Heart disease Father      Vitals:   03/19/24 1400 03/19/24 1455 03/19/24 1500 03/19/24 1530  BP: 103/73  116/76 113/79  Pulse: 76  87 94  Resp: 14  17 13   Temp:  97.9 F (36.6 C)    TempSrc:  Oral    SpO2: 100%  94% 99%  Weight:      Height:        PHYSICAL EXAM General: Chronically ill-appearing male, well nourished, in no acute distress. HEENT: Normocephalic and atraumatic. Neck: No JVD.  Lungs: Normal respiratory effort on room air. Clear bilaterally to auscultation. No wheezes, crackles, rhonchi.  Heart: HRRR. Normal S1 and S2 without gallops or murmurs.  Abdomen: Distended. Msk: Normal strength and tone for age. Extremities: Warm and well perfused. No clubbing, cyanosis.  3+ pitting edema.  Neuro: Alert and oriented X 3. Psych: Answers questions appropriately.   Labs: Basic Metabolic Panel: Recent Labs     03/19/24 1038  NA 124*  K 4.4  CL 87*  CO2 26  GLUCOSE 159*  BUN 53*  CREATININE 2.85*  CALCIUM  8.8*  MG 1.7  PHOS 3.8   Liver Function Tests: Recent Labs    03/19/24 1038  AST 17  ALT 8  ALKPHOS 76  BILITOT 0.8  PROT 6.2*  ALBUMIN 3.9   No results for input(s): LIPASE, AMYLASE in the last 72 hours. CBC: Recent Labs    03/19/24 1038  WBC 6.0  HGB 11.2*  HCT 34.6*  MCV 76.9*  PLT 177   Cardiac Enzymes: No results for input(s): CKTOTAL, CKMB, CKMBINDEX, TROPONINIHS in the last 72  hours. BNP: No results for input(s): BNP in the last 72 hours. D-Dimer: No results for input(s): DDIMER in the last 72 hours. Hemoglobin A1C: No results for input(s): HGBA1C in the last 72 hours. Fasting Lipid Panel: No results for input(s): CHOL, HDL, LDLCALC, TRIG, CHOLHDL, LDLDIRECT in the last 72 hours. Thyroid Function Tests: No results for input(s): TSH, T4TOTAL, T3FREE, THYROIDAB in the last 72 hours.  Invalid input(s): FREET3 Anemia Panel: No results for input(s): VITAMINB12, FOLATE, FERRITIN, TIBC, IRON, RETICCTPCT in the last 72 hours.   Radiology: US  ABDOMEN LIMITED RUQ (LIVER/GB) Result Date: 03/19/2024 EXAM: Right Upper Quadrant Abdominal Ultrasound 03/19/2024 02:54:40 PM TECHNIQUE: Real-time ultrasonography of the right upper quadrant of the abdomen was performed. COMPARISON: US  Abdomen 01/19/2024. CLINICAL HISTORY: Ascites, cholecystectomy. FINDINGS: LIVER: The liver demonstrates normal echogenicity. No intrahepatic biliary ductal dilatation. No evidence of mass. Mild ascites is noted in the right upper quadrant around the liver. BILIARY SYSTEM: Status post cholecystectomy. Common bile duct is within normal limits measuring 3.1 mm. IMPRESSION: 1. Mild ascites in the right upper quadrant around the liver. 2. Status post cholecystectomy. Electronically signed by: Lynwood Seip MD 03/19/2024 03:24 PM EST RP Workstation:  [id number]   DG Chest 2 View Result Date: 03/19/2024 CLINICAL DATA:  Shortness of breath. EXAM: DG CHEST 2V COMPARISON:  Chest [place]  dated 02/11/2024. FINDINGS: Small right pleural effusion and right lung base atelectasis. Trace left pleural effusion suspected pneumonia is not excluded. No pneumothorax. Stable cardiomegaly. Bilateral pectoral pacemaker devices. Median sternotomy wires. No acute osseous pathology. IMPRESSION: Small right and trace left pleural effusion and right lung base atelectasis or infiltrate, relatively similar to prior radiograph. Electronically Signed   By: Vanetta Chou M.D.   On: 03/19/2024 11:39   CARDIAC CATHETERIZATION Result Date: 02/27/2024   Hemodynamic findings consistent with moderate pulmonary hypertension.     RA   27/26,  22 mmHg           RV   49/6,    21 mmHg           PA   54/29,  37 mmHg           PW  29/35,  27 mmHg           CO 2.65           CI  1.39 2.  Moderate pulmonary hypertension    ECHO 03/2024: SEVERE LEFT VENTRICULAR SYSTOLIC DYSFUNCTION WITH NO LVH, LV SIZE IS MODERATELY ENLARGED  ESTIMATED EF: 25%, CALC EF(2D): 31%  NORMAL LA PRESSURES WITH NORMAL DIASTOLIC FUNCTION  MILD RIGHT VENTRICULAR SYSTOLIC DYSFUNCTION  VALVULAR REGURGITATION: No AR, MODERATE MR, TRIVIAL PR, MODERATE TR                       NO VALVULAR STENOSIS         TELEMETRY (personally reviewed): Off telemetry  EKG (personally reviewed): normal sinus rhythm right bundle blanch block rate 83 bpm  Data reviewed by me 03/19/2024: last 24h vitals tele labs imaging I/O ED provider note, admission H&P  Principal Problem:   Acute exacerbation of CHF (congestive heart failure) (HCC) Active Problems:   GERD (gastroesophageal reflux disease)   Hypertension   HFrEF (heart failure with reduced ejection fraction) (HCC)   CAD s/p CABG x 3   Chronic combined systolic and diastolic congestive heart failure (HCC)   Atrial fibrillation, chronic (HCC)   BPH (benign prostatic  hyperplasia)   DM2 (diabetes mellitus, type 2) (HCC)  ASSESSMENT AND PLAN:  KILAN BANFILL is a 73 y.o. male  with a past medical history of  CAD (s/p recent CABG with LIMA to LAD, SVG to ramus and SVG to PDA on 02/16/2021 with Dr. Leanor), postoperative atrial fibrillation (on amiodarone  and Eliquis , dilated cardiomyopathy with LVEF 20-25%, mod MR, 07/22/2021 s/p dual chamber ICD 06/22/21, history of frequent PVCs, type 2 diabetes, history of TIA, history of penile cancer, hypertension  who presented to the ED on 03/19/2024 for swelling. Cardiology was consulted for further evaluation.   # Acute on chronic HFrEF # Coronary artery disease s/p CABG 2022 # Ischemic cardiomyopathy s/p ICD # Chronic kidney disease stage IIIb # Paroxysmal atrial fibrillation Presented with worsening LE edema and abdominal distention. BNP elevated at 19,000. Started on IV Lasix  infusion in the ED.  - Agree with IV Lasix  infusion 8 mg/h. - Can consider doses of metolazone  pending renal function. - Will defer resuming spironolactone , metoprolol  at this time.  Consider resuming during admission pending BP, renal function, volume status. - Resume Eliquis  5 mg bid.  - Resume atorvastatin  80 mg daily.    This patient's plan of care was discussed and created with Dr. Florencio and he is in agreement.  Signed: Danita Bloch, PA-C  03/19/2024, 4:22 PM [facility] Cardiology

## 2024-03-19 NOTE — ED Notes (Signed)
 Acuity changed due to BNP result

## 2024-03-19 NOTE — Progress Notes (Signed)
 Follow Up Visit   Patient Name: Jermaine Kramer, male   Patient DOB: 1950/09/25 Date of Service: 03/19/2024  Patient MRN: [id number] Provider Creating Note: Bonnell Sherry, MD  [phone] Primary Care Physician:   [address] [place] [address] Additional Physicians/ Providers:    History of Present Illness Jermaine Kramer is a 73 y.o. male who is following up today for diabetes mellitus type 2 with chronic kidney disease, chronic kidney disease stage IV, positive ANA, hypertension, secondary hyperparathyroidism, and anemia of chronic kidney disease.  Patient requested early visit as he appears to have worsening lower extremity edema, shortness of breath, and increasing weight.  His renal function appears to be stable most recent eGFR 24.  In regards hypertension blood pressure currently 103/79.  He also has secondary hyperparathyroidism with most recent PTH 103, phosphorus 3.7, calcium  9.4.  He also has anemia of chronic kidney disease most recent hemoglobin 11.2.  In regards to his underlying chronic systolic heart failure weight is up to 193 pounds.  Recent EF was 31%.  He already tried Lasix  injection Monday at the hospital.  Medications   Current Outpatient Medications:  .  albuterol  HFA (PROVENTIL  HFA;VENTOLIN  HFA) 108 (90 Base) MCG/ACT inhaler, Inhale 2 puffs, Disp: , Rfl:  .  apixaban  (ELIQUIS ) 5 MG tablet, Take 5 mg by mouth in the morning and 5 mg in the evening., Disp: , Rfl:  .  atorvastatin  (LIPITOR ) 80 MG tablet, Take 1 tablet by mouth 1 (one) time each day, Disp: , Rfl:  .  bumetanide  (BUMEX ) 2 MG tablet, Take 2 mg by mouth 1 (one) time each day, Disp: , Rfl:  .  calcitriol  (ROCALTROL ) 0.25 MCG capsule, TAKE 1 CAPSULE (0.25 MCG TOTAL) BY MOUTH 1 (ONE) TIME EACH DAY, Disp: 90 capsule, Rfl: 3 .  furosemide  (LASIX ) 80 MG tablet, Take 80 mg by mouth in the morning and 80 mg in the evening., Disp: , Rfl:  .  metFORMIN  (GLUCOPHAGE ) 500 MG tablet, Take 500 mg by mouth in the  morning and 500 mg in the evening., Disp: , Rfl:  .  metOLazone  2.5 MG tablet, Take 2.5 mg by mouth, Disp: , Rfl:  .  metoprolol  succinate XL (TOPROL  XL) 25 MG 24 hr tablet, Take 12.5 mg by mouth in the morning and 12.5 mg in the evening., Disp: , Rfl:  .  midodrine  (PROAMATINE ) 5 MG tablet, Take 5 mg by mouth in the morning and 5 mg in the evening., Disp: , Rfl:  .  omeprazole (PriLOSEC) 40 MG DR capsule, Take 40 mg by mouth in the morning and 40 mg in the evening., Disp: , Rfl:  .  ondansetron  (ZOFRAN ) 4 MG tablet, Take 4 mg by mouth every 8 (eight) hours if needed, Disp: , Rfl:  .  spironolactone  (ALDACTONE ) 25 MG tablet, Take 12.5 mg by mouth 1 (one) time each day, Disp: , Rfl:  .  tamsulosin  (FLOMAX ) 0.4 MG 24 hr capsule, Take 0.8 mg by mouth 1 (one) time each day, Disp: , Rfl:    Allergies Doxycycline, Heparin , and Empagliflozin  Problem List There is no problem list on file for this patient.    Review of Systems  Constitutional:  Negative for chills, fever and malaise/fatigue.  Respiratory:  Negative for cough and shortness of breath.   Cardiovascular:  Negative for chest pain, palpitations and leg swelling.  Gastrointestinal:  Negative for nausea and vomiting.  Genitourinary:  Negative for dysuria, hematuria and urgency.  History Past Medical History:  Diagnosis Date  . Acquired cyst of kidney    Left  . Acute congestive heart failure (HCC)   . Acute kidney failure (HCC)   . Acute parotitis   . Acute pulmonary edema (HCC)   . Acute respiratory failure with hypoxia (HCC)   . Altered mental status   . Benign prostatic hyperplasia   . Bradycardia   . Cerebral artery occlusion, unspecified, with cerebral infarction (HCC)   . Chronic atrial fibrillation (HCC)   . Chronic combined systolic and diastolic congestive heart failure (HCC)   . Diabetes mellitus without mention of complication, type II or unspecified type, not stated as uncontrolled (HCC)   . Dilated  cardiomyopathy (HCC)   . Epididymo-orchitis   . Esophageal reflux   . Essential hypertension   . Heart failure with reduced ejection fraction (HCC)   . Hypokalemia   . Hypotension   . Incomplete emptying of urinary bladder   . Ischemic cardiomyopathy   . Metabolic acidosis   . Other and unspecified hyperlipidemia   . Retention of urine   . Sclerosing mesenteritis (HCC)   . Squamous cell carcinoma of penis (HCC)   . Thrombocytopenia (HCC)     Past Surgical History:  Procedure Laterality Date  . GALLBLADDER SURGERY    . KNEE ARTHROSCOPY     Family History  Problem Relation Age of Onset  . Heart disease Mother   . Stroke Mother   . Heart disease Father   . Diabetes Sister    Social History   Tobacco Use  . Smoking status: Never  . Smokeless tobacco: Former    Types: Chew  Substance Use Topics  . Alcohol use: Not Currently        Physical Exam  Vitals BP 103/79 (BP Location: Right upper arm, Patient Position: Sitting)   Pulse 80   Temp 97.8 F   Wt 193 lb (87.5 kg)   SpO2 97%   BMI 32.12 kg/m   PHYSICAL EXAM: General appearance: well developed, well nourished, NAD Eyes: anicteric sclerae, moist conjunctivae; no lid-lag  HENT: Atraumatic; hearing intact Neck: Trachea midline; supple Lungs: Basilar rales bilaterally CV: S1S2, irregular Abdomen: Soft, non-tender; bowel sounds present Extremities: 2+ bilateral lower extremity edema Skin: Warm and dry, normal skin turgor, no rashes noted. Psych: Appropriate affect, alert and oriented to person, place and time    Laboratory Studies  Chemistry  Lab Units 02/25/24 1151 02/19/24 0849 11/19/23 1016 10/24/23 0919 07/23/23 0913 07/16/23 0843 04/16/23 1016 03/22/23 0923 01/15/23 0759 11/13/22 1033 10/03/22 1014 07/10/22 1039 05/25/22 0837  SODIUM mmol/L 131* 135 138  --  137  --   --  137  --  136  --    < >  --   POTASSIUM mmol/L 4.0 4.3 4.5  --  4.3  --   --  4.4  --  4.4  --    < >  --   CHLORIDE  mmol/L 93* 98 103  --  105  --   --  101  --  102  --    < >  --   CO2 mmol/L 26 27 28   --  22  --   --  26  --  25  --    < >  --   CALCIUM  mg/dL 9.4 9.0 9.1  --  9.4  --   --  9.0  --  9.8  --    < >  --  PHOSPHORUS mg/dL 3.7 3.8 3.6  --  4.0  --   --  2.9  --  3.2  --    < >  --   PTH pg/mL 103* 115* 114*  --  36  --   --  46  --  78*  --    < >  --   GLUCOSE mg/dL 841* 864* 843*  --  822*  --   --  129*  --  314*  --    < >  --   ALBUMIN  g/dL 4.1 4.0 4.1  --  4.1  --   --  4.1  --  4.1  --    < >  --   BUN mg/dL 41* 34* 27*  --  25  --   --  22  --  27*  --    < >  --   CREATININE mg/dL 7.34* 7.58* 8.05*  --  1.59*  --   --  1.76*  --  1.61*  --    < >  --   HEMOGLOBIN A1C %  --   --   --  7.6*  --  8.8* 7.1*  --  8.3*  --  8.1*  --  9.9*   < > = values in this interval not displayed.        No lab exists for component: IRON SATURATION, TRANSSATPER  CBC  Lab Units 02/25/24 1151 02/19/24 0849 11/19/23 1016 07/23/23 0913 03/22/23 0923 11/13/22 1033  WBC AUTO Thousand/uL 5.5 4.3 4.8 5.3 5.4 4.8  HEMOGLOBIN g/dL 88.2* 88.2* 87.6* 86.9* 13.5 13.1*  HEMATOCRIT % 37.9* 38.1* 38.2* 40.1 39.1 39.0  MCV fL 81.9 84.5 91.0 90.1 93.5 97.5  PLATELETS AUTO Thousand/uL 177 176 140 163 160 142    Urine  Lab Units 02/25/24 1151 02/19/24 0849 11/19/23 1016 07/23/23 0913 03/22/23 0923 11/13/22 1033 07/10/22 1039  PROT/CREAT RATIO UR mg/g creat  --   --   --  0.169*  169*  --  NOTE  NOTE  --   ALB MG/G CREAT UR mg/g creat 20 18 31*  --  6  --  8    Lab Results  Component Value Date   PTH 103 (H) 02/25/2024   CALCIUM  9.4 02/25/2024   PHOS 3.7 02/25/2024     Imaging and Other Studies  02/08/2022: Negative SPEP/UPEP.  Positive ANA with positive double-stranded DNA titer of 18. 02/23/2022: Right kidney 11.3 cm, left kidney 12.1 cm, enlarged prostate with bladder wall thickening noted.   No orders of the defined types were placed in this encounter.       Impression/Recommendations  Jermaine Kramer is a 73 y.o. male with past medical history of atrial fibrillation, hyperlipidemia, diabetes mellitus type 2, chronic systolic heart failure ejection fraction 30 to 35%, obstructive sleep apnea, hypertension, history of CVA, GERD, coronary disease status post CABG who was referred for the evaluation of chronic kidney disease stage IIIb.     1.  Diabetes mellitus type 2 with chronic kidney disease/chronic kidney disease stage IV.  Patient continues to have advanced renal dysfunction.  Most recent eGFR is 24.  He has signs of volume overload.  Already received Lasix  on Monday without much effect.  We had a long conversation about this today and patient to be admitted at [facility] and we recommend reinitiation of Lasix  drip at 8 mg/h.  Hold Bumex  while on Lasix  drip.  Monitor serum electrolytes closely.  2.  Hypertension.  Blood pressure 103/79 today.  Okay to maintain the patient on metoprolol  as well as midodrine .  3.  Secondary hyperparathyroidism.  Most recent PTH was 103, phosphorus 3.7, calcium  9.4.  Maintain the patient on current dosage of calcitriol .  4.  Anemia of chronic kidney disease.  Hemoglobin 11.7 last check.  No immediate need for Epogen.  Continue to monitor CBC.  5.  Lower extremity edema/chronic systolic heart failure.  Weight back up to 193 pounds.  Increasing lower extremity edema as well as shortness of breath.  Rales heard on exam.  As above admit patient to [facility] and start the patient on furosemide  drip at 8 mg/h.  Case discussed with Dr. Dicky of the ED.   6.  Complex visit today requiring significant record review and coordination of care including admission to the hospital.   Return in about 2 weeks (around 04/02/2024).   Munsoor Lateef, MD

## 2024-03-19 NOTE — ED Notes (Signed)
 Report off to les paramedic.  Pt moved to cpod 37

## 2024-03-20 ENCOUNTER — Telehealth (HOSPITAL_COMMUNITY): Payer: Self-pay

## 2024-03-20 ENCOUNTER — Telehealth: Payer: Self-pay

## 2024-03-20 ENCOUNTER — Other Ambulatory Visit (HOSPITAL_COMMUNITY): Payer: Self-pay

## 2024-03-20 DIAGNOSIS — I5043 Acute on chronic combined systolic (congestive) and diastolic (congestive) heart failure: Secondary | ICD-10-CM | POA: Diagnosis not present

## 2024-03-20 DIAGNOSIS — N1832 Chronic kidney disease, stage 3b: Secondary | ICD-10-CM | POA: Insufficient documentation

## 2024-03-20 LAB — CBC
HCT: 32.9 % — ABNORMAL LOW (ref 39.0–52.0)
Hemoglobin: 11 g/dL — ABNORMAL LOW (ref 13.0–17.0)
MCH: 24.9 pg — ABNORMAL LOW (ref 26.0–34.0)
MCHC: 33.4 g/dL (ref 30.0–36.0)
MCV: 74.4 fL — ABNORMAL LOW (ref 80.0–100.0)
Platelets: 166 K/uL (ref 150–400)
RBC: 4.42 MIL/uL (ref 4.22–5.81)
RDW: 15.8 % — ABNORMAL HIGH (ref 11.5–15.5)
WBC: 5.6 K/uL (ref 4.0–10.5)
nRBC: 0 % (ref 0.0–0.2)

## 2024-03-20 LAB — CUP PACEART REMOTE DEVICE CHECK
Battery Remaining Longevity: 87 mo
Battery Voltage: 3 V
Brady Statistic AP VP Percent: 2.88 %
Brady Statistic AP VS Percent: 15.51 %
Brady Statistic AS VP Percent: 0.22 %
Brady Statistic AS VS Percent: 81.39 %
Brady Statistic RA Percent Paced: 17.73 %
Brady Statistic RV Percent Paced: 2.94 %
Date Time Interrogation Session: 20251119044224
HighPow Impedance: 59 Ohm
Implantable Lead Connection Status: 753985
Implantable Lead Connection Status: 753985
Implantable Lead Implant Date: 20230222
Implantable Lead Implant Date: 20230222
Implantable Lead Location: 753859
Implantable Lead Location: 753860
Implantable Lead Model: 5076
Implantable Pulse Generator Implant Date: 20230222
Lead Channel Impedance Value: 399 Ohm
Lead Channel Impedance Value: 456 Ohm
Lead Channel Impedance Value: 475 Ohm
Lead Channel Pacing Threshold Amplitude: 0.5 V
Lead Channel Pacing Threshold Amplitude: 0.75 V
Lead Channel Pacing Threshold Pulse Width: 0.4 ms
Lead Channel Pacing Threshold Pulse Width: 0.4 ms
Lead Channel Sensing Intrinsic Amplitude: 1.25 mV
Lead Channel Sensing Intrinsic Amplitude: 1.25 mV
Lead Channel Sensing Intrinsic Amplitude: 9.125 mV
Lead Channel Sensing Intrinsic Amplitude: 9.125 mV
Lead Channel Setting Pacing Amplitude: 1.5 V
Lead Channel Setting Pacing Amplitude: 1.5 V
Lead Channel Setting Pacing Pulse Width: 0.4 ms
Lead Channel Setting Sensing Sensitivity: 0.3 mV
Zone Setting Status: 755011
Zone Setting Status: 755011

## 2024-03-20 LAB — GLUCOSE, CAPILLARY
Glucose-Capillary: 159 mg/dL — ABNORMAL HIGH (ref 70–99)
Glucose-Capillary: 254 mg/dL — ABNORMAL HIGH (ref 70–99)
Glucose-Capillary: 181 mg/dL — ABNORMAL HIGH (ref 70–99)
Glucose-Capillary: 153 mg/dL — ABNORMAL HIGH (ref 70–99)

## 2024-03-20 LAB — RENAL FUNCTION PANEL
Albumin: 3.8 g/dL (ref 3.5–5.0)
Albumin: 3.9 g/dL (ref 3.5–5.0)
Albumin: 3.8 g/dL (ref 3.5–5.0)
Anion gap: 10 (ref 5–15)
Anion gap: 11 (ref 5–15)
Anion gap: 13 (ref 5–15)
BUN: 51 mg/dL — ABNORMAL HIGH (ref 8–23)
BUN: 51 mg/dL — ABNORMAL HIGH (ref 8–23)
BUN: 48 mg/dL — ABNORMAL HIGH (ref 8–23)
CO2: 28 mmol/L (ref 22–32)
CO2: 27 mmol/L (ref 22–32)
CO2: 25 mmol/L (ref 22–32)
Calcium: 8.8 mg/dL — ABNORMAL LOW (ref 8.9–10.3)
Calcium: 8.8 mg/dL — ABNORMAL LOW (ref 8.9–10.3)
Calcium: 8.7 mg/dL — ABNORMAL LOW (ref 8.9–10.3)
Chloride: 87 mmol/L — ABNORMAL LOW (ref 98–111)
Chloride: 85 mmol/L — ABNORMAL LOW (ref 98–111)
Chloride: 85 mmol/L — ABNORMAL LOW (ref 98–111)
Creatinine, Ser: 2.71 mg/dL — ABNORMAL HIGH (ref 0.61–1.24)
Creatinine, Ser: 2.54 mg/dL — ABNORMAL HIGH (ref 0.61–1.24)
Creatinine, Ser: 2.58 mg/dL — ABNORMAL HIGH (ref 0.61–1.24)
GFR, Estimated: 24 mL/min — ABNORMAL LOW (ref >60)
GFR, Estimated: 26 mL/min — ABNORMAL LOW (ref >60)
GFR, Estimated: 25 mL/min — ABNORMAL LOW (ref >60)
Glucose, Bld: 142 mg/dL — ABNORMAL HIGH (ref 70–99)
Glucose, Bld: 231 mg/dL — ABNORMAL HIGH (ref 70–99)
Glucose, Bld: 194 mg/dL — ABNORMAL HIGH (ref 70–99)
Phosphorus: 3.7 mg/dL (ref 2.5–4.6)
Phosphorus: 3.9 mg/dL (ref 2.5–4.6)
Phosphorus: 4.2 mg/dL (ref 2.5–4.6)
Potassium: 4.5 mmol/L (ref 3.5–5.1)
Potassium: 4 mmol/L (ref 3.5–5.1)
Potassium: 4.2 mmol/L (ref 3.5–5.1)
Sodium: 124 mmol/L — ABNORMAL LOW (ref 135–145)
Sodium: 123 mmol/L — ABNORMAL LOW (ref 135–145)
Sodium: 122 mmol/L — ABNORMAL LOW (ref 135–145)

## 2024-03-20 LAB — BASIC METABOLIC PANEL WITH GFR
Anion gap: 11 (ref 5–15)
BUN: 50 mg/dL — ABNORMAL HIGH (ref 8–23)
CO2: 25 mmol/L (ref 22–32)
Calcium: 8.8 mg/dL — ABNORMAL LOW (ref 8.9–10.3)
Chloride: 87 mmol/L — ABNORMAL LOW (ref 98–111)
Creatinine, Ser: 2.51 mg/dL — ABNORMAL HIGH (ref 0.61–1.24)
GFR, Estimated: 26 mL/min — ABNORMAL LOW (ref >60)
Glucose, Bld: 129 mg/dL — ABNORMAL HIGH (ref 70–99)
Potassium: 4.2 mmol/L (ref 3.5–5.1)
Sodium: 123 mmol/L — ABNORMAL LOW (ref 135–145)

## 2024-03-20 LAB — ALBUMIN: Albumin: 3.8 g/dL (ref 3.5–5.0)

## 2024-03-20 LAB — PHOSPHORUS: Phosphorus: 3.8 mg/dL (ref 2.5–4.6)

## 2024-03-20 MED ORDER — FUROSEMIDE 10 MG/ML IJ SOLN
8.0000 mg/h | INTRAVENOUS | Status: DC
  Administered 2024-03-20: 4 mg/h via INTRAVENOUS
  Administered 2024-03-21 – 2024-03-23 (×3): 8 mg/h via INTRAVENOUS
  Filled 2024-03-20 (×5): qty 20

## 2024-03-20 MED ORDER — TRAZODONE HCL 50 MG PO TABS
50.0000 mg | ORAL_TABLET | Freq: Every day | ORAL | Status: AC
  Administered 2024-03-20: 50 mg via ORAL
  Filled 2024-03-20: qty 1

## 2024-03-20 MED ORDER — INSULIN ASPART 100 UNIT/ML IJ SOLN
0.0000 [IU] | Freq: Three times a day (TID) | INTRAMUSCULAR | Status: DC
  Administered 2024-03-20 – 2024-03-21 (×2): 2 [IU] via SUBCUTANEOUS
  Administered 2024-03-21: 3 [IU] via SUBCUTANEOUS
  Administered 2024-03-21: 1 [IU] via SUBCUTANEOUS
  Administered 2024-03-22: 3 [IU] via SUBCUTANEOUS
  Administered 2024-03-22: 2 [IU] via SUBCUTANEOUS
  Administered 2024-03-22: 1 [IU] via SUBCUTANEOUS
  Administered 2024-03-23: 2 [IU] via SUBCUTANEOUS
  Administered 2024-03-23: 3 [IU] via SUBCUTANEOUS
  Administered 2024-03-24: 1 [IU] via SUBCUTANEOUS
  Administered 2024-03-24: 3 [IU] via SUBCUTANEOUS
  Filled 2024-03-20: qty 2
  Filled 2024-03-20: qty 3
  Filled 2024-03-20 (×2): qty 5
  Filled 2024-03-20 (×2): qty 2
  Filled 2024-03-20: qty 3
  Filled 2024-03-20: qty 5
  Filled 2024-03-20 (×3): qty 1

## 2024-03-20 MED ORDER — METOLAZONE 2.5 MG PO TABS
2.5000 mg | ORAL_TABLET | ORAL | Status: DC
  Administered 2024-03-21 – 2024-03-24 (×2): 2.5 mg via ORAL
  Filled 2024-03-20 (×2): qty 1

## 2024-03-20 MED ORDER — INSULIN ASPART 100 UNIT/ML IJ SOLN: 0.0000 [IU] | Freq: Every day | INTRAMUSCULAR | Status: DC

## 2024-03-20 MED ORDER — METHOCARBAMOL 500 MG PO TABS
500.0000 mg | ORAL_TABLET | Freq: Four times a day (QID) | ORAL | Status: DC | PRN
  Administered 2024-03-20 – 2024-03-21 (×3): 500 mg via ORAL
  Filled 2024-03-20 (×3): qty 1

## 2024-03-20 NOTE — Telephone Encounter (Signed)
 Alert received:  ICD: Scheduled remote reviewed. Normal device function.  Presenting rhythm: AS/VS PAF, some episodes FFRWS, Eliquis  per EPIC EMR, AF burden 0.5%, longest 10 min 56 sec 12 NSVT, V>A, V- rates 200-250 bpm  2 VT in monitor only, > 20 beats, 201 bpm (03/01/2024 #42) 171 bpm (02/03/2024 #36) sent to triage Abnormal HF diagnostics this monitoring period  Pt currently hospitalized.  Documenting for inpatient cardiology.  There appears to be some type of device dysfunction.  Recommend having a [employer] representative evaluate Pt will in hospital.

## 2024-03-20 NOTE — Progress Notes (Signed)
 PROGRESS NOTE    ALVAR MALINOSKI  FMW:[id number] DOB: 1950-07-15 DOA: 03/19/2024 PCP: Perri Constance Sor, PA-C  Outpatient Specialists: cardiology, nephrology    Brief Narrative:   From admission h and p  BOND GRIESHOP is a 73 y.o. year old male with medical history of hypertension, hyperlipidemia c/b by CAD status post CABG, type 2 diabetes, HFrEF (EF 25% in 2024) status post ICD with pacemaker, atrial fibrillation presenting to the ED from his nephrology office for anasarca and complaints of weight gain and dyspnea.     Pt states he states he has been retaining fluid over the last few months. Pt reports urinating less and less over this time period. He has tried outpatient IV lasix  device at home about 7 weeks ago. He is taking his medications very regularly. Baseline weight is 180 lb. He is having some coughing with some sputum production. Denies any fevers, or chills.      On arrival to the ED patient was noted to be HDS stable.  Lab work and imaging obtained.  CBC with no leukocytosis, mild anemia at baseline.  BMP with hyponatremia at 124 with hypochloremia, AKI on CKD with creatinine at 2.8 with baseline around 2.  proBNP elevated at 19.4k.  Chest x-ray with trace bilateral pleural effusions, right upper quadrant obtained by EDP given concern for ascites and pending.  Given need for continued care, [facility] contacted for admission.  Assessment & Plan:   Principal Problem:   Acute exacerbation of CHF (congestive heart failure) (HCC) Active Problems:   GERD (gastroesophageal reflux disease)   Hypertension   HFrEF (heart failure with reduced ejection fraction) (HCC)   CAD s/p CABG x 3   Chronic combined systolic and diastolic congestive heart failure (HCC)   Atrial fibrillation, chronic (HCC)   Obesity (BMI 30.0-34.9)   BPH (benign prostatic hyperplasia)   DM2 (diabetes mellitus, type 2) (HCC)   CKD stage 3b, GFR 30-44 ml/min (HCC)  # HFrEF with acute exacerbation # ICD  status 3rd hospitalization in as many months, significant edema mainly in abdomen, small pleural effusions as well. EF 25% on recent TTE. - continue lasix  gtt - cards ok with continuing bb for now - strict I/Os - palliative consult, we engaged in 10 min of advanced care planning today, full scope for now  # Hyponatremia Likely 2/2 chf exacerbation - diurese as above  # AKI on ckd 3b Baseline cr of around 2 here 2.5 - monitor while diuresing - nephrology consulted  # CAD s/p CABG x3 No chest pain - home apixaban   # A-fib Rate controlled - cont metop - cont home apixaban   # Hypotension, chronic - home midodrine   # BPH - home flomax   # T2DM Euglycemic - SSI - hold metformin  given worsening kidney function   DVT prophylaxis: apixaban  Code Status: full Family Communication: wife at bedside  Level of care: Progressive Status is: Inpatient Remains inpatient appropriate because: severity of illness    Consultants:  cardiology  Procedures: none  Antimicrobials:  none    Subjective: Reports feeling stable, no chest pain or dyspnea at rest  Objective: Vitals:   03/19/24 2102 03/20/24 0050 03/20/24 0503 03/20/24 0810  BP: 102/80 (!) 86/68 101/68 107/74  Pulse: 84 74 74 77  Resp: 18 16 18 18   Temp: 98.3 F (36.8 C) 98.3 F (36.8 C) 98.1 F (36.7 C) 97.6 F (36.4 C)  TempSrc:    Oral  SpO2: 97% 94% 95% 97%  Weight:  90.6 kg   Height:        Intake/Output Summary (Last 24 hours) at 03/20/2024 0943 Last data filed at 03/20/2024 0732 Gross per 24 hour  Intake 137.24 ml  Output 1150 ml  Net -1012.76 ml   Filed Weights   03/19/24 1035 03/20/24 0503  Weight: 88 kg 90.6 kg    Examination:  General exam: Appears calm and comfortable  Respiratory system: Clear to auscultation save for rales at bases Cardiovascular system: distant heart sounds Gastrointestinal system: Abdomen is severely distended, mildly tender Central nervous system: Alert and  oriented. No focal neurological deficits. Extremities: Symmetric 5 x 5 power. Mild lower extremity edema Skin: No rashes, lesions or ulcers Psychiatry: Judgement and insight appear normal. Mood & affect appropriate.     Data Reviewed: I have personally reviewed following labs and imaging studies  CBC: Recent Labs  Lab 03/19/24 1038 03/20/24 0803  WBC 6.0 5.6  HGB 11.2* 11.0*  HCT 34.6* 32.9*  MCV 76.9* 74.4*  PLT 177 166   Basic Metabolic Panel: Recent Labs  Lab 03/19/24 1038 03/19/24 1918 03/20/24 0054 03/20/24 0803  NA 124* 123* 124* 123*  K 4.4 4.1 4.5 4.2  CL 87* 85* 87* 87*  CO2 26 24 28 25   GLUCOSE 159* 203* 142* 129*  BUN 53* 49* 51* 50*  CREATININE 2.85* 2.63* 2.71* 2.51*  CALCIUM  8.8* 8.7* 8.8* 8.8*  MG 1.7  --   --   --   PHOS 3.8 3.6 3.7 3.8   GFR: Estimated Creatinine Clearance: 26.3 mL/min (A) (by C-G formula based on SCr of 2.51 mg/dL (H)). Liver Function Tests: Recent Labs  Lab 03/19/24 1038 03/19/24 1918 03/20/24 0054 03/20/24 0803  AST 17  --   --   --   ALT 8  --   --   --   ALKPHOS 76  --   --   --   BILITOT 0.8  --   --   --   PROT 6.2*  --   --   --   ALBUMIN 3.9 3.7 3.8 3.8   No results for input(s): LIPASE, AMYLASE in the last 168 hours. No results for input(s): AMMONIA in the last 168 hours. Coagulation Profile: No results for input(s): INR, PROTIME in the last 168 hours. Cardiac Enzymes: No results for input(s): CKTOTAL, CKMB, CKMBINDEX, TROPONINI in the last 168 hours. BNP (last 3 results) Recent Labs    03/19/24 1038  PROBNP 19,467.0*   HbA1C: No results for input(s): HGBA1C in the last 72 hours. CBG: Recent Labs  Lab 03/20/24 0808  GLUCAP 159*   Lipid Profile: No results for input(s): CHOL, HDL, LDLCALC, TRIG, CHOLHDL, LDLDIRECT in the last 72 hours. Thyroid Function Tests: No results for input(s): TSH, T4TOTAL, FREET4, T3FREE, THYROIDAB in the last 72 hours. Anemia  Panel: No results for input(s): VITAMINB12, FOLATE, FERRITIN, TIBC, IRON, RETICCTPCT in the last 72 hours. Urine analysis:    Component Value Date/Time   COLORURINE YELLOW (A) 04/05/2023 1925   APPEARANCEUR CLEAR (A) 04/05/2023 1925   APPEARANCEUR CLEAR 01/15/2013 1659   LABSPEC 1.011 04/05/2023 1925   LABSPEC 1.005 01/15/2013 1659   PHURINE 5.0 04/05/2023 1925   GLUCOSEU NEGATIVE 04/05/2023 1925   GLUCOSEU NEGATIVE 01/15/2013 1659   HGBUR SMALL (A) 04/05/2023 1925   BILIRUBINUR NEGATIVE 04/05/2023 1925   BILIRUBINUR NEGATIVE 01/15/2013 1659   KETONESUR NEGATIVE 04/05/2023 1925   PROTEINUR NEGATIVE 04/05/2023 1925   NITRITE NEGATIVE 04/05/2023 1925   LEUKOCYTESUR TRACE (A)  04/05/2023 1925   LEUKOCYTESUR 1+ 01/15/2013 1659   Sepsis Labs: @LABRCNTIP (procalcitonin:4,lacticidven:4)  )No results found for this or any previous visit (from the past 240 hours).       Radiology Studies: CUP PACEART REMOTE DEVICE CHECK Result Date: 03/20/2024 ICD: Scheduled remote reviewed. Normal device function.  Presenting rhythm: AS/VS PAF, some episodes FFRWS, Eliquis  per EPIC EMR, AF burden 0.5%, longest 10 min 56 sec 12 NSVT, V>A, V- rates 200-250 bpm  2 VT in monitor only, > 20 beats, 201 bpm (03/01/2024 #42) 171 bpm (02/03/2024 #36) Abnormal HF diagnostics this monitoring period Next remote transmission per protocol.  ML, CVRS  US  ABDOMEN LIMITED RUQ (LIVER/GB) Result Date: 03/19/2024 EXAM: Right Upper Quadrant Abdominal Ultrasound 03/19/2024 02:54:40 PM TECHNIQUE: Real-time ultrasonography of the right upper quadrant of the abdomen was performed. COMPARISON: US  Abdomen 01/19/2024. CLINICAL HISTORY: Ascites, cholecystectomy. FINDINGS: LIVER: The liver demonstrates normal echogenicity. No intrahepatic biliary ductal dilatation. No evidence of mass. Mild ascites is noted in the right upper quadrant around the liver. BILIARY SYSTEM: Status post cholecystectomy. Common bile duct is within  normal limits measuring 3.1 mm. IMPRESSION: 1. Mild ascites in the right upper quadrant around the liver. 2. Status post cholecystectomy. Electronically signed by: Lynwood Seip MD 03/19/2024 03:24 PM EST RP Workstation: [id number]   DG Chest 2 View Result Date: 03/19/2024 CLINICAL DATA:  Shortness of breath. EXAM: DG CHEST 2V COMPARISON:  Chest [place]  dated 02/11/2024. FINDINGS: Small right pleural effusion and right lung base atelectasis. Trace left pleural effusion suspected pneumonia is not excluded. No pneumothorax. Stable cardiomegaly. Bilateral pectoral pacemaker devices. Median sternotomy wires. No acute osseous pathology. IMPRESSION: Small right and trace left pleural effusion and right lung base atelectasis or infiltrate, relatively similar to prior radiograph. Electronically Signed   By: Vanetta Chou M.D.   On: 03/19/2024 11:39        Scheduled Meds:  apixaban   5 mg Oral BID   atorvastatin   80 mg Oral Daily   calcitRIOL   0.25 mcg Oral Daily   metoprolol  succinate  12.5 mg Oral QPM   midodrine   5 mg Oral TID WC   pantoprazole   40 mg Oral Daily   sodium chloride  flush  3 mL Intravenous Q12H   tamsulosin   0.4 mg Oral QPC supper   Continuous Infusions:  furosemide  (LASIX ) 200 mg in dextrose  5 % 100 mL (2 mg/mL) infusion       LOS: 1 day     Devaughn KATHEE Ban, MD [facility]   If 7PM-7AM, please contact night-coverage [email or web address] Password [email or web address] 03/20/2024, 9:43 AM

## 2024-03-20 NOTE — Progress Notes (Signed)
 [facility] CLINIC CARDIOLOGY PROGRESS NOTE       Patient ID: Jermaine Kramer MRN: [id number] DOB/AGE: [birth year] 73 y.o.  Admit date: 03/19/2024 Referring Physician Dr. Morene Bathe Primary Physician Perri, Mychal Burnard, PA-C  Primary Cardiologist Dr. Ammon Reason for Consultation AF CHF  HPI: Jermaine Kramer is a 73 y.o. male  with a past medical history of  CAD (s/p recent CABG with LIMA to LAD, SVG to ramus and SVG to PDA on 02/16/2021 with Dr. Leanor), postoperative atrial fibrillation (on amiodarone  and Eliquis , dilated cardiomyopathy with LVEF 20-25%, mod MR, 07/22/2021 s/p dual chamber ICD 06/22/21, history of frequent PVCs, type 2 diabetes, history of TIA, history of penile cancer, hypertension  who presented to the ED on 03/19/2024 for swelling. Cardiology was consulted for further evaluation.   Interval history: -Patient seen and examined this AM, resting in bed with family at bedside.  -Reports good UOP with lasix  gtt. Abd still distended. Denies SOB.  -LE mildly improved.   Review of systems complete and found to be negative unless listed above    Past Medical History:  Diagnosis Date   Arrhythmia    atrial fibrillation   CHF (congestive heart failure) (HCC)    Coronary artery disease    Diabetes mellitus without complication (HCC)    GERD (gastroesophageal reflux disease)    Hypercholesteremia    Hypertension    Sleep apnea    Stroke [facility])    2015 or longer ago    Past Surgical History:  Procedure Laterality Date   CATARACT EXTRACTION Bilateral 04/06/2022   CCM GENERATOR AND A/V LEAD INSERTION N/A 09/04/2022   Procedure: CCM GENERATOR AND A/V LEAD INSERTION;  Surgeon: Cindie Ole DASEN, MD;  Location: [facility] INVASIVE CV LAB;  Service: Cardiovascular;  Laterality: N/A;   CHOLECYSTECTOMY     COLONOSCOPY WITH PROPOFOL  N/A 04/14/2019   Procedure: COLONOSCOPY WITH PROPOFOL ;  Surgeon: Toledo, Ladell POUR, MD;  Location: [facility] ENDOSCOPY;  Service: Gastroenterology;  Laterality:  N/A;   CORONARY ARTERY BYPASS GRAFT  02/16/2021   ICD IMPLANT N/A 06/22/2021   Procedure: ICD IMPLANT;  Surgeon: Cindie Ole DASEN, MD;  Location: [facility] INVASIVE CV LAB;  Service: Cardiovascular;  Laterality: N/A;   KNEE ARTHROSCOPY     RENAL CYST EXCISION     RIGHT HEART CATH Right 02/27/2024   Procedure: RIGHT HEART CATH;  Surgeon: Ammon Blunt, MD;  Location: [facility] INVASIVE CV LAB;  Service: Cardiovascular;  Laterality: Right;    Medications Prior to Admission  Medication Sig Dispense Refill Last Dose/Taking   albuterol  (VENTOLIN  HFA) 108 (90 Base) MCG/ACT inhaler Inhale 2 puffs into the lungs every 6 (six) hours as needed for wheezing or shortness of breath. 8.5 g 2 Unknown   apixaban  (ELIQUIS ) 5 MG TABS tablet Take 1 tablet (5 mg total) by mouth 2 (two) times daily. 180 tablet 3 03/19/2024   atorvastatin  (LIPITOR ) 80 MG tablet Take 1 tablet (80 mg total) by mouth daily. 30 tablet 5 03/19/2024   bumetanide (BUMEX) 2 MG tablet Take 1 tablet (2 mg total) by mouth daily. 30 tablet 5 03/19/2024   calcitRIOL (ROCALTROL) 0.25 MCG capsule Take 0.25 mcg by mouth daily.   03/19/2024   metFORMIN  (GLUCOPHAGE ) 500 MG tablet Take 500 mg by mouth 2 (two) times daily.   03/19/2024   metolazone  (ZAROXOLYN ) 2.5 MG tablet Take 2.5 mg by mouth once a week. Takes on Fridays   03/14/2024   metoprolol  succinate (TOPROL -XL) 25 MG 24 hr tablet TAKE 1/2 TABLET  BY MOUTH EVERY EVENING 45 tablet 3 03/18/2024   midodrine  (PROAMATINE ) 5 MG tablet Take 1 tablet (5 mg total) by mouth 2 (two) times daily with a meal. 60 tablet 3 03/19/2024   omeprazole (PRILOSEC) 40 MG capsule Take 40 mg by mouth 2 (two) times daily.   03/19/2024   ondansetron  (ZOFRAN -ODT) 4 MG disintegrating tablet Take 1 tablet (4 mg total) by mouth every 8 (eight) hours as needed for nausea or vomiting. 12 tablet 0 Unknown   spironolactone  (ALDACTONE ) 25 MG tablet Take 0.5 tablets (12.5 mg total) by mouth daily.   03/19/2024   tamsulosin   (FLOMAX ) 0.4 MG CAPS capsule Take 0.4 mg by mouth in the morning and at bedtime.   03/19/2024   Continuous Glucose Sensor (FREESTYLE LIBRE 2 SENSOR) MISC by Does not apply route.      Insulin  Pen Needle (COMFORT EZ PEN NEEDLES) 32G X 8 MM MISC by Other route.      Social History   Socioeconomic History   Marital status: Married    Spouse name: Not on file   Number of children: Not on file   Years of education: Not on file   Highest education level: Not on file  Occupational History   Not on file  Tobacco Use   Smoking status: Never   Smokeless tobacco: Never  Vaping Use   Vaping status: Never Used  Substance and Sexual Activity   Alcohol use: Not Currently   Drug use: No   Sexual activity: Not Currently  Other Topics Concern   Not on file  Social History Narrative   Not on file   Social Drivers of Health   Financial Resource Strain: Low Risk  (03/12/2024)   Received from [facility]   Overall Financial Resource Strain (CARDIA)    Difficulty of Paying Living Expenses: Not hard at all  Food Insecurity: No Food Insecurity (03/20/2024)   Hunger Vital Sign    Worried About Running Out of Food in the Last Year: Never true    Ran Out of Food in the Last Year: Never true  Transportation Needs: No Transportation Needs (03/20/2024)   PRAPARE - Administrator, Civil Service (Medical): No    Lack of Transportation (Non-Medical): No  Physical Activity: Not on file  Stress: Not on file  Social Connections: Moderately Integrated (03/20/2024)   Social Connection and Isolation Panel    Frequency of Communication with Friends and Family: More than three times a week    Frequency of Social Gatherings with Friends and Family: More than three times a week    Attends Religious Services: More than 4 times per year    Active Member of [employer] or Organizations: No    Attends Banker Meetings: Never    Marital Status: Married  Catering Manager  Violence: Unknown (03/20/2024)   Humiliation, Afraid, Rape, and Kick questionnaire    Fear of Current or Ex-Partner: No    Emotionally Abused: No    Physically Abused: Not on file    Sexually Abused: No    Family History  Problem Relation Age of Onset   Parkinson's disease Mother    Lupus Mother    Heart disease Father      Vitals:   03/19/24 2102 03/20/24 0050 03/20/24 0503 03/20/24 0810  BP: 102/80 (!) 86/68 101/68 107/74  Pulse: 84 74 74 77  Resp: 18 16 18 18   Temp: 98.3 F (36.8 C) 98.3 F (36.8 C) 98.1 F (  36.7 C) 97.6 F (36.4 C)  TempSrc:    Oral  SpO2: 97% 94% 95% 97%  Weight:   90.6 kg   Height:        PHYSICAL EXAM General: Chronically ill-appearing male, well nourished, in no acute distress. HEENT: Normocephalic and atraumatic. Neck: No JVD.  Lungs: Normal respiratory effort on room air. Clear bilaterally to auscultation. No wheezes, crackles, rhonchi.  Heart: HRRR. Normal S1 and S2 without gallops or murmurs.  Abdomen: Distended. Msk: Normal strength and tone for age. Extremities: Warm and well perfused. No clubbing, cyanosis.  2+ pitting edema.  Neuro: Alert and oriented X 3. Psych: Answers questions appropriately.   Labs: Basic Metabolic Panel: Recent Labs    03/19/24 1038 03/19/24 1918 03/20/24 0054 03/20/24 0803  NA 124*   < > 124* 123*  K 4.4   < > 4.5 4.2  CL 87*   < > 87* 87*  CO2 26   < > 28 25  GLUCOSE 159*   < > 142* 129*  BUN 53*   < > 51* 50*  CREATININE 2.85*   < > 2.71* 2.51*  CALCIUM  8.8*   < > 8.8* 8.8*  MG 1.7  --   --   --   PHOS 3.8   < > 3.7 3.8   < > = values in this interval not displayed.   Liver Function Tests: Recent Labs    03/19/24 1038 03/19/24 1918 03/20/24 0054 03/20/24 0803  AST 17  --   --   --   ALT 8  --   --   --   ALKPHOS 76  --   --   --   BILITOT 0.8  --   --   --   PROT 6.2*  --   --   --   ALBUMIN 3.9   < > 3.8 3.8   < > = values in this interval not displayed.   No results for input(s):  LIPASE, AMYLASE in the last 72 hours. CBC: Recent Labs    03/19/24 1038 03/20/24 0803  WBC 6.0 5.6  HGB 11.2* 11.0*  HCT 34.6* 32.9*  MCV 76.9* 74.4*  PLT 177 166   Cardiac Enzymes: No results for input(s): CKTOTAL, CKMB, CKMBINDEX, TROPONINIHS in the last 72 hours. BNP: No results for input(s): BNP in the last 72 hours. D-Dimer: No results for input(s): DDIMER in the last 72 hours. Hemoglobin A1C: No results for input(s): HGBA1C in the last 72 hours. Fasting Lipid Panel: No results for input(s): CHOL, HDL, LDLCALC, TRIG, CHOLHDL, LDLDIRECT in the last 72 hours. Thyroid Function Tests: No results for input(s): TSH, T4TOTAL, T3FREE, THYROIDAB in the last 72 hours.  Invalid input(s): FREET3 Anemia Panel: No results for input(s): VITAMINB12, FOLATE, FERRITIN, TIBC, IRON, RETICCTPCT in the last 72 hours.   Radiology: CUP PACEART REMOTE DEVICE CHECK Result Date: 03/20/2024 ICD: Scheduled remote reviewed. Normal device function.  Presenting rhythm: AS/VS PAF, some episodes FFRWS, Eliquis  per EPIC EMR, AF burden 0.5%, longest 10 min 56 sec 12 NSVT, V>A, V- rates 200-250 bpm  2 VT in monitor only, > 20 beats, 201 bpm (03/01/2024 #42) 171 bpm (02/03/2024 #36) Abnormal HF diagnostics this monitoring period Next remote transmission per protocol.  ML, CVRS  US  ABDOMEN LIMITED RUQ (LIVER/GB) Result Date: 03/19/2024 EXAM: Right Upper Quadrant Abdominal Ultrasound 03/19/2024 02:54:40 PM TECHNIQUE: Real-time ultrasonography of the right upper quadrant of the abdomen was performed. COMPARISON: US  Abdomen 01/19/2024. CLINICAL HISTORY: Ascites, cholecystectomy. FINDINGS:  LIVER: The liver demonstrates normal echogenicity. No intrahepatic biliary ductal dilatation. No evidence of mass. Mild ascites is noted in the right upper quadrant around the liver. BILIARY SYSTEM: Status post cholecystectomy. Common bile duct is within normal limits measuring 3.1  mm. IMPRESSION: 1. Mild ascites in the right upper quadrant around the liver. 2. Status post cholecystectomy. Electronically signed by: Lynwood Seip MD 03/19/2024 03:24 PM EST RP Workstation: [id number]   DG Chest 2 View Result Date: 03/19/2024 CLINICAL DATA:  Shortness of breath. EXAM: DG CHEST 2V COMPARISON:  Chest [place]  dated 02/11/2024. FINDINGS: Small right pleural effusion and right lung base atelectasis. Trace left pleural effusion suspected pneumonia is not excluded. No pneumothorax. Stable cardiomegaly. Bilateral pectoral pacemaker devices. Median sternotomy wires. No acute osseous pathology. IMPRESSION: Small right and trace left pleural effusion and right lung base atelectasis or infiltrate, relatively similar to prior radiograph. Electronically Signed   By: Vanetta Chou M.D.   On: 03/19/2024 11:39   CARDIAC CATHETERIZATION Result Date: 02/27/2024   Hemodynamic findings consistent with moderate pulmonary hypertension.     RA   27/26,  22 mmHg           RV   49/6,    21 mmHg           PA   54/29,  37 mmHg           PW  29/35,  27 mmHg           CO 2.65           CI  1.39 2.  Moderate pulmonary hypertension    ECHO 03/2024: SEVERE LEFT VENTRICULAR SYSTOLIC DYSFUNCTION WITH NO LVH, LV SIZE IS MODERATELY ENLARGED  ESTIMATED EF: 25%, CALC EF(2D): 31%  NORMAL LA PRESSURES WITH NORMAL DIASTOLIC FUNCTION  MILD RIGHT VENTRICULAR SYSTOLIC DYSFUNCTION  VALVULAR REGURGITATION: No AR, MODERATE MR, TRIVIAL PR, MODERATE TR                       NO VALVULAR STENOSIS         TELEMETRY (personally reviewed): VP rate 80s  EKG (personally reviewed): normal sinus rhythm right bundle blanch block rate 83 bpm  Data reviewed by me 03/20/2024: last 24h vitals tele labs imaging I/O ED provider note, admission H&P, hospitalist progress note  Principal Problem:   Acute exacerbation of CHF (congestive heart failure) (HCC) Active Problems:   GERD (gastroesophageal reflux disease)   Hypertension    HFrEF (heart failure with reduced ejection fraction) (HCC)   CAD s/p CABG x 3   Chronic combined systolic and diastolic congestive heart failure (HCC)   Atrial fibrillation, chronic (HCC)   Obesity (BMI 30.0-34.9)   BPH (benign prostatic hyperplasia)   DM2 (diabetes mellitus, type 2) (HCC)   CKD stage 3b, GFR 30-44 ml/min (HCC)    ASSESSMENT AND PLAN:  Jermaine Kramer is a 73 y.o. male  with a past medical history of  CAD (s/p recent CABG with LIMA to LAD, SVG to ramus and SVG to PDA on 02/16/2021 with Dr. Leanor), postoperative atrial fibrillation (on amiodarone  and Eliquis , dilated cardiomyopathy with LVEF 20-25%, mod MR, 07/22/2021 s/p dual chamber ICD 06/22/21, history of frequent PVCs, type 2 diabetes, history of TIA, history of penile cancer, hypertension  who presented to the ED on 03/19/2024 for swelling. Cardiology was consulted for further evaluation.   # Acute on chronic HFrEF # Coronary artery disease s/p CABG 2022 # Ischemic cardiomyopathy s/p ICD # Chronic  kidney disease stage IIIb # Paroxysmal atrial fibrillation Presented with worsening LE edema and abdominal distention. BNP elevated at 19,000. Started on IV Lasix  infusion in the ED.  - Continue IV lasix  infusion, dosing per nephrology. Appreciate their recommendations.  - Can consider doses of metolazone  pending renal function. - Continue metoprolol  succinate 12.5 mg daily.  - Will defer resuming spironolactone  at this time.  Consider resuming during admission pending BP, renal function, volume status. - Continue Eliquis  5 mg bid.  - Continue atorvastatin  80 mg daily.    This patient's plan of care was discussed and created with Dr. Florencio and he is in agreement.  Signed: Danita Bloch, PA-C  03/20/2024, 10:24 AM [facility]

## 2024-03-20 NOTE — Plan of Care (Signed)

## 2024-03-20 NOTE — Telephone Encounter (Signed)
 Pharmacy Patient Advocate Encounter  Insurance verification completed.    The patient is insured through [employer]. Patient has Medicare and is not eligible for a copay card, but may be able to apply for patient assistance or Medicare RX Payment Plan (Patient Must reach out to their plan, if eligible for payment plan), if available.    Ran test claim for Farxiga 10mg  and the current 30 day co-pay is $84.36.  Ran test claim for Jardiance 10mg  and the current 30 day co-pay is $88.54   This test claim was processed through [employer]- copay amounts may vary at other pharmacies due to [employer], or as the patient moves through the different stages of their insurance plan.

## 2024-03-20 NOTE — Progress Notes (Signed)
 Heart Failure Navigator Progress Note  Current [facility] Advanced Heart Failure Team patient of Ezra Shuck, MD. Patient currently hospitalized and already has a previously scheduled CHF follow-up.  Appointment added to patient AVS and appointment card given as well.  Navigator will sign off at this time.  Charmaine Pines, RN, BSN [facility] Heart Failure Navigator Secure Chat Only

## 2024-03-20 NOTE — Progress Notes (Signed)
 [facility]  ROUNDING NOTE   Subjective:   Jermaine Kramer 73 y.o. male with past medical history of atrial fibrillation, hyperlipidemia, diabetes mellitus type 2 with chronic kidney disease, chronic systolic heart failure ejection fraction 20 to 25%, obstructive sleep apnea, hypertension, history of CVA, GERD, coronary disease status post CABG who returns now with shortness of breath and acute exacerbation of chronic systolic heart failure.  Patient presents to the emergency department with increased lower extremity edema and has been admitted for Anasarca [R60.1] Acute exacerbation of CHF (congestive heart failure) (HCC) [I50.9] Acute congestive heart failure, unspecified heart failure type [facility]) [I50.9]  Patient is known to our practice and is followed outpatient by Dr. Marcelino.  Patient was seen in office yesterday with concern for lower extremity edema and progressive shortness of breath.  Patient was encouraged by nephrology to seek assistance in emergency department.  Patient states he has been taking all prescribed medications and diuretics as ordered.  States he has been short of breath for days now but has worsened in the past couple days.  States he took a daily dose of metolazone  for the past 5 days.  Patient is seen sitting up in bed, wife at bedside.  Remains on room air.  Labs on ED arrival concerning for sodium 124, BUN 53, creatinine 2.85 with GFR 22, P BNP 19,000, and hemoglobin 11.2.  Chest x-ray shows small right and trace left pleural effusions with right basilar atelectasis versus infiltrates.  Abdominal ultrasound shows mild ascites.  We have been consulted to assist in management of volume overload.   Objective:  Vital signs in last 24 hours:  Temp:  [97.6 F (36.4 C)-98.3 F (36.8 C)] 98.2 F (36.8 C) (11/20 1218) Pulse Rate:  [74-94] 86 (11/20 1218) Resp:  [13-25] 20 (11/20 1218) BP: (86-113)/(65-80) 105/77 (11/20 1218) SpO2:  [93 %-99 %] 97 % (11/20  1218) Weight:  [90.6 kg] 90.6 kg (11/20 0503)  Weight change:  Filed Weights   03/19/24 1035 03/20/24 0503  Weight: 88 kg 90.6 kg    Intake/Output: I/O last 3 completed shifts: In: 137.2 [P.O.:120; I.V.:17.2] Out: 700 [Urine:700]   Intake/Output this shift:  Total I/O In: 0  Out: 450 [Urine:450]  Physical Exam: General: NAD, sitting up in bed  Head: Normocephalic, atraumatic. Moist oral mucosal membranes  Eyes: Anicteric  Lungs:  Clear to auscultation, on room air  Heart: Regular rate and rhythm  Abdomen:  Soft, nontender  Extremities: 3+ peripheral edema.  Neurologic: Awake, alert, conversant  Skin: Warm,dry, no rash  Access: None    Basic Metabolic Panel: Recent Labs  Lab 03/19/24 1038 03/19/24 1918 03/20/24 0054 03/20/24 0803 03/20/24 1258  NA 124* 123* 124* 123* 123*  K 4.4 4.1 4.5 4.2 4.0  CL 87* 85* 87* 87* 85*  CO2 26 24 28 25 27   GLUCOSE 159* 203* 142* 129* 231*  BUN 53* 49* 51* 50* 51*  CREATININE 2.85* 2.63* 2.71* 2.51* 2.54*  CALCIUM  8.8* 8.7* 8.8* 8.8* 8.8*  MG 1.7  --   --   --   --   PHOS 3.8 3.6 3.7 3.8 3.9    Liver Function Tests: Recent Labs  Lab 03/19/24 1038 03/19/24 1918 03/20/24 0054 03/20/24 0803 03/20/24 1258  AST 17  --   --   --   --   ALT 8  --   --   --   --   ALKPHOS 76  --   --   --   --  BILITOT 0.8  --   --   --   --   PROT 6.2*  --   --   --   --   ALBUMIN 3.9 3.7 3.8 3.8 3.9   No results for input(s): LIPASE, AMYLASE in the last 168 hours. No results for input(s): AMMONIA in the last 168 hours.  CBC: Recent Labs  Lab 03/19/24 1038 03/20/24 0803  WBC 6.0 5.6  HGB 11.2* 11.0*  HCT 34.6* 32.9*  MCV 76.9* 74.4*  PLT 177 166    Cardiac Enzymes: No results for input(s): CKTOTAL, CKMB, CKMBINDEX, TROPONINI in the last 168 hours.  BNP: Invalid input(s): POCBNP  CBG: Recent Labs  Lab 03/20/24 0808 03/20/24 1226  GLUCAP 159* 254*    Microbiology: Results for orders placed or  performed during the hospital encounter of 01/19/24  Resp panel by RT-PCR (RSV, Flu A&B, Covid) Anterior Nasal Swab     Status: None   Collection Time: 01/19/24  7:22 AM   Specimen: Anterior Nasal Swab  Result Value Ref Range Status   SARS Coronavirus 2 by RT PCR NEGATIVE NEGATIVE Final    Comment: (NOTE) SARS-CoV-2 target nucleic acids are NOT DETECTED.  The SARS-CoV-2 RNA is generally detectable in upper respiratory specimens during the acute phase of infection. The lowest concentration of SARS-CoV-2 viral copies this assay can detect is 138 copies/mL. A negative result does not preclude SARS-Cov-2 infection and should not be used as the sole basis for treatment or other patient management decisions. A negative result may occur with  improper specimen collection/handling, submission of specimen other than nasopharyngeal swab, presence of viral mutation(s) within the areas targeted by this assay, and inadequate number of viral copies(<138 copies/mL). A negative result must be combined with clinical observations, patient history, and epidemiological information. The expected result is Negative.  Fact Sheet for Patients:  bloggercourse.com  Fact Sheet for Healthcare Providers:  seriousbroker.it  This test is no t yet approved or cleared by the [place]  FDA and  has been authorized for detection and/or diagnosis of SARS-CoV-2 by FDA under an Emergency Use Authorization (EUA). This EUA will remain  in effect (meaning this test can be used) for the duration of the COVID-19 declaration under Section 564(b)(1) of the Act, 21 U.S.C.section 360bbb-3(b)(1), unless the authorization is terminated  or revoked sooner.       Influenza A by PCR NEGATIVE NEGATIVE Final   Influenza B by PCR NEGATIVE NEGATIVE Final    Comment: (NOTE) The Xpert Xpress SARS-CoV-2/FLU/RSV plus assay is intended as an aid in the diagnosis of influenza from  Nasopharyngeal swab specimens and should not be used as a sole basis for treatment. Nasal washings and aspirates are unacceptable for Xpert Xpress SARS-CoV-2/FLU/RSV testing.  Fact Sheet for Patients: bloggercourse.com  Fact Sheet for Healthcare Providers: seriousbroker.it  This test is not yet approved or cleared by the [place]  FDA and has been authorized for detection and/or diagnosis of SARS-CoV-2 by FDA under an Emergency Use Authorization (EUA). This EUA will remain in effect (meaning this test can be used) for the duration of the COVID-19 declaration under Section 564(b)(1) of the Act, 21 U.S.C. section 360bbb-3(b)(1), unless the authorization is terminated or revoked.     Resp Syncytial Virus by PCR NEGATIVE NEGATIVE Final    Comment: (NOTE) Fact Sheet for Patients: bloggercourse.com  Fact Sheet for Healthcare Providers: seriousbroker.it  This test is not yet approved or cleared by the [place]  FDA and has been authorized for detection  and/or diagnosis of SARS-CoV-2 by FDA under an Emergency Use Authorization (EUA). This EUA will remain in effect (meaning this test can be used) for the duration of the COVID-19 declaration under Section 564(b)(1) of the Act, 21 U.S.C. section 360bbb-3(b)(1), unless the authorization is terminated or revoked.  Performed at [facility] [address] [place], [place] [address]     Coagulation Studies: No results for input(s): LABPROT, INR in the last 72 hours.  Urinalysis: No results for input(s): COLORURINE, LABSPEC, PHURINE, GLUCOSEU, HGBUR, BILIRUBINUR, KETONESUR, PROTEINUR, UROBILINOGEN, NITRITE, LEUKOCYTESUR in the last 72 hours.  Invalid input(s): APPERANCEUR    Imaging: CUP PACEART REMOTE DEVICE CHECK Result Date: 03/20/2024 ICD: Scheduled remote reviewed. Normal device  function.  Presenting rhythm: AS/VS PAF, some episodes FFRWS, Eliquis  per EPIC EMR, AF burden 0.5%, longest 10 min 56 sec 12 NSVT, V>A, V- rates 200-250 bpm  2 VT in monitor only, > 20 beats, 201 bpm (03/01/2024 #42) 171 bpm (02/03/2024 #36) Abnormal HF diagnostics this monitoring period Next remote transmission per protocol.  ML, CVRS  US  ABDOMEN LIMITED RUQ (LIVER/GB) Result Date: 03/19/2024 EXAM: Right Upper Quadrant Abdominal Ultrasound 03/19/2024 02:54:40 PM TECHNIQUE: Real-time ultrasonography of the right upper quadrant of the abdomen was performed. COMPARISON: US  Abdomen 01/19/2024. CLINICAL HISTORY: Ascites, cholecystectomy. FINDINGS: LIVER: The liver demonstrates normal echogenicity. No intrahepatic biliary ductal dilatation. No evidence of mass. Mild ascites is noted in the right upper quadrant around the liver. BILIARY SYSTEM: Status post cholecystectomy. Common bile duct is within normal limits measuring 3.1 mm. IMPRESSION: 1. Mild ascites in the right upper quadrant around the liver. 2. Status post cholecystectomy. Electronically signed by: Lynwood Seip MD 03/19/2024 03:24 PM EST RP Workstation: [id number]   DG Chest 2 View Result Date: 03/19/2024 CLINICAL DATA:  Shortness of breath. EXAM: DG CHEST 2V COMPARISON:  Chest [place]  dated 02/11/2024. FINDINGS: Small right pleural effusion and right lung base atelectasis. Trace left pleural effusion suspected pneumonia is not excluded. No pneumothorax. Stable cardiomegaly. Bilateral pectoral pacemaker devices. Median sternotomy wires. No acute osseous pathology. IMPRESSION: Small right and trace left pleural effusion and right lung base atelectasis or infiltrate, relatively similar to prior radiograph. Electronically Signed   By: Vanetta Chou M.D.   On: 03/19/2024 11:39     Medications:    furosemide  (LASIX ) 200 mg in dextrose  5 % 100 mL (2 mg/mL) infusion 8 mg/hr (03/20/24 1259)    apixaban   5 mg Oral BID   atorvastatin   80 mg Oral  Daily   calcitRIOL  0.25 mcg Oral Daily   [START ON 03/21/2024] metolazone   2.5 mg Oral Once per day on Monday Wednesday Friday   metoprolol  succinate  12.5 mg Oral QPM   midodrine   5 mg Oral TID WC   pantoprazole   40 mg Oral Daily   sodium chloride  flush  3 mL Intravenous Q12H   tamsulosin   0.4 mg Oral QPC supper   traZODone   50 mg Oral QHS   acetaminophen  **OR** acetaminophen , albuterol , melatonin, senna-docusate  Assessment/ Plan:  [name] is a 73 y.o.  male with past medical history of atrial fibrillation, hyperlipidemia, diabetes mellitus type 2 with chronic kidney disease, chronic systolic heart failure ejection fraction 20 to 25%, obstructive sleep apnea, hypertension, history of CVA, GERD, coronary disease status post CABG who returns now with shortness of breath and acute exacerbation of chronic systolic heart failure.   Chronic kidney disease stage IV.  Baseline creatinine 2.4 with GFR 27 on 02/19/2024.  Patient  followed by Dr. Marcelino outpatient.  Described outpatient diabetics, reports compliance.  Will continue to monitor renal function during diuretic therapy.  Lab Results  Component Value Date   CREATININE 2.54 (H) 03/20/2024   CREATININE 2.51 (H) 03/20/2024   CREATININE 2.71 (H) 03/20/2024    Intake/Output Summary (Last 24 hours) at 03/20/2024 1501 Last data filed at 03/20/2024 0900 Gross per 24 hour  Intake 137.24 ml  Output 1150 ml  Net -1012.76 ml   2.  Acute on chronic systolic heart failure.  Recent outpatient echo completed on 03/14/2024 shows EF 25%.  IV furosemide  ordered last night at 8 mg/h.  Will continue this today.  Will also order metolazone  2.5 mg 3 times weekly.  Will monitor volume status.  3. Anemia of chronic kidney disease Lab Results  Component Value Date   HGB 11.0 (L) 03/20/2024    Hemoglobin within optimal range.  No need for ESA's at this time.  4. Diabetes mellitus type II with chronic kidney disease/renal manifestations:  noninsulin dependent. Most recent hemoglobin A1c is 7.1 on 01/19/2024.     LOS: 1 Jermaine Kramer 11/20/20253:01 PM

## 2024-03-20 NOTE — TOC Initial Note (Signed)
 Transition of Care [facility]) - Initial/Assessment Note    Patient Details  Name: Jermaine Kramer MRN: [id number] Date of Birth: 03/28/1951  Transition of [facility]) CM/SW Contact:    [name], LCSW Phone Number: 03/20/2024, 2:35 PM  Clinical Narrative:  Readmission prevention screen complete. CSW met with patient. Wife at bedside. CSW introduced role and explained that discharge planning would be discussed. PCP is Constance Monte, PA. Wife drives him to appointments. [facility] [place]. No issues affording medications. Patient lives home with his wife. No home health prior to admission. He has a rollator that he uses when out in the community. No further concerns. CSW will continue to follow patient and his wife for support and facilitate return home once stable. His wife will transport him home at discharge.                Expected Discharge Plan: Home/Self Care Barriers to Discharge: Continued Medical Work up   Patient Goals and CMS Choice            Expected Discharge Plan and Services     Post Acute Care Choice: NA Living arrangements for the past 2 months: Mobile Home                                      Prior Living Arrangements/Services Living arrangements for the past 2 months: Mobile Home Lives with:: Spouse Patient language and need for interpreter reviewed:: Yes Do you feel safe going back to the place where you live?: Yes      Need for Family Participation in Patient Care: Yes (Comment) Care giver support system in place?: Yes (comment) Current home services: DME Criminal Activity/Legal Involvement Pertinent to Current Situation/Hospitalization: No - Comment as needed  Activities of Daily Living      Permission Sought/Granted Permission sought to share information with : Family Supports Permission granted to share information with : Yes, Verbal Permission Granted  Share Information with NAME: Angelina Neece     Permission granted to share info  w Relationship: Wife  Permission granted to share info w Contact Information: [phone]  Emotional Assessment Appearance:: Appears stated age Attitude/Demeanor/Rapport: Engaged, Gracious Affect (typically observed): Accepting, Appropriate, Calm, Pleasant Orientation: : Oriented to Self, Oriented to Place, Oriented to  Time, Oriented to Situation Alcohol / Substance Use: Not Applicable Psych Involvement: No (comment)  Admission diagnosis:  Anasarca [R60.1] Acute exacerbation of CHF (congestive heart failure) (HCC) [I50.9] Acute congestive heart failure, unspecified heart failure type (HCC) [I50.9] Patient Active Problem List   Diagnosis Date Noted   CKD stage 3b, GFR 30-44 ml/min (HCC) 03/20/2024   Acute exacerbation of CHF (congestive heart failure) (HCC) 03/19/2024   DM2 (diabetes mellitus, type 2) (HCC) 02/12/2024   CHF (congestive heart failure) (HCC) 01/19/2024   UTI (urinary tract infection) 06/09/2022   Abdominal pain 06/09/2022   Elevated lactic acid level 06/09/2022   Gastroenteritis and colitis, viral 10/05/2021   Acute gastroenteritis, rotavirus 10/04/2021   AKI (acute kidney injury) with metabolic acidosis(HCC) 10/04/2021   Diabetes mellitus without complication (HCC)    Hypokalemia    Hypotension    Dental caries    Acute pulmonary edema (HCC)    Left flank pain 07/24/2021   Stroke (HCC) 07/24/2021   Chronic combined systolic and diastolic congestive heart failure (HCC) 07/24/2021   Atrial fibrillation, chronic (HCC) 07/24/2021   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  Kidney stone on left side 07/24/2021   HFrEF (heart failure with reduced ejection fraction) (HCC) 06/22/2021   Ischemic cardiomyopathy 06/22/2021   CAD s/p CABG x 3 06/22/2021   Hyperlipidemia LDL goal <70 06/22/2021   Acute CHF (congestive heart failure) (HCC) 05/14/2021   Incomplete emptying of bladder 05/04/2021   Acute respiratory failure with hypoxia (HCC)    Troponin I above reference  range    COVID 04/21/2021   Postoperative atrial fibrillation (HCC) 04/21/2021   GERD (gastroesophageal reflux disease)    Hypertension    Acute parotitis 02/27/2021   Volume overload 02/19/2021   Thrombocytopenia 02/18/2021   Receiving inotropic medication 02/18/2021   Postoperative pain 02/18/2021   On enteral nutrition 02/18/2021   Postoperative anemia due to acute blood loss 02/18/2021   Altered mental status 02/18/2021   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   Bradycardia 12/10/2020   Epididymo-orchitis 11/30/2020   Urinary retention 04/09/2020   Obesity (BMI 30.0-34.9) 05/14/2017   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 [place] [address] Phone: [phone] Fax: [phone]  [id number] [place], [place] - [address] [place] [address] Phone: [phone] Fax: [phone]  [facility] - [facility] [address] [place] [address] Phone: [phone] Fax: [phone]  [facility] - [facility] [address] [place] [address] Phone: [phone] Fax: [phone]     Social Drivers of Health (SDOH) Social History: SDOH Screenings   Food Insecurity: No Food Insecurity (03/20/2024)  Housing: Low Risk  (03/20/2024)  Transportation Needs: No Transportation Needs (03/20/2024)  Utilities: Not At Risk (03/20/2024)  Depression (PHQ2-9): Low Risk  (01/31/2022)  Financial  Resource Strain: Low Risk  (03/12/2024)   Received from [facility]  Social Connections: Moderately Integrated (03/20/2024)  Tobacco Use: Low Risk  (03/19/2024)  Recent Concern: Tobacco Use - Medium Risk (03/19/2024)   Received from [facility]   SDOH Interventions:     Readmission Risk Interventions    03/20/2024    2:33 PM 02/12/2024   12:56 PM 06/11/2022   11:05 AM  Readmission Risk Prevention Plan  Transportation Screening Complete Complete Complete  PCP or Specialist Appt within 3-5 Days  Complete   Social Work Consult for Recovery Care Planning/Counseling  Complete   Palliative Care Screening  Not Applicable   Medication Review Oceanographer) Complete Complete Complete  PCP or Specialist appointment within 3-5 days of discharge Complete  Complete  SW Recovery Care/Counseling Consult Complete  Complete  Palliative Care Screening Not Applicable  Not Applicable  Skilled Nursing Facility Not Applicable  Not Applicable

## 2024-03-20 NOTE — Inpatient Diabetes Management (Signed)
 Inpatient Diabetes Program Recommendations  AACE/ADA: New Consensus Statement on Inpatient Glycemic Control  Target Ranges:  Prepandial:   less than 140 mg/dL      Peak postprandial:   less than 180 mg/dL (1-2 hours)      Critically ill patients:  140 - 180 mg/dL    Latest Reference Range & Units 03/20/24 08:08 03/20/24 12:26  Glucose-Capillary 70 - 99 mg/dL 840 (H) 745 (H)    Review of Glycemic Control  Diabetes history: DM2 Outpatient Diabetes medications: Metformin  500 mg BID, FreeStyle Libre 2 Current orders for Inpatient glycemic control: None  Inpatient Diabetes Program Recommendations:    Insulin : CBG 254 mg/dl at 87:73 today.  Please consider ordering Novolog  0-9 units TID with meals and Novolog  0-5 units at bedtime.  Thanks, Earnie Gainer, RN, MSN, CDCES Diabetes Coordinator Inpatient Diabetes Program [phone] (Team Pager from 8am to 5pm)

## 2024-03-21 DIAGNOSIS — N1832 Chronic kidney disease, stage 3b: Secondary | ICD-10-CM

## 2024-03-21 DIAGNOSIS — Z7189 Other specified counseling: Secondary | ICD-10-CM

## 2024-03-21 DIAGNOSIS — R601 Generalized edema: Secondary | ICD-10-CM | POA: Diagnosis not present

## 2024-03-21 DIAGNOSIS — Z515 Encounter for palliative care: Secondary | ICD-10-CM | POA: Diagnosis not present

## 2024-03-21 DIAGNOSIS — I482 Chronic atrial fibrillation, unspecified: Secondary | ICD-10-CM

## 2024-03-21 DIAGNOSIS — I5043 Acute on chronic combined systolic (congestive) and diastolic (congestive) heart failure: Secondary | ICD-10-CM | POA: Diagnosis not present

## 2024-03-21 DIAGNOSIS — E66811 Obesity, class 1: Secondary | ICD-10-CM

## 2024-03-21 LAB — MAGNESIUM: Magnesium: 2.4 mg/dL (ref 1.7–2.4)

## 2024-03-21 LAB — RENAL FUNCTION PANEL
Albumin: 3.8 g/dL (ref 3.5–5.0)
Albumin: 3.8 g/dL (ref 3.5–5.0)
Anion gap: 14 (ref 5–15)
Anion gap: 12 (ref 5–15)
BUN: 49 mg/dL — ABNORMAL HIGH (ref 8–23)
BUN: 50 mg/dL — ABNORMAL HIGH (ref 8–23)
CO2: 26 mmol/L (ref 22–32)
CO2: 27 mmol/L (ref 22–32)
Calcium: 8.8 mg/dL — ABNORMAL LOW (ref 8.9–10.3)
Calcium: 8.8 mg/dL — ABNORMAL LOW (ref 8.9–10.3)
Chloride: 85 mmol/L — ABNORMAL LOW (ref 98–111)
Chloride: 87 mmol/L — ABNORMAL LOW (ref 98–111)
Creatinine, Ser: 2.55 mg/dL — ABNORMAL HIGH (ref 0.61–1.24)
Creatinine, Ser: 2.45 mg/dL — ABNORMAL HIGH (ref 0.61–1.24)
GFR, Estimated: 26 mL/min — ABNORMAL LOW (ref >60)
GFR, Estimated: 27 mL/min — ABNORMAL LOW (ref >60)
Glucose, Bld: 149 mg/dL — ABNORMAL HIGH (ref 70–99)
Glucose, Bld: 135 mg/dL — ABNORMAL HIGH (ref 70–99)
Phosphorus: 4.4 mg/dL (ref 2.5–4.6)
Phosphorus: 4.4 mg/dL (ref 2.5–4.6)
Potassium: 4 mmol/L (ref 3.5–5.1)
Potassium: 3.6 mmol/L (ref 3.5–5.1)
Sodium: 125 mmol/L — ABNORMAL LOW (ref 135–145)
Sodium: 125 mmol/L — ABNORMAL LOW (ref 135–145)

## 2024-03-21 LAB — BASIC METABOLIC PANEL WITH GFR
Anion gap: 13 (ref 5–15)
BUN: 49 mg/dL — ABNORMAL HIGH (ref 8–23)
CO2: 26 mmol/L (ref 22–32)
Calcium: 8.6 mg/dL — ABNORMAL LOW (ref 8.9–10.3)
Chloride: 86 mmol/L — ABNORMAL LOW (ref 98–111)
Creatinine, Ser: 2.48 mg/dL — ABNORMAL HIGH (ref 0.61–1.24)
GFR, Estimated: 26 mL/min — ABNORMAL LOW (ref >60)
Glucose, Bld: 132 mg/dL — ABNORMAL HIGH (ref 70–99)
Potassium: 3.7 mmol/L (ref 3.5–5.1)
Sodium: 125 mmol/L — ABNORMAL LOW (ref 135–145)

## 2024-03-21 LAB — GLUCOSE, CAPILLARY
Glucose-Capillary: 133 mg/dL — ABNORMAL HIGH (ref 70–99)
Glucose-Capillary: 204 mg/dL — ABNORMAL HIGH (ref 70–99)
Glucose-Capillary: 185 mg/dL — ABNORMAL HIGH (ref 70–99)
Glucose-Capillary: 141 mg/dL — ABNORMAL HIGH (ref 70–99)

## 2024-03-21 MED ORDER — ONDANSETRON 4 MG PO TBDP
4.0000 mg | ORAL_TABLET | Freq: Four times a day (QID) | ORAL | Status: DC | PRN
  Administered 2024-03-21 – 2024-03-23 (×2): 4 mg via ORAL
  Filled 2024-03-21 (×3): qty 1

## 2024-03-21 MED ORDER — POTASSIUM CHLORIDE CRYS ER 20 MEQ PO TBCR
40.0000 meq | EXTENDED_RELEASE_TABLET | Freq: Once | ORAL | Status: AC
  Administered 2024-03-21: 40 meq via ORAL
  Filled 2024-03-21: qty 2

## 2024-03-21 NOTE — Plan of Care (Signed)

## 2024-03-21 NOTE — Progress Notes (Signed)
 [facility]    SUBJECTIVE: Patient resting comfortably in bed still with some shortness of breath dyspnea lower extremity edema weakness and fatigue denies any chest pain   Vitals:   03/21/24 0800 03/21/24 1157 03/21/24 1520 03/21/24 1837  BP: 94/70 100/72 102/75 (!) 88/64  Pulse:  81 81   Resp:      Temp:  97.9 F (36.6 C) 97.7 F (36.5 C)   TempSrc:      SpO2:  90% 99% 97%  Weight:      Height:         Intake/Output Summary (Last 24 hours) at 03/21/2024 1928 Last data filed at 03/21/2024 1435 Gross per 24 hour  Intake 974.88 ml  Output 2000 ml  Net -1025.12 ml      PHYSICAL EXAM  General: Well developed, well nourished, in no acute distress HEENT:  Normocephalic and atramatic Neck:  No JVD.  Lungs: Clear bilaterally to auscultation and percussion. Heart: Irregular irregular. Normal S1 and S2 without gallops or murmurs.  Abdomen: Bowel sounds are positive, abdomen soft and non-tender  Msk:  Back normal, normal gait. Normal strength and tone for age. Extremities: No clubbing, cyanosis or 3+edema.   Neuro: Alert and oriented X 3. Psych:  Good affect, responds appropriately   LABS: Basic Metabolic Panel: Recent Labs    03/19/24 1038 03/19/24 1918 03/21/24 0030 03/21/24 0340 03/21/24 0640  NA 124*   < > 125* 125* 125*  K 4.4   < > 4.0 3.7 3.6  CL 87*   < > 85* 86* 87*  CO2 26   < > 26 26 27   GLUCOSE 159*   < > 149* 132* 135*  BUN 53*   < > 49* 49* 50*  CREATININE 2.85*   < > 2.55* 2.48* 2.45*  CALCIUM  8.8*   < > 8.8* 8.6* 8.8*  MG 1.7  --   --  2.4  --   PHOS 3.8   < > 4.4  --  4.4   < > = values in this interval not displayed.   Liver Function Tests: Recent Labs    03/19/24 1038 03/19/24 1918 03/21/24 0030 03/21/24 0640  AST 17  --   --   --   ALT 8  --   --   --   ALKPHOS 76  --   --   --   BILITOT 0.8  --   --   --   PROT 6.2*  --   --   --   ALBUMIN  3.9   < > 3.8 3.8   < > = values in this interval not displayed.   No results for  input(s): LIPASE, AMYLASE in the last 72 hours. CBC: Recent Labs    03/19/24 1038 03/20/24 0803  WBC 6.0 5.6  HGB 11.2* 11.0*  HCT 34.6* 32.9*  MCV 76.9* 74.4*  PLT 177 166   Cardiac Enzymes: No results for input(s): CKTOTAL, CKMB, CKMBINDEX, TROPONINI in the last 72 hours. BNP: Invalid input(s): POCBNP D-Dimer: No results for input(s): DDIMER in the last 72 hours. Hemoglobin A1C: No results for input(s): HGBA1C in the last 72 hours. Fasting Lipid Panel: No results for input(s): CHOL, HDL, LDLCALC, TRIG, CHOLHDL, LDLDIRECT in the last 72 hours. Thyroid Function Tests: No results for input(s): TSH, T4TOTAL, T3FREE, THYROIDAB in the last 72 hours.  Invalid input(s): FREET3 Anemia Panel: No results for input(s): VITAMINB12, FOLATE, FERRITIN, TIBC, IRON, RETICCTPCT in the last 72 hours.  No results found.  Echo severely depressed left ventricular function EF around 20 to 25%   TELEMETRY: Atrial fibrillation rate of 80 diffuse nonspecific ST-T changes:  ASSESSMENT AND PLAN:  Principal Problem:   Acute exacerbation of CHF (congestive heart failure) (HCC) Active Problems:   GERD (gastroesophageal reflux disease)   Hypertension   HFrEF (heart failure with reduced ejection fraction) (HCC)   CAD s/p CABG x 3   Chronic combined systolic and diastolic congestive heart failure (HCC)   Atrial fibrillation, chronic (HCC)   Obesity (BMI 30.0-34.9)   BPH (benign prostatic hyperplasia)   DM2 (diabetes mellitus, type 2) (HCC)   CKD stage 3b, GFR 30-44 ml/min (HCC)    Plan Acute on chronic HFrEF continue GDMT to help with symptom control patient states he has improved shortness of breath still not completely back to normal but feels much better Multivessel coronary disease coronary bypass surgery 2022 no significant chest pain continue medical therapy Atrial fibrillation paroxysmal continue rate control anticoagulation with  Eliquis  Hyperlipidemia continue Lipitor  high intensity LDL goal should be less than 55 Chronic renal insufficiency maintain adequate renal perfusion consider nephrology input Increase activity consider physical therapy to help with overall management and care  Cara JONETTA Lovelace, MD 03/21/2024 7:28 PM

## 2024-03-21 NOTE — Progress Notes (Signed)
 PROGRESS NOTE    Jermaine Kramer  FMW:[id number] DOB: 06-Apr-1951 DOA: 03/19/2024 PCP: Perri Constance Sor, PA-C  Outpatient Specialists: cardiology, nephrology    Brief Narrative:   From admission h and p  Jermaine Kramer is a 73 y.o. year old male with medical history of hypertension, hyperlipidemia c/b by CAD status post CABG, type 2 diabetes, HFrEF (EF 25% in 2024) status post ICD with pacemaker, atrial fibrillation presenting to the ED from his nephrology office for anasarca and complaints of weight gain and dyspnea.     Pt states he states he has been retaining fluid over the last few months. Pt reports urinating less and less over this time period. He has tried outpatient IV lasix  device at home about 7 weeks ago. He is taking his medications very regularly. Baseline weight is 180 lb. He is having some coughing with some sputum production. Denies any fevers, or chills.      On arrival to the ED patient was noted to be HDS stable.  Lab work and imaging obtained.  CBC with no leukocytosis, mild anemia at baseline.  BMP with hyponatremia at 124 with hypochloremia, AKI on CKD with creatinine at 2.8 with baseline around 2.  proBNP elevated at 19.4k.  Chest x-ray with trace bilateral pleural effusions, right upper quadrant obtained by EDP given concern for ascites and pending.  Given need for continued care, [facility] contacted for admission.  Assessment & Plan:   Principal Problem:   Acute exacerbation of CHF (congestive heart failure) (HCC) Active Problems:   GERD (gastroesophageal reflux disease)   Hypertension   HFrEF (heart failure with reduced ejection fraction) (HCC)   CAD s/p CABG x 3   Chronic combined systolic and diastolic congestive heart failure (HCC)   Atrial fibrillation, chronic (HCC)   Obesity (BMI 30.0-34.9)   BPH (benign prostatic hyperplasia)   DM2 (diabetes mellitus, type 2) (HCC)   CKD stage 3b, GFR 30-44 ml/min (HCC)  # HFrEF with acute exacerbation # ICD  status 3rd hospitalization in as many months, significant edema mainly in abdomen, small pleural effusions as well. EF 25% on recent TTE. Diuresing, out 1.4 last 24 - continue lasix  gtt, home metolazone  - cards ok with continuing bb for now - strict I/Os, fluid restrict 1.2 liters - palliative consult   # Hyponatremia Likely 2/2 chf exacerbation. 125 today - diurese as above  # AKI on ckd 3b Baseline cr of around 2 here 2.5 - monitor while diuresing - nephrology following  # CAD s/p CABG x3 No chest pain - home apixaban   # A-fib Rate controlled - cont metop - cont home apixaban   # Hypotension, chronic - home midodrine   # BPH - home flomax   # T2DM Euglycemic - SSI - hold metformin  given worsening kidney function   DVT prophylaxis: apixaban  Code Status: full Family Communication: wife at bedside 11/21  Level of care: Progressive Status is: Inpatient Remains inpatient appropriate because: need for IV diuresis    Consultants:  Cardiology nephrology  Procedures: none  Antimicrobials:  none    Subjective: Reports feeling stable, dyspnea and swelling somewhat improved  Objective: Vitals:   03/21/24 0435 03/21/24 0747 03/21/24 0800 03/21/24 1157  BP:  (!) 81/61 94/70 100/72  Pulse:  68  81  Resp:      Temp:  98 F (36.7 C)  97.9 F (36.6 C)  TempSrc:      SpO2:  97%  90%  Weight: 89.2 kg     Height:  Intake/Output Summary (Last 24 hours) at 03/21/2024 1246 Last data filed at 03/21/2024 0959 Gross per 24 hour  Intake 654.88 ml  Output 1550 ml  Net -895.12 ml   Filed Weights   03/19/24 1035 03/20/24 0503 03/21/24 0435  Weight: 88 kg 90.6 kg 89.2 kg    Examination:  General exam: Appears calm and comfortable  Respiratory system: Clear to auscultation save for rales at bases Cardiovascular system: distant heart sounds Gastrointestinal system: Abdomen is severely distended, mildly tender Central nervous system: Alert and oriented.  No focal neurological deficits. Extremities: Symmetric 5 x 5 power. Mild lower extremity edema Skin: No rashes, lesions or ulcers Psychiatry: Judgement and insight appear normal. Mood & affect appropriate.     Data Reviewed: I have personally reviewed following labs and imaging studies  CBC: Recent Labs  Lab 03/19/24 1038 03/20/24 0803  WBC 6.0 5.6  HGB 11.2* 11.0*  HCT 34.6* 32.9*  MCV 76.9* 74.4*  PLT 177 166   Basic Metabolic Panel: Recent Labs  Lab 03/19/24 1038 03/19/24 1918 03/20/24 0803 03/20/24 1258 03/20/24 1753 03/21/24 0030 03/21/24 0340 03/21/24 0640  NA 124*   < > 123* 123* 122* 125* 125* 125*  K 4.4   < > 4.2 4.0 4.2 4.0 3.7 3.6  CL 87*   < > 87* 85* 85* 85* 86* 87*  CO2 26   < > 25 27 25 26 26 27   GLUCOSE 159*   < > 129* 231* 194* 149* 132* 135*  BUN 53*   < > 50* 51* 48* 49* 49* 50*  CREATININE 2.85*   < > 2.51* 2.54* 2.58* 2.55* 2.48* 2.45*  CALCIUM  8.8*   < > 8.8* 8.8* 8.7* 8.8* 8.6* 8.8*  MG 1.7  --   --   --   --   --  2.4  --   PHOS 3.8   < > 3.8 3.9 4.2 4.4  --  4.4   < > = values in this interval not displayed.   GFR: Estimated Creatinine Clearance: 26.8 mL/min (A) (by C-G formula based on SCr of 2.45 mg/dL (H)). Liver Function Tests: Recent Labs  Lab 03/19/24 1038 03/19/24 1918 03/20/24 0803 03/20/24 1258 03/20/24 1753 03/21/24 0030 03/21/24 0640  AST 17  --   --   --   --   --   --   ALT 8  --   --   --   --   --   --   ALKPHOS 76  --   --   --   --   --   --   BILITOT 0.8  --   --   --   --   --   --   PROT 6.2*  --   --   --   --   --   --   ALBUMIN  3.9   < > 3.8 3.9 3.8 3.8 3.8   < > = values in this interval not displayed.   No results for input(s): LIPASE, AMYLASE in the last 168 hours. No results for input(s): AMMONIA in the last 168 hours. Coagulation Profile: No results for input(s): INR, PROTIME in the last 168 hours. Cardiac Enzymes: No results for input(s): CKTOTAL, CKMB, CKMBINDEX, TROPONINI in  the last 168 hours. BNP (last 3 results) Recent Labs    03/19/24 1038  PROBNP 19,467.0*   HbA1C: No results for input(s): HGBA1C in the last 72 hours. CBG: Recent Labs  Lab 03/20/24 1226 03/20/24 1652  03/20/24 2209 03/21/24 0748 03/21/24 1158  GLUCAP 254* 181* 153* 133* 204*   Lipid Profile: No results for input(s): CHOL, HDL, LDLCALC, TRIG, CHOLHDL, LDLDIRECT in the last 72 hours. Thyroid Function Tests: No results for input(s): TSH, T4TOTAL, FREET4, T3FREE, THYROIDAB in the last 72 hours. Anemia Panel: No results for input(s): VITAMINB12, FOLATE, FERRITIN, TIBC, IRON, RETICCTPCT in the last 72 hours. Urine analysis:    Component Value Date/Time   COLORURINE YELLOW (A) 04/05/2023 1925   APPEARANCEUR CLEAR (A) 04/05/2023 1925   APPEARANCEUR CLEAR 01/15/2013 1659   LABSPEC 1.011 04/05/2023 1925   LABSPEC 1.005 01/15/2013 1659   PHURINE 5.0 04/05/2023 1925   GLUCOSEU NEGATIVE 04/05/2023 1925   GLUCOSEU NEGATIVE 01/15/2013 1659   HGBUR SMALL (A) 04/05/2023 1925   BILIRUBINUR NEGATIVE 04/05/2023 1925   BILIRUBINUR NEGATIVE 01/15/2013 1659   KETONESUR NEGATIVE 04/05/2023 1925   PROTEINUR NEGATIVE 04/05/2023 1925   NITRITE NEGATIVE 04/05/2023 1925   LEUKOCYTESUR TRACE (A) 04/05/2023 1925   LEUKOCYTESUR 1+ 01/15/2013 1659   Sepsis Labs: @LABRCNTIP (procalcitonin:4,lacticidven:4)  )No results found for this or any previous visit (from the past 240 hours).       Radiology Studies: US  ABDOMEN LIMITED RUQ (LIVER/GB) Result Date: 03/19/2024 EXAM: Right Upper Quadrant Abdominal Ultrasound 03/19/2024 02:54:40 PM TECHNIQUE: Real-time ultrasonography of the right upper quadrant of the abdomen was performed. COMPARISON: US  Abdomen 01/19/2024. CLINICAL HISTORY: Ascites, cholecystectomy. FINDINGS: LIVER: The liver demonstrates normal echogenicity. No intrahepatic biliary ductal dilatation. No evidence of mass. Mild ascites is noted in the right  upper quadrant around the liver. BILIARY SYSTEM: Status post cholecystectomy. Common bile duct is within normal limits measuring 3.1 mm. IMPRESSION: 1. Mild ascites in the right upper quadrant around the liver. 2. Status post cholecystectomy. Electronically signed by: Lynwood Seip MD 03/19/2024 03:24 PM EST RP Workstation: [id number]        Scheduled Meds:  apixaban   5 mg Oral BID   atorvastatin   80 mg Oral Daily   calcitRIOL   0.25 mcg Oral Daily   insulin  aspart  0-5 Units Subcutaneous QHS   insulin  aspart  0-9 Units Subcutaneous TID WC   metolazone   2.5 mg Oral Once per day on Monday Wednesday Friday   metoprolol  succinate  12.5 mg Oral QPM   midodrine   5 mg Oral TID WC   pantoprazole   40 mg Oral Daily   tamsulosin   0.4 mg Oral QPC supper   Continuous Infusions:  furosemide  (LASIX ) 200 mg in dextrose  5 % 100 mL (2 mg/mL) infusion 8 mg/hr (03/21/24 1143)     LOS: 2 days     Jermaine KATHEE Ban, MD [facility]   If 7PM-7AM, please contact night-coverage [email or web address] Password [facility] 03/21/2024, 12:46 PM

## 2024-03-21 NOTE — Consult Note (Signed)
 Consultation Note Date: 03/21/2024   Patient Name: Jermaine Kramer  DOB: 1951-04-10  MRN: [id number]  Age / Sex: 73 y.o., male  PCP: Perri Constance Sor, PA-C Referring Physician: Kandis Devaughn Sayres, MD  Reason for Consultation: Establishing goals of care   HPI/Brief Hospital Course: 73 y.o. male  with past medical history of CAD s/p CABG, HFrEF-most recent EF 30%, ICD with pacemaker placement, A-fib, type 2 diabetes, hypertension, hyperlipidemia, CKD stage IV with positive ANA and anemia admitted on 03/19/2024 from nephrology office with anasarca and dyspnea.  Reportedly Jermaine Kramer had been retaining fluid over the last several months.  He reports greater than 10 pound weight gain and had noticed decreased urine output.  He has been seen in ED several times for similar complaints and treatments were provided such as IV Lasix .  On arrival to ED labs significant for creatinine of 2.8 with baseline around 2, proBNP elevated greater than 19,000 and chest x-ray revealing bilateral pleural effusions and possible concern for ascites--admitted and being treated for HFrEF with acute exacerbation requiring continuous Lasix  drip--cardiology and nephrology following closely  Palliative medicine was consulted for assisting with goals of care conversations.  Subjective:  Extensive chart review has been completed prior to meeting patient including labs, vital signs, imaging, progress notes, orders, and available advanced directive documents from current and previous encounters.  Visited with Jermaine Kramer at his bedside.  He is awake, alert and able to engage in conversation.  No family or visitors at bedside during time of visit  Introduced myself as a publishing rights manager as a member of the palliative care team. Explained palliative medicine is specialized medical care for people living with serious illness. It focuses on providing relief from the symptoms and stress of a serious  illness. The goal is to improve quality of life for both the patient and the family.   Jermaine Kramer provides a brief life review.  He has been married to his wife-Donna for 48 years.  They have 3 children in total, 2 daughters and 1 son.  He also has multiple grandchildren.  He retired from the city of [employer] working for the [employer].  Since retirement he has not lived a very active lifestyle.  He enjoys spending time with his family and babysitting his grandchildren.  Jermaine Kramer able to voice his understanding of events that led to hospitalization and brief overview of current hospitalization.  He reports being compliant with his medications at home and noted continued weight gain over the last several months.  He reports coming to a point where he was having difficulty breathing which prompted him to be seen by his nephrologist ultimately sending him to hospital for further evaluation.  We discussed patient's current illness and what it means in the larger context of patient's on-going co-morbidities. Natural disease trajectory and expectations at EOL were discussed.   Attempted to elicit goals of care.  Jermaine Kramer shares he has not completed advance directives in the past.  In the event he is unable to make his own medical decisions he wishes to appoint his wife-Donna as Lauraine get education officer, environmental.  He reports he has never completed a living will.  During her goals of care conversation Jermaine Kramer shares providers during this hospitalization have had conversations about his overall condition and prognosis.  At this time he becomes tearful and emotional and requests we no longer discuss this topic at this time.  He does agree to continued goals of care conversations through  the weekend.  Assessed symptoms.  He reports cramping in his hands that started about an hour ago-nursing staff aware.  Discussed with nursing staff concern for electrolyte abnormalities with cramping.  Jermaine Kramer reports  being prescribed a muscle relaxer overnight that relieved his hand cramping.  Nursing staff to be in conversation with primary team.  The difference between aggressive medical intervention and comfort care was discussed.  All questions/concerns addressed. Emotional support provided to patient/family/support persons. PMT will continue to follow and support patient as needed.   Objective: Primary Diagnoses: Present on Admission:  GERD (gastroesophageal reflux disease)  Hypertension  HFrEF (heart failure with reduced ejection fraction) (HCC)  Chronic combined systolic and diastolic congestive heart failure (HCC)  Atrial fibrillation, chronic (HCC)  BPH (benign prostatic hyperplasia)  Obesity (BMI 30.0-34.9)   Physical Exam Constitutional:      General: He is not in acute distress.    Appearance: He is ill-appearing.  HENT:     Head: Normocephalic.     Mouth/Throat:     Mouth: Mucous membranes are dry.  Pulmonary:     Effort: Pulmonary effort is normal. No respiratory distress.  Abdominal:     General: There is distension.  Skin:    General: Skin is warm and dry.  Neurological:     Mental Status: He is alert and oriented to person, place, and time.     Motor: Weakness present.  Psychiatric:        Mood and Affect: Mood normal.        Behavior: Behavior normal.        Thought Content: Thought content normal.     Vital Signs: BP 102/75 (BP Location: Left Arm)   Pulse 81   Temp 97.7 F (36.5 C)   Resp 19   Ht 5' 5 (1.651 m)   Wt 89.2 kg   SpO2 99%   BMI 32.72 kg/m  Pain Scale: 0-10   Pain Score: 0-No pain  IO: Intake/output summary:  Intake/Output Summary (Last 24 hours) at 03/21/2024 1732 Last data filed at 03/21/2024 1435 Gross per 24 hour  Intake 1094.88 ml  Output 2000 ml  Net -905.12 ml    LBM: Last BM Date : 03/19/24 Baseline Weight: Weight: 88 kg Most recent weight: Weight: 89.2 kg      Assessment and Plan  SUMMARY OF RECOMMENDATIONS   Will  continue goals of care conversations through weekend as Jermaine Kramer allows   Palliative Prophylaxis:   Bowel Regimen, Delirium Protocol and Frequent Pain Assessment  Thank you for this consult and allowing Palliative Medicine to participate in the care of Josefa MICAEL Wonda. Palliative medicine will continue to follow and assist as needed.   Visit includes: Detailed review of medical records (labs, imaging, vital signs), medically appropriate exam (mental status, respiratory, cardiac, skin), discussed with treatment team, counseling and educating patient, family and staff, documenting clinical information, medication management and coordination of care.   Signed by: Waddell Lesches, DNP, AGNP-C Palliative Medicine    Please contact Palliative Medicine Team phone at [phone] for questions and concerns.  For individual provider: See Tracey

## 2024-03-21 NOTE — Progress Notes (Signed)
 Remote ICD Transmission

## 2024-03-21 NOTE — Progress Notes (Signed)
 [facility]  ROUNDING NOTE   Subjective:   Jermaine Kramer 73 y.o. male with past medical history of atrial fibrillation, hyperlipidemia, diabetes mellitus type 2 with chronic kidney disease, chronic systolic heart failure ejection fraction 20 to 25%, obstructive sleep apnea, hypertension, history of CVA, GERD, coronary disease status post CABG who returns now with shortness of breath and acute exacerbation of chronic systolic heart failure.  Patient presents to the emergency department with increased lower extremity edema and has been admitted for Anasarca [R60.1] Acute exacerbation of CHF (congestive heart failure) (HCC) [I50.9] Acute congestive heart failure, unspecified heart failure type [facility]) [I50.9]  Patient is known to our practice and is followed outpatient by Dr. Marcelino.    Patient seen sitting up in bed Wife at bedside Furosemide  drip in place Room air   Objective:  Vital signs in last 24 hours:  Temp:  [97.7 F (36.5 C)-98.2 F (36.8 C)] 97.9 F (36.6 C) (11/21 1157) Pulse Rate:  [68-81] 81 (11/21 1157) Resp:  [12-22] 19 (11/21 0351) BP: (81-141)/(61-103) 100/72 (11/21 1157) SpO2:  [88 %-97 %] 90 % (11/21 1157) Weight:  [89.2 kg] 89.2 kg (11/21 0435)  Weight change: 1.202 kg Filed Weights   03/19/24 1035 03/20/24 0503 03/21/24 0435  Weight: 88 kg 90.6 kg 89.2 kg    Intake/Output: I/O last 3 completed shifts: In: 414.9 [P.O.:360; I.V.:54.9] Out: 2050 [Urine:2050]   Intake/Output this shift:  Total I/O In: 360 [P.O.:360] Out: 650 [Urine:650]  Physical Exam: General: NAD, sitting up in bed  Head: Normocephalic, atraumatic. Moist oral mucosal membranes  Eyes: Anicteric  Lungs:  Clear to auscultation, on room air  Heart: Regular rate and rhythm  Abdomen:  Soft, nontender  Extremities: 3+ peripheral edema.  Neurologic: Awake, alert, conversant  Skin: Warm,dry, no rash  Access: None    Basic Metabolic Panel: Recent Labs  Lab 03/19/24 1038  03/19/24 1918 03/20/24 0803 03/20/24 1258 03/20/24 1753 03/21/24 0030 03/21/24 0340 03/21/24 0640  NA 124*   < > 123* 123* 122* 125* 125* 125*  K 4.4   < > 4.2 4.0 4.2 4.0 3.7 3.6  CL 87*   < > 87* 85* 85* 85* 86* 87*  CO2 26   < > 25 27 25 26 26 27   GLUCOSE 159*   < > 129* 231* 194* 149* 132* 135*  BUN 53*   < > 50* 51* 48* 49* 49* 50*  CREATININE 2.85*   < > 2.51* 2.54* 2.58* 2.55* 2.48* 2.45*  CALCIUM  8.8*   < > 8.8* 8.8* 8.7* 8.8* 8.6* 8.8*  MG 1.7  --   --   --   --   --  2.4  --   PHOS 3.8   < > 3.8 3.9 4.2 4.4  --  4.4   < > = values in this interval not displayed.    Liver Function Tests: Recent Labs  Lab 03/19/24 1038 03/19/24 1918 03/20/24 0803 03/20/24 1258 03/20/24 1753 03/21/24 0030 03/21/24 0640  AST 17  --   --   --   --   --   --   ALT 8  --   --   --   --   --   --   ALKPHOS 76  --   --   --   --   --   --   BILITOT 0.8  --   --   --   --   --   --  PROT 6.2*  --   --   --   --   --   --   ALBUMIN  3.9   < > 3.8 3.9 3.8 3.8 3.8   < > = values in this interval not displayed.   No results for input(s): LIPASE, AMYLASE in the last 168 hours. No results for input(s): AMMONIA in the last 168 hours.  CBC: Recent Labs  Lab 03/19/24 1038 03/20/24 0803  WBC 6.0 5.6  HGB 11.2* 11.0*  HCT 34.6* 32.9*  MCV 76.9* 74.4*  PLT 177 166    Cardiac Enzymes: No results for input(s): CKTOTAL, CKMB, CKMBINDEX, TROPONINI in the last 168 hours.  BNP: Invalid input(s): POCBNP  CBG: Recent Labs  Lab 03/20/24 1226 03/20/24 1652 03/20/24 2209 03/21/24 0748 03/21/24 1158  GLUCAP 254* 181* 153* 133* 204*    Microbiology: Results for orders placed or performed during the hospital encounter of 01/19/24  Resp panel by RT-PCR (RSV, Flu A&B, Covid) Anterior Nasal Swab     Status: None   Collection Time: 01/19/24  7:22 AM   Specimen: Anterior Nasal Swab  Result Value Ref Range Status   SARS Coronavirus 2 by RT PCR NEGATIVE NEGATIVE Final     Comment: (NOTE) SARS-CoV-2 target nucleic acids are NOT DETECTED.  The SARS-CoV-2 RNA is generally detectable in upper respiratory specimens during the acute phase of infection. The lowest concentration of SARS-CoV-2 viral copies this assay can detect is 138 copies/mL. A negative result does not preclude SARS-Cov-2 infection and should not be used as the sole basis for treatment or other patient management decisions. A negative result may occur with  improper specimen collection/handling, submission of specimen other than nasopharyngeal swab, presence of viral mutation(s) within the areas targeted by this assay, and inadequate number of viral copies(<138 copies/mL). A negative result must be combined with clinical observations, patient history, and epidemiological information. The expected result is Negative.  Fact Sheet for Patients:  bloggercourse.com  Fact Sheet for Healthcare Providers:  seriousbroker.it  This test is no t yet approved or cleared by the [place]  FDA and  has been authorized for detection and/or diagnosis of SARS-CoV-2 by FDA under an Emergency Use Authorization (EUA). This EUA will remain  in effect (meaning this test can be used) for the duration of the COVID-19 declaration under Section 564(b)(1) of the Act, 21 U.S.C.section 360bbb-3(b)(1), unless the authorization is terminated  or revoked sooner.       Influenza A by PCR NEGATIVE NEGATIVE Final   Influenza B by PCR NEGATIVE NEGATIVE Final    Comment: (NOTE) The Xpert Xpress SARS-CoV-2/FLU/RSV plus assay is intended as an aid in the diagnosis of influenza from Nasopharyngeal swab specimens and should not be used as a sole basis for treatment. Nasal washings and aspirates are unacceptable for Xpert Xpress SARS-CoV-2/FLU/RSV testing.  Fact Sheet for Patients: bloggercourse.com  Fact Sheet for Healthcare  Providers: seriousbroker.it  This test is not yet approved or cleared by the [place]  FDA and has been authorized for detection and/or diagnosis of SARS-CoV-2 by FDA under an Emergency Use Authorization (EUA). This EUA will remain in effect (meaning this test can be used) for the duration of the COVID-19 declaration under Section 564(b)(1) of the Act, 21 U.S.C. section 360bbb-3(b)(1), unless the authorization is terminated or revoked.     Resp Syncytial Virus by PCR NEGATIVE NEGATIVE Final    Comment: (NOTE) Fact Sheet for Patients: bloggercourse.com  Fact Sheet for Healthcare Providers: seriousbroker.it  This test is  not yet approved or cleared by the [place]  FDA and has been authorized for detection and/or diagnosis of SARS-CoV-2 by FDA under an Emergency Use Authorization (EUA). This EUA will remain in effect (meaning this test can be used) for the duration of the COVID-19 declaration under Section 564(b)(1) of the Act, 21 U.S.C. section 360bbb-3(b)(1), unless the authorization is terminated or revoked.  Performed at [facility], [address] [place], [place] [address]     Coagulation Studies: No results for input(s): LABPROT, INR in the last 72 hours.  Urinalysis: No results for input(s): COLORURINE, LABSPEC, PHURINE, GLUCOSEU, HGBUR, BILIRUBINUR, KETONESUR, PROTEINUR, UROBILINOGEN, NITRITE, LEUKOCYTESUR in the last 72 hours.  Invalid input(s): APPERANCEUR    Imaging: US  ABDOMEN LIMITED RUQ (LIVER/GB) Result Date: 03/19/2024 EXAM: Right Upper Quadrant Abdominal Ultrasound 03/19/2024 02:54:40 PM TECHNIQUE: Real-time ultrasonography of the right upper quadrant of the abdomen was performed. COMPARISON: US  Abdomen 01/19/2024. CLINICAL HISTORY: Ascites, cholecystectomy. FINDINGS: LIVER: The liver demonstrates normal echogenicity. No intrahepatic  biliary ductal dilatation. No evidence of mass. Mild ascites is noted in the right upper quadrant around the liver. BILIARY SYSTEM: Status post cholecystectomy. Common bile duct is within normal limits measuring 3.1 mm. IMPRESSION: 1. Mild ascites in the right upper quadrant around the liver. 2. Status post cholecystectomy. Electronically signed by: Lynwood Seip MD 03/19/2024 03:24 PM EST RP Workstation: [id number]     Medications:    furosemide  (LASIX ) 200 mg in dextrose  5 % 100 mL (2 mg/mL) infusion 8 mg/hr (03/21/24 1143)    apixaban   5 mg Oral BID   atorvastatin   80 mg Oral Daily   calcitRIOL   0.25 mcg Oral Daily   insulin  aspart  0-5 Units Subcutaneous QHS   insulin  aspart  0-9 Units Subcutaneous TID WC   metolazone   2.5 mg Oral Once per day on Monday Wednesday Friday   metoprolol  succinate  12.5 mg Oral QPM   midodrine   5 mg Oral TID WC   pantoprazole   40 mg Oral Daily   tamsulosin   0.4 mg Oral QPC supper   acetaminophen  **OR** acetaminophen , albuterol , melatonin, methocarbamol , ondansetron , senna-docusate  Assessment/ Plan:  [name] is a 73 y.o.  male with past medical history of atrial fibrillation, hyperlipidemia, diabetes mellitus type 2 with chronic kidney disease, chronic systolic heart failure ejection fraction 20 to 25%, obstructive sleep apnea, hypertension, history of CVA, GERD, coronary disease status post CABG who returns now with shortness of breath and acute exacerbation of chronic systolic heart failure.   Chronic kidney disease stage IV.  Baseline creatinine 2.4 with GFR 27 on 02/19/2024.  Patient followed by Dr. Marcelino outpatient.  Described outpatient diabetics, reports compliance.  Renal function remains at baseline  Lab Results  Component Value Date   CREATININE 2.45 (H) 03/21/2024   CREATININE 2.48 (H) 03/21/2024   CREATININE 2.55 (H) 03/21/2024    Intake/Output Summary (Last 24 hours) at 03/21/2024 1303 Last data filed at 03/21/2024 0959 Gross  per 24 hour  Intake 654.88 ml  Output 1550 ml  Net -895.12 ml   2.  Acute on chronic systolic heart failure.  Recent outpatient echo completed on 03/14/2024 shows EF 25%.  IV furosemide  at 8 mg/h.  Continue metolazone  2.5 mg 3 times weekly.    3. Anemia of chronic kidney disease Lab Results  Component Value Date   HGB 11.0 (L) 03/20/2024    Hemoglobin within optimal range.  No need for ESA's at this time.  4. Diabetes mellitus type II  with chronic kidney disease/renal manifestations: noninsulin dependent. Most recent hemoglobin A1c is 7.1 on 01/19/2024.     LOS: 2 Dagen Beevers 11/21/20251:03 PM

## 2024-03-22 DIAGNOSIS — R601 Generalized edema: Secondary | ICD-10-CM | POA: Diagnosis not present

## 2024-03-22 DIAGNOSIS — I509 Heart failure, unspecified: Secondary | ICD-10-CM | POA: Diagnosis not present

## 2024-03-22 DIAGNOSIS — I5043 Acute on chronic combined systolic (congestive) and diastolic (congestive) heart failure: Secondary | ICD-10-CM | POA: Diagnosis not present

## 2024-03-22 DIAGNOSIS — N1832 Chronic kidney disease, stage 3b: Secondary | ICD-10-CM | POA: Diagnosis not present

## 2024-03-22 DIAGNOSIS — Z515 Encounter for palliative care: Secondary | ICD-10-CM | POA: Diagnosis not present

## 2024-03-22 LAB — MAGNESIUM: Magnesium: 2.2 mg/dL (ref 1.7–2.4)

## 2024-03-22 LAB — BASIC METABOLIC PANEL WITH GFR
Anion gap: 12 (ref 5–15)
BUN: 48 mg/dL — ABNORMAL HIGH (ref 8–23)
CO2: 29 mmol/L (ref 22–32)
Calcium: 8.9 mg/dL (ref 8.9–10.3)
Chloride: 85 mmol/L — ABNORMAL LOW (ref 98–111)
Creatinine, Ser: 2.39 mg/dL — ABNORMAL HIGH (ref 0.61–1.24)
GFR, Estimated: 28 mL/min — ABNORMAL LOW (ref >60)
Glucose, Bld: 114 mg/dL — ABNORMAL HIGH (ref 70–99)
Potassium: 3.7 mmol/L (ref 3.5–5.1)
Sodium: 126 mmol/L — ABNORMAL LOW (ref 135–145)

## 2024-03-22 LAB — GLUCOSE, CAPILLARY
Glucose-Capillary: 133 mg/dL — ABNORMAL HIGH (ref 70–99)
Glucose-Capillary: 211 mg/dL — ABNORMAL HIGH (ref 70–99)
Glucose-Capillary: 186 mg/dL — ABNORMAL HIGH (ref 70–99)
Glucose-Capillary: 142 mg/dL — ABNORMAL HIGH (ref 70–99)

## 2024-03-22 MED ORDER — MIDODRINE HCL 5 MG PO TABS
10.0000 mg | ORAL_TABLET | Freq: Three times a day (TID) | ORAL | Status: DC
  Administered 2024-03-22 – 2024-03-24 (×7): 10 mg via ORAL
  Filled 2024-03-22 (×7): qty 2

## 2024-03-22 MED ORDER — SPIRONOLACTONE 25 MG PO TABS
25.0000 mg | ORAL_TABLET | Freq: Every day | ORAL | Status: DC
  Administered 2024-03-22 – 2024-03-24 (×3): 25 mg via ORAL
  Filled 2024-03-22 (×4): qty 1

## 2024-03-22 NOTE — Progress Notes (Signed)
 Daily Progress Note   Patient Name: Jermaine Kramer       Date: 03/22/2024 DOB: 03/11/1951  Age: 73 y.o. MRN#: [id number] Attending Physician: Kandis Devaughn Sayres, MD Primary Care Physician: Perri Constance Sor, PA-C Admit Date: 03/19/2024  Reason for Consultation/Follow-up: Establishing goals of care  HPI/Brief Hospital Review:  73 y.o. male  with past medical history of CAD s/p CABG, HFrEF-most recent EF 30%, ICD with pacemaker placement, A-fib, type 2 diabetes, hypertension, hyperlipidemia, CKD stage IV with positive ANA and anemia admitted on 03/19/2024 from nephrology office with anasarca and dyspnea.   Reportedly Jermaine Kramer had been retaining fluid over the last several months.  He reports greater than 10 pound weight gain and had noticed decreased urine output.  He has been seen in ED several times for similar complaints and treatments were provided such as IV Lasix .   On arrival to ED labs significant for creatinine of 2.8 with baseline around 2, proBNP elevated greater than 19,000 and chest x-ray revealing bilateral pleural effusions and possible concern for ascites--admitted and being treated for HFrEF with acute exacerbation requiring continuous Lasix  drip--cardiology and nephrology following closely   Palliative medicine was consulted for assisting with goals of care conversations.  Subjective: Extensive chart review has been completed prior to meeting patient including labs, vital signs, imaging, progress notes, orders, and available advanced directive documents from current and previous encounters.    Visited with Jermaine Kramer at his bedside.  He is awake, alert and able to engage in conversations.  He is sitting up in recliner and reports feeling well today without issues and ambulating  and transferring from bed to chair.  He shares yesterday he was able to ambulate a few feet with assistance.  Wife-Donna at bedside during time of visit.  Jermaine Kramer does not engage much in conversation.  Arland shares her frustrations with primary team involving palliative care.  Reviewed role of palliative care as being specialized medical care for people living with a serious illness.  Our focus is on improving quality of life for both the patient and family.  Discussed in the hospital setting we ensure patients and family have a complete understanding of medical conditions as well as assisting with setting realistic goals and having goals of care conversations.  Discussed the difference between palliative medicine and  hospice care.  Brief review of hospitalization discussed as well as most recent medical updates.  We discussed in detail natural disease trajectory and expectations at end-of-life were discussed.  Encouraged conversations begin surrounding CODE STATUS, we discussed the difference between full code and do not resuscitate. Arland shares Jermaine Kramer has been clear for some time that he is accepting of all resuscitative efforts.  During our goals of care conversation Jermaine Kramer again becomes tearful.  Arland shares she wishes for conversations to be continued after hospital discharge and Jermaine Kramer and herself are able to follow-up with his heart failure team as well as nephrology team.  Answered and addressed all questions and concerns.  PMT to step away from daily visits as Jermaine Kramer not in a place to have goals of care conversations.  PMT will remain available peripherally, please reengage as needs or concerns arise.  Objective:  Physical Exam Constitutional:      General: He is not in acute distress.    Appearance: He is obese. He is ill-appearing.  HENT:     Head: Normocephalic.     Mouth/Throat:     Mouth: Mucous membranes are moist.  Pulmonary:     Effort: Pulmonary effort is  normal. No respiratory distress.  Abdominal:     General: There is no distension.  Skin:    General: Skin is warm and dry.  Neurological:     Mental Status: He is alert and oriented to person, place, and time.     Motor: Weakness present.  Psychiatric:        Mood and Affect: Mood normal.        Behavior: Behavior normal.        Thought Content: Thought content normal.             Vital Signs: BP 102/66 (BP Location: Left Arm)   Pulse 78   Temp 98.1 F (36.7 C) (Oral)   Resp 16   Ht 5' 5 (1.651 m)   Wt 86 kg   SpO2 98%   BMI 31.55 kg/m  SpO2: SpO2: 98 % O2 Device: O2 Device: Nasal Cannula O2 Flow Rate: O2 Flow Rate (L/min): 2 L/min   Palliative Care Assessment & Plan   Assessment/Recommendation/Plan  Continue with current plan of care PMT to step away from daily visits as goals are clear and Jermaine Kramer not in a place to continue goals of care conversations  Thank you for allowing the Palliative Medicine Team to assist in the care of this patient.  Visit includes: Detailed review of medical records (labs, imaging, vital signs), medically appropriate exam (mental status, respiratory, cardiac, skin), discussed with treatment team, counseling and educating patient, family and staff, documenting clinical information, medication management and coordination of care.  Waddell Lesches, DNP, AGNP-C Palliative Medicine   Please contact Palliative Medicine Team phone at [phone] for questions and concerns.

## 2024-03-22 NOTE — Progress Notes (Signed)
 Mobility Specialist Progress Note:    03/22/24 1152  Mobility  Activity Ambulated with assistance  Level of Assistance Modified independent, requires aide device or extra time  Assistive Device  (IV pole)  Distance Ambulated (ft) 160 ft  Range of Motion/Exercises Active;All extremities  Activity Response Tolerated well  Mobility visit 1 Mobility  Mobility Specialist Start Time (ACUTE ONLY) 1137  Mobility Specialist Stop Time (ACUTE ONLY) 1151  Mobility Specialist Time Calculation (min) (ACUTE ONLY) 14 min   Pt received in chair requesting assistance to ambulate. ModI to stand and ambulate utilizing IV pole for balance. Tolerated well, SpO2 94% on 2L after session. Left pt fowlers in bed, wife at bedside. All needs met.  Jermaine Kramer Mobility Specialist Please contact via Special Educational Needs Teacher or  Rehab office at [phone]

## 2024-03-22 NOTE — Care Management Important Message (Signed)
 Important Message  Patient Details  Name: Jermaine Kramer MRN: [id number] Date of Birth: 16-Oct-1950   Important Message Given:  Yes - Medicare IM     Jermaine Kramer 03/22/2024, 12:03 PM

## 2024-03-22 NOTE — Progress Notes (Addendum)
 [facility]  ROUNDING NOTE   Subjective:  Patient seen and evaluated at chairside. Wife in room. Patient reports good urine out put with excess overflowing overnight. Denies pain or discomfort. Acknowledges plan to continue with diuretic.  Objective:  Vital signs in last 24 hours:  Temp:  [97.5 F (36.4 C)-98.6 F (37 C)] 98.1 F (36.7 C) (11/22 1137) Pulse Rate:  [67-93] 78 (11/22 1137) Resp:  [16-19] 16 (11/22 1137) BP: (80-164)/(53-130) 102/66 (11/22 1137) SpO2:  [89 %-100 %] 98 % (11/22 1137) Weight:  [86 kg-88.2 kg] 86 kg (11/22 0858)  Weight change: -1 kg Filed Weights   03/21/24 0435 03/22/24 0500 03/22/24 0858  Weight: 89.2 kg 88.2 kg 86 kg    Intake/Output: I/O last 3 completed shifts: In: 1195.9 [P.O.:1040; I.V.:155.9] Out: 2400 [Urine:2400]   Intake/Output this shift:  Total I/O In: 120 [P.O.:120] Out: -   Physical Exam: General: NAD, sitting up in chair  Head: Normocephalic  Eyes: Anicteric  Lungs:  Clear, 2L [place]  Heart: Murmur, mild regurgitation  Abdomen:  Distended, soft  Extremities: 3+ peripheral edema.  Neurologic: Awake, alert, conversant  Skin: Warm,dry, no rash  Access: None    Basic Metabolic Panel: Recent Labs  Lab 03/19/24 1038 03/19/24 1918 03/20/24 0803 03/20/24 1258 03/20/24 1753 03/21/24 0030 03/21/24 0340 03/21/24 0640 03/22/24 0421  NA 124*   < > 123* 123* 122* 125* 125* 125* 126*  K 4.4   < > 4.2 4.0 4.2 4.0 3.7 3.6 3.7  CL 87*   < > 87* 85* 85* 85* 86* 87* 85*  CO2 26   < > 25 27 25 26 26 27 29   GLUCOSE 159*   < > 129* 231* 194* 149* 132* 135* 114*  BUN 53*   < > 50* 51* 48* 49* 49* 50* 48*  CREATININE 2.85*   < > 2.51* 2.54* 2.58* 2.55* 2.48* 2.45* 2.39*  CALCIUM  8.8*   < > 8.8* 8.8* 8.7* 8.8* 8.6* 8.8* 8.9  MG 1.7  --   --   --   --   --  2.4  --  2.2  PHOS 3.8   < > 3.8 3.9 4.2 4.4  --  4.4  --    < > = values in this interval not displayed.    Liver Function Tests: Recent Labs  Lab  03/19/24 1038 03/19/24 1918 03/20/24 0803 03/20/24 1258 03/20/24 1753 03/21/24 0030 03/21/24 0640  AST 17  --   --   --   --   --   --   ALT 8  --   --   --   --   --   --   ALKPHOS 76  --   --   --   --   --   --   BILITOT 0.8  --   --   --   --   --   --   PROT 6.2*  --   --   --   --   --   --   ALBUMIN  3.9   < > 3.8 3.9 3.8 3.8 3.8   < > = values in this interval not displayed.   No results for input(s): LIPASE, AMYLASE in the last 168 hours. No results for input(s): AMMONIA in the last 168 hours.  CBC: Recent Labs  Lab 03/19/24 1038 03/20/24 0803  WBC 6.0 5.6  HGB 11.2* 11.0*  HCT 34.6* 32.9*  MCV 76.9* 74.4*  PLT 177 166  Cardiac Enzymes: No results for input(s): CKTOTAL, CKMB, CKMBINDEX, TROPONINI in the last 168 hours.  BNP: Invalid input(s): POCBNP  CBG: Recent Labs  Lab 03/21/24 1158 03/21/24 1628 03/21/24 2104 03/22/24 0817 03/22/24 1136  GLUCAP 204* 185* 141* 133* 211*    Microbiology: Results for orders placed or performed during the hospital encounter of 01/19/24  Resp panel by RT-PCR (RSV, Flu A&B, Covid) Anterior Nasal Swab     Status: None   Collection Time: 01/19/24  7:22 AM   Specimen: Anterior Nasal Swab  Result Value Ref Range Status   SARS Coronavirus 2 by RT PCR NEGATIVE NEGATIVE Final    Comment: (NOTE) SARS-CoV-2 target nucleic acids are NOT DETECTED.  The SARS-CoV-2 RNA is generally detectable in upper respiratory specimens during the acute phase of infection. The lowest concentration of SARS-CoV-2 viral copies this assay can detect is 138 copies/mL. A negative result does not preclude SARS-Cov-2 infection and should not be used as the sole basis for treatment or other patient management decisions. A negative result may occur with  improper specimen collection/handling, submission of specimen other than nasopharyngeal swab, presence of viral mutation(s) within the areas targeted by this assay, and inadequate  number of viral copies(<138 copies/mL). A negative result must be combined with clinical observations, patient history, and epidemiological information. The expected result is Negative.  Fact Sheet for Patients:  bloggercourse.com  Fact Sheet for Healthcare Providers:  seriousbroker.it  This test is no t yet approved or cleared by the [place]  FDA and  has been authorized for detection and/or diagnosis of SARS-CoV-2 by FDA under an Emergency Use Authorization (EUA). This EUA will remain  in effect (meaning this test can be used) for the duration of the COVID-19 declaration under Section 564(b)(1) of the Act, 21 U.S.C.section 360bbb-3(b)(1), unless the authorization is terminated  or revoked sooner.       Influenza A by PCR NEGATIVE NEGATIVE Final   Influenza B by PCR NEGATIVE NEGATIVE Final    Comment: (NOTE) The Xpert Xpress SARS-CoV-2/FLU/RSV plus assay is intended as an aid in the diagnosis of influenza from Nasopharyngeal swab specimens and should not be used as a sole basis for treatment. Nasal washings and aspirates are unacceptable for Xpert Xpress SARS-CoV-2/FLU/RSV testing.  Fact Sheet for Patients: bloggercourse.com  Fact Sheet for Healthcare Providers: seriousbroker.it  This test is not yet approved or cleared by the [place]  FDA and has been authorized for detection and/or diagnosis of SARS-CoV-2 by FDA under an Emergency Use Authorization (EUA). This EUA will remain in effect (meaning this test can be used) for the duration of the COVID-19 declaration under Section 564(b)(1) of the Act, 21 U.S.C. section 360bbb-3(b)(1), unless the authorization is terminated or revoked.     Resp Syncytial Virus by PCR NEGATIVE NEGATIVE Final    Comment: (NOTE) Fact Sheet for Patients: bloggercourse.com  Fact Sheet for Healthcare  Providers: seriousbroker.it  This test is not yet approved or cleared by the [place]  FDA and has been authorized for detection and/or diagnosis of SARS-CoV-2 by FDA under an Emergency Use Authorization (EUA). This EUA will remain in effect (meaning this test can be used) for the duration of the COVID-19 declaration under Section 564(b)(1) of the Act, 21 U.S.C. section 360bbb-3(b)(1), unless the authorization is terminated or revoked.  Performed at [facility], [address] [place], [place] [address]     Coagulation Studies: No results for input(s): LABPROT, INR in the last 72 hours.  Urinalysis: No results for  input(s): COLORURINE, LABSPEC, PHURINE, GLUCOSEU, HGBUR, BILIRUBINUR, KETONESUR, PROTEINUR, UROBILINOGEN, NITRITE, LEUKOCYTESUR in the last 72 hours.  Invalid input(s): APPERANCEUR    Imaging: No results found.    Medications:    furosemide  (LASIX ) 200 mg in dextrose  5 % 100 mL (2 mg/mL) infusion 8 mg/hr (03/22/24 0411)    apixaban   5 mg Oral BID   atorvastatin   80 mg Oral Daily   calcitRIOL   0.25 mcg Oral Daily   insulin  aspart  0-5 Units Subcutaneous QHS   insulin  aspart  0-9 Units Subcutaneous TID WC   metolazone   2.5 mg Oral Once per day on Monday Wednesday Friday   midodrine   10 mg Oral TID WC   pantoprazole   40 mg Oral Daily   spironolactone   25 mg Oral Daily   tamsulosin   0.4 mg Oral QPC supper   acetaminophen  **OR** acetaminophen , albuterol , melatonin, methocarbamol , ondansetron , senna-docusate  Assessment/ Plan:  Jermaine Kramer is a 72 y.o.  male with past medical history of atrial fibrillation, hyperlipidemia, diabetes mellitus type 2 with chronic kidney disease, chronic systolic heart failure ejection fraction 20 to 25%, obstructive sleep apnea, hypertension, history of CVA, GERD, coronary disease status post CABG who returns now with shortness of breath and acute exacerbation  of chronic systolic heart failure.   Chronic kidney disease stage IV.  Baseline creatinine 2.4 with GFR 27 on 02/19/2024.  Patient followed by Dr. Marcelino outpatient.  Described outpatient diabetics, reports compliance.  Renal function slightly improved  Lab Results  Component Value Date   CREATININE 2.39 (H) 03/22/2024   CREATININE 2.45 (H) 03/21/2024   CREATININE 2.48 (H) 03/21/2024    Intake/Output Summary (Last 24 hours) at 03/22/2024 1312 Last data filed at 03/22/2024 0900 Gross per 24 hour  Intake 661.03 ml  Output 850 ml  Net -188.97 ml   2.  Acute on chronic systolic heart failure.  Recent outpatient echo completed on 03/14/2024 shows EF 25%.  IV furosemide  at 8 mg/h.  Continuing metolazone  2.5 mg 3 times weekly. Reports of hypotension overnight. Midodrine  increased.    3. Anemia of chronic kidney disease Lab Results  Component Value Date   HGB 11.0 (L) 03/20/2024    Hemoglobin within optimal range.  No need for ESA's at this time.  4. Diabetes mellitus type II with chronic kidney disease/renal manifestations: noninsulin dependent. Most recent hemoglobin A1c is 7.1 on 01/19/2024.   5. Hyponatremia   Likely in the setting of fluid overload. Sodium improving with diureses.    LOS: 3 Todrick Siedschlag SHAUNNA Dines 11/22/20251:12 PM

## 2024-03-22 NOTE — Plan of Care (Signed)

## 2024-03-22 NOTE — Progress Notes (Signed)
 PROGRESS NOTE    Jermaine Kramer  FMW:[id number] DOB: [birth year] DOA: 03/19/2024 PCP: Perri Constance Sor, PA-C  Outpatient Specialists: cardiology, nephrology    Brief Narrative:   From admission h and p  Jermaine Kramer is a 73 y.o. year old male with medical history of hypertension, hyperlipidemia c/b by CAD status post CABG, type 2 diabetes, HFrEF (EF 25% in 2024) status post ICD with pacemaker, atrial fibrillation presenting to the ED from his nephrology office for anasarca and complaints of weight gain and dyspnea.     Pt states he states he has been retaining fluid over the last few months. Pt reports urinating less and less over this time period. He has tried outpatient IV lasix  device at home about 7 weeks ago. He is taking his medications very regularly. Baseline weight is 180 lb. He is having some coughing with some sputum production. Denies any fevers, or chills.      On arrival to the ED patient was noted to be HDS stable.  Lab work and imaging obtained.  CBC with no leukocytosis, mild anemia at baseline.  BMP with hyponatremia at 124 with hypochloremia, AKI on CKD with creatinine at 2.8 with baseline around 2.  proBNP elevated at 19.4k.  Chest x-ray with trace bilateral pleural effusions, right upper quadrant obtained by EDP given concern for ascites and pending.  Given need for continued care, [facility] contacted for admission.  Assessment & Plan:   Principal Problem:   Acute exacerbation of CHF (congestive heart failure) (HCC) Active Problems:   GERD (gastroesophageal reflux disease)   Hypertension   HFrEF (heart failure with reduced ejection fraction) (HCC)   CAD s/p CABG x 3   Chronic combined systolic and diastolic congestive heart failure (HCC)   Atrial fibrillation, chronic (HCC)   Obesity (BMI 30.0-34.9)   BPH (benign prostatic hyperplasia)   DM2 (diabetes mellitus, type 2) (HCC)   CKD stage 3b, GFR 30-44 ml/min (HCC)  # HFrEF with acute exacerbation # ICD  status 3rd hospitalization in as many months, significant edema mainly in abdomen, small pleural effusions as well. EF 25% on recent TTE. Diuresing, out 1.5 last 24. - continue lasix  gtt, home metolazone , spiro - BB held 2/2 hypotension - strict I/Os, fluid restrict 1.2 liters - palliative following. Full scope for now   # Hyponatremia Likely 2/2 chf exacerbation. 122 on arrival, slowly improving, 126 today - diurese as above  # AKI on ckd 3b Baseline cr of around 2 here 2.5, mild improvement today - monitor while diuresing - nephrology following  # CAD s/p CABG x3 No chest pain - home apixaban   # A-fib Rate controlled - cont metop - cont home apixaban   # Hypotension, chronic - home midodrine   # BPH - home flomax   # T2DM Euglycemic - SSI - hold metformin  given worsening kidney function   DVT prophylaxis: apixaban  Code Status: full Family Communication: wife at bedside 11/22  Level of care: Progressive Status is: Inpatient Remains inpatient appropriate because: need for IV diuresis    Consultants:  Cardiology nephrology  Procedures: none  Antimicrobials:  none    Subjective: Reports feeling stable, dyspnea and swelling somewhat improved. Tolerating diet  Objective: Vitals:   03/22/24 0500 03/22/24 0759 03/22/24 0858 03/22/24 1137  BP:  100/65  102/66  Pulse:  71  78  Resp:  16  16  Temp:  97.6 F (36.4 C)  98.1 F (36.7 C)  TempSrc:  Oral  Oral  SpO2:  100%  98%  Weight: 88.2 kg  86 kg   Height:        Intake/Output Summary (Last 24 hours) at 03/22/2024 1200 Last data filed at 03/22/2024 0900 Gross per 24 hour  Intake 661.03 ml  Output 850 ml  Net -188.97 ml   Filed Weights   03/21/24 0435 03/22/24 0500 03/22/24 0858  Weight: 89.2 kg 88.2 kg 86 kg    Examination:  General exam: Appears calm and comfortable  Respiratory system: Clear to auscultation save for rales at bases Cardiovascular system: distant heart  sounds Gastrointestinal system: Abdomen is severely distended, not  tender Central nervous system: Alert and oriented. No focal neurological deficits. Extremities: Symmetric 5 x 5 power. Trace LE edema Skin: No rashes, lesions or ulcers Psychiatry: Judgement and insight appear normal. Mood & affect appropriate.     Data Reviewed: I have personally reviewed following labs and imaging studies  CBC: Recent Labs  Lab 03/19/24 1038 03/20/24 0803  WBC 6.0 5.6  HGB 11.2* 11.0*  HCT 34.6* 32.9*  MCV 76.9* 74.4*  PLT 177 166   Basic Metabolic Panel: Recent Labs  Lab 03/19/24 1038 03/19/24 1918 03/20/24 0803 03/20/24 1258 03/20/24 1753 03/21/24 0030 03/21/24 0340 03/21/24 0640 03/22/24 0421  NA 124*   < > 123* 123* 122* 125* 125* 125* 126*  K 4.4   < > 4.2 4.0 4.2 4.0 3.7 3.6 3.7  CL 87*   < > 87* 85* 85* 85* 86* 87* 85*  CO2 26   < > 25 27 25 26 26 27 29   GLUCOSE 159*   < > 129* 231* 194* 149* 132* 135* 114*  BUN 53*   < > 50* 51* 48* 49* 49* 50* 48*  CREATININE 2.85*   < > 2.51* 2.54* 2.58* 2.55* 2.48* 2.45* 2.39*  CALCIUM  8.8*   < > 8.8* 8.8* 8.7* 8.8* 8.6* 8.8* 8.9  MG 1.7  --   --   --   --   --  2.4  --  2.2  PHOS 3.8   < > 3.8 3.9 4.2 4.4  --  4.4  --    < > = values in this interval not displayed.   GFR: Estimated Creatinine Clearance: 26.9 mL/min (A) (by C-G formula based on SCr of 2.39 mg/dL (H)). Liver Function Tests: Recent Labs  Lab 03/19/24 1038 03/19/24 1918 03/20/24 0803 03/20/24 1258 03/20/24 1753 03/21/24 0030 03/21/24 0640  AST 17  --   --   --   --   --   --   ALT 8  --   --   --   --   --   --   ALKPHOS 76  --   --   --   --   --   --   BILITOT 0.8  --   --   --   --   --   --   PROT 6.2*  --   --   --   --   --   --   ALBUMIN  3.9   < > 3.8 3.9 3.8 3.8 3.8   < > = values in this interval not displayed.   No results for input(s): LIPASE, AMYLASE in the last 168 hours. No results for input(s): AMMONIA in the last 168  hours. Coagulation Profile: No results for input(s): INR, PROTIME in the last 168 hours. Cardiac Enzymes: No results for input(s): CKTOTAL, CKMB, CKMBINDEX, TROPONINI in the last 168 hours. BNP (last 3 results)  Recent Labs    03/19/24 1038  PROBNP 19,467.0*   HbA1C: No results for input(s): HGBA1C in the last 72 hours. CBG: Recent Labs  Lab 03/21/24 1158 03/21/24 1628 03/21/24 2104 03/22/24 0817 03/22/24 1136  GLUCAP 204* 185* 141* 133* 211*   Lipid Profile: No results for input(s): CHOL, HDL, LDLCALC, TRIG, CHOLHDL, LDLDIRECT in the last 72 hours. Thyroid Function Tests: No results for input(s): TSH, T4TOTAL, FREET4, T3FREE, THYROIDAB in the last 72 hours. Anemia Panel: No results for input(s): VITAMINB12, FOLATE, FERRITIN, TIBC, IRON, RETICCTPCT in the last 72 hours. Urine analysis:    Component Value Date/Time   COLORURINE YELLOW (A) 04/05/2023 1925   APPEARANCEUR CLEAR (A) 04/05/2023 1925   APPEARANCEUR CLEAR 01/15/2013 1659   LABSPEC 1.011 04/05/2023 1925   LABSPEC 1.005 01/15/2013 1659   PHURINE 5.0 04/05/2023 1925   GLUCOSEU NEGATIVE 04/05/2023 1925   GLUCOSEU NEGATIVE 01/15/2013 1659   HGBUR SMALL (A) 04/05/2023 1925   BILIRUBINUR NEGATIVE 04/05/2023 1925   BILIRUBINUR NEGATIVE 01/15/2013 1659   KETONESUR NEGATIVE 04/05/2023 1925   PROTEINUR NEGATIVE 04/05/2023 1925   NITRITE NEGATIVE 04/05/2023 1925   LEUKOCYTESUR TRACE (A) 04/05/2023 1925   LEUKOCYTESUR 1+ 01/15/2013 1659   Sepsis Labs: @LABRCNTIP (procalcitonin:4,lacticidven:4)  )No results found for this or any previous visit (from the past 240 hours).       Radiology Studies: No results found.       Scheduled Meds:  apixaban   5 mg Oral BID   atorvastatin   80 mg Oral Daily   calcitRIOL   0.25 mcg Oral Daily   insulin  aspart  0-5 Units Subcutaneous QHS   insulin  aspart  0-9 Units Subcutaneous TID WC   metolazone   2.5 mg Oral Once per day  on Monday Wednesday Friday   midodrine   10 mg Oral TID WC   pantoprazole   40 mg Oral Daily   spironolactone   25 mg Oral Daily   tamsulosin   0.4 mg Oral QPC supper   Continuous Infusions:  furosemide  (LASIX ) 200 mg in dextrose  5 % 100 mL (2 mg/mL) infusion 8 mg/hr (03/22/24 0411)     LOS: 3 days     Devaughn KATHEE Ban, MD [facility]   If 7PM-7AM, please contact night-coverage [email or web address] Password [email or web address] 03/22/2024, 12:00 PM

## 2024-03-22 NOTE — Progress Notes (Signed)
 SUBJECTIVE: Patient is still short of breath but has been according to his wife always short of breath since CABG.  He has no chest pain.  He states that he is still short of breath and feels no better even though he had 1.5 L of fluid removed.   Vitals:   03/22/24 0500 03/22/24 0759 03/22/24 0858 03/22/24 1137  BP:  100/65  102/66  Pulse:  71  78  Resp:  16  16  Temp:  97.6 F (36.4 C)  98.1 F (36.7 C)  TempSrc:  Oral  Oral  SpO2:  100%  98%  Weight: 88.2 kg  86 kg   Height:        Intake/Output Summary (Last 24 hours) at 03/22/2024 1347 Last data filed at 03/22/2024 0900 Gross per 24 hour  Intake 661.03 ml  Output 850 ml  Net -188.97 ml    LABS: Basic Metabolic Panel: Recent Labs    03/21/24 0030 03/21/24 0340 03/21/24 0640 03/22/24 0421  NA 125* 125* 125* 126*  K 4.0 3.7 3.6 3.7  CL 85* 86* 87* 85*  CO2 26 26 27 29   GLUCOSE 149* 132* 135* 114*  BUN 49* 49* 50* 48*  CREATININE 2.55* 2.48* 2.45* 2.39*  CALCIUM  8.8* 8.6* 8.8* 8.9  MG  --  2.4  --  2.2  PHOS 4.4  --  4.4  --    Liver Function Tests: Recent Labs    03/21/24 0030 03/21/24 0640  ALBUMIN  3.8 3.8   No results for input(s): LIPASE, AMYLASE in the last 72 hours. CBC: Recent Labs    03/20/24 0803  WBC 5.6  HGB 11.0*  HCT 32.9*  MCV 74.4*  PLT 166   Cardiac Enzymes: No results for input(s): CKTOTAL, CKMB, CKMBINDEX, TROPONINI in the last 72 hours. BNP: Invalid input(s): POCBNP D-Dimer: No results for input(s): DDIMER in the last 72 hours. Hemoglobin A1C: No results for input(s): HGBA1C in the last 72 hours. Fasting Lipid Panel: No results for input(s): CHOL, HDL, LDLCALC, TRIG, CHOLHDL, LDLDIRECT in the last 72 hours. Thyroid Function Tests: No results for input(s): TSH, T4TOTAL, T3FREE, THYROIDAB in the last 72 hours.  Invalid input(s): FREET3 Anemia Panel: No results for input(s): VITAMINB12, FOLATE, FERRITIN, TIBC, IRON,  RETICCTPCT in the last 72 hours.   PHYSICAL EXAM General: Well developed, well nourished, in no acute distress HEENT:  Normocephalic and atramatic Neck:  No JVD.  Lungs: Clear bilaterally to auscultation and percussion. Heart: HRRR . Normal S1 and S2 without gallops or murmurs.  Abdomen: Bowel sounds are positive, abdomen soft and non-tender  Msk:  Back normal, normal gait. Normal strength and tone for age. Extremities: No clubbing, cyanosis or edema.   Neuro: Alert and oriented X 3. Psych:  Good affect, responds appropriately  TELEMETRY: Sinus rhythm  ASSESSMENT AND PLAN:  Jermaine Kramer is a 73 y.o. male  with a past medical history of  CAD (s/p recent CABG with LIMA to LAD, SVG to ramus and SVG to PDA on 02/16/2021 with Dr. Leanor), postoperative atrial fibrillation (on amiodarone  and Eliquis , dilated cardiomyopathy with LVEF 20-25%, mod MR, 07/22/2021 s/p dual chamber ICD 06/22/21, history of frequent PVCs, type 2 diabetes, history of TIA, history of penile cancer, hypertension  who presented to the ED on 03/19/2024 for swelling. Cardiology was consulted for further evaluation.    # Acute on chronic HFrEF # Coronary artery disease s/p CABG 2022 # Ischemic cardiomyopathy s/p ICD # Chronic kidney disease stage IIIb #  Paroxysmal atrial fibrillation Presented with worsening LE edema and abdominal distention. BNP elevated at 19,000. Started on IV Lasix  infusion in the ED.  - Continue IV lasix  infusion, dosing per nephrology. Appreciate their recommendations.  - Can consider doses of metolazone  pending renal function. - Continue metoprolol  succinate 12.5 mg daily.  - Will defer resuming spironolactone  at this time.  Consider resuming during admission pending BP, renal function, volume status. - Continue Eliquis  5 mg bid.  - Continue atorvastatin  80 mg daily.        Principal Problem:   Acute exacerbation of CHF (congestive heart failure) (HCC) Active Problems:   GERD  (gastroesophageal reflux disease)   Hypertension   HFrEF (heart failure with reduced ejection fraction) (HCC)   CAD s/p CABG x 3   Chronic combined systolic and diastolic congestive heart failure (HCC)   Atrial fibrillation, chronic (HCC)   Obesity (BMI 30.0-34.9)   BPH (benign prostatic hyperplasia)   DM2 (diabetes mellitus, type 2) (HCC)   CKD stage 3b, GFR 30-44 ml/min (HCC)    Denyse Bathe, MD, FACC 03/22/2024 1:47 PM

## 2024-03-23 DIAGNOSIS — I509 Heart failure, unspecified: Secondary | ICD-10-CM | POA: Diagnosis not present

## 2024-03-23 DIAGNOSIS — I5043 Acute on chronic combined systolic (congestive) and diastolic (congestive) heart failure: Secondary | ICD-10-CM | POA: Diagnosis not present

## 2024-03-23 LAB — BASIC METABOLIC PANEL WITH GFR
Anion gap: 12 (ref 5–15)
BUN: 51 mg/dL — ABNORMAL HIGH (ref 8–23)
CO2: 30 mmol/L (ref 22–32)
Calcium: 8.8 mg/dL — ABNORMAL LOW (ref 8.9–10.3)
Chloride: 84 mmol/L — ABNORMAL LOW (ref 98–111)
Creatinine, Ser: 2.19 mg/dL — ABNORMAL HIGH (ref 0.61–1.24)
GFR, Estimated: 31 mL/min — ABNORMAL LOW (ref >60)
Glucose, Bld: 91 mg/dL (ref 70–99)
Potassium: 3.3 mmol/L — ABNORMAL LOW (ref 3.5–5.1)
Sodium: 127 mmol/L — ABNORMAL LOW (ref 135–145)

## 2024-03-23 LAB — GLUCOSE, CAPILLARY
Glucose-Capillary: 100 mg/dL — ABNORMAL HIGH (ref 70–99)
Glucose-Capillary: 212 mg/dL — ABNORMAL HIGH (ref 70–99)
Glucose-Capillary: 160 mg/dL — ABNORMAL HIGH (ref 70–99)
Glucose-Capillary: 178 mg/dL — ABNORMAL HIGH (ref 70–99)

## 2024-03-23 LAB — MAGNESIUM: Magnesium: 2.1 mg/dL (ref 1.7–2.4)

## 2024-03-23 LAB — VITAMIN B1: Vitamin B1 (Thiamine): 110.9 nmol/L (ref 66.5–200.0)

## 2024-03-23 MED ORDER — POLYETHYLENE GLYCOL 3350 17 G PO PACK
17.0000 g | PACK | Freq: Every day | ORAL | Status: DC
Start: 2024-03-23 — End: 2024-03-26
  Administered 2024-03-23 – 2024-03-24 (×2): 17 g via ORAL
  Filled 2024-03-23 (×2): qty 1

## 2024-03-23 MED ORDER — POTASSIUM CHLORIDE CRYS ER 20 MEQ PO TBCR
60.0000 meq | EXTENDED_RELEASE_TABLET | Freq: Once | ORAL | Status: AC
  Administered 2024-03-23: 60 meq via ORAL
  Filled 2024-03-23: qty 3

## 2024-03-23 NOTE — Progress Notes (Signed)
 SUBJECTIVE: Patient is feeling somewhat better with less shortness of breath.  Diuresing well with Lasix  drip.  Vitals:   03/23/24 0406 03/23/24 0410 03/23/24 0709 03/23/24 1107  BP: 99/75  95/66 92/67  Pulse: 67  67 81  Resp: 19  18 18   Temp: 97.6 F (36.4 C)  97.8 F (36.6 C) 97.9 F (36.6 C)  TempSrc:      SpO2: 100%  95% 98%  Weight:  86.4 kg    Height:        Intake/Output Summary (Last 24 hours) at 03/23/2024 1236 Last data filed at 03/23/2024 0844 Gross per 24 hour  Intake 100 ml  Output 2710 ml  Net -2610 ml    LABS: Basic Metabolic Panel: Recent Labs    03/21/24 0030 03/21/24 0340 03/21/24 0640 03/22/24 0421 03/23/24 0436  NA 125*   < > 125* 126* 127*  K 4.0   < > 3.6 3.7 3.3*  CL 85*   < > 87* 85* 84*  CO2 26   < > 27 29 30   GLUCOSE 149*   < > 135* 114* 91  BUN 49*   < > 50* 48* 51*  CREATININE 2.55*   < > 2.45* 2.39* 2.19*  CALCIUM  8.8*   < > 8.8* 8.9 8.8*  MG  --    < >  --  2.2 2.1  PHOS 4.4  --  4.4  --   --    < > = values in this interval not displayed.   Liver Function Tests: Recent Labs    03/21/24 0030 03/21/24 0640  ALBUMIN  3.8 3.8   No results for input(s): LIPASE, AMYLASE in the last 72 hours. CBC: No results for input(s): WBC, NEUTROABS, HGB, HCT, MCV, PLT in the last 72 hours. Cardiac Enzymes: No results for input(s): CKTOTAL, CKMB, CKMBINDEX, TROPONINI in the last 72 hours. BNP: Invalid input(s): POCBNP D-Dimer: No results for input(s): DDIMER in the last 72 hours. Hemoglobin A1C: No results for input(s): HGBA1C in the last 72 hours. Fasting Lipid Panel: No results for input(s): CHOL, HDL, LDLCALC, TRIG, CHOLHDL, LDLDIRECT in the last 72 hours. Thyroid Function Tests: No results for input(s): TSH, T4TOTAL, T3FREE, THYROIDAB in the last 72 hours.  Invalid input(s): FREET3 Anemia Panel: No results for input(s): VITAMINB12, FOLATE, FERRITIN, TIBC, IRON,  RETICCTPCT in the last 72 hours.   PHYSICAL EXAM General: Well developed, well nourished, in no acute distress HEENT:  Normocephalic and atramatic Neck:  No JVD.  Lungs: Clear bilaterally to auscultation and percussion. Heart: HRRR . Normal S1 and S2 without gallops or murmurs.  Abdomen: Bowel sounds are positive, abdomen soft and non-tender  Msk:  Back normal, normal gait. Normal strength and tone for age. Extremities: No clubbing, cyanosis or edema.   Neuro: Alert and oriented X 3. Psych:  Good affect, responds appropriately  TELEMETRY: Sinus rhythm  ASSESSMENT AND PLAN: 73 y.o. male  with a past medical history of  CAD (s/p recent CABG with LIMA to LAD, SVG to ramus and SVG to PDA on 02/16/2021 with Dr. Leanor), postoperative atrial fibrillation (on amiodarone  and Eliquis , dilated cardiomyopathy with LVEF 20-25%, mod MR, 07/22/2021 s/p dual chamber ICD 06/22/21, history of frequent PVCs, type 2 diabetes, history of TIA, history of penile cancer, hypertension  who presented to the ED on 03/19/2024 for swelling. Cardiology was consulted for further evaluation.    # Acute on chronic HFrEF # Coronary artery disease s/p CABG 2022 # Ischemic cardiomyopathy s/p ICD #  Chronic kidney disease stage IIIb # Paroxysmal atrial fibrillation Presented with worsening LE edema and abdominal distention. BNP elevated at 19,000. Started on IV Lasix  infusion in the ED.  - Continue IV lasix  infusion, dosing per nephrology. Appreciate their recommendations.  - Can consider doses of metolazone  pending renal function. - Continue metoprolol  succinate 12.5 mg daily.  - Will defer resuming spironolactone  at this time.  Consider resuming during admission pending BP, renal function, volume status. - Continue Eliquis  5 mg bid.  - Continue atorvastatin  80 mg daily.    ICD-10-CM   1. Anasarca  R60.1     2. Acute congestive heart failure, unspecified heart failure type (HCC)  I50.9       Principal Problem:    Acute exacerbation of CHF (congestive heart failure) (HCC) Active Problems:   GERD (gastroesophageal reflux disease)   Hypertension   HFrEF (heart failure with reduced ejection fraction) (HCC)   CAD s/p CABG x 3   Chronic combined systolic and diastolic congestive heart failure (HCC)   Atrial fibrillation, chronic (HCC)   Obesity (BMI 30.0-34.9)   BPH (benign prostatic hyperplasia)   DM2 (diabetes mellitus, type 2) (HCC)   CKD stage 3b, GFR 30-44 ml/min (HCC)    Denyse Bathe, MD, FACC 03/23/2024 12:36 PM

## 2024-03-23 NOTE — Plan of Care (Addendum)
 Pt is alert oriented x 4. Pt RA at baseline, pt c/o shortness of breath at beginning of shift and requested breathing treatment. PRN melatonin given for sleep, effective. Pt placed on 2L nasal cannula while sleeping as O2 dropped to 70s but increased to 90s on 2L nasal cannula. Pt stated he has sleep apnea and is suppose to wear cpap at home but machine has not arrived. Pt also had 5 beats of vtach, pt denies pain, no distress noted. Dr. Cleatus informed. no new orders received. Stand by assist. Pt ambulated in room x 1. IV lasix  continued per order. Clear yellow urine output. No distress noted. Call button within reach.   Problem: Education: Goal: Knowledge of General Education information will improve Description: Including pain rating scale, medication(s)/side effects and non-pharmacologic comfort measures Outcome: Progressing   Problem: Health Behavior/Discharge Planning: Goal: Ability to manage health-related needs will improve Outcome: Progressing   Problem: Clinical Measurements: Goal: Ability to maintain clinical measurements within normal limits will improve Outcome: Progressing Goal: Will remain free from infection Outcome: Progressing Goal: Diagnostic test results will improve Outcome: Progressing Goal: Respiratory complications will improve Outcome: Progressing Goal: Cardiovascular complication will be avoided Outcome: Progressing   Problem: Activity: Goal: Risk for activity intolerance will decrease Outcome: Progressing   Problem: Nutrition: Goal: Adequate nutrition will be maintained Outcome: Progressing   Problem: Coping: Goal: Level of anxiety will decrease Outcome: Progressing   Problem: Elimination: Goal: Will not experience complications related to bowel motility Outcome: Progressing Goal: Will not experience complications related to urinary retention Outcome: Progressing   Problem: Pain Managment: Goal: General experience of comfort will improve and/or be  controlled Outcome: Progressing   Problem: Safety: Goal: Ability to remain free from injury will improve Outcome: Progressing   Problem: Skin Integrity: Goal: Risk for impaired skin integrity will decrease Outcome: Progressing   Problem: Education: Goal: Ability to describe self-care measures that may prevent or decrease complications (Diabetes Survival Skills Education) will improve Outcome: Progressing Goal: Individualized Educational Video(s) Outcome: Progressing   Problem: Coping: Goal: Ability to adjust to condition or change in health will improve Outcome: Progressing   Problem: Fluid Volume: Goal: Ability to maintain a balanced intake and output will improve Outcome: Progressing   Problem: Health Behavior/Discharge Planning: Goal: Ability to identify and utilize available resources and services will improve Outcome: Progressing Goal: Ability to manage health-related needs will improve Outcome: Progressing   Problem: Metabolic: Goal: Ability to maintain appropriate glucose levels will improve Outcome: Progressing   Problem: Nutritional: Goal: Maintenance of adequate nutrition will improve Outcome: Progressing Goal: Progress toward achieving an optimal weight will improve Outcome: Progressing   Problem: Skin Integrity: Goal: Risk for impaired skin integrity will decrease Outcome: Progressing   Problem: Tissue Perfusion: Goal: Adequacy of tissue perfusion will improve Outcome: Progressing

## 2024-03-23 NOTE — Progress Notes (Signed)
 [facility]  ROUNDING NOTE   Subjective:  No acute events overnight. Patient now weaned to room air. Wife at bedside reports bowel regiments at home is miralax  daily and no BM since previous day. Miralax  ordered. No other complaints.   Objective:  Vital signs in last 24 hours:  Temp:  [97.2 F (36.2 C)-97.9 F (36.6 C)] 97.9 F (36.6 C) (11/23 1107) Pulse Rate:  [67-81] 81 (11/23 1107) Resp:  [18-19] 18 (11/23 1107) BP: (85-105)/(63-77) 92/67 (11/23 1107) SpO2:  [94 %-100 %] 98 % (11/23 1107) Weight:  [86.4 kg] 86.4 kg (11/23 0410)  Weight change: -2.2 kg Filed Weights   03/22/24 0500 03/22/24 0858 03/23/24 0410  Weight: 88.2 kg 86 kg 86.4 kg    Intake/Output: I/O last 3 completed shifts: In: 321 [P.O.:220; I.V.:101] Out: 2600 [Urine:2600]   Intake/Output this shift:  Total I/O In: -  Out: 510 [Urine:510]  Physical Exam: General: NAD  Head: Normocephalic  Eyes: Anicteric  Lungs:  Clear, room air  Heart: Murmur, reg  Abdomen:  Distended  Extremities: +2/3 peripheral edema.  Neurologic: Awake, alert, conversant  Skin: Warm,dry, no rash  Access: None    Basic Metabolic Panel: Recent Labs  Lab 03/19/24 1038 03/19/24 1918 03/20/24 0803 03/20/24 1258 03/20/24 1753 03/21/24 0030 03/21/24 0340 03/21/24 0640 03/22/24 0421 03/23/24 0436  NA 124*   < > 123* 123* 122* 125* 125* 125* 126* 127*  K 4.4   < > 4.2 4.0 4.2 4.0 3.7 3.6 3.7 3.3*  CL 87*   < > 87* 85* 85* 85* 86* 87* 85* 84*  CO2 26   < > 25 27 25 26 26 27 29 30   GLUCOSE 159*   < > 129* 231* 194* 149* 132* 135* 114* 91  BUN 53*   < > 50* 51* 48* 49* 49* 50* 48* 51*  CREATININE 2.85*   < > 2.51* 2.54* 2.58* 2.55* 2.48* 2.45* 2.39* 2.19*  CALCIUM  8.8*   < > 8.8* 8.8* 8.7* 8.8* 8.6* 8.8* 8.9 8.8*  MG 1.7  --   --   --   --   --  2.4  --  2.2 2.1  PHOS 3.8   < > 3.8 3.9 4.2 4.4  --  4.4  --   --    < > = values in this interval not displayed.    Liver Function Tests: Recent Labs  Lab  03/19/24 1038 03/19/24 1918 03/20/24 0803 03/20/24 1258 03/20/24 1753 03/21/24 0030 03/21/24 0640  AST 17  --   --   --   --   --   --   ALT 8  --   --   --   --   --   --   ALKPHOS 76  --   --   --   --   --   --   BILITOT 0.8  --   --   --   --   --   --   PROT 6.2*  --   --   --   --   --   --   ALBUMIN  3.9   < > 3.8 3.9 3.8 3.8 3.8   < > = values in this interval not displayed.   No results for input(s): LIPASE, AMYLASE in the last 168 hours. No results for input(s): AMMONIA in the last 168 hours.  CBC: Recent Labs  Lab 03/19/24 1038 03/20/24 0803  WBC 6.0 5.6  HGB 11.2* 11.0*  HCT 34.6* 32.9*  MCV 76.9* 74.4*  PLT 177 166    Cardiac Enzymes: No results for input(s): CKTOTAL, CKMB, CKMBINDEX, TROPONINI in the last 168 hours.  BNP: Invalid input(s): POCBNP  CBG: Recent Labs  Lab 03/22/24 1136 03/22/24 1724 03/22/24 1951 03/23/24 0709 03/23/24 1203  GLUCAP 211* 186* 142* 100* 212*    Microbiology: Results for orders placed or performed during the hospital encounter of 01/19/24  Resp panel by RT-PCR (RSV, Flu A&B, Covid) Anterior Nasal Swab     Status: None   Collection Time: 01/19/24  7:22 AM   Specimen: Anterior Nasal Swab  Result Value Ref Range Status   SARS Coronavirus 2 by RT PCR NEGATIVE NEGATIVE Final    Comment: (NOTE) SARS-CoV-2 target nucleic acids are NOT DETECTED.  The SARS-CoV-2 RNA is generally detectable in upper respiratory specimens during the acute phase of infection. The lowest concentration of SARS-CoV-2 viral copies this assay can detect is 138 copies/mL. A negative result does not preclude SARS-Cov-2 infection and should not be used as the sole basis for treatment or other patient management decisions. A negative result may occur with  improper specimen collection/handling, submission of specimen other than nasopharyngeal swab, presence of viral mutation(s) within the areas targeted by this assay, and inadequate  number of viral copies(<138 copies/mL). A negative result must be combined with clinical observations, patient history, and epidemiological information. The expected result is Negative.  Fact Sheet for Patients:  bloggercourse.com  Fact Sheet for Healthcare Providers:  seriousbroker.it  This test is no t yet approved or cleared by the [place]  FDA and  has been authorized for detection and/or diagnosis of SARS-CoV-2 by FDA under an Emergency Use Authorization (EUA). This EUA will remain  in effect (meaning this test can be used) for the duration of the COVID-19 declaration under Section 564(b)(1) of the Act, 21 U.S.C.section 360bbb-3(b)(1), unless the authorization is terminated  or revoked sooner.       Influenza A by PCR NEGATIVE NEGATIVE Final   Influenza B by PCR NEGATIVE NEGATIVE Final    Comment: (NOTE) The Xpert Xpress SARS-CoV-2/FLU/RSV plus assay is intended as an aid in the diagnosis of influenza from Nasopharyngeal swab specimens and should not be used as a sole basis for treatment. Nasal washings and aspirates are unacceptable for Xpert Xpress SARS-CoV-2/FLU/RSV testing.  Fact Sheet for Patients: bloggercourse.com  Fact Sheet for Healthcare Providers: seriousbroker.it  This test is not yet approved or cleared by the [place]  FDA and has been authorized for detection and/or diagnosis of SARS-CoV-2 by FDA under an Emergency Use Authorization (EUA). This EUA will remain in effect (meaning this test can be used) for the duration of the COVID-19 declaration under Section 564(b)(1) of the Act, 21 U.S.C. section 360bbb-3(b)(1), unless the authorization is terminated or revoked.     Resp Syncytial Virus by PCR NEGATIVE NEGATIVE Final    Comment: (NOTE) Fact Sheet for Patients: bloggercourse.com  Fact Sheet for Healthcare  Providers: seriousbroker.it  This test is not yet approved or cleared by the [place]  FDA and has been authorized for detection and/or diagnosis of SARS-CoV-2 by FDA under an Emergency Use Authorization (EUA). This EUA will remain in effect (meaning this test can be used) for the duration of the COVID-19 declaration under Section 564(b)(1) of the Act, 21 U.S.C. section 360bbb-3(b)(1), unless the authorization is terminated or revoked.  Performed at [facility], [address], [place], [place] [address]     Coagulation Studies: No results  for input(s): LABPROT, INR in the last 72 hours.  Urinalysis: No results for input(s): COLORURINE, LABSPEC, PHURINE, GLUCOSEU, HGBUR, BILIRUBINUR, KETONESUR, PROTEINUR, UROBILINOGEN, NITRITE, LEUKOCYTESUR in the last 72 hours.  Invalid input(s): APPERANCEUR    Imaging: No results found.    Medications:    furosemide  (LASIX ) 200 mg in dextrose  5 % 100 mL (2 mg/mL) infusion 8 mg/hr (03/22/24 1541)    apixaban   5 mg Oral BID   atorvastatin   80 mg Oral Daily   calcitRIOL   0.25 mcg Oral Daily   insulin  aspart  0-5 Units Subcutaneous QHS   insulin  aspart  0-9 Units Subcutaneous TID WC   metolazone   2.5 mg Oral Once per day on Monday Wednesday Friday   midodrine   10 mg Oral TID WC   pantoprazole   40 mg Oral Daily   polyethylene glycol  17 g Oral Daily   potassium chloride   60 mEq Oral Once   spironolactone   25 mg Oral Daily   tamsulosin   0.4 mg Oral QPC supper   acetaminophen  **OR** acetaminophen , albuterol , melatonin, methocarbamol , ondansetron , senna-docusate  Assessment/ Plan:  Jermaine Kramer is a 73 y.o.  male with past medical history of atrial fibrillation, hyperlipidemia, diabetes mellitus type 2 with chronic kidney disease, chronic systolic heart failure ejection fraction 20 to 25%, obstructive sleep apnea, hypertension, history of CVA, GERD, coronary disease  status post CABG who returns now with shortness of breath and acute exacerbation of chronic systolic heart failure.   Chronic kidney disease stage IV.  Baseline creatinine 2.4 with GFR 27 on 02/19/2024.  Patient followed by Dr. Marcelino outpatient.  Described outpatient diabetics, reports compliance.  Renal function slightly improved  Lab Results  Component Value Date   CREATININE 2.19 (H) 03/23/2024   CREATININE 2.39 (H) 03/22/2024   CREATININE 2.45 (H) 03/21/2024    Intake/Output Summary (Last 24 hours) at 03/23/2024 1312 Last data filed at 03/23/2024 0844 Gross per 24 hour  Intake 100 ml  Output 2710 ml  Net -2610 ml   2.  Acute on chronic systolic heart failure.  Recent outpatient echo completed on 03/14/2024 shows EF 25%. Continuing IV furosemide  at 8 mg/h.  Continuing metolazone  2.5 mg 3 times weekly.    3. Anemia of chronic kidney disease Lab Results  Component Value Date   HGB 11.0 (L) 03/20/2024    Hemoglobin within optimal range.  No need for ESA's at this time.  4. Diabetes mellitus type II with chronic kidney disease/renal manifestations: noninsulin dependent. Most recent hemoglobin A1c is 7.1 on 01/19/2024.   5. Hyponatremia   Likely in the setting of fluid overload. Sodium continues to improve with diureses  6. Hypokalemia  K 3.3 Patient on spironolactone . Primary team replaced.  7. Constipation  Miralax  ordered   LOS: 4 Johntae Broxterman P Haelyn Forgey 11/23/20251:12 PM

## 2024-03-23 NOTE — Plan of Care (Signed)

## 2024-03-23 NOTE — Progress Notes (Signed)
 Mobility Specialist Progress Note:    03/23/24 1610  Mobility  Activity Ambulated with assistance  Level of Assistance Modified independent, requires aide device or extra time  Assistive Device  (IV pole)  Distance Ambulated (ft) 160 ft  Range of Motion/Exercises Active;All extremities  Activity Response Tolerated well  Mobility visit 1 Mobility  Mobility Specialist Start Time (ACUTE ONLY) 1553  Mobility Specialist Stop Time (ACUTE ONLY) 1610  Mobility Specialist Time Calculation (min) (ACUTE ONLY) 17 min   Pt received in chair, eager for mobility. ModI to stand and ambulate while utilizing IV pole. Tolerated well, SpO2 89-94% on RA throughout ambulation and SpO2 97% on RA after session.Left pt semi fowlers, alarm on and belongings in reach. All needs met.  Sherrilee Ditty Mobility Specialist Please contact via Special Educational Needs Teacher or  Rehab office at [phone]

## 2024-03-23 NOTE — Progress Notes (Addendum)
 PROGRESS NOTE    Jermaine Kramer  FMW:[id number] DOB: 23-Oct-1950 DOA: 03/19/2024 PCP: Perri Constance Sor, PA-C  Outpatient Specialists: cardiology, nephrology    Brief Narrative:   From admission h and p  Jermaine Kramer is a 73 y.o. year old male with medical history of hypertension, hyperlipidemia c/b by CAD status post CABG, type 2 diabetes, HFrEF (EF 25% in 2024) status post ICD with pacemaker, atrial fibrillation presenting to the ED from his nephrology office for anasarca and complaints of weight gain and dyspnea.     Pt states he states he has been retaining fluid over the last few months. Pt reports urinating less and less over this time period. He has tried outpatient IV lasix  device at home about 7 weeks ago. He is taking his medications very regularly. Baseline weight is 180 lb. He is having some coughing with some sputum production. Denies any fevers, or chills.      On arrival to the ED patient was noted to be HDS stable.  Lab work and imaging obtained.  CBC with no leukocytosis, mild anemia at baseline.  BMP with hyponatremia at 124 with hypochloremia, AKI on CKD with creatinine at 2.8 with baseline around 2.  proBNP elevated at 19.4k.  Chest x-ray with trace bilateral pleural effusions, right upper quadrant obtained by EDP given concern for ascites and pending.  Given need for continued care, [facility] contacted for admission.  Assessment & Plan:   Principal Problem:   Acute exacerbation of CHF (congestive heart failure) (HCC) Active Problems:   GERD (gastroesophageal reflux disease)   Hypertension   HFrEF (heart failure with reduced ejection fraction) (HCC)   CAD s/p CABG x 3   Chronic combined systolic and diastolic congestive heart failure (HCC)   Atrial fibrillation, chronic (HCC)   Obesity (BMI 30.0-34.9)   BPH (benign prostatic hyperplasia)   DM2 (diabetes mellitus, type 2) (HCC)   CKD stage 3b, GFR 30-44 ml/min (HCC)  # HFrEF with acute exacerbation # ICD  status 3rd hospitalization in as many months, significant edema mainly in abdomen, small pleural effusions as well. EF 25% on recent TTE. Diuresing, out 2.2 last 24 - continue lasix  gtt, home metolazone , spiro - BB held 2/2 hypotension, midodrine  added - strict I/Os, fluid restrict 1.2 liters - palliative following. Full scope for now   # Hyponatremia Likely 2/2 chf exacerbation. 122 on arrival, slowly improving, 127 today - diurese as above  # AKI on ckd 3b Baseline cr of around 2 here 2.5, slow improvement with diuresis, 2.19 today - monitor while diuresing - nephrology following  # CAD s/p CABG x3 No chest pain - home apixaban   # A-fib Rate controlled - metop on hold 2/2 hypotension - cont home apixaban   # Hypotension, chronic - home midodrine   # BPH - home flomax   # T2DM Euglycemic - SSI - hold metformin  given worsening kidney function   DVT prophylaxis: apixaban  Code Status: full Family Communication: wife at bedside 11/23  Level of care: Progressive Status is: Inpatient Remains inpatient appropriate because: need for IV diuresis    Consultants:  Cardiology nephrology  Procedures: none  Antimicrobials:  none    Subjective: Reports feeling stable, dyspnea and swelling somewhat improved. Tolerating diet.  Objective: Vitals:   03/23/24 0406 03/23/24 0410 03/23/24 0709 03/23/24 1107  BP: 99/75  95/66 92/67  Pulse: 67  67 81  Resp: 19  18 18   Temp: 97.6 F (36.4 C)  97.8 F (36.6 C) 97.9 F (36.6  C)  TempSrc:      SpO2: 100%  95% 98%  Weight:  86.4 kg    Height:        Intake/Output Summary (Last 24 hours) at 03/23/2024 1312 Last data filed at 03/23/2024 0844 Gross per 24 hour  Intake 100 ml  Output 2710 ml  Net -2610 ml   Filed Weights   03/22/24 0500 03/22/24 0858 03/23/24 0410  Weight: 88.2 kg 86 kg 86.4 kg    Examination:  General exam: Appears calm and comfortable  Respiratory system: Clear to auscultation save for rales  at bases Cardiovascular system: distant heart sounds Gastrointestinal system: Abdomen is severely distended, not  tender Central nervous system: Alert and oriented. No focal neurological deficits. Extremities: Symmetric 5 x 5 power. Trace LE edema Skin: No rashes, lesions or ulcers Psychiatry: Judgement and insight appear normal. Mood & affect appropriate.     Data Reviewed: I have personally reviewed following labs and imaging studies  CBC: Recent Labs  Lab 03/19/24 1038 03/20/24 0803  WBC 6.0 5.6  HGB 11.2* 11.0*  HCT 34.6* 32.9*  MCV 76.9* 74.4*  PLT 177 166   Basic Metabolic Panel: Recent Labs  Lab 03/19/24 1038 03/19/24 1918 03/20/24 0803 03/20/24 1258 03/20/24 1753 03/21/24 0030 03/21/24 0340 03/21/24 0640 03/22/24 0421 03/23/24 0436  NA 124*   < > 123* 123* 122* 125* 125* 125* 126* 127*  K 4.4   < > 4.2 4.0 4.2 4.0 3.7 3.6 3.7 3.3*  CL 87*   < > 87* 85* 85* 85* 86* 87* 85* 84*  CO2 26   < > 25 27 25 26 26 27 29 30   GLUCOSE 159*   < > 129* 231* 194* 149* 132* 135* 114* 91  BUN 53*   < > 50* 51* 48* 49* 49* 50* 48* 51*  CREATININE 2.85*   < > 2.51* 2.54* 2.58* 2.55* 2.48* 2.45* 2.39* 2.19*  CALCIUM  8.8*   < > 8.8* 8.8* 8.7* 8.8* 8.6* 8.8* 8.9 8.8*  MG 1.7  --   --   --   --   --  2.4  --  2.2 2.1  PHOS 3.8   < > 3.8 3.9 4.2 4.4  --  4.4  --   --    < > = values in this interval not displayed.   GFR: Estimated Creatinine Clearance: 29.5 mL/min (A) (by C-G formula based on SCr of 2.19 mg/dL (H)). Liver Function Tests: Recent Labs  Lab 03/19/24 1038 03/19/24 1918 03/20/24 0803 03/20/24 1258 03/20/24 1753 03/21/24 0030 03/21/24 0640  AST 17  --   --   --   --   --   --   ALT 8  --   --   --   --   --   --   ALKPHOS 76  --   --   --   --   --   --   BILITOT 0.8  --   --   --   --   --   --   PROT 6.2*  --   --   --   --   --   --   ALBUMIN  3.9   < > 3.8 3.9 3.8 3.8 3.8   < > = values in this interval not displayed.   No results for input(s):  LIPASE, AMYLASE in the last 168 hours. No results for input(s): AMMONIA in the last 168 hours. Coagulation Profile: No results for input(s): INR,  PROTIME in the last 168 hours. Cardiac Enzymes: No results for input(s): CKTOTAL, CKMB, CKMBINDEX, TROPONINI in the last 168 hours. BNP (last 3 results) Recent Labs    03/19/24 1038  PROBNP 19,467.0*   HbA1C: No results for input(s): HGBA1C in the last 72 hours. CBG: Recent Labs  Lab 03/22/24 1136 03/22/24 1724 03/22/24 1951 03/23/24 0709 03/23/24 1203  GLUCAP 211* 186* 142* 100* 212*   Lipid Profile: No results for input(s): CHOL, HDL, LDLCALC, TRIG, CHOLHDL, LDLDIRECT in the last 72 hours. Thyroid Function Tests: No results for input(s): TSH, T4TOTAL, FREET4, T3FREE, THYROIDAB in the last 72 hours. Anemia Panel: No results for input(s): VITAMINB12, FOLATE, FERRITIN, TIBC, IRON, RETICCTPCT in the last 72 hours. Urine analysis:    Component Value Date/Time   COLORURINE YELLOW (A) 04/05/2023 1925   APPEARANCEUR CLEAR (A) 04/05/2023 1925   APPEARANCEUR CLEAR 01/15/2013 1659   LABSPEC 1.011 04/05/2023 1925   LABSPEC 1.005 01/15/2013 1659   PHURINE 5.0 04/05/2023 1925   GLUCOSEU NEGATIVE 04/05/2023 1925   GLUCOSEU NEGATIVE 01/15/2013 1659   HGBUR SMALL (A) 04/05/2023 1925   BILIRUBINUR NEGATIVE 04/05/2023 1925   BILIRUBINUR NEGATIVE 01/15/2013 1659   KETONESUR NEGATIVE 04/05/2023 1925   PROTEINUR NEGATIVE 04/05/2023 1925   NITRITE NEGATIVE 04/05/2023 1925   LEUKOCYTESUR TRACE (A) 04/05/2023 1925   LEUKOCYTESUR 1+ 01/15/2013 1659   Sepsis Labs: @LABRCNTIP (procalcitonin:4,lacticidven:4)  )No results found for this or any previous visit (from the past 240 hours).       Radiology Studies: No results found.       Scheduled Meds:  apixaban   5 mg Oral BID   atorvastatin   80 mg Oral Daily   calcitRIOL   0.25 mcg Oral Daily   insulin  aspart  0-5 Units Subcutaneous  QHS   insulin  aspart  0-9 Units Subcutaneous TID WC   metolazone   2.5 mg Oral Once per day on Monday Wednesday Friday   midodrine   10 mg Oral TID WC   pantoprazole   40 mg Oral Daily   polyethylene glycol  17 g Oral Daily   potassium chloride   60 mEq Oral Once   spironolactone   25 mg Oral Daily   tamsulosin   0.4 mg Oral QPC supper   Continuous Infusions:  furosemide  (LASIX ) 200 mg in dextrose  5 % 100 mL (2 mg/mL) infusion 8 mg/hr (03/22/24 1541)     LOS: 4 days     Devaughn KATHEE Ban, MD [facility]   If 7PM-7AM, please contact night-coverage [email or web address] Password [email or web address] 03/23/2024, 1:12 PM

## 2024-03-24 LAB — BASIC METABOLIC PANEL WITH GFR
Anion gap: 10 (ref 5–15)
BUN: 48 mg/dL — ABNORMAL HIGH (ref 8–23)
CO2: 30 mmol/L (ref 22–32)
Calcium: 8.9 mg/dL (ref 8.9–10.3)
Chloride: 85 mmol/L — ABNORMAL LOW (ref 98–111)
Creatinine, Ser: 2.12 mg/dL — ABNORMAL HIGH (ref 0.61–1.24)
GFR, Estimated: 32 mL/min — ABNORMAL LOW (ref >60)
Glucose, Bld: 107 mg/dL — ABNORMAL HIGH (ref 70–99)
Potassium: 3.7 mmol/L (ref 3.5–5.1)
Sodium: 125 mmol/L — ABNORMAL LOW (ref 135–145)

## 2024-03-24 LAB — MAGNESIUM: Magnesium: 2.2 mg/dL (ref 1.7–2.4)

## 2024-03-24 LAB — GLUCOSE, CAPILLARY
Glucose-Capillary: 127 mg/dL — ABNORMAL HIGH (ref 70–99)
Glucose-Capillary: 208 mg/dL — ABNORMAL HIGH (ref 70–99)

## 2024-03-24 MED ORDER — TAMSULOSIN HCL 0.4 MG PO CAPS: 0.4000 mg | ORAL_CAPSULE | Freq: Every day | ORAL | Status: DC

## 2024-03-24 MED ORDER — BUMETANIDE 0.25 MG/ML IJ SOLN
2.0000 mg | Freq: Every day | INTRAMUSCULAR | Status: DC
  Administered 2024-03-24: 2 mg via INTRAVENOUS
  Filled 2024-03-24: qty 8

## 2024-03-24 MED ORDER — SPIRONOLACTONE 25 MG PO TABS: 25.0000 mg | ORAL_TABLET | Freq: Every day | ORAL | Status: DC

## 2024-03-24 MED ORDER — METOLAZONE 2.5 MG PO TABS: 2.5000 mg | ORAL_TABLET | ORAL | Status: DC

## 2024-03-24 MED ORDER — MIDODRINE HCL 5 MG PO TABS: 5.0000 mg | ORAL_TABLET | Freq: Three times a day (TID) | ORAL | 1 refills | Status: DC

## 2024-03-24 NOTE — TOC Transition Note (Signed)
 Transition of Care [facility]) - Discharge Note   Patient Details  Name: [name] MRN: [id number] Date of Birth: [birth year]  Transition of [facility]) CM/SW Contact:  Alfonso Rummer, LCSW Phone Number: 03/24/2024, 12:04 PM   Clinical Narrative:     Pt will discharge home with self care via family transport. Pt spouse at bedside. No further toc needs identified.   Final next level of care: Home/Self Care Barriers to Discharge: Barriers Resolved   Patient Goals and CMS Choice            Discharge Placement  Home with self care      Name of family member notified: Pt spouse at bedside Patient and family notified of of transfer: 03/24/24  Discharge Plan and Services Additional resources added to the After Visit Summary for       Post Acute Care Choice: NA                               Social Drivers of Health (SDOH) Interventions SDOH Screenings   Food Insecurity: No Food Insecurity (03/20/2024)  Housing: Low Risk  (03/20/2024)  Transportation Needs: No Transportation Needs (03/20/2024)  Utilities: Not At Risk (03/20/2024)  Depression (PHQ2-9): Low Risk  (01/31/2022)  Financial Resource Strain: Low Risk  (03/12/2024)   Received from [facility]  Social Connections: Moderately Integrated (03/20/2024)  Tobacco Use: Low Risk  (03/19/2024)  Recent Concern: Tobacco Use - Medium Risk (03/19/2024)   Received from [facility]     Readmission Risk Interventions    03/20/2024    2:33 PM 02/12/2024   12:56 PM 06/11/2022   11:05 AM  Readmission Risk Prevention Plan  Transportation Screening Complete Complete Complete  PCP or Specialist Appt within 3-5 Days  Complete   Social Work Consult for Recovery Care Planning/Counseling  Complete   Palliative Care Screening  Not Applicable   Medication Review Oceanographer) Complete Complete Complete  PCP or Specialist appointment within 3-5 days of discharge Complete  Complete  SW Recovery  Care/Counseling Consult Complete  Complete  Palliative Care Screening Not Applicable  Not Applicable  Skilled Nursing Facility Not Applicable  Not Applicable

## 2024-03-24 NOTE — Progress Notes (Addendum)
 [facility] CLINIC CARDIOLOGY PROGRESS NOTE       Patient ID: LATROY GAYMON MRN: [id number] DOB/AGE: Sep 11, 1950 73 y.o.  Admit date: 03/19/2024 Referring Physician Dr. Morene Bathe Primary Physician Perri, Mychal Burnard, PA-C  Primary Cardiologist Dr. Ammon Reason for Consultation AF CHF  HPI: PIOTR CHRISTOPHER is a 73 y.o. male  with a past medical history of  CAD (s/p recent CABG with LIMA to LAD, SVG to ramus and SVG to PDA on 02/16/2021 with Dr. Leanor), postoperative atrial fibrillation (on amiodarone  and Eliquis , dilated cardiomyopathy with LVEF 20-25%, mod MR, 07/22/2021 s/p dual chamber ICD 06/22/21, history of frequent PVCs, type 2 diabetes, history of TIA, history of penile cancer, hypertension  who presented to the ED on 03/19/2024 for swelling. Cardiology was consulted for further evaluation.   Interval history: -Patient seen and examined this AM, resting in bedside chair, no family at bedside.  -Reports good UOP with lasix  gtt. SOB and abdominal distention improved.  -LE mildly improved.   Review of systems complete and found to be negative unless listed above    Past Medical History:  Diagnosis Date   Arrhythmia    atrial fibrillation   CHF (congestive heart failure) (HCC)    Coronary artery disease    Diabetes mellitus without complication (HCC)    GERD (gastroesophageal reflux disease)    Hypercholesteremia    Hypertension    Sleep apnea    Stroke [facility])    2015 or longer ago    Past Surgical History:  Procedure Laterality Date   CATARACT EXTRACTION Bilateral 04/06/2022   CCM GENERATOR AND A/V LEAD INSERTION N/A 09/04/2022   Procedure: CCM GENERATOR AND A/V LEAD INSERTION;  Surgeon: Cindie Ole DASEN, MD;  Location: [facility] INVASIVE CV LAB;  Service: Cardiovascular;  Laterality: N/A;   CHOLECYSTECTOMY     COLONOSCOPY WITH PROPOFOL  N/A 04/14/2019   Procedure: COLONOSCOPY WITH PROPOFOL ;  Surgeon: Toledo, Ladell POUR, MD;  Location: [facility] ENDOSCOPY;  Service:  Gastroenterology;  Laterality: N/A;   CORONARY ARTERY BYPASS GRAFT  02/16/2021   ICD IMPLANT N/A 06/22/2021   Procedure: ICD IMPLANT;  Surgeon: Cindie Ole DASEN, MD;  Location: [facility] INVASIVE CV LAB;  Service: Cardiovascular;  Laterality: N/A;   KNEE ARTHROSCOPY     RENAL CYST EXCISION     RIGHT HEART CATH Right 02/27/2024   Procedure: RIGHT HEART CATH;  Surgeon: Ammon Blunt, MD;  Location: [facility] INVASIVE CV LAB;  Service: Cardiovascular;  Laterality: Right;    Medications Prior to Admission  Medication Sig Dispense Refill Last Dose/Taking   albuterol  (VENTOLIN  HFA) 108 (90 Base) MCG/ACT inhaler Inhale 2 puffs into the lungs every 6 (six) hours as needed for wheezing or shortness of breath. 8.5 g 2 Unknown   apixaban  (ELIQUIS ) 5 MG TABS tablet Take 1 tablet (5 mg total) by mouth 2 (two) times daily. 180 tablet 3 03/19/2024   atorvastatin  (LIPITOR ) 80 MG tablet Take 1 tablet (80 mg total) by mouth daily. 30 tablet 5 03/19/2024   bumetanide  (BUMEX ) 2 MG tablet Take 1 tablet (2 mg total) by mouth daily. 30 tablet 5 03/19/2024   calcitRIOL  (ROCALTROL ) 0.25 MCG capsule Take 0.25 mcg by mouth daily.   03/19/2024   metFORMIN  (GLUCOPHAGE ) 500 MG tablet Take 500 mg by mouth 2 (two) times daily.   03/19/2024   metolazone  (ZAROXOLYN ) 2.5 MG tablet Take 2.5 mg by mouth once a week. Takes on Fridays   03/14/2024   metoprolol  succinate (TOPROL -XL) 25 MG 24 hr tablet TAKE 1/2  TABLET BY MOUTH EVERY EVENING 45 tablet 3 03/18/2024   midodrine  (PROAMATINE ) 5 MG tablet Take 1 tablet (5 mg total) by mouth 2 (two) times daily with a meal. 60 tablet 3 03/19/2024   omeprazole (PRILOSEC) 40 MG capsule Take 40 mg by mouth 2 (two) times daily.   03/19/2024   ondansetron  (ZOFRAN -ODT) 4 MG disintegrating tablet Take 1 tablet (4 mg total) by mouth every 8 (eight) hours as needed for nausea or vomiting. 12 tablet 0 Unknown   spironolactone  (ALDACTONE ) 25 MG tablet Take 0.5 tablets (12.5 mg total) by mouth daily.    03/19/2024   tamsulosin  (FLOMAX ) 0.4 MG CAPS capsule Take 0.4 mg by mouth in the morning and at bedtime.   03/19/2024   Continuous Glucose Sensor (FREESTYLE LIBRE 2 SENSOR) MISC by Does not apply route.      Insulin  Pen Needle (COMFORT EZ PEN NEEDLES) 32G X 8 MM MISC by Other route.      Social History   Socioeconomic History   Marital status: Married    Spouse name: Not on file   Number of children: Not on file   Years of education: Not on file   Highest education level: Not on file  Occupational History   Not on file  Tobacco Use   Smoking status: Never   Smokeless tobacco: Never  Vaping Use   Vaping status: Never Used  Substance and Sexual Activity   Alcohol use: Not Currently   Drug use: No   Sexual activity: Not Currently  Other Topics Concern   Not on file  Social History Narrative   Not on file   Social Drivers of Health   Financial Resource Strain: Low Risk  (03/12/2024)   Received from [facility]   Overall Financial Resource Strain (CARDIA)    Difficulty of Paying Living Expenses: Not hard at all  Food Insecurity: No Food Insecurity (03/20/2024)   Hunger Vital Sign    Worried About Running Out of Food in the Last Year: Never true    Ran Out of Food in the Last Year: Never true  Transportation Needs: No Transportation Needs (03/20/2024)   PRAPARE - Administrator, Civil Service (Medical): No    Lack of Transportation (Non-Medical): No  Physical Activity: Not on file  Stress: Not on file  Social Connections: Moderately Integrated (03/20/2024)   Social Connection and Isolation Panel    Frequency of Communication with Friends and Family: More than three times a week    Frequency of Social Gatherings with Friends and Family: More than three times a week    Attends Religious Services: More than 4 times per year    Active Member of [employer] or Organizations: No    Attends Banker Meetings: Never    Marital Status: Married   Catering Manager Violence: Not At Risk (03/21/2024)   Humiliation, Afraid, Rape, and Kick questionnaire    Fear of Current or Ex-Partner: No    Emotionally Abused: No    Physically Abused: No    Sexually Abused: No    Family History  Problem Relation Age of Onset   Parkinson's disease Mother    Lupus Mother    Heart disease Father      Vitals:   03/24/24 0012 03/24/24 0404 03/24/24 0500 03/24/24 0747  BP: 98/66 95/70  103/71  Pulse: 70 70  73  Resp:  20  18  Temp:  98.1 F (36.7 C)  (!) 97.3 F (36.3  C)  TempSrc:      SpO2:  100%  95%  Weight:   84.7 kg   Height:        PHYSICAL EXAM General: Chronically ill-appearing male, well nourished, in no acute distress. HEENT: Normocephalic and atraumatic. Neck: No JVD.  Lungs: Normal respiratory effort on room air. Clear bilaterally to auscultation. No wheezes, crackles, rhonchi.  Heart: HRRR. Normal S1 and S2 without gallops or murmurs.  Abdomen: Non-distended. Msk: Normal strength and tone for age. Extremities: Warm and well perfused. No clubbing, cyanosis.  2+ pitting edema.  Neuro: Alert and oriented X 3. Psych: Answers questions appropriately.   Labs: Basic Metabolic Panel: Recent Labs    03/23/24 0436 03/24/24 0303  NA 127* 125*  K 3.3* 3.7  CL 84* 85*  CO2 30 30  GLUCOSE 91 107*  BUN 51* 48*  CREATININE 2.19* 2.12*  CALCIUM  8.8* 8.9  MG 2.1 2.2   Liver Function Tests: No results for input(s): AST, ALT, ALKPHOS, BILITOT, PROT, ALBUMIN  in the last 72 hours.  No results for input(s): LIPASE, AMYLASE in the last 72 hours. CBC: No results for input(s): WBC, NEUTROABS, HGB, HCT, MCV, PLT in the last 72 hours.  Cardiac Enzymes: No results for input(s): CKTOTAL, CKMB, CKMBINDEX, TROPONINIHS in the last 72 hours. BNP: No results for input(s): BNP in the last 72 hours. D-Dimer: No results for input(s): DDIMER in the last 72 hours. Hemoglobin A1C: No results for  input(s): HGBA1C in the last 72 hours. Fasting Lipid Panel: No results for input(s): CHOL, HDL, LDLCALC, TRIG, CHOLHDL, LDLDIRECT in the last 72 hours. Thyroid Function Tests: No results for input(s): TSH, T4TOTAL, T3FREE, THYROIDAB in the last 72 hours.  Invalid input(s): FREET3 Anemia Panel: No results for input(s): VITAMINB12, FOLATE, FERRITIN, TIBC, IRON, RETICCTPCT in the last 72 hours.   Radiology: CUP PACEART REMOTE DEVICE CHECK Result Date: 03/20/2024 ICD: Scheduled remote reviewed. Normal device function.  Presenting rhythm: AS/VS PAF, some episodes FFRWS, Eliquis  per EPIC EMR, AF burden 0.5%, longest 10 min 56 sec 12 NSVT, V>A, V- rates 200-250 bpm  2 VT in monitor only, > 20 beats, 201 bpm (03/01/2024 #42) 171 bpm (02/03/2024 #36) Abnormal HF diagnostics this monitoring period Next remote transmission per protocol.  ML, CVRS  US  ABDOMEN LIMITED RUQ (LIVER/GB) Result Date: 03/19/2024 EXAM: Right Upper Quadrant Abdominal Ultrasound 03/19/2024 02:54:40 PM TECHNIQUE: Real-time ultrasonography of the right upper quadrant of the abdomen was performed. COMPARISON: US  Abdomen 01/19/2024. CLINICAL HISTORY: Ascites, cholecystectomy. FINDINGS: LIVER: The liver demonstrates normal echogenicity. No intrahepatic biliary ductal dilatation. No evidence of mass. Mild ascites is noted in the right upper quadrant around the liver. BILIARY SYSTEM: Status post cholecystectomy. Common bile duct is within normal limits measuring 3.1 mm. IMPRESSION: 1. Mild ascites in the right upper quadrant around the liver. 2. Status post cholecystectomy. Electronically signed by: Lynwood Seip MD 03/19/2024 03:24 PM EST RP Workstation: [id number]   DG Chest 2 View Result Date: 03/19/2024 CLINICAL DATA:  Shortness of breath. EXAM: DG CHEST 2V COMPARISON:  Chest [place]  dated 02/11/2024. FINDINGS: Small right pleural effusion and right lung base atelectasis. Trace left pleural effusion  suspected pneumonia is not excluded. No pneumothorax. Stable cardiomegaly. Bilateral pectoral pacemaker devices. Median sternotomy wires. No acute osseous pathology. IMPRESSION: Small right and trace left pleural effusion and right lung base atelectasis or infiltrate, relatively similar to prior radiograph. Electronically Signed   By: Vanetta Chou M.D.   On: 03/19/2024 11:39   CARDIAC CATHETERIZATION Result Date:  02/27/2024   Hemodynamic findings consistent with moderate pulmonary hypertension.     RA   27/26,  22 mmHg           RV   49/6,    21 mmHg           PA   54/29,  37 mmHg           PW  29/35,  27 mmHg           CO 2.65           CI  1.39 2.  Moderate pulmonary hypertension    ECHO 03/2024: SEVERE LEFT VENTRICULAR SYSTOLIC DYSFUNCTION WITH NO LVH, LV SIZE IS MODERATELY ENLARGED  ESTIMATED EF: 25%, CALC EF(2D): 31%  NORMAL LA PRESSURES WITH NORMAL DIASTOLIC FUNCTION  MILD RIGHT VENTRICULAR SYSTOLIC DYSFUNCTION  VALVULAR REGURGITATION: No AR, MODERATE MR, TRIVIAL PR, MODERATE TR                       NO VALVULAR STENOSIS         TELEMETRY (personally reviewed): NSR rate 70s  EKG (personally reviewed): normal sinus rhythm right bundle blanch block rate 83 bpm  Data reviewed by me 03/24/2024: last 24h vitals tele labs imaging I/O ED provider note, admission H&P, hospitalist progress note  Principal Problem:   Acute exacerbation of CHF (congestive heart failure) (HCC) Active Problems:   GERD (gastroesophageal reflux disease)   Hypertension   HFrEF (heart failure with reduced ejection fraction) (HCC)   CAD s/p CABG x 3   Chronic combined systolic and diastolic congestive heart failure (HCC)   Atrial fibrillation, chronic (HCC)   Obesity (BMI 30.0-34.9)   BPH (benign prostatic hyperplasia)   DM2 (diabetes mellitus, type 2) (HCC)   CKD stage 3b, GFR 30-44 ml/min (HCC)    ASSESSMENT AND PLAN:  DAMIAN BUCKLES is a 73 y.o. male  with a past medical history of  CAD (s/p recent  CABG with LIMA to LAD, SVG to ramus and SVG to PDA on 02/16/2021 with Dr. Leanor), postoperative atrial fibrillation (on amiodarone  and Eliquis , dilated cardiomyopathy with LVEF 20-25%, mod MR, 07/22/2021 s/p dual chamber ICD 06/22/21, history of frequent PVCs, type 2 diabetes, history of TIA, history of penile cancer, hypertension  who presented to the ED on 03/19/2024 for swelling. Cardiology was consulted for further evaluation.   # Acute on chronic HFrEF # Coronary artery disease s/p CABG 2022 # Ischemic cardiomyopathy s/p ICD # Chronic kidney disease stage IIIb # Paroxysmal atrial fibrillation Presented with worsening LE edema and abdominal distention. BNP elevated at 19,000. Started on IV Lasix  infusion in the ED.  - Continue IV lasix  infusion, dosing per nephrology. Appreciate their recommendations.  - Continue metolazone  3 times per week. - GDMT held given hypotension, renal function. Consider resuming during admission pending BP, renal function, volume status. - Continue Eliquis  5 mg bid.  - Continue atorvastatin  80 mg daily.   ADDENDUM: Nephro switched lasix  to bumex , continuing spironolactone  and metolazone  twice weekly. Scheduled to see advanced HF in clinic tomorrow. Ok for DC.   This patient's plan of care was discussed and created with Dr. Custovic and she is in agreement.  Signed: Danita Bloch, PA-C  03/24/2024, 9:14 AM [facility] Cardiology

## 2024-03-24 NOTE — Plan of Care (Signed)
  Problem: Education: Goal: Knowledge of General Education information will improve Description: Including pain rating scale, medication(s)/side effects and non-pharmacologic comfort measures Outcome: Progressing   Problem: Health Behavior/Discharge Planning: Goal: Ability to manage health-related needs will improve Outcome: Progressing   Problem: Clinical Measurements: Goal: Ability to maintain clinical measurements within normal limits will improve Outcome: Progressing Goal: Will remain free from infection Outcome: Progressing Goal: Diagnostic test results will improve Outcome: Progressing Goal: Respiratory complications will improve Outcome: Progressing Goal: Cardiovascular complication will be avoided Outcome: Progressing   Problem: Nutrition: Goal: Adequate nutrition will be maintained Outcome: Progressing   Problem: Activity: Goal: Risk for activity intolerance will decrease Outcome: Progressing   Problem: Coping: Goal: Level of anxiety will decrease Outcome: Progressing   Problem: Skin Integrity: Goal: Risk for impaired skin integrity will decrease Outcome: Progressing   Problem: Education: Goal: Ability to describe self-care measures that may prevent or decrease complications (Diabetes Survival Skills Education) will improve Outcome: Progressing Goal: Individualized Educational Video(s) Outcome: Progressing   Problem: Coping: Goal: Ability to adjust to condition or change in health will improve Outcome: Progressing   Problem: Health Behavior/Discharge Planning: Goal: Ability to identify and utilize available resources and services will improve Outcome: Progressing Goal: Ability to manage health-related needs will improve Outcome: Progressing   Problem: Fluid Volume: Goal: Ability to maintain a balanced intake and output will improve Outcome: Progressing   Problem: Metabolic: Goal: Ability to maintain appropriate glucose levels will improve Outcome:  Progressing   Problem: Skin Integrity: Goal: Risk for impaired skin integrity will decrease Outcome: Progressing   Problem: Tissue Perfusion: Goal: Adequacy of tissue perfusion will improve Outcome: Progressing   Problem: Nutritional: Goal: Maintenance of adequate nutrition will improve Outcome: Progressing Goal: Progress toward achieving an optimal weight will improve Outcome: Progressing

## 2024-03-24 NOTE — Plan of Care (Signed)

## 2024-03-24 NOTE — Discharge Summary (Signed)
 Jermaine Kramer FMW:[id number] DOB: March 19, 1951 DOA: 03/19/2024  PCP: Perri Constance Sor, PA-C  Admit date: 03/19/2024 Discharge date: 03/24/2024  Time spent: 35 minutes  Recommendations for Outpatient Follow-up:  Advanced heart failure cardiology f/u tomorrow as scheduled Cardiology f/u 1 week Nephrology f/u 1 week Pcp f/u as well, attention to glucose control then (metformin  held) Monitor sodium and kidney function    Discharge Diagnoses:  Principal Problem:   Acute exacerbation of CHF (congestive heart failure) (HCC) Active Problems:   GERD (gastroesophageal reflux disease)   Hypertension   HFrEF (heart failure with reduced ejection fraction) (HCC)   CAD s/p CABG x 3   Chronic combined systolic and diastolic congestive heart failure (HCC)   Atrial fibrillation, chronic (HCC)   Obesity (BMI 30.0-34.9)   BPH (benign prostatic hyperplasia)   DM2 (diabetes mellitus, type 2) (HCC)   CKD stage 3b, GFR 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 year old male with medical history of hypertension, hyperlipidemia c/b by CAD status post CABG, type 2 diabetes, HFrEF (EF 25% in 2024) status post ICD with pacemaker, atrial fibrillation presenting to the ED from his nephrology office for anasarca and complaints of weight gain and dyspnea.     Pt states he states he has been retaining fluid over the last few months. Pt reports urinating less and less over this time period. He has tried outpatient IV lasix  device at home about 7 weeks ago. He is taking his medications very regularly. Baseline weight is 180 lb. He is having some coughing with some sputum production. Denies any fevers, or chills.      On arrival to the ED patient was noted to be HDS stable.   Lab work and imaging obtained.  CBC with no leukocytosis, mild anemia at baseline.  BMP with hyponatremia at 124 with hypochloremia, AKI on CKD with creatinine at 2.8 with baseline around 2.  proBNP elevated at 19.4k.  Chest x-ray with trace bilateral pleural effusions, right upper quadrant obtained by EDP given concern for ascites and pending.  Given need for continued care, [facility] contacted for admission.    Hospital Course:   # HFrEF with acute exacerbation # ICD status 3rd hospitalization in as many months, significant edema mainly in abdomen, small pleural effusions as well. EF 25% on recent TTE. Diuresed here with lasix  gtt, out 5.5 liters. Not yet euvolemic but no o2 requirement, patient desires discharge so as to f/u in advanced chf clinic tomorrow, cardiology and nephrology have seen and discussed and advise discharge resuming home bumex /metolazone jordan. Patient also received fluid and sodium restriction counseling, admits he has work to do there. Palliative also consulted, remains full code full scope. - diuresis as below - consider adding [id number]? - chf clinic f/u tomorrow, f/u cardiology and nephrology next week   # Hyponatremia Likely 2/2 chf exacerbation. 122 on arrival, slowly improving but not yet normalized - will need close outpt monitoring   # AKI on ckd 3b Baseline cr of around 2 here 2.5, slow improvement with diuresis, 2.12 day of discharge - nephrology f/u next week   # CAD s/p CABG x3 No chest pain - home apixaban    # A-fib Rate controlled - metop on hold 2/2 hypotension -  cont home apixaban    # Hypotension, chronic - home midodrine    # BPH - home flomax  but will try a reduced dose given chronic hypotension   # T2DM Euglycemic - hold metformin  given worsening kidney function  Procedures: none   Consultations: Cardiology nephrology  Discharge Exam: Vitals:   03/24/24 0404 03/24/24 0747  BP: 95/70 103/71  Pulse: 70 73  Resp: 20 18  Temp: 98.1 F  (36.7 C) (!) 97.3 F (36.3 C)  SpO2: 100% 95%    General: NAD Cardiovascular: RRR, distant heart sounds, soft systolic murmur Respiratory: ctab save for faint rales at bases Abdomen: distended Extremities: trace LE edema  Discharge Instructions   Discharge Instructions     Diet - low sodium heart healthy   Complete by: As directed    Increase activity slowly   Complete by: As directed       Allergies as of 03/24/2024       Reactions   Doxycycline Anaphylaxis   Patient does not recall having this reaction.    Heparin  Other (See Comments)   heparin -induced thrombocytopenia   Jardiance [empagliflozin] Itching   Groin infection Caused a groin infection.         Medication List     PAUSE taking these medications    metFORMIN  500 MG tablet Wait to take this until your doctor or other care provider tells you to start again. Commonly known as: GLUCOPHAGE  Take 500 mg by mouth 2 (two) times daily.   metoprolol  succinate 25 MG 24 hr tablet Wait to take this until your doctor or other care provider tells you to start again. Commonly known as: TOPROL -XL TAKE 1/2 TABLET BY MOUTH EVERY EVENING       TAKE these medications    albuterol  108 (90 Base) MCG/ACT inhaler Commonly known as: VENTOLIN  HFA Inhale 2 puffs into the lungs every 6 (six) hours as needed for wheezing or shortness of breath.   apixaban  5 MG Tabs tablet Commonly known as: ELIQUIS  Take 1 tablet (5 mg total) by mouth 2 (two) times daily.   atorvastatin  80 MG tablet Commonly known as: LIPITOR  Take 1 tablet (80 mg total) by mouth daily.   bumetanide  2 MG tablet Commonly known as: BUMEX  Take 1 tablet (2 mg total) by mouth daily.   calcitRIOL  0.25 MCG capsule Commonly known as: ROCALTROL  Take 0.25 mcg by mouth daily.   Comfort EZ Pen Needles 32G X 8 MM Misc Generic drug: Insulin  Pen Needle by Other route.   FreeStyle Libre 2 Sensor Misc by Does not apply route.   metolazone  2.5 MG  tablet Commonly known as: ZAROXOLYN  Take 2.5 mg by mouth once a week. Takes on Fridays   midodrine  5 MG tablet Commonly known as: PROAMATINE  Take 1 tablet (5 mg total) by mouth 3 (three) times daily with meals. What changed: when to take this   omeprazole 40 MG capsule Commonly known as: PRILOSEC Take 40 mg by mouth 2 (two) times daily.   ondansetron  4 MG disintegrating tablet Commonly known as: ZOFRAN -ODT Take 1 tablet (4 mg total) by mouth every 8 (eight) hours as needed for nausea or vomiting.   spironolactone  25 MG tablet Commonly known as: ALDACTONE  Take 1 tablet (25 mg total) by mouth daily. What changed: how much to take   tamsulosin  0.4 MG Caps capsule Commonly known as: FLOMAX  Take 1 capsule (0.4 mg total) by mouth daily after supper. What changed: when to take this       Allergies  Allergen Reactions   Doxycycline Anaphylaxis    Patient does not recall having this reaction.     Heparin  Other (See Comments)    heparin -induced thrombocytopenia    Jardiance [Empagliflozin] Itching    Groin infection Caused a groin infection.      Follow-up Information     Paraschos, Alexander, MD. Go in 1 week(s).   Specialty: Cardiology Contact information: [address] [facility] [place] [address] [phone]         Rolan Ezra RAMAN, MD. Go on 03/25/2024.   Specialty: Cardiology Why: Previous scheduled CHF appt and  recent hospital hosptialization Please bring all medications to follow-up appointment [facility], [address] Second Floor Free Valet Parking at the door Contact information: [address] [place] [address] [phone]         Lateef, Munsoor, MD Follow up.   Specialty: Nephrology Contact information: [address] [place] [address] [phone]         Perri Constance Sor, PA-C Follow up.   Specialty: Physician Assistant Contact information: [address] [place] [address] [phone]                  The results of significant diagnostics from this hospitalization (including imaging, microbiology, ancillary and laboratory) are listed below for reference.    Significant Diagnostic Studies: CUP PACEART REMOTE DEVICE CHECK Result Date: 03/20/2024 ICD: Scheduled remote reviewed. Normal device function.  Presenting rhythm: AS/VS PAF, some episodes FFRWS, Eliquis  per EPIC EMR, AF burden 0.5%, longest 10 min 56 sec 12 NSVT, V>A, V- rates 200-250 bpm  2 VT in monitor only, > 20 beats, 201 bpm (03/01/2024 #42) 171 bpm (02/03/2024 #36) Abnormal HF diagnostics this monitoring period Next remote transmission per protocol.  ML, CVRS  US  ABDOMEN LIMITED RUQ (LIVER/GB) Result Date: 03/19/2024 EXAM: Right Upper Quadrant Abdominal Ultrasound 03/19/2024 02:54:40 PM TECHNIQUE: Real-time ultrasonography of the right upper quadrant of the abdomen was performed. COMPARISON: US  Abdomen 01/19/2024. CLINICAL HISTORY: Ascites, cholecystectomy. FINDINGS: LIVER: The liver demonstrates normal echogenicity. No intrahepatic biliary ductal dilatation. No evidence of mass. Mild ascites is noted in the right upper quadrant around the liver. BILIARY SYSTEM: Status post cholecystectomy. Common bile duct is within normal limits measuring 3.1 mm. IMPRESSION: 1. Mild ascites in the right upper quadrant around the liver. 2. Status post cholecystectomy. Electronically signed by: Lynwood Seip MD 03/19/2024 03:24 PM EST RP Workstation: [id number]   DG Chest 2 View Result Date: 03/19/2024 CLINICAL DATA:  Shortness of breath. EXAM: DG CHEST 2V COMPARISON:  Chest [place]  dated 02/11/2024. FINDINGS: Small right pleural effusion and right lung base atelectasis. Trace left pleural effusion suspected pneumonia is not excluded. No pneumothorax. Stable cardiomegaly. Bilateral pectoral pacemaker devices. Median sternotomy wires. No acute osseous pathology. IMPRESSION: Small  right and trace left pleural effusion and right lung base atelectasis or infiltrate, relatively similar to prior radiograph. Electronically Signed   By: Vanetta Chou M.D.   On: 03/19/2024 11:39   CARDIAC CATHETERIZATION Result Date: 02/27/2024   Hemodynamic findings consistent with moderate pulmonary hypertension.     RA   27/26,  22 mmHg           RV   49/6,    21 mmHg           PA   54/29,  37 mmHg           PW  29/35,  27 mmHg  CO 2.65           CI  1.39 2.  Moderate pulmonary hypertension    Microbiology: No results found for this or any previous visit (from the past 240 hours).   Labs: Basic Metabolic Panel: Recent Labs  Lab 03/19/24 1038 03/19/24 1918 03/20/24 0803 03/20/24 1258 03/20/24 1753 03/21/24 0030 03/21/24 0340 03/21/24 0640 03/22/24 0421 03/23/24 0436 03/24/24 0303  NA 124*   < > 123* 123* 122* 125* 125* 125* 126* 127* 125*  K 4.4   < > 4.2 4.0 4.2 4.0 3.7 3.6 3.7 3.3* 3.7  CL 87*   < > 87* 85* 85* 85* 86* 87* 85* 84* 85*  CO2 26   < > 25 27 25 26 26 27 29 30 30   GLUCOSE 159*   < > 129* 231* 194* 149* 132* 135* 114* 91 107*  BUN 53*   < > 50* 51* 48* 49* 49* 50* 48* 51* 48*  CREATININE 2.85*   < > 2.51* 2.54* 2.58* 2.55* 2.48* 2.45* 2.39* 2.19* 2.12*  CALCIUM  8.8*   < > 8.8* 8.8* 8.7* 8.8* 8.6* 8.8* 8.9 8.8* 8.9  MG 1.7  --   --   --   --   --  2.4  --  2.2 2.1 2.2  PHOS 3.8   < > 3.8 3.9 4.2 4.4  --  4.4  --   --   --    < > = values in this interval not displayed.   Liver Function Tests: Recent Labs  Lab 03/19/24 1038 03/19/24 1918 03/20/24 0803 03/20/24 1258 03/20/24 1753 03/21/24 0030 03/21/24 0640  AST 17  --   --   --   --   --   --   ALT 8  --   --   --   --   --   --   ALKPHOS 76  --   --   --   --   --   --   BILITOT 0.8  --   --   --   --   --   --   PROT 6.2*  --   --   --   --   --   --   ALBUMIN  3.9   < > 3.8 3.9 3.8 3.8 3.8   < > = values in this interval not displayed.   No results for input(s): LIPASE, AMYLASE in  the last 168 hours. No results for input(s): AMMONIA in the last 168 hours. CBC: Recent Labs  Lab 03/19/24 1038 03/20/24 0803  WBC 6.0 5.6  HGB 11.2* 11.0*  HCT 34.6* 32.9*  MCV 76.9* 74.4*  PLT 177 166   Cardiac Enzymes: No results for input(s): CKTOTAL, CKMB, CKMBINDEX, TROPONINI in the last 168 hours. BNP: BNP (last 3 results) Recent Labs    01/19/24 0721 02/01/24 1118 02/09/24 0921  BNP 1,161.0* 941.8* 1,871.3*    ProBNP (last 3 results) Recent Labs    03/19/24 1038  PROBNP 19,467.0*    CBG: Recent Labs  Lab 03/23/24 0709 03/23/24 1203 03/23/24 1621 03/23/24 2030 03/24/24 0825  GLUCAP 100* 212* 160* 178* 127*       Signed:  Devaughn KATHEE Ban MD.  [facility] 03/24/2024, 10:40 AM

## 2024-03-24 NOTE — Progress Notes (Signed)
 [facility]  ROUNDING NOTE   Subjective:   Patient seen sitting up in chair Room air No lower extremity edema  Objective:  Vital signs in last 24 hours:  Temp:  [97.3 F (36.3 C)-98.1 F (36.7 C)] 97.3 F (36.3 C) (11/24 0747) Pulse Rate:  [70-81] 73 (11/24 0747) Resp:  [17-20] 18 (11/24 0747) BP: (88-103)/(66-75) 103/71 (11/24 0747) SpO2:  [95 %-100 %] 95 % (11/24 0747) Weight:  [84.7 kg] 84.7 kg (11/24 0500)  Weight change: -1.3 kg Filed Weights   03/22/24 0858 03/23/24 0410 03/24/24 0500  Weight: 86 kg 86.4 kg 84.7 kg    Intake/Output: I/O last 3 completed shifts: In: 100 [P.O.:100] Out: 2960 [Urine:2960]   Intake/Output this shift:  No intake/output data recorded.  Physical Exam: General: NAD  Head: Normocephalic  Eyes: Anicteric  Lungs:  Clear, room air  Heart: Murmur, regular rate  Abdomen:  Distended  Extremities: No peripheral edema.  Neurologic: Awake, alert, conversant  Skin: Warm,dry, no rash  Access: None    Basic Metabolic Panel: Recent Labs  Lab 03/19/24 1038 03/19/24 1918 03/20/24 0803 03/20/24 1258 03/20/24 1753 03/21/24 0030 03/21/24 0340 03/21/24 0640 03/22/24 0421 03/23/24 0436 03/24/24 0303  NA 124*   < > 123* 123* 122* 125* 125* 125* 126* 127* 125*  K 4.4   < > 4.2 4.0 4.2 4.0 3.7 3.6 3.7 3.3* 3.7  CL 87*   < > 87* 85* 85* 85* 86* 87* 85* 84* 85*  CO2 26   < > 25 27 25 26 26 27 29 30 30   GLUCOSE 159*   < > 129* 231* 194* 149* 132* 135* 114* 91 107*  BUN 53*   < > 50* 51* 48* 49* 49* 50* 48* 51* 48*  CREATININE 2.85*   < > 2.51* 2.54* 2.58* 2.55* 2.48* 2.45* 2.39* 2.19* 2.12*  CALCIUM  8.8*   < > 8.8* 8.8* 8.7* 8.8* 8.6* 8.8* 8.9 8.8* 8.9  MG 1.7  --   --   --   --   --  2.4  --  2.2 2.1 2.2  PHOS 3.8   < > 3.8 3.9 4.2 4.4  --  4.4  --   --   --    < > = values in this interval not displayed.    Liver Function Tests: Recent Labs  Lab 03/19/24 1038 03/19/24 1918 03/20/24 0803 03/20/24 1258 03/20/24 1753  03/21/24 0030 03/21/24 0640  AST 17  --   --   --   --   --   --   ALT 8  --   --   --   --   --   --   ALKPHOS 76  --   --   --   --   --   --   BILITOT 0.8  --   --   --   --   --   --   PROT 6.2*  --   --   --   --   --   --   ALBUMIN  3.9   < > 3.8 3.9 3.8 3.8 3.8   < > = values in this interval not displayed.   No results for input(s): LIPASE, AMYLASE in the last 168 hours. No results for input(s): AMMONIA in the last 168 hours.  CBC: Recent Labs  Lab 03/19/24 1038 03/20/24 0803  WBC 6.0 5.6  HGB 11.2* 11.0*  HCT 34.6* 32.9*  MCV 76.9* 74.4*  PLT 177  166    Cardiac Enzymes: No results for input(s): CKTOTAL, CKMB, CKMBINDEX, TROPONINI in the last 168 hours.  BNP: Invalid input(s): POCBNP  CBG: Recent Labs  Lab 03/23/24 0709 03/23/24 1203 03/23/24 1621 03/23/24 2030 03/24/24 0825  GLUCAP 100* 212* 160* 178* 127*    Microbiology: Results for orders placed or performed during the hospital encounter of 01/19/24  Resp panel by RT-PCR (RSV, Flu A&B, Covid) Anterior Nasal Swab     Status: None   Collection Time: 01/19/24  7:22 AM   Specimen: Anterior Nasal Swab  Result Value Ref Range Status   SARS Coronavirus 2 by RT PCR NEGATIVE NEGATIVE Final    Comment: (NOTE) SARS-CoV-2 target nucleic acids are NOT DETECTED.  The SARS-CoV-2 RNA is generally detectable in upper respiratory specimens during the acute phase of infection. The lowest concentration of SARS-CoV-2 viral copies this assay can detect is 138 copies/mL. A negative result does not preclude SARS-Cov-2 infection and should not be used as the sole basis for treatment or other patient management decisions. A negative result may occur with  improper specimen collection/handling, submission of specimen other than nasopharyngeal swab, presence of viral mutation(s) within the areas targeted by this assay, and inadequate number of viral copies(<138 copies/mL). A negative result must be  combined with clinical observations, patient history, and epidemiological information. The expected result is Negative.  Fact Sheet for Patients:  bloggercourse.com  Fact Sheet for Healthcare Providers:  seriousbroker.it  This test is no t yet approved or cleared by the [place]  FDA and  has been authorized for detection and/or diagnosis of SARS-CoV-2 by FDA under an Emergency Use Authorization (EUA). This EUA will remain  in effect (meaning this test can be used) for the duration of the COVID-19 declaration under Section 564(b)(1) of the Act, 21 U.S.C.section 360bbb-3(b)(1), unless the authorization is terminated  or revoked sooner.       Influenza A by PCR NEGATIVE NEGATIVE Final   Influenza B by PCR NEGATIVE NEGATIVE Final    Comment: (NOTE) The Xpert Xpress SARS-CoV-2/FLU/RSV plus assay is intended as an aid in the diagnosis of influenza from Nasopharyngeal swab specimens and should not be used as a sole basis for treatment. Nasal washings and aspirates are unacceptable for Xpert Xpress SARS-CoV-2/FLU/RSV testing.  Fact Sheet for Patients: bloggercourse.com  Fact Sheet for Healthcare Providers: seriousbroker.it  This test is not yet approved or cleared by the [place]  FDA and has been authorized for detection and/or diagnosis of SARS-CoV-2 by FDA under an Emergency Use Authorization (EUA). This EUA will remain in effect (meaning this test can be used) for the duration of the COVID-19 declaration under Section 564(b)(1) of the Act, 21 U.S.C. section 360bbb-3(b)(1), unless the authorization is terminated or revoked.     Resp Syncytial Virus by PCR NEGATIVE NEGATIVE Final    Comment: (NOTE) Fact Sheet for Patients: bloggercourse.com  Fact Sheet for Healthcare Providers: seriousbroker.it  This test is not yet  approved or cleared by the [place]  FDA and has been authorized for detection and/or diagnosis of SARS-CoV-2 by FDA under an Emergency Use Authorization (EUA). This EUA will remain in effect (meaning this test can be used) for the duration of the COVID-19 declaration under Section 564(b)(1) of the Act, 21 U.S.C. section 360bbb-3(b)(1), unless the authorization is terminated or revoked.  Performed at [facility], [address] [place], [place] [address]     Coagulation Studies: No results for input(s): LABPROT, INR in the last 72 hours.  Urinalysis: No results for input(s): COLORURINE, LABSPEC, PHURINE, GLUCOSEU, HGBUR, BILIRUBINUR, KETONESUR, PROTEINUR, UROBILINOGEN, NITRITE, LEUKOCYTESUR in the last 72 hours.  Invalid input(s): APPERANCEUR    Imaging: No results found.    Medications:      apixaban   5 mg Oral BID   atorvastatin   80 mg Oral Daily   bumetanide  (BUMEX ) IV  2 mg Intravenous Daily   calcitRIOL   0.25 mcg Oral Daily   insulin  aspart  0-5 Units Subcutaneous QHS   insulin  aspart  0-9 Units Subcutaneous TID WC   [START ON 03/27/2024] metolazone   2.5 mg Oral Once per day on Monday Thursday   midodrine   10 mg Oral TID WC   pantoprazole   40 mg Oral Daily   polyethylene glycol  17 g Oral Daily   spironolactone   25 mg Oral Daily   tamsulosin   0.4 mg Oral QPC supper   acetaminophen  **OR** acetaminophen , albuterol , melatonin, methocarbamol , ondansetron , senna-docusate  Assessment/ Plan:  Jermaine Kramer is a 73 y.o.  male with past medical history of atrial fibrillation, hyperlipidemia, diabetes mellitus type 2 with chronic kidney disease, chronic systolic heart failure ejection fraction 20 to 25%, obstructive sleep apnea, hypertension, history of CVA, GERD, coronary disease status post CABG who returns now with shortness of breath and acute exacerbation of chronic systolic heart failure.   Chronic kidney disease stage  IV.  Baseline creatinine 2.4 with GFR 27 on 02/19/2024.  Patient followed by Dr. Marcelino outpatient.  Described outpatient diabetics, reports compliance.  Renal function remains at baseline. Will need follow up with Dr Marcelino at discharge.   Lab Results  Component Value Date   CREATININE 2.12 (H) 03/24/2024   CREATININE 2.19 (H) 03/23/2024   CREATININE 2.39 (H) 03/22/2024    Intake/Output Summary (Last 24 hours) at 03/24/2024 1002 Last data filed at 03/24/2024 0644 Gross per 24 hour  Intake --  Output 1050 ml  Net -1050 ml   2.  Acute on chronic systolic heart failure.  Recent outpatient echo completed on 03/14/2024 shows EF 25%.   Adequate diuresis with Furosemide  drip, will stop today. Will continue Bumex  2mg  daily with spironolactone . Will order Metolazone  2.5mg  twice weekly. Will continue to follow cardiology outpatient.   3. Anemia of chronic kidney disease Lab Results  Component Value Date   HGB 11.0 (L) 03/20/2024    Hemoglobin remains stable  4. Diabetes mellitus type II with chronic kidney disease/renal manifestations: noninsulin dependent. Most recent hemoglobin A1c is 7.1 on 01/19/2024.   Glucose stable, primary team will continue to manage SSI  5. Hyponatremia, acute. Acceptable sodium in October.   Likely a component of fluid overload. Will continue to monitor   6. Hypokalemia  K 3.7 Patient on spironolactone . Corrected  7. Constipation  Miralax  ordered and stool recorded yesterday   LOS: 5 Emmarae Cowdery 11/24/202510:02 AM

## 2024-03-25 ENCOUNTER — Ambulatory Visit (HOSPITAL_BASED_OUTPATIENT_CLINIC_OR_DEPARTMENT_OTHER): Admitting: Cardiology

## 2024-03-25 ENCOUNTER — Ambulatory Visit (HOSPITAL_COMMUNITY): Payer: Self-pay | Admitting: Cardiology

## 2024-03-25 ENCOUNTER — Other Ambulatory Visit
Admission: RE | Admit: 2024-03-25 | Discharge: 2024-03-25 | Disposition: A | Discharge status: Home / Self Care | Source: Ambulatory Visit | Attending: Cardiology | Admitting: Cardiology

## 2024-03-25 ENCOUNTER — Ambulatory Visit: Admitting: Cardiology

## 2024-03-25 ENCOUNTER — Encounter (HOSPITAL_COMMUNITY): Payer: Self-pay

## 2024-03-25 ENCOUNTER — Encounter: Payer: Self-pay | Admitting: Cardiology

## 2024-03-25 VITALS — BP 97/65 | HR 88 | Wt 187.5 lb

## 2024-03-25 DIAGNOSIS — I5022 Chronic systolic (congestive) heart failure: Secondary | ICD-10-CM | POA: Diagnosis not present

## 2024-03-25 DIAGNOSIS — I480 Paroxysmal atrial fibrillation: Secondary | ICD-10-CM | POA: Diagnosis not present

## 2024-03-25 LAB — BASIC METABOLIC PANEL WITH GFR
Anion gap: 10 (ref 5–15)
BUN: 43 mg/dL — ABNORMAL HIGH (ref 8–23)
CO2: 30 mmol/L (ref 22–32)
Calcium: 9.3 mg/dL (ref 8.9–10.3)
Chloride: 84 mmol/L — ABNORMAL LOW (ref 98–111)
Creatinine, Ser: 2.33 mg/dL — ABNORMAL HIGH (ref 0.61–1.24)
GFR, Estimated: 28 mL/min — ABNORMAL LOW (ref >60)
Glucose, Bld: 184 mg/dL — ABNORMAL HIGH (ref 70–99)
Potassium: 3.9 mmol/L (ref 3.5–5.1)
Sodium: 124 mmol/L — ABNORMAL LOW (ref 135–145)

## 2024-03-25 LAB — PRO BRAIN NATRIURETIC PEPTIDE: Pro Brain Natriuretic Peptide: 20964 pg/mL — ABNORMAL HIGH (ref <300.0)

## 2024-03-25 NOTE — H&P (Incomplete)
 Advanced Heart Failure Team History and Physical Note   PCP:  Perri Constance Sor, PA-C  HF Cardiologist: Dr. Rolan  Reason for Admission: Acute on chronic biventricular heart failure with low-output   HPI:    73 y.o. with history of CAD, paroxysmal atrial fibrillation, CKD, and ischemic cardiomyopathy/chronic HFrEF.   Patient had CABG x 3 in 10/22. LV function has remained low since that time, ischemic CMP.  Echo in 5/23 showed EF 30-35%, severe LV dilation, mild Jermaine, low normal RV systolic function. He has a Medtronic ICD. He has paroxysmal atrial fibrillation but is generally in NSR.  Amiodarone  has been stopped.  He has struggled with volume overload and has been given Furoscix  from AHF clinic at times.  This is complicated by CKD stage 3b. He follows with nephrology, Dr. Marcelino.    Patient was admitted in 2/24 with LLQ abdominal pain.  CT showed mesenteric pancolitis.    Echo in 7/24 showed EF 20-25% with mild RV dysfunction.  He had cardiac contractility modulator placed in 5/24 by Dr. Cindie.  Echo in 11/25 (done at [facility] so I cannot review) showed EF 25%, mild RV dysfunction, moderate Jermaine.    Jermaine Kramer has done poorly recently.  He had RHC done through [facility] clinic in 10/25 showing mean RA 22, PA 54/29, mean PCWP 27, and CI 1.39.  He went to the ER at [facility] in 11/25 due to peripheral edema/volume overload with NYHA class IIIb-IV symptoms.  He was admitted from 11/19-24, received IV Lasix .  On midodrine  for low blood pressure. Creatinine was 2.12 on the day of discharge.    Seen in clinic 11/24 (day after discharge. He was markedly volume overloaded. Patient now being directly admitted to [facility] for IV diuresis and inotropic support. Given markedly low cardiac output on recent RHC there is concern that he is end-stage and may require advanced therapies if a candidate.   Home Medications Prior to Admission medications   Medication Sig Start Date End Date Taking?  Authorizing Provider  albuterol  (VENTOLIN  HFA) 108 (90 Base) MCG/ACT inhaler Inhale 2 puffs into the lungs every 6 (six) hours as needed for wheezing or shortness of breath. 02/13/24   Franchot Novel, MD  apixaban  (ELIQUIS ) 5 MG TABS tablet Take 1 tablet (5 mg total) by mouth 2 (two) times daily. 11/28/21   Donette Ellouise LABOR, FNP  atorvastatin  (LIPITOR ) 80 MG tablet Take 1 tablet (80 mg total) by mouth daily. 01/10/22   Donette Ellouise LABOR, FNP  bumetanide  (BUMEX ) 2 MG tablet Take 1 tablet (2 mg total) by mouth daily. 02/21/24   Donette Ellouise LABOR, FNP  calcitRIOL  (ROCALTROL ) 0.25 MCG capsule Take 0.25 mcg by mouth daily. 12/17/23   [provider]  Continuous Glucose Sensor (FREESTYLE LIBRE 2 SENSOR) MISC by Does not apply route.    [provider]  Insulin  Pen Needle (COMFORT EZ PEN NEEDLES) 32G X 8 MM MISC by Other route. 07/16/23 07/15/24  [provider]  metFORMIN  (GLUCOPHAGE ) 500 MG tablet Take 500 mg by mouth 2 (two) times daily. Patient not taking: Reported on 03/25/2024    [provider]  metolazone  (ZAROXOLYN ) 2.5 MG tablet Take 2.5 mg by mouth once a week. Takes on Fridays 01/22/24   [provider]  metoprolol  succinate (TOPROL -XL) 25 MG 24 hr tablet TAKE 1/2 TABLET BY MOUTH EVERY EVENING Patient not taking: Reported on 03/25/2024 06/25/23   Donette Ellouise LABOR, FNP  midodrine  (PROAMATINE ) 5 MG tablet Take 1 tablet (  5 mg total) by mouth 3 (three) times daily with meals. 03/24/24   Wouk, Devaughn Sayres, MD  omeprazole (PRILOSEC) 40 MG capsule Take 40 mg by mouth 2 (two) times daily. 03/11/21   [provider]  ondansetron  (ZOFRAN -ODT) 4 MG disintegrating tablet Take 1 tablet (4 mg total) by mouth every 8 (eight) hours as needed for nausea or vomiting. 04/16/23   Willo Dunnings, MD  spironolactone  (ALDACTONE ) 25 MG tablet Take 1 tablet (25 mg total) by mouth daily. 03/24/24   Wouk, Devaughn Sayres, MD  tamsulosin  (FLOMAX ) 0.4 MG CAPS capsule Take 1  capsule (0.4 mg total) by mouth daily after supper. 03/24/24   Wouk, Devaughn Sayres, MD    Past Medical History: Past Medical History:  Diagnosis Date   Arrhythmia    atrial fibrillation   CHF (congestive heart failure) (HCC)    Coronary artery disease    Diabetes mellitus without complication (HCC)    GERD (gastroesophageal reflux disease)    Hypercholesteremia    Hypertension    Sleep apnea    Stroke [facility])    2015 or longer ago    Past Surgical History: Past Surgical History:  Procedure Laterality Date   CATARACT EXTRACTION Bilateral 04/06/2022   CCM GENERATOR AND A/V LEAD INSERTION N/A 09/04/2022   Procedure: CCM GENERATOR AND A/V LEAD INSERTION;  Surgeon: Cindie Ole DASEN, MD;  Location: [facility] INVASIVE CV LAB;  Service: Cardiovascular;  Laterality: N/A;   CHOLECYSTECTOMY     COLONOSCOPY WITH PROPOFOL  N/A 04/14/2019   Procedure: COLONOSCOPY WITH PROPOFOL ;  Surgeon: Toledo, Ladell POUR, MD;  Location: [facility] ENDOSCOPY;  Service: Gastroenterology;  Laterality: N/A;   CORONARY ARTERY BYPASS GRAFT  02/16/2021   ICD IMPLANT N/A 06/22/2021   Procedure: ICD IMPLANT;  Surgeon: Cindie Ole DASEN, MD;  Location: [facility] INVASIVE CV LAB;  Service: Cardiovascular;  Laterality: N/A;   KNEE ARTHROSCOPY     RENAL CYST EXCISION     RIGHT HEART CATH Right 02/27/2024   Procedure: RIGHT HEART CATH;  Surgeon: Ammon Blunt, MD;  Location: [facility] INVASIVE CV LAB;  Service: Cardiovascular;  Laterality: Right;    Family History: *** Family History  Problem Relation Age of Onset   Parkinson's disease Mother    Lupus Mother    Heart disease Father     Social History: Social History   Socioeconomic History   Marital status: Married    Spouse name: Not on file   Number of children: Not on file   Years of education: Not on file   Highest education level: Not on file  Occupational History   Not on file  Tobacco Use   Smoking status: Never   Smokeless tobacco: Never  Vaping Use   Vaping  status: Never Used  Substance and Sexual Activity   Alcohol use: Not Currently   Drug use: No   Sexual activity: Not Currently  Other Topics Concern   Not on file  Social History Narrative   Not on file   Social Drivers of Health   Financial Resource Strain: Low Risk  (03/12/2024)   Received from [facility]   Overall Financial Resource Strain (CARDIA)    Difficulty of Paying Living Expenses: Not hard at all  Food Insecurity: No Food Insecurity (03/20/2024)   Hunger Vital Sign    Worried About Running Out of Food in the Last Year: Never true    Ran Out of Food in the Last Year: Never true  Transportation Needs: No Transportation Needs (03/20/2024)  PRAPARE - Administrator, Civil Service (Medical): No    Lack of Transportation (Non-Medical): No  Physical Activity: Not on file  Stress: Not on file  Social Connections: Moderately Integrated (03/20/2024)   Social Connection and Isolation Panel    Frequency of Communication with Friends and Family: More than three times a week    Frequency of Social Gatherings with Friends and Family: More than three times a week    Attends Religious Services: More than 4 times per year    Active Member of [employer] or Organizations: No    Attends Banker Meetings: Never    Marital Status: Married    Allergies:  Allergies  Allergen Reactions   Doxycycline Anaphylaxis    Patient does not recall having this reaction.     Heparin  Other (See Comments)    heparin -induced thrombocytopenia    Jardiance [Empagliflozin] Itching    Groin infection Caused a groin infection.      Objective:    Vital Signs:   BP: ()/()  Arterial Line BP: ()/()    There were no vitals filed for this visit.   Physical Exam     General:  Well appearing. No respiratory difficulty HEENT: Normal Neck: Supple. no JVD. Carotids 2+ bilat; no bruits. No lymphadenopathy or thyromegaly appreciated. Cor: PMI nondisplaced.  Regular rate & rhythm. No rubs, gallops or murmurs. Lungs: Clear Abdomen: Soft, nontender, nondistended. No hepatosplenomegaly. No bruits or masses. Good bowel sounds. Extremities: No cyanosis, clubbing, rash, edema Neuro: Alert & oriented x 3, cranial nerves grossly intact. moves all 4 extremities w/o difficulty. Affect pleasant.   Telemetry   ***  EKG   ***  Labs     Basic Metabolic Panel: Recent Labs  Lab 03/19/24 1038 03/19/24 1918 03/20/24 0803 03/20/24 1258 03/20/24 1753 03/21/24 0030 03/21/24 0340 03/21/24 0640 03/22/24 0421 03/23/24 0436 03/24/24 0303 03/25/24 1249  NA 124*   < > 123* 123* 122* 125* 125* 125* 126* 127* 125* 124*  K 4.4   < > 4.2 4.0 4.2 4.0 3.7 3.6 3.7 3.3* 3.7 3.9  CL 87*   < > 87* 85* 85* 85* 86* 87* 85* 84* 85* 84*  CO2 26   < > 25 27 25 26 26 27 29 30 30 30   GLUCOSE 159*   < > 129* 231* 194* 149* 132* 135* 114* 91 107* 184*  BUN 53*   < > 50* 51* 48* 49* 49* 50* 48* 51* 48* 43*  CREATININE 2.85*   < > 2.51* 2.54* 2.58* 2.55* 2.48* 2.45* 2.39* 2.19* 2.12* 2.33*  CALCIUM  8.8*   < > 8.8* 8.8* 8.7* 8.8* 8.6* 8.8* 8.9 8.8* 8.9 9.3  MG 1.7  --   --   --   --   --  2.4  --  2.2 2.1 2.2  --   PHOS 3.8   < > 3.8 3.9 4.2 4.4  --  4.4  --   --   --   --    < > = values in this interval not displayed.    Liver Function Tests: Recent Labs  Lab 03/19/24 1038 03/19/24 1918 03/20/24 0803 03/20/24 1258 03/20/24 1753 03/21/24 0030 03/21/24 0640  AST 17  --   --   --   --   --   --   ALT 8  --   --   --   --   --   --   ALKPHOS 76  --   --   --   --   --   --  BILITOT 0.8  --   --   --   --   --   --   PROT 6.2*  --   --   --   --   --   --   ALBUMIN  3.9   < > 3.8 3.9 3.8 3.8 3.8   < > = values in this interval not displayed.   No results for input(s): LIPASE, AMYLASE in the last 168 hours. No results for input(s): AMMONIA in the last 168 hours.  CBC: Recent Labs  Lab 03/19/24 1038 03/20/24 0803  WBC 6.0 5.6  HGB 11.2* 11.0*   HCT 34.6* 32.9*  MCV 76.9* 74.4*  PLT 177 166    Cardiac Enzymes: No results for input(s): CKTOTAL, CKMB, CKMBINDEX, TROPONINI in the last 168 hours.  BNP: BNP (last 3 results) Recent Labs    01/19/24 0721 02/01/24 1118 02/09/24 0921  BNP 1,161.0* 941.8* 1,871.3*    ProBNP (last 3 results) Recent Labs    03/19/24 1038 03/25/24 1249  PROBNP 19,467.0* 20,964.0*     CBG: Recent Labs  Lab 03/23/24 1203 03/23/24 1621 03/23/24 2030 03/24/24 0825 03/24/24 1155  GLUCAP 212* 160* 178* 127* 208*    Coagulation Studies: No results for input(s): LABPROT, INR in the last 72 hours.  Imaging: No results found.   Patient Profile   73 y.o. male with history of chronic biventricular heart failure/ICM, CAD, PAF, CKD IIIb, DM II.  Recently admitted to [facility] for a/c CHF. Diuresed with IV lasix  and discharged. Now being readmitted for management of acute on chronic CHF with IV diuretics and inotropic support.  Assessment/Plan  1. Acute on chronic biventricular heart failure: Ischemic cardiomyopathy.  Echo (5/23) with EF 30-35%, severe LV dilation, mild Jermaine, low normal RV systolic function. He has a Medtronic ICD and a cardiac contractility modulator. Echo in 7/24 showed EF 20-25% with mild RV dysfunction.  Echo in 11/25 (done at [facility] so cannot review) showed EF 25%, mild RV dysfunction, moderate Jermaine.  He had a RHC in 10/25 with markedly low cardiac output and markedly high right and left heart filling pressures.  GDMT titration has been complicated by CKD stage 3b and soft BP.  He has RBBB-like IVCD, have discussed with EP and unlikely to benefit from CRT upgrade.  He was just discharged from a hospitalization for CHF exacerbation.  He is still markedly volume overloaded with NYHA class IIIb-IV symptoms.   - Start milrinone  0.25 mcg/kg/min - Place PICC to monitor CVP and Co-ox - Give *** IV lasix  followed by lasix  gtt at ***. - Continue spironolactone  25 mg  daily.  - Off losartan  with low BP (on midodrine ).  - Continue midodrine  for now, hopefully can wean off this.  - Off beta blocker with low output.  - He had a severe groin yeast infection shortly after starting Jardiance so will not rechallenge with SGLT2 inhibitor.  - With markedly low cardiac output on RHC in 10/25, we need to at least consider advance therapies (LVAD).  He is already on midodrine  to maintain his BP.  The main barriers that I see here are his kidney function and possibly RV dysfunction with low PAPi on recent RHC.  He will need repeat RHC after diuresis.  We will need to see how he responds to milrinone .  If creatinine improves with diuresis + milrinone  and PAPi appears reasonable, he may be a candidate for LVAD.   2. CAD: s/p CABG x 3 in 10/22. No exertional  chest pain.  - Continue atorvastatin  80 mg daily.  - No ASA given stable CAD and apixaban  use.   3. CKD: stage 3b.  Follows with nephrology.  Last creatinine 2.12.   - BMET/BNP today.   4. Atrial fibrillation: Paroxysmal.  Device interrogation 11/24 shows occasional AF runs, generally short.  He is now off amiodarone .  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    Tynesha Free N, PA-C 03/25/2024, 2:31 PM Total Time Spent **** Advanced Heart Failure Team Pager [phone] (M-F; 7a - 5p)  Please contact [facility] for night-coverage after hours (4p -7a ) and weekends on [email or web address]

## 2024-03-25 NOTE — Progress Notes (Signed)
 PCP: Perri Constance Sor, PA-C Cardiology: [facility] HF Cardiology: Dr. Rolan  Chief complaint: CHF  73 y.o. with history of CAD, paroxysmal atrial fibrillation, CKD, and ischemic cardiomyopathy presents for followup of CHF.  Patient had CABG x 3 in 10/22. LV function has remained low since that time, ischemic CMP.  Echo in 5/23 showed EF 30-35%, severe LV dilation, mild MR, low normal RV systolic function. He has a Medtronic ICD. He has paroxysmal atrial fibrillation but is generally in NSR.  Amiodarone  has been stopped.  He has struggled with volume overload and has been given Furoscix  from this clinic at times.  This is complicated by CKD stage 3b. He follows with nephrology, Dr. Marcelino.   Patient was admitted in 2/24 with LLQ abdominal pain.  CT showed mesenteric pancolitis.   Echo in 7/24 showed EF 20-25% with mild RV dysfunction.  He had cardiac contractility modulator placed in 5/24 by Dr. Cindie.  Echo in 11/25 (done at [facility] so I cannot review) showed EF 25%, mild RV dysfunction, moderate MR.   Mr Vanvranken has done poorly recently.  I have not seen him since 2/24.  He had RHC done through [facility] clinic in 10/25 showing mean RA 22, PA 54/29, mean PCWP 27, and CI 1.39.  He went to the ER at [facility] in 11/25 due to peripheral edema/volume overload with NYHA class IIIb-IV symptoms.  He was admitted from 11/19-24, received IV Lasix .  Creatinine was 2.12 on the day of discharge.  He is now on midodrine  for low BP.  He presents to the office today after being discharged yesterday.  He has an ongoing persistent cough. He is short of breath just walking around the house.  Very short of breath dressing and bathing.  It was hard to get him into the office today.  3 pillow orthopnea.  He says that his abdomen is less swollen than before but is still distended.  Very short of breath with any incline. Feels like he still has a lot of fluid. No chest pain.   ECG (personally  reviewed): NSR, RBBB, PVC  Medtronic device interrogation: 2% v-paced, short AF runs noted.  Fluid index has been < threshold on Optivol but suspect not accurate.    Labs (1/24): BNP 1106, K 4.7, creatinine 1.98 Labs (2/24): K 4.3, creatinine 2.13 Labs (11/25): K 3.7, creatinine 2.12, pro-BNP 19467.   PMH: 1. ANA/anti-dsDNA positive: No other findings of rheumatological disease.  2. Type 2 diabetes 3. CAD: s/p CABG 10/22 with LIMA-LAD, SVG-ramus, SVG-PDA.   4. Atrial fibrillation: Paroxysmal 5. OSA 6. HTN 7. H/o CVA 8. CKD stage 3 9. Ischemic cardiomyopathy: Echo (5/23) with EF 30-35%, severe LV dilation, mild MR, low normal RV systolic function. He has a Medtronic ICD and a cardiac contractility modulator.  - Severe groin yeast infection on Jardiance.  - Echo (7/24): EF 20-25%, mild RV dysfunction.  - RHC (10/25): mean RA 22, PA 54/29, mean PCWP 27, and CI 1.39. - Echo (11/25): EF 25%, mild RV dysfunction, moderate MR.  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  Social History   Socioeconomic History   Marital status: Married    Spouse name: Not on file   Number of children: Not on file   Years of education: Not on file   Highest education level: Not on file  Occupational History   Not on file  Tobacco Use   Smoking status: Never   Smokeless tobacco: Never  Vaping Use   Vaping status:  Never Used  Substance and Sexual Activity   Alcohol use: Not Currently   Drug use: No   Sexual activity: Not Currently  Other Topics Concern   Not on file  Social History Narrative   Not on file   Social Drivers of Health   Financial Resource Strain: Low Risk  (03/12/2024)   Received from [facility]   Overall Financial Resource Strain (CARDIA)    Difficulty of Paying Living Expenses: Not hard at all  Food Insecurity: No Food Insecurity (03/20/2024)   Hunger Vital Sign    Worried About Running Out of Food in the Last Year: Never true    Ran Out of Food in the Last Year: Never true   Transportation Needs: No Transportation Needs (03/20/2024)   PRAPARE - Administrator, Civil Service (Medical): No    Lack of Transportation (Non-Medical): No  Physical Activity: Not on file  Stress: Not on file  Social Connections: Moderately Integrated (03/20/2024)   Social Connection and Isolation Panel    Frequency of Communication with Friends and Family: More than three times a week    Frequency of Social Gatherings with Friends and Family: More than three times a week    Attends Religious Services: More than 4 times per year    Active Member of [employer] or Organizations: No    Attends Banker Meetings: Never    Marital Status: Married  Catering Manager Violence: Not At Risk (03/21/2024)   Humiliation, Afraid, Rape, and Kick questionnaire    Fear of Current or Ex-Partner: No    Emotionally Abused: No    Physically Abused: No    Sexually Abused: No   Family History  Problem Relation Age of Onset   Parkinson's disease Mother    Lupus Mother    Heart disease Father    ROS: All systems reviewed and negative except as per HPI.   Current Outpatient Medications  Medication Sig Dispense Refill   albuterol  (VENTOLIN  HFA) 108 (90 Base) MCG/ACT inhaler Inhale 2 puffs into the lungs every 6 (six) hours as needed for wheezing or shortness of breath. 8.5 g 2   apixaban  (ELIQUIS ) 5 MG TABS tablet Take 1 tablet (5 mg total) by mouth 2 (two) times daily. 180 tablet 3   atorvastatin  (LIPITOR ) 80 MG tablet Take 1 tablet (80 mg total) by mouth daily. 30 tablet 5   bumetanide  (BUMEX ) 2 MG tablet Take 1 tablet (2 mg total) by mouth daily. 30 tablet 5   calcitRIOL  (ROCALTROL ) 0.25 MCG capsule Take 0.25 mcg by mouth daily.     Continuous Glucose Sensor (FREESTYLE LIBRE 2 SENSOR) MISC by Does not apply route.     Insulin  Pen Needle (COMFORT EZ PEN NEEDLES) 32G X 8 MM MISC by Other route.     metolazone  (ZAROXOLYN ) 2.5 MG tablet Take 2.5 mg by mouth once a week. Takes on  Fridays     midodrine  (PROAMATINE ) 5 MG tablet Take 1 tablet (5 mg total) by mouth 3 (three) times daily with meals. 270 tablet 1   omeprazole (PRILOSEC) 40 MG capsule Take 40 mg by mouth 2 (two) times daily.     ondansetron  (ZOFRAN -ODT) 4 MG disintegrating tablet Take 1 tablet (4 mg total) by mouth every 8 (eight) hours as needed for nausea or vomiting. 12 tablet 0   spironolactone  (ALDACTONE ) 25 MG tablet Take 1 tablet (25 mg total) by mouth daily.     tamsulosin  (FLOMAX ) 0.4 MG CAPS capsule  Take 1 capsule (0.4 mg total) by mouth daily after supper.     [Paused] metFORMIN  (GLUCOPHAGE ) 500 MG tablet Take 500 mg by mouth 2 (two) times daily. (Patient not taking: Reported on 03/25/2024)     [Paused] metoprolol  succinate (TOPROL -XL) 25 MG 24 hr tablet TAKE 1/2 TABLET BY MOUTH EVERY EVENING (Patient not taking: Reported on 03/25/2024) 45 tablet 3   No current facility-administered medications for this visit.   BP 97/65   Pulse 88   Wt 187 lb 8 oz (85 kg)   SpO2 97%   BMI 31.20 kg/m  General: Mild tachypnea Neck: JVP 16 cm, no thyromegaly or thyroid nodule.  Lungs: Clear to auscultation bilaterally with normal respiratory effort. CV: Lateral PMI.  Heart regular S1/S2, no S3/S4, no murmur.  1+ edema to thighs.  No carotid bruit.  Difficult to palpate pedal pulses.  Abdomen: Soft, nontender, no hepatosplenomegaly, moderate distention.  Skin: Intact without lesions or rashes.  Neurologic: Alert and oriented x 3.  Psych: Normal affect. Extremities: No clubbing or cyanosis.  HEENT: Normal.   Assessment/Plan: 1. CAD: s/p CABG x 3 in 10/22. No exertional chest pain.  - Continue atorvastatin  80 mg daily.  - No ASA given stable CAD and apixaban  use.  2. Acute on chronic systolic CHF: Ischemic cardiomyopathy.  Echo (5/23) with EF 30-35%, severe LV dilation, mild MR, low normal RV systolic function. He has a Medtronic ICD and a cardiac contractility modulator. Echo in 7/24 showed EF 20-25% with mild  RV dysfunction.  Echo in 11/25 (done at [facility] so I cannot review) showed EF 25%, mild RV dysfunction, moderate MR.  He had a RHC in 10/25 with markedly low cardiac output and markedly high right and left heart filling pressures.  GDMT titration has been complicated by CKD stage 3b and soft BP.  He has RBBB-like IVCD, have discussed with EP and unlikely to benefit from CRT upgrade.  He was just discharged from a hospitalization for CHF exacerbation.  On exam, he is still markedly volume overloaded with NYHA class IIIb-IV symptoms.  I spoke with the patient and his family about re-admission for full diuresis and inotropic support.  He feels like he needs to go back into the hospital.  - [facility].  - Given low output HF and CKD stage III, I do not think that we will be able to effectively diurese him without inotropic support.  I would like him to get a PICC line at admission for monitoring CVP and co-ox and start him on milrinone  0.25 mcg/kg/min. He likely will need at least po amiodarone  to maintain NSR while on milrinone .  - With markedly low cardiac output on RHC in 10/25, we need to at least consider advance therapies (LVAD).  He is already on midodrine  to maintain his BP.  The main barriers that I see here are his kidney function and possibly RV dysfunction with low PAPi on recent RHC.  He will need repeat RHC after diuresis.  We will need to see how he responds to milrinone .  If creatinine improves with diuresis + milrinone  and PAPi appears reasonable, he may be a candidate for LVAD.  - He will take his home bumetanide  today but will start on Lasix  gtt tomorrow when admitted.  - Continue spironolactone  25 mg daily.  - Off losartan  with low BP (on midodrine ).  - Continue midodrine  for now, hopefully can wean off this.  - Off beta  blocker with low output.  - He had a severe groin yeast infection shortly after starting Jardiance so will not  rechallenge with SGLT2 inhibitor.  3. CKD: stage 3b.  Follows with nephrology.  Last creatinine 2.12.   - BMET/BNP today.  4. Atrial fibrillation: Paroxysmal.  He is in NSR today, device interrogation shows occasional AF runs, generally short.  He is now off amiodarone .  - Continue apixaban   - Would consider starting amiodarone  when he is admitted tomorrow to maintain NSR.  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   As above, plan to direct admit patient to [facility] tomorrow for diuresis and inotropic support.   I spent 42 minutes reviewing records, interviewing/examining patient, and managing orders.   Ezra Shuck 03/25/2024

## 2024-03-25 NOTE — Patient Instructions (Signed)
 Medication Changes:  No medication changes today!  Lab Work:   Go downstairs to [place] on [address] to have your blood work completed.  We will only call you if the results are abnormal or if the provider would like to make medication changes.  No news is good news.'    Follow-Up in: Please report to [facility], Entrance A tomorrow 03/26/24. They will be calling you tomorrow to let you know when you can report to ADMITTING.    Thank you for choosing [facility].    At the [facility], you and your health needs are our priority. We have a designated team specialized in the treatment of Heart Failure. This Care Team includes your primary Heart Failure Specialized Cardiologist (physician), Advanced Practice Providers (APPs- Physician Assistants and Nurse Practitioners), and Pharmacist who all work together to provide you with the care you need, when you need it.   You may see any of the following providers on your designated Care Team at your next follow up:  Dr. Toribio Fuel Dr. Ezra Shuck Dr. Ria Commander Dr. Morene Brownie Ellouise Class, FNP Jaun Bash, RPH-CPP  Please be sure to bring in all your medications bottles to every appointment.   Need to Contact Us :  If you have any questions or concerns before your next appointment please send us  a message through [place] or call our office at [phone].    TO LEAVE A MESSAGE FOR THE NURSE SELECT OPTION 2, PLEASE LEAVE A MESSAGE INCLUDING: YOUR NAME DATE OF BIRTH CALL BACK NUMBER REASON FOR CALL**this is important as we prioritize the call backs  YOU WILL RECEIVE A CALL BACK THE SAME DAY AS LONG AS YOU CALL BEFORE 4:00 PM

## 2024-03-26 ENCOUNTER — Other Ambulatory Visit: Payer: Self-pay

## 2024-03-26 ENCOUNTER — Inpatient Hospital Stay (HOSPITAL_COMMUNITY)
Admission: AD | Admit: 2024-03-26 | Discharge: 2024-05-01 | DRG: 291 | Disposition: E | Attending: Internal Medicine | Admitting: Internal Medicine

## 2024-03-26 DIAGNOSIS — N189 Chronic kidney disease, unspecified: Secondary | ICD-10-CM | POA: Diagnosis not present

## 2024-03-26 DIAGNOSIS — Z515 Encounter for palliative care: Secondary | ICD-10-CM | POA: Diagnosis not present

## 2024-03-26 DIAGNOSIS — I469 Cardiac arrest, cause unspecified: Secondary | ICD-10-CM | POA: Diagnosis not present

## 2024-03-26 DIAGNOSIS — I255 Ischemic cardiomyopathy: Secondary | ICD-10-CM | POA: Diagnosis present

## 2024-03-26 DIAGNOSIS — E1122 Type 2 diabetes mellitus with diabetic chronic kidney disease: Secondary | ICD-10-CM | POA: Diagnosis present

## 2024-03-26 DIAGNOSIS — R570 Cardiogenic shock: Secondary | ICD-10-CM | POA: Diagnosis present

## 2024-03-26 DIAGNOSIS — I5023 Acute on chronic systolic (congestive) heart failure: Principal | ICD-10-CM

## 2024-03-26 DIAGNOSIS — N184 Chronic kidney disease, stage 4 (severe): Secondary | ICD-10-CM | POA: Diagnosis present

## 2024-03-26 DIAGNOSIS — G4700 Insomnia, unspecified: Secondary | ICD-10-CM | POA: Diagnosis present

## 2024-03-26 DIAGNOSIS — I2510 Atherosclerotic heart disease of native coronary artery without angina pectoris: Secondary | ICD-10-CM | POA: Diagnosis not present

## 2024-03-26 DIAGNOSIS — Z7984 Long term (current) use of oral hypoglycemic drugs: Secondary | ICD-10-CM | POA: Diagnosis not present

## 2024-03-26 DIAGNOSIS — I5082 Biventricular heart failure: Secondary | ICD-10-CM | POA: Diagnosis present

## 2024-03-26 DIAGNOSIS — Z951 Presence of aortocoronary bypass graft: Secondary | ICD-10-CM | POA: Diagnosis not present

## 2024-03-26 DIAGNOSIS — E871 Hypo-osmolality and hyponatremia: Secondary | ICD-10-CM | POA: Diagnosis present

## 2024-03-26 DIAGNOSIS — Z8249 Family history of ischemic heart disease and other diseases of the circulatory system: Secondary | ICD-10-CM | POA: Diagnosis not present

## 2024-03-26 DIAGNOSIS — I5084 End stage heart failure: Secondary | ICD-10-CM | POA: Diagnosis present

## 2024-03-26 DIAGNOSIS — G4701 Insomnia due to medical condition: Secondary | ICD-10-CM | POA: Diagnosis not present

## 2024-03-26 DIAGNOSIS — Z7189 Other specified counseling: Secondary | ICD-10-CM | POA: Diagnosis not present

## 2024-03-26 DIAGNOSIS — Z95810 Presence of automatic (implantable) cardiac defibrillator: Secondary | ICD-10-CM | POA: Diagnosis not present

## 2024-03-26 DIAGNOSIS — I4891 Unspecified atrial fibrillation: Secondary | ICD-10-CM | POA: Diagnosis not present

## 2024-03-26 DIAGNOSIS — E7800 Pure hypercholesterolemia, unspecified: Secondary | ICD-10-CM | POA: Diagnosis present

## 2024-03-26 DIAGNOSIS — Z789 Other specified health status: Secondary | ICD-10-CM | POA: Diagnosis not present

## 2024-03-26 DIAGNOSIS — I340 Nonrheumatic mitral (valve) insufficiency: Secondary | ICD-10-CM | POA: Diagnosis present

## 2024-03-26 DIAGNOSIS — E876 Hypokalemia: Secondary | ICD-10-CM | POA: Diagnosis not present

## 2024-03-26 DIAGNOSIS — I130 Hypertensive heart and chronic kidney disease with heart failure and stage 1 through stage 4 chronic kidney disease, or unspecified chronic kidney disease: Secondary | ICD-10-CM | POA: Diagnosis present

## 2024-03-26 DIAGNOSIS — N1832 Chronic kidney disease, stage 3b: Secondary | ICD-10-CM

## 2024-03-26 DIAGNOSIS — K5900 Constipation, unspecified: Secondary | ICD-10-CM | POA: Diagnosis present

## 2024-03-26 DIAGNOSIS — I480 Paroxysmal atrial fibrillation: Secondary | ICD-10-CM | POA: Diagnosis present

## 2024-03-26 DIAGNOSIS — Z7901 Long term (current) use of anticoagulants: Secondary | ICD-10-CM | POA: Diagnosis not present

## 2024-03-26 DIAGNOSIS — Z66 Do not resuscitate: Secondary | ICD-10-CM | POA: Diagnosis not present

## 2024-03-26 DIAGNOSIS — I5021 Acute systolic (congestive) heart failure: Secondary | ICD-10-CM | POA: Diagnosis not present

## 2024-03-26 DIAGNOSIS — Z79899 Other long term (current) drug therapy: Secondary | ICD-10-CM | POA: Diagnosis not present

## 2024-03-26 LAB — CBC WITH DIFFERENTIAL/PLATELET
Abs Immature Granulocytes: 0.03 K/uL (ref 0.00–0.07)
Basophils Absolute: 0.1 K/uL (ref 0.0–0.1)
Basophils Relative: 1 %
Eosinophils Absolute: 0.3 K/uL (ref 0.0–0.5)
Eosinophils Relative: 5 %
HCT: 34.9 % — ABNORMAL LOW (ref 39.0–52.0)
Hemoglobin: 11.2 g/dL — ABNORMAL LOW (ref 13.0–17.0)
Immature Granulocytes: 1 %
Lymphocytes Relative: 8 %
Lymphs Abs: 0.5 K/uL — ABNORMAL LOW (ref 0.7–4.0)
MCH: 24.5 pg — ABNORMAL LOW (ref 26.0–34.0)
MCHC: 32.1 g/dL (ref 30.0–36.0)
MCV: 76.4 fL — ABNORMAL LOW (ref 80.0–100.0)
Monocytes Absolute: 0.7 K/uL (ref 0.1–1.0)
Monocytes Relative: 10 %
Neutro Abs: 4.9 K/uL (ref 1.7–7.7)
Neutrophils Relative %: 75 %
Platelets: 172 K/uL (ref 150–400)
RBC: 4.57 MIL/uL (ref 4.22–5.81)
RDW: 16.3 % — ABNORMAL HIGH (ref 11.5–15.5)
WBC: 6.5 K/uL (ref 4.0–10.5)
nRBC: 0 % (ref 0.0–0.2)

## 2024-03-26 LAB — GLUCOSE, CAPILLARY: Glucose-Capillary: 172 mg/dL — ABNORMAL HIGH (ref 70–99)

## 2024-03-26 LAB — COMPREHENSIVE METABOLIC PANEL WITH GFR
ALT: 11 U/L (ref 0–44)
AST: 19 U/L (ref 15–41)
Albumin: 3.7 g/dL (ref 3.5–5.0)
Alkaline Phosphatase: 72 U/L (ref 38–126)
Anion gap: 11 (ref 5–15)
BUN: 46 mg/dL — ABNORMAL HIGH (ref 8–23)
CO2: 29 mmol/L (ref 22–32)
Calcium: 8.9 mg/dL (ref 8.9–10.3)
Chloride: 84 mmol/L — ABNORMAL LOW (ref 98–111)
Creatinine, Ser: 2.76 mg/dL — ABNORMAL HIGH (ref 0.61–1.24)
GFR, Estimated: 23 mL/min — ABNORMAL LOW (ref >60)
Glucose, Bld: 179 mg/dL — ABNORMAL HIGH (ref 70–99)
Potassium: 3.8 mmol/L (ref 3.5–5.1)
Sodium: 124 mmol/L — ABNORMAL LOW (ref 135–145)
Total Bilirubin: 1.2 mg/dL (ref 0.0–1.2)
Total Protein: 6.7 g/dL (ref 6.5–8.1)

## 2024-03-26 LAB — BRAIN NATRIURETIC PEPTIDE: B Natriuretic Peptide: 2129.6 pg/mL — ABNORMAL HIGH (ref 0.0–100.0)

## 2024-03-26 MED ORDER — SODIUM CHLORIDE 0.9% FLUSH: 3.0000 mL | INTRAVENOUS | Status: DC | PRN

## 2024-03-26 MED ORDER — FUROSEMIDE 10 MG/ML IJ SOLN
30.0000 mg/h | INTRAVENOUS | Status: DC
  Administered 2024-03-26 – 2024-03-27 (×4): 20 mg/h via INTRAVENOUS
  Administered 2024-03-28 – 2024-04-04 (×24): 30 mg/h via INTRAVENOUS
  Filled 2024-03-26: qty 20
  Filled 2024-03-26: qty 200
  Filled 2024-03-26 (×7): qty 20
  Filled 2024-03-26: qty 200
  Filled 2024-03-26 (×2): qty 20
  Filled 2024-03-26 (×3): qty 200
  Filled 2024-03-26 (×7): qty 20
  Filled 2024-03-26: qty 200
  Filled 2024-03-26 (×2): qty 20
  Filled 2024-03-26: qty 200
  Filled 2024-03-26 (×4): qty 20

## 2024-03-26 MED ORDER — MILRINONE LACTATE IN DEXTROSE 20-5 MG/100ML-% IV SOLN
0.2500 ug/kg/min | INTRAVENOUS | Status: DC
  Administered 2024-03-26 – 2024-03-29 (×5): 0.25 ug/kg/min via INTRAVENOUS
  Filled 2024-03-26 (×5): qty 100

## 2024-03-26 MED ORDER — TAMSULOSIN HCL 0.4 MG PO CAPS
0.4000 mg | ORAL_CAPSULE | Freq: Every day | ORAL | Status: DC
  Administered 2024-03-26 – 2024-04-03 (×9): 0.4 mg via ORAL
  Filled 2024-03-26 (×9): qty 1

## 2024-03-26 MED ORDER — PANTOPRAZOLE SODIUM 40 MG PO TBEC
80.0000 mg | DELAYED_RELEASE_TABLET | Freq: Every day | ORAL | Status: DC
  Administered 2024-03-26 – 2024-04-04 (×10): 80 mg via ORAL
  Filled 2024-03-26 (×10): qty 2

## 2024-03-26 MED ORDER — SODIUM CHLORIDE 0.9 % IV SOLN: 250.0000 mL | INTRAVENOUS | Status: AC | PRN

## 2024-03-26 MED ORDER — ONDANSETRON HCL 4 MG/2ML IJ SOLN
4.0000 mg | Freq: Four times a day (QID) | INTRAMUSCULAR | Status: DC | PRN
  Administered 2024-03-29 – 2024-04-04 (×8): 4 mg via INTRAVENOUS
  Filled 2024-03-26 (×8): qty 2

## 2024-03-26 MED ORDER — FUROSEMIDE 10 MG/ML IJ SOLN
120.0000 mg | Freq: Once | INTRAVENOUS | Status: AC
  Administered 2024-03-26: 120 mg via INTRAVENOUS
  Filled 2024-03-26: qty 10

## 2024-03-26 MED ORDER — INSULIN ASPART 100 UNIT/ML IJ SOLN
0.0000 [IU] | Freq: Three times a day (TID) | INTRAMUSCULAR | Status: DC
  Administered 2024-03-27: 2 [IU] via SUBCUTANEOUS
  Administered 2024-03-27: 3 [IU] via SUBCUTANEOUS
  Administered 2024-03-27: 2 [IU] via SUBCUTANEOUS
  Administered 2024-03-28: 3 [IU] via SUBCUTANEOUS
  Administered 2024-03-28 – 2024-03-29 (×3): 2 [IU] via SUBCUTANEOUS
  Administered 2024-03-29 – 2024-03-30 (×5): 3 [IU] via SUBCUTANEOUS
  Administered 2024-03-31: 1 [IU] via SUBCUTANEOUS
  Administered 2024-03-31: 2 [IU] via SUBCUTANEOUS
  Administered 2024-03-31: 5 [IU] via SUBCUTANEOUS
  Administered 2024-04-01: 2 [IU] via SUBCUTANEOUS
  Administered 2024-04-01 (×2): 3 [IU] via SUBCUTANEOUS
  Administered 2024-04-02 – 2024-04-03 (×4): 2 [IU] via SUBCUTANEOUS
  Administered 2024-04-03: 3 [IU] via SUBCUTANEOUS
  Administered 2024-04-04: 1 [IU] via SUBCUTANEOUS
  Filled 2024-03-26 (×2): qty 3
  Filled 2024-03-26 (×6): qty 1
  Filled 2024-03-26: qty 2
  Filled 2024-03-26 (×3): qty 1
  Filled 2024-03-26: qty 3
  Filled 2024-03-26: qty 1
  Filled 2024-03-26: qty 3
  Filled 2024-03-26 (×3): qty 1
  Filled 2024-03-26: qty 3
  Filled 2024-03-26: qty 1
  Filled 2024-03-26: qty 2
  Filled 2024-03-26: qty 3
  Filled 2024-03-26: qty 5

## 2024-03-26 MED ORDER — AMIODARONE HCL IN DEXTROSE 360-4.14 MG/200ML-% IV SOLN
30.0000 mg/h | INTRAVENOUS | Status: DC
  Administered 2024-03-26 – 2024-04-04 (×18): 30 mg/h via INTRAVENOUS
  Filled 2024-03-26 (×18): qty 200

## 2024-03-26 MED ORDER — POTASSIUM CHLORIDE CRYS ER 20 MEQ PO TBCR
40.0000 meq | EXTENDED_RELEASE_TABLET | ORAL | Status: AC
  Administered 2024-03-26: 40 meq via ORAL
  Filled 2024-03-26: qty 2

## 2024-03-26 MED ORDER — ALBUTEROL SULFATE (2.5 MG/3ML) 0.083% IN NEBU
3.0000 mL | INHALATION_SOLUTION | Freq: Four times a day (QID) | RESPIRATORY_TRACT | Status: DC | PRN
  Administered 2024-03-26 – 2024-04-04 (×10): 3 mL via RESPIRATORY_TRACT
  Filled 2024-03-26 (×10): qty 3

## 2024-03-26 MED ORDER — ACETAMINOPHEN 325 MG PO TABS
650.0000 mg | ORAL_TABLET | ORAL | Status: DC | PRN
  Administered 2024-03-26 – 2024-03-27 (×2): 650 mg via ORAL
  Filled 2024-03-26 (×2): qty 2

## 2024-03-26 MED ORDER — ATORVASTATIN CALCIUM 80 MG PO TABS
80.0000 mg | ORAL_TABLET | Freq: Every day | ORAL | Status: DC
  Administered 2024-03-27 – 2024-04-04 (×9): 80 mg via ORAL
  Filled 2024-03-26 (×9): qty 1

## 2024-03-26 MED ORDER — APIXABAN 5 MG PO TABS
5.0000 mg | ORAL_TABLET | Freq: Two times a day (BID) | ORAL | Status: DC
  Administered 2024-03-26 – 2024-04-04 (×18): 5 mg via ORAL
  Filled 2024-03-26 (×18): qty 1

## 2024-03-26 MED ORDER — MELATONIN 3 MG PO TABS
3.0000 mg | ORAL_TABLET | Freq: Every evening | ORAL | Status: DC | PRN
  Administered 2024-03-26 – 2024-04-01 (×7): 3 mg via ORAL
  Filled 2024-03-26 (×7): qty 1

## 2024-03-26 MED ORDER — SODIUM CHLORIDE 0.9% FLUSH
3.0000 mL | Freq: Two times a day (BID) | INTRAVENOUS | Status: DC
  Administered 2024-03-26 – 2024-04-04 (×14): 3 mL via INTRAVENOUS

## 2024-03-26 NOTE — Progress Notes (Signed)
 Orthopedic Tech Progress Note Patient Details:  Jermaine Kramer [birth year] [id number]  Ortho Devices Type of Ortho Device: Nonie boot Ortho Device/Splint Location: Bilateral Ortho Device/Splint Interventions: Ordered, Application, Adjustment   Post Interventions Patient Tolerated: Well  Jermaine Kramer 03/26/2024, 4:06 PM

## 2024-03-27 ENCOUNTER — Inpatient Hospital Stay (HOSPITAL_COMMUNITY)

## 2024-03-27 DIAGNOSIS — I5023 Acute on chronic systolic (congestive) heart failure: Secondary | ICD-10-CM | POA: Diagnosis not present

## 2024-03-27 DIAGNOSIS — I5021 Acute systolic (congestive) heart failure: Secondary | ICD-10-CM

## 2024-03-27 LAB — GLUCOSE, CAPILLARY
Glucose-Capillary: 177 mg/dL — ABNORMAL HIGH (ref 70–99)
Glucose-Capillary: 217 mg/dL — ABNORMAL HIGH (ref 70–99)
Glucose-Capillary: 199 mg/dL — ABNORMAL HIGH (ref 70–99)
Glucose-Capillary: 177 mg/dL — ABNORMAL HIGH (ref 70–99)

## 2024-03-27 LAB — BASIC METABOLIC PANEL WITH GFR
Anion gap: 12 (ref 5–15)
BUN: 45 mg/dL — ABNORMAL HIGH (ref 8–23)
CO2: 28 mmol/L (ref 22–32)
Calcium: 8.8 mg/dL — ABNORMAL LOW (ref 8.9–10.3)
Chloride: 86 mmol/L — ABNORMAL LOW (ref 98–111)
Creatinine, Ser: 2.52 mg/dL — ABNORMAL HIGH (ref 0.61–1.24)
GFR, Estimated: 26 mL/min — ABNORMAL LOW (ref >60)
Glucose, Bld: 168 mg/dL — ABNORMAL HIGH (ref 70–99)
Potassium: 3.8 mmol/L (ref 3.5–5.1)
Sodium: 126 mmol/L — ABNORMAL LOW (ref 135–145)

## 2024-03-27 LAB — MAGNESIUM: Magnesium: 1.9 mg/dL (ref 1.7–2.4)

## 2024-03-27 LAB — ECHOCARDIOGRAM COMPLETE
AR max vel: 1.85 cm2
AV Area VTI: 2.23 cm2
AV Area mean vel: 1.97 cm2
AV Mean grad: 5 mmHg
AV Peak grad: 8.9 mmHg
Ao pk vel: 1.49 m/s
Area-P 1/2: 4.33 cm2
Est EF: <20
Height: 65 in
S' Lateral: 4.8 cm
Weight: 2932.8 [oz_av]

## 2024-03-27 LAB — TSH: TSH: 2.527 u[IU]/mL (ref 0.350–4.500)

## 2024-03-27 MED ORDER — PERFLUTREN LIPID MICROSPHERE
1.0000 mL | INTRAVENOUS | Status: AC | PRN
  Administered 2024-03-27: 2 mL via INTRAVENOUS

## 2024-03-27 MED ORDER — METHOCARBAMOL 500 MG PO TABS
500.0000 mg | ORAL_TABLET | Freq: Three times a day (TID) | ORAL | Status: DC | PRN
  Administered 2024-03-27 – 2024-03-29 (×4): 500 mg via ORAL
  Filled 2024-03-27 (×4): qty 1

## 2024-03-27 MED ORDER — POTASSIUM CHLORIDE CRYS ER 20 MEQ PO TBCR
40.0000 meq | EXTENDED_RELEASE_TABLET | Freq: Once | ORAL | Status: AC
  Administered 2024-03-27: 40 meq via ORAL
  Filled 2024-03-27: qty 2

## 2024-03-27 MED ORDER — DM-GUAIFENESIN ER 30-600 MG PO TB12
1.0000 | ORAL_TABLET | Freq: Two times a day (BID) | ORAL | Status: DC | PRN
  Administered 2024-03-27 – 2024-04-04 (×9): 1 via ORAL
  Filled 2024-03-27 (×9): qty 1

## 2024-03-27 MED ORDER — MAGNESIUM SULFATE 2 GM/50ML IV SOLN
2.0000 g | Freq: Once | INTRAVENOUS | Status: AC
  Administered 2024-03-27: 2 g via INTRAVENOUS
  Filled 2024-03-27: qty 50

## 2024-03-27 NOTE — Plan of Care (Signed)
   Problem: Health Behavior/Discharge Planning: Goal: Ability to manage health-related needs will improve Outcome: Progressing   Problem: Clinical Measurements: Goal: Will remain free from infection Outcome: Progressing Goal: Diagnostic test results will improve Outcome: Progressing

## 2024-03-27 NOTE — Progress Notes (Signed)
 Advanced Heart Failure Rounding Note  Cardiologist: None  Chief Complaint: HF Subjective:    Remains on milrinone  0.25. Diuresing well with IV lasix . Out 3L  Didn't sleep well due to leg cramps.   Scr 2.7-> 2.5   Objective:   Weight Range: 83.1 kg Body mass index is 30.5 kg/m.   Vital Signs:   Temp:  [97.5 F (36.4 C)-98.7 F (37.1 C)] 97.7 F (36.5 C) (11/27 1104) Pulse Rate:  [76-110] 76 (11/27 1104) Resp:  [16-18] 16 (11/27 1104) BP: (93-108)/(63-76) 108/70 (11/27 1104) SpO2:  [95 %-98 %] 98 % (11/27 1104) Weight:  [83.1 kg-83.6 kg] 83.1 kg (11/27 0358) Last BM Date : 03/26/24  Weight change: Filed Weights   03/26/24 1459 03/27/24 0358  Weight: 83.6 kg 83.1 kg    Intake/Output:   Intake/Output Summary (Last 24 hours) at 03/27/2024 1241 Last data filed at 03/27/2024 1239 Gross per 24 hour  Intake 120 ml  Output 3125 ml  Net -3005 ml      Physical Exam    General:  Sitting up in bed. No resp difficulty HEENT: normal Neck: supple. JVP to jaw  Cor: Regular rate & rhythm. No rubs, gallops or murmurs. Lungs: clear Abdomen: soft, nontender, + distended.Good bowel sounds. Extremities: no cyanosis, clubbing, rash, 2+ edema Neuro: alert & orientedx3, cranial nerves grossly intact. moves all 4 extremities w/o difficulty. Affect pleasant    Telemetry  Sinus 70s Personally reviewed   Labs    CBC Recent Labs    03/26/24 1455  WBC 6.5  NEUTROABS 4.9  HGB 11.2*  HCT 34.9*  MCV 76.4*  PLT 172   Basic Metabolic Panel Recent Labs    88/73/74 1455 03/27/24 0227  NA 124* 126*  K 3.8 3.8  CL 84* 86*  CO2 29 28  GLUCOSE 179* 168*  BUN 46* 45*  CREATININE 2.76* 2.52*  CALCIUM  8.9 8.8*  MG  --  1.9   Liver Function Tests Recent Labs    03/26/24 1455  AST 19  ALT 11  ALKPHOS 72  BILITOT 1.2  PROT 6.7  ALBUMIN  3.7   No results for input(s): LIPASE, AMYLASE in the last 72 hours. Cardiac Enzymes No results for input(s):  CKTOTAL, CKMB, CKMBINDEX, TROPONINI in the last 72 hours.  BNP: BNP (last 3 results) Recent Labs    02/01/24 1118 02/09/24 0921 03/26/24 1455  BNP 941.8* 1,871.3* 2,129.6*    ProBNP (last 3 results) Recent Labs    03/19/24 1038 03/25/24 1249  PROBNP 19,467.0* 20,964.0*     D-Dimer No results for input(s): DDIMER in the last 72 hours. Hemoglobin A1C No results for input(s): HGBA1C in the last 72 hours. Fasting Lipid Panel No results for input(s): CHOL, HDL, LDLCALC, TRIG, CHOLHDL, LDLDIRECT in the last 72 hours. Thyroid Function Tests Recent Labs    03/27/24 0227  TSH 2.527    Other results:   Imaging    US  EKG SITE RITE Result Date: 03/26/2024 If Site Rite image not attached, placement could not be confirmed due to current cardiac rhythm.    Medications:     Scheduled Medications:  apixaban   5 mg Oral BID   atorvastatin   80 mg Oral Daily   insulin  aspart  0-9 Units Subcutaneous TID WC   pantoprazole   80 mg Oral Daily   sodium chloride  flush  3 mL Intravenous Q12H   tamsulosin   0.4 mg Oral QPC supper    Infusions:  sodium chloride   amiodarone  30 mg/hr (03/27/24 0343)   furosemide  (LASIX ) 200 mg in dextrose  5 % 100 mL (2 mg/mL) infusion 20 mg/hr (03/27/24 1204)   milrinone  0.25 mcg/kg/min (03/27/24 0714)    PRN Medications: sodium chloride , acetaminophen , albuterol , melatonin, methocarbamol , ondansetron  (ZOFRAN ) IV, sodium chloride  flush    Assessment/Plan   1. Acute on chronic biventricular heart failure: Ischemic cardiomyopathy.  Echo (5/23) with EF 30-35%, severe LV dilation, mild MR, low normal RV systolic function. He has a Medtronic ICD and a cardiac contractility modulator. Echo in 7/24 showed EF 20-25% with mild RV dysfunction.  Echo in 11/25 (done at [facility] so cannot review) showed EF 25%, mild RV dysfunction, moderate MR.  He had a RHC in 10/25 with markedly low cardiac output and markedly high right  and left heart filling pressures.  GDMT titration has been complicated by CKD stage 3b and soft BP.  He has RBBB-like IVCD, have discussed with EP and unlikely to benefit from CRT upgrade.  He was just discharged from a hospitalization for CHF exacerbation. - Continue milrinone  0.25 mcg/kg/min - Remains volume overloaded. Continue lasix  gtt at 20 - Place PICC to monitor CVP and Co-ox, will need to be with IR (has b/l devices) - planned for tomorrow - Off GDMT with low blood pressure and CKD. On midodrine  at home, will hold off on restarting for now. Restart GDMT as able. Hopefully renal function improves with inotrope support. - Off beta blocker with low output.  - He had a severe groin yeast infection shortly after starting Jardiance so will not rechallenge with SGLT2 inhibitor.  - Repeat echo pending - With markedly low cardiac output on RHC in 10/25, we need to at least consider advance therapies (LVAD).  The main barriers are his kidney function and possibly RV dysfunction with low PAPi on recent RHC.  He will need repeat RHC after diuresis.  We will need to see how he responds to milrinone .  If creatinine improves with diuresis + milrinone  and PAPi appears reasonable, he may be a candidate for LVAD.    2. CAD: s/p CABG x 3 in 10/22. - No s/s angina - Continue atorvastatin  80 mg daily.  - No ASA given stable CAD and apixaban  use.    3. CKD: stage IV.  Follows with nephrology.  Last creatinine 2.3   - Scr 2.7 -> 2.5 with milrinone  and IV diuresis - follow daily   4. Atrial fibrillation: Paroxysmal.  Device interrogation 11/24 shows occasional AF runs, generally short.  He is now off amiodarone .  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 Will need to stop eliquis  and start bival prior to any invasive workup/procedures.   6. Hyponatremia: Limit fluid intake - In setting of massive volume overload  7.  Hypokalemia - supp     Length of Stay: 1  Toribio Fuel, MD  03/27/2024, 12:41 PM  Advanced Heart Failure Team Pager [phone] (M-F; 7a - 5p)  Please contact [facility] for night-coverage after hours (5p -7a ) and weekends on [email or web address]

## 2024-03-27 NOTE — Progress Notes (Signed)
  IR BRIEF PROGRESS NOTE:  IR received request for PICC placement.  PICC team is unable to accommodate as patient has a pacemaker with leads in on left side, and cardiac device with leads on right. Patient has left PIV access. IR cannot accommodate PICC lines on holidays and weekends due to staffing limitation. Care team advised, and is ok with waiting until tomorrow, 11/28, for PICC placement attempt. IR will attempt on 11/28 with local anesthesia only. No NPO required.   Electronically Signed: Carlin DELENA Griffon, PA-C 03/27/2024, 9:27 AM

## 2024-03-28 ENCOUNTER — Inpatient Hospital Stay (HOSPITAL_COMMUNITY)

## 2024-03-28 ENCOUNTER — Encounter (HOSPITAL_COMMUNITY): Payer: Self-pay

## 2024-03-28 DIAGNOSIS — I5023 Acute on chronic systolic (congestive) heart failure: Secondary | ICD-10-CM | POA: Diagnosis not present

## 2024-03-28 HISTORY — PX: IR PATIENT EVAL TECH 0-60 MINS: IMG5564

## 2024-03-28 LAB — BASIC METABOLIC PANEL WITH GFR
Anion gap: 13 (ref 5–15)
BUN: 42 mg/dL — ABNORMAL HIGH (ref 8–23)
CO2: 25 mmol/L (ref 22–32)
Calcium: 8.7 mg/dL — ABNORMAL LOW (ref 8.9–10.3)
Chloride: 86 mmol/L — ABNORMAL LOW (ref 98–111)
Creatinine, Ser: 2.34 mg/dL — ABNORMAL HIGH (ref 0.61–1.24)
GFR, Estimated: 28 mL/min — ABNORMAL LOW (ref >60)
Glucose, Bld: 159 mg/dL — ABNORMAL HIGH (ref 70–99)
Potassium: 4.2 mmol/L (ref 3.5–5.1)
Sodium: 124 mmol/L — ABNORMAL LOW (ref 135–145)

## 2024-03-28 LAB — COOXEMETRY PANEL
Carboxyhemoglobin: 2.3 % — ABNORMAL HIGH (ref 0.5–1.5)
Methemoglobin: 0.8 % (ref 0.0–1.5)
O2 Saturation: 50.7 %
Total hemoglobin: 11 g/dL — ABNORMAL LOW (ref 12.0–16.0)

## 2024-03-28 LAB — MAGNESIUM: Magnesium: 2.1 mg/dL (ref 1.7–2.4)

## 2024-03-28 LAB — GLUCOSE, CAPILLARY
Glucose-Capillary: 163 mg/dL — ABNORMAL HIGH (ref 70–99)
Glucose-Capillary: 246 mg/dL — ABNORMAL HIGH (ref 70–99)
Glucose-Capillary: 191 mg/dL — ABNORMAL HIGH (ref 70–99)

## 2024-03-28 MED ORDER — LIDOCAINE HCL 1 % IJ SOLN
20.0000 mL | Freq: Once | INTRAMUSCULAR | Status: AC
  Administered 2024-03-28: 3 mL
  Filled 2024-03-28: qty 20

## 2024-03-28 MED ORDER — SODIUM CHLORIDE 0.9% FLUSH: 10.0000 mL | INTRAVENOUS | Status: DC | PRN

## 2024-03-28 MED ORDER — CHLORHEXIDINE GLUCONATE CLOTH 2 % EX PADS
6.0000 | MEDICATED_PAD | Freq: Every day | CUTANEOUS | Status: DC
  Administered 2024-03-28 – 2024-04-04 (×8): 6 via TOPICAL

## 2024-03-28 MED ORDER — METOLAZONE 5 MG PO TABS
5.0000 mg | ORAL_TABLET | Freq: Once | ORAL | Status: AC
  Administered 2024-03-28: 5 mg via ORAL
  Filled 2024-03-28: qty 1

## 2024-03-28 MED ORDER — LIDOCAINE HCL 1 % IJ SOLN
INTRAMUSCULAR | Status: AC
Start: 2024-03-28 — End: 2024-03-28
  Filled 2024-03-28: qty 20

## 2024-03-28 MED ORDER — POTASSIUM CHLORIDE CRYS ER 20 MEQ PO TBCR
40.0000 meq | EXTENDED_RELEASE_TABLET | Freq: Once | ORAL | Status: AC
  Administered 2024-03-28: 40 meq via ORAL
  Filled 2024-03-28: qty 2

## 2024-03-28 MED ORDER — SODIUM CHLORIDE 0.9% FLUSH
10.0000 mL | Freq: Two times a day (BID) | INTRAVENOUS | Status: DC
  Administered 2024-03-28: 20 mL
  Administered 2024-03-28 – 2024-03-30 (×4): 10 mL
  Administered 2024-03-30 – 2024-03-31 (×3): 20 mL
  Administered 2024-04-01: 30 mL
  Administered 2024-04-02 – 2024-04-03 (×4): 10 mL

## 2024-03-28 NOTE — Progress Notes (Addendum)
 Advanced Heart Failure Rounding Note  HF Cardiologist: Dr. Rolan  Chief Complaint: HF Subjective:    Remains on 0.25 milrinone . Awaiting PICC line placement with IR.  2.7L UOP last 24 hrs with lasix  gtt at 20 mg/hr. Weight unchanged.  Continues with orthopnea, had a bad coughing fit early am. Little appetite, abdomen feels full.   Objective:   Weight Range: 83.6 kg Body mass index is 30.67 kg/m.   Vital Signs:   Temp:  [97.6 F (36.4 C)-98.6 F (37 C)] 98 F (36.7 C) (11/28 0410) Pulse Rate:  [72-79] 77 (11/28 0410) Resp:  [16-21] 18 (11/28 0410) BP: (93-108)/(63-70) 96/70 (11/28 0410) SpO2:  [93 %-98 %] 95 % (11/27 2318) Weight:  [83.6 kg] 83.6 kg (11/28 0410) Last BM Date : 03/27/24 (per pt)  Weight change: Filed Weights   03/26/24 1459 03/27/24 0358 03/28/24 0410  Weight: 83.6 kg 83.1 kg 83.6 kg    Intake/Output:   Intake/Output Summary (Last 24 hours) at 03/28/2024 0702 Last data filed at 03/28/2024 0414 Gross per 24 hour  Intake 1052.11 ml  Output 2730 ml  Net -1677.89 ml      Physical Exam    General:  Sitting up on side of bed. Cor: Jvp to ear. Regular rate & rhythm. No murmurs. Lungs: clear Abdomen: + distended Extremities: 1+ edema, + UNNA Neuro: alert & orientedx3. Affect pleasant     Telemetry  SR 80s, PVCs and PACs   Labs    CBC Recent Labs    03/26/24 1455  WBC 6.5  NEUTROABS 4.9  HGB 11.2*  HCT 34.9*  MCV 76.4*  PLT 172   Basic Metabolic Panel Recent Labs    88/72/74 0227 03/28/24 0210  NA 126* 124*  K 3.8 4.2  CL 86* 86*  CO2 28 25  GLUCOSE 168* 159*  BUN 45* 42*  CREATININE 2.52* 2.34*  CALCIUM  8.8* 8.7*  MG 1.9 2.1   Liver Function Tests Recent Labs    03/26/24 1455  AST 19  ALT 11  ALKPHOS 72  BILITOT 1.2  PROT 6.7  ALBUMIN  3.7   No results for input(s): LIPASE, AMYLASE in the last 72 hours. Cardiac Enzymes No results for input(s): CKTOTAL, CKMB, CKMBINDEX, TROPONINI in the  last 72 hours.  BNP: BNP (last 3 results) Recent Labs    02/01/24 1118 02/09/24 0921 03/26/24 1455  BNP 941.8* 1,871.3* 2,129.6*    ProBNP (last 3 results) Recent Labs    03/19/24 1038 03/25/24 1249  PROBNP 19,467.0* 20,964.0*     D-Dimer No results for input(s): DDIMER in the last 72 hours. Hemoglobin A1C No results for input(s): HGBA1C in the last 72 hours. Fasting Lipid Panel No results for input(s): CHOL, HDL, LDLCALC, TRIG, CHOLHDL, LDLDIRECT in the last 72 hours. Thyroid Function Tests Recent Labs    03/27/24 0227  TSH 2.527    Other results:   Imaging    ECHOCARDIOGRAM COMPLETE Result Date: 03/27/2024    ECHOCARDIOGRAM REPORT   Patient Name:   Jermaine Kramer Date of Exam: 03/27/2024 Medical Rec #:  [id number]     Height:       65.0 in Accession #:    [id number]    Weight:       183.3 lb Date of Birth:  1951/02/10      BSA:          1.906 m Patient Age:    73 years      BP:  106/70 mmHg Patient Gender: M             HR:           90 bpm. Exam Location:  Inpatient Procedure: 2D Echo, Cardiac Doppler, Color Doppler and Intracardiac            Opacification Agent (Both Spectral and Color Flow Doppler were            utilized during procedure). Indications:    CHF-Acute Systolic I50.21  History:        Patient has prior history of Echocardiogram examinations, most                 recent 06/29/2022. CHF and Cardiomyopathy, CAD, Prior CABG,                 Stroke; Risk Factors:Hypertension, Diabetes and Sleep Apnea.  Sonographer:    Jayson Gaskins Referring Phys: [phone] LINDSAY NICOLE FINCH IMPRESSIONS  1. Left ventricular ejection fraction, by estimation, is <20%. The left ventricle has severely decreased function. The left ventricle demonstrates global hypokinesis. The left ventricular internal cavity size was mildly dilated. Left ventricular diastolic parameters are consistent with Grade III diastolic dysfunction (restrictive).  2. Right ventricular  systolic function is severely reduced. The right ventricular size is normal.  3. Left atrial size was moderately dilated.  4. Right atrial size was moderately dilated.  5. The mitral valve is normal in structure. Mild to moderate mitral valve regurgitation. No evidence of mitral stenosis.  6. The aortic valve is tricuspid. There is mild calcification of the aortic valve. Aortic valve regurgitation is not visualized. Aortic valve sclerosis/calcification is present, without any evidence of aortic stenosis.  7. The inferior vena cava is dilated in size with <50% respiratory variability, suggesting right atrial pressure of 15 mmHg. FINDINGS  Left Ventricle: Left ventricular ejection fraction, by estimation, is <20%. The left ventricle has severely decreased function. The left ventricle demonstrates global hypokinesis. The left ventricular internal cavity size was mildly dilated. There is no  left ventricular hypertrophy. Left ventricular diastolic parameters are consistent with Grade III diastolic dysfunction (restrictive). Right Ventricle: The right ventricular size is normal. No increase in right ventricular wall thickness. Right ventricular systolic function is severely reduced. Left Atrium: Left atrial size was moderately dilated. Right Atrium: Right atrial size was moderately dilated. Pericardium: There is no evidence of pericardial effusion. Mitral Valve: The mitral valve is normal in structure. Mild mitral annular calcification. Mild to moderate mitral valve regurgitation. No evidence of mitral valve stenosis. Tricuspid Valve: The tricuspid valve is normal in structure. Tricuspid valve regurgitation is mild . No evidence of tricuspid stenosis. Aortic Valve: The aortic valve is tricuspid. There is mild calcification of the aortic valve. Aortic valve regurgitation is not visualized. Aortic valve sclerosis/calcification is present, without any evidence of aortic stenosis. Aortic valve mean gradient measures 5.0  mmHg. Aortic valve peak gradient measures 8.9 mmHg. Aortic valve area, by VTI measures 2.23 cm. Pulmonic Valve: The pulmonic valve was not well visualized. Pulmonic valve regurgitation is not visualized. No evidence of pulmonic stenosis. Aorta: The aortic root is normal in size and structure. Venous: The inferior vena cava is dilated in size with less than 50% respiratory variability, suggesting right atrial pressure of 15 mmHg. IAS/Shunts: No atrial level shunt detected by color flow Doppler. Additional Comments: A device lead is visualized.  LEFT VENTRICLE PLAX 2D LVIDd:         5.10 cm   Diastology LVIDs:  4.80 cm   LV e' medial:    4.46 cm/s LV PW:         0.80 cm   LV E/e' medial:  23.8 LV IVS:        0.80 cm   LV e' lateral:   9.90 cm/s LVOT diam:     1.90 cm   LV E/e' lateral: 10.7 LV SV:         53 LV SV Index:   28 LVOT Area:     2.84 cm  RIGHT VENTRICLE             IVC RV S prime:     12.60 cm/s  IVC diam: 2.10 cm TAPSE (M-mode): 1.8 cm LEFT ATRIUM             Index        RIGHT ATRIUM           Index LA Vol (A2C):   93.3 ml 48.95 ml/m  RA Area:     18.60 cm LA Vol (A4C):   70.9 ml 37.20 ml/m  RA Volume:   54.50 ml  28.59 ml/m LA Biplane Vol: 83.3 ml 43.70 ml/m  AORTIC VALVE AV Area (Vmax):    1.85 cm AV Area (Vmean):   1.97 cm AV Area (VTI):     2.23 cm AV Vmax:           149.00 cm/s AV Vmean:          113.000 cm/s AV VTI:            0.238 m AV Peak Grad:      8.9 mmHg AV Mean Grad:      5.0 mmHg LVOT Vmax:         97.40 cm/s LVOT Vmean:        78.700 cm/s LVOT VTI:          0.187 m LVOT/AV VTI ratio: 0.79  AORTA Ao Root diam: 2.90 cm MITRAL VALVE MV Area (PHT): 4.33 cm     SHUNTS MV Decel Time: 175 msec     Systemic VTI:  0.19 m MV E velocity: 106.00 cm/s  Systemic Diam: 1.90 cm Toribio Fuel MD Electronically signed by Toribio Fuel MD Signature Date/Time: 03/27/2024/2:33:43 PM    Final      Medications:     Scheduled Medications:  apixaban   5 mg Oral BID   atorvastatin    80 mg Oral Daily   insulin  aspart  0-9 Units Subcutaneous TID WC   pantoprazole   80 mg Oral Daily   sodium chloride  flush  3 mL Intravenous Q12H   tamsulosin   0.4 mg Oral QPC supper    Infusions:  amiodarone  30 mg/hr (03/28/24 0403)   furosemide  (LASIX ) 200 mg in dextrose  5 % 100 mL (2 mg/mL) infusion 20 mg/hr (03/27/24 2312)   milrinone  0.25 mcg/kg/min (03/27/24 2145)    PRN Medications: acetaminophen , albuterol , dextromethorphan -guaiFENesin , melatonin, methocarbamol , ondansetron  (ZOFRAN ) IV, sodium chloride  flush    Assessment/Plan   1. Acute on chronic biventricular heart failure: Ischemic cardiomyopathy.  Echo (5/23) with EF 30-35%, severe LV dilation, mild MR, low normal RV systolic function. He has a Medtronic ICD and a cardiac contractility modulator. Echo in 7/24 showed EF 20-25% with mild RV dysfunction.  Echo in 11/25 (done at [facility] so cannot review) showed EF 25%, mild RV dysfunction, moderate MR.  He had a RHC in 10/25 with markedly low cardiac output and markedly high right and left heart filling pressures.  GDMT titration has been complicated by CKD stage 3b and soft BP.  He has RBBB-like IVCD, have discussed with EP and unlikely to benefit from CRT upgrade.  He was just discharged from a hospitalization for CHF exacerbation. - Repeat echo: EF < 20%, RV severely reduced, moderate BAE, mild to moderate MR - Continue milrinone  0.25 mcg/kg/min - Remains volume overloaded. Continue lasix  gtt at 20 mg/hr. Give 5 mg metolazone  today.  - Place PICC to monitor CVP and Co-ox, will need to be with IR (has b/l devices) - planned for some time today - Off GDMT with low blood pressure and CKD. On midodrine  at home, will hold off on restarting for now. Restart GDMT as able. Hopefully renal function improves with inotrope support. - Off beta blocker with low output.  - He had a severe groin yeast infection shortly after starting Jardiance so will not rechallenge with SGLT2  inhibitor.  - With markedly low cardiac output on RHC in 10/25, we need to at least consider advance therapies (LVAD).  The main barriers are his kidney function and possibly RV dysfunction with low PAPi on recent RHC.  He will need repeat RHC after diuresis.  We will need to see how he responds to milrinone .  If creatinine improves with diuresis + milrinone  and PAPi appears reasonable, he may be a candidate for LVAD.    2. CAD: s/p CABG x 3 in 10/22. - No s/s angina - Continue atorvastatin  80 mg daily.  - No ASA given stable CAD and apixaban  use.    3. CKD: stage IV.  Follows with nephrology.  Last creatinine 2.3   - Scr 2.7 -> 2.5 -> 2.3 with milrinone  and IV diuresis - follow daily   4. Atrial fibrillation: Paroxysmal.  Device interrogation 11/24 shows occasional AF runs, generally short.  He is now off amiodarone .  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 Will need to stop eliquis  and start bival prior to any invasive workup/procedures.   6. Hyponatremia: Limit fluid intake - In setting of massive volume overload  7. Hypokalemia - supp     Length of Stay: 2  FINCH, LINDSAY N, PA-C  03/28/2024, 7:02 AM  Advanced Heart Failure Team Pager [phone] (M-F; 7a - 5p)  Please contact [facility] for night-coverage after hours (5p -7a ) and weekends on [email or web address]  Patient seen and examined with the above-signed Advanced Practice Provider and/or Housestaff. I personally reviewed laboratory data, imaging studies and relevant notes. I independently examined the patient and formulated the important aspects of the plan. I have edited the note to reflect any of my changes or salient points. I have personally discussed the plan with the patient and/or family.   Diuresing fairly well but still feels bloated and SOB. Weight unchanged. Renal function continues to improve   Echo LVEF < 20% RV moderate-severely  HK   General:  Sitting up in bed.  HEENT: normal Neck: supple. JVP to jaw Cor: Regular rate & rhythm. No rubs, gallops or murmurs. Lungs: decreased at bases Abdomen: obese soft, nontender, + distended.Good bowel sounds. Extremities: no cyanosis, clubbing, rash, 2+ edema Neuro: alert & orientedx3, cranial nerves grossly intact. moves all 4 extremities w/o difficulty. Affect pleasant   Making very slow progress but still tenuous.Continue milrinone   Increase lasix  gtt to 30. Give metolazone . Place PICC   Based on echo and renal function may not be  VAD candidate but we will see how much things can turn around.    Toribio Fuel, MD  8:28 AM

## 2024-03-28 NOTE — Progress Notes (Signed)
 Arrived back from interventional radiology after PICC placement.  Transport Method: bed  Escorted by: Patient Transporter

## 2024-03-28 NOTE — Progress Notes (Signed)
 Advanced Heart Failure Rounding Note  HF Cardiologist: Dr. Rolan  Chief Complaint: HF Subjective:    Remains on 0.25 milrinone . Awaiting PICC line placement with IR.  2.7L UOP last 24 hrs with lasix  gtt at 20 mg/hr. Weight unchanged.  Continues with orthopnea, had a bad coughing fit early am. Little appetite, abdomen feels full.   Objective:   Weight Range: 83.6 kg Body mass index is 30.67 kg/m.   Vital Signs:   Temp:  [97.7 F (36.5 C)-98.6 F (37 C)] 98.3 F (36.8 C) (11/28 0724) Pulse Rate:  [72-85] 85 (11/28 0724) Resp:  [16-21] 20 (11/28 0724) BP: (93-113)/(63-71) 113/71 (11/28 0724) SpO2:  [93 %-98 %] 94 % (11/28 0724) Weight:  [83.6 kg] 83.6 kg (11/28 0410) Last BM Date : 03/27/24 (per pt)  Weight change: Filed Weights   03/26/24 1459 03/27/24 0358 03/28/24 0410  Weight: 83.6 kg 83.1 kg 83.6 kg    Intake/Output:   Intake/Output Summary (Last 24 hours) at 03/28/2024 0825 Last data filed at 03/28/2024 0738 Gross per 24 hour  Intake 1597.53 ml  Output 2130 ml  Net -532.47 ml      Physical Exam    General:  Sitting up on side of bed. Cor: Jvp to ear. Regular rate & rhythm. No murmurs. Lungs: clear Abdomen: + distended Extremities: 1+ edema, + UNNA Neuro: alert & orientedx3. Affect pleasant     Telemetry  SR 80s, PVCs and PACs   Labs    CBC Recent Labs    03/26/24 1455  WBC 6.5  NEUTROABS 4.9  HGB 11.2*  HCT 34.9*  MCV 76.4*  PLT 172   Basic Metabolic Panel Recent Labs    88/72/74 0227 03/28/24 0210  NA 126* 124*  K 3.8 4.2  CL 86* 86*  CO2 28 25  GLUCOSE 168* 159*  BUN 45* 42*  CREATININE 2.52* 2.34*  CALCIUM  8.8* 8.7*  MG 1.9 2.1   Liver Function Tests Recent Labs    03/26/24 1455  AST 19  ALT 11  ALKPHOS 72  BILITOT 1.2  PROT 6.7  ALBUMIN  3.7   No results for input(s): LIPASE, AMYLASE in the last 72 hours. Cardiac Enzymes No results for input(s): CKTOTAL, CKMB, CKMBINDEX, TROPONINI in the  last 72 hours.  BNP: BNP (last 3 results) Recent Labs    02/01/24 1118 02/09/24 0921 03/26/24 1455  BNP 941.8* 1,871.3* 2,129.6*    ProBNP (last 3 results) Recent Labs    03/19/24 1038 03/25/24 1249  PROBNP 19,467.0* 20,964.0*     D-Dimer No results for input(s): DDIMER in the last 72 hours. Hemoglobin A1C No results for input(s): HGBA1C in the last 72 hours. Fasting Lipid Panel No results for input(s): CHOL, HDL, LDLCALC, TRIG, CHOLHDL, LDLDIRECT in the last 72 hours. Thyroid Function Tests Recent Labs    03/27/24 0227  TSH 2.527    Other results:   Imaging    ECHOCARDIOGRAM COMPLETE Result Date: 03/27/2024    ECHOCARDIOGRAM REPORT   Patient Name:   Jermaine Kramer Date of Exam: 03/27/2024 Medical Rec #:  [id number]     Height:       65.0 in Accession #:    [id number]    Weight:       183.3 lb Date of Birth:  12-30-1950      BSA:          1.906 m Patient Age:    73 years      BP:  106/70 mmHg Patient Gender: M             HR:           90 bpm. Exam Location:  Inpatient Procedure: 2D Echo, Cardiac Doppler, Color Doppler and Intracardiac            Opacification Agent (Both Spectral and Color Flow Doppler were            utilized during procedure). Indications:    CHF-Acute Systolic I50.21  History:        Patient has prior history of Echocardiogram examinations, most                 recent 06/29/2022. CHF and Cardiomyopathy, CAD, Prior CABG,                 Stroke; Risk Factors:Hypertension, Diabetes and Sleep Apnea.  Sonographer:    Jayson Gaskins Referring Phys: [phone] LINDSAY NICOLE FINCH IMPRESSIONS  1. Left ventricular ejection fraction, by estimation, is <20%. The left ventricle has severely decreased function. The left ventricle demonstrates global hypokinesis. The left ventricular internal cavity size was mildly dilated. Left ventricular diastolic parameters are consistent with Grade III diastolic dysfunction (restrictive).  2. Right ventricular  systolic function is severely reduced. The right ventricular size is normal.  3. Left atrial size was moderately dilated.  4. Right atrial size was moderately dilated.  5. The mitral valve is normal in structure. Mild to moderate mitral valve regurgitation. No evidence of mitral stenosis.  6. The aortic valve is tricuspid. There is mild calcification of the aortic valve. Aortic valve regurgitation is not visualized. Aortic valve sclerosis/calcification is present, without any evidence of aortic stenosis.  7. The inferior vena cava is dilated in size with <50% respiratory variability, suggesting right atrial pressure of 15 mmHg. FINDINGS  Left Ventricle: Left ventricular ejection fraction, by estimation, is <20%. The left ventricle has severely decreased function. The left ventricle demonstrates global hypokinesis. The left ventricular internal cavity size was mildly dilated. There is no  left ventricular hypertrophy. Left ventricular diastolic parameters are consistent with Grade III diastolic dysfunction (restrictive). Right Ventricle: The right ventricular size is normal. No increase in right ventricular wall thickness. Right ventricular systolic function is severely reduced. Left Atrium: Left atrial size was moderately dilated. Right Atrium: Right atrial size was moderately dilated. Pericardium: There is no evidence of pericardial effusion. Mitral Valve: The mitral valve is normal in structure. Mild mitral annular calcification. Mild to moderate mitral valve regurgitation. No evidence of mitral valve stenosis. Tricuspid Valve: The tricuspid valve is normal in structure. Tricuspid valve regurgitation is mild . No evidence of tricuspid stenosis. Aortic Valve: The aortic valve is tricuspid. There is mild calcification of the aortic valve. Aortic valve regurgitation is not visualized. Aortic valve sclerosis/calcification is present, without any evidence of aortic stenosis. Aortic valve mean gradient measures 5.0  mmHg. Aortic valve peak gradient measures 8.9 mmHg. Aortic valve area, by VTI measures 2.23 cm. Pulmonic Valve: The pulmonic valve was not well visualized. Pulmonic valve regurgitation is not visualized. No evidence of pulmonic stenosis. Aorta: The aortic root is normal in size and structure. Venous: The inferior vena cava is dilated in size with less than 50% respiratory variability, suggesting right atrial pressure of 15 mmHg. IAS/Shunts: No atrial level shunt detected by color flow Doppler. Additional Comments: A device lead is visualized.  LEFT VENTRICLE PLAX 2D LVIDd:         5.10 cm   Diastology LVIDs:  4.80 cm   LV e' medial:    4.46 cm/s LV PW:         0.80 cm   LV E/e' medial:  23.8 LV IVS:        0.80 cm   LV e' lateral:   9.90 cm/s LVOT diam:     1.90 cm   LV E/e' lateral: 10.7 LV SV:         53 LV SV Index:   28 LVOT Area:     2.84 cm  RIGHT VENTRICLE             IVC RV S prime:     12.60 cm/s  IVC diam: 2.10 cm TAPSE (M-mode): 1.8 cm LEFT ATRIUM             Index        RIGHT ATRIUM           Index LA Vol (A2C):   93.3 ml 48.95 ml/m  RA Area:     18.60 cm LA Vol (A4C):   70.9 ml 37.20 ml/m  RA Volume:   54.50 ml  28.59 ml/m LA Biplane Vol: 83.3 ml 43.70 ml/m  AORTIC VALVE AV Area (Vmax):    1.85 cm AV Area (Vmean):   1.97 cm AV Area (VTI):     2.23 cm AV Vmax:           149.00 cm/s AV Vmean:          113.000 cm/s AV VTI:            0.238 m AV Peak Grad:      8.9 mmHg AV Mean Grad:      5.0 mmHg LVOT Vmax:         97.40 cm/s LVOT Vmean:        78.700 cm/s LVOT VTI:          0.187 m LVOT/AV VTI ratio: 0.79  AORTA Ao Root diam: 2.90 cm MITRAL VALVE MV Area (PHT): 4.33 cm     SHUNTS MV Decel Time: 175 msec     Systemic VTI:  0.19 m MV E velocity: 106.00 cm/s  Systemic Diam: 1.90 cm Toribio Fuel MD Electronically signed by Toribio Fuel MD Signature Date/Time: 03/27/2024/2:33:43 PM    Final      Medications:     Scheduled Medications:  apixaban   5 mg Oral BID   atorvastatin    80 mg Oral Daily   insulin  aspart  0-9 Units Subcutaneous TID WC   lidocaine   20 mL Infiltration Once   metolazone   5 mg Oral Once   pantoprazole   80 mg Oral Daily   potassium chloride   40 mEq Oral Once   sodium chloride  flush  3 mL Intravenous Q12H   tamsulosin   0.4 mg Oral QPC supper    Infusions:  amiodarone  30 mg/hr (03/28/24 0738)   furosemide  (LASIX ) 200 mg in dextrose  5 % 100 mL (2 mg/mL) infusion 20 mg/hr (03/28/24 0738)   milrinone  0.25 mcg/kg/min (03/28/24 0738)    PRN Medications: acetaminophen , albuterol , dextromethorphan -guaiFENesin , melatonin, methocarbamol , ondansetron  (ZOFRAN ) IV, sodium chloride  flush    Assessment/Plan   1. Acute on chronic biventricular heart failure: Ischemic cardiomyopathy.  Echo (5/23) with EF 30-35%, severe LV dilation, mild MR, low normal RV systolic function. He has a Medtronic ICD and a cardiac contractility modulator. Echo in 7/24 showed EF 20-25% with mild RV dysfunction.  Echo in 11/25 (done at [facility] so cannot review) showed EF 25%, mild  RV dysfunction, moderate MR.  He had a RHC in 10/25 with markedly low cardiac output and markedly high right and left heart filling pressures.  GDMT titration has been complicated by CKD stage 3b and soft BP.  He has RBBB-like IVCD, have discussed with EP and unlikely to benefit from CRT upgrade.  He was just discharged from a hospitalization for CHF exacerbation. - Repeat echo: EF < 20%, RV severely reduced, moderate BAE, mild to moderate MR - Continue milrinone  0.25 mcg/kg/min - Remains volume overloaded. Continue lasix  gtt at 20 mg/hr. Give 5 mg metolazone  today.  - Place PICC to monitor CVP and Co-ox, will need to be with IR (has b/l devices) - planned for some time today - Off GDMT with low blood pressure and CKD. On midodrine  at home, will hold off on restarting for now. Restart GDMT as able. Hopefully renal function improves with inotrope support. - Off beta blocker with low output.  - He  had a severe groin yeast infection shortly after starting Jardiance so will not rechallenge with SGLT2 inhibitor.  - With markedly low cardiac output on RHC in 10/25, we need to at least consider advance therapies (LVAD).  The main barriers are his kidney function and possibly RV dysfunction with low PAPi on recent RHC.  He will need repeat RHC after diuresis.  We will need to see how he responds to milrinone .  If creatinine improves with diuresis + milrinone  and PAPi appears reasonable, he may be a candidate for LVAD.    2. CAD: s/p CABG x 3 in 10/22. - No s/s angina - Continue atorvastatin  80 mg daily.  - No ASA given stable CAD and apixaban  use.    3. CKD: stage IV.  Follows with nephrology.  Last creatinine 2.3   - Scr 2.7 -> 2.5 -> 2.3 with milrinone  and IV diuresis - follow daily   4. Atrial fibrillation: Paroxysmal.  Device interrogation 11/24 shows occasional AF runs, generally short.  He is now off amiodarone .  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 Will need to stop eliquis  and start bival prior to any invasive workup/procedures.   6. Hyponatremia: Limit fluid intake - In setting of massive volume overload  7. Hypokalemia - supp     Length of Stay: 2  Toribio Fuel, MD  03/28/2024, 8:25 AM  Advanced Heart Failure Team Pager [phone] (M-F; 7a - 5p)  Please contact [facility] for night-coverage after hours (5p -7a ) and weekends on [email or web address]  Patient seen and examined with the above-signed Advanced Practice Provider and/or Housestaff. I personally reviewed laboratory data, imaging studies and relevant notes. I independently examined the patient and formulated the important aspects of the plan. I have edited the note to reflect any of my changes or salient points. I have personally discussed the plan with the patient and/or family.  Diuresing fairly well but still feels bloated and  SOB. Weight unchanged. Renal function continues to improve  Echo LVEF < 20% RV moderate-severely HK  General:  Sitting up in bed.  HEENT: normal Neck: supple. JVP to jaw Cor: Regular rate & rhythm. No rubs, gallops or murmurs. Lungs: decreased at bases Abdomen: obese soft, nontender, + distended.Good bowel sounds. Extremities: no cyanosis, clubbing, rash, 2+ edema Neuro: alert & orientedx3, cranial nerves grossly intact. moves all 4 extremities w/o difficulty. Affect pleasant  Making very slow progress but still tenuous.Continue milrinone   Increase lasix  gtt to 30. Give metolazone . Place PICC  Based on echo and renal function may not be VAD candidate but we will see how much things can turn around.   Toribio Fuel, MD  8:28 AM

## 2024-03-28 NOTE — Progress Notes (Signed)
 Transported off unit to Interventional Radiology at this time for PICC placement.  Transport method: bed Escorted by: Patient Transporter

## 2024-03-28 NOTE — Progress Notes (Signed)
 Transferred patient to room 3E13 from 3E03 at this time.

## 2024-03-28 NOTE — Procedures (Signed)
 RN called down about PICC line bleeding from insertion site. This IR tech arrived to patient room and removed dressing. Hemostasis was achieved and stat lock and dressing were changed with no issues. Patient arm was cleaned. RN notified and told to call if any more issues with PICC line.

## 2024-03-28 NOTE — Procedures (Signed)
 PROCEDURE SUMMARY:  Successful placement of double lumen PICC line to right brachial vein. Length 32 cm Tip at lower SVC/RA PICC capped No complications Ready for use  EBL < 5 mL   Longino Trefz H Eustacio Ellen PA-C 03/28/2024, 8:45 AM

## 2024-03-28 NOTE — Progress Notes (Signed)
 0956:Call placed to Interventional Radiology Tech, spoke with Ozell RT.  Called to relay concern of bleeding noted at PICC insertion site as it was just placed by IR provider this AM.  1005: Michael RT from IR at bedside to change dressing.  1035: Dressing change completed by Ozell RT from IR.  Dressing clean dry and intact.

## 2024-03-28 NOTE — Progress Notes (Signed)
 PIV in right arm removed at this time due to PICC placement in same arm.

## 2024-03-28 NOTE — Plan of Care (Signed)

## 2024-03-29 DIAGNOSIS — I5023 Acute on chronic systolic (congestive) heart failure: Secondary | ICD-10-CM | POA: Diagnosis not present

## 2024-03-29 LAB — GLUCOSE, CAPILLARY
Glucose-Capillary: 158 mg/dL — ABNORMAL HIGH (ref 70–99)
Glucose-Capillary: 241 mg/dL — ABNORMAL HIGH (ref 70–99)
Glucose-Capillary: 241 mg/dL — ABNORMAL HIGH (ref 70–99)
Glucose-Capillary: 200 mg/dL — ABNORMAL HIGH (ref 70–99)

## 2024-03-29 LAB — BASIC METABOLIC PANEL WITH GFR
Anion gap: 13 (ref 5–15)
BUN: 40 mg/dL — ABNORMAL HIGH (ref 8–23)
CO2: 28 mmol/L (ref 22–32)
Calcium: 8.8 mg/dL — ABNORMAL LOW (ref 8.9–10.3)
Chloride: 83 mmol/L — ABNORMAL LOW (ref 98–111)
Creatinine, Ser: 2.34 mg/dL — ABNORMAL HIGH (ref 0.61–1.24)
GFR, Estimated: 28 mL/min — ABNORMAL LOW (ref >60)
Glucose, Bld: 191 mg/dL — ABNORMAL HIGH (ref 70–99)
Potassium: 3.6 mmol/L (ref 3.5–5.1)
Sodium: 124 mmol/L — ABNORMAL LOW (ref 135–145)

## 2024-03-29 LAB — MAGNESIUM: Magnesium: 2 mg/dL (ref 1.7–2.4)

## 2024-03-29 LAB — COOXEMETRY PANEL
Carboxyhemoglobin: 1.2 % (ref 0.5–1.5)
Methemoglobin: <0.7 % (ref 0.0–1.5)
O2 Saturation: 51.8 %
Total hemoglobin: 10.6 g/dL — ABNORMAL LOW (ref 12.0–16.0)

## 2024-03-29 MED ORDER — DOBUTAMINE-DEXTROSE 4-5 MG/ML-% IV SOLN
7.5000 ug/kg/min | INTRAVENOUS | Status: DC
  Administered 2024-03-29: 2.5 ug/kg/min via INTRAVENOUS
  Administered 2024-03-31: 7.5 ug/kg/min via INTRAVENOUS
  Administered 2024-04-02 – 2024-04-03 (×2): 5 ug/kg/min via INTRAVENOUS
  Filled 2024-03-29 (×4): qty 250

## 2024-03-29 NOTE — Plan of Care (Signed)
   Problem: Health Behavior/Discharge Planning: Goal: Ability to manage health-related needs will improve Outcome: Progressing   Problem: Clinical Measurements: Goal: Ability to maintain clinical measurements within normal limits will improve Outcome: Progressing Goal: Diagnostic test results will improve Outcome: Progressing

## 2024-03-29 NOTE — Progress Notes (Signed)
 Advanced Heart Failure Rounding Note  HF Cardiologist: Dr. Rolan  Chief Complaint: HF Subjective:    Remains on 0.25 milrinone  and lasix  30/hr. Out 2.4L Weight down 4 pounds   Co-ox 52% CVP 13 (checked personally)  Scr unchanged at 2.3  Had amio infiltrate this am    Breathing better but still with cough    Objective:   Weight Range: 81.7 kg Body mass index is 29.97 kg/m.   Vital Signs:   Temp:  [96.6 F (35.9 C)-98.7 F (37.1 C)] 96.6 F (35.9 C) (11/29 0738) Pulse Rate:  [73-86] 82 (11/29 0738) Resp:  [15-18] 15 (11/29 0738) BP: (83-123)/(56-75) 123/66 (11/29 0738) SpO2:  [86 %-100 %] 95 % (11/29 0738) Weight:  [81.7 kg] 81.7 kg (11/29 0343) Last BM Date : 03/29/24  Weight change: Filed Weights   03/27/24 0358 03/28/24 0410 03/29/24 0343  Weight: 83.1 kg 83.6 kg 81.7 kg    Intake/Output:   Intake/Output Summary (Last 24 hours) at 03/29/2024 1037 Last data filed at 03/29/2024 0914 Gross per 24 hour  Intake 1087.34 ml  Output 2700 ml  Net -1612.66 ml      Physical Exam    General:  Sitting up in bed. No resp difficulty HEENT: normal Neck: supple. JVP to jaw Cor: Regular rate & rhythm. No rubs, gallops or murmurs. Lungs: clear Abdomen: soft, nontender, +distended.Good bowel sounds. Extremities: no cyanosis, clubbing, rash, 1-2+ edema + UNNA Neuro: alert & orientedx3, cranial nerves grossly intact. moves all 4 extremities w/o difficulty. Affect pleasant    Telemetry   Sinus 80s with PVcs Personally reviewed   Labs    CBC Recent Labs    03/26/24 1455  WBC 6.5  NEUTROABS 4.9  HGB 11.2*  HCT 34.9*  MCV 76.4*  PLT 172   Basic Metabolic Panel Recent Labs    88/71/74 0210 03/29/24 0500  NA 124* 124*  K 4.2 3.6  CL 86* 83*  CO2 25 28  GLUCOSE 159* 191*  BUN 42* 40*  CREATININE 2.34* 2.34*  CALCIUM  8.7* 8.8*  MG 2.1 2.0   Liver Function Tests Recent Labs    03/26/24 1455  AST 19  ALT 11  ALKPHOS 72  BILITOT 1.2   PROT 6.7  ALBUMIN  3.7   No results for input(s): LIPASE, AMYLASE in the last 72 hours. Cardiac Enzymes No results for input(s): CKTOTAL, CKMB, CKMBINDEX, TROPONINI in the last 72 hours.  BNP: BNP (last 3 results) Recent Labs    02/01/24 1118 02/09/24 0921 03/26/24 1455  BNP 941.8* 1,871.3* 2,129.6*    ProBNP (last 3 results) Recent Labs    03/19/24 1038 03/25/24 1249  PROBNP 19,467.0* 20,964.0*     D-Dimer No results for input(s): DDIMER in the last 72 hours. Hemoglobin A1C No results for input(s): HGBA1C in the last 72 hours. Fasting Lipid Panel No results for input(s): CHOL, HDL, LDLCALC, TRIG, CHOLHDL, LDLDIRECT in the last 72 hours. Thyroid Function Tests Recent Labs    03/27/24 0227  TSH 2.527    Other results:   Imaging    IR PATIENT EVAL TECH 0-60 MINS Result Date: 03/28/2024 Herold Ozell HERO, RT     03/28/2024 10:41 AM RN called down about PICC line bleeding from insertion site. This IR tech arrived to patient room and removed dressing. Hemostasis was achieved and stat lock and dressing were changed with no issues. Patient arm was cleaned. RN notified and told to call if any more issues with PICC line.  Medications:     Scheduled Medications:  apixaban   5 mg Oral BID   atorvastatin   80 mg Oral Daily   Chlorhexidine  Gluconate Cloth  6 each Topical Daily   insulin  aspart  0-9 Units Subcutaneous TID WC   pantoprazole   80 mg Oral Daily   sodium chloride  flush  10-40 mL Intracatheter Q12H   sodium chloride  flush  3 mL Intravenous Q12H   tamsulosin   0.4 mg Oral QPC supper    Infusions:  amiodarone  30 mg/hr (03/29/24 0335)   furosemide  (LASIX ) 200 mg in dextrose  5 % 100 mL (2 mg/mL) infusion 30 mg/hr (03/29/24 0753)   milrinone  0.25 mcg/kg/min (03/29/24 0335)    PRN Medications: acetaminophen , albuterol , dextromethorphan -guaiFENesin , melatonin, methocarbamol , ondansetron  (ZOFRAN ) IV, sodium chloride  flush,  sodium chloride  flush    Assessment/Plan   1. Acute on chronic biventricular heart failure: Ischemic cardiomyopathy.  Echo (5/23) with EF 30-35%, severe LV dilation, mild MR, low normal RV systolic function. He has a Medtronic ICD and a cardiac contractility modulator. Echo in 7/24 showed EF 20-25% with mild RV dysfunction.  Echo in 11/25 (done at [facility] so cannot review) showed EF 25%, mild RV dysfunction, moderate MR.  He had a RHC in 10/25 with markedly low cardiac output and markedly high right and left heart filling pressures.  GDMT titration has been complicated by CKD stage 3b and soft BP.  He has RBBB-like IVCD, have discussed with EP and unlikely to benefit from CRT upgrade.  He was just discharged from a hospitalization for CHF exacerbation. - Repeat echo: EF < 20%, RV severely reduced, moderate BAE, mild to moderate MR - On milrinone  0.25 mcg/kg/min. Co-ox marginal 52% SBP 80-90s. Wil switch to DBA 2.5. Titrate as needed - On lasix  gtt at 30. Weight down 4 pounds - Off GDMT with low blood pressure and CKD. On midodrine  at home - Off beta blocker with low output.  - He had a severe groin yeast infection shortly after starting Jardiance so will not rechallenge with SGLT2 inhibitor.  - With markedly low cardiac output on RHC in 10/25, we need to at least consider advance therapies (LVAD).  The main barriers are his kidney function and possibly RV dysfunction with low PAPi on recent RHC.  He will need repeat RHC after diuresis.  We will need to see how he responds to milrinone .  If creatinine improves with diuresis + milrinone  and PAPi appears reasonable, he may be a candidate for LVAD but I suspect this is unlikely given current course    2. CAD: s/p CABG x 3 in 10/22. - No s/s angina - Continue atorvastatin  80 mg daily.  - No ASA given stable CAD and apixaban  use.    3. CKD: stage IV.  Follows with nephrology.  Baseline creatinine 2.3   - Scr 2.7 -> 2.5 -> 2.3 -> 2.3 with  milrinone  and IV diuresis - follow daily   4. Atrial fibrillation: Paroxysmal.  Device interrogation 11/24 shows occasional AF runs, generally short.  He is now off amiodarone .  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 Will need to stop eliquis  and start bival prior to any invasive workup/procedures.   6. Hyponatremia: Limit fluid intake - In setting of massive volume overload - Can consider tolvaptan as needed  7. Hypokalemia - k 3.6 supp   8. Amiodarone  infiltrate - local care. D/w  RN personally. Amio switched to PICC    Length of Stay: 3  Toribio Fuel, MD  03/29/2024, 10:37 AM  Advanced Heart Failure Team Pager [phone] (M-F; 7a - 5p)  Please contact [facility] for night-coverage after hours (5p -7a ) and weekends on [email or web address]

## 2024-03-29 NOTE — Progress Notes (Signed)
   Notified by RN that patient had an Amiodarone  drip infiltrate on PIV. Area slightly red and tender. RN removed IV, marked the area, and switch the IV Amiodarone  to PICC line. Agree with this. MD will round on patient later today.  Zoraida Havrilla E Starr Urias, PA-C 03/29/2024 9:39 AM

## 2024-03-29 NOTE — Progress Notes (Signed)
 Patient notified RN that he chip his right front tooth while eating an ice chip. RN assessed patients oral cavity. Minimal bleeding, fragment of tooth still attached at gums. Cards made aware.

## 2024-03-29 NOTE — Progress Notes (Signed)
 RN performed shift assessment and patient complains of left forearm tenderness where amio gtt infusing through a PIV was located. RN changed tubing and switch amio gtt infusion to the PICC. PIV removed, site is red, tender to touch and site is marked. RN applied heat and made patient aware. Pharmacy notifed. Cardiology notified.

## 2024-03-30 DIAGNOSIS — I5023 Acute on chronic systolic (congestive) heart failure: Secondary | ICD-10-CM | POA: Diagnosis not present

## 2024-03-30 LAB — BASIC METABOLIC PANEL WITH GFR
Anion gap: 14 (ref 5–15)
Anion gap: 12 (ref 5–15)
BUN: 43 mg/dL — ABNORMAL HIGH (ref 8–23)
BUN: 44 mg/dL — ABNORMAL HIGH (ref 8–23)
CO2: 27 mmol/L (ref 22–32)
CO2: 29 mmol/L (ref 22–32)
Calcium: 8.6 mg/dL — ABNORMAL LOW (ref 8.9–10.3)
Calcium: 8.5 mg/dL — ABNORMAL LOW (ref 8.9–10.3)
Chloride: 80 mmol/L — ABNORMAL LOW (ref 98–111)
Chloride: 81 mmol/L — ABNORMAL LOW (ref 98–111)
Creatinine, Ser: 2.23 mg/dL — ABNORMAL HIGH (ref 0.61–1.24)
Creatinine, Ser: 2.3 mg/dL — ABNORMAL HIGH (ref 0.61–1.24)
GFR, Estimated: 30 mL/min — ABNORMAL LOW (ref >60)
GFR, Estimated: 29 mL/min — ABNORMAL LOW (ref >60)
Glucose, Bld: 166 mg/dL — ABNORMAL HIGH (ref 70–99)
Glucose, Bld: 157 mg/dL — ABNORMAL HIGH (ref 70–99)
Potassium: 3.2 mmol/L — ABNORMAL LOW (ref 3.5–5.1)
Potassium: 3.6 mmol/L (ref 3.5–5.1)
Sodium: 121 mmol/L — ABNORMAL LOW (ref 135–145)
Sodium: 122 mmol/L — ABNORMAL LOW (ref 135–145)

## 2024-03-30 LAB — COOXEMETRY PANEL
Carboxyhemoglobin: 2.1 % — ABNORMAL HIGH (ref 0.5–1.5)
Methemoglobin: <0.7 % (ref 0.0–1.5)
O2 Saturation: 51.6 %
Total hemoglobin: 10.8 g/dL — ABNORMAL LOW (ref 12.0–16.0)

## 2024-03-30 LAB — GLUCOSE, CAPILLARY
Glucose-Capillary: 174 mg/dL — ABNORMAL HIGH (ref 70–99)
Glucose-Capillary: 206 mg/dL — ABNORMAL HIGH (ref 70–99)
Glucose-Capillary: 223 mg/dL — ABNORMAL HIGH (ref 70–99)
Glucose-Capillary: 144 mg/dL — ABNORMAL HIGH (ref 70–99)

## 2024-03-30 LAB — HEPATIC FUNCTION PANEL
ALT: 11 U/L (ref 0–44)
AST: 20 U/L (ref 15–41)
Albumin: 3.5 g/dL (ref 3.5–5.0)
Alkaline Phosphatase: 65 U/L (ref 38–126)
Bilirubin, Direct: 0.3 mg/dL — ABNORMAL HIGH (ref 0.0–0.2)
Indirect Bilirubin: 0.9 mg/dL (ref 0.3–0.9)
Total Bilirubin: 1.2 mg/dL (ref 0.0–1.2)
Total Protein: 6.5 g/dL (ref 6.5–8.1)

## 2024-03-30 LAB — MAGNESIUM: Magnesium: 2 mg/dL (ref 1.7–2.4)

## 2024-03-30 MED ORDER — TOLVAPTAN 15 MG PO TABS
15.0000 mg | ORAL_TABLET | Freq: Once | ORAL | Status: AC
  Administered 2024-03-30: 15 mg via ORAL
  Filled 2024-03-30: qty 1

## 2024-03-30 MED ORDER — POTASSIUM CHLORIDE CRYS ER 20 MEQ PO TBCR
20.0000 meq | EXTENDED_RELEASE_TABLET | Freq: Once | ORAL | Status: AC
  Administered 2024-03-30: 20 meq via ORAL
  Filled 2024-03-30: qty 1

## 2024-03-30 MED ORDER — POTASSIUM CHLORIDE CRYS ER 20 MEQ PO TBCR: 40.0000 meq | EXTENDED_RELEASE_TABLET | Freq: Once | ORAL | Status: DC

## 2024-03-30 MED ORDER — POTASSIUM CHLORIDE CRYS ER 20 MEQ PO TBCR
60.0000 meq | EXTENDED_RELEASE_TABLET | Freq: Once | ORAL | Status: AC
  Administered 2024-03-30: 60 meq via ORAL
  Filled 2024-03-30: qty 3

## 2024-03-30 NOTE — Progress Notes (Signed)
   Received page from RN about low BP. Nursing teach got a systolic BP in the 60s but recheck by RN was 88/62 (MAP 71) and 82/65 (MAP 72).  RN stopped IV Lasix . CO-OX 51 and Cr 2.23 this morning. Cr slightly better than yesterday.   Went to bedside. Patient is doing okay. He denies any symptoms of hypotension. He is still having some shortness  of breath but it is better than yesterday. He states he was actually able to get some sleep last night. I rechecked BP and it was 86/64. Telemetry shows paced rhythm with rates in the 70s.   Discussed with Dr. Bensimhon. Okay to resume Lasix  drip. Will increase Dobutamine  to 5mcg/kg/min.  Estella Malatesta E Estiven Kohan, PA-C 03/30/2024 9:11 AM

## 2024-03-30 NOTE — Progress Notes (Signed)
 CCMD notified RN that patient HR brady to the 30s, nonsustained. RN rounded on patient, patient lying in bed with family at bedside, asymptomatic. Cardiology notified and to review strip.

## 2024-03-30 NOTE — Progress Notes (Signed)
   Notified by RN earlier this afternoon that CCMD called saying the patient had brief episode of bradycardia down to the 30s at 1433 and that it looked like his pacemaker stopped pacing for a moment. Reviewed telemetry and it looks like break through PVCs with one ventricular couplet. Reviewed strips with Dr. Francyne who agreed. Patient has a CMM implant and looks to be functioning appropriately based off telemetry strips per Dr. Francyne. Potassium was low this morning at 3.2. Magnesium  was okay at 2.0. Will replete potassium given PVCs and Lasix  drip.  Jenniferann Stuckert E Laylia Mui, PA-C 03/30/2024 7:21 PM

## 2024-03-30 NOTE — Progress Notes (Signed)
 Advanced Heart Failure Rounding Note  HF Cardiologist: Dr. Rolan  Chief Complaint: HF Subjective:    Milrinone  switched to DBA 2.5 yesterday due to low BPs.  On lasix  30/hr  Weight down another 6 pounds  SBP remain in 80s  Co-ox 52% CVP 8 (checked personally)  Scr 2.3 -> 2.2  Breathing better    Objective:   Weight Range: 80.9 kg Body mass index is 29.69 kg/m.   Vital Signs:   Temp:  [97.4 F (36.3 C)-98.1 F (36.7 C)] 97.4 F (36.3 C) (11/30 1057) Pulse Rate:  [68-92] 68 (11/30 1057) Resp:  [16-20] 20 (11/30 1057) BP: (82-103)/(61-78) 97/77 (11/30 1057) SpO2:  [90 %-95 %] 90 % (11/30 1057) Weight:  [80.9 kg] 80.9 kg (11/30 0500) Last BM Date : 03/30/24  Weight change: Filed Weights   03/28/24 0410 03/29/24 0343 03/30/24 0500  Weight: 83.6 kg 81.7 kg 80.9 kg    Intake/Output:   Intake/Output Summary (Last 24 hours) at 03/30/2024 1319 Last data filed at 03/30/2024 1114 Gross per 24 hour  Intake 899.54 ml  Output 1900 ml  Net -1000.46 ml      Physical Exam    General:  Sitting up in bed. No resp difficulty HEENT: normal Neck: supple. JVP 8 Cor: Regular rate & rhythm. No rubs, gallops or murmurs. Lungs: clear Abdomen: soft, nontender, + distended.Good bowel sounds. Extremities: no cyanosis, clubbing, rash, tr edema + UNNA Neuro: alert & orientedx3, cranial nerves grossly intact. moves all 4 extremities w/o difficulty. Affect pleasant   Telemetry   Sinus 860-70s + PVCs Personally reviewed   Labs    CBC No results for input(s): WBC, NEUTROABS, HGB, HCT, MCV, PLT in the last 72 hours.  Basic Metabolic Panel Recent Labs    88/70/74 0500 03/30/24 0636  NA 124* 121*  K 3.6 3.2*  CL 83* 80*  CO2 28 27  GLUCOSE 191* 166*  BUN 40* 43*  CREATININE 2.34* 2.23*  CALCIUM  8.8* 8.6*  MG 2.0 2.0   Liver Function Tests No results for input(s): AST, ALT, ALKPHOS, BILITOT, PROT, ALBUMIN  in the last 72 hours.  No  results for input(s): LIPASE, AMYLASE in the last 72 hours. Cardiac Enzymes No results for input(s): CKTOTAL, CKMB, CKMBINDEX, TROPONINI in the last 72 hours.  BNP: BNP (last 3 results) Recent Labs    02/01/24 1118 02/09/24 0921 03/26/24 1455  BNP 941.8* 1,871.3* 2,129.6*    ProBNP (last 3 results) Recent Labs    03/19/24 1038 03/25/24 1249  PROBNP 19,467.0* 20,964.0*     D-Dimer No results for input(s): DDIMER in the last 72 hours. Hemoglobin A1C No results for input(s): HGBA1C in the last 72 hours. Fasting Lipid Panel No results for input(s): CHOL, HDL, LDLCALC, TRIG, CHOLHDL, LDLDIRECT in the last 72 hours. Thyroid Function Tests No results for input(s): TSH, T4TOTAL, T3FREE, THYROIDAB in the last 72 hours.  Invalid input(s): FREET3   Other results:   Imaging    No results found.    Medications:     Scheduled Medications:  apixaban   5 mg Oral BID   atorvastatin   80 mg Oral Daily   Chlorhexidine  Gluconate Cloth  6 each Topical Daily   insulin  aspart  0-9 Units Subcutaneous TID WC   pantoprazole   80 mg Oral Daily   sodium chloride  flush  10-40 mL Intracatheter Q12H   sodium chloride  flush  3 mL Intravenous Q12H   tamsulosin   0.4 mg Oral QPC supper    Infusions:  amiodarone  30 mg/hr (03/30/24 1114)   DOBUTamine 5 mcg/kg/min (03/30/24 0912)   furosemide  (LASIX ) 200 mg in dextrose  5 % 100 mL (2 mg/mL) infusion 30 mg/hr (03/30/24 0511)    PRN Medications: acetaminophen , albuterol , dextromethorphan -guaiFENesin , melatonin, methocarbamol , ondansetron  (ZOFRAN ) IV, sodium chloride  flush, sodium chloride  flush    Assessment/Plan   1. Acute on chronic biventricular heart failure: Ischemic cardiomyopathy.  Echo (5/23) with EF 30-35%, severe LV dilation, mild MR, low normal RV systolic function. He has a Medtronic ICD and a cardiac contractility modulator. Echo in 7/24 showed EF 20-25% with mild RV dysfunction.  Echo in  11/25 (done at [facility] so cannot review) showed EF 25%, mild RV dysfunction, moderate MR.  He had a RHC in 10/25 with markedly low cardiac output and markedly high right and left heart filling pressures.  GDMT titration has been complicated by CKD stage 3b and soft BP.  He has RBBB-like IVCD, have discussed with EP and unlikely to benefit from CRT upgrade.  He was just discharged from a hospitalization for CHF exacerbation. - Repeat echo: EF < 20%, RV severely reduced, moderate BAE, mild to moderate MR - On DBA 2.5 mcg/kg/min. Co-ox marginal 52% SBP 80-90s. Increase DBA to 5 - On lasix  gtt at 30. Weight down 9 pounds. Continue IV diuresis one more day - Off GDMT with low blood pressure and CKD. On midodrine  at home - Off beta blocker with low output.  - He had a severe groin yeast infection shortly after starting Jardiance so will not rechallenge with SGLT2 inhibitor.  - With markedly low cardiac output on RHC in 10/25, we need to at least consider advance therapies (LVAD).  The main barriers are his kidney function and possibly RV dysfunction with low PAPi on recent RHC.  He will need repeat RHC after diuresis.  We will need to see how he responds to milrinone .  If creatinine improves with diuresis + milrinone  and PAPi appears reasonable, he may be a candidate for LVAD but I suspect this is unlikely given current course    2. CAD: s/p CABG x 3 in 10/22. - No s/s angina - Continue atorvastatin  80 mg daily.  - No ASA given stable CAD and apixaban  use.    3. CKD: stage IV.  Follows with nephrology.  Baseline creatinine 2.3   - Scr 2.7 -> 2.5 -> 2.3 -> 2.3 -> 2.2 with inotropes and IV diuresis - follow daily   4. Atrial fibrillation: Paroxysmal.  Device interrogation 11/24 shows occasional AF runs, generally short.  He is now off amiodarone .  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 Will  need to stop eliquis  and start bival prior to any invasive workup/procedures.   6. Hyponatremia: Limit fluid intake - In setting of massive volume overload - Will give tolvaptan 15  7. Hypokalemia - k 3.2 supp      Length of Stay: 4  Toribio Fuel, MD  03/30/2024, 1:19 PM  Advanced Heart Failure Team Pager [phone] (M-F; 7a - 5p)  Please contact [facility] for night-coverage after hours (5p -7a ) and weekends on [email or web address]

## 2024-03-30 NOTE — Plan of Care (Signed)
  Problem: Clinical Measurements: Goal: Will remain free from infection Outcome: Progressing   Problem: Nutrition: Goal: Adequate nutrition will be maintained Outcome: Progressing   Problem: Elimination: Goal: Will not experience complications related to urinary retention Outcome: Progressing

## 2024-03-31 DIAGNOSIS — I5023 Acute on chronic systolic (congestive) heart failure: Secondary | ICD-10-CM | POA: Diagnosis not present

## 2024-03-31 LAB — BASIC METABOLIC PANEL WITH GFR
Anion gap: 12 (ref 5–15)
BUN: 43 mg/dL — ABNORMAL HIGH (ref 8–23)
CO2: 27 mmol/L (ref 22–32)
Calcium: 8.2 mg/dL — ABNORMAL LOW (ref 8.9–10.3)
Chloride: 85 mmol/L — ABNORMAL LOW (ref 98–111)
Creatinine, Ser: 2.33 mg/dL — ABNORMAL HIGH (ref 0.61–1.24)
GFR, Estimated: 28 mL/min — ABNORMAL LOW (ref >60)
Glucose, Bld: 226 mg/dL — ABNORMAL HIGH (ref 70–99)
Potassium: 3.5 mmol/L (ref 3.5–5.1)
Sodium: 124 mmol/L — ABNORMAL LOW (ref 135–145)

## 2024-03-31 LAB — GLUCOSE, CAPILLARY
Glucose-Capillary: 149 mg/dL — ABNORMAL HIGH (ref 70–99)
Glucose-Capillary: 252 mg/dL — ABNORMAL HIGH (ref 70–99)
Glucose-Capillary: 184 mg/dL — ABNORMAL HIGH (ref 70–99)
Glucose-Capillary: 172 mg/dL — ABNORMAL HIGH (ref 70–99)

## 2024-03-31 LAB — COOXEMETRY PANEL
Carboxyhemoglobin: 1 % (ref 0.5–1.5)
Carboxyhemoglobin: 1.3 % (ref 0.5–1.5)
Methemoglobin: <0.7 % (ref 0.0–1.5)
Methemoglobin: <0.7 % (ref 0.0–1.5)
O2 Saturation: 44.3 %
O2 Saturation: 47.7 %
Total hemoglobin: 11.2 g/dL — ABNORMAL LOW (ref 12.0–16.0)
Total hemoglobin: 11.2 g/dL — ABNORMAL LOW (ref 12.0–16.0)

## 2024-03-31 LAB — MAGNESIUM: Magnesium: 1.9 mg/dL (ref 1.7–2.4)

## 2024-03-31 MED ORDER — MAGNESIUM SULFATE 2 GM/50ML IV SOLN
2.0000 g | Freq: Once | INTRAVENOUS | Status: AC
  Administered 2024-03-31: 2 g via INTRAVENOUS
  Filled 2024-03-31: qty 50

## 2024-03-31 MED ORDER — POTASSIUM CHLORIDE CRYS ER 20 MEQ PO TBCR
30.0000 meq | EXTENDED_RELEASE_TABLET | ORAL | Status: AC
  Administered 2024-03-31 (×2): 30 meq via ORAL
  Filled 2024-03-31 (×2): qty 1

## 2024-03-31 MED ORDER — MELATONIN 5 MG PO TABS
5.0000 mg | ORAL_TABLET | Freq: Once | ORAL | Status: AC
  Administered 2024-03-31: 5 mg via ORAL
  Filled 2024-03-31: qty 1

## 2024-03-31 MED ORDER — ONDANSETRON HCL 4 MG/2ML IJ SOLN
4.0000 mg | Freq: Once | INTRAMUSCULAR | Status: AC
  Administered 2024-03-31: 4 mg via INTRAVENOUS
  Filled 2024-03-31: qty 2

## 2024-03-31 NOTE — TOC Initial Note (Signed)
 Transition of Care [facility]) - Initial/Assessment Note    Patient Details  Name: Jermaine Kramer MRN: [id number] Date of Birth: July 11, 1950  Transition [facility]) CM/SW Contact:    Arlana JINNY Nicholaus ISRAEL Phone Number: [phone] 03/31/2024, 11:54 AM  Clinical Narrative:     11:28 AM- HF CSW attempted to meet with patient at bedside. Patient was being seen by medical staff. CSW/CM will follow up at a more appropriate time.   HF CSW/CM will conitnue to follow and monitor dc readiness.                     Patient Goals and CMS Choice            Expected Discharge Plan and Services                                              Prior Living Arrangements/Services                       Activities of Daily Living   ADL Screening (condition at time of admission) Independently performs ADLs?: Yes (appropriate for developmental age) Is the patient deaf or have difficulty hearing?: Yes Does the patient have difficulty seeing, even when wearing glasses/contacts?: No Does the patient have difficulty concentrating, remembering, or making decisions?: No  Permission Sought/Granted                  Emotional Assessment              Admission diagnosis:  CHF (congestive heart failure) (HCC) [I50.9] Patient Active Problem List   Diagnosis Date Noted   Acute on chronic systolic heart failure (HCC) 03/26/2024   CKD stage 3b, GFR 30-44 ml/min (HCC) 03/20/2024   Acute exacerbation of CHF (congestive heart failure) (HCC) 03/19/2024   DM2 (diabetes mellitus, type 2) (HCC) 02/12/2024   CHF (congestive heart failure) (HCC) 01/19/2024   UTI (urinary tract infection) 06/09/2022   Abdominal pain 06/09/2022   Elevated lactic acid level 06/09/2022   Gastroenteritis and colitis, viral 10/05/2021   Acute gastroenteritis, rotavirus 10/04/2021   AKI (acute kidney injury) with metabolic acidosis(HCC) 10/04/2021   Diabetes mellitus without complication (HCC)     Hypokalemia    Hypotension    Dental caries    Acute pulmonary edema (HCC)    Left flank pain 07/24/2021   Stroke (HCC) 07/24/2021   Chronic combined systolic and diastolic congestive heart failure (HCC) 07/24/2021   Atrial fibrillation, chronic (HCC) 07/24/2021   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   Kidney stone on left side 07/24/2021   HFrEF (heart failure with reduced ejection fraction) (HCC) 06/22/2021   Ischemic cardiomyopathy 06/22/2021   CAD s/p CABG x 3 06/22/2021   Hyperlipidemia LDL goal <70 06/22/2021   Acute CHF (congestive heart failure) (HCC) 05/14/2021   Incomplete emptying of bladder 05/04/2021   Acute respiratory failure with hypoxia (HCC)    Troponin I above reference range    COVID 04/21/2021   Postoperative atrial fibrillation (HCC) 04/21/2021   GERD (gastroesophageal reflux disease)    Hypertension    Acute parotitis 02/27/2021   Volume overload 02/19/2021   Thrombocytopenia 02/18/2021   Receiving inotropic medication 02/18/2021   Postoperative pain 02/18/2021   On enteral nutrition 02/18/2021   Postoperative anemia due to acute blood loss 02/18/2021   Altered mental status  02/18/2021   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 [place] - [address] [place] [address] Phone: [phone] Fax:  [phone]  [id number] [place], [place] - [address] [place] [address] Phone: [phone] Fax: [phone]  [facility] [address] [place] [address] Phone: [phone] Fax: [phone]  [facility] - [facility] [address] [place] [address] Phone: [phone] Fax: [phone]     Social Drivers of Health (SDOH) Social History: SDOH Screenings   Food Insecurity: No Food Insecurity (03/28/2024)  Housing: Low Risk  (03/28/2024)  Transportation Needs: No Transportation Needs (03/28/2024)  Utilities: Not At Risk (03/28/2024)  Depression (PHQ2-9): Low Risk  (01/31/2022)  Financial Resource Strain: Low Risk  (03/25/2024)   Received from [facility]  Social Connections: Moderately Integrated (03/28/2024)  Tobacco Use: Low Risk  (03/25/2024)  Recent Concern: Tobacco Use - Medium Risk (03/19/2024)   Received from [facility]   SDOH Interventions:     Readmission Risk Interventions    03/20/2024    2:33 PM 02/12/2024   12:56 PM 06/11/2022   11:05 AM  Readmission Risk Prevention Plan  Transportation Screening Complete Complete Complete  PCP or Specialist Appt within 3-5 Days  Complete   Social Work Consult for Recovery Care Planning/Counseling  Complete   Palliative Care Screening  Not Applicable   Medication Review Oceanographer) Complete Complete Complete  PCP or Specialist appointment within 3-5 days of discharge Complete  Complete  SW Recovery Care/Counseling Consult Complete  Complete  Palliative Care Screening Not Applicable  Not Applicable  Skilled Nursing Facility Not Applicable  Not Applicable

## 2024-03-31 NOTE — Plan of Care (Signed)

## 2024-03-31 NOTE — Progress Notes (Signed)
 Orthopedic Tech Progress Note Patient Details:  Jermaine Kramer 10-19-1950 [id number]  Removed previous UNNA BOOTS, legs look great. RN stated we could wait to MD makes their rounds to see if they would like to continue UNNA BOOTS or switch over to [facility]   Patient ID: Jermaine Kramer, male   DOB: [birth year], 73 y.o.   MRN: [id number]  Jermaine Kramer Pac 03/31/2024, 9:02 AM

## 2024-03-31 NOTE — Progress Notes (Signed)
 RT called to room because patient requesting breathing treatment for wheezing.  Upon arrival, clear /diminished bilateral breath sounds auscultated.  Patient stated he needed the treatment for coughing;explained that treatment would not help coughing spells and RN would get him some medication for the cough.  Patient still wanted the albuterol  treatment; RT administered and RN was made aware. RT will continue to monitor.

## 2024-03-31 NOTE — Progress Notes (Signed)
 Advanced Heart Failure Rounding Note  HF Cardiologist: Dr. Rolan  Chief Complaint: HF Subjective:    Remains on DBA 5 and lasix  gtt at 30   Weiught down another 2 pounds - 8 pounds todal SBP remain in 80s  Co-ox 44% -> 48% CVP13 (checked personally)  Scr 2.3 -> 2.2 -> 2.3  Serum Na 122 -124 after tolvaptan   Objective:   Weight Range: 80 kg Body mass index is 29.35 kg/m.   Vital Signs:   Temp:  [96.6 F (35.9 C)-98.3 F (36.8 C)] 97.4 F (36.3 C) (12/01 0804) Pulse Rate:  [68-75] 73 (12/01 0804) Resp:  [18-20] 19 (12/01 0804) BP: (88-101)/(50-77) 88/59 (12/01 0804) SpO2:  [90 %-96 %] 95 % (12/01 0804) Weight:  [80 kg] 80 kg (12/01 0426) Last BM Date : 03/30/24  Weight change: Filed Weights   03/29/24 0343 03/30/24 0500 03/31/24 0426  Weight: 81.7 kg 80.9 kg 80 kg    Intake/Output:   Intake/Output Summary (Last 24 hours) at 03/31/2024 0947 Last data filed at 03/31/2024 0806 Gross per 24 hour  Intake 740 ml  Output 1450 ml  Net -710 ml      Physical Exam    General:  Sitting up in chair No resp difficulty HEENT: normal Neck: supple. Jvp to jaw  Cor: Regular rate & rhythm. No rubs, gallops or murmurs. Lungs: clear Abdomen: soft, nontender, + distended.Good bowel sounds. Extremities: no cyanosis, clubbing, rash, edema Neuro: alert & orientedx3, cranial nerves grossly intact. moves all 4 extremities w/o difficulty. Affect pleasant  Telemetry   Sinus 70s + PVCs Personally reviewed   Labs    CBC No results for input(s): WBC, NEUTROABS, HGB, HCT, MCV, PLT in the last 72 hours.  Basic Metabolic Panel Recent Labs    88/69/74 0636 03/30/24 1816 03/31/24 0500  NA 121* 122* 124*  K 3.2* 3.6 3.5  CL 80* 81* 85*  CO2 27 29 27   GLUCOSE 166* 157* 226*  BUN 43* 44* 43*  CREATININE 2.23* 2.30* 2.33*  CALCIUM  8.6* 8.5* 8.2*  MG 2.0  --  1.9   Liver Function Tests Recent Labs    03/30/24 1159  AST 20  ALT 11  ALKPHOS 65   BILITOT 1.2  PROT 6.5  ALBUMIN  3.5    No results for input(s): LIPASE, AMYLASE in the last 72 hours. Cardiac Enzymes No results for input(s): CKTOTAL, CKMB, CKMBINDEX, TROPONINI in the last 72 hours.  BNP: BNP (last 3 results) Recent Labs    02/01/24 1118 02/09/24 0921 03/26/24 1455  BNP 941.8* 1,871.3* 2,129.6*    ProBNP (last 3 results) Recent Labs    03/19/24 1038 03/25/24 1249  PROBNP 19,467.0* 20,964.0*     D-Dimer No results for input(s): DDIMER in the last 72 hours. Hemoglobin A1C No results for input(s): HGBA1C in the last 72 hours. Fasting Lipid Panel No results for input(s): CHOL, HDL, LDLCALC, TRIG, CHOLHDL, LDLDIRECT in the last 72 hours. Thyroid Function Tests No results for input(s): TSH, T4TOTAL, T3FREE, THYROIDAB in the last 72 hours.  Invalid input(s): FREET3   Other results:   Imaging    No results found.    Medications:     Scheduled Medications:  apixaban   5 mg Oral BID   atorvastatin   80 mg Oral Daily   Chlorhexidine  Gluconate Cloth  6 each Topical Daily   insulin  aspart  0-9 Units Subcutaneous TID WC   pantoprazole   80 mg Oral Daily   sodium chloride  flush  10-40 mL Intracatheter Q12H   sodium chloride  flush  3 mL Intravenous Q12H   tamsulosin   0.4 mg Oral QPC supper    Infusions:  amiodarone  30 mg/hr (03/30/24 2325)   DOBUTamine 5 mcg/kg/min (03/30/24 0912)   furosemide  (LASIX ) 200 mg in dextrose  5 % 100 mL (2 mg/mL) infusion 30 mg/hr (03/31/24 0342)    PRN Medications: acetaminophen , albuterol , dextromethorphan -guaiFENesin , melatonin, methocarbamol , ondansetron  (ZOFRAN ) IV, sodium chloride  flush, sodium chloride  flush    Assessment/Plan   1. Acute on chronic biventricular heart failure: Ischemic cardiomyopathy.  Echo (5/23) with EF 30-35%, severe LV dilation, mild MR, low normal RV systolic function. He has a Medtronic ICD and a cardiac contractility modulator. Echo in 7/24  showed EF 20-25% with mild RV dysfunction.  Echo in 11/25 (done at [facility] so cannot review) showed EF 25%, mild RV dysfunction, moderate MR.  He had a RHC in 10/25 with markedly low cardiac output and markedly high right and left heart filling pressures.  GDMT titration has been complicated by CKD stage 3b and soft BP.  He has RBBB-like IVCD, have discussed with EP and unlikely to benefit from CRT upgrade.  He was just discharged from a hospitalization for CHF exacerbation. - Repeat echo: EF < 20%, RV severely reduced, moderate BAE, mild to moderate MR - On DBA 2.5 mcg/kg/min. Co-ox low 47%% SBP 80-90s. Increase DBA to 7.5 - On lasix  gtt at 30. CVP 13. Will continue - Off GDMT with low blood pressure and CKD. On midodrine  at home - Off beta blocker with low output.  - He had a severe groin yeast infection shortly after starting Jardiance so will not rechallenge with SGLT2 inhibitor.  - With markedly low cardiac output on RHC in 10/25, we need to at least consider advance therapies (LVAD).  The main barriers are his kidney function and possibly RV dysfunction with low PAPi on recent RHC.  He will need repeat RHC after diuresis.  We will need to see how he responds to milrinone .  If creatinine improves with diuresis + milrinone  and PAPi appears reasonable, he may be a candidate for LVAD but I suspect this is unlikely given current course  - Co-ox dropping despite milrinone . SCr > 2.0. He has end-stage (Stage D HF). Only options left are home inotropes vs pallaitive care. Given low-co-ox on DBA 5 suspect no real role for home inotropes. Will attempt to increase DBA to 7.5. Palliative consult   2. CAD: s/p CABG x 3 in 10/22. - No s/s angina - Continue atorvastatin  80 mg daily.  - No ASA given stable CAD and apixaban  use.    3. CKD: stage IV.  Follows with nephrology.  Baseline creatinine 2.3   - Scr 2.7 -> 2.5 -> 2.3 -> 2.3 -> 2.2 -> 2.3 with inotropes and IV diuresis - follow daily   4.  Atrial fibrillation: Paroxysmal.  Device interrogation 11/24 shows occasional AF runs, generally short.  He is now off amiodarone .  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 Will need to stop eliquis  and start bival prior to any invasive workup/procedures.   6. Hyponatremia: Limit fluid intake - In setting of massive volume overload - NA 124  Received tolvaptan yesterday with mild benefit  7. Hypokalemia - k 3.5 supp      Length of Stay: 5  Toribio Fuel, MD  03/31/2024, 9:47 AM  Advanced Heart Failure Team Pager [phone] (M-F; 7a - 5p)  Please contact [facility] for night-coverage after hours (5p -7a ) and weekends on [email or web address]

## 2024-03-31 NOTE — Plan of Care (Signed)
  Problem: Education: Goal: Knowledge of General Education information will improve Description: Including pain rating scale, medication(s)/side effects and non-pharmacologic comfort measures Outcome: Progressing   Problem: Health Behavior/Discharge Planning: Goal: Ability to manage health-related needs will improve Outcome: Progressing   Problem: Clinical Measurements: Goal: Will remain free from infection Outcome: Progressing   Problem: Nutrition: Goal: Adequate nutrition will be maintained Outcome: Progressing   Problem: Safety: Goal: Ability to remain free from injury will improve Outcome: Progressing   Problem: Coping: Goal: Ability to adjust to condition or change in health will improve Outcome: Progressing

## 2024-03-31 DEATH — deceased

## 2024-04-01 DIAGNOSIS — E871 Hypo-osmolality and hyponatremia: Secondary | ICD-10-CM

## 2024-04-01 DIAGNOSIS — N189 Chronic kidney disease, unspecified: Secondary | ICD-10-CM

## 2024-04-01 DIAGNOSIS — I5082 Biventricular heart failure: Secondary | ICD-10-CM

## 2024-04-01 DIAGNOSIS — I5023 Acute on chronic systolic (congestive) heart failure: Secondary | ICD-10-CM | POA: Diagnosis not present

## 2024-04-01 LAB — CBC
HCT: 32.2 % — ABNORMAL LOW (ref 39.0–52.0)
Hemoglobin: 10.4 g/dL — ABNORMAL LOW (ref 13.0–17.0)
MCH: 24.3 pg — ABNORMAL LOW (ref 26.0–34.0)
MCHC: 32.3 g/dL (ref 30.0–36.0)
MCV: 75.2 fL — ABNORMAL LOW (ref 80.0–100.0)
Platelets: 155 K/uL (ref 150–400)
RBC: 4.28 MIL/uL (ref 4.22–5.81)
RDW: 16.2 % — ABNORMAL HIGH (ref 11.5–15.5)
WBC: 5.7 K/uL (ref 4.0–10.5)
nRBC: 0 % (ref 0.0–0.2)

## 2024-04-01 LAB — COOXEMETRY PANEL
Carboxyhemoglobin: 1.1 % (ref 0.5–1.5)
Methemoglobin: 0.8 % (ref 0.0–1.5)
O2 Saturation: 49.1 %
Total hemoglobin: 10.9 g/dL — ABNORMAL LOW (ref 12.0–16.0)

## 2024-04-01 LAB — BASIC METABOLIC PANEL WITH GFR
Anion gap: 11 (ref 5–15)
BUN: 53 mg/dL — ABNORMAL HIGH (ref 8–23)
CO2: 28 mmol/L (ref 22–32)
Calcium: 8.5 mg/dL — ABNORMAL LOW (ref 8.9–10.3)
Chloride: 84 mmol/L — ABNORMAL LOW (ref 98–111)
Creatinine, Ser: 2.98 mg/dL — ABNORMAL HIGH (ref 0.61–1.24)
GFR, Estimated: 21 mL/min — ABNORMAL LOW (ref >60)
Glucose, Bld: 234 mg/dL — ABNORMAL HIGH (ref 70–99)
Potassium: 4.2 mmol/L (ref 3.5–5.1)
Sodium: 123 mmol/L — ABNORMAL LOW (ref 135–145)

## 2024-04-01 LAB — GLUCOSE, CAPILLARY
Glucose-Capillary: 181 mg/dL — ABNORMAL HIGH (ref 70–99)
Glucose-Capillary: 215 mg/dL — ABNORMAL HIGH (ref 70–99)
Glucose-Capillary: 201 mg/dL — ABNORMAL HIGH (ref 70–99)
Glucose-Capillary: 155 mg/dL — ABNORMAL HIGH (ref 70–99)

## 2024-04-01 NOTE — Progress Notes (Addendum)
 Advanced Heart Failure Rounding Note  HF Cardiologist: Dr. Rolan  Chief Complaint: HF Subjective:    12/1: DBA increased to 7.5 mcg for low co-ox.   Co-ox remains low despite DBA increase, 49%. FICK CI 1.55.   Only 1.2L in UOP yesterday w/ Lasix  gtt at 30/hr. CVP 12-13 today. BMP pending.   OOB, sitting up in chair. Denies any current resting dyspnea but feels tired/fatigue. Also w/ nausea and poor appetite. No vomiting.    Objective:   Weight Range: 81 kg Body mass index is 29.72 kg/m.   Vital Signs:   Temp:  [97.4 F (36.3 C)-98.4 F (36.9 C)] 98.4 F (36.9 C) (12/02 0448) Pulse Rate:  [71-73] 71 (12/02 0448) Resp:  [17-20] 17 (12/02 0448) BP: (88-96)/(58-73) 95/68 (12/02 0448) SpO2:  [93 %-97 %] 96 % (12/01 2021) Weight:  [81 kg] 81 kg (12/02 0448) Last BM Date : 03/31/24  Weight change: Filed Weights   03/30/24 0500 03/31/24 0426 04/01/24 0448  Weight: 80.9 kg 80 kg 81 kg    Intake/Output:   Intake/Output Summary (Last 24 hours) at 04/01/2024 9287 Last data filed at 04/01/2024 9348 Gross per 24 hour  Intake 1380 ml  Output 1150 ml  Net 230 ml      Physical Exam    GENERAL: fatigued appearing, NAD Lungs- diminished at the bases bilaterally  CARDIAC:  JVP elevated to jaw         Normal rate with regular rhythm. No MRG. No LEE  ABDOMEN: + distended. NT   EXTREMITIES: Warm and well perfused.  NEUROLOGIC: No obvious FND   Telemetry   NSR 70s, Personally reviewed   Labs    CBC No results for input(s): WBC, NEUTROABS, HGB, HCT, MCV, PLT in the last 72 hours.  Basic Metabolic Panel Recent Labs    88/69/74 0636 03/30/24 1816 03/31/24 0500  NA 121* 122* 124*  K 3.2* 3.6 3.5  CL 80* 81* 85*  CO2 27 29 27   GLUCOSE 166* 157* 226*  BUN 43* 44* 43*  CREATININE 2.23* 2.30* 2.33*  CALCIUM  8.6* 8.5* 8.2*  MG 2.0  --  1.9   Liver Function Tests Recent Labs    03/30/24 1159  AST 20  ALT 11  ALKPHOS 65  BILITOT 1.2  PROT  6.5  ALBUMIN  3.5    No results for input(s): LIPASE, AMYLASE in the last 72 hours. Cardiac Enzymes No results for input(s): CKTOTAL, CKMB, CKMBINDEX, TROPONINI in the last 72 hours.  BNP: BNP (last 3 results) Recent Labs    02/01/24 1118 02/09/24 0921 03/26/24 1455  BNP 941.8* 1,871.3* 2,129.6*    ProBNP (last 3 results) Recent Labs    03/19/24 1038 03/25/24 1249  PROBNP 19,467.0* 20,964.0*     D-Dimer No results for input(s): DDIMER in the last 72 hours. Hemoglobin A1C No results for input(s): HGBA1C in the last 72 hours. Fasting Lipid Panel No results for input(s): CHOL, HDL, LDLCALC, TRIG, CHOLHDL, LDLDIRECT in the last 72 hours. Thyroid Function Tests No results for input(s): TSH, T4TOTAL, T3FREE, THYROIDAB in the last 72 hours.  Invalid input(s): FREET3   Other results:   Imaging    No results found.    Medications:     Scheduled Medications:  apixaban   5 mg Oral BID   atorvastatin   80 mg Oral Daily   Chlorhexidine  Gluconate Cloth  6 each Topical Daily   insulin  aspart  0-9 Units Subcutaneous TID WC   pantoprazole   80 mg  Oral Daily   sodium chloride  flush  10-40 mL Intracatheter Q12H   sodium chloride  flush  3 mL Intravenous Q12H   tamsulosin   0.4 mg Oral QPC supper    Infusions:  amiodarone  30 mg/hr (04/01/24 0026)   DOBUTamine 7.5 mcg/kg/min (03/31/24 1446)   furosemide  (LASIX ) 200 mg in dextrose  5 % 100 mL (2 mg/mL) infusion 30 mg/hr (04/01/24 0025)    PRN Medications: acetaminophen , albuterol , dextromethorphan -guaiFENesin , melatonin, methocarbamol , ondansetron  (ZOFRAN ) IV, sodium chloride  flush, sodium chloride  flush    Assessment/Plan   1. Acute on chronic biventricular heart failure: Ischemic cardiomyopathy.  Echo (5/23) with EF 30-35%, severe LV dilation, mild MR, low normal RV systolic function. He has a [employer] ICD and a cardiac contractility modulator. Echo in 7/24 showed EF 20-25% with  mild RV dysfunction.  Echo in 11/25 (done at [facility] so cannot review) showed EF 25%, mild RV dysfunction, moderate MR.  He had a RHC in 10/25 with markedly low cardiac output and markedly high right and left heart filling pressures.  GDMT titration has been complicated by CKD stage 3b and soft BP.  He has RBBB-like IVCD, have discussed with EP and unlikely to benefit from CRT upgrade.  He was just discharged from a hospitalization for CHF exacerbation. - Repeat echo: EF < 20%, RV severely reduced, moderate BAE, mild to moderate MR - On DBA 7.5 mcg/kg/min. Co-ox remains low 49%, FICK CI 1.55  - On lasix  gtt at 30. CVP 13. BMP pending  - Off GDMT with low blood pressure and CKD. On midodrine  at home - Off beta blocker with low output.  - He had a severe groin yeast infection shortly after starting Jardiance so will not rechallenge with SGLT2 inhibitor.   - Co-ox remains low despite high dose DBA support. SCr > 2.0. He has end-stage (Stage D HF). Unlikely to be good VAD candidate given renal dysfunction and low PAPi on recent RHC. Only options left are home inotropes vs pallaitive care. Awaiting palliative care team consult for GOC discussion.    2. CAD: s/p CABG x 3 in 10/22. - No s/s angina - Continue atorvastatin  80 mg daily.  - No ASA given stable CAD and apixaban  use.    3. CKD: stage IV.  Follows with nephrology.  Baseline creatinine 2.3   - Scr 2.7 -> 2.5 -> 2.3 -> 2.3 -> 2.2 -> 2.3->pending with inotropes and IV diuresis - follow daily   4. Atrial fibrillation: Paroxysmal.  Device interrogation 11/24 shows occasional AF runs, generally short.   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 Will need to stop eliquis  and start bival prior to any invasive workup/procedures.   6. Hyponatremia: Limit fluid intake - In setting of massive volume overload - Received tolvaptan with mild  benefit. Repeat BMP pending   7. Hypokalemia - k 3.5 yesterday  - f/u BMP pending      Length of Stay: [address], PA-C  04/01/2024, 7:12 AM  Advanced Heart Failure Team Pager [phone] (M-F; 7a - 5p)  Please contact [facility] for night-coverage after hours (5p -7a ) and weekends on [email or web address]  Patient seen and examined with the above-signed Advanced Practice Provider and/or Housestaff. I personally reviewed laboratory data, imaging studies and relevant notes. I independently examined the patient and formulated the important aspects of the plan. I have edited the note to  reflect any of my changes or salient points. I have personally discussed the plan with the patient and/or family.  DBA increased to 7.5 yesterday. Remains on lasix  gtt at 30%  Co-ox remains low at 49% CVP 12-13. Feel nauseous   General:  Sitting in chair No resp difficulty HEENT: normal Neck: supple. JVP jaw Cor: Regular rate & rhythm. No rubs, gallops or murmurs. Lungs: clear Abdomen: soft, nontender, nondistended.Good bowel sounds. Extremities: no cyanosis, clubbing, rash, tr-1+ edema Neuro: alert & orientedx3, cranial nerves grossly intact. moves all 4 extremities w/o difficulty. Affect pleasant  Co-ox remains low despite high-dose DBA. I am not sure inortopes are benefitting him much at this point. Will wean DBA slowly. If no change in symptoms will stop. If feels worse can consider home with palliative DBA vs home hospice  Await BMET this am. Continue lasix  for now.   Toribio Fuel, MD  8:23 AM

## 2024-04-01 NOTE — Plan of Care (Signed)
  Problem: Education: Goal: Knowledge of General Education information will improve Description: Including pain rating scale, medication(s)/side effects and non-pharmacologic comfort measures Outcome: Progressing   Problem: Clinical Measurements: Goal: Diagnostic test results will improve Outcome: Progressing   Problem: Clinical Measurements: Goal: Cardiovascular complication will be avoided Outcome: Progressing   Problem: Clinical Measurements: Goal: Respiratory complications will improve Outcome: Progressing

## 2024-04-01 NOTE — Progress Notes (Signed)
 Reassessed pt after DBA decrease to 5 mcg.  Pt reports feeling the same. No worse. Sitting up in chair. No resting dyspnea.   Continue DBA 5  Caffie Shed, PA-C

## 2024-04-01 NOTE — Care Management Important Message (Signed)
 Important Message  Patient Details  Name: Jermaine Kramer MRN: [id number] Date of Birth: 25-Jan-1951   Important Message Given:  Yes - Medicare IM     [name] 04/01/2024, 12:10 PM

## 2024-04-01 NOTE — TOC Initial Note (Signed)
 Transition of Care [name]) - Initial/Assessment Note    Patient Details  Name: Jermaine Kramer MRN: [id number] Date of Birth: 02-Dec-1950  [facility] CM/SW Contact:    Arlana JINNY Nicholaus ISRAEL Phone Number: [phone] 04/01/2024, 12:32 PM  Clinical Narrative:  HF CSW met with patient and his daughter at bedside. Patient stated that he lives with wife, daughter, and daughter. Patient stated that he has no history of HH services. Patient stated that he uses a walker. Patient stated he has a scale at home. Patient stated that he has a PCP. CSW explained that a hospital follow up is typically scheduled closer towards dc. Patient is agreeable. Patient stated that family will provide transportation at dc.   HF CSW/CM will continue to follow and monitor for dc readiness.                        Patient Goals and CMS Choice            Expected Discharge Plan and Services                                              Prior Living Arrangements/Services                       Activities of Daily Living   ADL Screening (condition at time of admission) Independently performs ADLs?: Yes (appropriate for developmental age) Is the patient deaf or have difficulty hearing?: Yes Does the patient have difficulty seeing, even when wearing glasses/contacts?: No Does the patient have difficulty concentrating, remembering, or making decisions?: No  Permission Sought/Granted                  Emotional Assessment              Admission diagnosis:  CHF (congestive heart failure) (HCC) [I50.9] Patient Active Problem List   Diagnosis Date Noted   Acute on chronic systolic heart failure (HCC) 03/26/2024   CKD stage 3b, GFR 30-44 ml/min (HCC) 03/20/2024   Acute exacerbation of CHF (congestive heart failure) (HCC) 03/19/2024   DM2 (diabetes mellitus, type 2) (HCC) 02/12/2024   CHF (congestive heart failure) (HCC) 01/19/2024   UTI (urinary tract infection)  06/09/2022   Abdominal pain 06/09/2022   Elevated lactic acid level 06/09/2022   Gastroenteritis and colitis, viral 10/05/2021   Acute gastroenteritis, rotavirus 10/04/2021   AKI (acute kidney injury) with metabolic acidosis(HCC) 10/04/2021   Diabetes mellitus without complication (HCC)    Hypokalemia    Hypotension    Dental caries    Acute pulmonary edema (HCC)    Left flank pain 07/24/2021   Stroke (HCC) 07/24/2021   Chronic combined systolic and diastolic congestive heart failure (HCC) 07/24/2021   Atrial fibrillation, chronic (HCC) 07/24/2021   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   Kidney stone on left side 07/24/2021   HFrEF (heart failure with reduced ejection fraction) (HCC) 06/22/2021   Ischemic cardiomyopathy 06/22/2021   CAD s/p CABG x 3 06/22/2021   Hyperlipidemia LDL goal <70 06/22/2021   Acute CHF (congestive heart failure) (HCC) 05/14/2021   Incomplete emptying of bladder 05/04/2021   Acute respiratory failure with hypoxia (HCC)    Troponin I above reference range    COVID 04/21/2021   Postoperative atrial fibrillation (HCC) 04/21/2021   GERD (gastroesophageal reflux disease)  Hypertension    Acute parotitis 02/27/2021   Volume overload 02/19/2021   Thrombocytopenia 02/18/2021   Receiving inotropic medication 02/18/2021   Postoperative pain 02/18/2021   On enteral nutrition 02/18/2021   Postoperative anemia due to acute blood loss 02/18/2021   Altered mental status 02/18/2021   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   Bradycardia 12/10/2020   Epididymo-orchitis 11/30/2020   Urinary retention 04/09/2020   Obesity (BMI 30.0-34.9) 05/14/2017   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 [place] [address] Phone: [phone] Fax: [phone]  [id number] [place], [place] - [address] [place] [address] Phone: [phone] Fax: [phone]  [facility] [address] [place] [address] Phone: [phone] Fax: [phone]  [facility] - [facility] [address] [place] [address] Phone: [phone] Fax: [phone]     Social Drivers of Health (SDOH) Social History: SDOH Screenings   Food Insecurity: No Food Insecurity (03/28/2024)  Housing: Low Risk  (03/28/2024)  Transportation Needs: No Transportation Needs (03/28/2024)  Utilities: Not At Risk (03/28/2024)  Depression (PHQ2-9): Low Risk  (01/31/2022)  Financial Resource Strain: Low Risk  (03/25/2024)   Received from [facility]  Social Connections: Moderately Integrated (03/28/2024)  Tobacco Use: Low Risk  (03/25/2024)  Recent Concern: Tobacco Use - Medium Risk (03/19/2024)   Received from [facility]   SDOH Interventions:     Readmission Risk Interventions    03/20/2024    2:33 PM 02/12/2024   12:56 PM 06/11/2022   11:05 AM  Readmission Risk Prevention Plan  Transportation Screening Complete Complete Complete  PCP or Specialist Appt within 3-5 Days  Complete   Social Work Consult for Recovery Care Planning/Counseling  Complete   Palliative Care Screening  Not Applicable   Medication Review Oceanographer) Complete Complete Complete  PCP or Specialist appointment within 3-5 days of discharge Complete  Complete  SW Recovery Care/Counseling Consult Complete  Complete  Palliative Care Screening Not Applicable  Not Applicable   Skilled Nursing Facility Not Applicable  Not Applicable

## 2024-04-01 NOTE — Consult Note (Signed)
 Palliative Care Consult Note                                  Date: 04/01/2024   Patient Name: Jermaine Kramer  DOB:01/29/1951  FMW:[id number]  Age / Sex:73 y.o., male  PCP: Jermaine Constance Sor, Kramer-C Referring Physician: Cherrie Toribio SAUNDERS, MD  Reason for Consultation: Establishing goals of care  Past Medical History:  Diagnosis Date   Arrhythmia    atrial fibrillation   CHF (congestive heart failure) (HCC)    Coronary artery disease    Diabetes mellitus without complication (HCC)    GERD (gastroesophageal reflux disease)    Hypercholesteremia    Hypertension    Sleep apnea    Stroke [facility])    2015 or longer ago     Assessment & Plan:   HPI/Patient Profile: 73 y.o. male  with past medical history of CAD s/p CABG x3 (2022), atrial fibrillation, CKD, HFrEF (<20%) admitted on 03/26/2024 with acute on chronic biventricular heart failure with low-output.   Per H&P on 11/26 by Bensimhon MD patient presented to heart failure clinic on 11/24 after discharge from [facility] (11/19-11/24) with volume overload. Patient had a recent right heart cath that is concerning for end stage heart failure with consideration for LVAD. Patient has had about 4 admissions in the last 2 months for congestive heart failure.  Per progress note on 12/01 by Bensimhon MD after days of diuresing as well as inotrope adjustment from milrinone  to dobutamine without much improvement in patient's fatigue and shortness of breath along with patient's elevated creatinine remains >2 precludes patient from further advanced therapies such as LVAD. Plan to continue to titrate dobutamine but most likely path for patient currently is palliative dobutamine vs hospice.   Palliative medicine consulted for goals of care conversation.   SUMMARY OF RECOMMENDATIONS   Full code, full scope, Continue to discuss code status and limitations of care as family emotionally able to do so Patient  and family not ready to discuss hospice at this time, will continue goals of care conversation  Symptom Management:  Per primary team  Code Status: Full Code  Prognosis:  Unable to determine  Discharge Planning:  To Be Determined   Discussed with: Jermaine Kramer about the patient and family not being ready to discuss hospice at this time and PMT will continue to follow.   Subjective:   Reviewed medical records, received report from team, assessed the patient and then meet at the patient's bedside to discuss diagnosis, prognosis, GOC, EOL wishes disposition and options.  ECHO on 03/27/2024 with estimated EF < 20%, global LV hypokinesis, RV function severely reduced. Current Cr at 2.98, increased from admission of 2.33.   I met with patient with wife Jermaine Kramer) and daughter at the bedside. Dr. Oneita of PMT was present during visit.    We meet to discuss diagnosis prognosis, GOC, EOL wishes, disposition and options. Concept of Palliative Care was introduced as specialized medical care for people and their families living with serious illness.  If focuses on providing relief from the symptoms and stress of a serious illness.  The goal is to improve quality of life for both the patient and the family. Values and goals of care important to patient and family were attempted to be elicited.  Created space and opportunity for patient  and family to explore thoughts and feelings regarding current medical situation   Natural trajectory  and current clinical status were discussed. Questions and concerns addressed. Patient encouraged to call with questions or concerns.    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 - Shares that  he enjoys watching TV mainly during his free time  Baseline Status: - Lives at home with his wife and helps care for his grandchildren - Is able to follow his wife as she tends to errands and shopping prior to admission in a powered scooter  Today's Discussion: - Discussed the patient's health prior to admission, current admission and briefly spoke about future steps regarding his heart failure - Family shares that patient had a lot of water  weight and was at one point 199 lbs when he was hospitalized at [facility], but now he is down to around 180 lbs, but even with that loss of volume patient reports that he still feels tired and short of breath - Patient shared that he would like to continue living for as long as he can and became emotional when discussing his overall condition - Inquired about discussion with heart failure team and patient and family shares that they are appreciative of the heart failure team visiting with them daily even though the news they received were not good news, but did not expand on the information as they seemed unready to discuss the possibility of end of life at this time - Inquired about what the plan was for dobutamine and reiterated that the heart failure team plans on weaning the dobutamine and will assess how the patient feels with the wean and that the plan is to discontinue dobutamine if the patient does not feel worse from a fatigue and shortness of breath standpoint - If the patient does feel worse from a constitution standpoint, then the plan will have to be to consider dobutamine at home - Wife expressed that she thinks that the patient will feel better when he can go home, family and patient educated that his overall medical problems may not improve when he does get home   Review of Systems  Constitutional:  Positive for fatigue.  Respiratory:  Positive for shortness of breath.   Gastrointestinal:  Positive for abdominal distention.    Objective:    Primary Diagnoses: Present on Admission: **None**   Vital Signs:  BP 92/63 (BP Location: Left Arm)   Pulse 69   Temp (!) 97.5 F (36.4 C) (Oral)   Resp 20   Ht 5' 5 (1.651 m)   Wt 81 kg   SpO2 97%   BMI 29.72 kg/m   Physical Exam Constitutional:      Appearance: He is ill-appearing.  HENT:     Head: Normocephalic.     Nose: Nose normal.  Eyes:     Extraocular Movements: Extraocular movements intact.  Cardiovascular:     Rate and Rhythm: Normal rate.  Pulmonary:     Effort: Pulmonary effort is normal.  Abdominal:     General: There is distension.  Neurological:     General: No focal deficit present.     Mental Status: He is alert.  Psychiatric:     Comments: Depressed    Palliative Assessment/Data: 40%    Thank you for allowing us  to participate in  the care of RANEN DOOLIN PMT will continue to support holistically.  Signed by: Fairy FORBES Shan DEVONNA Palliative Medicine Team  Team Phone # [phone] (Nights/Weekends)  04/01/2024, 12:41 PM    Attending addendum:  I saw and examined Mr. Munroe.  Chart reviewed including personal review of pertinent labs, imaging, and notes from other providers including primary service of heart failure.  Met today with patient, his wife, and his daughter in conjunction with Fairy Shan.  Discussed his understanding of medical disease, clinical course to this point in his hospitalization, and goals for his care moving forward.  He understands that he is currently being weaned off dobutamine.  His overall goal is to return home and spend time with family.  Also expressed, however, that he is not ready to check out.  We discussed incurable nature of his illness and working to maximize both the time and quality of his life.  He is agreeable to palliative care continuing to follow along and continuing conversation based upon his clinical course.  Details as above.  I was present for the duration of encounter including entirety  of patient encounter noted above.  I personally spent a total of 65 minutes in the care of the patient today including preparing to see the patient, getting/reviewing separately obtained history, performing a medically appropriate exam/evaluation, referring and communicating with other health care professionals, and documenting clinical information in the EHR.

## 2024-04-01 NOTE — TOC Progression Note (Addendum)
 Transition of Care [facility]) - Progression Note    Patient Details  Name: Jermaine Kramer MRN: [id number] Date of Birth: 1950-11-18  Transition of Care [facility]) CM/SW Contact  Justina Delcia Czar, RN Phone Number: [phone] 04/01/2024, 4:57 PM  Clinical Narrative:   Readmission 7 day/30 day  Spoke to pt and wife at bedside. Offered choice for HH, medicare.gov listing provided with ratings and placed on chart.  Pt agreeable to [facility] and [facility] for Home Dobutamine. Will need Home Health RN orders with F2F.  Pt states he has scale at home for daily weights. Has [facility] with HF booklet.   Will schedule PCP follow appt at time of dc.   Expected Discharge Plan: Home w Home Health Services Barriers to Discharge: Continued Medical Work up   Expected Discharge Plan and Services   Discharge Planning Services: CM Consult Post Acute Care Choice: Home Health     HH Arranged: RN [facility] Agency: Surveyor, [employer], [facility] Date [facility] Agency Contacted: 04/01/24 Time HH Agency Contacted: 1656 Representative spoke with at [facility] Agency: Arlina rep, Arna, [facility] rep, Pam   Social Drivers of Health (SDOH) Interventions SDOH Screenings   Food Insecurity: No Food Insecurity (03/28/2024)  Housing: Low Risk  (03/28/2024)  Transportation Needs: No Transportation Needs (03/28/2024)  Utilities: Not At Risk (03/28/2024)  Depression (PHQ2-9): Low Risk  (01/31/2022)  Financial Resource Strain: Low Risk  (03/25/2024)   Received from [facility]  Social Connections: Moderately Integrated (03/28/2024)  Tobacco Use: Low Risk  (03/25/2024)  Recent Concern: Tobacco Use - Medium Risk (03/19/2024)   Received from [facility]    Readmission Risk Interventions    03/20/2024    2:33 PM 02/12/2024   12:56 PM 06/11/2022   11:05 AM  Readmission Risk Prevention Plan  Transportation Screening Complete Complete Complete  PCP or Specialist Appt within 3-5  Days  Complete   Social Work Consult for Recovery Care Planning/Counseling  Complete   Palliative Care Screening  Not Applicable   Medication Review Oceanographer) Complete Complete Complete  PCP or Specialist appointment within 3-5 days of discharge Complete  Complete  SW Recovery Care/Counseling Consult Complete  Complete  Palliative Care Screening Not Applicable  Not Applicable  Skilled Nursing Facility Not Applicable  Not Applicable

## 2024-04-01 NOTE — Plan of Care (Signed)

## 2024-04-02 DIAGNOSIS — I5023 Acute on chronic systolic (congestive) heart failure: Secondary | ICD-10-CM | POA: Diagnosis not present

## 2024-04-02 LAB — GLUCOSE, CAPILLARY
Glucose-Capillary: 163 mg/dL — ABNORMAL HIGH (ref 70–99)
Glucose-Capillary: 200 mg/dL — ABNORMAL HIGH (ref 70–99)
Glucose-Capillary: 154 mg/dL — ABNORMAL HIGH (ref 70–99)
Glucose-Capillary: 159 mg/dL — ABNORMAL HIGH (ref 70–99)

## 2024-04-02 LAB — BASIC METABOLIC PANEL WITH GFR
Anion gap: 12 (ref 5–15)
BUN: 56 mg/dL — ABNORMAL HIGH (ref 8–23)
CO2: 27 mmol/L (ref 22–32)
Calcium: 8.5 mg/dL — ABNORMAL LOW (ref 8.9–10.3)
Chloride: 81 mmol/L — ABNORMAL LOW (ref 98–111)
Creatinine, Ser: 3.74 mg/dL — ABNORMAL HIGH (ref 0.61–1.24)
GFR, Estimated: 16 mL/min — ABNORMAL LOW (ref >60)
Glucose, Bld: 225 mg/dL — ABNORMAL HIGH (ref 70–99)
Potassium: 4.1 mmol/L (ref 3.5–5.1)
Sodium: 120 mmol/L — ABNORMAL LOW (ref 135–145)

## 2024-04-02 LAB — COOXEMETRY PANEL
Carboxyhemoglobin: 0.6 % (ref 0.5–1.5)
Methemoglobin: <0.7 % (ref 0.0–1.5)
O2 Saturation: 48.4 %
Total hemoglobin: 10.5 g/dL — ABNORMAL LOW (ref 12.0–16.0)

## 2024-04-02 MED ORDER — ALTEPLASE 2 MG IJ SOLR
2.0000 mg | Freq: Once | INTRAMUSCULAR | Status: AC | PRN
  Administered 2024-04-02: 2 mg
  Filled 2024-04-02: qty 2

## 2024-04-02 MED ORDER — METOLAZONE 5 MG PO TABS
5.0000 mg | ORAL_TABLET | Freq: Once | ORAL | Status: AC
  Administered 2024-04-02: 5 mg via ORAL
  Filled 2024-04-02: qty 1

## 2024-04-02 MED ORDER — TRAZODONE HCL 50 MG PO TABS
50.0000 mg | ORAL_TABLET | Freq: Every evening | ORAL | Status: DC | PRN
  Administered 2024-04-03: 50 mg via ORAL
  Filled 2024-04-02: qty 1

## 2024-04-02 MED ORDER — LORAZEPAM 0.5 MG PO TABS
0.5000 mg | ORAL_TABLET | Freq: Four times a day (QID) | ORAL | Status: DC | PRN
  Administered 2024-04-02 – 2024-04-03 (×3): 0.5 mg via ORAL
  Filled 2024-04-02 (×3): qty 1

## 2024-04-02 MED ORDER — HYDROXYZINE HCL 10 MG PO TABS
10.0000 mg | ORAL_TABLET | Freq: Three times a day (TID) | ORAL | Status: DC | PRN
  Administered 2024-04-02 – 2024-04-04 (×2): 10 mg via ORAL
  Filled 2024-04-02 (×2): qty 1

## 2024-04-02 NOTE — Progress Notes (Signed)
 Daily Progress Note   Date: 04/02/2024   Patient Name: Jermaine Kramer  DOB: [birth year]  MRN: [id number]  Age / Sex: 73 y.o., male  Attending Physician: Cherrie Toribio SAUNDERS, MD Primary Care Physician: Perri Constance Sor, PA-C Admit Date: 03/26/2024 Length of Stay: 7 days  Reason for Follow-up: Establishing goals of care  Past Medical History:  Diagnosis Date   Arrhythmia    atrial fibrillation   CHF (congestive heart failure) (HCC)    Coronary artery disease    Diabetes mellitus without complication (HCC)    GERD (gastroesophageal reflux disease)    Hypercholesteremia    Hypertension    Sleep apnea    Stroke [facility])    2015 or longer ago    Assessment & Plan:   HPI/Patient Profile:  73 y.o. male  with past medical history of CAD s/p CABG x3 (2022), atrial fibrillation, CKD, HFrEF (<20%) admitted on 03/26/2024 with acute on chronic biventricular heart failure with low-output.    Per H&P on 11/26 by Bensimhon MD patient presented to heart failure clinic on 11/24 after discharge from [facility] (11/19-11/24) with volume overload. Patient had a recent right heart cath that is concerning for end stage heart failure with consideration for LVAD. Patient has had about 4 admissions in the last 2 months for congestive heart failure.   Per progress note on 12/01 by Bensimhon MD after days of diuresing as well as inotrope adjustment from milrinone  to dobutamine  without much improvement in patient's fatigue and shortness of breath along with patient's elevated creatinine remains >2 precludes patient from further advanced therapies such as LVAD. Plan to continue to titrate dobutamine  but most likely path for patient currently is palliative dobutamine  vs hospice.    Palliative medicine consulted for goals of care conversation.   SUMMARY OF RECOMMENDATIONS DNR-limited Will be available to support patient with symptom management going forward  Symptom Management:  Ativan  for anxiety, agitation,  shortness of breath Atarax  for anxiety Trazodone  for insomnia  Code Status: DNR - Limited (DNR/DNI)  Prognosis: < 2 weeks  Discharge Planning: Anticipated Hospital Death  Discussed with: Bensimhon MD and Marcine PA from heart failure primary team about continued support for symptoms. Dr. Oneita of PMT present during visit.   Subjective:   Subjective: Chart Reviewed. Updates received. Patient Assessed. Created space and opportunity for patient  and family to explore thoughts and feelings regarding current medical situation.  Today's Discussion:  Met with patient and many family members at the bedside. Patient emotional when discussing that he anticipates a hospital death after conversations with heart failure team. They shared that his ICD will be deactivated and that he does not expect to leave the hospital. Shared with family that the PMT will continue to support family and patient going forward and can help manage symptoms including insomnia, shortness of breath, nausea, vomiting, constipation and others. Patient expressed that he was not able to sleep last night and was okay with attempting trazodone  to aid with sleep. Patient also shared that he is having shortness of breath but not ready for opioids to help with his sensation of shortness of breath at this time. But they did discuss when they are ready they might be open to starting an opioid infusion per discussion with heart failure team.   Review of Systems  Constitutional:  Positive for activity change, appetite change and fatigue.  Respiratory:  Positive for shortness of breath.     Objective:   Primary Diagnoses: Present on Admission: **None**  Vital Signs:  BP 97/60 (BP Location: Right Wrist)   Pulse 71   Temp 97.7 F (36.5 C) (Oral)   Resp 20   Ht 5' 5 (1.651 m)   Wt 82.7 kg   SpO2 100%   BMI 30.34 kg/m   Physical Exam Constitutional:      Appearance: He is ill-appearing.  HENT:     Head: Normocephalic.      Nose: Nose normal.  Eyes:     Extraocular Movements: Extraocular movements intact.  Cardiovascular:     Rate and Rhythm: Normal rate.  Pulmonary:     Comments: Accessory muscle use.  Abdominal:     General: There is distension.  Neurological:     General: No focal deficit present.     Mental Status: He is alert.  Psychiatric:     Comments: Depressed.    Palliative Assessment/Data: 40%   Existing Vynca/ACP Documentation: None  Thank you for allowing us  to participate in the care of Jermaine Kramer PMT will continue to support holistically.  I personally spent a total of 35 minutes in the care of the patient today including preparing to see the patient, performing a medically appropriate exam/evaluation, counseling and educating, placing orders, referring and communicating with other health care professionals, and documenting clinical information in the EHR.  Laural Eiland E Trenae Brunke, PA-C  Palliative Medicine Team  Team Phone # [phone] (Nights/Weekends) 04/02/2024 4:20 PM

## 2024-04-02 NOTE — Progress Notes (Addendum)
 Advanced Heart Failure Rounding Note  HF Cardiologist: Dr. Rolan  Chief Complaint: HF Subjective:    Remains on DBA 5. Co-ox still low 48%.   Denies resting dyspnea but c/w cough w/ frothy sputum. Abdomen more distended today. C/w nausea and poor appetite. CVP 24   Remains on lasix  gtt @ 30/hr. I/Os not charted yesterday  BMP pending.   Objective:   Weight Range: 82.7 kg Body mass index is 30.34 kg/m.   Vital Signs:   Temp:  [97.4 F (36.3 C)-97.8 F (36.6 C)] 97.5 F (36.4 C) (12/03 0707) Pulse Rate:  [68-79] 70 (12/03 0707) Resp:  [18-20] 20 (12/03 0707) BP: (88-104)/(59-74) 90/62 (12/03 0707) SpO2:  [89 %-99 %] 89 % (12/03 0707) Weight:  [82.7 kg] 82.7 kg (12/03 0356) Last BM Date : 04/01/24  Weight change: Filed Weights   03/31/24 0426 04/01/24 0448 04/02/24 0356  Weight: 80 kg 81 kg 82.7 kg    Intake/Output:   Intake/Output Summary (Last 24 hours) at 04/02/2024 9287 Last data filed at 04/01/2024 1500 Gross per 24 hour  Intake 820.57 ml  Output --  Net 820.57 ml      Physical Exam   CVP 24  GENERAL: fatigued appearing, no respiratory difficulty  Lungs- diminished at the bases b/l, faint diffuse rhonchi  CARDIAC:  JVP elevated to jaw         Normal rate with regular rhythm. No MRG. Trace b/l pretibial edema  ABDOMEN: markedly distended, NT  EXTREMITIES: trace b/l LEE, cool distal ex   NEUROLOGIC: No obvious FND   Telemetry   NSR 70s, Personally reviewed  Labs    CBC Recent Labs    04/01/24 0757  WBC 5.7  HGB 10.4*  HCT 32.2*  MCV 75.2*  PLT 155    Basic Metabolic Panel Recent Labs    87/98/74 0500 04/01/24 0757  NA 124* 123*  K 3.5 4.2  CL 85* 84*  CO2 27 28  GLUCOSE 226* 234*  BUN 43* 53*  CREATININE 2.33* 2.98*  CALCIUM  8.2* 8.5*  MG 1.9  --    Liver Function Tests Recent Labs    03/30/24 1159  AST 20  ALT 11  ALKPHOS 65  BILITOT 1.2  PROT 6.5  ALBUMIN  3.5    No results for input(s): LIPASE,  AMYLASE in the last 72 hours. Cardiac Enzymes No results for input(s): CKTOTAL, CKMB, CKMBINDEX, TROPONINI in the last 72 hours.  BNP: BNP (last 3 results) Recent Labs    02/01/24 1118 02/09/24 0921 03/26/24 1455  BNP 941.8* 1,871.3* 2,129.6*    ProBNP (last 3 results) Recent Labs    03/19/24 1038 03/25/24 1249  PROBNP 19,467.0* 20,964.0*     D-Dimer No results for input(s): DDIMER in the last 72 hours. Hemoglobin A1C No results for input(s): HGBA1C in the last 72 hours. Fasting Lipid Panel No results for input(s): CHOL, HDL, LDLCALC, TRIG, CHOLHDL, LDLDIRECT in the last 72 hours. Thyroid Function Tests No results for input(s): TSH, T4TOTAL, T3FREE, THYROIDAB in the last 72 hours.  Invalid input(s): FREET3   Other results:   Imaging    No results found.    Medications:     Scheduled Medications:  apixaban   5 mg Oral BID   atorvastatin   80 mg Oral Daily   Chlorhexidine  Gluconate Cloth  6 each Topical Daily   insulin  aspart  0-9 Units Subcutaneous TID WC   pantoprazole   80 mg Oral Daily   sodium chloride  flush  10-40  mL Intracatheter Q12H   sodium chloride  flush  3 mL Intravenous Q12H   tamsulosin   0.4 mg Oral QPC supper    Infusions:  amiodarone  30 mg/hr (04/02/24 0157)   DOBUTamine 5 mcg/kg/min (04/02/24 0019)   furosemide  (LASIX ) 200 mg in dextrose  5 % 100 mL (2 mg/mL) infusion 30 mg/hr (04/02/24 0538)    PRN Medications: acetaminophen , albuterol , dextromethorphan -guaiFENesin , melatonin, methocarbamol , ondansetron  (ZOFRAN ) IV, sodium chloride  flush, sodium chloride  flush    Assessment/Plan   1. Acute on chronic biventricular heart failure: Ischemic cardiomyopathy.  Echo (5/23) with EF 30-35%, severe LV dilation, mild MR, low normal RV systolic function. He has a Medtronic ICD and a cardiac contractility modulator. Echo in 7/24 showed EF 20-25% with mild RV dysfunction.  Echo in 11/25 (done at [facility]  so cannot review) showed EF 25%, mild RV dysfunction, moderate MR.  He had a RHC in 10/25 with markedly low cardiac output and markedly high right and left heart filling pressures.  GDMT titration has been complicated by CKD stage 3b and soft BP.  He has RBBB-like IVCD, have discussed with EP and unlikely to benefit from CRT upgrade.  He was just discharged from a hospitalization for CHF exacerbation. - Repeat echo: EF < 20%, RV severely reduced, moderate BAE, mild to moderate MR - Continues w/ refractory low output/diuretic resistance despite high dose inotropic support. Co-ox 48% on 5 mcg of DBA. He did not see much benefit on the 7.5 mcg dose. C/w worsening renal failure and volume overload. CVP now >20  - GDMT limited by CKD   He has end-stage (Stage D HF). Not a VAD candidate given renal dysfunction and significant RV failure. He is not responding to inotropic support. Would strongly recommend pt reconsider hospice support. Will ask PC team to revisit w/ pt and family today.    2. CAD: s/p CABG x 3 in 10/22. - No s/s angina - Continue atorvastatin  80 mg daily.  - No ASA given stable CAD and apixaban  use.    3. CKD: stage IV.  Follows with nephrology.  Baseline creatinine 2.3   - Scr 2.7 -> 2.5 -> 2.3 -> 2.3 -> 2.2 -> 2.3->pending     4. Atrial fibrillation: Paroxysmal.  Device interrogation 11/24 shows occasional AF runs, generally short.   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 Will need to stop eliquis  and start bival prior to any invasive workup/procedures.   6. Hyponatremia: Limit fluid intake - In setting of massive volume overload - Received tolvaptan with mild benefit. Repeat BMP pending   7. Hypokalemia - f/u BMP pending      Length of Stay: [address], PA-C  04/02/2024, 7:12 AM  Advanced Heart Failure Team Pager [phone] (M-F; 7a - 5p)  Please contact [facility] for night-coverage after hours (5p -7a ) and weekends on [email or web address]  Agree with above.   Remains on DBA 5. Lasix  at 30. Much worse today. Co-ox low. Scr up to 3  CVP now 24  + cough with frothy sputum  General:  Sitting up in chair. + SOB HEENT: normal Neck: supple. JVP to ear Cor: Regular rate & rhythm. No rubs, gallops or murmurs. Lungs: clear Abdomen: soft, nontender, ++ distended.Good bowel sounds. Extremities: no cyanosis, clubbing, rash, 2+  edema cool Neuro: alert & orientedx3, cranial nerves grossly intact. moves all 4 extremities  w/o difficulty. Affect pleasant  He is actively dying from end-stage HF with progressive renal failure. Not candidate for mechanical support  I think it is unlikely that he will make it out of the hospital   I discussed this with him and his wife. He is now DNR/DNI and we will deactivate his ICD  Continue dobutamine for now. We discussed option of morphine  gtt if symptoms progress  CRITICAL CARE Performed by: Cherrie Sieving  Total critical care time: 35 minutes  Critical care time was exclusive of separately billable procedures and treating other patients.  Critical care was necessary to treat or prevent imminent or life-threatening deterioration.  Critical care was time spent personally by me (independent of midlevel providers or residents) on the following activities: development of treatment plan with patient and/or surrogate as well as nursing, discussions with consultants, evaluation of patient's response to treatment, examination of patient, obtaining history from patient or surrogate, ordering and performing treatments and interventions, ordering and review of laboratory studies, ordering and review of radiographic studies, pulse oximetry and re-evaluation of patient's condition.  Sieving Cherrie, MD  10:10 AM

## 2024-04-02 NOTE — Inpatient Diabetes Management (Signed)
 Inpatient Diabetes Program Recommendations  AACE/ADA: New Consensus Statement on Inpatient Glycemic Control (2015)  Target Ranges:  Prepandial:   less than 140 mg/dL      Peak postprandial:   less than 180 mg/dL (1-2 hours)      Critically ill patients:  140 - 180 mg/dL    Latest Reference Range & Units 04/01/24 06:30 04/01/24 10:48 04/01/24 16:25 04/01/24 20:44  Glucose-Capillary 70 - 99 mg/dL 818 (H)  2 units Novolog   215 (H)  3 units Novolog   201 (H)  3 units Novolog   155 (H)  (H): Data is abnormally high     Home DM Meds: Metformin  500 mg BID (NOT taking)  Current Orders: Novolog  Sensitive Correction Scale/ SSI (0-9 units) TID AC     MD- Please consider increasing the Novolog  SSI to the 0-15 unit Moderate scale   --Will follow patient during hospitalization--  Adina Rudolpho Arrow RN, MSN, CDCES Diabetes Coordinator Inpatient Glycemic Control Team Team Pager: [phone] (8a-5p)

## 2024-04-02 NOTE — Progress Notes (Signed)
 After GOC discussion between pt, pt's wife and Dr. Cherrie, decision made to switch code status to DNR/DNI and to deactivate ICD. Will notify [facility] device reps to turn off tachytherapies. Order placed. CODE status changed. RN notified.   Caffie Shed, PA-C

## 2024-04-03 LAB — GLUCOSE, CAPILLARY
Glucose-Capillary: 113 mg/dL — ABNORMAL HIGH (ref 70–99)
Glucose-Capillary: 158 mg/dL — ABNORMAL HIGH (ref 70–99)
Glucose-Capillary: 202 mg/dL — ABNORMAL HIGH (ref 70–99)
Glucose-Capillary: 176 mg/dL — ABNORMAL HIGH (ref 70–99)

## 2024-04-03 LAB — BASIC METABOLIC PANEL WITH GFR
Anion gap: 12 (ref 5–15)
BUN: 56 mg/dL — ABNORMAL HIGH (ref 8–23)
CO2: 25 mmol/L (ref 22–32)
Calcium: 7.7 mg/dL — ABNORMAL LOW (ref 8.9–10.3)
Chloride: 85 mmol/L — ABNORMAL LOW (ref 98–111)
Creatinine, Ser: 4.03 mg/dL — ABNORMAL HIGH (ref 0.61–1.24)
GFR, Estimated: 15 mL/min — ABNORMAL LOW (ref >60)
Glucose, Bld: 195 mg/dL — ABNORMAL HIGH (ref 70–99)
Potassium: 3.5 mmol/L (ref 3.5–5.1)
Sodium: 122 mmol/L — ABNORMAL LOW (ref 135–145)

## 2024-04-03 LAB — COOXEMETRY PANEL
Carboxyhemoglobin: 1.1 % (ref 0.5–1.5)
Methemoglobin: <0.7 % (ref 0.0–1.5)
O2 Saturation: 61.2 %
Total hemoglobin: 10.5 g/dL — ABNORMAL LOW (ref 12.0–16.0)

## 2024-04-03 MED ORDER — LORAZEPAM 0.5 MG PO TABS
0.5000 mg | ORAL_TABLET | ORAL | Status: DC | PRN
  Administered 2024-04-03 – 2024-04-04 (×2): 1 mg via ORAL
  Filled 2024-04-03 (×2): qty 2

## 2024-04-03 MED ORDER — POTASSIUM CHLORIDE CRYS ER 20 MEQ PO TBCR
20.0000 meq | EXTENDED_RELEASE_TABLET | Freq: Once | ORAL | Status: AC
  Administered 2024-04-03: 20 meq via ORAL
  Filled 2024-04-03: qty 1

## 2024-04-03 MED ORDER — HYDROMORPHONE HCL 1 MG/ML IJ SOLN
0.2500 mg | INTRAMUSCULAR | Status: DC | PRN
  Administered 2024-04-04: 0.5 mg via INTRAVENOUS
  Filled 2024-04-03: qty 1

## 2024-04-03 NOTE — Progress Notes (Deleted)
 Entered in error

## 2024-04-03 NOTE — Progress Notes (Signed)
 Daily Progress Note   Date: 04/03/2024   Patient Name: Jermaine Kramer  DOB: Jan 11, 1951  MRN: [id number]  Age / Sex: 73 y.o., male  Attending Physician: Cherrie Toribio SAUNDERS, MD Primary Care Physician: Perri Constance Sor, PA-C Admit Date: 03/26/2024 Length of Stay: 8 days  Reason for Follow-up: Establishing goals of care  Past Medical History:  Diagnosis Date   Arrhythmia    atrial fibrillation   CHF (congestive heart failure) (HCC)    Coronary artery disease    Diabetes mellitus without complication (HCC)    GERD (gastroesophageal reflux disease)    Hypercholesteremia    Hypertension    Sleep apnea    Stroke [facility])    2015 or longer ago    Assessment & Plan:   HPI/Patient Profile:  73 y.o. male  with past medical history of CAD s/p CABG x3 (2022), atrial fibrillation, CKD, HFrEF (<20%) admitted on 03/26/2024 with acute on chronic biventricular heart failure with low-output.    Per H&P on 11/26 by Bensimhon MD patient presented to heart failure clinic on 11/24 after discharge from [facility] (11/19-11/24) with volume overload. Patient had a recent right heart cath that is concerning for end stage heart failure with consideration for LVAD. Patient has had about 4 admissions in the last 2 months for congestive heart failure.   Per progress note on 12/01 by Bensimhon MD after days of diuresing as well as inotrope adjustment from milrinone  to dobutamine  without much improvement in patient's fatigue and shortness of breath along with patient's elevated creatinine remains >2 precludes patient from further advanced therapies such as LVAD. Plan to continue to titrate dobutamine  but most likely path for patient currently is palliative dobutamine  vs hospice.    Palliative medicine consulted for goals of care conversation.   SUMMARY OF RECOMMENDATIONS DNR-comfort, continue current dobutamine , lasix , and amiodarone  infusions Symptom management for shortness of breath, anxiety and insomnia as  below Unrestricted visitation  Symptom Management:  Ativan  for anxiety, agitation, shortness of breath Atarax  for anxiety Dilaudid  for shortness of breath Trazodone  for insomnia  Code Status: DNR - Comfort  Prognosis: Hours - Days  Discharge Planning: Anticipated Hospital Death  Discussed with: Bensimhon MD and Marcine PA of heart failure team about additional symptom management and DNR-comfort status while continuing current infusions.   Subjective:   Subjective: Chart Reviewed. Updates received. Patient Assessed. Created space and opportunity for patient  and family to explore thoughts and feelings regarding current medical situation.  Today's Discussion:  Met with patient and wife bedside along with many other family members. Patient remains emotional when discussing end of life. Reported that he continues to have shortness of breath and that the ativan  has been helping slightly but the effect has been short. Discussed adding low dose dilaudid  as needed for shortness of breath. Discussed increasing ativan  as needed up to 1 mg. Family is also requesting that visitation limits be adjusted as the patient has many family and friends that are coming to visit and they have been limited by the front desk staff. Discussed that I will add unrestricted visitation as well.   Patient expressed that he is not quite ready to begin the morphine  infusion as discussed with heart failure team and wants to continue to have some more time for family and friends to visit. He also wants to be able to be awake while they visit.   Review of Systems  Constitutional:  Positive for activity change, appetite change and fatigue.  Respiratory:  Positive  for shortness of breath.     Objective:   Primary Diagnoses: Present on Admission: **None**   Vital Signs:  BP (!) 104/59 (BP Location: Left Arm)   Pulse 71   Temp 97.7 F (36.5 C) (Oral)   Resp 18   Ht 5' 5 (1.651 m)   Wt 83.5 kg   SpO2 98%    BMI 30.63 kg/m   Physical Exam Constitutional:      Appearance: He is ill-appearing.  HENT:     Head: Normocephalic.     Nose: Nose normal.     Mouth/Throat:     Mouth: Mucous membranes are dry.  Eyes:     Extraocular Movements: Extraocular movements intact.  Cardiovascular:     Rate and Rhythm: Normal rate.     Pulses: Normal pulses.  Abdominal:     General: There is distension.  Skin:    General: Skin is warm.  Neurological:     General: No focal deficit present.     Mental Status: He is alert.  Psychiatric:     Comments: Depressed.    Palliative Assessment/Data: 30%   Existing Vynca/ACP Documentation: None  Thank you for allowing us  to participate in the care of CHIDERA THIVIERGE PMT will continue to support holistically.  I personally spent a total of 50 minutes in the care of the patient today including preparing to see the patient, performing a medically appropriate exam/evaluation, counseling and educating, placing orders, referring and communicating with other health care professionals, and documenting clinical information in the EHR.  Fairy FORBES Shan DEVONNA  Palliative Medicine Team  Team Phone # [phone] (Nights/Weekends) 04/03/2024 9:27 AM

## 2024-04-03 NOTE — Progress Notes (Addendum)
 Advanced Heart Failure Rounding Note  HF Cardiologist: Dr. Rolan  Chief Complaint: HF Subjective:    Remains on DBA 5. Co-ox 61%. CVP line not working.   C/w progressive renal failure, SCr now > 4.0. Oliguric. K 3.5. Corrected Na 124   OOB, sitting up in chair. Denies resting dyspnea but still on 3L [place]. Able to tolerate breakfast this morning w/ less nausea than day before. Wife present at bedside.    Objective:   Weight Range: 83.5 kg Body mass index is 30.63 kg/m.   Vital Signs:   Temp:  [97.7 F (36.5 C)-98.5 F (36.9 C)] 97.8 F (36.6 C) (12/04 0331) Pulse Rate:  [64-73] 72 (12/04 0400) Resp:  [18-20] 20 (12/04 0331) BP: (81-102)/(56-77) 90/77 (12/04 0331) SpO2:  [95 %-100 %] 95 % (12/04 0400) Weight:  [83.5 kg] 83.5 kg (12/04 0329) Last BM Date : 04/02/24  Weight change: Filed Weights   04/01/24 0448 04/02/24 0356 04/03/24 0329  Weight: 81 kg 82.7 kg 83.5 kg    Intake/Output:   Intake/Output Summary (Last 24 hours) at 04/03/2024 0715 Last data filed at 04/03/2024 0529 Gross per 24 hour  Intake 1552.76 ml  Output 130 ml  Net 1422.76 ml      Physical Exam   GENERAL: fatigued appearing, sitting up in chair. NAD Lungs- diminished at bases w/ b/l expiratory rhonchi  CARDIAC:  JVP elevated to jaw         Normal rate with regular rhythm. 2/6 MR murmur   ABDOMEN: distended, non-tender  EXTREMITIES: Warm and well perfused. + RUE PICC NEUROLOGIC: No obvious FND  Telemetry   NSR 70s, Personally reviewed  Labs    CBC Recent Labs    04/01/24 0757  WBC 5.7  HGB 10.4*  HCT 32.2*  MCV 75.2*  PLT 155    Basic Metabolic Panel Recent Labs    87/96/74 1606 04/03/24 0559  NA 120* 122*  K 4.1 3.5  CL 81* 85*  CO2 27 25  GLUCOSE 225* 195*  BUN 56* 56*  CREATININE 3.74* 4.03*  CALCIUM  8.5* 7.7*   Liver Function Tests No results for input(s): AST, ALT, ALKPHOS, BILITOT, PROT, ALBUMIN  in the last 72 hours.   No results for  input(s): LIPASE, AMYLASE in the last 72 hours. Cardiac Enzymes No results for input(s): CKTOTAL, CKMB, CKMBINDEX, TROPONINI in the last 72 hours.  BNP: BNP (last 3 results) Recent Labs    02/01/24 1118 02/09/24 0921 03/26/24 1455  BNP 941.8* 1,871.3* 2,129.6*    ProBNP (last 3 results) Recent Labs    03/19/24 1038 03/25/24 1249  PROBNP 19,467.0* 20,964.0*     D-Dimer No results for input(s): DDIMER in the last 72 hours. Hemoglobin A1C No results for input(s): HGBA1C in the last 72 hours. Fasting Lipid Panel No results for input(s): CHOL, HDL, LDLCALC, TRIG, CHOLHDL, LDLDIRECT in the last 72 hours. Thyroid Function Tests No results for input(s): TSH, T4TOTAL, T3FREE, THYROIDAB in the last 72 hours.  Invalid input(s): FREET3   Other results:   Imaging    No results found.    Medications:     Scheduled Medications:  apixaban   5 mg Oral BID   atorvastatin   80 mg Oral Daily   Chlorhexidine  Gluconate Cloth  6 each Topical Daily   insulin  aspart  0-9 Units Subcutaneous TID WC   pantoprazole   80 mg Oral Daily   sodium chloride  flush  10-40 mL Intracatheter Q12H   sodium chloride  flush  3 mL  Intravenous Q12H   tamsulosin   0.4 mg Oral QPC supper    Infusions:  amiodarone  30 mg/hr (04/03/24 0529)   DOBUTamine 5 mcg/kg/min (04/03/24 0529)   furosemide  (LASIX ) 200 mg in dextrose  5 % 100 mL (2 mg/mL) infusion 30 mg/hr (04/03/24 0529)    PRN Medications: acetaminophen , albuterol , dextromethorphan -guaiFENesin , hydrOXYzine, LORazepam, methocarbamol , ondansetron  (ZOFRAN ) IV, sodium chloride  flush, sodium chloride  flush, traZODone     Assessment/Plan   1. Acute on chronic biventricular heart failure: Ischemic cardiomyopathy.  Echo (5/23) with EF 30-35%, severe LV dilation, mild MR, low normal RV systolic function. He has a Medtronic ICD and a cardiac contractility modulator. Echo in 7/24 showed EF 20-25% with mild RV  dysfunction.  Echo in 11/25 (done at [facility] so cannot review) showed EF 25%, mild RV dysfunction, moderate MR.  He had a RHC in 10/25 with markedly low cardiac output and markedly high right and left heart filling pressures.  GDMT titration has been complicated by CKD stage 3b and soft BP.  He has RBBB-like IVCD, have discussed with EP and unlikely to benefit from CRT upgrade.  He was just discharged from a hospitalization for CHF exacerbation. - Repeat echo: EF < 20%, RV severely reduced, moderate BAE, mild to moderate MR - Continues w/ refractory low output/diuretic resistance/dual organ failure despite high dose inotropic support. Remains on DBA 5 mcg. He did not see much benefit on the 7.5 mcg dose. C/w worsening renal failure, SCr now > 4.0 and oliguric.  - GDMT limited by CKD   He has end-stage (Stage D HF). Not a VAD candidate given renal dysfunction and significant RV failure. He is not responding to inotropic support. Pt now DNR. ICD has been deactivated. He is more receptive to palliative care conversations. We discussed option of morphine  gtt if symptoms progress   2. CAD: s/p CABG x 3 in 10/22. - No s/s angina - Continue atorvastatin  80 mg daily.  - No ASA given stable CAD and apixaban  use.    3. CKD: stage IV.  Follows with nephrology.  Baseline creatinine 2.3   - cardiorenal  - Scr 2.7 -> 2.5 -> 2.3 -> 2.3 -> 2.2 -> 2.3->2.9>>4.0 - now oliguric    4. Atrial fibrillation: Paroxysmal.  Device interrogation 11/24 shows occasional AF runs, generally short.   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 Will need to stop eliquis  and start bival prior to any invasive workup/procedures.   6. Hyponatremia:  - In setting of massive volume overload - Received tolvaptan with mild benefit. Corrected Na 124 today   7. Hypokalemia - K 3.5 today      Length of Stay: [address] PA-C  04/03/2024, 7:15 AM  Advanced Heart Failure Team Pager [phone] (M-F; 7a - 5p)  Please contact [facility] for night-coverage after hours (5p -7a ) and weekends on [email or web address]   Patient seen and examined with the above-signed Advanced Practice Provider and/or Housestaff. I personally reviewed laboratory data, imaging studies and relevant notes. I independently examined the patient and formulated the important aspects of the plan. I have edited the note to reflect any of my changes or salient points. I have personally discussed the plan with the patient and/or family.  Remains on DBA. Co-ox 61%. Scr worsening. Anuric. Feels nauseated. SNa 122  General:  Sitting up in bed No resp difficulty HEENT: normal Neck:  supple. JVP to ear Cor: Regular rate & rhythm. No rubs, gallops or murmurs. Lungs: clear Abdomen: soft, nontender, nondistended.Good bowel sounds. Extremities: no cyanosis, clubbing, rash, 2+ edema Neuro: alert & orientedx3, cranial nerves grossly intact. moves all 4 extremities w/o difficulty. Affect pleasant  He is actively dying from end-stage HF. No response to inotropic support. Now with fulminant renal failure.   Will continue current support for now but expect transition to comfort care in next 24-48 hours as his renal failure/uremia progresses.  Toribio Fuel, MD  8:12 AM

## 2024-04-03 NOTE — Progress Notes (Incomplete)
 73 y.o. M with history of CAD, paroxysmal atrial fibrillation, CKD, and ischemic cardiomyopathy/chronic HFrEF.    Patient had CABG x 3 in 10/22. LV function has remained low since that time, ischemic CMP.  Echo in 5/23 showed EF 30-35%, severe LV dilation, mild Jermaine, low normal RV systolic function. He has a Medtronic ICD. He has paroxysmal atrial fibrillation but is generally in NSR. He has struggled with volume overload. This is complicated by CKD stage 3b. He follows with nephrology, Dr. Marcelino.   Echo in 7/24 showed EF 20-25% with mild RV dysfunction.  He had cardiac contractility modulator placed in 5/24 by Dr. Cindie.  Echo in 11/25 (done at [facility]) showed EF 25%, mild RV dysfunction, moderate Jermaine.    Jermaine Kramer has done poorly recently.  He had RHC done through [facility] clinic in 10/25 showing mean RA 22, PA 54/29, mean PCWP 27, and CI 1.39.  He went to the ER at [facility] in 11/25 due to peripheral edema/volume overload with NYHA class IIIb-IV symptoms.  He was admitted from 11/19-24, received IV Lasix  and placed on midodrine  for low blood pressure. Creatinine was 2.12 on the day of discharge.     He was seen in our [facility] 03/24/24 (day after discharge). He was markedly volume overloaded w/ NYHA IIIb-IV symptoms. He was direct admitted to [facility] for IV diuresis and inotropic support. Given markedly low cardiac output on recent RHC there was concern that he was nearing end-stage and may require advanced therapies.  He was initially placed on milrinone  + lasix  gtt. Milrinone  later stopped and he was switched to DBA d/t hypotension. Repeat echo: EF < 20%, RV severely reduced, moderate BAE, mild to moderate Jermaine. Unfortunately, he did not respond to inotropic support. DBA was titrated up to 7.5 mcg/kg/min but c/w refractory low output w/ Co-ox persistently in the 40s and refractory volume overload w/ worsening, anuric, renal failure. Scr gradually rose to > 4.0. Given dual organ failure + significant RV  dysfunction, no longer a candidate for LVAD.  After GOC discussion, pt opted to transition to comfort care. He was made DNR/DNI. His ICD was deactivated.

## 2024-04-03 NOTE — Plan of Care (Signed)
  Problem: Education: Goal: Knowledge of General Education information will improve Description: Including pain rating scale, medication(s)/side effects and non-pharmacologic comfort measures Outcome: Progressing   Problem: Health Behavior/Discharge Planning: Goal: Ability to manage health-related needs will improve Outcome: Progressing   Problem: Clinical Measurements: Goal: Ability to maintain clinical measurements within normal limits will improve Outcome: Progressing Goal: Will remain free from infection Outcome: Progressing Goal: Diagnostic test results will improve Outcome: Progressing Goal: Respiratory complications will improve Outcome: Progressing Goal: Cardiovascular complication will be avoided Outcome: Progressing   Problem: Nutrition: Goal: Adequate nutrition will be maintained Outcome: Progressing   Problem: Coping: Goal: Level of anxiety will decrease Outcome: Progressing   Problem: Pain Managment: Goal: General experience of comfort will improve and/or be controlled Outcome: Progressing   Problem: Safety: Goal: Ability to remain free from injury will improve Outcome: Progressing   Problem: Skin Integrity: Goal: Risk for impaired skin integrity will decrease Outcome: Progressing   Problem: Education: Goal: Ability to describe self-care measures that may prevent or decrease complications (Diabetes Survival Skills Education) will improve Outcome: Progressing Goal: Individualized Educational Video(s) Outcome: Progressing   Problem: Coping: Goal: Ability to adjust to condition or change in health will improve Outcome: Progressing   Problem: Health Behavior/Discharge Planning: Goal: Ability to identify and utilize available resources and services will improve Outcome: Progressing Goal: Ability to manage health-related needs will improve Outcome: Progressing   Problem: Metabolic: Goal: Ability to maintain appropriate glucose levels will  improve Outcome: Progressing   Problem: Nutritional: Goal: Maintenance of adequate nutrition will improve Outcome: Progressing   Problem: Skin Integrity: Goal: Risk for impaired skin integrity will decrease Outcome: Progressing   Problem: Tissue Perfusion: Goal: Adequacy of tissue perfusion will improve Outcome: Progressing

## 2024-04-04 LAB — BASIC METABOLIC PANEL WITH GFR
Anion gap: 16 — ABNORMAL HIGH (ref 5–15)
BUN: 66 mg/dL — ABNORMAL HIGH (ref 8–23)
CO2: 27 mmol/L (ref 22–32)
Calcium: 8.4 mg/dL — ABNORMAL LOW (ref 8.9–10.3)
Chloride: 79 mmol/L — ABNORMAL LOW (ref 98–111)
Creatinine, Ser: 4.96 mg/dL — ABNORMAL HIGH (ref 0.61–1.24)
GFR, Estimated: 11 mL/min — ABNORMAL LOW (ref >60)
Glucose, Bld: 189 mg/dL — ABNORMAL HIGH (ref 70–99)
Potassium: 4.2 mmol/L (ref 3.5–5.1)
Sodium: 122 mmol/L — ABNORMAL LOW (ref 135–145)

## 2024-04-04 LAB — COOXEMETRY PANEL
Carboxyhemoglobin: 1.7 % — ABNORMAL HIGH (ref 0.5–1.5)
Methemoglobin: 1.6 % — ABNORMAL HIGH (ref 0.0–1.5)
O2 Saturation: 57.1 %
Total hemoglobin: 10.5 g/dL — ABNORMAL LOW (ref 12.0–16.0)

## 2024-04-04 LAB — GLUCOSE, CAPILLARY: Glucose-Capillary: 143 mg/dL — ABNORMAL HIGH (ref 70–99)

## 2024-04-04 MED ORDER — ACETAMINOPHEN 325 MG PO TABS: 650.0000 mg | ORAL_TABLET | Freq: Four times a day (QID) | ORAL | Status: DC | PRN

## 2024-04-04 MED ORDER — GLYCOPYRROLATE 0.2 MG/ML IJ SOLN: 0.2000 mg | INTRAMUSCULAR | Status: DC | PRN

## 2024-04-04 MED ORDER — LORAZEPAM 2 MG/ML PO CONC: 1.0000 mg | ORAL | Status: DC | PRN

## 2024-04-04 MED ORDER — HYDROMORPHONE HCL 1 MG/ML IJ SOLN
0.5000 mg | INTRAMUSCULAR | Status: DC | PRN
  Administered 2024-04-04: 1 mg via INTRAVENOUS
  Filled 2024-04-04 (×4): qty 1

## 2024-04-04 MED ORDER — LORAZEPAM 1 MG PO TABS: 1.0000 mg | ORAL_TABLET | ORAL | Status: DC | PRN

## 2024-04-04 MED ORDER — ONDANSETRON HCL 4 MG/2ML IJ SOLN: 4.0000 mg | Freq: Four times a day (QID) | INTRAMUSCULAR | Status: DC | PRN

## 2024-04-04 MED ORDER — POLYVINYL ALCOHOL 1.4 % OP SOLN: 1.0000 [drp] | Freq: Four times a day (QID) | OPHTHALMIC | Status: DC | PRN

## 2024-04-04 MED ORDER — LORAZEPAM 2 MG/ML IJ SOLN
1.0000 mg | INTRAMUSCULAR | Status: DC | PRN
  Administered 2024-04-05: 1 mg via INTRAVENOUS
  Filled 2024-04-04: qty 1

## 2024-04-04 MED ORDER — HYDROMORPHONE BOLUS VIA INFUSION
0.5000 mg | INTRAVENOUS | Status: DC | PRN
  Administered 2024-04-05 (×3): 2 mg via INTRAVENOUS
  Administered 2024-04-05 (×2): 1.5 mg via INTRAVENOUS
  Administered 2024-04-05 (×2): 2 mg via INTRAVENOUS
  Administered 2024-04-05: 1.5 mg via INTRAVENOUS
  Administered 2024-04-05 (×3): 2 mg via INTRAVENOUS

## 2024-04-04 MED ORDER — LORAZEPAM 0.5 MG PO TABS: 0.5000 mg | ORAL_TABLET | ORAL | Status: DC | PRN

## 2024-04-04 MED ORDER — ACETAMINOPHEN 650 MG RE SUPP: 650.0000 mg | Freq: Four times a day (QID) | RECTAL | Status: DC | PRN

## 2024-04-04 MED ORDER — HYDROMORPHONE HCL-NACL 50-0.9 MG/50ML-% IV SOLN
2.0000 mg/h | INTRAVENOUS | Status: DC
  Filled 2024-04-04: qty 50

## 2024-04-04 MED ORDER — HYDROMORPHONE HCL 1 MG/ML IJ SOLN
1.0000 mg | Freq: Once | INTRAMUSCULAR | Status: AC
  Administered 2024-04-04: 1 mg via INTRAVENOUS
  Filled 2024-04-04: qty 1

## 2024-04-04 MED ORDER — GLYCOPYRROLATE 1 MG PO TABS: 1.0000 mg | ORAL_TABLET | ORAL | Status: DC | PRN

## 2024-04-04 MED ORDER — BISACODYL 10 MG RE SUPP: 10.0000 mg | Freq: Every day | RECTAL | Status: DC | PRN

## 2024-04-04 MED ORDER — GLYCOPYRROLATE 0.2 MG/ML IJ SOLN
0.2000 mg | INTRAMUSCULAR | Status: DC | PRN
  Administered 2024-04-04 – 2024-04-05 (×4): 0.2 mg via INTRAVENOUS
  Filled 2024-04-04 (×4): qty 1

## 2024-04-04 MED ORDER — ONDANSETRON 4 MG PO TBDP: 4.0000 mg | ORAL_TABLET | Freq: Four times a day (QID) | ORAL | Status: DC | PRN

## 2024-04-04 MED ORDER — TRAZODONE HCL 50 MG PO TABS: 25.0000 mg | ORAL_TABLET | Freq: Every evening | ORAL | Status: DC | PRN

## 2024-04-04 MED ORDER — HYDROMORPHONE HCL 1 MG/ML IJ SOLN
1.0000 mg | Freq: Once | INTRAMUSCULAR | Status: AC
  Administered 2024-04-04: 1 mg via INTRAVENOUS

## 2024-04-04 MED ORDER — BIOTENE DRY MOUTH MT LIQD: 15.0000 mL | OROMUCOSAL | Status: DC | PRN

## 2024-04-04 MED ORDER — ALBUTEROL SULFATE (2.5 MG/3ML) 0.083% IN NEBU: 2.5000 mg | INHALATION_SOLUTION | RESPIRATORY_TRACT | Status: DC | PRN

## 2024-04-04 MED ORDER — SENNA 8.6 MG PO TABS: 1.0000 | ORAL_TABLET | Freq: Every evening | ORAL | Status: DC | PRN

## 2024-04-04 MED ORDER — HYDROMORPHONE HCL-NACL 50-0.9 MG/50ML-% IV SOLN
3.0000 mg/h | INTRAVENOUS | Status: DC
  Administered 2024-04-04 – 2024-04-05 (×2): 1 mg/h via INTRAVENOUS
  Administered 2024-04-05 (×2): 5 mg/h via INTRAVENOUS
  Filled 2024-04-04 (×3): qty 50

## 2024-04-04 NOTE — Progress Notes (Signed)
 Daily Progress Note   Date: 04/04/2024   Patient Name: Jermaine Kramer  DOB: Oct 18, 1950  MRN: [id number]  Age / Sex: 73 y.o., male  Attending Physician: Cherrie Toribio SAUNDERS, MD Primary Care Physician: Perri Constance Sor, PA-C Admit Date: 03/26/2024 Length of Stay: 9 days  Reason for Follow-up: Establishing goals of care, Non pain symptom management, and Terminal Care  Past Medical History:  Diagnosis Date   Arrhythmia    atrial fibrillation   CHF (congestive heart failure) (HCC)    Coronary artery disease    Diabetes mellitus without complication (HCC)    GERD (gastroesophageal reflux disease)    Hypercholesteremia    Hypertension    Sleep apnea    Stroke [facility])    2015 or longer ago    Assessment & Plan:   HPI/Patient Profile:  73 y.o. male  with past medical history of CAD s/p CABG x3 (2022), atrial fibrillation, CKD, HFrEF (<20%) admitted on 03/26/2024 with acute on chronic biventricular heart failure with low-output.    Per H&P on 11/26 by Bensimhon MD patient presented to heart failure clinic on 11/24 after discharge from [facility] (11/19-11/24) with volume overload. Patient had a recent right heart cath that is concerning for end stage heart failure with consideration for LVAD. Patient has had about 4 admissions in the last 2 months for congestive heart failure.   Per progress note on 12/01 by Bensimhon MD after days of diuresing as well as inotrope adjustment from milrinone  to dobutamine  without much improvement in patient's fatigue and shortness of breath along with patient's elevated creatinine remains >2 precludes patient from further advanced therapies such as LVAD. Plan to continue to titrate dobutamine  but most likely path for patient currently is palliative dobutamine  vs hospice.    Palliative medicine consulted for goals of care conversation.   SUMMARY OF RECOMMENDATIONS DNR-comfort Dilaudid  infusion for comfort/shortness of breath Discontinue dobutamine , lasix , and  amiodarone  infusion when patient comfortable on dilaudid  infusion Symptom management as below  Symptom Management:  Dilaudid  pain/dyspnea/increased work of breathing/RR>25 Tylenol  PRN pain/fever Biotin PRN daily Benadryl PRN itching Robinul  PRN secretions Haldol PRN agitation/delirium Ativan  PRN anxiety/seizure/sleep/distress Zofran  PRN nausea/vomiting Liquifilm Tears PRN dry eye  Code Status: DNR - Comfort  Prognosis: Hours - Days  Discharge Planning: Anticipated Hospital Death  Discussed with: Hayes NP and Bensimhon MD 04/04/2024 about patient's symptom management as well as decision to transition to full comfort measures and beginning dilaudid  infusion.   Subjective:   Subjective: Chart Reviewed. Updates received. Patient Assessed. Created space and opportunity for patient  and family to explore thoughts and feelings regarding current medical situation.  Patient received trazodone  50 mg PRN x1 for sleep, Ativan  1 mg PRN, dilaudid  3.5 mg without much relief. Patient was uncomfortable overnight with trazodone  and removed 2 PIVs.   Today's Discussion:  Met with patient initially in the morning after initial shortness of breath episode that improved after dilaudid  0.5 mg and 1 mg administration along with non-rebreather. Patient and family were not ready to initiate dilaudid  infusion at that time. Patient and family shared that he was uncomfortable overnight and this morning but okay with decreasing trazodone .   Afternoon visit after the patient's second episode of shortness of breath that was refractory after administering atarax  10 mg, ativan  1 mg, dilaudid  2 mg, and albuterol  nebulizer. Family and patient were in distress reporting that they cannot handle being like this anymore with his discomfort and episodes of shortness of breath. Inquired if family were ready  to begin dilaudid  infusion as or if they would like to continue with PRN dilaudid  injections, family and patient were  ready to begin infusion to manage his shortness of breath and discomfort. Family seemed hesitant to begin with high dosage and were okay with initiating at a low dose with slow titration as needed for the patient's comfort. Family and patient were initially concerned about loss of consciousness and they were reassured that we will only give what is needed but we may reach a certain point of increased somnolence to make sure that the patient is comfortable knowing that his time may be short. Also discussed with patient and family about discontinuing dobutamine , lasix , and amiodarone  infusion when the patient is comfortable on the dilaudid  infusion and they were agreeable to the plan.   Review of Systems  Constitutional:  Positive for fatigue.  Respiratory:  Positive for shortness of breath.   Gastrointestinal:  Positive for abdominal distention.    Objective:   Primary Diagnoses: Present on Admission: **None**   Vital Signs:  BP (!) 86/59 (BP Location: Left Arm)   Pulse 71   Temp 98 F (36.7 C) (Oral)   Resp 16   Ht 5' 5 (1.651 m)   Wt 85 kg   SpO2 100%   BMI 31.18 kg/m   Physical Exam Constitutional:      Appearance: He is ill-appearing.  HENT:     Head: Normocephalic.     Nose: Nose normal.     Mouth/Throat:     Mouth: Mucous membranes are dry.  Eyes:     Extraocular Movements: Extraocular movements intact.  Cardiovascular:     Rate and Rhythm: Tachycardia present.     Pulses: Normal pulses.  Pulmonary:     Effort: Respiratory distress present.  Abdominal:     General: There is distension.  Skin:    General: Skin is warm.  Neurological:     General: No focal deficit present.     Mental Status: He is alert.  Psychiatric:     Comments: Anxious    Palliative Assessment/Data: 20%   Existing Vynca/ACP Documentation: None  Thank you for allowing us  to participate in the care of Jermaine Kramer PMT will continue to support holistically.  I personally spent a  total of 50 minutes in the care of the patient today including preparing to see the patient, performing a medically appropriate exam/evaluation, counseling and educating, placing orders, referring and communicating with other health care professionals, and documenting clinical information in the EHR.  Fairy FORBES Shan DEVONNA  Palliative Medicine Team  Team Phone # [phone] (Nights/Weekends) 04/04/2024 7:53 AM

## 2024-04-04 NOTE — Progress Notes (Addendum)
 Advanced Heart Failure Rounding Note  HF Cardiologist: Dr. Rolan  Chief Complaint: HF Subjective:    Remains on DBA 5. Co-ox 57%. CVP line not working and unhooked.   C/w progressive renal failure, SCr now > 4.0. Oliguric. K 4.2. Corrected Na 122   Wife at bedside. Sitting up in chair. Reports restless night, he pulled out 2 IVs. Wife asking for something milder at bedtime. Patient denies CP/SOB.   Objective:   Weight Range: 85 kg Body mass index is 31.18 kg/m.   Vital Signs:   Temp:  [97.4 F (36.3 C)-98 F (36.7 C)] 98 F (36.7 C) (12/05 0000) Pulse Rate:  [70-71] 71 (12/05 0000) Resp:  [16-18] 16 (12/05 0000) BP: (86-91)/(59-69) 86/59 (12/05 0000) SpO2:  [96 %-100 %] 100 % (12/05 0000) Weight:  [85 kg] 85 kg (12/05 0432) Last BM Date : 04/03/24  Weight change: Filed Weights   04/02/24 0356 04/03/24 0329 04/04/24 0432  Weight: 82.7 kg 83.5 kg 85 kg   Intake/Output:   Intake/Output Summary (Last 24 hours) at 04/04/2024 0751 Last data filed at 04/03/2024 2125 Gross per 24 hour  Intake 483 ml  Output 450 ml  Net 33 ml    Physical Exam   General:  elderly/chronically ill appearing. Sitting up in chair.  No respiratory difficulty Neck: JVD ~8 cm.  Cor: Regular rate & rhythm. 2/6 MR. Lungs: clear, diminished bases Extremities: no edema  Neuro: Delayed responses. Follows commands.   Telemetry   Paced 70s (Personally reviewed)    Labs  CBC Recent Labs    04/01/24 0757  WBC 5.7  HGB 10.4*  HCT 32.2*  MCV 75.2*  PLT 155   Basic Metabolic Panel Recent Labs    87/95/74 0559 04/04/24 0425  NA 122* 122*  K 3.5 4.2  CL 85* 79*  CO2 25 27  GLUCOSE 195* 189*  BUN 56* 66*  CREATININE 4.03* 4.96*  CALCIUM  7.7* 8.4*   Liver Function Tests No results for input(s): AST, ALT, ALKPHOS, BILITOT, PROT, ALBUMIN  in the last 72 hours.  No results for input(s): LIPASE, AMYLASE in the last 72 hours. Cardiac Enzymes No results for  input(s): CKTOTAL, CKMB, CKMBINDEX, TROPONINI in the last 72 hours.  BNP: BNP (last 3 results) Recent Labs    02/01/24 1118 02/09/24 0921 03/26/24 1455  BNP 941.8* 1,871.3* 2,129.6*   ProBNP (last 3 results) Recent Labs    03/19/24 1038 03/25/24 1249  PROBNP 19,467.0* 20,964.0*   D-Dimer No results for input(s): DDIMER in the last 72 hours. Hemoglobin A1C No results for input(s): HGBA1C in the last 72 hours. Fasting Lipid Panel No results for input(s): CHOL, HDL, LDLCALC, TRIG, CHOLHDL, LDLDIRECT in the last 72 hours. Thyroid Function Tests No results for input(s): TSH, T4TOTAL, T3FREE, THYROIDAB in the last 72 hours.  Invalid input(s): FREET3  Other results:  Imaging   No results found.   Medications:   Scheduled Medications:  apixaban   5 mg Oral BID   atorvastatin   80 mg Oral Daily   Chlorhexidine  Gluconate Cloth  6 each Topical Daily   insulin  aspart  0-9 Units Subcutaneous TID WC   pantoprazole   80 mg Oral Daily   sodium chloride  flush  10-40 mL Intracatheter Q12H   sodium chloride  flush  3 mL Intravenous Q12H   tamsulosin   0.4 mg Oral QPC supper    Infusions:  amiodarone  30 mg/hr (04/04/24 0416)   DOBUTamine  5 mcg/kg/min (04/03/24 2035)   furosemide  (LASIX ) 200 mg in  dextrose  5 % 100 mL (2 mg/mL) infusion 30 mg/hr (04/04/24 0746)    PRN Medications: acetaminophen , albuterol , dextromethorphan -guaiFENesin , HYDROmorphone  (DILAUDID ) injection, hydrOXYzine , LORazepam , methocarbamol , ondansetron  (ZOFRAN ) IV, sodium chloride  flush, sodium chloride  flush, traZODone   Assessment/Plan   1. Acute on chronic biventricular heart failure: Ischemic cardiomyopathy.  Echo (5/23) with EF 30-35%, severe LV dilation, mild MR, low normal RV systolic function. He has a Medtronic ICD and a cardiac contractility modulator. Echo in 7/24 showed EF 20-25% with mild RV dysfunction.  Echo in 11/25 (done at [facility] so cannot review) showed EF  25%, mild RV dysfunction, moderate MR.  He had a RHC in 10/25 with markedly low cardiac output and markedly high right and left heart filling pressures.  GDMT titration has been complicated by CKD stage 3b and soft BP.  He has RBBB-like IVCD, have discussed with EP and unlikely to benefit from CRT upgrade.  He was just discharged from a hospitalization for CHF exacerbation. - Repeat echo: EF < 20%, RV severely reduced, moderate BAE, mild to moderate MR - Continues w/ refractory low output/diuretic resistance/dual organ failure despite high dose inotropic support. Remains on DBA 5 mcg. He did not see much benefit on the 7.5 mcg dose. C/w worsening renal failure, SCr now > 4.0 and oliguric.  - GDMT limited by CKD  - He has end-stage (Stage D HF). Not a VAD candidate given renal dysfunction and significant RV failure. He is not responding to inotropic support. Pt now DNR. ICD has been deactivated. Palliative care team following. Suspect transition to comfort today.   2. CAD: s/p CABG x 3 in 10/22. - No s/s angina - Continue atorvastatin  80 mg daily.  - No ASA given stable CAD and apixaban  use.    3. CKD: stage IV.  Follows with nephrology.  Baseline creatinine 2.3   - cardiorenal  - Scr 2.7 -> 2.5 -> 2.3 -> 2.3 -> 2.2 -> 2.3->2.9>>4.0>4.96 - now oliguric    4. Atrial fibrillation: Paroxysmal.  Device interrogation 11/24 shows occasional AF runs, generally short.   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    6. Hyponatremia:  - In setting of massive volume overload - Received tolvaptan  with mild benefit. Corrected Na 124 today   7. Hypokalemia - resolved   Suspect full transition to comfort care later today.   Length of Stay: 9  Beckey LITTIE Coe, NP  04/04/2024, 7:51 AM  Advanced Heart Failure Team Pager [phone] (M-F; 7a - 5p)  Please contact [facility] for night-coverage after hours  (5p -7a ) and weekends on [email or web address]  Patient seen and examined with the above-signed Advanced Practice Provider and/or Housestaff. I personally reviewed laboratory data, imaging studies and relevant notes. I independently examined the patient and formulated the important aspects of the plan. I have edited the note to reflect any of my changes or salient points. I have personally discussed the plan with the patient and/or family.  On DBA. Remains oliguric. Scr climbing. Feels poorly. Agitated. Pulling out IVs  General:  Sitting up in chair. No resp difficulty HEENT: normal Neck: supple. JVD to ear Cor: Regular rate & rhythm. No rubs, gallops or murmurs. Lungs: clear Abdomen: soft, nontender, + distended.Good bowel sounds. Extremities: no cyanosis, clubbing, rash, 1+ edema cool Neuro: alert & orientedx3, cranial nerves grossly intact. moves all 4 extremities w/o difficulty. Affect pleasant  He  is actively dying from end-stage HF and renal failure. He is becoming more uremic. Unfortunately no durable options left. Will likely need full comfort care in next 24 hours or so. Palliative following.   Toribio Fuel, MD  9:43 AM

## 2024-04-04 NOTE — Plan of Care (Signed)
  Problem: Role Relationship: Goal: Family's ability to cope with current situation will improve Outcome: Progressing Note: Family is coping with patient decision to transition

## 2024-04-05 ENCOUNTER — Encounter (HOSPITAL_COMMUNITY): Payer: Self-pay | Admitting: Internal Medicine

## 2024-04-05 ENCOUNTER — Other Ambulatory Visit: Payer: Self-pay

## 2024-04-05 MED ORDER — GLYCOPYRROLATE 0.2 MG/ML IJ SOLN
0.6000 mg | INTRAMUSCULAR | Status: DC | PRN
  Administered 2024-04-05: 0.6 mg via INTRAVENOUS
  Filled 2024-04-05: qty 3

## 2024-04-05 MED ORDER — GLYCOPYRROLATE 0.2 MG/ML IJ SOLN
0.4000 mg | Freq: Once | INTRAMUSCULAR | Status: AC
  Administered 2024-04-05: 0.4 mg via INTRAVENOUS
  Filled 2024-04-05: qty 2

## 2024-04-05 MED ORDER — SCOPOLAMINE 1 MG/3DAYS TD PT72
1.0000 | MEDICATED_PATCH | TRANSDERMAL | Status: DC
  Administered 2024-04-05: 1 mg via TRANSDERMAL
  Filled 2024-04-05: qty 1

## 2024-04-05 MED ORDER — GLYCOPYRROLATE 1 MG PO TABS: 1.0000 mg | ORAL_TABLET | ORAL | Status: DC | PRN

## 2024-04-05 MED ORDER — MIDAZOLAM HCL (PF) 2 MG/2ML IJ SOLN
2.0000 mg | INTRAMUSCULAR | Status: DC | PRN
  Administered 2024-04-05 (×5): 2 mg via INTRAVENOUS
  Filled 2024-04-05 (×5): qty 2

## 2024-04-05 MED ORDER — GLYCOPYRROLATE 0.2 MG/ML IJ SOLN: 0.2000 mg | INTRAMUSCULAR | Status: DC | PRN

## 2024-04-05 NOTE — Progress Notes (Signed)
   04/05/24 1256  Spiritual Encounters  Type of Visit Initial  Care provided to: Patient;Family  Reason for visit End-of-life  OnCall Visit Yes   Chaplain Silvano responds to page and introduces self and services to family members. Wife requests prayer, which chaplain provides. Family denies other needs. Chaplain makes family aware ongoing spiritual support is available.

## 2024-04-05 NOTE — Progress Notes (Signed)
 Advanced Heart Failure Rounding Note  HF Cardiologist: Dr. Rolan  Chief Complaint: HF Subjective:    Off DBA. On full comfort care with dilaudid  at 1/hr  Not awake but gurgling and grunting for air  Objective:   Weight Range: 85 kg Body mass index is 31.18 kg/m.   Vital Signs:   Temp:  [98.7 F (37.1 C)] 98.7 F (37.1 C) (12/06 0500) Pulse Rate:  [69-76] 76 (12/06 0500) Resp:  [18] 18 (12/06 0500) BP: (96-131)/(55-91) 96/55 (12/06 0500) SpO2:  [95 %-100 %] 95 % (12/06 0500) Last BM Date : 04/03/24  Weight change: Filed Weights   04/02/24 0356 04/03/24 0329 04/04/24 0432  Weight: 82.7 kg 83.5 kg 85 kg   Intake/Output:  No intake or output data in the 24 hours ending 04/05/24 1006   Physical Exam   General:  Sitting up in bed. Unresponsive audible gurgling and gruntine HEENT: normal Neck:JVP to ear Cor: Regular  Lungs: diffuse rhonchi gurgling Abdomen: ++ distended. Extremities: no cyanosis, clubbing, rash, 2+ edema Neuro:unresponsive  Labs  CBC No results for input(s): WBC, NEUTROABS, HGB, HCT, MCV, PLT in the last 72 hours.  Basic Metabolic Panel Recent Labs    87/95/74 0559 04/04/24 0425  NA 122* 122*  K 3.5 4.2  CL 85* 79*  CO2 25 27  GLUCOSE 195* 189*  BUN 56* 66*  CREATININE 4.03* 4.96*  CALCIUM  7.7* 8.4*   Liver Function Tests No results for input(s): AST, ALT, ALKPHOS, BILITOT, PROT, ALBUMIN  in the last 72 hours.  No results for input(s): LIPASE, AMYLASE in the last 72 hours. Cardiac Enzymes No results for input(s): CKTOTAL, CKMB, CKMBINDEX, TROPONINI in the last 72 hours.  BNP: BNP (last 3 results) Recent Labs    02/01/24 1118 02/09/24 0921 03/26/24 1455  BNP 941.8* 1,871.3* 2,129.6*   ProBNP (last 3 results) Recent Labs    03/19/24 1038 03/25/24 1249  PROBNP 19,467.0* 20,964.0*   D-Dimer No results for input(s): DDIMER in the last 72 hours. Hemoglobin A1C No results for  input(s): HGBA1C in the last 72 hours. Fasting Lipid Panel No results for input(s): CHOL, HDL, LDLCALC, TRIG, CHOLHDL, LDLDIRECT in the last 72 hours. Thyroid Function Tests No results for input(s): TSH, T4TOTAL, T3FREE, THYROIDAB in the last 72 hours.  Invalid input(s): FREET3  Other results:  Imaging   No results found.   Medications:   Scheduled Medications:    Infusions:  HYDROmorphone  1 mg/hr (04/05/24 0806)    PRN Medications: acetaminophen  **OR** acetaminophen , albuterol , antiseptic oral rinse, artificial tears, bisacodyl , dextromethorphan -guaiFENesin , glycopyrrolate  **OR** glycopyrrolate  **OR** glycopyrrolate , HYDROmorphone , LORazepam  **OR** [DISCONTINUED] LORazepam  **OR** LORazepam , methocarbamol , ondansetron  **OR** ondansetron  (ZOFRAN ) IV  Assessment/Plan   1. Acute on chronic biventricular heart failure: Ischemic cardiomyopathy.  Echo (5/23) with EF 30-35%, severe LV dilation, mild MR, low normal RV systolic function. He has a Medtronic ICD and a cardiac contractility modulator. Echo in 7/24 showed EF 20-25% with mild RV dysfunction.  Echo in 11/25 (done at [facility] so cannot review) showed EF 25%, mild RV dysfunction, moderate MR.  He had a RHC in 10/25 with markedly low cardiac output and markedly high right and left heart filling pressures.  GDMT titration has been complicated by CKD stage 3b and soft BP.  He has RBBB-like IVCD, have discussed with EP and unlikely to benefit from CRT upgrade.  He was just discharged from a hospitalization for CHF exacerbation. - Repeat echo: EF < 20%, RV severely reduced, moderate BAE, mild to moderate  MR - He has end-stage (Stage D HF). Not a VAD candidate given renal dysfunction and significant RV failure. He is not responding to inotropic support. Pt now DNR. ICD has been deactivated. Palliative care team following.  - Now on full comfort care - He appears uncomfortable with air hunger. Getting dilaudid   pushes on top of drip. Increase drip to 3. D/w family at bedside  Length of Stay: 10  Toribio Fuel, MD  04/05/2024, 10:06 AM  Advanced Heart Failure Team Pager [phone] (M-F; 7a - 5p)  Please contact [facility] for night-coverage after hours (5p -7a ) and weekends on [email or web address]   10:06 AM

## 2024-04-05 NOTE — Progress Notes (Signed)
 Palliative Medicine Progress Note   Patient Name: Jermaine Kramer       Date: 04/05/2024 DOB: 1951-02-04  Age: 73 y.o. MRN#: [id number] Attending Physician: Cherrie Toribio SAUNDERS, MD Primary Care Physician: Perri Constance Sor, PA-C Admit Date: 03/26/2024   HPI/Patient Profile: 73 y.o. male  with past medical history of CAD s/p CABG x3 (2022), atrial fibrillation, CKD, HFrEF (<20%) admitted on 03/26/2024 with acute on chronic biventricular heart failure with low-output.    Per H&P on 11/26 by Bensimhon MD patient presented to heart failure clinic on 11/24 after discharge from [facility] (11/19-11/24) with volume overload. Patient had a recent right heart cath that is concerning for end stage heart failure with consideration for LVAD. Patient has had about 4 admissions in the last 2 months for congestive heart failure.   Per progress note on 12/01 by Bensimhon MD after days of diuresing as well as inotrope adjustment from milrinone  to dobutamine  without much improvement in patient's fatigue and shortness of breath along with patient's elevated creatinine remains >2 precludes patient from further advanced therapies such as LVAD. Plan to continue to titrate dobutamine  but most likely path for patient currently is palliative dobutamine  vs hospice.    Palliative medicine consulted for goals of care conversation.   Subjective: Chart reviewed. Patient assessed. He currently appears comfortable. Dilaudid  infusion at 3 mg/hr. He is unresponsive to voice. No non-verbal signs of pain or discomfort noted. Respirations are even and unlabored. He continues to have excessive respiratory secretions noted, despite several doses of robinul  given in the last few hours, last at 10:50 .   Multiple family members present at  bedside. Emotional support provided. Reviewed natural trajectory at EOL. Removed NRB mask and transitioned to nasal cannula at 7 L.    RN later contacted me requesting increased symptom medications. Advised RN to first administer bolus doses of dilaudid  via the infusion, then increasing rate if needed. Also added scopolamine  patch and ordered versed  q1h as needed.   Objective:  Physical Exam Vitals reviewed.  Constitutional:      General: He is not in acute distress.    Appearance: He is ill-appearing.  Pulmonary:     Effort: No respiratory distress.  Neurological:     Mental Status: He is unresponsive.  Palliative Medicine Assessment & Plan   Assessment: Principal Problem:   Acute on chronic systolic heart failure (HCC)    Recommendations/Plan: Continue comfort measures Continue dilaudid  gtt - for signs of discomfort, administer bolus doses via the pump, then increase rate if needed Robinul  0.4 mg IV now x 1 dose, increase to 0.6 mg every 4 hours as needed  Start scopolamine  patch PMT will continue to follow  Code Status: DNR - comfort  Prognosis:  Hours - Days  Discharge Planning: Anticipated Hospital Death   Thank you for allowing the Palliative Medicine Team to assist in the care of this patient.   Billing based on MDM: High  Problems Addressed: One acute or chronic illness or injury that poses a threat to life or bodily function  Amount and/or Complexity of Data: Category 1:Review of prior external note(s) from each unique source, Review of the result(s) of each unique test, and Assessment requiring an independent historian(s)  Risks: Parenteral controlled substances    Lorann Tani B Linas Stepter, NP   Please contact Palliative Medicine Team phone at [phone] for questions and concerns.  For individual providers, please see AMION.

## 2024-04-05 NOTE — Progress Notes (Signed)
 Chaplain responds to page and introduces self and services to family members. Pt's wife Arland says she's met chaplain before. Pt resting and appears comfortable. Several family members are present. Wife requests prayer, which chaplain provides. Family denies other needs. Chaplain makes family aware ongoing spiritual support is available.

## 2024-04-06 ENCOUNTER — Ambulatory Visit: Payer: Self-pay | Admitting: Cardiology

## 2024-04-07 ENCOUNTER — Telehealth: Payer: Self-pay | Admitting: Cardiology

## 2024-04-07 NOTE — Telephone Encounter (Signed)
 Per our Device clinic CMA, Danette- she called the patient's wife and she is insistent on bringing the patient's transmitter back to the [place] office.

## 2024-04-07 NOTE — Telephone Encounter (Signed)
 Wife is following up requesting a call back regarding this matter. Please advise.

## 2024-04-07 NOTE — Telephone Encounter (Signed)
 Wife would like to know what to do with ICD monitor since patient passed away. Please advise.

## 2024-04-17 ENCOUNTER — Ambulatory Visit

## 2024-05-01 NOTE — Death Summary Note (Signed)
  Advanced Heart Failure Death Summary  Death Summary   Patient ID: Jermaine Kramer MRN: [id number], DOB/AGE: [birth year] 74 y.o. Admit date: 03/26/2024 D/C date:     20-Apr-2024   Primary Discharge Diagnoses:  Cardiogenic shock  Hospital Course:   74 y.o. M with history of CAD, paroxysmal atrial fibrillation, CKD, and ischemic cardiomyopathy/chronic HFrEF.    Patient had CABG x 3 in 10/22. LV function has remained low since that time, ischemic CMP.  Echo in 5/23 showed EF 30-35%, severe LV dilation, mild MR, low normal RV systolic function. He has a Medtronic ICD. He has paroxysmal atrial fibrillation but is generally in NSR. He has struggled with volume overload. This is complicated by CKD stage 3b. He follows with nephrology, Dr. Marcelino.    Echo in 7/24 showed EF 20-25% with mild RV dysfunction.  He had cardiac contractility modulator placed in 5/24 by Dr. Cindie.  Echo in 11/25 (done at [facility]) showed EF 25%, mild RV dysfunction, moderate MR.    [name] has done poorly recently.  He had RHC done through [facility] clinic in 10/25 showing mean RA 22, PA 54/29, mean PCWP 27, and CI 1.39.  He went to the ER at [facility] in 11/25 due to peripheral edema/volume overload with NYHA class IIIb-IV symptoms.  He was admitted from 11/19-24, received IV Lasix  and placed on midodrine  for low blood pressure. Creatinine was 2.12 on the day of discharge.     He was seen in our [facility] 03/24/24 (day after discharge). He was markedly volume overloaded w/ NYHA IIIb-IV symptoms. He was direct admitted to [facility] for IV diuresis and inotropic support. Given markedly low cardiac output on recent RHC there was concern that he was nearing end-stage and may require advanced therapies.   He was initially placed on milrinone  + lasix  gtt. Milrinone  later stopped and he was switched to DBA d/t hypotension. Repeat echo: EF < 20%, RV severely reduced, moderate BAE, mild to moderate MR. Unfortunately, he did not respond to  inotropic support. DBA was titrated up to 7.5 mcg/kg/min but c/w refractory low output w/ Co-ox persistently in the 40s and refractory volume overload w/ worsening, anuric, renal failure. Scr gradually rose to > 4.0. Given dual organ failure + significant RV dysfunction, no longer a candidate for LVAD.  After GOC discussion, pt opted to transition to comfort care. He was made DNR/DNI. His ICD was deactivated.    Passed peacefully on 04-20-2024.     Duration of Discharge Encounter: 15 minutes  Signed, Morene JINNY Brownie, MD 04/20/2024, 12:42 PM

## 2024-05-01 DEATH — deceased

## 2024-05-14 ENCOUNTER — Encounter: Admitting: Family

## 2024-06-25 ENCOUNTER — Ambulatory Visit: Admitting: Family
